# Patient Record
Sex: Male | Born: 1950 | Race: Black or African American | Hispanic: No | Marital: Married | Smoking: Never smoker
Health system: Southern US, Community
[De-identification: ages and names within clinical notes are randomized; demographics above are authoritative.]

## PROBLEM LIST (undated history)

## (undated) DIAGNOSIS — K219 Gastro-esophageal reflux disease without esophagitis: Secondary | ICD-10-CM

## (undated) DIAGNOSIS — D631 Anemia in chronic kidney disease: Secondary | ICD-10-CM

## (undated) DIAGNOSIS — G8929 Other chronic pain: Secondary | ICD-10-CM

## (undated) DIAGNOSIS — I5020 Unspecified systolic (congestive) heart failure: Secondary | ICD-10-CM

## (undated) DIAGNOSIS — N189 Chronic kidney disease, unspecified: Secondary | ICD-10-CM

## (undated) DIAGNOSIS — M549 Dorsalgia, unspecified: Secondary | ICD-10-CM

## (undated) DIAGNOSIS — I10 Essential (primary) hypertension: Secondary | ICD-10-CM

## (undated) DIAGNOSIS — N186 End stage renal disease: Secondary | ICD-10-CM

## (undated) DIAGNOSIS — M109 Gout, unspecified: Secondary | ICD-10-CM

## (undated) DIAGNOSIS — I509 Heart failure, unspecified: Secondary | ICD-10-CM

## (undated) DIAGNOSIS — M1990 Unspecified osteoarthritis, unspecified site: Secondary | ICD-10-CM

## (undated) HISTORY — DX: Unspecified systolic (congestive) heart failure: I50.20

## (undated) HISTORY — DX: End stage renal disease: N18.6

## (undated) HISTORY — DX: Anemia in chronic kidney disease: N18.9

## (undated) HISTORY — DX: Chronic kidney disease, unspecified: D63.1

---

## 1998-07-12 ENCOUNTER — Encounter: Payer: Self-pay | Admitting: Nephrology

## 1998-07-12 ENCOUNTER — Ambulatory Visit (HOSPITAL_COMMUNITY): Admission: RE | Admit: 1998-07-12 | Discharge: 1998-07-12 | Payer: Self-pay | Admitting: Nephrology

## 1998-09-05 ENCOUNTER — Ambulatory Visit (HOSPITAL_COMMUNITY): Admission: RE | Admit: 1998-09-05 | Discharge: 1998-09-05 | Payer: Self-pay | Admitting: Nephrology

## 1998-09-05 ENCOUNTER — Encounter: Payer: Self-pay | Admitting: Nephrology

## 1998-11-22 ENCOUNTER — Encounter: Payer: Self-pay | Admitting: Nephrology

## 1998-11-22 ENCOUNTER — Ambulatory Visit (HOSPITAL_COMMUNITY): Admission: RE | Admit: 1998-11-22 | Discharge: 1998-11-22 | Payer: Self-pay | Admitting: Nephrology

## 1999-01-03 ENCOUNTER — Ambulatory Visit (HOSPITAL_COMMUNITY): Admission: RE | Admit: 1999-01-03 | Discharge: 1999-01-03 | Payer: Self-pay | Admitting: Nephrology

## 1999-01-03 ENCOUNTER — Encounter: Payer: Self-pay | Admitting: Nephrology

## 1999-01-21 ENCOUNTER — Ambulatory Visit (HOSPITAL_COMMUNITY): Admission: RE | Admit: 1999-01-21 | Discharge: 1999-01-21 | Payer: Self-pay

## 1999-01-25 ENCOUNTER — Ambulatory Visit (HOSPITAL_COMMUNITY): Admission: RE | Admit: 1999-01-25 | Discharge: 1999-01-26 | Payer: Self-pay

## 1999-01-27 ENCOUNTER — Emergency Department (HOSPITAL_COMMUNITY): Admission: EM | Admit: 1999-01-27 | Discharge: 1999-01-27 | Payer: Self-pay | Admitting: Emergency Medicine

## 1999-05-03 ENCOUNTER — Encounter: Payer: Self-pay | Admitting: Nephrology

## 1999-05-03 ENCOUNTER — Ambulatory Visit (HOSPITAL_COMMUNITY): Admission: RE | Admit: 1999-05-03 | Discharge: 1999-05-03 | Payer: Self-pay | Admitting: Nephrology

## 1999-07-15 ENCOUNTER — Encounter: Payer: Self-pay | Admitting: Nephrology

## 1999-07-15 ENCOUNTER — Ambulatory Visit (HOSPITAL_COMMUNITY): Admission: RE | Admit: 1999-07-15 | Discharge: 1999-07-15 | Payer: Self-pay | Admitting: Nephrology

## 1999-07-17 ENCOUNTER — Ambulatory Visit (HOSPITAL_COMMUNITY): Admission: RE | Admit: 1999-07-17 | Discharge: 1999-07-17 | Payer: Self-pay | Admitting: Nephrology

## 1999-09-23 ENCOUNTER — Encounter: Admission: RE | Admit: 1999-09-23 | Discharge: 1999-09-23 | Payer: Self-pay | Admitting: Nephrology

## 1999-09-23 ENCOUNTER — Encounter: Payer: Self-pay | Admitting: Nephrology

## 2000-03-16 ENCOUNTER — Encounter: Payer: Self-pay | Admitting: Nephrology

## 2000-03-16 ENCOUNTER — Encounter: Admission: RE | Admit: 2000-03-16 | Discharge: 2000-03-16 | Payer: Self-pay | Admitting: Nephrology

## 2000-05-31 ENCOUNTER — Encounter: Payer: Self-pay | Admitting: Nephrology

## 2000-05-31 ENCOUNTER — Ambulatory Visit (HOSPITAL_COMMUNITY): Admission: RE | Admit: 2000-05-31 | Discharge: 2000-05-31 | Payer: Self-pay | Admitting: Nephrology

## 2001-01-08 ENCOUNTER — Encounter: Admission: RE | Admit: 2001-01-08 | Discharge: 2001-01-08 | Payer: Self-pay | Admitting: Nephrology

## 2001-01-08 ENCOUNTER — Encounter: Payer: Self-pay | Admitting: Nephrology

## 2002-01-12 ENCOUNTER — Encounter: Admission: RE | Admit: 2002-01-12 | Discharge: 2002-01-12 | Payer: Self-pay | Admitting: Nephrology

## 2002-01-12 ENCOUNTER — Encounter: Payer: Self-pay | Admitting: Nephrology

## 2002-06-21 ENCOUNTER — Encounter: Admission: RE | Admit: 2002-06-21 | Discharge: 2002-06-21 | Payer: Self-pay | Admitting: Nephrology

## 2002-06-21 ENCOUNTER — Encounter: Payer: Self-pay | Admitting: Nephrology

## 2004-10-09 ENCOUNTER — Encounter: Admission: RE | Admit: 2004-10-09 | Discharge: 2004-10-09 | Payer: Self-pay | Admitting: Nephrology

## 2004-10-09 IMAGING — CR DG CHEST 2V
2 series · 2 of 2 positions shown · non-contrast
Comparison: none

CLINICAL DATA: Short of breath.  
 CHEST X-RAY: 
 Two views of the chest are compared to a chest x-ray of [DATE].  The lungs are clear.  A nodular area in the left suprahilar region was present previously and appears to represent overlapping vasculature.  Mild cardiomegaly is stable.  There are degenerative changes throughout the thoracic spine.

[w chest pa]
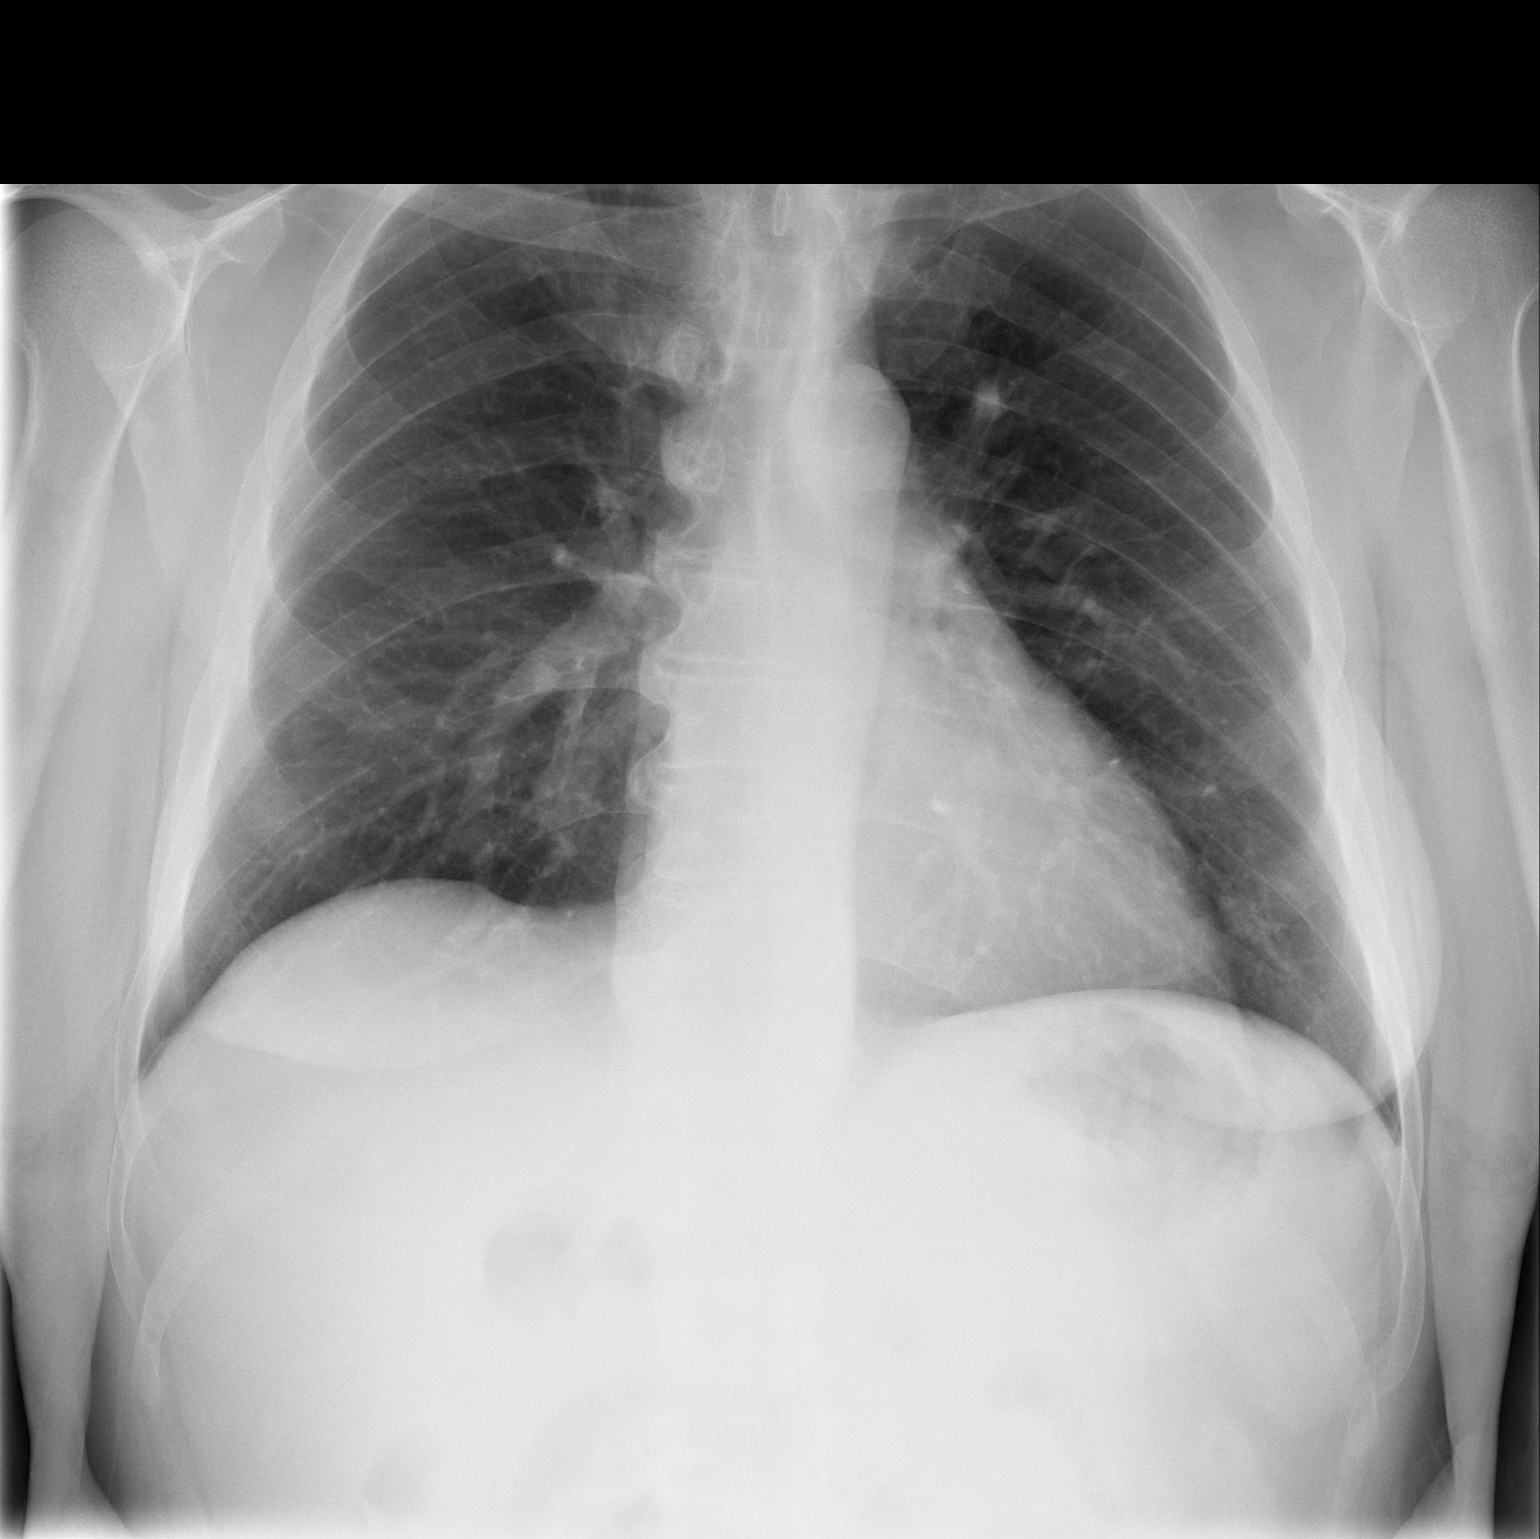

[w chest lat]
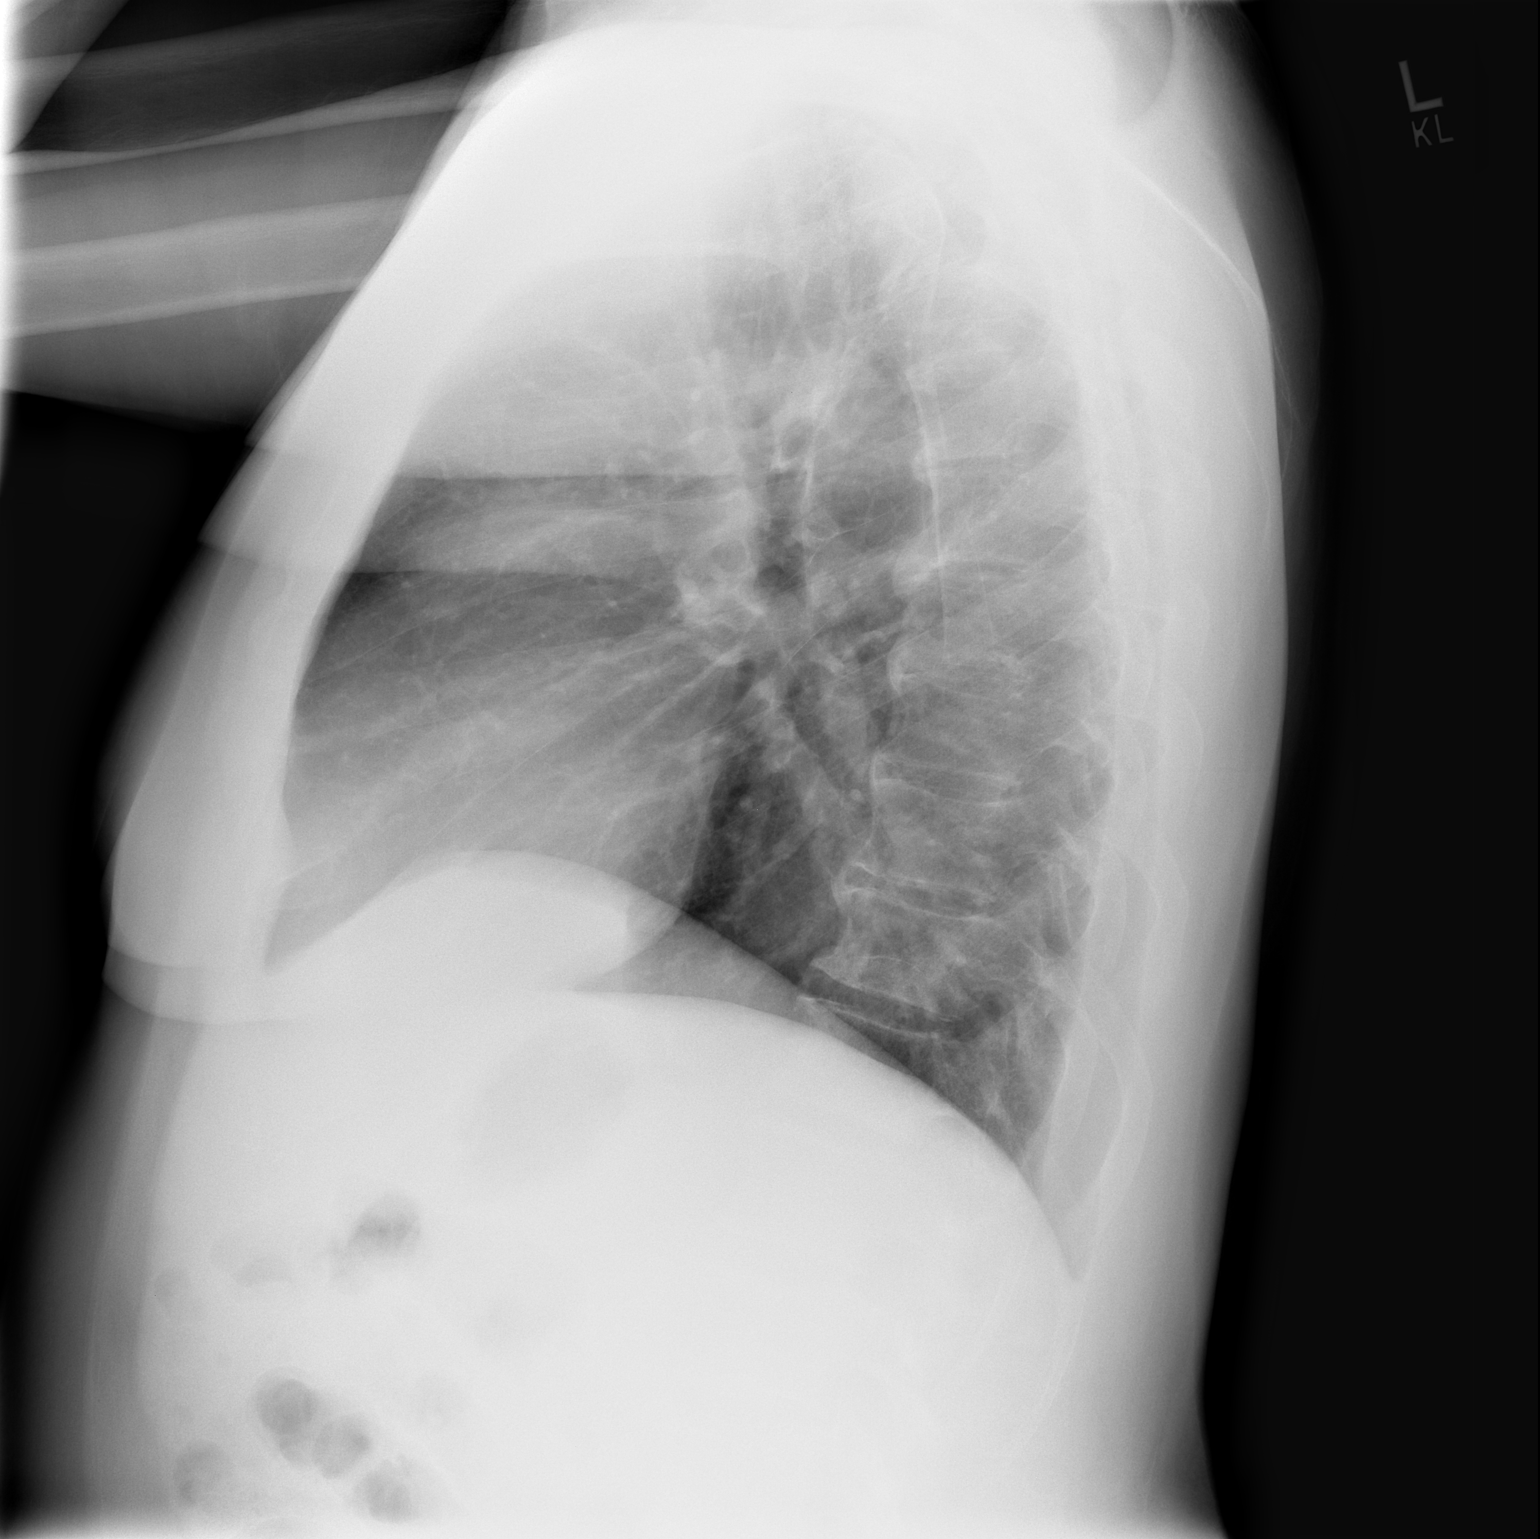

[2 of 2 positions shown; findings below may reference images not displayed]

IMPRESSION: Stable chest x-ray.  Mild cardiomegaly.  No active lung disease.

## 2005-08-08 ENCOUNTER — Ambulatory Visit (HOSPITAL_COMMUNITY): Admission: RE | Admit: 2005-08-08 | Discharge: 2005-08-08 | Payer: Self-pay | Admitting: Nephrology

## 2007-12-06 ENCOUNTER — Encounter: Admission: RE | Admit: 2007-12-06 | Discharge: 2007-12-06 | Payer: Self-pay | Admitting: Nephrology

## 2008-06-27 ENCOUNTER — Ambulatory Visit: Payer: Self-pay | Admitting: Vascular Surgery

## 2009-07-09 ENCOUNTER — Ambulatory Visit: Payer: Self-pay | Admitting: Surgery

## 2009-07-26 ENCOUNTER — Ambulatory Visit: Payer: Self-pay | Admitting: Vascular Surgery

## 2009-07-26 ENCOUNTER — Ambulatory Visit (HOSPITAL_COMMUNITY): Admission: RE | Admit: 2009-07-26 | Discharge: 2009-07-26 | Payer: Self-pay | Admitting: Surgery

## 2009-07-26 HISTORY — PX: AV FISTULA PLACEMENT: SHX1204

## 2009-08-20 ENCOUNTER — Ambulatory Visit: Payer: Self-pay | Admitting: Surgery

## 2009-09-10 ENCOUNTER — Ambulatory Visit: Payer: Self-pay | Admitting: Surgery

## 2010-05-26 ENCOUNTER — Encounter: Payer: Self-pay | Admitting: Nephrology

## 2010-07-29 LAB — POCT I-STAT 4, (NA,K, GLUC, HGB,HCT)
Glucose, Bld: 98 mg/dL (ref 70–99)
Hemoglobin: 17 g/dL (ref 13.0–17.0)

## 2010-09-17 NOTE — Assessment & Plan Note (Signed)
OFFICE VISIT   TRI, NOWLING  DOB:  [birth year]                                       08/20/2009  [id number]   REASON FOR VISIT:  Follow-up.   HISTORY:  The patient is a 60 year old gentleman who underwent right  upper arm fistula by Dr. Donnetta Hutching on July 26, 2009.  He comes today for  first follow-up.  He is complaining of some symptoms at night related to  the coldness and pain which can wake him up.   On examination his vein is a very dilated.  There is excellent thrill  within it.  He has palpable radial pulse.   ASSESSMENT:  Mild right arm steal.   PLAN:  I have encouraged the patient to use exercise ball and we will  follow him and see how it looks in 3 weeks.  He will come back sooner if  he has any other problems.     Eldridge Abrahams, MD  Electronically Signed   VWB/MEDQ  D:  08/20/2009  T:  08/21/2009  Job:  [id number]

## 2010-09-17 NOTE — Assessment & Plan Note (Signed)
OFFICE VISIT   Gregory [name], Gregory [name]  DOB:  08-14-1950                                       07/09/2009  [id number]   REASON FOR VISIT:  Dialysis access.   REFERRING PHYSICIAN:  Dr. Lorrene Reid.   Patient is a 60 year old right-handed gentleman, not yet on dialysis  with chronic renal insufficiency who is sent to my office for evaluation  of permanent access, and he anticipates starting dialysis in the near  future.  The patient's renal failure is secondary to hypertension.  This  has been medically managed and has been under good control recently.  He  also suffers from hyperlipidemia, secondary hyperparathyroidism, status  post parathyroidectomy, gout, all of which are medically managed.   Review of systems is only positive for arthritis and joint pain.  All  others negative, as documented in the encounter form.   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   FAMILY HISTORY:  Negative for cardiovascular disease at an early age.   SOCIAL HISTORY:  Married with 2 children.  He is retired.  Does not  smoke, does not drink.   ALLERGIES:  None.   PHYSICAL EXAMINATION:  VITAL SIGNS:  Heart rate 57, blood pressure  131/82, temperature is 98.2.  Generally, he is well-appearing in no  distress.  HEENT:  Within normal limits.  Lungs are clear bilaterally.  Cardiovascular:  Regular rate and rhythm.  No murmurs.  Abdomen is soft.  Musculoskeletal is without major deformity.  Neuro is nonfocal.  Skin is  without rash.   DIAGNOSTICS:  Vein mapping was performed today, which has been  independently reviewed.   ASSESSMENT:  Chronic kidney disease, needing permanent access.   PLAN:  Based on the patient's vein mapping, he is a candidate for a  right upper arm fistula.  I have discussed the  risks of non maturity and  the risks of steal with the patient.  He understands these risks.  We  have scheduled his operation for Thursday, March 24th.     Eldridge Abrahams, MD  Electronically Signed   VWB/MEDQ  D:  07/09/2009  T:  07/10/2009  Job:  [id number]   cc:   Elzie Rings. Lorrene Reid, M.D.

## 2010-09-17 NOTE — Consult Note (Signed)
NEW PATIENT CONSULTATION   [name], [name]  DOB:  02/23/1951                                       06/27/2008  [id number]   I saw the patient in the office today in consultation concerning  bilateral common iliac artery aneurysms.  He was referred by Dr. Lorrene Reid.  This is a pleasant 60 year old gentleman with a history of chronic  kidney disease.  He is followed by Dr. Lorrene Reid.  He is not on dialysis.  He had an abdominal ultrasound in June of 2009 to evaluate his renal  arteries and an incidental finding was mildly dilated iliac arteries.  Right iliac measured 1.7 cm in maximum diameter and the left iliac  measured 1.9 cm in maximum diameter.  The renal arteries had normal  patency.  Of note, he is unaware of any family history of aneurysmal  disease.  He has had no significant abdominal pain.  He does have some  chronic low back pain.  In addition, he has had leg pain for years that  is really not changed in character.  He described a burning and stabbing  pain all over his thighs and calves.  This pain oftentimes occurs simply  with standing but does not occur every day.  I do not get any clear-cut  history of claudication, rest pain or nonhealing ulcers.  The only  alleviating factor associated with his pain is taking pain medicines.  He does feel that walking does sometimes aggravate his pain.   PAST MEDICAL HISTORY:  Is significant for chronic kidney disease.  In  addition, he has hypertension, hypercholesterolemia, secondary  hyperparathyroidism, a history of gout with gouty arthritis.  He denies  any previous history of myocardial infarction, history of congestive  heart failure or history of COPD.   FAMILY HISTORY:  There is no history of aneurysmal disease or premature  cardiovascular disease.   SOCIAL HISTORY:  He is married.  He has two children.  He is retired.  He does not use tobacco.   REVIEW OF SYSTEMS AND MEDICATIONS:  Are documented  on the medical  history form in his chart.   PHYSICAL EXAMINATION:  General:  This is a pleasant 60 year old  gentleman who appears his stated age.  Vital signs:  Blood pressure is  183/133, heart rate is 90.  HEENT:  Is unremarkable.  Neck:  Is supple.  There is no cervical lymphadenopathy.  I do not detect any carotid  bruits.  Lungs:  Are clear bilaterally to auscultation.  Cardiac:  He  has a regular rate and rhythm.  Abdomen:  Soft and nontender.  His aorta  is palpable and does not appear especially enlarged.  He has palpable  femoral, popliteal, dorsalis pedis and posterior tibial pulses  bilaterally.  He has no evidence of atheroembolic disease.  He has no  significant lower extremity swelling.  Neurological:  Exam is nonfocal.   Duplex scan in our office today shows no evidence of an abdominal aortic  aneurysm.  His right iliac artery is mildly dilated at 2.1 cm and the  left iliac is 1.9 cm.  Thus his iliacs have not changed significantly in  size since June of 2009.   I have explained we generally would not consider elective repair of an  iliac aneurysm unless it was least 3 to 3.5 cm in  maximum diameter.  I  have simply recommended followup.   I have recommended a followup ultrasound in 6 months and will continue  to follow these small aneurysms.  If the aneurysms do begin to enlarge  then a CT scan without contrast might help further evaluate the  aneurysms.  However, currently they are quite small and I do not think a  more aggressive workup is indicated at this time.  These are  asymptomatic.  He has no evidence of atheroembolic disease.  I did  explain to him that I did not think his leg pain was related to a  vascular problem as he has excellent pulses.  He does have significant  back pain and if this becomes more of a problem perhaps neurologic  evaluation would be helpful.  I will see him back in 6 months.  He knows  to call sooner if he has problems.    Judeth Cornfield. Scot Dock, M.D.  Electronically Signed   CSD/MEDQ  D:  06/27/2008  T:  06/28/2008  Job:  [id number]   cc:   Elzie Rings. Lorrene Reid, M.D.

## 2010-09-17 NOTE — Procedures (Signed)
CEPHALIC VEIN MAPPING   INDICATION:  Evaluation for AVF or AVG placement.   HISTORY:  Renal failure   EXAM:  The right cephalic vein is compressible.  Diameter measurements range  from 0.14 to 0.31 cm.   The right basilic vein is compressible.  Diameter measurements range  from 0.16 to 0.26 cm in the upper arm only.   The left cephalic vein is compressible.  Diameter measurements range  from 0.11 to 0.21 cm.   The left basilic vein is compressible.  Diameter measurements range from  0.21 to 0.37 cm in the upper arm only.   See attached worksheet for all measurements.   IMPRESSION:  The patient's bilateral basilic and cephalic veins are of  questionable diameter for use as dialysis access sites.     ___________________________________________  V. Leia Alf, MD   CJ/MEDQ  D:  07/09/2009  T:  07/09/2009  Job:  [id number]

## 2010-09-17 NOTE — Assessment & Plan Note (Signed)
OFFICE VISIT   CHESTER, COUPLAND  DOB:  [birth year]                                       09/10/2009  [id number]   The patient is a 60 year old gentleman who underwent right upper arm  fistula by Dr. Donnetta Hutching on March 24.  When I saw him at his first follow-up  visit, he had some pain symptoms at night that did wake him up.  He did  not have a significant strength discrepancy and he had good function of  his right hand.  There was an excellent thrill within his fistula and  had a palpable radial pulse..  I had encouraged that he use an exercise  ball and see me back today.  Today he says that the pain is worse, that  he is having to take aspirin for it which gives him mild to minimal  relief.  It does keep him up occasionally at night.  His grip strength  in his right hand is significantly decreased.  The right hand is cooler  than the left.  He does continue to have a palpable radial pulse.  There  is a very strong thrill within his fistula.   I spent approximately 20 minutes detailing the significance and severity  of this problem to the patient.  We discussed proceeding with 1 of 3  options including ligation of his fistula, banding of the proximal  anastomosis and a DRIL procedure.  The patient told me that he does not  like hospitals and that he is very reluctant to have anything done at  this time.  I have reiterated that this could potentially result in  permanent loss of function in his hand.  He is still somewhat reluctant  to proceed.  He wants to discuss  this at home with his wife.  I told  him again and again that this may result in a permanent disability in  his hand and that we need to  address this issue this week.  I am going  to get an ultrasound to document his steal syndrome and I will contact  him to make sure that we get an operation scheduled.     Eldridge Abrahams, MD  Electronically Signed   VWB/MEDQ  D:  09/10/2009  T:   09/11/2009  Job:  [id number]   cc:   Elzie Rings. Lorrene Reid, M.D.

## 2010-09-17 NOTE — Procedures (Signed)
VASCULAR LAB EXAM   INDICATION:  Possible right AVF steal.  The patient complains of right  hand numbness.   HISTORY:  Diabetes.  No.  Cardiac:  No.  Hypertension:  No.   EXAM:  Right AV fistula duplex.   IMPRESSION:  1. Patent right brachiocephalic arteriovenous fistula with an      increased velocity of 435 cm/s noted at the mid brachium level      outflow vein.  This appears to be due to a valve leaflet.  2. Patent branch of the right outflow vein at the distal brachium      level measuring 0.53 cm.  3. Doppler velocities of the right forearm arteries, with and without      fistula compression, suggests possible significant fistula steal.      Dampened right upper extremity arterial waveforms noted distal to      the anastomosis level.      ___________________________________________  V. Leia Alf, MD   CH/MEDQ  D:  09/10/2009  T:  09/10/2009  Job:  [id number]

## 2010-09-17 NOTE — Procedures (Signed)
DUPLEX ULTRASOUND OF ABDOMINAL AORTA   INDICATION:  Follow-up evaluation of bilateral iliac artery aneurysms.   HISTORY:  Diabetes:  No.  Cardiac:  No.  Hypertension:  No.  Smoking:  No.  Connective Tissue Disorder:  Family History:  Previous Surgery:  On October 04, 2007, duplex revealed the right iliac  artery to measure 1.7 cm x 1.6 cm and the left iliac artery measured 1.9  cm x 1.5 cm.   DUPLEX EXAM:         AP (cm)                   TRANSVERSE (cm)  Proximal             1.5 cm                    1.9 cm  Mid                  2.0 cm                    2.1 cm  Distal               1.9 cm                    2.5 cm  Right Iliac          2.1 cm                    1.9 cm  Left Iliac           1.8 cm                    1.9 cm   PREVIOUS:  Date:  AP:  TRANSVERSE:   IMPRESSION:  1. Bilateral common iliac artery aneurysms.  2. No evidence of significant aortic dilation.  3. No evidence of aortic thrombus or occlusive disease.  4. The left common iliac artery is dilated for approximately 10 cm of      length.  5. The right common iliac artery is dilated for approximately 8 cm of      length.   ___________________________________________  Judeth Cornfield. Scot Dock, M.D.   MC/MEDQ  D:  06/27/2008  T:  06/27/2008  Job:  [id number]

## 2011-05-09 DIAGNOSIS — R809 Proteinuria, unspecified: Secondary | ICD-10-CM | POA: Diagnosis not present

## 2011-05-16 DIAGNOSIS — M2540 Effusion, unspecified joint: Secondary | ICD-10-CM | POA: Diagnosis not present

## 2011-05-16 DIAGNOSIS — R809 Proteinuria, unspecified: Secondary | ICD-10-CM | POA: Diagnosis not present

## 2011-05-29 DIAGNOSIS — N184 Chronic kidney disease, stage 4 (severe): Secondary | ICD-10-CM | POA: Diagnosis not present

## 2011-07-01 DIAGNOSIS — R809 Proteinuria, unspecified: Secondary | ICD-10-CM | POA: Diagnosis not present

## 2011-07-01 DIAGNOSIS — I10 Essential (primary) hypertension: Secondary | ICD-10-CM | POA: Diagnosis not present

## 2011-07-03 ENCOUNTER — Other Ambulatory Visit: Payer: Self-pay | Admitting: Nephrology

## 2011-07-03 DIAGNOSIS — R809 Proteinuria, unspecified: Secondary | ICD-10-CM

## 2011-07-03 DIAGNOSIS — I10 Essential (primary) hypertension: Secondary | ICD-10-CM

## 2011-07-07 ENCOUNTER — Other Ambulatory Visit: Payer: Self-pay

## 2011-07-07 ENCOUNTER — Ambulatory Visit
Admission: RE | Admit: 2011-07-07 | Discharge: 2011-07-07 | Disposition: A | Discharge status: Home / Self Care | Payer: Medicare Other | Source: Ambulatory Visit | Attending: Nephrology | Admitting: Nephrology

## 2011-07-07 DIAGNOSIS — R809 Proteinuria, unspecified: Secondary | ICD-10-CM

## 2011-07-07 DIAGNOSIS — I10 Essential (primary) hypertension: Secondary | ICD-10-CM

## 2011-07-07 DIAGNOSIS — N189 Chronic kidney disease, unspecified: Secondary | ICD-10-CM | POA: Diagnosis not present

## 2011-07-07 DIAGNOSIS — N183 Chronic kidney disease, stage 3 unspecified: Secondary | ICD-10-CM

## 2011-07-07 DIAGNOSIS — N281 Cyst of kidney, acquired: Secondary | ICD-10-CM | POA: Diagnosis not present

## 2011-08-26 DIAGNOSIS — I10 Essential (primary) hypertension: Secondary | ICD-10-CM | POA: Diagnosis not present

## 2011-08-26 DIAGNOSIS — N184 Chronic kidney disease, stage 4 (severe): Secondary | ICD-10-CM | POA: Diagnosis not present

## 2011-08-26 DIAGNOSIS — I129 Hypertensive chronic kidney disease with stage 1 through stage 4 chronic kidney disease, or unspecified chronic kidney disease: Secondary | ICD-10-CM | POA: Diagnosis not present

## 2011-08-26 DIAGNOSIS — N2581 Secondary hyperparathyroidism of renal origin: Secondary | ICD-10-CM | POA: Diagnosis not present

## 2011-08-26 DIAGNOSIS — R809 Proteinuria, unspecified: Secondary | ICD-10-CM | POA: Diagnosis not present

## 2011-08-26 DIAGNOSIS — N049 Nephrotic syndrome with unspecified morphologic changes: Secondary | ICD-10-CM | POA: Diagnosis not present

## 2011-10-22 DIAGNOSIS — E785 Hyperlipidemia, unspecified: Secondary | ICD-10-CM | POA: Diagnosis not present

## 2011-10-22 DIAGNOSIS — N2581 Secondary hyperparathyroidism of renal origin: Secondary | ICD-10-CM | POA: Diagnosis not present

## 2011-10-22 DIAGNOSIS — I10 Essential (primary) hypertension: Secondary | ICD-10-CM | POA: Diagnosis not present

## 2011-10-22 DIAGNOSIS — R809 Proteinuria, unspecified: Secondary | ICD-10-CM | POA: Diagnosis not present

## 2011-10-22 DIAGNOSIS — N184 Chronic kidney disease, stage 4 (severe): Secondary | ICD-10-CM | POA: Diagnosis not present

## 2011-12-31 DIAGNOSIS — I129 Hypertensive chronic kidney disease with stage 1 through stage 4 chronic kidney disease, or unspecified chronic kidney disease: Secondary | ICD-10-CM | POA: Diagnosis not present

## 2011-12-31 DIAGNOSIS — I10 Essential (primary) hypertension: Secondary | ICD-10-CM | POA: Diagnosis not present

## 2011-12-31 DIAGNOSIS — R809 Proteinuria, unspecified: Secondary | ICD-10-CM | POA: Diagnosis not present

## 2011-12-31 DIAGNOSIS — D869 Sarcoidosis, unspecified: Secondary | ICD-10-CM | POA: Diagnosis not present

## 2011-12-31 DIAGNOSIS — N184 Chronic kidney disease, stage 4 (severe): Secondary | ICD-10-CM | POA: Diagnosis not present

## 2012-03-03 DIAGNOSIS — I10 Essential (primary) hypertension: Secondary | ICD-10-CM | POA: Diagnosis not present

## 2012-03-03 DIAGNOSIS — N2581 Secondary hyperparathyroidism of renal origin: Secondary | ICD-10-CM | POA: Diagnosis not present

## 2012-03-03 DIAGNOSIS — N185 Chronic kidney disease, stage 5: Secondary | ICD-10-CM | POA: Diagnosis not present

## 2012-03-03 DIAGNOSIS — I120 Hypertensive chronic kidney disease with stage 5 chronic kidney disease or end stage renal disease: Secondary | ICD-10-CM | POA: Diagnosis not present

## 2012-03-03 DIAGNOSIS — Z23 Encounter for immunization: Secondary | ICD-10-CM | POA: Diagnosis not present

## 2012-06-04 DIAGNOSIS — N184 Chronic kidney disease, stage 4 (severe): Secondary | ICD-10-CM | POA: Diagnosis not present

## 2012-06-04 DIAGNOSIS — R809 Proteinuria, unspecified: Secondary | ICD-10-CM | POA: Diagnosis not present

## 2012-06-04 DIAGNOSIS — I129 Hypertensive chronic kidney disease with stage 1 through stage 4 chronic kidney disease, or unspecified chronic kidney disease: Secondary | ICD-10-CM | POA: Diagnosis not present

## 2012-06-04 DIAGNOSIS — I10 Essential (primary) hypertension: Secondary | ICD-10-CM | POA: Diagnosis not present

## 2012-06-04 DIAGNOSIS — N049 Nephrotic syndrome with unspecified morphologic changes: Secondary | ICD-10-CM | POA: Diagnosis not present

## 2012-07-30 DIAGNOSIS — N184 Chronic kidney disease, stage 4 (severe): Secondary | ICD-10-CM | POA: Diagnosis not present

## 2012-07-30 DIAGNOSIS — N2581 Secondary hyperparathyroidism of renal origin: Secondary | ICD-10-CM | POA: Diagnosis not present

## 2012-07-30 DIAGNOSIS — N390 Urinary tract infection, site not specified: Secondary | ICD-10-CM | POA: Diagnosis not present

## 2012-08-03 DIAGNOSIS — N2581 Secondary hyperparathyroidism of renal origin: Secondary | ICD-10-CM | POA: Diagnosis not present

## 2012-08-03 DIAGNOSIS — R197 Diarrhea, unspecified: Secondary | ICD-10-CM | POA: Diagnosis not present

## 2012-08-03 DIAGNOSIS — N185 Chronic kidney disease, stage 5: Secondary | ICD-10-CM | POA: Diagnosis not present

## 2012-08-25 DIAGNOSIS — I10 Essential (primary) hypertension: Secondary | ICD-10-CM | POA: Diagnosis not present

## 2012-08-25 DIAGNOSIS — N049 Nephrotic syndrome with unspecified morphologic changes: Secondary | ICD-10-CM | POA: Diagnosis not present

## 2012-08-25 DIAGNOSIS — I120 Hypertensive chronic kidney disease with stage 5 chronic kidney disease or end stage renal disease: Secondary | ICD-10-CM | POA: Diagnosis not present

## 2012-08-25 DIAGNOSIS — N185 Chronic kidney disease, stage 5: Secondary | ICD-10-CM | POA: Diagnosis not present

## 2012-12-11 ENCOUNTER — Inpatient Hospital Stay (HOSPITAL_COMMUNITY)
Admission: EM | Admit: 2012-12-11 | Discharge: 2012-12-20 | DRG: 682 | Disposition: A | Discharge status: Skilled Nursing Facility | Payer: Medicare Other | Attending: Family Medicine | Admitting: Family Medicine

## 2012-12-11 ENCOUNTER — Encounter (HOSPITAL_COMMUNITY): Payer: Self-pay | Admitting: Emergency Medicine

## 2012-12-11 ENCOUNTER — Emergency Department (HOSPITAL_COMMUNITY): Payer: Medicare Other

## 2012-12-11 DIAGNOSIS — E872 Acidosis, unspecified: Secondary | ICD-10-CM | POA: Diagnosis present

## 2012-12-11 DIAGNOSIS — N390 Urinary tract infection, site not specified: Secondary | ICD-10-CM | POA: Diagnosis present

## 2012-12-11 DIAGNOSIS — N185 Chronic kidney disease, stage 5: Secondary | ICD-10-CM | POA: Diagnosis not present

## 2012-12-11 DIAGNOSIS — I5040 Unspecified combined systolic (congestive) and diastolic (congestive) heart failure: Secondary | ICD-10-CM | POA: Diagnosis present

## 2012-12-11 DIAGNOSIS — R601 Generalized edema: Secondary | ICD-10-CM | POA: Diagnosis present

## 2012-12-11 DIAGNOSIS — M545 Low back pain, unspecified: Secondary | ICD-10-CM | POA: Diagnosis present

## 2012-12-11 DIAGNOSIS — L89309 Pressure ulcer of unspecified buttock, unspecified stage: Secondary | ICD-10-CM | POA: Diagnosis present

## 2012-12-11 DIAGNOSIS — D7582 Heparin induced thrombocytopenia (HIT): Secondary | ICD-10-CM | POA: Diagnosis present

## 2012-12-11 DIAGNOSIS — J90 Pleural effusion, not elsewhere classified: Secondary | ICD-10-CM | POA: Diagnosis not present

## 2012-12-11 DIAGNOSIS — B9689 Other specified bacterial agents as the cause of diseases classified elsewhere: Secondary | ICD-10-CM | POA: Diagnosis present

## 2012-12-11 DIAGNOSIS — R609 Edema, unspecified: Secondary | ICD-10-CM | POA: Diagnosis not present

## 2012-12-11 DIAGNOSIS — L89303 Pressure ulcer of unspecified buttock, stage 3: Secondary | ICD-10-CM | POA: Clinically undetermined

## 2012-12-11 DIAGNOSIS — I120 Hypertensive chronic kidney disease with stage 5 chronic kidney disease or end stage renal disease: Secondary | ICD-10-CM | POA: Diagnosis not present

## 2012-12-11 DIAGNOSIS — N2581 Secondary hyperparathyroidism of renal origin: Secondary | ICD-10-CM | POA: Diagnosis present

## 2012-12-11 DIAGNOSIS — N189 Chronic kidney disease, unspecified: Secondary | ICD-10-CM | POA: Diagnosis not present

## 2012-12-11 DIAGNOSIS — R262 Difficulty in walking, not elsewhere classified: Secondary | ICD-10-CM | POA: Diagnosis not present

## 2012-12-11 DIAGNOSIS — I5043 Acute on chronic combined systolic (congestive) and diastolic (congestive) heart failure: Secondary | ICD-10-CM | POA: Diagnosis not present

## 2012-12-11 DIAGNOSIS — D649 Anemia, unspecified: Secondary | ICD-10-CM | POA: Diagnosis present

## 2012-12-11 DIAGNOSIS — I428 Other cardiomyopathies: Secondary | ICD-10-CM | POA: Clinically undetermined

## 2012-12-11 DIAGNOSIS — Z91199 Patient's noncompliance with other medical treatment and regimen due to unspecified reason: Secondary | ICD-10-CM

## 2012-12-11 DIAGNOSIS — I509 Heart failure, unspecified: Secondary | ICD-10-CM | POA: Diagnosis present

## 2012-12-11 DIAGNOSIS — I10 Essential (primary) hypertension: Secondary | ICD-10-CM | POA: Diagnosis present

## 2012-12-11 DIAGNOSIS — IMO0002 Reserved for concepts with insufficient information to code with codable children: Secondary | ICD-10-CM | POA: Diagnosis not present

## 2012-12-11 DIAGNOSIS — Z992 Dependence on renal dialysis: Secondary | ICD-10-CM

## 2012-12-11 DIAGNOSIS — N186 End stage renal disease: Secondary | ICD-10-CM | POA: Diagnosis present

## 2012-12-11 DIAGNOSIS — E875 Hyperkalemia: Secondary | ICD-10-CM | POA: Diagnosis present

## 2012-12-11 DIAGNOSIS — R5381 Other malaise: Secondary | ICD-10-CM | POA: Diagnosis not present

## 2012-12-11 DIAGNOSIS — R6889 Other general symptoms and signs: Secondary | ICD-10-CM | POA: Diagnosis not present

## 2012-12-11 DIAGNOSIS — M549 Dorsalgia, unspecified: Secondary | ICD-10-CM | POA: Diagnosis present

## 2012-12-11 DIAGNOSIS — D320 Benign neoplasm of cerebral meninges: Secondary | ICD-10-CM | POA: Diagnosis not present

## 2012-12-11 DIAGNOSIS — Z9119 Patient's noncompliance with other medical treatment and regimen: Secondary | ICD-10-CM | POA: Diagnosis not present

## 2012-12-11 DIAGNOSIS — D75829 Heparin-induced thrombocytopenia, unspecified: Secondary | ICD-10-CM | POA: Diagnosis present

## 2012-12-11 DIAGNOSIS — M6281 Muscle weakness (generalized): Secondary | ICD-10-CM | POA: Diagnosis not present

## 2012-12-11 DIAGNOSIS — G3184 Mild cognitive impairment, so stated: Secondary | ICD-10-CM | POA: Diagnosis not present

## 2012-12-11 DIAGNOSIS — L8993 Pressure ulcer of unspecified site, stage 3: Secondary | ICD-10-CM | POA: Diagnosis not present

## 2012-12-11 DIAGNOSIS — Z789 Other specified health status: Secondary | ICD-10-CM | POA: Diagnosis present

## 2012-12-11 DIAGNOSIS — B351 Tinea unguium: Secondary | ICD-10-CM | POA: Diagnosis not present

## 2012-12-11 DIAGNOSIS — N19 Unspecified kidney failure: Secondary | ICD-10-CM | POA: Diagnosis not present

## 2012-12-11 DIAGNOSIS — Z79899 Other long term (current) drug therapy: Secondary | ICD-10-CM

## 2012-12-11 DIAGNOSIS — G20 Parkinson's disease: Secondary | ICD-10-CM | POA: Diagnosis not present

## 2012-12-11 HISTORY — DX: Essential (primary) hypertension: I10

## 2012-12-11 HISTORY — DX: Other chronic pain: G89.29

## 2012-12-11 HISTORY — DX: Unspecified osteoarthritis, unspecified site: M19.90

## 2012-12-11 HISTORY — DX: Dorsalgia, unspecified: M54.9

## 2012-12-11 HISTORY — DX: Heart failure, unspecified: I50.9

## 2012-12-11 LAB — COMPREHENSIVE METABOLIC PANEL
ALT: 17 U/L (ref 0–53)
Alkaline Phosphatase: 102 U/L (ref 39–117)
BUN: 108 mg/dL — ABNORMAL HIGH (ref 6–23)
CO2: 14 mEq/L — ABNORMAL LOW (ref 19–32)
Chloride: 106 mEq/L (ref 96–112)
GFR calc Af Amer: 6 mL/min — ABNORMAL LOW (ref >90)
GFR calc non Af Amer: 5 mL/min — ABNORMAL LOW (ref >90)
Glucose, Bld: 87 mg/dL (ref 70–99)
Potassium: 4.9 mEq/L (ref 3.5–5.1)
Total Bilirubin: 0.6 mg/dL (ref 0.3–1.2)
Total Protein: 5.2 g/dL — ABNORMAL LOW (ref 6.0–8.3)

## 2012-12-11 LAB — URINALYSIS, ROUTINE W REFLEX MICROSCOPIC
Bilirubin Urine: NEGATIVE
Ketones, ur: NEGATIVE mg/dL
Nitrite: NEGATIVE
Protein, ur: >300 mg/dL — AB

## 2012-12-11 LAB — CBC
HCT: 35.7 % — ABNORMAL LOW (ref 39.0–52.0)
Hemoglobin: 11.9 g/dL — ABNORMAL LOW (ref 13.0–17.0)
MCHC: 33.3 g/dL (ref 30.0–36.0)
RBC: 3.99 MIL/uL — ABNORMAL LOW (ref 4.22–5.81)

## 2012-12-11 LAB — URINE MICROSCOPIC-ADD ON

## 2012-12-11 MED ORDER — SODIUM CHLORIDE 0.9 % IJ SOLN: 3.0000 mL | INTRAMUSCULAR | Status: DC | PRN

## 2012-12-11 MED ORDER — CEPHALEXIN 500 MG PO CAPS: 500.0000 mg | ORAL_CAPSULE | Freq: Four times a day (QID) | ORAL | Status: DC

## 2012-12-11 MED ORDER — DEXTROSE 5 % IV SOLN
1.0000 g | Freq: Once | INTRAVENOUS | Status: AC
  Administered 2012-12-11: 1 g via INTRAVENOUS
  Filled 2012-12-11: qty 10

## 2012-12-11 MED ORDER — DEXTROSE 5 % IV SOLN
500.0000 mg | Freq: Once | INTRAVENOUS | Status: AC
  Administered 2012-12-11: 500 mg via INTRAVENOUS
  Filled 2012-12-11: qty 500

## 2012-12-11 MED ORDER — HEPARIN SODIUM (PORCINE) 5000 UNIT/ML IJ SOLN
5000.0000 [IU] | Freq: Three times a day (TID) | INTRAMUSCULAR | Status: DC
  Administered 2012-12-11 – 2012-12-16 (×13): 5000 [IU] via SUBCUTANEOUS
  Filled 2012-12-11 (×17): qty 1

## 2012-12-11 MED ORDER — PREDNISONE 5 MG PO TABS
5.0000 mg | ORAL_TABLET | Freq: Every day | ORAL | Status: DC
  Administered 2012-12-12 – 2012-12-20 (×9): 5 mg via ORAL
  Filled 2012-12-11 (×9): qty 1

## 2012-12-11 MED ORDER — DEXTROSE 5 % IV SOLN
1.0000 g | INTRAVENOUS | Status: DC
  Filled 2012-12-11: qty 10

## 2012-12-11 MED ORDER — SODIUM CHLORIDE 0.9 % IV SOLN: 250.0000 mL | INTRAVENOUS | Status: DC | PRN

## 2012-12-11 MED ORDER — OXYCODONE-ACETAMINOPHEN 7.5-325 MG PO TABS: 1.0000 | ORAL_TABLET | Freq: Two times a day (BID) | ORAL | Status: DC | PRN

## 2012-12-11 MED ORDER — POLYETHYLENE GLYCOL 3350 17 G PO PACK
17.0000 g | PACK | Freq: Every day | ORAL | Status: DC | PRN
  Filled 2012-12-11: qty 1

## 2012-12-11 MED ORDER — ATENOLOL 50 MG PO TABS
150.0000 mg | ORAL_TABLET | Freq: Every day | ORAL | Status: DC
  Administered 2012-12-12 – 2012-12-20 (×9): 150 mg via ORAL
  Filled 2012-12-11 (×9): qty 1

## 2012-12-11 MED ORDER — SODIUM CHLORIDE 0.9 % IJ SOLN
3.0000 mL | Freq: Two times a day (BID) | INTRAMUSCULAR | Status: DC
Start: 2012-12-11 — End: 2012-12-20
  Administered 2012-12-11 – 2012-12-20 (×17): 3 mL via INTRAVENOUS

## 2012-12-11 MED ORDER — OXYCODONE-ACETAMINOPHEN 5-325 MG PO TABS
1.0000 | ORAL_TABLET | Freq: Two times a day (BID) | ORAL | Status: DC | PRN
  Administered 2012-12-13: 1 via ORAL
  Filled 2012-12-11: qty 1

## 2012-12-11 MED ORDER — FUROSEMIDE 10 MG/ML IJ SOLN
80.0000 mg | Freq: Once | INTRAMUSCULAR | Status: AC
  Administered 2012-12-11: 80 mg via INTRAVENOUS
  Filled 2012-12-11: qty 8

## 2012-12-11 MED ORDER — CALCITRIOL 0.5 MCG PO CAPS
1.0000 ug | ORAL_CAPSULE | Freq: Two times a day (BID) | ORAL | Status: DC
  Administered 2012-12-11 – 2012-12-13 (×5): 1 ug via ORAL
  Filled 2012-12-11 (×7): qty 2

## 2012-12-11 NOTE — Progress Notes (Signed)
[id number] 12/11/12 nsg Report called at 1800. Pt arrived on the floor at 1833. To unit 6700 per stretcher accompanied by RN alert and oriented patient. Noted generalized edema no skin issues noted. Pt needs 2 assist when transferring from bed to chair. Oriented to unit set up . Pt is a high fall risk. Report to incoming RN.

## 2012-12-11 NOTE — ED Provider Notes (Addendum)
Gregory [name] is a 62 y.o. male  Who is here for evaluation of generalized swelling. He has been resistant to receiving medical care. He has not following up regularly with his nephrologist. He does not have a PCP. He came here reluctantly for evaluation. He has had frequent stools, but no vomiting. He continues to urinate.  Past medical history:  renal insufficiency, with fistula placed for possible dialysis in the future. Head normocephalic and atraumatic. There is generalized pitting edema, consistent with anasarca. Respiratory, normal effort. Neurologic grossly nonfocal. He is alert and oriented x3  Assessment renal insufficiency with anasarca and hypoproteinemia. He is being admitted for treatment and stabilization. Initially he refused to be admitted, then relented and agreed to stay.  Medical screening examination/treatment/procedure(s) were conducted as a shared visit with non-physician practitioner(s) and myself.  I personally evaluated the patient during the encounter  Richarda Blade, MD 12/11/12 [place], MD 12/11/12 [id number]

## 2012-12-11 NOTE — ED Notes (Signed)
Family at bedside. 

## 2012-12-11 NOTE — ED Notes (Signed)
MD at bedside. 

## 2012-12-11 NOTE — ED Notes (Signed)
Pt reports he didn't want to come, his wife called EMS. Wife reports that she called because he wasn't able to get up on his own any more, she cant help him and wasn't making it to the restroom on his own. Wife reports that he isnt able to walk and becomes SOB. Pt denies SOB, denies urinary symptoms. sts he does have a lot of swelling in his legs and arms. sts he was supposed to start in April but never went to appointment, pt unsure of the last set labs were, was supposed to go for that but cancelled the appointment. Pt has 3+ pitting edema. Pt in nad, skin warm and dry, resp e/u.

## 2012-12-11 NOTE — ED Notes (Signed)
Per EMS - pt coming from home. Has had increased mobility issues along with some confused, started about a week ago. Pt has a fistula in right arm, never had hemodialysis. Back in April was supposed to start hemo but never went in for follow up appointments d/t decreased mobility. Pt sts he hasn't been able to urinate much recently, along with strong odor of the urine. Pt has been incontent. Pt has bilateral upper and lower extremity edema. BP 170/100, HR 64 regular NSR. 12 lead unremarkable. 98% on room air, lung sounds diminished. Pt goes to [facility], Dr. Lorrene Reid is his kidney doctor. NKA.

## 2012-12-11 NOTE — ED Provider Notes (Signed)
CSN: [id number]     Arrival date & time 12/11/12  1254 History     First MD Initiated Contact with Patient 12/11/12 1318     No chief complaint on file.  (Consider location/radiation/quality/duration/timing/severity/associated sxs/prior Treatment) HPI  62 year old male with history of renal disorder, attention, chronic back pain, suspected CHF was brought here via EMS for evaluations of extremity swelling. History obtained mostly through wife and sister who is at bedside. Minimal history was obtained through patient as he minimized everything.  According to wife patient has had increased fluid retention to both of his hands and feet ongoing for the past 2-3 months. Patient seems to be more confused, and decreased ability to move around and taking care of himself like he used to. He is eating less than normal, and having generalized weakness according to wife. She is having a difficult time taking care of him at home. Patient has not been urinating much recently and wife does notice strong odor from his urine. Patient has also been incontinent too weak to clean himself.  Patient has history of kidney disease, unsure what the severity. He was scheduled for dialysis last year but decided to not go through with it. He did have a fistula placed to his right arm in 2011 in preparation for dialysis. Patient has not been taking his regular medication.  Past Medical History  Diagnosis Date  . Renal disorder   . Hypertension   . CHF (congestive heart failure)   . Arthritis   . Chronic back pain    History reviewed. No pertinent past surgical history. No family history on file. History  Substance Use Topics  . Smoking status: Never Smoker   . Smokeless tobacco: Not on file  . Alcohol Use: No    Review of Systems  Unable to perform ROS: Other  pt answer most questions with "no"  Allergies  Review of patient's allergies indicates no known allergies.  Home Medications  No current outpatient  prescriptions on file. BP 145/93  Pulse 65  Resp 16  SpO2 98% Physical Exam  Nursing note and vitals reviewed. Constitutional: He appears well-developed and well-nourished. No distress.  Patient with a disheveled look.  HENT:  Head: Normocephalic and atraumatic.  Oral mucosa dry  Eyes: Conjunctivae are normal. Pupils are equal, round, and reactive to light.  Neck: Normal range of motion. Neck supple. JVD present.  Cardiovascular: Normal rate and regular rhythm.   Pulmonary/Chest: Effort normal and breath sounds normal. No respiratory distress. He has no wheezes. He has no rales.  Diminished lung sound no obvious rales  Abdominal: Soft. There is no tenderness.  Musculoskeletal: He exhibits edema (Patient has 3-4+ pitting edema to all 4 extremities. Palpable radial pulses, and palpable dorsalis pulses.  No rash, no evidence of infection.).  Neurological: He is alert.  Skin: Skin is warm. No rash noted.  Strong palpable thrill noted to right arm fistula  Psychiatric: He has a normal mood and affect.    ED Course   Procedures (including critical care time)  2:08 PM  Date: 12/11/2012  Rate: 65  Rhythm: normal sinus rhythm  QRS Axis: left  Intervals: PR prolonged  ST/T Wave abnormalities: nonspecific T wave changes  Conduction Disutrbances:none  Narrative Interpretation:   Old EKG Reviewed: none available    Patient with significant pitting edema to all 4 extremities. Lung sound is diminished but no obvious rales.  sxs concerning for renal failure or CHF exacerbation.  Pt minimized everything but denies  SI/HI.   4:07 PM UA with evidence of UTI.  ROcephin/zithromax given.   CXR shows mild pleural effusion, cannot r/o PNA.  Pt however denies fever, or productive cough.  Renal function with BUN 108, Cr 9.71 no prior value for comparison. Protein >300 on UA with moderate Hgb on urine dipstick, likely reflecting nephrotic syndrome. Likely chronic kidney disease as pt has had AV  fistula placed in 2011 in preparation for dialysis, but likely lost to f/u.  CO2 14, however pt sats at 100% on RA, no resp distress.  ProBNP >70,000 however in the setting of renal failure, this value is nondiagnostic.    4:11 PM I have consulted nephrologist, Dr. Joelyn Oms who recommend pt to be admitted for further care.  Pt refused to be admitted and prefers to be discharge, despite his family's request for admission.  His wife felt she cannot safely take care of his basic needs at home.  Unfortunately pt is alert/oriented/able to make informed decision at this time.  I discuss risk of leaving AMA including worsening of sxs, and even death, and pt acknowledge this risk.  I discussed this with my attending.    Labs Reviewed  CBC - Abnormal; Notable for the following:    RBC 3.99 (*)    Hemoglobin 11.9 (*)    HCT 35.7 (*)    RDW 17.0 (*)    Platelets 121 (*)    All other components within normal limits  COMPREHENSIVE METABOLIC PANEL - Abnormal; Notable for the following:    CO2 14 (*)    BUN 108 (*)    Creatinine, Ser 9.71 (*)    Calcium 7.9 (*)    Total Protein 5.2 (*)    Albumin 2.8 (*)    GFR calc non Af Amer 5 (*)    GFR calc Af Amer 6 (*)    All other components within normal limits  URINALYSIS, ROUTINE W REFLEX MICROSCOPIC - Abnormal; Notable for the following:    APPearance TURBID (*)    Hgb urine dipstick MODERATE (*)    Protein, ur >300 (*)    Leukocytes, UA LARGE (*)    All other components within normal limits  PRO B NATRIURETIC PEPTIDE - Abnormal; Notable for the following:    Pro B Natriuretic peptide (BNP) >70000.0 (*)    All other components within normal limits  URINE MICROSCOPIC-ADD ON - Abnormal; Notable for the following:    Bacteria, UA MANY (*)    Crystals TRIPLE PHOSPHATE CRYSTALS (*)    All other components within normal limits  URINE CULTURE   Dg Chest 2 View  12/11/2012   *RADIOLOGY REPORT*  Clinical Data: 62 year old male with shortness of breath  CHEST  - 2 VIEW  Comparison: 07/26/2009  Findings: Cardiomegaly noted. Bibasilar atelectasis/airspace disease identified. Small bilateral pleural effusions are noted. There is no evidence of pneumothorax or pulmonary edema. Surgical clips in the lower neck noted.  IMPRESSION: Cardiomegaly with small bilateral pleural effusions and bilateral lower lung atelectasis/airspace disease.  Pneumonia is not excluded.   Original Report Authenticated By: Margarette Canada, M.D.   1. Chronic kidney disease, unspecified stage   2. Renal failure   3. UTI (lower urinary tract infection)   4. Edema, peripheral   5. Pleural effusion     MDM  BP 134/82  Pulse 59  Temp(Src) 97.3 F (36.3 C) (Oral)  Resp 0  SpO2 100%  I have reviewed nursing notes and vital signs. I personally reviewed the imaging tests  through PACS system  I reviewed available ER/hospitalization records thought the EMR     Domenic Moras, [place] 12/11/12 1630

## 2012-12-11 NOTE — H&P (Signed)
[facility] Admission History and Physical Service Pager: [phone]  Patient name: Gregory [name] Medical record number: [id number] Date of birth: 1950/11/12 Age: 62 y.o. Gender: male  Primary Care Provider: None Consultants: Nephrology  Code Status: Full code (need to discuss with patient, was not addressed)   Chief Complaint: swelling in legs and arms.   Assessment and Plan: Gregory [name] is a 61 y.o. year old male presenting with no acute complaints, but poorly compliant as outpatient with  CKD Stage V. Diffuse anasarca. Has been followed by Dr. Lorrene Reid in the past and Nephrology will be consulted. A urinary tract infection was noted by urinalysis so began treated with rocephin. PMH is significant for  HTN, CKD Stage V, CHF, lower back pain.   # Urinary Tract Infection: moderate leukocytes on urinalysis, many bacteria on micro - started on Rocephin 1g daily [ ]  morning labs: CBC, BMP  [ ]  f/u urine culture  # CKD stage V: secondary to HTN, not on dialysis but has fistula in place, marked anasarca - no previous lab work in our system for baseline but most likely at baseline levels of BUN/Cr  - Continue Prednisone 5 mg - unclear why he is on this but continue for now - metabolic anion gap acidosis, likely secondary to uremia - Will give one dose of Lasix 80 mg & monitor output [ ]  f/u urine output, if tolerating will likely do lasix 80 mg TID   [ ]  strict I/O  [ ]  f/u nephrology recs  # ? Pneumonia: CXR not suggestive of PNA, patient asymtomatic without fever, SOB, or cough - was given one dose of Zithromax 500 mg, hold further azithro for now, continue rocephin for UTI  #HTN:  - hold home meds  [ ]  check echo given diffuse anasarca, evaluate for CHF  # Back pain:  - continue home prn oxycodone  FEN/GI: renal diet  Prophylaxis: SubQ heparin   Disposition: pending nephrology recommendations  History of Present Illness: Gregory [name] is a 62  y.o. year old male presenting with swelling in his arms and legs. This is not an acute problem. He has had swelling in his legs for the past 4-5 months. His wife told him to come to the hospital for his lack of care for himself. He has an arm fistula placed in his right arm from 2011 by Dr. Donnetta Hutching. He has not started any dialysis. He has seen Dr. Lorrene Reid in the past for his kidney issues. Last saw Dr. Lorrene Reid 3 months ago, has f/u appointment scheduled on Monday. He has no problems urinating and urinates 3-4 times a day. He has no blood or increase frequency on urination. He sleeps with two pillows behind his head at night. He denies any shortness of breath or cough. He denies any fever or chills or diarrhea. He has cold hands chronically. His other complain was his back pain but this is a chronic condition that has not changed recently.   Review Of Systems: Per HPI. Otherwise 12 point review of systems was performed and was unremarkable.  There are no active problems to display for this patient.  Past Medical History: Past Medical History  Diagnosis Date  . Renal disorder   . Hypertension   . CHF (congestive heart failure)   . Arthritis   . Chronic back pain    Past Surgical History: History reviewed. No pertinent past surgical history. Social History: History  Substance Use Topics  . Smoking  status: Never Smoker   . Smokeless tobacco: Not on file  . Alcohol Use: No   Additional social history: Doesn't drink or use illicit drugs  Please also refer to relevant sections of EMR.  Family History: No family history on file.  Allergies and Medications: Atenolol 150 mg daily  Calcitriol 0.25 mg  Oxycodone-acetomenophen 7.5-325 mg two times daily for pain  Prednisone 5 mg daily    Objective: BP 134/88  Pulse 60  Temp(Src) 97.3 F (36.3 C) (Oral)  Resp 16  SpO2 96% Exam: General: NAD, flat affect, foul odor associated with patient  HEENT: [place]/AT, tympanic membranes intact  bilaterally, PERRL, EOMI, oropharynx clear, MMM Cardiovascular: S1S2, RRR, no mrg  Respiratory: CTAB, no wheezes/crackles  Abdomen: +BS, soft NTND Extremities: significant swelling in all four extremities, +4 pitting edema in upper and lower extremities, fistula seen in right arm with good thrill, normal strength in legs, +2 pulses in upper and lower extremities  Skin: fistula seen in right arm, dry skin  Neuro: Alert, speech intact, grossly oriented  Labs and Imaging: CBC BMET   Recent Labs Lab 12/11/12 1318  WBC 4.4  HGB 11.9*  HCT 35.7*  PLT 121*    Recent Labs Lab 12/11/12 1318  NA 141  K 4.9  CL 106  CO2 14*  BUN 108*  CREATININE 9.71*  GLUCOSE 87  CALCIUM 7.9*     Urinalysis    Component Value Date/Time   COLORURINE YELLOW 12/11/2012 1338   APPEARANCEUR TURBID* 12/11/2012 1338   LABSPEC 1.014 12/11/2012 1338   PHURINE 8.0 12/11/2012 1338   GLUCOSEU NEGATIVE 12/11/2012 1338   HGBUR MODERATE* 12/11/2012 1338   [place] 12/11/2012 [place] 12/11/2012 1338   PROTEINUR >300* 12/11/2012 1338   UROBILINOGEN 0.2 12/11/2012 1338   NITRITE NEGATIVE 12/11/2012 1338   LEUKOCYTESUR LARGE* 12/11/2012 1338   8/8: Urine cx: pending  8/8: Pro-BNP: >70,000 8/8: CXR: Comparison: 07/26/2009 IMPRESSION:  Cardiomegaly with small bilateral pleural effusions and bilateral lower lung atelectasis/airspace disease. Pneumonia is not excluded.  Clearance Coots, MD 12/11/2012, 8:30 PM PGY-1, [place] Intern pager: [phone], text pages welcome   Upper Level Addendum:  I have seen and evaluated this patient along with Dr. Raeford Razor and reviewed the above note, making necessary changes in pink.  Chrisandra Netters, MD Family Medicine PGY-2    Attending Addendum  I examined the patient and discussed the assessment and plan with Drs. Raeford Razor and [place]. I have reviewed the note and agree.  Briefly, 62 yo M with CKD stage 5 presents with longstanding  anasarca. No dyspnea. Admitted for an attempt at IV diuresis before dialysis. Patient has patent RUE AV fistula. Admitted with treatment for UTI, but after additional questioning he denies dysuria, frequency or urgency and is most likely colonized rather than infected and dose not require treatment. Will discontinue rocephin.   Boykin Nearing, [place]

## 2012-12-12 DIAGNOSIS — J90 Pleural effusion, not elsewhere classified: Secondary | ICD-10-CM

## 2012-12-12 LAB — BASIC METABOLIC PANEL
BUN: 110 mg/dL — ABNORMAL HIGH (ref 6–23)
Creatinine, Ser: 9.3 mg/dL — ABNORMAL HIGH (ref 0.50–1.35)
GFR calc Af Amer: 6 mL/min — ABNORMAL LOW (ref >90)
GFR calc non Af Amer: 5 mL/min — ABNORMAL LOW (ref >90)
Potassium: 4 mEq/L (ref 3.5–5.1)

## 2012-12-12 LAB — RENAL FUNCTION PANEL
Albumin: 2.8 g/dL — ABNORMAL LOW (ref 3.5–5.2)
Calcium: 8 mg/dL — ABNORMAL LOW (ref 8.4–10.5)
Chloride: 108 mEq/L (ref 96–112)
Creatinine, Ser: 9.88 mg/dL — ABNORMAL HIGH (ref 0.50–1.35)
GFR calc non Af Amer: 5 mL/min — ABNORMAL LOW (ref >90)
Sodium: 142 mEq/L (ref 135–145)

## 2012-12-12 LAB — CBC
MCV: 87.9 fL (ref 78.0–100.0)
Platelets: 109 10*3/uL — ABNORMAL LOW (ref 150–400)
RDW: 17.1 % — ABNORMAL HIGH (ref 11.5–15.5)
WBC: 3.1 10*3/uL — ABNORMAL LOW (ref 4.0–10.5)

## 2012-12-12 LAB — PROTEIN / CREATININE RATIO, URINE
Creatinine, Urine: 94.93 mg/dL
Total Protein, Urine: 316.8 mg/dL

## 2012-12-12 MED ORDER — KIDNEY FAILURE BOOK
Freq: Once | Status: AC
  Administered 2012-12-12: 19:00:00
  Filled 2012-12-12: qty 1

## 2012-12-12 MED ORDER — CALCIUM ACETATE 667 MG PO CAPS
1334.0000 mg | ORAL_CAPSULE | Freq: Three times a day (TID) | ORAL | Status: DC
  Administered 2012-12-12 – 2012-12-20 (×21): 1334 mg via ORAL
  Filled 2012-12-12 (×28): qty 2

## 2012-12-12 MED ORDER — FUROSEMIDE 10 MG/ML IJ SOLN
80.0000 mg | Freq: Three times a day (TID) | INTRAMUSCULAR | Status: DC
  Administered 2012-12-12 – 2012-12-14 (×5): 80 mg via INTRAVENOUS
  Filled 2012-12-12 (×9): qty 8

## 2012-12-12 MED ORDER — SODIUM BICARBONATE 650 MG PO TABS
1300.0000 mg | ORAL_TABLET | Freq: Two times a day (BID) | ORAL | Status: DC
  Administered 2012-12-12 – 2012-12-13 (×4): 1300 mg via ORAL
  Filled 2012-12-12 (×6): qty 2

## 2012-12-12 MED ORDER — TUBERCULIN PPD 5 UNIT/0.1ML ID SOLN
5.0000 [IU] | Freq: Once | INTRADERMAL | Status: AC
  Administered 2012-12-12: 5 [IU] via INTRADERMAL
  Filled 2012-12-12: qty 0.1

## 2012-12-12 NOTE — H&P (Signed)
Seen and examined.  Discussed with team.  Agree with their admit, documentation and management.  Briefly 63 yo male with known CKD approaching ESRD presents with anasarca.  Otherwise feels OK.  We have consulted renal.  Likely he has come to the dialysis requiring stage of his ESRD.

## 2012-12-12 NOTE — Consult Note (Signed)
Gregory [name] 12/12/2012  Requesting Physician:  [place]  Reason for Consult:  Volume Overload, progression of CKD HPI:  79M followed by Dr. Jamal Maes for late stage CKD.  Office records not available this am.  Per pt report he follows with her every 1-3 months and had a R UE AVF placed in 2011.    He presented to the ED yesterday because of concern from his wife about worsened swelling.  He has longstanding R UE edema assoc with ipsilateral AVF. He has LEE extending proximally to lower back.  Per admission records he was not taking diuretics.  Pt denies SOB, CP, N/V, dysgeusia, hiccups, rashes, sleep disturbances, or confusion.  He feels well otherwise.  He has been reticent to start HD at the reported recommendation of Dr. Lorrene Reid but today feels resigned to this if it is recommended.    Creatinine, Ser (mg/dL)  Date Value  12/12/2012 9.88*  12/11/2012 9.71*  ] ROS Balance of 12 systems is negative w/ exceptions as above  PMH  Past Medical History  Diagnosis Date  . Renal disorder   . Hypertension   . CHF (congestive heart failure)   . Arthritis   . Chronic back pain    PSH s/p AVF creation 2011.  ? Parathyroidectomy in chart FH No family history on file. SH  reports that he has never smoked. He does not have any smokeless tobacco history on file. He reports that he does not drink alcohol or use illicit drugs. Allergies No Known Allergies Home medications Prior to Admission medications   Medication Sig Start Date End Date Taking? Authorizing Provider  atenolol (TENORMIN) 100 MG tablet Take 150 mg by mouth daily.   Yes Historical Provider, MD  calcitRIOL (ROCALTROL) 0.25 MCG capsule Take 1 mcg by mouth 2 (two) times daily.   Yes Historical Provider, MD  oxyCODONE-acetaminophen (PERCOCET) 7.5-325 MG per tablet Take 1 tablet by mouth 2 (two) times daily as needed for pain.   Yes Historical Provider, MD  predniSONE (DELTASONE) 5 MG tablet Take 5 mg by mouth daily.    Yes Historical Provider, MD  cephALEXin (KEFLEX) 500 MG capsule Take 1 capsule (500 mg total) by mouth 4 (four) times daily. 12/11/12   Domenic Moras, PA-C    Current Medications Current Facility-Administered Medications  Medication Dose Route Frequency Provider Last Rate Last Dose  . 0.9 %  sodium chloride infusion  250 mL Intravenous PRN Leeanne Rio, MD      . atenolol (TENORMIN) tablet 150 mg  150 mg Oral Daily Leeanne Rio, MD   150 mg at 12/12/12 1013  . calcitRIOL (ROCALTROL) capsule 1 mcg  1 mcg Oral BID Leeanne Rio, MD   1 mcg at 12/12/12 1013  . calcium acetate (PHOSLO) capsule 1,334 mg  1,334 mg Oral TID WC Rexene Agent, MD      . cefTRIAXone (ROCEPHIN) 1 g in dextrose 5 % 50 mL IVPB  1 g Intravenous Q24H Leeanne Rio, MD      . furosemide (LASIX) injection 80 mg  80 mg Intravenous Q8H Leeanne Rio, MD      . heparin injection 5,000 Units  5,000 Units Subcutaneous Q8H Leeanne Rio, MD   5,000 Units at 12/12/12 [phone]  . oxyCODONE-acetaminophen (PERCOCET/ROXICET) 5-325 MG per tablet 1 tablet  1 tablet Oral Q12H PRN Benjamine Sprague Markle, RPH      . polyethylene glycol (MIRALAX / GLYCOLAX) packet 17 g  17  g Oral Daily PRN Leeanne Rio, MD      . predniSONE (DELTASONE) tablet 5 mg  5 mg Oral Daily Leeanne Rio, MD   5 mg at 12/12/12 1013  . sodium bicarbonate tablet 1,300 mg  1,300 mg Oral BID Rexene Agent, MD      . sodium chloride 0.9 % injection 3 mL  3 mL Intravenous Q12H Leeanne Rio, MD   3 mL at 12/12/12 1013  . sodium chloride 0.9 % injection 3 mL  3 mL Intravenous PRN Leeanne Rio, MD      . tuberculin injection 5 Units  5 Units Intradermal Once Rexene Agent, MD        CBC  Recent Labs Lab 12/11/12 1318 12/12/12 0757  WBC 4.4 3.1*  HGB 11.9* 12.5*  HCT 35.7* 36.9*  MCV 89.5 87.9  PLT 121* 0000000*   Basic Metabolic Panel  Recent Labs Lab 12/11/12 1318 12/12/12 0757  NA 141 142  K 4.9 5.3*  CL  106 108  CO2 14* 12*  GLUCOSE 87 84  BUN 108* 112*  CREATININE 9.71* 9.88*  CALCIUM 7.9* 8.0*  PHOS  --  8.8*    Physical Exam  Blood pressure 119/81, pulse 56, temperature 97.1 F (36.2 C), temperature source Oral, resp. rate 18, height 6\' 3"  (1.905 m), weight 114.533 kg (252 lb 8 oz), SpO2 100.00%. GEN: NAD, stoic,  Poor historian ENT: NCAT EYES: EOMI, glasses CV: RRR, no rub PULM: bibasilar crackles, nl wob, no wheezing ABD: s/nt/nd SKIN: no rashes EXT:3-4+ pitting edema up to lower back.  R UE AVF with good thrill and bruit.  Is tortuous but no focal aneurysmal changes.      A/P 38M   1. New start ESRD: have d/w pt who is reluctantly amenable to initate HD.  First treatment tomorrow. Hepatitis serolgies ordered.  PPD ordered.  WIll pursue placement/HD chair starting tomorrow in AM.   Will obtain office records in AM to verify info.  Plan to use AVF.   2. Anasarca: suspect driven by renal disease and excessive Na intake.  Agree with TTE and will check UP/C.  Cont furosemide, Na restriction.  Will also address with UF at dialysis. 3. Hyperkalemia: very mild, would not repeat kayexalate unless K > 6.0, and then likey would be treated more efficiently with dialysis.   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  Start PhosLo.  Cont calcitriol.  Check PTH this evening.   5. Inc AG metabolic acidosis: almost certainly 2/2 renal failure: start NaHCO3 1300 BID today, will improve with HD as well 6. Anemia: mild, no indication for ESA at this time.    Pearson Grippe MD 12/12/2012, 10:58 AM

## 2012-12-12 NOTE — Progress Notes (Signed)
Addendum: 12/11/12 1900 nsg Skin issues noted on patients buttocks noted.

## 2012-12-12 NOTE — Progress Notes (Signed)
Echocardiogram 2D Echocardiogram has been performed.  Gregory [name] 12/12/2012, 12:22 PM

## 2012-12-12 NOTE — Progress Notes (Signed)
Family Medicine Teaching Service Daily Progress Note Intern Pager: [phone]  Patient name: Gregory [name] Medical record number: [id number] Date of birth: [birth year] Age: 62 y.o. Gender: male  Primary Care Provider: No primary provider on file. Consultants: nephrology Code Status: full code (need to discuss with patient, was not addressed upon admission)  Pt Overview and Major Events to Date:  8/9 admitted with diffuse anasarca, no PCP  Assessment and Plan:  Gregory [name] is a 62 y.o. year old male presenting with no acute complaints, but poorly compliant as outpatient with CKD stage V. Diffuse anasarca. Also with UTI.   # CKD stage V: secondary to HTN, not on dialysis but has fistula in place, marked anasarca  - no previous lab work in our system for baseline but most likely at baseline levels of BUN/Cr  - Continue Prednisone 5 mg - unclear why he is on this but continue for now  - metabolic anion gap acidosis, likely secondary to uremia   - Not much UOP charted but reportedly from RN patient did urinate a lot overnight, so will continue 80mg  lasix TID [ ]  f/u urine output - has condom cath on [ ]  strict I/O, daily weights [ ]  f/u nephrology recs   # Hyperkalemia: K is 5.3 today - lasix will help with this, will recheck in PM - may benefit from initiation of dialysis, await renal recommendations  # ? Pneumonia: CXR not suggestive of PNA, patient asymtomatic without fever, SOB, or cough  - was given one dose of Zithromax 500 mg, hold further azithro for now, continue rocephin for UTI   # HTN:  - continue home atenolol [ ]  check echo given diffuse anasarca, evaluate for CHF   # Report of skin breakdown on buttock: - consult [facility] nursing team, appreciate recommendations  # Back pain:  - continue home prn percocet  FEN/GI: renal diet  Prophylaxis: SubQ heparin   Disposition: pending nephrology recommendations  Subjective: Feels well this morning, is without  complaints.  Objective: Temp:  [97.1 F (36.2 C)-97.4 F (36.3 C)] 97.1 F (36.2 C) (08/10 0434) Pulse Rate:  [56-69] 56 (08/10 0434) Resp:  [0-22] 18 (08/10 0434) BP: (119-154)/(80-95) 119/81 mmHg (08/10 0434) SpO2:  [96 %-100 %] 100 % (08/10 0434) Weight:  [252 lb 8 oz (114.533 kg)] 252 lb 8 oz (114.533 kg) (08/09 2053) Physical Exam: General: NAD, sitting up in bed Cardiovascular: RRR Respiratory: CTAB, NWOB Abdomen: soft, nontender to palpation, +BS Extremities: still with 3-4+ edema of all extremities, calves nontender to palpation  Laboratory:  Recent Labs Lab 12/11/12 1318 12/12/12 0757  WBC 4.4 3.1*  HGB 11.9* 12.5*  HCT 35.7* 36.9*  PLT 121* PENDING    Recent Labs Lab 12/11/12 1318 12/12/12 0757  NA 141 142  K 4.9 5.3*  CL 106 108  CO2 14* 12*  BUN 108* 112*  CREATININE 9.71* 9.88*  CALCIUM 7.9* 8.0*  PROT 5.2*  --   BILITOT 0.6  --   ALKPHOS 102  --   ALT 17  --   AST 19  --   GLUCOSE 87 84    Imaging/Diagnostic Tests: No new studies today  Leeanne Rio, MD 12/12/2012, 9:00 AM PGY-2, [place] Intern pager: [phone], text pages welcome  Attending Addendum  I examined the patient and discussed the assessment and plan with Dr. Ardelia Mems. I have reviewed the note, removed UTI from problem list since patient dose not meet criteria for UTI (assymptomatic)  and agree.    Boykin Nearing, [place]

## 2012-12-13 LAB — RENAL FUNCTION PANEL
Albumin: 2.5 g/dL — ABNORMAL LOW (ref 3.5–5.2)
BUN: 108 mg/dL — ABNORMAL HIGH (ref 6–23)
CO2: 15 mEq/L — ABNORMAL LOW (ref 19–32)
Chloride: 107 mEq/L (ref 96–112)
Creatinine, Ser: 9.59 mg/dL — ABNORMAL HIGH (ref 0.50–1.35)
Glucose, Bld: 92 mg/dL (ref 70–99)

## 2012-12-13 LAB — CBC
HCT: 31.5 % — ABNORMAL LOW (ref 39.0–52.0)
Hemoglobin: 10.7 g/dL — ABNORMAL LOW (ref 13.0–17.0)
MCH: 29.3 pg (ref 26.0–34.0)
MCV: 86.3 fL (ref 78.0–100.0)
RBC: 3.65 MIL/uL — ABNORMAL LOW (ref 4.22–5.81)
WBC: 3.6 10*3/uL — ABNORMAL LOW (ref 4.0–10.5)

## 2012-12-13 LAB — HEPATITIS B CORE ANTIBODY, TOTAL: Hep B Core Total Ab: POSITIVE — AB

## 2012-12-13 MED ORDER — OXYCODONE-ACETAMINOPHEN 5-325 MG PO TABS
1.0000 | ORAL_TABLET | ORAL | Status: DC | PRN
  Administered 2012-12-13 – 2012-12-20 (×7): 1 via ORAL
  Filled 2012-12-13 (×7): qty 1

## 2012-12-13 MED ORDER — LABETALOL HCL 5 MG/ML IV SOLN
10.0000 mg | INTRAVENOUS | Status: DC | PRN
  Filled 2012-12-13: qty 4

## 2012-12-13 NOTE — Progress Notes (Signed)
Pt and family viewed Videos 406 and 407.

## 2012-12-13 NOTE — Progress Notes (Signed)
PGY-2 Interim Note  Paged by RN x2 between [phone] and [phone]. First for increased pain in pt's back; pt ordered for Percocet at home dose, which he reportedly takes "twice per day," so order is for q12. Modified order for q4, for acute pain. Paged second time for increased BP, 170's/120's (machine reading). PRN order placed for labetalol PRN for systolic [id number] or diastolic [id number], with hold for pulse <55 (pt is on atenolol at baseline). Plan to recheck BP in about 1 hour; if persistently elevated, may need to consider other agents. Otherwise will reassess as needed.  Emmaline Kluver, MD PGY-2, [place] [facility] 12/13/2012, 9:14 PM [place] Service pager: [phone] (text pages welcome through [facility])

## 2012-12-13 NOTE — Progress Notes (Signed)
Family Medicine Teaching Service Daily Progress Note Intern Pager: [phone]  Patient name: Gregory [name] Medical record number: [id number] Date of birth: [birth year] Age: 62 y.o. Gender: male  Primary Care Provider: No primary provider on file. Consultants: nephrology Code Status: full code (need to discuss with patient, was not addressed upon admission)  Pt Overview and Major Events to Date:  8/9 admitted with diffuse anasarca, no PCP  Assessment and Plan:  Gregory [name] is a 62 y.o. year old male presenting with no acute complaints, but poorly compliant as outpatient with CKD stage V. Diffuse anasarca.   # CKD stage V: secondary to HTN, GFR: 6, not on dialysis but has fistula in place, marked anasarca  - no previous lab work in our system for baseline but most likely at baseline levels of BUN/Cr. Current BUN/Cr: 110/9.3 - Continue Prednisone 5 mg - unclear why he is on this but continue for now  - metabolic anion gap acidosis, likely secondary to uremia, gap decreased to 18 - continue 80mg  lasix TID [ ]  f/u urine output - has condom cath on [ ]  strict I/O, daily weights [ ]  f/u nephrology recs   # Hyperkalemia: K is 4 today down from 5.3 - lasix will help with this, will recheck in PM  # ? Pneumonia: CXR not suggestive of PNA, patient asymtomatic without fever, SOB, or cough  - was given one dose of Zithromax 500 mg, hold further azithro for now   # HTN: BPs have been stable in 130s/70-80s (one episode 160/96) - continue home atenolol - Echo (8/10) - Biventricular dysfunction (right worse than left); EF: 30-35%  # Report of skin breakdown on buttock: - stage 3 pressure ulcer with stage 2 epithelial buds - air mattress and chair pressure redistribution pad  # Back pain:  - continue home prn percocet  FEN/GI: renal diet  Prophylaxis: SubQ heparin  Disposition: pending nephrology recommendations  Subjective: Feels well this morning, continues without complaints. No  chest pain or shortness of breath.  Objective: Temp:  [97.1 F (36.2 C)-98.4 F (36.9 C)] 97.5 F (36.4 C) (08/11 0444) Pulse Rate:  [55-68] 58 (08/11 0444) Resp:  [16-20] 16 (08/11 0444) BP: (122-160)/(67-96) 160/96 mmHg (08/11 0444) SpO2:  [98 %-100 %] 100 % (08/11 0444) Weight:  [252 lb 4.8 oz (114.443 kg)] 252 lb 4.8 oz (114.443 kg) (08/10 2232)  Physical Exam: General: NAD, lying in bed, wife and sister in the room with him Cardiovascular: RRR Respiratory: CTAB, NWOB Abdomen: soft, nontender to palpation, +BS Extremities: 3-4+ edema of all extremities, calves nontender to palpation, fistula in right arm with palpable thrill  Laboratory:  Recent Labs Lab 12/11/12 1318 12/12/12 0757  WBC 4.4 3.1*  HGB 11.9* 12.5*  HCT 35.7* 36.9*  PLT 121* 109*    Recent Labs Lab 12/11/12 1318 12/12/12 0757 12/12/12 1826  NA 141 142 141  K 4.9 5.3* 4.0  CL 106 108 106  CO2 14* 12* 17*  BUN 108* 112* 110*  CREATININE 9.71* 9.88* 9.30*  CALCIUM 7.9* 8.0* 7.7*  PROT 5.2*  --   --   BILITOT 0.6  --   --   ALKPHOS 102  --   --   ALT 17  --   --   AST 19  --   --   GLUCOSE 87 84 77    Imaging/Diagnostic Tests: No new studies today  Cordelia Poche, MD 12/13/2012, 6:08 AM PGY-1, [place] Intern pager: [phone],  text pages welcome

## 2012-12-13 NOTE — Progress Notes (Signed)
Utilization review completed.  

## 2012-12-13 NOTE — Consult Note (Signed)
WOC consult Note: discussed with bedside nurse and RN who performs unit PUP rounds  Reason for Consult: evaluation of buttock wounds. Staff report this pt to be ambulatory, however with the development of these pressure ulcers would question his true "ambulatory' status. Wound type: 3 POA Pressure ulcers Stage III left buttock, Stage III and Stage II right buttock Pressure Ulcer POA: Yes/No Measurement:see doc. Flow sheets per nursing Wound bed: Stage III areas are full thickness per further discussion with the nursing staff at the deepest point. Stage II with epithelial buds, partial thickness. They are all clean and with moderate drainage, however he has severe sacral and buttock edema. Drainage (amount, consistency, odor) no odor, non purulent Periwound:intact Dressing procedure/placement/frequency: currently using silicone foam dressings for protection and insulation to promote re-epithelialization. However nursing staff reports with diuresis and attempts to try to normalize the patients electrolytes he is having many loose stools, and they are having to change the foam frequently (3 x per last shift), therefore more efficient treatment would be the use of zinc based barrier along with the fact this would protect the wounds from incontinence better than the foam if they are getting soiled and absorbing stool into them.  Will write orders to use both based on the status of the patient. Will add air mattress due to the fact patient now on HD and will add chair pressure redistribution pad for when up in the chair.  Discussed POC with  bedside nurse.  Re consult if needed, will not follow at this time. Thanks  Glorimar Stroope [place], [place] [phone])

## 2012-12-13 NOTE — Plan of Care (Signed)
Problem: Food- and Nutrition-Related Knowledge Deficit (NB-1.1) Goal: Nutrition education Formal process to instruct or train a patient/client in a skill or to impart knowledge to help patients/clients voluntarily manage or modify food choices and eating behavior to maintain or improve health. Outcome: Completed/Met Date Met:  12/13/12 Nutrition Education Note  RD consulted for Renal Education. Provided Choose-A-Meal Booklet to patient/family. Reviewed food groups and provided written recommended serving sizes specifically determined for patient's current nutritional status.   Explained why diet restrictions are needed and provided lists of foods to limit/avoid that are high potassium, sodium, and phosphorus. Provided specific recommendations on safer alternatives of these foods. Strongly encouraged compliance of this diet.   Discussed importance of protein intake at each meal and snack. Provided examples of how to maximize protein intake throughout the day. Discussed need for fluid restriction with dialysis, importance of minimizing weight gain between HD treatments, and renal-friendly beverage options.  Encouraged pt to discuss specific diet questions/concerns with RD at HD outpatient facility. Teach back method used.  Expect fair compliance.  Body mass index is 31.39 kg/(m^2). Pt meets criteria for obesity class 1 based on current BMI.  Current diet order is regular, patient is consuming approximately 100% of meals at this time. Labs and medications reviewed. No further nutrition interventions warranted at this time. RD contact information provided. If additional nutrition issues arise, please re-consult RD.  Gregory [name] RD, LDN Pager [phone] After Hours pager [phone]

## 2012-12-13 NOTE — Progress Notes (Signed)
I have seen and examined this patient and agree with the plan of care. Seen on dialysis. New start no issues and first treatment, CLIP process pending  Gregory [name] W 12/13/2012, 8:59 AM

## 2012-12-13 NOTE — Progress Notes (Signed)
I discussed with  Dr Nettey.  I agree with their plans documented in their progress note for today.  

## 2012-12-14 DIAGNOSIS — R601 Generalized edema: Secondary | ICD-10-CM | POA: Diagnosis present

## 2012-12-14 DIAGNOSIS — M549 Dorsalgia, unspecified: Secondary | ICD-10-CM | POA: Diagnosis present

## 2012-12-14 DIAGNOSIS — I10 Essential (primary) hypertension: Secondary | ICD-10-CM | POA: Diagnosis present

## 2012-12-14 LAB — RENAL FUNCTION PANEL
BUN: 96 mg/dL — ABNORMAL HIGH (ref 6–23)
CO2: 21 mEq/L (ref 19–32)
Chloride: 106 mEq/L (ref 96–112)
Creatinine, Ser: 8.66 mg/dL — ABNORMAL HIGH (ref 0.50–1.35)
Glucose, Bld: 102 mg/dL — ABNORMAL HIGH (ref 70–99)
Potassium: 5.1 mEq/L (ref 3.5–5.1)

## 2012-12-14 MED ORDER — DOXERCALCIFEROL 4 MCG/2ML IV SOLN
4.0000 ug | INTRAVENOUS | Status: DC
  Administered 2012-12-15: 4 ug via INTRAVENOUS
  Filled 2012-12-14 (×3): qty 2

## 2012-12-14 NOTE — Progress Notes (Addendum)
12/14/2012 10:55 AM  Offered to start the remaining HD education videos for the patient while family was in the room, pt declined at this time, stating he would prefer to watch them this afternoon.  Will follow. Princella Pellegrini  Addendum:   Pt also offered to get OOB to chair for lunch, which at this time he declined.  Will follow.

## 2012-12-14 NOTE — Clinical Documentation Improvement (Signed)
Based on your clinical judgment, please clarify TYPE & ACUITY and document in a progress note and/or discharge summary the clinical condition associated with the following supporting information:  In responding to this query please exercise your independent judgment.  The fact that a query is asked, does not imply that any particular answer is desired or expected.  Possible Clinical Conditions?  Chronic Systolic Congestive Heart Failure Chronic Diastolic Congestive Heart Failure Chronic Systolic & Diastolic Congestive Heart Failure Acute Systolic Congestive Heart Failure Acute Diastolic Congestive Heart Failure Acute Systolic & Diastolic Congestive Heart Failure Acute on Chronic Systolic Congestive Heart Failure Acute on Chronic Diastolic Congestive Heart Failure [x]  Acute on Chronic Systolic & Diastolic  Congestive Heart Failure Other Condition_____________ Cannot Clinically Determine  Supporting Information:  Risk Factors:(As per notes) "Pt has CHF, ESRD" Signs & Symptoms: (As per notes) "Diffuse anasarca" Diagnostics: ECHO: Study Conclusions    - Left ventricle: The cavity size was mildly &dilated. Wall   thickness was increased in a pattern of moderate LVH.   Systolic function was moderately to severely reduced. The   estimated ejection fraction was in the range of 30% to   35%. Diffuse hypokinesis. Doppler parameters are   consistent with abnormal left ventricular relaxation   (grade 1 diastolic dysfunction). Doppler parameters are   consistent with high ventricular filling pressure. - Ventricular septum: The contour showed diastolic   flattening and systolic flattening. - Mitral valve: Calcified annulus. Mild regurgitation. - Left atrium: The atrium was mildly &dilated. - Right ventricle: The cavity size was severely &dilated.   Systolic function was severely reduced. - Right atrium: The atrium was severely &dilated. - Pulmonary arteries: PA peak pressure: 84mm Hg  (S). - Pericardium, extracardiac: There was a left pleural   effusion. -  Impressions:There is biventricular dysfunction with RV   dyfunction worse than LV. Impressions:- There is biventricular dysfunction with RV dyfunction   worse than LV.  CXR 12/11/12 IMPRESSION: Cardiomegaly with small bilateral pleural effusions and bilateral lower lung atelectasis/airspace disease. Pneumonia is not excluded.   Treatment: furosemide (LASIX) injection 80 mg [id number]    Ordered Dose: 80 mg Route: Intravenous Frequency: 3 times per day   Reviewed: additional documentation in the medical record -see the above

## 2012-12-14 NOTE — Progress Notes (Signed)
12/14/2012 2:06 PM  Offered to resume showing the remaining HD educational videos to patient since visitors left.  Pt again refused at this time.  Will follow. Princella Pellegrini

## 2012-12-14 NOTE — Plan of Care (Signed)
Problem: Phase I Progression Outcomes Goal: Skin without signs of pressure Outcome: Not Progressing Pt admitted with pressure ulcers, still has two stage two ulcers, one to each buttocks

## 2012-12-14 NOTE — Progress Notes (Signed)
I discussed with  Dr Nettey.  I agree with their plans documented in their progress note for today.  

## 2012-12-14 NOTE — Progress Notes (Signed)
Late Entry  Pt's BP 175/120 at 2100. No BP meds ordered. MD on call notified and new orders rec'd for PRN labetalol. Pharmacy notified to send medication. Med did not arrive to unit until 2230; at this time BP 139/85. PRN labetalol not needed. Will monitor.

## 2012-12-14 NOTE — Progress Notes (Signed)
Family Medicine Teaching Service Daily Progress Note Intern Pager: [phone]  Patient name: Gregory [name] Medical record number: [id number] Date of birth: 02/16/1951 Age: 62 y.o. Gender: male  Primary Care Provider: No primary provider on file. Consultants: Nephrology Code Status: full code  Pt Overview and Major Events to Date:  8/9 - admitted with diffuse anasarca, no PCP 8/11 - s/p dialysis  Assessment and Plan: Gregory [name] is a 62 y.o. year old male presenting with no acute complaints, but poorly compliant as outpatient with CKD stage V. Diffuse anasarca.  # ESRD - currently on dialysis  - no previous lab work in our system for baseline but most likely at baseline levels of BUN/Cr. Current BUN/Cr: 96/8.66 (8/12). Weight: 251lbs (252lbs on admission). - Continue Prednisone 5 mg - unclear why he is on this but continue for now  - metabolic anion gap acidosis, likely secondary to uremia, gap decreased to 15 with a normal bicarb of 21 - continue 80mg  lasix TID - s/p hemodialysis - net I/O: -854ml (includes 1L from dialysis) - UOP last 24 hours: 166ml [ ]  f/u urine output [ ]  strict I/O, daily weights [ ]  f/u nephrology recs  # Hyperkalemia: K is 5.1 today (5.3 on admission) [ ]  f/u bmet  # HTN: BPs have been stable in 130s/70-80s (one episode 175/120) - continue home atenolol - Echo (8/10) - Biventricular dysfunction (right worse than left); EF: 30-35%  # Report of skin breakdown on buttock: - stage 3 pressure ulcer with stage 2 epithelial buds - air mattress and chair pressure redistribution pad  # Back pain:  - increased percocet to q4 PRN administration  FEN/GI: renal diet  Prophylaxis: SubQ heparin  Disposition: pending nephrology recommendations  Subjective: Feels well this morning, continues without complaints. No chest pain or shortness of breath.  Objective: Temp:  [96.7 F (35.9 C)-98.1 F (36.7 C)] 98.1 F (36.7 C) (08/12 0449) Pulse Rate:  [55-69]  58 (08/12 0449) Resp:  [14-20] 18 (08/12 0449) BP: (131-175)/(72-120) 147/90 mmHg (08/12 0449) SpO2:  [100 %] 100 % (08/12 0449) Weight:  [251 lb 1.7 oz (113.9 kg)-253 lb 8.5 oz (115 kg)] 251 lb 1.7 oz (113.901 kg) (08/12 0449)  Physical Exam: General: NAD, lying in bed, wife and sister in the room with him Cardiovascular: RRR, no murmurs Respiratory: CTAB, NWOB Abdomen: soft, nontender to palpation, +BS Extremities: 3+ edema in arms bilaterally with decreased skin tightness which is improved. 4+ edema legs bilaterally.  calves nontender to palpation, fistula in right arm with palpable thrill  Laboratory:  Recent Labs Lab 12/11/12 1318 12/12/12 0757 12/13/12 0814  WBC 4.4 3.1* 3.6*  HGB 11.9* 12.5* 10.7*  HCT 35.7* 36.9* 31.5*  PLT 121* 109* 120*    Recent Labs Lab 12/11/12 1318  12/12/12 1826 12/13/12 0813 12/14/12 0527  NA 141  < > 141 141 142  K 4.9  < > 4.0 4.3 5.1  CL 106  < > 106 107 106  CO2 14*  < > 17* 15* 21  BUN 108*  < > 110* 108* 96*  CREATININE 9.71*  < > 9.30* 9.59* 8.66*  CALCIUM 7.9*  < > 7.7* 7.7* 8.3*  PROT 5.2*  --   --   --   --   BILITOT 0.6  --   --   --   --   ALKPHOS 102  --   --   --   --   ALT 17  --   --   --   --  AST 19  --   --   --   --   GLUCOSE 87  < > 77 92 102*  < > = values in this interval not displayed.  Imaging/Diagnostic Tests: No new studies today  Cordelia Poche, MD 12/14/2012, 7:04 AM PGY-1, [place] Intern pager: [phone], text pages welcome

## 2012-12-14 NOTE — Progress Notes (Signed)
[facility] ROUNDING NOTE   Subjective:   Interval History: awake alert no complaints  Objective:  Vital signs in last 24 hours:  Temp:  [97.1 F (36.2 C)-98.1 F (36.7 C)] 98.1 F (36.7 C) (08/12 0959) Pulse Rate:  [58-69] 58 (08/12 0959) Resp:  [17-19] 18 (08/12 0959) BP: (131-175)/(76-120) 131/86 mmHg (08/12 0959) SpO2:  [100 %] 100 % (08/12 0959) Weight:  [113.901 kg (251 lb 1.7 oz)] 113.901 kg (251 lb 1.7 oz) (08/12 0449)  Weight change: 0.557 kg (1 lb 3.7 oz) Filed Weights   12/13/12 0800 12/13/12 0957 12/14/12 0449  Weight: 115 kg (253 lb 8.5 oz) 113.9 kg (251 lb 1.7 oz) 113.901 kg (251 lb 1.7 oz)    Intake/Output: I/O last 3 completed shifts: In: 240 [P.O.:240] Out: 1130 [Urine:130; Other:1000]   Intake/Output this shift:     CVS- RRR RS- CTA ABD- BS present soft non-distended EXT- 2+ edema some wrinkling of skin   Basic Metabolic Panel:  Recent Labs Lab 12/11/12 1318 12/12/12 0757 12/12/12 1826 12/13/12 0813 12/14/12 0527  NA 141 142 141 141 142  K 4.9 5.3* 4.0 4.3 5.1  CL 106 108 106 107 106  CO2 14* 12* 17* 15* 21  GLUCOSE 87 84 77 92 102*  BUN 108* 112* 110* 108* 96*  CREATININE 9.71* 9.88* 9.30* 9.59* 8.66*  CALCIUM 7.9* 8.0* 7.7* 7.7* 8.3*  PHOS  --  8.8*  --  8.3* 7.8*    Liver Function Tests:  Recent Labs Lab 12/11/12 1318 12/12/12 0757 12/13/12 0813 12/14/12 0527  AST 19  --   --   --   ALT 17  --   --   --   ALKPHOS 102  --   --   --   BILITOT 0.6  --   --   --   PROT 5.2*  --   --   --   ALBUMIN 2.8* 2.8* 2.5* 2.6*   No results found for this basename: LIPASE, AMYLASE,  in the last 168 hours No results found for this basename: AMMONIA,  in the last 168 hours  CBC:  Recent Labs Lab 12/11/12 1318 12/12/12 0757 12/13/12 0814  WBC 4.4 3.1* 3.6*  HGB 11.9* 12.5* 10.7*  HCT 35.7* 36.9* 31.5*  MCV 89.5 87.9 86.3  PLT 121* 109* 120*    Cardiac Enzymes: No results found for this basename: CKTOTAL,  CKMB, CKMBINDEX, TROPONINI,  in the last 168 hours  BNP: No components found with this basename: POCBNP,   CBG:  Recent Labs Lab 12/13/12 1204  GLUCAP 89    Microbiology: Results for orders placed during the hospital encounter of 12/11/12  URINE CULTURE     Status: None   Collection Time    12/11/12  1:38 PM      Result Value Range Status   Specimen Description URINE, CLEAN CATCH   Final   Special Requests NONE   Final   Culture  Setup Time     Final   Value: 12/12/2012 02:33     Performed at [place]     Final   Value: >=100,000 COLONIES/ML     Performed at [facility]   Culture     Final   Value: GRAM NEGATIVE RODS     Performed at [facility]   Report Status PENDING   Incomplete    Coagulation Studies: No results found for this basename: LABPROT, INR,  in the last  72 hours  Urinalysis:  Recent Labs  12/11/12 1338  COLORURINE YELLOW  LABSPEC 1.014  PHURINE 8.0  GLUCOSEU NEGATIVE  HGBUR MODERATE*  BILIRUBINUR NEGATIVE  KETONESUR NEGATIVE  PROTEINUR >300*  UROBILINOGEN 0.2  NITRITE NEGATIVE  LEUKOCYTESUR LARGE*      Imaging: No results found.   Medications:     . atenolol  150 mg Oral Daily  . calcitRIOL  1 mcg Oral BID  . calcium acetate  1,334 mg Oral TID WC  . furosemide  80 mg Intravenous Q8H  . heparin  5,000 Units Subcutaneous Q8H  . predniSONE  5 mg Oral Daily  . sodium bicarbonate  1,300 mg Oral BID  . sodium chloride  3 mL Intravenous Q12H  . tuberculin  5 Units Intradermal Once   sodium chloride, labetalol, oxyCODONE-acetaminophen, polyethylene glycol, sodium chloride  Assessment/ Plan:   ESRD- new start plan dialysis [place]   ANEMIA- no ESA at present Hb >10  MBD- start IV hectoral  HTN/VOL- still edematous remove fluid and challenge weight  ACCESS- AVF placed and functioning well    LOS: 3 Gregory [name] W @TODAY @10 :31 AM

## 2012-12-15 DIAGNOSIS — N186 End stage renal disease: Secondary | ICD-10-CM

## 2012-12-15 LAB — CBC
HCT: 29.7 % — ABNORMAL LOW (ref 39.0–52.0)
Hemoglobin: 9.9 g/dL — ABNORMAL LOW (ref 13.0–17.0)
MCHC: 33.3 g/dL (ref 30.0–36.0)
RBC: 3.37 MIL/uL — ABNORMAL LOW (ref 4.22–5.81)

## 2012-12-15 LAB — RENAL FUNCTION PANEL
BUN: 94 mg/dL — ABNORMAL HIGH (ref 6–23)
CO2: 20 mEq/L (ref 19–32)
Chloride: 106 mEq/L (ref 96–112)
Glucose, Bld: 125 mg/dL — ABNORMAL HIGH (ref 70–99)
Phosphorus: 7.8 mg/dL — ABNORMAL HIGH (ref 2.3–4.6)
Potassium: 3.9 mEq/L (ref 3.5–5.1)

## 2012-12-15 LAB — URINE CULTURE: Colony Count: >=100000

## 2012-12-15 MED ORDER — DOXERCALCIFEROL 4 MCG/2ML IV SOLN
INTRAVENOUS | Status: AC
  Filled 2012-12-15: qty 2

## 2012-12-15 NOTE — Consult Note (Signed)
Vascular Surgery Consultation  Reason for Consult: Prominent vein and proximal right upper extremity-patient with right upper extremity AV fistula on hemodialysis  HPI: Gregory [name] is a 62 y.o. male who presents for evaluation of prominent vein right upper extremity. This patient had an AV fistula created in 2011 has never been on hemodialysis until 2 days ago. Apparently the initial treatment of dialysis went well. He had a second treatment yesterday which apparently was associated with some venous hypertension and a prominent vein noted in the proximal right upper arm. The patient denies any pain or symptoms. He has had no pain or numbness in the right hand.   Past Medical History  Diagnosis Date  . Renal disorder   . Hypertension   . CHF (congestive heart failure)   . Arthritis   . Chronic back pain    History reviewed. No pertinent past surgical history. History   Social History  . Marital Status: Married    Spouse Name: N/A    Number of Children: N/A  . Years of Education: N/A   Social History Main Topics  . Smoking status: Never Smoker   . Smokeless tobacco: None  . Alcohol Use: No  . Drug Use: No  . Sexual Activity: None   Other Topics Concern  . None   Social History Narrative  . None   No family history on file. No Known Allergies Prior to Admission medications   Medication Sig Start Date End Date Taking? Authorizing Provider  atenolol (TENORMIN) 100 MG tablet Take 150 mg by mouth daily.   Yes Historical Provider, MD  calcitRIOL (ROCALTROL) 0.25 MCG capsule Take 1 mcg by mouth 2 (two) times daily.   Yes Historical Provider, MD  oxyCODONE-acetaminophen (PERCOCET) 7.5-325 MG per tablet Take 1 tablet by mouth 2 (two) times daily as needed for pain.   Yes Historical Provider, MD  predniSONE (DELTASONE) 5 MG tablet Take 5 mg by mouth daily.   Yes Historical Provider, MD  cephALEXin (KEFLEX) 500 MG capsule Take 1 capsule (500 mg total) by mouth 4 (four) times  daily. 12/11/12   Domenic Moras, PA-C     Positive ROS: Denies chest pain, was admitted because of diffuse edema. No symptoms in right upper extremity  All other systems have been reviewed and were otherwise negative with the exception of those mentioned in the HPI and as above.  Physical Exam: Filed Vitals:   12/15/12 0700  BP: 162/101  Pulse: 58  Temp:   Resp: 20    General: Alert, no acute distress HEENT: Normal for age Cardiovascular: Regular rate and rhythm. Carotid pulses 2+, no bruits audible Respiratory: Clear to auscultation. No cyanosis, no use of accessory musculature GI: No organomegaly, abdomen is soft and non-tender Skin: No lesions in the area of chief complaint Neurologic: Sensation intact distally Psychiatric: Patient is competent for consent with normal mood and affect Musculoskeletal: No obvious deformities Extremities: Right upper extremity with patent brachial/cephalic AV fistula. 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 Cephalic vein is quite prominent and dilated beneath the skin up to the clavicle. There is no tenderness to palpation. Radial artery has 2+ pulse and there is no evidence of ischemia in the right hand.   Assessment/Plan:  Patient with end-stage renal disease with patent right brachiocephalic AV fistula and diffuse dilatation including proximal cephalic vein Patient could have central vein stenosis or occlusion Would recommend continuing to use this  for hemodialysis as long as he functions adequately. If it is not functioning adequately because of high venous pressures and fistulogram should be performed to which we can do Patient is asymptomatic as long as fistula is functioning well would not proceed with any further evaluation of this at this time  Tinnie Gens, MD 12/15/2012 1:13 PM

## 2012-12-15 NOTE — Progress Notes (Signed)
12/15/2012 11:56 AM Hemodialysis Outpatient Note; this patient has been accepted at the [facility] dialysis center on  Tues/Thurs/Sat 2nd shift schedule. Your first day will be Thursday August 14th at 10:30 AM. If your discharge is delayed you may begin treatment on Saturday August 16th at 12:00 PM but only if you can visit the center on Friday August 13th at 2:00 pm to sign paperwork. This may be a little confusing, please feel free to call the center at [phone] or myself at ext [phone] with any questions. Thank you. Gordy Savers

## 2012-12-15 NOTE — Progress Notes (Addendum)
Family Medicine Teaching Service Daily Progress Note Intern Pager: [phone]  Patient name: Gregory [name] Medical record number: [id number] Date of birth: Jan 12, 1951 Age: 62 y.o. Gender: male  Primary Care Provider: No primary provider on file. Consultants: Nephrology Code Status: full code  Pt Overview and Major Events to Date:  8/9 - admitted with diffuse anasarca, no PCP 8/11 - s/p dialysis  Assessment and Plan: Gregory [name] is a 62 y.o. year old male presenting with no acute complaints, but poorly compliant as outpatient with CKD stage V. Diffuse anasarca.  # ESRD - currently on dialysis  - no previous lab work in our system for baseline but most likely at baseline levels of BUN/Cr. Current BUN/Cr: 96/8.66 (8/12). Weight: 242lbs (252lbs on admission). - Continue Prednisone 5 mg - unclear why he is on this but continue for now  - metabolic anion gap acidosis, likely secondary to uremia, gap decreased to 15 with a normal bicarb of 21 - net I/O: -611ml - UOP last 24 hours: 358ml -fistula became tortuous during dialysis today and will be assessed by VSS [ ]  f/u urine output [ ]  strict I/O, daily weights [ ]  f/u nephrology recs [ ]  follow-up VSS recs for fistula  # Hyperkalemia: K is 5.1 today (5.3 on admission) [ ]  f/u bmet  # HTN: BPs have been stable in 130s/70-80s (one episode 175/120) - continue home atenolol - Echo (8/10) - Biventricular dysfunction (right worse than left); EF: 30-35%  # Report of skin breakdown on buttock: - stage 3 pressure ulcer with stage 2 epithelial buds - air mattress and chair pressure redistribution pad  # Back pain:  - increased percocet to q4 PRN administration  FEN/GI: renal diet  Prophylaxis: SubQ heparin  Disposition: pending nephrology recommendations  Subjective: Feels well this morning, continues without complaints. No chest pain or shortness of breath. Waiting for dialysis this afternoon  Objective: Temp:  [97.4 F (36.3  C)-98.2 F (36.8 C)] 98.1 F (36.7 C) (08/13 0640) Pulse Rate:  [53-67] 58 (08/13 0700) Resp:  [15-20] 20 (08/13 0700) BP: (128-162)/(80-101) 162/101 mmHg (08/13 0700) SpO2:  [96 %-100 %] 99 % (08/13 0700) Weight:  [242 lb 1 oz (109.8 kg)-251 lb 1.7 oz (113.901 kg)] 242 lb 1 oz (109.8 kg) (08/13 0640)  Physical Exam: General: NAD, lying in bed, wife and sister in the room with him Cardiovascular: RRR, no murmurs Respiratory: CTAB, NWOB Abdomen: soft, nontender to palpation, +BS Extremities: 3+ edema in arms bilaterally with decreased skin tightness which continues to improve. 4+ edema legs bilaterally.  calves nontender to palpation, fistula in right arm with palpable thrill  Laboratory:  Recent Labs Lab 12/12/12 0757 12/13/12 0814 12/15/12 0710  WBC 3.1* 3.6* 4.1  HGB 12.5* 10.7* 9.9*  HCT 36.9* 31.5* 29.7*  PLT 109* 120* 104*    Recent Labs Lab 12/11/12 1318  12/12/12 1826 12/13/12 0813 12/14/12 0527  NA 141  < > 141 141 142  K 4.9  < > 4.0 4.3 5.1  CL 106  < > 106 107 106  CO2 14*  < > 17* 15* 21  BUN 108*  < > 110* 108* 96*  CREATININE 9.71*  < > 9.30* 9.59* 8.66*  CALCIUM 7.9*  < > 7.7* 7.7* 8.3*  PROT 5.2*  --   --   --   --   BILITOT 0.6  --   --   --   --   ALKPHOS 102  --   --   --   --  ALT 17  --   --   --   --   AST 19  --   --   --   --   GLUCOSE 87  < > 77 92 102*  < > = values in this interval not displayed.  Imaging/Diagnostic Tests: No new studies today  Cordelia Poche, MD 12/15/2012, 12:39 PM PGY-1, [place] Intern pager: [phone], text pages welcome

## 2012-12-15 NOTE — Evaluation (Signed)
Physical Therapy Evaluation Patient Details Name: Gregory [name] MRN: [id number] DOB: 11-24-1950 Today's Date: 12/15/2012 Time: [id number] PT Time Calculation (min): 37 min  PT Assessment / Plan / Recommendation History of Present Illness  Pt. was admitted with swelling in his arms and legs and CKD 5, ?PNA.  Pt. also found to have UTI and is new start on HD this admission.  Clinical Impression  Pt. Presents with increasing weakness recently and significant current weakness, decreased independence in functional mobility including gait, transfers and bed mobility.  He needs acute PT to address these and below issues in preparation for next venue of care.  He will likely need post acute rehab and at this time, SNF for rehab seems most appropriate venue for this patient.    PT Assessment  Patient needs continued PT services    Follow Up Recommendations  Supervision/Assistance - 24 hour;Supervision for mobility/OOB;SNF    Does the patient have the potential to tolerate intense rehabilitation      Barriers to Discharge Decreased caregiver support;Other (comment) (increased need for assist due to weakness)      Equipment Recommendations  Rolling walker with 5" wheels;Wheelchair (measurements PT);Wheelchair cushion (measurements PT)    Recommendations for Other Services     Frequency Min 3X/week    Precautions / Restrictions Precautions Precautions: Fall Restrictions Weight Bearing Restrictions: No   Pertinent Vitals/Pain See vitals tab       Mobility  Bed Mobility Bed Mobility: Supine to Sit;Sitting - Scoot to Edge of Bed;Sit to Supine Supine to Sit: 4: Min assist;HOB elevated;With rails Sitting - Scoot to Edge of Bed: 4: Min assist Sit to Supine: 3: Mod assist;HOB flat;With rail Details for Bed Mobility Assistance: Pt. needed min to mod assist for transitions supine <> sit due to weakness in LEs. Transfers Transfers: Sit to Stand;Stand to Sit Details for Transfer Assistance:  3 attempts to rise to stand but unsuccessful due to LE weakness. Pt. couldn't even bwegin to clear his buttocks with 1 total assist. Ambulation/Gait Ambulation/Gait Assistance: Not tested (comment)    Exercises General Exercises - Lower Extremity Ankle Circles/Pumps: AROM;Both;10 reps Quad Sets: AROM;Both;10 reps Short Arc Quad: AROM;Both;10 reps Heel Slides: Both;5 reps;AROM   PT Diagnosis: Difficulty walking  PT Problem List: Decreased strength;Decreased activity tolerance;Decreased mobility;Decreased knowledge of use of DME;Obesity;Pain;Decreased balance PT Treatment Interventions: DME instruction;Gait training;Functional mobility training;Therapeutic activities;Therapeutic exercise;Patient/family education;Balance training     PT Goals(Current goals can be found in the care plan section) Acute Rehab PT Goals Patient Stated Goal: to get his legs stronger so he can resume walking PT Goal Formulation: With patient Time For Goal Achievement: 12/29/12 Potential to Achieve Goals: Fair  Visit Information  Last PT Received On: 12/15/12 Assistance Needed: +2 History of Present Illness: Pt. was admitted with swelling in his arms and legs and CKD 5, ?PNA.  Pt. also found to haave UTI and is new start on HD this admission.       Prior [place] expects to be discharged to:: Private residence Living Arrangements: Spouse/significant other Available Help at Discharge: Family Type of Home: House Home Access: Stairs to enter Technical brewer of Steps: 3 Entrance Stairs-Rails: None Home Layout: One level [place] - single point Prior Function Level of Independence: Independent with assistive device(s) (wife reports increasing weakness in past few days) Communication Communication: No difficulties Dominant Hand: Right    Cognition  Cognition Arousal/Alertness: Awake/alert Behavior During Therapy: WFL for tasks  assessed/performed Overall Cognitive Status:  Within Functional Limits for tasks assessed Memory: Decreased short-term memory (inaccuracies reported by pt. per wife)    Extremity/Trunk Assessment Upper Extremity Assessment Upper Extremity Assessment: RUE deficits/detail RUE Deficits / Details: Pt. with significant sewlling in R UE and has ice bag on.  He had diffuculties with access this am and is to undergo testing of fistula, therefore not assessed by PT. Lower Extremity Assessment Lower Extremity Assessment: Generalized weakness;RLE deficits/detail;LLE deficits/detail RLE Deficits / Details: strength grossly 3/5 LLE Deficits / Details: strength grossly 3/5   Balance Balance Balance Assessed: Yes Static Sitting Balance Static Sitting - Balance Support: Feet supported;No upper extremity supported Static Sitting - Level of Assistance: 5: Stand by assistance  End of Session PT - End of Session Equipment Utilized During Treatment: Gait belt Activity Tolerance: Patient limited by fatigue Patient left: in bed;with call bell/phone within reach;with family/visitor present Nurse Communication: Mobility status  GP     Ladona Ridgel 12/15/2012, 11:00 AM [facility] [place] [phone]

## 2012-12-15 NOTE — Clinical Documentation Improvement (Signed)
THIS DOCUMENT IS NOT A PERMANENT PART OF THE MEDICAL RECORD  Based on your clinical judgment, please clarify TYPE & ACUITY and document in a progress note and/or discharge summary the clinical condition associated with the following supporting information:  In responding to this query please exercise your independent judgment. The fact that a query is asked, does not imply that any particular answer is desired or expected.  Possible Clinical Conditions?  Chronic Systolic Congestive Heart Failure  Chronic Diastolic Congestive Heart Failure  Chronic Systolic & Diastolic Congestive Heart Failure  Acute Systolic Congestive Heart Failure  Acute Diastolic Congestive Heart Failure  Acute Systolic & Diastolic Congestive Heart Failure  Acute on Chronic Systolic Congestive Heart Failure  Acute on Chronic Diastolic Congestive Heart Failure  [x]  Acute on Chronic Systolic & Diastolic Congestive Heart Failure Other Condition_____________  Cannot Clinically Determine    Supporting Information:  Risk Factors:(As per notes) "Pt has CHF, ESRD"  Signs & Symptoms: (As per notes) "Diffuse anasarca"   Diagnostics:  ECHO:  Study Conclusions  - Left ventricle: The cavity size was mildly &dilated. Wall  thickness was increased in a pattern of moderate LVH.  Systolic function was moderately to severely reduced. The  estimated ejection fraction was in the range of 30% to  35%. Diffuse hypokinesis. Doppler parameters are  consistent with abnormal left ventricular relaxation  (grade 1 diastolic dysfunction). Doppler parameters are  consistent with high ventricular filling pressure.  - Ventricular septum: The contour showed diastolic  flattening and systolic flattening.  - Mitral valve: Calcified annulus. Mild regurgitation.  - Left atrium: The atrium was mildly &dilated.  - Right ventricle: The cavity size was severely &dilated.  Systolic function was severely reduced.  - Right atrium: The atrium was  severely &dilated.  - Pulmonary arteries: PA peak pressure: 44mm Hg (S).  - Pericardium, extracardiac: There was a left pleural  effusion.  -  Impressions:There is biventricular dysfunction with RV  dyfunction worse than LV.  Impressions:- There is biventricular dysfunction with RV dyfunction  worse than LV.  CXR 12/11/12  IMPRESSION: Cardiomegaly with small bilateral pleural effusions and bilateral lower lung atelectasis/airspace disease. Pneumonia is not excluded.   Treatment:  furosemide (LASIX) injection 80 mg [id number]     Ordered Dose: 80 mg  Route: Intravenous  Frequency: 3 times per day   Reviewed: additional documentation in the medical record  -see the above  Reviewed: additional documentation in the medical record -see separate progress note for today  Emmaline Kluver, MD PGY-2, [facility] 12/15/2012, 1:44 PM [place] [facility] pager: [phone] (text pages welcome through [facility])

## 2012-12-15 NOTE — Progress Notes (Signed)
PGY-2 Update Note  Pt has diffuse swelling/volume overload, now ESRD on hemodialysis. Pt does have systolic and diastolic biventricular dysfunction (congestive heart failure) with EF of 30-35%, so current status could have a component of acute-on-chronic failure.  This note is in addition to Dr. Lisbeth Ply daily progress notes, cosigned by Dr. Wendy Poet, and is also in response to clinical documentation inquiry (see note signed by me on 8/12, 9:42 AM).  Emmaline Kluver, MD PGY-2, [facility] 12/15/2012, 1:38 PM [place] [facility] pager: [phone] (text pages welcome through [facility])

## 2012-12-15 NOTE — Procedures (Signed)
I have seen and examined this patient and agree with the plan of care . Patient seen on dialysis. RN concerned about tortuous and extensive fistula extending above clavicle. I will have VVS evaluate this morning.  Lamount Bankson W 12/15/2012, 8:15 AM

## 2012-12-15 NOTE — Progress Notes (Signed)
Patient graft assessed. Graft is positive for thrill and bruit. Patient cannulated with 17gauge needles with good push and pull. No pain or pressure noted. Treatment started with BFR of 145ml/hr. Arterial pressure noted to be 30 with venous pressure of 80. BFR advanced to 274ml/hr with increased venous pressure of 250. Venous side of avg became engorged up to the patients collar bone. Patient had no complaints of pain at this time, but visibly disturbing along with high venous pressure. Treatment discontinued. Needles removed and ice applied. Dr Justin Mend notified

## 2012-12-16 LAB — RENAL FUNCTION PANEL
CO2: 22 mEq/L (ref 19–32)
Chloride: 104 mEq/L (ref 96–112)
GFR calc Af Amer: 7 mL/min — ABNORMAL LOW (ref >90)
GFR calc non Af Amer: 6 mL/min — ABNORMAL LOW (ref >90)
Glucose, Bld: 96 mg/dL (ref 70–99)
Potassium: 4 mEq/L (ref 3.5–5.1)
Sodium: 140 mEq/L (ref 135–145)

## 2012-12-16 LAB — CBC
HCT: 27 % — ABNORMAL LOW (ref 39.0–52.0)
Hemoglobin: 8.9 g/dL — ABNORMAL LOW (ref 13.0–17.0)
RBC: 3.06 MIL/uL — ABNORMAL LOW (ref 4.22–5.81)
WBC: 3.8 10*3/uL — ABNORMAL LOW (ref 4.0–10.5)

## 2012-12-16 LAB — GLUCOSE, CAPILLARY: Glucose-Capillary: 86 mg/dL (ref 70–99)

## 2012-12-16 MED ORDER — RENA-VITE PO TABS
1.0000 | ORAL_TABLET | Freq: Every day | ORAL | Status: DC
  Administered 2012-12-16 – 2012-12-19 (×4): 1 via ORAL
  Filled 2012-12-16 (×5): qty 1

## 2012-12-16 MED ORDER — DARBEPOETIN ALFA-POLYSORBATE 60 MCG/0.3ML IJ SOLN
60.0000 ug | INTRAMUSCULAR | Status: DC
  Filled 2012-12-16: qty 0.3

## 2012-12-16 MED ORDER — SODIUM CHLORIDE 0.9 % IV SOLN
125.0000 mg | INTRAVENOUS | Status: DC
  Administered 2012-12-17: 125 mg via INTRAVENOUS
  Filled 2012-12-16 (×2): qty 10

## 2012-12-16 NOTE — Progress Notes (Signed)
I discussed with  Dr Nettey.  I agree with their plans documented in their progress note for today.  

## 2012-12-16 NOTE — Evaluation (Signed)
Occupational Therapy Evaluation Patient Details Name: Gregory [name] MRN: [id number] DOB: 1951-03-31 Today's Date: 12/16/2012 Time: [id number] OT Time Calculation (min): 25 min  OT Assessment / Plan / Recommendation History of present illness Pt. was admitted with swelling in his arms and legs and CKD 5, ?PNA.  Pt. also found to haave UTI and is new start on HD this admission.   Clinical Impression   Pt unable to stand or transfer.  Limitations in UE strength and ROM bilaterally.  Pt with dependence in bathing and dressing.  Will need post acute rehab to facilitate return home with wife.    OT Assessment  Patient needs continued OT Services    Follow Up Recommendations  SNF    Barriers to Discharge      Equipment Recommendations  3 in 1 bedside comode;Wheelchair (measurements OT);Wheelchair cushion (measurements OT)    Recommendations for Other Services    Frequency  Min 2X/week    Precautions / Restrictions Precautions Precautions: Fall Restrictions Weight Bearing Restrictions: No   Pertinent Vitals/Pain VSS, no c/o pain.    ADL  Eating/Feeding: Independent Where Assessed - Eating/Feeding: Bed level Grooming: Wash/dry hands;Wash/dry face;Set up Where Assessed - Grooming: Supine, head of bed up Upper Body Bathing: Minimal assistance Where Assessed - Upper Body Bathing: Unsupported sitting Lower Body Bathing: +1 Total assistance Where Assessed - Lower Body Bathing: Rolling right and/or left;Lean right and/or left Upper Body Dressing: Minimal assistance Where Assessed - Upper Body Dressing: Unsupported sitting Lower Body Dressing: +1 Total assistance Where Assessed - Lower Body Dressing: Rolling right and/or left Transfers/Ambulation Related to ADLs: non ambulatory    OT Diagnosis: Generalized weakness  OT Problem List: Decreased strength;Decreased activity tolerance;Impaired balance (sitting and/or standing);Decreased knowledge of use of DME or AE;Impaired UE  functional use;Decreased range of motion OT Treatment Interventions: Self-care/ADL training;Therapeutic exercise;Therapeutic activities;Patient/family education;DME and/or AE instruction   OT Goals(Current goals can be found in the care plan section) Acute Rehab OT Goals Patient Stated Goal: to get his legs stronger so he can resume walking OT Goal Formulation: With patient Time For Goal Achievement: 12/30/12 Potential to Achieve Goals: Good ADL Goals Pt Will Transfer to Toilet: with +2 assist;stand pivot transfer;bedside commode (50%) Pt Will Perform Toileting - Clothing Manipulation and hygiene: with 2+ total assist;sit to/from stand (50%) Additional ADL Goal #1: Pt will perform bed mobility with supervision to EOB for ADL.  Visit Information  Last OT Received On: 12/16/12 Assistance Needed: +2 History of Present Illness: Pt. was admitted with swelling in his arms and legs and CKD 5, ?PNA.  Pt. also found to haave UTI and is new start on HD this admission.       Prior [place] expects to be discharged to:: Skilled nursing facility Living Arrangements: Spouse/significant other Available Help at Discharge: Family;Available 24 hours/day Type of Home: House Home Access: Stairs to enter [employer] of Steps: 3 Entrance Stairs-Rails: None Home Layout: One level Home Equipment: Cane - single point;Toilet riser Prior Function Level of Independence: Independent with assistive device(s) Communication Communication: No difficulties Dominant Hand: Right         Vision/Perception Vision - History Baseline Vision: Wears glasses all the time Patient Visual Report: No change from baseline   Cognition  Cognition Arousal/Alertness: Awake/alert Behavior During Therapy: Flat affect Overall Cognitive Status: Within Functional Limits for tasks assessed Memory: Decreased short-term memory    Extremity/Trunk Assessment Upper Extremity  Assessment Upper Extremity Assessment: LUE deficits/detail;RUE deficits/detail  RUE Deficits / Details: FF to 90 degrees 3-/5 strengtj, full AROM elbow to hand, 4/5 strength LUE Deficits / Details: FF to 90 degrees 3-/5 strength, 4+/5 elbow to hand. (Pt reports L side has longstanding weakness.)     Mobility Bed Mobility Bed Mobility: Supine to Sit;Sitting - Scoot to Edge of Bed;Sit to Supine Supine to Sit: 4: Min assist;HOB elevated;With rails Sitting - Scoot to Edge of Bed: 4: Min assist Sit to Supine: 3: Mod assist;HOB flat;With rail Details for Bed Mobility Assistance: assist for LEs Transfers Transfers: Not assessed (unable to stand)     Exercise     Balance Balance Balance Assessed: Yes Static Sitting Balance Static Sitting - Balance Support: Feet supported;No upper extremity supported Static Sitting - Level of Assistance: 5: Stand by assistance Static Sitting - Comment/# of Minutes: 10   End of Session OT - End of Session Activity Tolerance: Patient tolerated treatment well Patient left: in bed;with call bell/phone within reach;with family/visitor present  GO     Malka So 12/16/2012, 10:38 AM [phone]

## 2012-12-16 NOTE — Progress Notes (Signed)
Patient wife would like to speak with Dr. Justin Mend regarding the multiple complications he has been having with his fistula. Will give the information to the day shift RN.  [name] Skaggs RN [phone]

## 2012-12-16 NOTE — Progress Notes (Signed)
Family Medicine Teaching Service Daily Progress Note Intern Pager: [phone]  Patient name: Gregory [name] Medical record number: [id number] Date of birth: 1950-10-27 Age: 62 y.o. Gender: male  Primary Care Provider: No primary provider on file. Consultants: Nephrology Code Status: full code  Pt Overview and Major Events to Date:  8/9 - admitted with diffuse anasarca, no PCP 8/11 - s/p dialysis 8/13 - dialysis today. VSS said fistula OK to use for HD  Assessment and Plan: Gregory [name] is a 62 y.o. year old male presenting with no acute complaints, but poorly compliant as outpatient with ESRD. Diffuse anasarca. Currently on dialysis M, W, F  # ESRD - currently on dialysis  - no previous lab work in our system for baseline but most likely at baseline levels of BUN/Cr. Current BUN/Cr: 96/8.66 (8/12). Weight: 243lbs (new bed) (252lbs on admission). Net I/O: -2.8L (-3L from dialysis). UOP (Last 24 hours): 14ml - Continue Prednisone 5 mg - unclear why he is on this but continue for now  - metabolic anion gap acidosis, likely secondary to uremia, gap decreased to 15 with a normal bicarb of 21 - VSS suggested no intervention for AV fistula unless it started to fail or the patient became symptomatic [ ]  f/u urine output [ ]  strict I/O, daily weights [ ]  f/u nephrology recs  # Hyperkalemia: K is 3.9 today (5.3 on admission). Stable [ ]  f/u bmet  # HTN: BPs have been stable in 110-130s/70-80s, has had bradycardia down to ~50bpm - continue home atenolol - Echo (8/10) - Biventricular dysfunction (right worse than left); EF: 30-35%  # Report of skin breakdown on buttock - stage 3 pressure ulcer with stage 2 epithelial buds - air mattress and chair pressure redistribution pad  # Back pain:  - increased percocet to q4 PRN administration  FEN/GI: renal diet  Prophylaxis: SubQ heparin  Disposition: pending dialysis tomorrow  Subjective: Feels well this morning, continues without  complaints. No chest pain or shortness of breath. Social work says that he needs to have dialysis in a chair before discharge.  Objective: Temp:  [97.3 F (36.3 C)-98.1 F (36.7 C)] 97.4 F (36.3 C) (08/14 0454) Pulse Rate:  [49-62] 55 (08/14 0454) Resp:  [15-22] 20 (08/14 0454) BP: (118-185)/(70-101) 118/74 mmHg (08/14 0454) SpO2:  [94 %-100 %] 99 % (08/14 0454) Weight:  [242 lb 1 oz (109.8 kg)-243 lb 5 oz (110.366 kg)] 243 lb 5 oz (110.366 kg) (08/13 2136)  Physical Exam: General: NAD, sitting in bed, wife and sister in the room with him Cardiovascular: RRR, no murmurs Respiratory: CTAB, NWOB Abdomen: soft, nontender to palpation, +BS Extremities: 3+ edema in arms bilaterally with decreased skin tightness  4+ edema legs bilaterally.  calves nontender to palpation, fistula in right arm with bruit and palpable thrill  Laboratory:  Recent Labs Lab 12/13/12 0814 12/15/12 0710 12/16/12 0450  WBC 3.6* 4.1 3.8*  HGB 10.7* 9.9* 8.9*  HCT 31.5* 29.7* 27.0*  PLT 120* 104* PENDING    Recent Labs Lab 12/11/12 1318  12/13/12 0813 12/14/12 0527 12/15/12 1440  NA 141  < > 141 142 140  K 4.9  < > 4.3 5.1 3.9  CL 106  < > 107 106 106  CO2 14*  < > 15* 21 20  BUN 108*  < > 108* 96* 94*  CREATININE 9.71*  < > 9.59* 8.66* 8.67*  CALCIUM 7.9*  < > 7.7* 8.3* 8.1*  PROT 5.2*  --   --   --   --  BILITOT 0.6  --   --   --   --   ALKPHOS 102  --   --   --   --   ALT 17  --   --   --   --   AST 19  --   --   --   --   GLUCOSE 87  < > 92 102* 125*  < > = values in this interval not displayed.  Imaging/Diagnostic Tests: No new studies today  Cordelia Poche, MD 12/16/2012, 6:20 AM PGY-1, [place] Intern pager: [phone], text pages welcome

## 2012-12-16 NOTE — Progress Notes (Signed)
[facility] ROUNDING NOTE   Subjective:   Interval History: awake and alert no complaints poor mobility looking at SNF  Objective:  Vital signs in last 24 hours:  Temp:  [97.3 F (36.3 C)-97.5 F (36.4 C)] 97.4 F (36.3 C) (08/14 0853) Pulse Rate:  [49-80] 80 (08/14 0853) Resp:  [16-22] 20 (08/14 0853) BP: (118-185)/(69-90) 119/69 mmHg (08/14 0853) SpO2:  [94 %-100 %] 95 % (08/14 0853) Weight:  [109.9 kg (242 lb 4.6 oz)-110.366 kg (243 lb 5 oz)] 110.366 kg (243 lb 5 oz) (08/13 2136)  Weight change: -4.001 kg (-8 lb 13.1 oz) Filed Weights   12/15/12 0640 12/15/12 1423 12/15/12 2136  Weight: 109.8 kg (242 lb 1 oz) 109.9 kg (242 lb 4.6 oz) 110.366 kg (243 lb 5 oz)    Intake/Output: I/O last 3 completed shifts: In: 480 [P.O.:480] Out: 3250 [Urine:250; Other:3000]   Intake/Output this shift:     CVS- RRR RS- CTA ABD- BS present soft non-distended EXT- 2+ edema   Basic Metabolic Panel:  Recent Labs Lab 12/12/12 0757 12/12/12 1826 12/13/12 0813 12/14/12 0527 12/15/12 1440 12/16/12 0450  NA 142 141 141 142 140 140  K 5.3* 4.0 4.3 5.1 3.9 4.0  CL 108 106 107 106 106 104  CO2 12* 17* 15* 21 20 22   GLUCOSE 84 77 92 102* 125* 96  BUN 112* 110* 108* 96* 94* 84*  CREATININE 9.88* 9.30* 9.59* 8.66* 8.67* 7.85*  CALCIUM 8.0* 7.7* 7.7* 8.3* 8.1* 7.9*  PHOS 8.8*  --  8.3* 7.8* 7.8* 7.2*    Liver Function Tests:  Recent Labs Lab 12/11/12 1318 12/12/12 0757 12/13/12 0813 12/14/12 0527 12/15/12 1440 12/16/12 0450  AST 19  --   --   --   --   --   ALT 17  --   --   --   --   --   ALKPHOS 102  --   --   --   --   --   BILITOT 0.6  --   --   --   --   --   PROT 5.2*  --   --   --   --   --   ALBUMIN 2.8* 2.8* 2.5* 2.6* 2.4* 2.3*   No results found for this basename: LIPASE, AMYLASE,  in the last 168 hours No results found for this basename: AMMONIA,  in the last 168 hours  CBC:  Recent Labs Lab 12/11/12 1318 12/12/12 0757 12/13/12 0814  12/15/12 0710 12/16/12 0450  WBC 4.4 3.1* 3.6* 4.1 3.8*  HGB 11.9* 12.5* 10.7* 9.9* 8.9*  HCT 35.7* 36.9* 31.5* 29.7* 27.0*  MCV 89.5 87.9 86.3 88.1 88.2  PLT 121* 109* 120* 104* 59*    Cardiac Enzymes: No results found for this basename: CKTOTAL, CKMB, CKMBINDEX, TROPONINI,  in the last 168 hours  BNP: No components found with this basename: POCBNP,   CBG:  Recent Labs Lab 12/13/12 1204 12/16/12 0818  GLUCAP 89 86    Microbiology: Results for orders placed during the hospital encounter of 12/11/12  URINE CULTURE     Status: None   Collection Time    12/11/12  1:38 PM      Result Value Range Status   Specimen Description URINE, CLEAN CATCH   Final   Special Requests NONE   Final   Culture  Setup Time     Final   Value: 12/12/2012 02:33     Performed at [facility]  Colony Count     Final   Value: >=100,000 COLONIES/ML     Performed at [facility]   Culture     Final   Value: CITROBACTER KOSERI     Note: Two isolates with different morphologies were identified as the same organism.The most resistant organism was reported.     Performed at [facility]   Report Status 12/15/2012 FINAL   Final   Organism ID, Bacteria CITROBACTER KOSERI   Final    Coagulation Studies: No results found for this basename: LABPROT, INR,  in the last 72 hours  Urinalysis: No results found for this basename: COLORURINE, APPERANCEUR, LABSPEC, PHURINE, GLUCOSEU, HGBUR, BILIRUBINUR, KETONESUR, PROTEINUR, UROBILINOGEN, NITRITE, LEUKOCYTESUR,  in the last 72 hours    Imaging: No results found.   Medications:     . atenolol  150 mg Oral Daily  . calcium acetate  1,334 mg Oral TID WC  . doxercalciferol  4 mcg Intravenous Q M,[name],F-HD  . predniSONE  5 mg Oral Daily  . sodium chloride  3 mL Intravenous Q12H   sodium chloride, oxyCODONE-acetaminophen, polyethylene glycol, sodium chloride  Assessment/ Plan:  1.ESRD- new start plan [place]  2. ANEMIA- start ESA and Iron   MBD- start IV hectoral  HTN/VOL- still edematous remove fluid and challenge weight  ACCESS- AVF placed and functioning well  SNF placement       LOS: 5 Gregory [name] @TODAY @11 :06 AM

## 2012-12-17 DIAGNOSIS — Z992 Dependence on renal dialysis: Secondary | ICD-10-CM | POA: Diagnosis present

## 2012-12-17 DIAGNOSIS — Z789 Other specified health status: Secondary | ICD-10-CM | POA: Diagnosis present

## 2012-12-17 LAB — CBC
Hemoglobin: 9.3 g/dL — ABNORMAL LOW (ref 13.0–17.0)
MCH: 30.2 pg (ref 26.0–34.0)
MCHC: 33.8 g/dL (ref 30.0–36.0)

## 2012-12-17 LAB — RENAL FUNCTION PANEL
Albumin: 2.5 g/dL — ABNORMAL LOW (ref 3.5–5.2)
CO2: 21 mEq/L (ref 19–32)
Calcium: 7.9 mg/dL — ABNORMAL LOW (ref 8.4–10.5)
Creatinine, Ser: 7.72 mg/dL — ABNORMAL HIGH (ref 0.50–1.35)
GFR calc non Af Amer: 7 mL/min — ABNORMAL LOW (ref >90)

## 2012-12-17 MED ORDER — PENTAFLUOROPROP-TETRAFLUOROETH EX AERO: 1.0000 "application " | INHALATION_SPRAY | CUTANEOUS | Status: DC | PRN

## 2012-12-17 MED ORDER — SODIUM CHLORIDE 0.9 % IV SOLN: 100.0000 mL | INTRAVENOUS | Status: DC | PRN

## 2012-12-17 MED ORDER — LIDOCAINE HCL (PF) 1 % IJ SOLN: 5.0000 mL | INTRAMUSCULAR | Status: DC | PRN

## 2012-12-17 MED ORDER — NEPRO/CARBSTEADY PO LIQD: 237.0000 mL | ORAL | Status: DC | PRN

## 2012-12-17 MED ORDER — HEPARIN SODIUM (PORCINE) 1000 UNIT/ML DIALYSIS
1000.0000 [IU] | INTRAMUSCULAR | Status: DC | PRN
Start: 2012-12-17 — End: 2012-12-17

## 2012-12-17 MED ORDER — LIDOCAINE-PRILOCAINE 2.5-2.5 % EX CREA: 1.0000 "application " | TOPICAL_CREAM | CUTANEOUS | Status: DC | PRN

## 2012-12-17 MED ORDER — DOXERCALCIFEROL 4 MCG/2ML IV SOLN
INTRAVENOUS | Status: AC
  Administered 2012-12-17: 4 ug via INTRAVENOUS
  Filled 2012-12-17: qty 2

## 2012-12-17 MED ORDER — ALTEPLASE 2 MG IJ SOLR
2.0000 mg | Freq: Once | INTRAMUSCULAR | Status: DC | PRN
  Filled 2012-12-17: qty 2

## 2012-12-17 MED ORDER — DARBEPOETIN ALFA-POLYSORBATE 60 MCG/0.3ML IJ SOLN
INTRAMUSCULAR | Status: AC
  Administered 2012-12-17: 60 ug via INTRAVENOUS
  Filled 2012-12-17: qty 0.3

## 2012-12-17 NOTE — Progress Notes (Signed)
I have seen and examined this patient. I have discussed with Dr Nettey.  I agree with their findings and plans as documented in their progress note.    

## 2012-12-17 NOTE — Progress Notes (Signed)
Physical Therapy Treatment Patient Details Name: Gregory [name] MRN: [id number] DOB: [birth year] Today's Date: 12/17/2012 Time: [id number] PT Time Calculation (min): 28 min  PT Assessment / Plan / Recommendation  History of Present Illness Pt. was admitted with swelling in his arms and legs and CKD 5, ?PNA.  Pt. also found to have UTI and is new start on HD this admission.   PT Comments   Pt able to ambulate today short distance with seated rest break as well as perform exercises.  Pt still requires assist for transfers and gait so continue to recommend ST-SNF prior to home.  Follow Up Recommendations  Supervision/Assistance - 24 hour;SNF     Does the patient have the potential to tolerate intense rehabilitation     Barriers to Discharge        Equipment Recommendations  Rolling walker with 5" wheels;Wheelchair (measurements PT);Wheelchair cushion (measurements PT)    Recommendations for Other Services    Frequency     Progress towards PT Goals Progress towards PT goals: Progressing toward goals  Plan Current plan remains appropriate    Precautions / Restrictions Precautions Precautions: Fall   Pertinent Vitals/Pain n/a    Mobility  Bed Mobility Bed Mobility: Supine to Sit Supine to Sit: 4: Min assist;HOB elevated;With rails Details for Bed Mobility Assistance: assist for LEs Transfers Transfers: Sit to Stand;Stand to Sit Sit to Stand: 1: +2 Total assist;With upper extremity assist;From chair/3-in-1;From bed Sit to Stand: Patient Percentage: 60% Stand to Sit: 4: Min assist;With upper extremity assist;To chair/3-in-1 Details for Transfer Assistance: verbal cues for hand placement and forward lean, pt required assist to rise and steady Ambulation/Gait Ambulation/Gait Assistance: 4: Min assist Ambulation Distance (Feet): 40 Feet (x2) Assistive device: Rolling walker Ambulation/Gait Assistance Details: verbal cues for safe technique with RW as well and sequence initially, pt  reports SOB and fatigue limiting distance Gait Pattern: Step-through pattern;Decreased stride length;Trunk flexed;Wide base of support Gait velocity: decreased General Gait Details: SaO2 99% room air upon seated rest break during ambulation and return to room    Exercises General Exercises - Lower Extremity Ankle Circles/Pumps: AROM;Both;10 reps Long Arc Quad: AROM;Both;15 reps;Seated Hip ABduction/ADduction: AROM;Both;15 reps;Supine Straight Leg Raises: AAROM;Both;10 reps;Supine Hip Flexion/Marching: 15 reps;Both;AROM;Seated   PT Diagnosis:    PT Problem List:   PT Treatment Interventions:     PT Goals (current goals can now be found in the care plan section)    Visit Information  Last PT Received On: 12/17/12 Assistance Needed: +2 History of Present Illness: Pt. was admitted with swelling in his arms and legs and CKD 5, ?PNA.  Pt. also found to have UTI and is new start on HD this admission.    Subjective Data      Cognition  Cognition Arousal/Alertness: Awake/alert Behavior During Therapy: Flat affect Overall Cognitive Status: Within Functional Limits for tasks assessed    Balance     End of Session PT - End of Session Equipment Utilized During Treatment: Gait belt Activity Tolerance: Patient limited by fatigue Patient left: in chair;with call bell/phone within reach;with family/visitor present Nurse Communication: Need for lift equipment   GP     Renwick Asman,KATHrine E 12/17/2012, 1:01 PM Carmelia Bake, PT, DPT 12/17/2012 Pager: [phone]

## 2012-12-17 NOTE — Procedures (Signed)
I have seen and examined this patient and agree with the plan of care. Functioning AVF  BP 140/80  Goal 4L Ola Raap W 12/17/2012, 1:51 PM

## 2012-12-17 NOTE — Progress Notes (Signed)
Clinical Social Work Department BRIEF PSYCHOSOCIAL ASSESSMENT 12/17/2012  Patient:  JAYR, DECASTRO     Account Number:  [id number]     Admit date:  12/11/2012  Clinical Social Worker:  Daiva Huge  Date/Time:  12/16/2012 02:50 PM  Referred by:  Physician  Date Referred:  12/16/2012 Referred for  SNF Placement   Other Referral:   Interview type:  Other - See comment Other interview type:   Met with patient and his wife at bedside    PSYCHOSOCIAL DATA Living Status:  FAMILY Admitted from facility:   Level of care:   Primary support name:  Wife- Mickel Baas [phone] Primary support relationship to patient:  FAMILY Degree of support available:   good    CURRENT CONCERNS Current Concerns  Post-Acute Placement   Other Concerns:    SOCIAL WORK ASSESSMENT / PLAN Patient and wife are agreeable to SNF search for rehab at d/c. Wife works and patient is home alone while she is at work-   Assessment/plan status:  Other - See comment Other assessment/ plan:   FL[place] and [place] for SNF search   Information/referral to community resources:   SNF  EMS    PATIENT'S/FAMILY'S RESPONSE TO PLAN OF CARE: Patient and wife agree to SNF search- prefer [facility] as this is near their home.       Eduard Clos, MSW [phone]

## 2012-12-17 NOTE — Progress Notes (Signed)
Family Medicine Teaching Service Daily Progress Note Intern Pager: [phone]  Patient name: Gregory [name] Medical record number: [id number] Date of birth: [birth year] Age: 62 y.o. Gender: male  Primary Care Provider: No primary provider on file. Consultants: Nephrology Code Status: full code  Pt Overview and Major Events to Date:  8/9 - admitted with diffuse anasarca, no PCP 8/11 - s/p dialysis 8/13 - dialysis today. VSS said fistula OK to use for HD 8/15 - dialysis today while in recliner  Assessment and Plan: Gregory [name] is a 62 y.o. year old male presenting with no acute complaints, but poorly compliant as outpatient with ESRD. Diffuse anasarca. Currently on dialysis M, W, F  # ESRD - currently on dialysis  - no previous lab work in our system for baseline but most likely at baseline levels of BUN/Cr. Current BUN/Cr: 84/7.85 (8/14). Weight: 236lbs from 243 while on new bed (252lbs on admission). Net I/O: +224mL. UOP (Last 24 hours): 218ml. Net I/O since admission: 3.2L - Continue Prednisone 5 mg - metabolic anion gap acidosis, likely secondary to uremia, gap decreased to 14 with a normal bicarb of 22 [ ]  f/u urine output [ ]  strict I/O, daily weights [ ]  f/u nephrology recs  # Thrombocytopenia - Platelets decreased to 59k yesterday. Heparin stopped and SCDs started. 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 He  is enjoying increased mobility. Has is scheduled for dialysis today. He has decided to go to SNF after discharge and have chosen [facility].  Objective: Temp:  [97.4 F (36.3 C)-98.2 F (36.8 C)] 97.5 F (36.4 C) (08/15 0556) Pulse Rate:  [51-80] 51 (08/15 0556) Resp:  [18-20] 18 (08/15 0556) BP: (113-157)/(66-85) 113/68 mmHg (08/15 0556) SpO2:  [95 %-100 %] 100 % (08/15 0556) Weight:  [236 lb 1.8 oz (107.1 kg)] 236 lb 1.8 oz (107.1 kg) (08/14 2010)  Physical Exam: General: NAD, sitting in bed, wife in the room with him Cardiovascular: RRR, no murmurs Respiratory: CTAB, NWOB Abdomen: soft, nontender to palpation, +BS Extremities: 3+ edema in arms bilaterally with decreased skin tightness  4+ edema legs bilaterally.  calves nontender to palpation, fistula in right arm with bruit and palpable thrill. Overall exam is improving  Laboratory:  Recent Labs Lab 12/13/12 0814 12/15/12 0710 12/16/12 0450  WBC 3.6* 4.1 3.8*  HGB 10.7* 9.9* 8.9*  HCT 31.5* 29.7* 27.0*  PLT 120* 104* 59*    Recent Labs Lab 12/11/12 1318  12/14/12 0527 12/15/12 1440 12/16/12 0450  NA 141  < > 142 140 140  K 4.9  < > 5.1 3.9 4.0  CL 106  < > 106 106 104  CO2 14*  < > 21 20 22   BUN 108*  < > 96* 94* 84*  CREATININE 9.71*  < > 8.66* 8.67* 7.85*  CALCIUM 7.9*  < > 8.3* 8.1* 7.9*  PROT 5.2*  --   --   --   --  BILITOT 0.6  --   --   --   --   ALKPHOS 102  --   --   --   --   ALT 17  --   --   --   --   AST 19  --   --   --   --   GLUCOSE 87  < > 102* 125* 96  < > = values in this interval not displayed.  Imaging/Diagnostic Tests: No new studies today  Cordelia Poche, MD 12/17/2012, 7:28 AM PGY-1, [place] Intern pager: [phone], text pages welcome

## 2012-12-17 NOTE — Progress Notes (Addendum)
Clinical Social Work Department CLINICAL SOCIAL WORK PLACEMENT NOTE 12/17/2012  Patient:  Gregory [name], Gregory [name]  Account Number:  [id number] Admit date:  12/11/2012  Clinical Social Worker:  Daiva Huge  Date/time:  12/17/2012 02:54 PM  Clinical Social Work is seeking post-discharge placement for this patient at the following level of care:   SKILLED NURSING   (*CSW will update this form in Epic as items are completed)   12/17/2012  Patient/family provided with [place] Department of Clinical Social Work's list of facilities offering this level of care within the geographic area requested by the patient (or if unable, by the patient's family).  12/17/2012  Patient/family informed of their freedom to choose among providers that offer the needed level of care, that participate in Medicare, Medicaid or managed care program needed by the patient, have an available bed and are willing to accept the patient.  12/17/2012  Patient/family informed of [facility]' ownership interest in [facility], as well as of the fact that they are under no obligation to receive care at this facility.  PASARR submitted to EDS on 12/17/2012 PASARR number received from EDS on 12/17/2012  FL2 transmitted to all facilities in geographic area requested by pt/family on  12/17/2012 FL2 transmitted to all facilities within larger geographic area on   Patient informed that his/her managed care company has contracts with or will negotiate with  certain facilities, including the following:     Patient/family informed of bed offers received:  12/20/12 Patient chooses bed at [facility] Physician recommends and patient chooses bed at    Patient to be transferred to [place] on   Patient to be transferred to facility by [facility]  The following physician request were entered in Epic:   Additional Comments: Eduard Clos, MSW [phone]

## 2012-12-18 LAB — RENAL FUNCTION PANEL
BUN: 51 mg/dL — ABNORMAL HIGH (ref 6–23)
Calcium: 7.6 mg/dL — ABNORMAL LOW (ref 8.4–10.5)
GFR calc Af Amer: 12 mL/min — ABNORMAL LOW (ref >90)
Glucose, Bld: 87 mg/dL (ref 70–99)
Phosphorus: 5.2 mg/dL — ABNORMAL HIGH (ref 2.3–4.6)
Sodium: 139 mEq/L (ref 135–145)

## 2012-12-18 MED ORDER — DOCUSATE SODIUM 100 MG PO CAPS
100.0000 mg | ORAL_CAPSULE | Freq: Two times a day (BID) | ORAL | Status: DC
  Administered 2012-12-19: 100 mg via ORAL
  Filled 2012-12-18 (×4): qty 1

## 2012-12-18 MED ORDER — POLYETHYLENE GLYCOL 3350 17 G PO PACK
17.0000 g | PACK | Freq: Every day | ORAL | Status: DC
  Administered 2012-12-18 – 2012-12-19 (×2): 17 g via ORAL
  Filled 2012-12-18 (×3): qty 1

## 2012-12-18 NOTE — Progress Notes (Signed)
IN TO DO ROUTINE PIV , PT'S LEFT ARM NOTED WITH SWELLING JUST ABOVE AND BELOW LEFT AC AND MULTIPLE AREAS OF BRUISING, NO SITE FOUND FOR NEW SITE, CURRENT SITE REDRESSED AND FLUSHED, SITE WNL'S, STAFF RN MADE AWARE

## 2012-12-18 NOTE — Progress Notes (Signed)
[facility] ROUNDING NOTE   Subjective:   Interval History: appears comfortable eating breakfast  Objective:  Vital signs in last 24 hours:  Temp:  [97.4 F (36.3 C)-97.9 F (36.6 C)] 97.5 F (36.4 C) (08/16 0606) Pulse Rate:  [52-115] 55 (08/16 0606) Resp:  [16-18] 18 (08/16 0606) BP: (123-197)/(68-84) 123/74 mmHg (08/16 0606) SpO2:  [98 %-100 %] 100 % (08/16 0606) Weight:  [104.3 kg (229 lb 15 oz)] 104.3 kg (229 lb 15 oz) (08/15 2052)  Weight change: -2.8 kg (-6 lb 2.8 oz) Filed Weights   12/15/12 2136 12/16/12 2010 12/17/12 2052  Weight: 110.366 kg (243 lb 5 oz) 107.1 kg (236 lb 1.8 oz) 104.3 kg (229 lb 15 oz)    Intake/Output: I/O last 3 completed shifts: In: 353 [P.O.:240; I.V.:3; IV Piggyback:110] Out: 175 [Urine:175]   Intake/Output this shift:     CVS- RRR RS- CTA ABD- BS present soft non-distended EXT- 2 + edema   Basic Metabolic Panel:  Recent Labs Lab 12/14/12 0527 12/15/12 1440 12/16/12 0450 12/17/12 0906 12/18/12 0500  NA 142 140 140 141 139  K 5.1 3.9 4.0 3.9 4.2  CL 106 106 104 105 104  CO2 21 20 22 21 25   GLUCOSE 102* 125* 96 110* 87  BUN 96* 94* 84* 86* 51*  CREATININE 8.66* 8.67* 7.85* 7.72* 5.23*  CALCIUM 8.3* 8.1* 7.9* 7.9* 7.6*  PHOS 7.8* 7.8* 7.2* 7.1* 5.2*    Liver Function Tests:  Recent Labs Lab 12/11/12 1318  12/14/12 0527 12/15/12 1440 12/16/12 0450 12/17/12 0906 12/18/12 0500  AST 19  --   --   --   --   --   --   ALT 17  --   --   --   --   --   --   ALKPHOS 102  --   --   --   --   --   --   BILITOT 0.6  --   --   --   --   --   --   PROT 5.2*  --   --   --   --   --   --   ALBUMIN 2.8*  < > 2.6* 2.4* 2.3* 2.5* 2.4*  < > = values in this interval not displayed. No results found for this basename: LIPASE, AMYLASE,  in the last 168 hours No results found for this basename: AMMONIA,  in the last 168 hours  CBC:  Recent Labs Lab 12/12/12 0757 12/13/12 0814 12/15/12 0710 12/16/12 0450  12/17/12 0906  WBC 3.1* 3.6* 4.1 3.8* 4.4  HGB 12.5* 10.7* 9.9* 8.9* 9.3*  HCT 36.9* 31.5* 29.7* 27.0* 27.5*  MCV 87.9 86.3 88.1 88.2 89.3  PLT 109* 120* 104* 59* 73*    Cardiac Enzymes: No results found for this basename: CKTOTAL, CKMB, CKMBINDEX, TROPONINI,  in the last 168 hours  BNP: No components found with this basename: POCBNP,   CBG:  Recent Labs Lab 12/13/12 1204 12/16/12 0818  GLUCAP 89 86    Microbiology: Results for orders placed during the hospital encounter of 12/11/12  URINE CULTURE     Status: None   Collection Time    12/11/12  1:38 PM      Result Value Range Status   Specimen Description URINE, CLEAN CATCH   Final   Special Requests NONE   Final   Culture  Setup Time     Final   Value: 12/12/2012 02:33  Performed at [facility]     Final   Value: >=100,000 COLONIES/ML     Performed at [facility]   Culture     Final   Value: CITROBACTER KOSERI     Note: Two isolates with different morphologies were identified as the same organism.The most resistant organism was reported.     Performed at [facility]   Report Status 12/15/2012 FINAL   Final   Organism ID, Bacteria CITROBACTER KOSERI   Final    Coagulation Studies: No results found for this basename: LABPROT, INR,  in the last 72 hours  Urinalysis: No results found for this basename: COLORURINE, APPERANCEUR, LABSPEC, PHURINE, GLUCOSEU, HGBUR, BILIRUBINUR, KETONESUR, PROTEINUR, UROBILINOGEN, NITRITE, LEUKOCYTESUR,  in the last 72 hours    Imaging: No results found.   Medications:     . atenolol  150 mg Oral Daily  . calcium acetate  1,334 mg Oral TID WC  . darbepoetin (ARANESP) injection - DIALYSIS  60 mcg Intravenous Q Fri-HD  . docusate sodium  100 mg Oral BID  . doxercalciferol  4 mcg Intravenous Q M,W,F-HD  . ferric gluconate (FERRLECIT/NULECIT) IV  125 mg Intravenous Weekly  . multivitamin  1 tablet Oral QHS  . polyethylene glycol  17 g  Oral Daily  . predniSONE  5 mg Oral Daily  . sodium chloride  3 mL Intravenous Q12H   sodium chloride, oxyCODONE-acetaminophen, sodium chloride  Assessment/ Plan:  1.ESRD- new start plan dialysis [facility], last dialysis 8/15  2. Anemia  ESA and Iron 3. MBD . Calcium amd phosphorus controlled IV Vit D 4.HTN/VOL- still edematous remove fluid and challenge weight  5.ACCESS- AVF placed and functioning well    SNF placement    LOS: 7 Shaunae Sieloff W @TODAY @12 :05 PM

## 2012-12-18 NOTE — Progress Notes (Signed)
Family Medicine Teaching Service Daily Progress Note Intern Pager: [phone]  Patient name: Gregory [name] Medical record number: [id number] Date of birth: 1950/09/10 Age: 62 y.o. Gender: male  Primary Care Provider: No primary provider on file. Consultants: Nephrology Code Status: full code  Pt Overview and Major Events to Date:  8/9 - admitted with diffuse anasarca, no PCP 8/11 - s/p dialysis 8/13 - dialysis today. VSS said fistula OK to use for HD 8/15 - dialysis today while in recliner  Assessment and Plan: Gregory [name] is a 62 y.o. year old male presenting with no acute complaints, but poorly compliant as outpatient with ESRD. Diffuse anasarca. Currently on dialysis M, W, F  # ESRD - currently on dialysis, in the process of being set up for outpt therapy to continue after discharge - no previous lab work in our system for baseline; BUN/Cr improving with HD -Weight: 229 bs 8/16 (252lbs on admission, though different scales being used).  -Net I/O: -3291 as of 8/16 - Continue home chronic prednisone 5 mg - metabolic anion gap acidosis note previously; as of 8/16, gap 10; previously elevated likely secondary to uremia [ ]  f/u urine output [ ]  strict I/O, daily weights [ ]  f/u further nephrology recs; appreciate assistance  # Thrombocytopenia - Platelets decreased to 59k 8/14. 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 vs decreased erythropoietin from ESRD - improving 8/15 to 73k [ ]  f/u CBC prn [ ]  f/u HIT panel, sill pending  # Hyperkalemia: Resolved with HD [ ]  f/u daily renal function panel   # HTN: BPs have been stable in 110-140s/70-80s, has had bradycardia down to ~50bpm - continue home atenolol  # Report of skin breakdown on buttock - not visualized 8/16 - stage 3 pressure ulcer with stage 2 epithelial buds - air mattress and chair pressure redistribution pad  # Back pain: continue increased percocet to q4 PRN administration  FEN/GI: renal diet, saline  lock IV  Prophylaxis: SCD  Disposition: pending dialysis in a recliner and SNF placement  Subjective: Pt seen at bedside. States he feels well, today. No current complaints. Tolerated sitting up for dialysis well 8/15. Is still in the process of being set up for outpt dialysis. Feels nearing ready to go to SNF; hopeful for [facility]. States he feels the swelling in his legs and arms is much better than before, though "still pretty bad," and states that while he tolerates dialysis, it "takes a lot out of him."  Objective: Temp:  [97.4 F (36.3 C)-97.6 F (36.4 C)] 97.5 F (36.4 C) (08/16 0606) Pulse Rate:  [55-115] 55 (08/16 0606) Resp:  [16-18] 18 (08/16 0606) BP: (123-197)/(74-84) 123/74 mmHg (08/16 0606) SpO2:  [100 %] 100 % (08/16 0606) Weight:  [229 lb 15 oz (104.3 kg)] 229 lb 15 oz (104.3 kg) (08/15 2052)  Physical Exam: General: adult male in NAD, sitting in bed, very pleasant Cardiovascular: RRR, no murmurs Respiratory: CTAB, normal WOB, speaks in full sentences Abdomen: soft, nontender to palpation, +BS Extremities: 3+ edema in arms bilaterally, 3-4+ edema legs bilaterally ("much improved" per pt)  calves nontender to palpation  Very prominent, soft, nontender fistula in right arm with bruit and palpable thrill  Laboratory:  Recent Labs Lab 12/15/12 0710 12/16/12 0450 12/17/12 0906  WBC 4.1 3.8* 4.4  HGB 9.9* 8.9* 9.3*  HCT 29.7* 27.0* 27.5*  PLT 104* 59* 73*    Recent Labs Lab 12/16/12 0450 12/17/12 0906 12/18/12 0500  NA 140 141 139  K 4.0 3.9 4.2  CL 104 105 104  CO2 22 21 25   BUN 84* 86* 51*  CREATININE 7.85* 7.72* 5.23*  CALCIUM 7.9* 7.9* 7.6*  GLUCOSE 96 110* 87    Imaging/Diagnostic Tests: No new studies today  Emmaline Kluver, MD 12/18/2012, 3:18 PM PGY-2, [place] Intern pager: [phone], text pages welcome

## 2012-12-19 LAB — RENAL FUNCTION PANEL
BUN: 57 mg/dL — ABNORMAL HIGH (ref 6–23)
CO2: 25 mEq/L (ref 19–32)
Calcium: 7.8 mg/dL — ABNORMAL LOW (ref 8.4–10.5)
Creatinine, Ser: 5.73 mg/dL — ABNORMAL HIGH (ref 0.50–1.35)
Glucose, Bld: 92 mg/dL (ref 70–99)

## 2012-12-19 LAB — CBC
HCT: 25.5 % — ABNORMAL LOW (ref 39.0–52.0)
Hemoglobin: 8.2 g/dL — ABNORMAL LOW (ref 13.0–17.0)
MCH: 29.4 pg (ref 26.0–34.0)
MCHC: 32.2 g/dL (ref 30.0–36.0)
MCV: 91.4 fL (ref 78.0–100.0)

## 2012-12-19 NOTE — Progress Notes (Signed)
Family Medicine Teaching Service Daily Progress Note Intern Pager: [phone]  Patient name: Gregory [name] Medical record number: [id number] Date of birth: 1951-04-01 Age: 62 y.o. Gender: male  Primary Care Provider: No primary provider on file. Consultants: Nephrology Code Status: full code  Pt Overview and Major Events to Date:  8/9 - admitted with diffuse anasarca, no PCP 8/11 - s/p dialysis 8/13 - dialysis today. VSS said fistula OK to use for HD 8/15 - dialysis today while in recliner, tolerated well  Assessment and Plan: Gregory [name] is a 62 y.o. year old male presenting with no acute complaints, but poorly compliant as outpatient with ESRD. Diffuse anasarca, improving now on HD currently M, W, F.  # ESRD - currently on dialysis, in the process of being set up for outpt therapy to continue after discharge - no previous lab work in our system for baseline; BUN/Cr improving with HD -Weight: 231 as of 8/17 (252lbs on admission, though different scales being used).  -Net I/O: -3291 as of 8/16, not recorded overnight 8/16-8/17 - Continue home chronic prednisone 5 mg - metabolic anion gap acidosis note previously; as of 8/17, gap 10; previously elevated likely secondary to uremia [ ]  f/u urine output [ ]  strict I/O, daily weights [ ]  f/u further nephrology recs; appreciate assistance  # Thrombocytopenia - Platelets decreased to 59k 8/14. 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 vs decreased erythropoietin from ESRD - improving 8/15 to 73k, stable 8/17 at 74k [ ]  f/u HIT panel, sill pending  # Hyperkalemia: Resolved with HD [ ]  f/u daily renal function panel   # HTN: BPs have been stable in 110-140s/70-80s, has had bradycardia down to ~50bpm - continue home atenolol  # Report of skin breakdown on buttock - not visualized 8/17 - stage 3 pressure ulcer with stage 2 epithelial buds - air mattress and chair pressure redistribution pad, local wound care  # Back pain:  continue increased percocet to q4 PRN administration  FEN/GI: renal diet, saline lock IV  Prophylaxis: SCD  Disposition: management as above; discharge pending outpt HD arrangements and SNF placement, hopefully 8/18-20  Subjective: Pt seen at bedside. States he feels well, today. No current complaints. Slept well last night and had a BM yesterday evening. Eager to go to SNF soon to get out of the hospital. No abdominal pain, leg swelling "doing okay," and no pain/swelling around fistula.  Objective: Temp:  [97.5 F (36.4 C)-97.9 F (36.6 C)] 97.9 F (36.6 C) (08/17 0516) Pulse Rate:  [56-62] 56 (08/17 0516) Resp:  [18] 18 (08/17 0516) BP: (126-132)/(70-78) 132/76 mmHg (08/17 0516) SpO2:  [100 %] 100 % (08/17 0516) Weight:  [231 lb 4.2 oz (104.9 kg)] 231 lb 4.2 oz (104.9 kg) (08/16 2253)  Physical Exam: General: adult male in NAD, sitting in bed, very pleasant; initially sleeping, easily rousable Cardiovascular: RRR, no murmurs Respiratory: CTAB, normal WOB, speaks in full sentences Abdomen: soft, nontender to palpation, +BS Extremities: 3+ edema in arms bilaterally, 3-4+ edema legs bilaterally ("much improved" per pt)  calves nontender to palpation appearance generally unchanged  Very prominent, soft, nontender fistula in right arm with bruit and palpable thrill  Laboratory:  Recent Labs Lab 12/16/12 0450 12/17/12 0906 12/19/12 0442  WBC 3.8* 4.4 6.0  HGB 8.9* 9.3* 8.2*  HCT 27.0* 27.5* 25.5*  PLT 59* 73* 74*    Recent Labs Lab 12/17/12 0906 12/18/12 0500 12/19/12 0442  NA 141 139 139  K 3.9 4.2 4.7  CL 105 104 104  CO2 21 25 25   BUN 86* 51* 57*  CREATININE 7.72* 5.23* 5.73*  CALCIUM 7.9* 7.6* 7.8*  GLUCOSE 110* 87 92    Imaging/Diagnostic Tests: No new studies today  Emmaline Kluver, MD 12/19/2012, 9:35 AM PGY-2, [place] Intern pager: [phone], text pages welcome

## 2012-12-19 NOTE — Progress Notes (Signed)
I have seen and examined this patient. I have discussed with Dr Street.  I agree with their findings and plans as documented in their progress note.    

## 2012-12-19 NOTE — Progress Notes (Signed)
Weekend CSW met with patient and patient's relative to give bed offers. [facility] is first choice. Weekday CSW to follow for d/c planning.  Tilden Fossa, [facility]. [phone]

## 2012-12-19 NOTE — Progress Notes (Signed)
[facility] ROUNDING NOTE   Subjective:   Interval History: comfortable lying in bed no complaints  Objective:  Vital signs in last 24 hours:  Temp:  [97.5 F (36.4 C)-97.9 F (36.6 C)] 97.9 F (36.6 C) (08/17 0516) Pulse Rate:  [56-62] 56 (08/17 0516) Resp:  [18] 18 (08/17 0516) BP: (126-132)/(70-78) 132/76 mmHg (08/17 0516) SpO2:  [100 %] 100 % (08/17 0516) Weight:  [104.9 kg (231 lb 4.2 oz)] 104.9 kg (231 lb 4.2 oz) (08/16 2253)  Weight change: 0.6 kg (1 lb 5.2 oz) Filed Weights   12/16/12 2010 12/17/12 2052 12/18/12 2253  Weight: 107.1 kg (236 lb 1.8 oz) 104.3 kg (229 lb 15 oz) 104.9 kg (231 lb 4.2 oz)    Intake/Output: I/O last 3 completed shifts: In: 110 [IV Piggyback:110] Out: 100 [Urine:100]   Intake/Output this shift:     CVS- RRR RS- CTA ABD- BS present soft non-distended EXT- 3+ edema hard lichenified skin changes   Basic Metabolic Panel:  Recent Labs Lab 12/15/12 1440 12/16/12 0450 12/17/12 0906 12/18/12 0500 12/19/12 0442  NA 140 140 141 139 139  K 3.9 4.0 3.9 4.2 4.7  CL 106 104 105 104 104  CO2 20 22 21 25 25   GLUCOSE 125* 96 110* 87 92  BUN 94* 84* 86* 51* 57*  CREATININE 8.67* 7.85* 7.72* 5.23* 5.73*  CALCIUM 8.1* 7.9* 7.9* 7.6* 7.8*  PHOS 7.8* 7.2* 7.1* 5.2* 5.5*    Liver Function Tests:  Recent Labs Lab 12/15/12 1440 12/16/12 0450 12/17/12 0906 12/18/12 0500 12/19/12 0442  ALBUMIN 2.4* 2.3* 2.5* 2.4* 2.3*   No results found for this basename: LIPASE, AMYLASE,  in the last 168 hours No results found for this basename: AMMONIA,  in the last 168 hours  CBC:  Recent Labs Lab 12/13/12 0814 12/15/12 0710 12/16/12 0450 12/17/12 0906 12/19/12 0442  WBC 3.6* 4.1 3.8* 4.4 6.0  HGB 10.7* 9.9* 8.9* 9.3* 8.2*  HCT 31.5* 29.7* 27.0* 27.5* 25.5*  MCV 86.3 88.1 88.2 89.3 91.4  PLT 120* 104* 59* 73* 74*    Cardiac Enzymes: No results found for this basename: CKTOTAL, CKMB, CKMBINDEX, TROPONINI,  in the last 168  hours  BNP: No components found with this basename: POCBNP,   CBG:  Recent Labs Lab 12/13/12 1204 12/16/12 0818  GLUCAP 89 86    Microbiology: Results for orders placed during the hospital encounter of 12/11/12  URINE CULTURE     Status: None   Collection Time    12/11/12  1:38 PM      Result Value Range Status   Specimen Description URINE, CLEAN CATCH   Final   Special Requests NONE   Final   Culture  Setup Time     Final   Value: 12/12/2012 02:33     Performed at [place]     Final   Value: >=100,000 COLONIES/ML     Performed at [facility]   Culture     Final   Value: CITROBACTER KOSERI     Note: Two isolates with different morphologies were identified as the same organism.The most resistant organism was reported.     Performed at [facility]   Report Status 12/15/2012 FINAL   Final   Organism ID, Bacteria CITROBACTER KOSERI   Final    Coagulation Studies: No results found for this basename: LABPROT, INR,  in the last 72 hours  Urinalysis: No results found for this basename: COLORURINE,  APPERANCEUR, LABSPEC, PHURINE, GLUCOSEU, HGBUR, BILIRUBINUR, KETONESUR, PROTEINUR, UROBILINOGEN, NITRITE, LEUKOCYTESUR,  in the last 72 hours    Imaging: No results found.   Medications:     . atenolol  150 mg Oral Daily  . calcium acetate  1,334 mg Oral TID WC  . darbepoetin (ARANESP) injection - DIALYSIS  60 mcg Intravenous Q Fri-HD  . docusate sodium  100 mg Oral BID  . doxercalciferol  4 mcg Intravenous Q M,W,F-HD  . ferric gluconate (FERRLECIT/NULECIT) IV  125 mg Intravenous Weekly  . multivitamin  1 tablet Oral QHS  . polyethylene glycol  17 g Oral Daily  . predniSONE  5 mg Oral Daily  . sodium chloride  3 mL Intravenous Q12H   sodium chloride, oxyCODONE-acetaminophen, sodium chloride  Assessment/ Plan:  1.ESRD- new start plan dialysis [facility], last dialysis 8/15  2. Anemia ESA and Iron  3. MBD . Calcium  amd phosphorus controlled IV Vit D  4.HTN/VOL- still edematous remove fluid and challenge weight  5.ACCESS- AVF placed and functioning well   Will schedule dialysis in the morning, it appears that SNF arrangements are incomplete at this time.   LOS: 8 Amberleigh Gerken W @TODAY @12 :55 PM

## 2012-12-19 NOTE — Progress Notes (Signed)
I have seen and examined this patient. I have discussed with Dr Venetia Maxon.  I agree with their findings and plans as documented in their progress note. Awaiting SNF placement.

## 2012-12-20 DIAGNOSIS — G20 Parkinson's disease: Secondary | ICD-10-CM | POA: Diagnosis not present

## 2012-12-20 DIAGNOSIS — I509 Heart failure, unspecified: Secondary | ICD-10-CM | POA: Diagnosis not present

## 2012-12-20 DIAGNOSIS — E872 Acidosis: Secondary | ICD-10-CM | POA: Diagnosis not present

## 2012-12-20 DIAGNOSIS — M6281 Muscle weakness (generalized): Secondary | ICD-10-CM | POA: Diagnosis not present

## 2012-12-20 DIAGNOSIS — N390 Urinary tract infection, site not specified: Secondary | ICD-10-CM | POA: Diagnosis not present

## 2012-12-20 DIAGNOSIS — N186 End stage renal disease: Secondary | ICD-10-CM | POA: Diagnosis not present

## 2012-12-20 DIAGNOSIS — R609 Edema, unspecified: Secondary | ICD-10-CM | POA: Diagnosis not present

## 2012-12-20 DIAGNOSIS — G3184 Mild cognitive impairment, so stated: Secondary | ICD-10-CM | POA: Diagnosis not present

## 2012-12-20 DIAGNOSIS — N2581 Secondary hyperparathyroidism of renal origin: Secondary | ICD-10-CM | POA: Diagnosis not present

## 2012-12-20 DIAGNOSIS — I150 Renovascular hypertension: Secondary | ICD-10-CM | POA: Diagnosis not present

## 2012-12-20 DIAGNOSIS — D631 Anemia in chronic kidney disease: Secondary | ICD-10-CM | POA: Diagnosis not present

## 2012-12-20 DIAGNOSIS — I10 Essential (primary) hypertension: Secondary | ICD-10-CM | POA: Diagnosis not present

## 2012-12-20 DIAGNOSIS — I120 Hypertensive chronic kidney disease with stage 5 chronic kidney disease or end stage renal disease: Secondary | ICD-10-CM | POA: Diagnosis not present

## 2012-12-20 DIAGNOSIS — R6889 Other general symptoms and signs: Secondary | ICD-10-CM | POA: Diagnosis not present

## 2012-12-20 DIAGNOSIS — L89309 Pressure ulcer of unspecified buttock, unspecified stage: Secondary | ICD-10-CM | POA: Diagnosis not present

## 2012-12-20 DIAGNOSIS — R262 Difficulty in walking, not elsewhere classified: Secondary | ICD-10-CM | POA: Diagnosis not present

## 2012-12-20 DIAGNOSIS — N185 Chronic kidney disease, stage 5: Secondary | ICD-10-CM | POA: Diagnosis not present

## 2012-12-20 DIAGNOSIS — L89303 Pressure ulcer of unspecified buttock, stage 3: Secondary | ICD-10-CM | POA: Clinically undetermined

## 2012-12-20 DIAGNOSIS — N189 Chronic kidney disease, unspecified: Secondary | ICD-10-CM | POA: Diagnosis not present

## 2012-12-20 DIAGNOSIS — IMO0002 Reserved for concepts with insufficient information to code with codable children: Secondary | ICD-10-CM | POA: Diagnosis not present

## 2012-12-20 DIAGNOSIS — L8993 Pressure ulcer of unspecified site, stage 3: Secondary | ICD-10-CM | POA: Diagnosis not present

## 2012-12-20 DIAGNOSIS — D320 Benign neoplasm of cerebral meninges: Secondary | ICD-10-CM | POA: Diagnosis not present

## 2012-12-20 DIAGNOSIS — I428 Other cardiomyopathies: Secondary | ICD-10-CM | POA: Clinically undetermined

## 2012-12-20 DIAGNOSIS — I5043 Acute on chronic combined systolic (congestive) and diastolic (congestive) heart failure: Secondary | ICD-10-CM | POA: Diagnosis not present

## 2012-12-20 DIAGNOSIS — B351 Tinea unguium: Secondary | ICD-10-CM | POA: Diagnosis not present

## 2012-12-20 LAB — HEPARIN INDUCED THROMBOCYTOPENIA PNL
Patient O.D.: 0.053
UFH High Dose UFH H: 0 % Release
UFH Low Dose 0.1 IU/mL: 0 % Release
UFH Low Dose 0.5 IU/mL: 0 % Release
UFH SRA Result: NEGATIVE

## 2012-12-20 LAB — CBC
MCH: 30.2 pg (ref 26.0–34.0)
MCV: 89.4 fL (ref 78.0–100.0)
Platelets: 87 10*3/uL — ABNORMAL LOW (ref 150–400)
RDW: 17.5 % — ABNORMAL HIGH (ref 11.5–15.5)

## 2012-12-20 LAB — RENAL FUNCTION PANEL
Albumin: 2.3 g/dL — ABNORMAL LOW (ref 3.5–5.2)
BUN: 62 mg/dL — ABNORMAL HIGH (ref 6–23)
Calcium: 7.8 mg/dL — ABNORMAL LOW (ref 8.4–10.5)
Creatinine, Ser: 5.96 mg/dL — ABNORMAL HIGH (ref 0.50–1.35)
GFR calc non Af Amer: 9 mL/min — ABNORMAL LOW (ref >90)

## 2012-12-20 MED ORDER — SODIUM CHLORIDE 0.9 % IV SOLN: 100.0000 mL | INTRAVENOUS | Status: DC | PRN

## 2012-12-20 MED ORDER — NEPRO/CARBSTEADY PO LIQD
237.0000 mL | ORAL | Status: DC | PRN
  Filled 2012-12-20: qty 237

## 2012-12-20 MED ORDER — LIDOCAINE HCL (PF) 1 % IJ SOLN: 5.0000 mL | INTRAMUSCULAR | Status: DC | PRN

## 2012-12-20 MED ORDER — PENTAFLUOROPROP-TETRAFLUOROETH EX AERO: 1.0000 "application " | INHALATION_SPRAY | CUTANEOUS | Status: DC | PRN

## 2012-12-20 MED ORDER — OXYCODONE-ACETAMINOPHEN 7.5-325 MG PO TABS: 1.0000 | ORAL_TABLET | Freq: Two times a day (BID) | ORAL | Status: DC | PRN

## 2012-12-20 MED ORDER — NEPRO/CARBSTEADY PO LIQD: 237.0000 mL | ORAL | Status: DC | PRN

## 2012-12-20 MED ORDER — HEPARIN SODIUM (PORCINE) 1000 UNIT/ML DIALYSIS: 1000.0000 [IU] | INTRAMUSCULAR | Status: DC | PRN

## 2012-12-20 MED ORDER — ALTEPLASE 2 MG IJ SOLR: 2.0000 mg | Freq: Once | INTRAMUSCULAR | Status: DC | PRN

## 2012-12-20 MED ORDER — LIDOCAINE-PRILOCAINE 2.5-2.5 % EX CREA: 1.0000 "application " | TOPICAL_CREAM | CUTANEOUS | Status: DC | PRN

## 2012-12-20 MED ORDER — HEPARIN SODIUM (PORCINE) 1000 UNIT/ML DIALYSIS: 100.0000 [IU]/kg | INTRAMUSCULAR | Status: DC | PRN

## 2012-12-20 MED ORDER — HEPARIN SODIUM (PORCINE) 1000 UNIT/ML DIALYSIS: 20.0000 [IU]/kg | INTRAMUSCULAR | Status: DC | PRN

## 2012-12-20 MED ORDER — DOXERCALCIFEROL 4 MCG/2ML IV SOLN
INTRAVENOUS | Status: AC
  Administered 2012-12-20: 4 ug via INTRAVENOUS
  Filled 2012-12-20: qty 2

## 2012-12-20 NOTE — Procedures (Signed)
Patient was seen on dialysis and the procedure was supervised.  BFR 300  Via AVF BP is  133/72.   Patient appears to be tolerating treatment well  Lyrick Worland A 12/20/2012

## 2012-12-20 NOTE — Progress Notes (Signed)
Patient to [facility] via EMS in no acute distress.  Family at bedside.

## 2012-12-20 NOTE — Progress Notes (Addendum)
Clinical Education officer, museum (CSW) informed that pt is ready for dc today. Wife aware and agreeable to sign pt in today. CSW prepared and placed pt dc packet in shadow chart. [facility] contacted for transport for a 17:30, RN given facility contact to give report. No additional needs, CSW signing off. Hunt Oris, MSW, [place] [facility]

## 2012-12-20 NOTE — Progress Notes (Signed)
Subjective:   Feeling good, just hungry. Dialysis is going well so far. Feels LE edema is getting better, No complaints. Hoping to go to [facility] at discharge  Objective Filed Vitals:   12/20/12 0800 12/20/12 0830 12/20/12 0858 12/20/12 0925  BP: 131/68 139/75 133/72 132/67  Pulse: 51 51 53 54  Temp:      TempSrc:      Resp: 16 18 18 18   Height:      Weight:      SpO2:       Physical Exam General: Alert and oriented, cooperative. No acute distress. Seen during HD Heart: RRR no murmur Lungs: CTA bilat Abdomen: soft, nontender +BS x4 Extremities: +3 edema in arms and legs bilat Dialysis Access: R UA AVF - functioning well in HD now  Assessment/Plan: 1. Anasarca- HD initiated 8/11- volume status improving slowly- overall down 10 kg 2. ESRD - K+ 4.3. Currently on HD today. In the process of being set up for out pt HD- cont MWF via AVF.  Apparently accepted at [place] ?TTS? 3. Anemia - Hgb 8.0. Aranesp 60 mcg Q Friday HD. Nulecit 125 Q Friday HD 4. Secondary hyperparathyroidism -  Ca+ 7.8. Phos 5.5. PTH 681 Phoslo with meals. hectorol 4 mcg 5. HTN/volume - pre HD wt 104 kgs, BP 134/68. UF goal 3500 today. On atenolol 150 mg. Pre and post HD wts 6. Nutrition - Alb 2.3.  Renal diet. 1200 fluid restriction- encourage protein 7. Thrombocytopenia- plts 87- trending up. HIT panel was ordered on 8/14, result still pending, No heparin given in HD-   Discharge pending SNF placement  Shelle Iron, NP [place] [phone] 12/20/2012,9:53 AM  LOS: 9 days   Patient seen and examined, agree with above note with above modifications. 62 year old BM new start to HD- working to decrease volume status is the main thing- he is clinically improved, awaiting discharge planning to NH.  Next HD planned for Wednesday Corliss Parish, MD 12/20/2012      Additional Objective Labs: Basic Metabolic Panel:  Recent Labs Lab 12/18/12 0500 12/19/12 0442 12/20/12 0652  NA  139 139 139  K 4.2 4.7 4.3  CL 104 104 105  CO2 25 25 25   GLUCOSE 87 92 91  BUN 51* 57* 62*  CREATININE 5.23* 5.73* 5.96*  CALCIUM 7.6* 7.8* 7.8*  PHOS 5.2* 5.5* 5.5*   Liver Function Tests:  Recent Labs Lab 12/18/12 0500 12/19/12 0442 12/20/12 0652  ALBUMIN 2.4* 2.3* 2.3*   No results found for this basename: LIPASE, AMYLASE,  in the last 168 hours CBC:  Recent Labs Lab 12/15/12 0710 12/16/12 0450 12/17/12 0906 12/19/12 0442 12/20/12 0652  WBC 4.1 3.8* 4.4 6.0 5.7  HGB 9.9* 8.9* 9.3* 8.2* 8.0*  HCT 29.7* 27.0* 27.5* 25.5* 23.7*  MCV 88.1 88.2 89.3 91.4 89.4  PLT 104* 59* 73* 74* 87*   Blood Culture    Component Value Date/Time   SDES URINE, CLEAN CATCH 12/11/2012 1338   SPECREQUEST NONE 12/11/2012 1338   CULT  Value: CITROBACTER KOSERI Note: Two isolates with different morphologies were identified as the same organism.The most resistant organism was reported. Performed at [facility] 12/11/2012 1338   REPTSTATUS 12/15/2012 FINAL 12/11/2012 1338    Cardiac Enzymes: No results found for this basename: CKTOTAL, CKMB, CKMBINDEX, TROPONINI,  in the last 168 hours CBG:  Recent Labs Lab 12/13/12 1204 12/16/12 0818  GLUCAP 89 86   Iron Studies: No results found for this basename:  IRON, TIBC, TRANSFERRIN, FERRITIN,  in the last 72 hours @lablastinr3 @ Studies/Results: No results found. Medications:   . atenolol  150 mg Oral Daily  . calcium acetate  1,334 mg Oral TID WC  . darbepoetin (ARANESP) injection - DIALYSIS  60 mcg Intravenous Q Fri-HD  . docusate sodium  100 mg Oral BID  . doxercalciferol  4 mcg Intravenous Q M,W,F-HD  . ferric gluconate (FERRLECIT/NULECIT) IV  125 mg Intravenous Weekly  . multivitamin  1 tablet Oral QHS  . polyethylene glycol  17 g Oral Daily  . predniSONE  5 mg Oral Daily  . sodium chloride  3 mL Intravenous Q12H

## 2012-12-20 NOTE — Progress Notes (Signed)
Covering Clinical Education officer, museum (CSW) spoke with Ebony Hail at [facility] who informed CSW that they are not able to offer placement for pt. CSW has left a message for pt wife at work. CSW awaiting wife to select a new SNF for pt.   Hunt Oris, MSW, [place]

## 2012-12-20 NOTE — Discharge Summary (Signed)
FMTS Attending Admission Note: Orval Dortch,MD I  have seen and examined this patient, reviewed their chart. I have discussed this patient with the resident. I agree with the resident's findings, assessment and care plan.  

## 2012-12-20 NOTE — Progress Notes (Signed)
FMTS Attending Admission Note: Gregory Shawhan,MD I  have seen and examined this patient, reviewed their chart. I have discussed this patient with the resident. I agree with the resident's findings, assessment and care plan.  Patient evaluated s/p dialysis today,doing well,he had abdominal pain during dialysis which has now resolved,no other complaints. Pulm and cardiac exam benign, Abd: benign. He does have about 2++ pedal swelling, no tenderness. Continue dialysis as scheduled. Plan to d/c to SNF when bed is available.

## 2012-12-20 NOTE — Discharge Summary (Signed)
[facility] Discharge Summary  Patient name: Gregory [name] Medical record number: [id number] Date of birth: Mar 14, 1951 Age: 62 y.o. Gender: male Date of Admission: 12/11/2012  Date of Discharge: 12/20/2012 Admitting Physician: Zigmund Gottron, MD  Primary Care Provider: No primary provider on file. 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), with outpt HD to continue [facility])  Discharge Condition: stable  Discharge Exam: BP 129/58  Pulse 63  Temp(Src) 97.5 F (36.4 C) (Oral)  Resp 20  Ht 6\' 3"  (1.905 m)  Wt 221 lb 5.5 oz (100.4 kg)  BMI 27.67 kg/m2  SpO2 95% General: adult male in NAD, laying in bed receiving dialysis  Cardiovascular: RRR, no murmurs  Respiratory: CTAB, normal WOB, speaks in full sentences  Abdomen: soft, mild tenderness to palpation in LLQ and epigastrium, +BS  Extremities: 3+ edema in arms bilaterally, 3-4+ edema legs bilaterally   calves nontender to palpation appearance generally unchanged   Very prominent, soft, nontender fistula in right arm with bruit and palpable thrill  Brief Hospital Course: Gregory [name] is a 62 y.o. year old male who presented with diffuse anasarca, poorly compliant as outpatient with CKD stage V; not previously on HD, but now ESRD on HD started this admission. See below by problem list.  # ESRD - currently on dialysis, started this admission to continue after discharge - no previous lab work in our system for baseline; BUN/Cr improving with HD -Weight: 231 as of 8/17 (252lbs on admission,  though different scales being used).  - continued home chronic prednisone 5 mg  - metabolic anion gap acidosis noted early in admission, presumed due to uremia - as of 8/17, gap 10  # Citrobacter UTI - noted on urine culture 8/9, treated with azithromycin and ceftriaxone - not complaining of any urinary symptoms  # Thrombocytopenia - Platelets decreased to 59k 8/14. Heparin stopped and SCDs started  - Could be HIT vs decreased erythropoietin from ESRD  - improving 8/15 to 73k, stable 8/17 at 74k  - HIT panel sill pending at time of discharge  # Hyperkalemia: Resolved with HD   # HTN: BPs have been stable in 110-140s/70-80s, has had bradycardia down to ~50bpm  - continued home atenolol   # Report of skin breakdown on buttock - - stage 3 pressure ulcer with stage 2 epithelial buds  - air mattress and chair pressure redistribution pad, local wound care  - would benefit from continued wound care  # Back pain: managed with Percocet to q4 PRN  #Heart failure with reduced EF - EF 30% on echocardiogram [id number]; mixed systolic and diastolic dysfunction -could be contributing to overall fluid overload; pt has not been seen by cardiology previously  Issues for Follow Up: 1. ESRD on HD - ensure pt continues to follow up outpt for dialysis and with nephrology 2. Pt would benefit from continued PT/OT and wound care for buttock ulcer. 3. Thrombocytopenia - follow up HIT panel, pending at time of discharge 4. Consider cardiology follow-up for reduced EF noted on echocardiogram 5. Pt with Citrobacter noted on urine culture 8/9, treated with ceftriaxone/azithro (concern on admission for possible PNA); no complaints of dysuria. Consider rechecking  urine culture and/or treating empirically.  Significant Procedures:  2D Echo 8/10 Study Conclusions - Left ventricle: The cavity size was mildly dilated. Wall thickness was increased in a pattern of moderate LVH. Systolic function was moderately to  severely reduced. The estimated ejection fraction was in the range of 30% to 35%. Diffuse hypokinesis. Doppler parameters are consistent with abnormal left ventricular relaxation (grade 1 diastolic dysfunction). Doppler parameters are consistent with high ventricular filling pressure. - Ventricular septum: The contour showed diastolic flattening and systolic flattening. - Mitral valve: Calcified annulus. Mild regurgitation. - Left atrium: The atrium was mildly dilated. - Right ventricle: The cavity size was severely dilated. Systolic function was severely reduced. - Right atrium: The atrium was severely dilated. - Pulmonary arteries: PA peak pressure: 50mm Hg (S). - Pericardium, extracardiac: There was a left pleural effusion. - Impressions: There is biventricular dysfunction with RV dyfunction worse than LV.  Significant Labs and Imaging:   Recent Labs Lab 12/17/12 0906 12/19/12 0442 12/20/12 0652  WBC 4.4 6.0 5.7  HGB 9.3* 8.2* 8.0*  HCT 27.5* 25.5* 23.7*  PLT 73* 74* 87*    Recent Labs Lab 12/16/12 0450 12/17/12 0906 12/18/12 0500 12/19/12 0442 12/20/12 0652  NA 140 141 139 139 139  K 4.0 3.9 4.2 4.7 4.3  CL 104 105 104 104 105  CO2 22 21 25 25 25   GLUCOSE 96 110* 87 92 91  BUN 84* 86* 51* 57* 62*  CREATININE 7.85* 7.72* 5.23* 5.73* 5.96*  CALCIUM 7.9* 7.9* 7.6* 7.8* 7.8*  PHOS 7.2* 7.1* 5.2* 5.5* 5.5*  ALBUMIN 2.3* 2.5* 2.4* 2.3* 2.3*    12/12/2012 18:26  PTH 681.9 (H)  Hepatitis B Surface Ag NEGATIVE  Hep B S Ab POSITIVE (A)  Hep B Core Total Ab POSITIVE (A)   Pro-BNP on admission 8/9: >70,000  CXR 8/9: Cardiomegaly, bilateral small effusions, bilateral basilar atelectasis EKG 8/9 - NSR, rate 63, QTc 473, grossly unremarkable  Micro: urine culture 8/9 shows Citrobacter kroseri (pansensitive)  Outstanding Results: heparin-induced thrombocytopenia lab panel  Discharge Medications:    Medication List         atenolol 100 MG tablet  Commonly  known as:  TENORMIN  Take 150 mg by mouth daily.     calcitRIOL 0.25 MCG capsule  Commonly known as:  ROCALTROL  Take 1 mcg by mouth 2 (two) times daily.     oxyCODONE-acetaminophen 7.5-325 MG per tablet  Commonly known as:  PERCOCET  Take 1 tablet by mouth 2 (two) times daily as needed for pain.     predniSONE 5 MG tablet  Commonly known as:  DELTASONE  Take 5 mg by mouth daily.        Discharge Instructions: Please refer to Patient Instructions section of EMR for full details.  Patient was counseled important signs and symptoms that should prompt return to medical care, changes in medications, dietary instructions, activity restrictions, and follow up appointments.   Follow-Up Appointments: Follow-up Information   Follow up with Lucrezia Starch, MD.   Specialty:  Nephrology   Contact information:   [place] [address] [place], MD 12/20/2012, 4:43 PM PGY-2, [place]

## 2012-12-20 NOTE — Progress Notes (Signed)
Family Medicine Teaching Service Daily Progress Note Intern Pager: [phone]  Patient name: Gregory [name] Medical record number: [id number] Date of birth: 1950/08/30 Age: 62 y.o. Gender: male  Primary Care Provider: No primary provider on file. Consultants: Nephrology Code Status: full code  Pt Overview and Major Events to Date:  8/9 - admitted with diffuse anasarca, no PCP 8/11 - s/p dialysis 8/13 - dialysis today. VSS said fistula OK to use for HD 8/15 - dialysis today while in recliner, tolerated well 8/18 - dialysis today, pending SNF  Assessment and Plan: Gregory [name] is a 62 y.o. year old male presenting with no acute complaints, but poorly compliant as outpatient with ESRD. Diffuse anasarca, improving now on HD currently M, W, F.  # ESRD - currently on dialysis, in the process of being set up for outpt therapy to continue after discharge - no previous lab work in our system for baseline; BUN/Cr improving with HD -Weight: 231 as of 8/17 (252lbs on admission, though different scales being used).  -Net I/O: -3291 as of 8/16, not recorded overnight 8/16-8/17 - Continue home chronic prednisone 5 mg - metabolic anion gap acidosis note previously; as of 8/17, gap 10; previously elevated likely secondary to uremia [ ]  f/u urine output [ ]  strict I/O, daily weights [ ]  f/u further nephrology recs; appreciate assistance  # Thrombocytopenia - Platelets decreased to 59k 8/14. 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 vs decreased erythropoietin from ESRD - improving 8/15 to 73k, stable 8/17 at 74k [ ]  f/u HIT panel, sill pending  # Hyperkalemia: Resolved with HD [ ]  f/u daily renal function panel   # HTN: BPs have been stable in 110-140s/70-80s, has had bradycardia down to ~50bpm - continue home atenolol  # Report of skin breakdown on buttock - not visualized 8/17 - stage 3 pressure ulcer with stage 2 epithelial buds - air mattress and chair pressure redistribution pad,  local wound care  # Back pain: continue increased percocet to q4 PRN administration  FEN/GI: renal diet, saline lock IV  Prophylaxis: SCD  Disposition: management as above; discharge pending outpt HD arrangements and SNF placement, hopefully 8/18-20  Subjective:  Patient seen in dialysis unit. Only complaint is this morning around the time dialysis started noticed a mild abdominal pain that he points as across the left and right lower quadrants. Says he didn't have breakfast, thinks maybe that is what caused it. Had a BM yesterday. No pains in leg, leg swelling is the same as it has been.   Objective: Temp:  [97.4 F (36.3 C)-98.2 F (36.8 C)] 97.9 F (36.6 C) (08/18 0652) Pulse Rate:  [52-60] 60 (08/18 0652) Resp:  [18-20] 18 (08/18 0652) BP: (125-138)/(66-74) 129/69 mmHg (08/18 0652) SpO2:  [98 %-100 %] 98 % (08/18 0652) Weight:  [229 lb 4.5 oz (104 kg)] 229 lb 4.5 oz (104 kg) (08/18 [id number])  Physical Exam: General: adult male in NAD, laying in bed receiving dialysis Cardiovascular: RRR, no murmurs Respiratory: CTAB, normal WOB, speaks in full sentences Abdomen: soft, mild tenderness to palpation in LLQ and epigastrium, +BS Extremities: 3+ edema in arms bilaterally, 3-4+ edema legs bilaterally  calves nontender to palpation appearance generally unchanged  Very prominent, soft, nontender fistula in right arm with bruit and palpable thrill  Laboratory:  Recent Labs Lab 12/16/12 0450 12/17/12 0906 12/19/12 0442  WBC 3.8* 4.4 6.0  HGB 8.9* 9.3* 8.2*  HCT 27.0* 27.5* 25.5*  PLT 59* 73* 74*  Recent Labs Lab 12/17/12 0906 12/18/12 0500 12/19/12 0442  NA 141 139 139  K 3.9 4.2 4.7  CL 105 104 104  CO2 21 25 25   BUN 86* 51* 57*  CREATININE 7.72* 5.23* 5.73*  CALCIUM 7.9* 7.6* 7.8*  GLUCOSE 110* 87 92    Imaging/Diagnostic Tests: No new studies today  Tawanna Sat, MD 12/20/2012, 7:09 AM PGY-1, [place] Intern pager: [phone], text  pages welcome

## 2012-12-20 NOTE — Progress Notes (Signed)
Pt and wife have chosen placement at [facility]. Facility able to accept pt and provide transport to dialysis. CSW to assist with dc.  Hunt Oris, MSW, [place]

## 2012-12-20 NOTE — Progress Notes (Signed)
Physical Therapy Treatment Patient Details Name: Gregory [name] MRN: [id number] DOB: Jun 23, 1950 Today's Date: 12/20/2012 Time: [id number] PT Time Calculation (min): 26 min  PT Assessment / Plan / Recommendation  History of Present Illness Pt. was admitted with swelling in his arms and legs and CKD 5, ?PNA.  Pt. also found to have UTI and is new start on HD this admission.   PT Comments   Pt progressing very well and able to ambulate in hallway min/guard with a few standing rest breaks.  Follow Up Recommendations  Supervision/Assistance - 24 hour;SNF     Does the patient have the potential to tolerate intense rehabilitation     Barriers to Discharge        Equipment Recommendations  Rolling walker with 5" wheels;Wheelchair (measurements PT);Wheelchair cushion (measurements PT)    Recommendations for Other Services    Frequency     Progress towards PT Goals Progress towards PT goals: Progressing toward goals  Plan Current plan remains appropriate    Precautions / Restrictions Precautions Precautions: Fall Restrictions Weight Bearing Restrictions: No   Pertinent Vitals/Pain R UE pain, decreased use of R UE and repositioned to comfort    Mobility  Bed Mobility Bed Mobility: Supine to Sit;Sit to Supine;Scooting to HOB Supine to Sit: 5: Supervision;With rails;HOB elevated Sit to Supine: 3: Mod assist;HOB flat;With rail Scooting to [facility]: 5: Supervision Details for Bed Mobility Assistance: assist for LEs onto bed Transfers Transfers: Sit to Stand;Stand to Sit Sit to Stand: 4: Min guard;With upper extremity assist;From bed Stand to Sit: 4: Min guard;With upper extremity assist;To bed Details for Transfer Assistance: verbal cues for hand placement Ambulation/Gait Ambulation/Gait Assistance: 4: Min guard Ambulation Distance (Feet): 160 Feet Assistive device: Rolling walker Ambulation/Gait Assistance Details: verbal cues for posture, a few standing rest breaks required for SOB  and fatigue Gait Pattern: Step-through pattern;Decreased stride length;Trunk flexed;Wide base of support Gait velocity: decreased    Exercises General Exercises - Lower Extremity Ankle Circles/Pumps: AROM;Both;Seated;20 reps Quad Sets: AROM;Both;15 reps;Supine Gluteal Sets: AROM;Both;15 reps;Supine Long Arc Quad: AROM;Both;15 reps;Seated Hip ABduction/ADduction: AROM;Both;15 reps;Supine Hip Flexion/Marching: 15 reps;Both;AROM;Seated;Limitations Hip Flexion/Marching Limitations: limited AROM esp on L   PT Diagnosis:    PT Problem List:   PT Treatment Interventions:     PT Goals (current goals can now be found in the care plan section)    Visit Information  Last PT Received On: 12/20/12 Assistance Needed: +1 History of Present Illness: Pt. was admitted with swelling in his arms and legs and CKD 5, ?PNA.  Pt. also found to have UTI and is new start on HD this admission.    Subjective Data      Cognition  Cognition Arousal/Alertness: Awake/alert Behavior During Therapy: WFL for tasks assessed/performed Overall Cognitive Status: Within Functional Limits for tasks assessed    Balance     End of Session PT - End of Session Equipment Utilized During Treatment: Gait belt Activity Tolerance: Patient limited by fatigue Patient left: with call bell/phone within reach;with family/visitor present;in bed   GP     Kinslei Labine,KATHrine E 12/20/2012, 3:24 PM Carmelia Bake, PT, DPT 12/20/2012 Pager: [phone]

## 2012-12-22 DIAGNOSIS — N186 End stage renal disease: Secondary | ICD-10-CM | POA: Diagnosis not present

## 2012-12-22 DIAGNOSIS — D631 Anemia in chronic kidney disease: Secondary | ICD-10-CM | POA: Diagnosis not present

## 2012-12-22 DIAGNOSIS — N2581 Secondary hyperparathyroidism of renal origin: Secondary | ICD-10-CM | POA: Diagnosis not present

## 2012-12-24 DIAGNOSIS — N186 End stage renal disease: Secondary | ICD-10-CM | POA: Diagnosis not present

## 2012-12-24 DIAGNOSIS — D631 Anemia in chronic kidney disease: Secondary | ICD-10-CM | POA: Diagnosis not present

## 2012-12-24 DIAGNOSIS — N2581 Secondary hyperparathyroidism of renal origin: Secondary | ICD-10-CM | POA: Diagnosis not present

## 2012-12-27 ENCOUNTER — Non-Acute Institutional Stay (SKILLED_NURSING_FACILITY): Payer: Medicare Other | Admitting: Internal Medicine

## 2012-12-27 DIAGNOSIS — I150 Renovascular hypertension: Secondary | ICD-10-CM | POA: Diagnosis not present

## 2012-12-27 DIAGNOSIS — D631 Anemia in chronic kidney disease: Secondary | ICD-10-CM

## 2012-12-27 DIAGNOSIS — N186 End stage renal disease: Secondary | ICD-10-CM | POA: Diagnosis not present

## 2012-12-27 DIAGNOSIS — I509 Heart failure, unspecified: Secondary | ICD-10-CM

## 2012-12-27 DIAGNOSIS — I5020 Unspecified systolic (congestive) heart failure: Secondary | ICD-10-CM

## 2012-12-27 DIAGNOSIS — N2581 Secondary hyperparathyroidism of renal origin: Secondary | ICD-10-CM | POA: Diagnosis not present

## 2012-12-29 DIAGNOSIS — N186 End stage renal disease: Secondary | ICD-10-CM | POA: Diagnosis not present

## 2012-12-29 DIAGNOSIS — D631 Anemia in chronic kidney disease: Secondary | ICD-10-CM | POA: Diagnosis not present

## 2012-12-29 DIAGNOSIS — N2581 Secondary hyperparathyroidism of renal origin: Secondary | ICD-10-CM | POA: Diagnosis not present

## 2012-12-31 DIAGNOSIS — N186 End stage renal disease: Secondary | ICD-10-CM | POA: Diagnosis not present

## 2012-12-31 DIAGNOSIS — N2581 Secondary hyperparathyroidism of renal origin: Secondary | ICD-10-CM | POA: Diagnosis not present

## 2012-12-31 DIAGNOSIS — D631 Anemia in chronic kidney disease: Secondary | ICD-10-CM | POA: Diagnosis not present

## 2013-01-03 DIAGNOSIS — N2581 Secondary hyperparathyroidism of renal origin: Secondary | ICD-10-CM | POA: Diagnosis not present

## 2013-01-03 DIAGNOSIS — N186 End stage renal disease: Secondary | ICD-10-CM | POA: Diagnosis not present

## 2013-01-03 DIAGNOSIS — D631 Anemia in chronic kidney disease: Secondary | ICD-10-CM | POA: Diagnosis not present

## 2013-01-05 DIAGNOSIS — N2581 Secondary hyperparathyroidism of renal origin: Secondary | ICD-10-CM | POA: Diagnosis not present

## 2013-01-05 DIAGNOSIS — D631 Anemia in chronic kidney disease: Secondary | ICD-10-CM | POA: Diagnosis not present

## 2013-01-05 DIAGNOSIS — N186 End stage renal disease: Secondary | ICD-10-CM | POA: Diagnosis not present

## 2013-01-06 ENCOUNTER — Other Ambulatory Visit: Payer: Self-pay | Admitting: *Deleted

## 2013-01-06 MED ORDER — OXYCODONE-ACETAMINOPHEN 7.5-325 MG PO TABS: 1.0000 | ORAL_TABLET | Freq: Two times a day (BID) | ORAL | Status: DC | PRN

## 2013-01-07 DIAGNOSIS — N2581 Secondary hyperparathyroidism of renal origin: Secondary | ICD-10-CM | POA: Diagnosis not present

## 2013-01-07 DIAGNOSIS — D631 Anemia in chronic kidney disease: Secondary | ICD-10-CM | POA: Diagnosis not present

## 2013-01-07 DIAGNOSIS — N186 End stage renal disease: Secondary | ICD-10-CM | POA: Diagnosis not present

## 2013-01-10 DIAGNOSIS — N186 End stage renal disease: Secondary | ICD-10-CM | POA: Diagnosis not present

## 2013-01-10 DIAGNOSIS — N2581 Secondary hyperparathyroidism of renal origin: Secondary | ICD-10-CM | POA: Diagnosis not present

## 2013-01-10 DIAGNOSIS — D631 Anemia in chronic kidney disease: Secondary | ICD-10-CM | POA: Diagnosis not present

## 2013-01-11 ENCOUNTER — Non-Acute Institutional Stay (SKILLED_NURSING_FACILITY): Payer: Medicare Other | Admitting: Adult Health

## 2013-01-11 DIAGNOSIS — N186 End stage renal disease: Secondary | ICD-10-CM

## 2013-01-11 DIAGNOSIS — I150 Renovascular hypertension: Secondary | ICD-10-CM | POA: Diagnosis not present

## 2013-01-12 DIAGNOSIS — N186 End stage renal disease: Secondary | ICD-10-CM | POA: Diagnosis not present

## 2013-01-12 DIAGNOSIS — D631 Anemia in chronic kidney disease: Secondary | ICD-10-CM | POA: Diagnosis not present

## 2013-01-12 DIAGNOSIS — N2581 Secondary hyperparathyroidism of renal origin: Secondary | ICD-10-CM | POA: Diagnosis not present

## 2013-01-14 DIAGNOSIS — N186 End stage renal disease: Secondary | ICD-10-CM | POA: Diagnosis not present

## 2013-01-14 DIAGNOSIS — D631 Anemia in chronic kidney disease: Secondary | ICD-10-CM | POA: Diagnosis not present

## 2013-01-14 DIAGNOSIS — N2581 Secondary hyperparathyroidism of renal origin: Secondary | ICD-10-CM | POA: Diagnosis not present

## 2013-01-15 DIAGNOSIS — L89109 Pressure ulcer of unspecified part of back, unspecified stage: Secondary | ICD-10-CM

## 2013-01-15 DIAGNOSIS — M1990 Unspecified osteoarthritis, unspecified site: Secondary | ICD-10-CM | POA: Diagnosis not present

## 2013-01-15 DIAGNOSIS — I10 Essential (primary) hypertension: Secondary | ICD-10-CM

## 2013-01-15 DIAGNOSIS — N186 End stage renal disease: Secondary | ICD-10-CM | POA: Diagnosis not present

## 2013-01-15 DIAGNOSIS — L8992 Pressure ulcer of unspecified site, stage 2: Secondary | ICD-10-CM

## 2013-01-15 DIAGNOSIS — B181 Chronic viral hepatitis B without delta-agent: Secondary | ICD-10-CM | POA: Diagnosis not present

## 2013-01-15 DIAGNOSIS — M6281 Muscle weakness (generalized): Secondary | ICD-10-CM | POA: Diagnosis not present

## 2013-01-15 DIAGNOSIS — Z87440 Personal history of urinary (tract) infections: Secondary | ICD-10-CM | POA: Diagnosis not present

## 2013-01-15 DIAGNOSIS — Z992 Dependence on renal dialysis: Secondary | ICD-10-CM | POA: Diagnosis not present

## 2013-01-17 DIAGNOSIS — N2581 Secondary hyperparathyroidism of renal origin: Secondary | ICD-10-CM | POA: Diagnosis not present

## 2013-01-17 DIAGNOSIS — N186 End stage renal disease: Secondary | ICD-10-CM | POA: Diagnosis not present

## 2013-01-17 DIAGNOSIS — D631 Anemia in chronic kidney disease: Secondary | ICD-10-CM | POA: Diagnosis not present

## 2013-01-18 DIAGNOSIS — I871 Compression of vein: Secondary | ICD-10-CM | POA: Diagnosis not present

## 2013-01-18 DIAGNOSIS — T82898A Other specified complication of vascular prosthetic devices, implants and grafts, initial encounter: Secondary | ICD-10-CM | POA: Diagnosis not present

## 2013-01-18 DIAGNOSIS — N186 End stage renal disease: Secondary | ICD-10-CM | POA: Diagnosis not present

## 2013-01-19 ENCOUNTER — Encounter: Payer: Self-pay | Admitting: Vascular Surgery

## 2013-01-19 ENCOUNTER — Other Ambulatory Visit: Payer: Self-pay | Admitting: *Deleted

## 2013-01-19 DIAGNOSIS — N2581 Secondary hyperparathyroidism of renal origin: Secondary | ICD-10-CM | POA: Diagnosis not present

## 2013-01-19 DIAGNOSIS — D631 Anemia in chronic kidney disease: Secondary | ICD-10-CM | POA: Diagnosis not present

## 2013-01-19 DIAGNOSIS — N186 End stage renal disease: Secondary | ICD-10-CM | POA: Diagnosis not present

## 2013-01-20 DIAGNOSIS — M1990 Unspecified osteoarthritis, unspecified site: Secondary | ICD-10-CM | POA: Diagnosis not present

## 2013-01-20 DIAGNOSIS — L8992 Pressure ulcer of unspecified site, stage 2: Secondary | ICD-10-CM | POA: Diagnosis not present

## 2013-01-20 DIAGNOSIS — L89109 Pressure ulcer of unspecified part of back, unspecified stage: Secondary | ICD-10-CM | POA: Diagnosis not present

## 2013-01-20 DIAGNOSIS — M6281 Muscle weakness (generalized): Secondary | ICD-10-CM | POA: Diagnosis not present

## 2013-01-20 DIAGNOSIS — N186 End stage renal disease: Secondary | ICD-10-CM | POA: Diagnosis not present

## 2013-01-20 DIAGNOSIS — I10 Essential (primary) hypertension: Secondary | ICD-10-CM | POA: Diagnosis not present

## 2013-01-21 DIAGNOSIS — N186 End stage renal disease: Secondary | ICD-10-CM | POA: Diagnosis not present

## 2013-01-21 DIAGNOSIS — N2581 Secondary hyperparathyroidism of renal origin: Secondary | ICD-10-CM | POA: Diagnosis not present

## 2013-01-21 DIAGNOSIS — D631 Anemia in chronic kidney disease: Secondary | ICD-10-CM | POA: Diagnosis not present

## 2013-01-24 DIAGNOSIS — D631 Anemia in chronic kidney disease: Secondary | ICD-10-CM | POA: Diagnosis not present

## 2013-01-24 DIAGNOSIS — N186 End stage renal disease: Secondary | ICD-10-CM | POA: Diagnosis not present

## 2013-01-24 DIAGNOSIS — N2581 Secondary hyperparathyroidism of renal origin: Secondary | ICD-10-CM | POA: Diagnosis not present

## 2013-01-25 DIAGNOSIS — L8992 Pressure ulcer of unspecified site, stage 2: Secondary | ICD-10-CM | POA: Diagnosis not present

## 2013-01-25 DIAGNOSIS — M6281 Muscle weakness (generalized): Secondary | ICD-10-CM | POA: Diagnosis not present

## 2013-01-25 DIAGNOSIS — I10 Essential (primary) hypertension: Secondary | ICD-10-CM | POA: Diagnosis not present

## 2013-01-25 DIAGNOSIS — N186 End stage renal disease: Secondary | ICD-10-CM | POA: Diagnosis not present

## 2013-01-25 DIAGNOSIS — L89109 Pressure ulcer of unspecified part of back, unspecified stage: Secondary | ICD-10-CM | POA: Diagnosis not present

## 2013-01-25 DIAGNOSIS — M1990 Unspecified osteoarthritis, unspecified site: Secondary | ICD-10-CM | POA: Diagnosis not present

## 2013-01-26 DIAGNOSIS — N2581 Secondary hyperparathyroidism of renal origin: Secondary | ICD-10-CM | POA: Diagnosis not present

## 2013-01-26 DIAGNOSIS — D631 Anemia in chronic kidney disease: Secondary | ICD-10-CM | POA: Diagnosis not present

## 2013-01-26 DIAGNOSIS — N186 End stage renal disease: Secondary | ICD-10-CM | POA: Diagnosis not present

## 2013-01-27 DIAGNOSIS — M6281 Muscle weakness (generalized): Secondary | ICD-10-CM | POA: Diagnosis not present

## 2013-01-27 DIAGNOSIS — M1990 Unspecified osteoarthritis, unspecified site: Secondary | ICD-10-CM | POA: Diagnosis not present

## 2013-01-27 DIAGNOSIS — L89109 Pressure ulcer of unspecified part of back, unspecified stage: Secondary | ICD-10-CM | POA: Diagnosis not present

## 2013-01-27 DIAGNOSIS — I10 Essential (primary) hypertension: Secondary | ICD-10-CM | POA: Diagnosis not present

## 2013-01-27 DIAGNOSIS — N186 End stage renal disease: Secondary | ICD-10-CM | POA: Diagnosis not present

## 2013-01-27 DIAGNOSIS — I150 Renovascular hypertension: Secondary | ICD-10-CM | POA: Insufficient documentation

## 2013-01-27 DIAGNOSIS — D631 Anemia in chronic kidney disease: Secondary | ICD-10-CM | POA: Insufficient documentation

## 2013-01-27 DIAGNOSIS — L8992 Pressure ulcer of unspecified site, stage 2: Secondary | ICD-10-CM | POA: Diagnosis not present

## 2013-01-27 NOTE — Progress Notes (Signed)
Patient ID: Gregory [name], male   DOB: October 24, 1950, 62 y.o.   MRN: [id number]        HISTORY & PHYSICAL  DATE: 12/27/2012   FACILITY: [facility]  LEVEL OF CARE: SNF (31)  ALLERGIES:  No Known Allergies  CHIEF COMPLAINT:  Manage CHF, hypertension, and anemia of chronic kidney disease.    HISTORY OF PRESENT ILLNESS:  The patient is a 62 year-old, African-American male who was hospitalized secondary to diffuse anasarca.  After hospitalization, he is admitted to this facility for short-term rehabilitation.   He has the following problems:    CHF:  The patient has  chronic lower extremity swelling.  Patient was admitted with diffuse anasarca and he underwent hemodialysis.  The patient does not relate significant weight changes, denies sob, DOE, orthopnea, PNDs, palpitations or chest pain.  CHF remains stable.  No complications form the medications being used.    HTN: Pt 's HTN remains stable.  Denies CP, sob, DOE, pedal edema, headaches, dizziness or visual disturbances.  No complications from the medications currently being used.  Last BP :  130/80.    ANEMIA: The anemia has been stable. The patient denies fatigue, melena or hematochezia.  The patient is currently not on iron.   Last hemoglobin levels:  8, 8.2, 9.3.    PAST MEDICAL HISTORY :  Past Medical History  Diagnosis Date  . Renal disorder   . Hypertension   . CHF (congestive heart failure)   . Arthritis   . Chronic back pain     PAST SURGICAL HISTORY: None  SOCIAL HISTORY:  reports that he has never smoked. He does not have any smokeless tobacco history on file. He reports that he does not drink alcohol or use illicit drugs.  FAMILY HISTORY: None  CURRENT MEDICATIONS: Reviewed per MAR  REVIEW OF SYSTEMS:  See HPI otherwise 14 point ROS is negative.  PHYSICAL EXAMINATION  VS:  T 99.2       P 68      RR 18      BP 130/80      POX 97% room air        WT (Lb) 235    GENERAL: no acute distress, normal  body habitus EYES: conjunctivae normal, sclerae normal, normal eye lids MOUTH/THROAT: lips without lesions,no lesions in the mouth,tongue is without lesions,uvula elevates in midline NECK: supple, trachea midline, no neck masses, no thyroid tenderness, no thyromegaly LYMPHATICS: no LAN in the neck, no supraclavicular LAN RESPIRATORY: breathing is even & unlabored, BS CTAB CARDIAC: RRR, no murmur,no extra heart sounds EDEMA/VARICOSITIES: right upper extremity has +2 edema, bilateral lower extremities have +3 edema   ARTERIAL: pedal pulses nonpalpable   GI:  ABDOMEN: abdomen soft, normal BS, no masses, no tenderness  LIVER/SPLEEN: no hepatomegaly, no splenomegaly MUSCULOSKELETAL: HEAD: normal to inspection & palpation BACK: no kyphosis, scoliosis or spinal processes tenderness EXTREMITIES: LEFT UPPER EXTREMITY: strength decreased, range of motion normal   RIGHT UPPER EXTREMITY: strength decreased, range of motion moderate   LEFT LOWER EXTREMITY: strength decreased, range of motion minimal   RIGHT LOWER EXTREMITY: strength decreased, range of motion minimal   PSYCHIATRIC: the patient is alert & oriented to person, affect & behavior appropriate  LABS/RADIOLOGY: Hemoglobin 8, platelets 87, WBC 5.7.    BUN 62, creatinine 5.96, otherwise BMP normal.    Phosphorus 5.5, albumin 2.3.    Hepatitis B surface antigen negative.    Hepatitis B surface antibody positive.  Hepatitis B total antibody positive.    Chest x-ray showed small bilateral effusions.    EKG showed normal sinus rhythm.    Urine culture grew Citrobacter koseri.    2D-echo showed EF 30-35%, diffuse hypokinesis.    ASSESSMENT/PLAN:  CHF.  Adequately compensated.    Renovascular hypertension.  Well controlled.     Anemia of chronic kidney disease.  Reassess.    End-stage renal disease.  On hemodialysis.    UTI.  Patient was treated.    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    Check CBC and BMP.    I have  reviewed patient's medical records received at admission/from hospitalization.  CPT CODE: 91478

## 2013-01-28 DIAGNOSIS — D631 Anemia in chronic kidney disease: Secondary | ICD-10-CM | POA: Diagnosis not present

## 2013-01-28 DIAGNOSIS — N186 End stage renal disease: Secondary | ICD-10-CM | POA: Diagnosis not present

## 2013-01-28 DIAGNOSIS — N2581 Secondary hyperparathyroidism of renal origin: Secondary | ICD-10-CM | POA: Diagnosis not present

## 2013-01-31 DIAGNOSIS — D631 Anemia in chronic kidney disease: Secondary | ICD-10-CM | POA: Diagnosis not present

## 2013-01-31 DIAGNOSIS — N2581 Secondary hyperparathyroidism of renal origin: Secondary | ICD-10-CM | POA: Diagnosis not present

## 2013-01-31 DIAGNOSIS — N186 End stage renal disease: Secondary | ICD-10-CM | POA: Diagnosis not present

## 2013-02-01 ENCOUNTER — Ambulatory Visit: Payer: Medicare Other | Admitting: Nurse Practitioner

## 2013-02-01 DIAGNOSIS — N186 End stage renal disease: Secondary | ICD-10-CM | POA: Diagnosis not present

## 2013-02-01 DIAGNOSIS — M5137 Other intervertebral disc degeneration, lumbosacral region: Secondary | ICD-10-CM | POA: Diagnosis not present

## 2013-02-02 ENCOUNTER — Encounter: Payer: Self-pay | Admitting: Vascular Surgery

## 2013-02-02 DIAGNOSIS — D509 Iron deficiency anemia, unspecified: Secondary | ICD-10-CM | POA: Diagnosis not present

## 2013-02-02 DIAGNOSIS — N2581 Secondary hyperparathyroidism of renal origin: Secondary | ICD-10-CM | POA: Diagnosis not present

## 2013-02-02 DIAGNOSIS — Z23 Encounter for immunization: Secondary | ICD-10-CM | POA: Diagnosis not present

## 2013-02-02 DIAGNOSIS — N186 End stage renal disease: Secondary | ICD-10-CM | POA: Diagnosis not present

## 2013-02-02 DIAGNOSIS — D631 Anemia in chronic kidney disease: Secondary | ICD-10-CM | POA: Diagnosis not present

## 2013-02-03 ENCOUNTER — Encounter: Payer: Self-pay | Admitting: Vascular Surgery

## 2013-02-03 ENCOUNTER — Encounter: Payer: Self-pay | Admitting: *Deleted

## 2013-02-03 ENCOUNTER — Ambulatory Visit (INDEPENDENT_AMBULATORY_CARE_PROVIDER_SITE_OTHER): Payer: Medicare Other | Admitting: Vascular Surgery

## 2013-02-03 VITALS — BP 129/69 | HR 54 | Ht 75.0 in | Wt 192.0 lb

## 2013-02-03 DIAGNOSIS — N186 End stage renal disease: Secondary | ICD-10-CM

## 2013-02-03 DIAGNOSIS — T82898A Other specified complication of vascular prosthetic devices, implants and grafts, initial encounter: Secondary | ICD-10-CM | POA: Diagnosis not present

## 2013-02-03 NOTE — Progress Notes (Signed)
Patient is a 62 year old male referred by Dr. Augustin Coupe for evaluation of an aneurysmal right upper extremity AV fistula. The patient has had the fistula for 3 years. He has had no episodes of bleeding. He does have an area now of ulceration. Recent angioplasty was performed by Dr. Augustin Coupe. The films were reviewed today. There was essentially total recoil post angioplasty. Patient states the fistula is working well. He has no signs or symptoms of steal in his right hand. He dialyzes Monday Wednesday and Friday at [facility].  Review of systems: He denies shortness of breath. He denies chest pain. He denies any fever.  Physical exam:   Filed Vitals:   02/03/13 0953  BP: 129/69  Pulse: 54  Height: 6\' 3"  (1.905 m)  Weight: 192 lb (87.091 kg)  SpO2: 100%    Right upper extremity: Patent right brachiocephalic AV fistula approximately 5 cm in diameter extending from the antecubital area to the shoulder suggestion of narrowing at this level, ulceration mid fistula with slightly thin skin  Data: Recent shuntogram is reviewed showing a diffusely enlarged fistula up with a segment of narrowing in the distal third  Assessment: Aneurysmal degeneration right arm AV fistula with possible narrowing above the area of aneurysmal degeneration  Plan: Plication of right upper extremity AV fistula with placement Diatek catheter to rest the fistula at that time the narrowed segment may also need to be patched at the time of plication.  Risks benefits possible complications procedure details were explained the patient today including but not limited to fistula thrombosis infection bleeding understands and agrees to proceed   [name], MD [facility] [place] Office: [phone] Pager: [phone]

## 2013-02-04 ENCOUNTER — Encounter: Payer: Self-pay | Admitting: *Deleted

## 2013-02-04 ENCOUNTER — Other Ambulatory Visit: Payer: Self-pay | Admitting: *Deleted

## 2013-02-08 DIAGNOSIS — I10 Essential (primary) hypertension: Secondary | ICD-10-CM | POA: Diagnosis not present

## 2013-02-08 DIAGNOSIS — M6281 Muscle weakness (generalized): Secondary | ICD-10-CM | POA: Diagnosis not present

## 2013-02-08 DIAGNOSIS — L89109 Pressure ulcer of unspecified part of back, unspecified stage: Secondary | ICD-10-CM | POA: Diagnosis not present

## 2013-02-08 DIAGNOSIS — L8992 Pressure ulcer of unspecified site, stage 2: Secondary | ICD-10-CM | POA: Diagnosis not present

## 2013-02-08 DIAGNOSIS — N186 End stage renal disease: Secondary | ICD-10-CM | POA: Diagnosis not present

## 2013-02-08 DIAGNOSIS — M1990 Unspecified osteoarthritis, unspecified site: Secondary | ICD-10-CM | POA: Diagnosis not present

## 2013-02-10 DIAGNOSIS — N186 End stage renal disease: Secondary | ICD-10-CM | POA: Diagnosis not present

## 2013-02-10 DIAGNOSIS — M6281 Muscle weakness (generalized): Secondary | ICD-10-CM | POA: Diagnosis not present

## 2013-02-10 DIAGNOSIS — L8992 Pressure ulcer of unspecified site, stage 2: Secondary | ICD-10-CM | POA: Diagnosis not present

## 2013-02-10 DIAGNOSIS — M1990 Unspecified osteoarthritis, unspecified site: Secondary | ICD-10-CM | POA: Diagnosis not present

## 2013-02-10 DIAGNOSIS — L89109 Pressure ulcer of unspecified part of back, unspecified stage: Secondary | ICD-10-CM | POA: Diagnosis not present

## 2013-02-10 DIAGNOSIS — I10 Essential (primary) hypertension: Secondary | ICD-10-CM | POA: Diagnosis not present

## 2013-02-15 DIAGNOSIS — N186 End stage renal disease: Secondary | ICD-10-CM | POA: Diagnosis not present

## 2013-02-15 DIAGNOSIS — M1990 Unspecified osteoarthritis, unspecified site: Secondary | ICD-10-CM | POA: Diagnosis not present

## 2013-02-15 DIAGNOSIS — M6281 Muscle weakness (generalized): Secondary | ICD-10-CM | POA: Diagnosis not present

## 2013-02-15 DIAGNOSIS — L89109 Pressure ulcer of unspecified part of back, unspecified stage: Secondary | ICD-10-CM | POA: Diagnosis not present

## 2013-02-15 DIAGNOSIS — I10 Essential (primary) hypertension: Secondary | ICD-10-CM | POA: Diagnosis not present

## 2013-02-15 DIAGNOSIS — L8992 Pressure ulcer of unspecified site, stage 2: Secondary | ICD-10-CM | POA: Diagnosis not present

## 2013-02-17 DIAGNOSIS — M6281 Muscle weakness (generalized): Secondary | ICD-10-CM | POA: Diagnosis not present

## 2013-02-17 DIAGNOSIS — M1990 Unspecified osteoarthritis, unspecified site: Secondary | ICD-10-CM | POA: Diagnosis not present

## 2013-02-17 DIAGNOSIS — L8992 Pressure ulcer of unspecified site, stage 2: Secondary | ICD-10-CM | POA: Diagnosis not present

## 2013-02-17 DIAGNOSIS — N186 End stage renal disease: Secondary | ICD-10-CM | POA: Diagnosis not present

## 2013-02-17 DIAGNOSIS — I10 Essential (primary) hypertension: Secondary | ICD-10-CM | POA: Diagnosis not present

## 2013-02-17 DIAGNOSIS — L89109 Pressure ulcer of unspecified part of back, unspecified stage: Secondary | ICD-10-CM | POA: Diagnosis not present

## 2013-02-17 NOTE — Progress Notes (Signed)
Anesthesia Chart Review:  Patient is a 62 year old male posted for ligation of RUE AVF and insertion of a Diatek catheter on 02/22/13 by Dr. Oneida Alar. Notes indicate the narrowed segment may have to be patched as well.  Patient is on hemodialysis MWF at [facility] He is scheduled to be a same day work-up.  He is currently in hemodialysis, but our PAT nurse is scheduled to call him this evening.  Nephrologist is Dr. Jamal Maes.  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 PCP is listed as Hassell Done, NP [phone] I think he is actually not scheduled to see her as a new patient until 02/24/13.  EKG on 12/11/12 showed NSR, possible LAE, non-specific T wave abnormality, prolonged QT.  Echo on 12/12/12 showed: - Left ventricle: The cavity size was mildly dilated. Wall thickness was increased in a pattern of moderate LVH. Systolic function was moderately to severely reduced. The estimated ejection fraction was in the range of 30% to 35%. Diffuse hypokinesis. Doppler parameters are consistent with abnormal left ventricular relaxation (grade 1 diastolic dysfunction). Doppler parameters are consistent with high ventricular filling pressure. - Ventricular septum: The contour showed diastolic flattening and systolic flattening. - Mitral valve: Calcified annulus. Mild regurgitation. - Left atrium: The atrium was mildly dilated. - Right ventricle: The cavity size was severely dilated. Systolic function was severely reduced. - Right atrium: The atrium was severely dilated. - Pulmonary arteries: PA peak pressure: 57mm Hg (S). - Pericardium, extracardiac: There was a left pleural effusion. - Impressions: There is biventricular  dysfunction with RV dyfunction worse than LV. Impressions: There is biventricular dysfunction with RV dysfunction worse than LV.  CXR on 12/11/12 (in the setting of anasarca with CKD stage V--hemodialysis initiated) showed: Cardiomegaly with small bilateral pleural effusions and bilateral lower lung atelectasis/airspace disease. Pneumonia is not excluded.  Labs from 12/20/12 showed H/H 8.0/23.7, PLT 87, BUN/Cr 62/5.96.  He will get an ISTAT on arrival.  I'll also add a platelet count with his history of thrombocytopenia.  Will defer decision for repeat CXR to the assigned anesthesiologist after evaluation.  He will be getting a CXR post-operatively if a catheter is placed.  I reviewed history and recent echo findings with anesthesiologist Dr. Tamala Julian. Patient has been undergoing hemodialysis for the past two months.  It does not appear that he has been referred to cardiology for LV dysfunction at this point, but he is scheduled to get established with Hassell Done, NP at [facility] later this month.  He will be further evaluated by his assigned anesthesiologist, but if no acute CV/CHF symptoms and follow-up labs are acceptable then it is anticipated that he can proceed as planned.  George Hugh [facility]/Anesthesiology Phone [phone] 02/18/2013 3:35 PM

## 2013-02-21 ENCOUNTER — Encounter (HOSPITAL_COMMUNITY): Payer: Self-pay | Admitting: *Deleted

## 2013-02-21 MED ORDER — DEXTROSE 5 % IV SOLN
1.5000 g | INTRAVENOUS | Status: AC
  Administered 2013-02-22: 1.5 g via INTRAVENOUS
  Filled 2013-02-21: qty 1.5

## 2013-02-22 ENCOUNTER — Ambulatory Visit (HOSPITAL_COMMUNITY): Payer: Medicare Other

## 2013-02-22 ENCOUNTER — Ambulatory Visit (HOSPITAL_COMMUNITY): Payer: Medicare Other | Admitting: Vascular Surgery

## 2013-02-22 ENCOUNTER — Encounter (HOSPITAL_COMMUNITY)
Admission: RE | Disposition: A | Discharge status: Home / Self Care | Payer: Self-pay | Source: Ambulatory Visit | Attending: Vascular Surgery

## 2013-02-22 ENCOUNTER — Encounter (HOSPITAL_COMMUNITY): Payer: Self-pay | Admitting: *Deleted

## 2013-02-22 ENCOUNTER — Ambulatory Visit (HOSPITAL_COMMUNITY)
Admission: RE | Admit: 2013-02-22 | Discharge: 2013-02-22 | Disposition: A | Discharge status: Home / Self Care | Payer: Medicare Other | Source: Ambulatory Visit | Attending: Vascular Surgery | Admitting: Vascular Surgery

## 2013-02-22 ENCOUNTER — Encounter (HOSPITAL_COMMUNITY): Payer: Medicare Other | Admitting: Vascular Surgery

## 2013-02-22 DIAGNOSIS — T82898A Other specified complication of vascular prosthetic devices, implants and grafts, initial encounter: Secondary | ICD-10-CM | POA: Insufficient documentation

## 2013-02-22 DIAGNOSIS — I120 Hypertensive chronic kidney disease with stage 5 chronic kidney disease or end stage renal disease: Secondary | ICD-10-CM | POA: Insufficient documentation

## 2013-02-22 DIAGNOSIS — J90 Pleural effusion, not elsewhere classified: Secondary | ICD-10-CM | POA: Diagnosis not present

## 2013-02-22 DIAGNOSIS — M129 Arthropathy, unspecified: Secondary | ICD-10-CM | POA: Insufficient documentation

## 2013-02-22 DIAGNOSIS — M549 Dorsalgia, unspecified: Secondary | ICD-10-CM | POA: Diagnosis not present

## 2013-02-22 DIAGNOSIS — Z992 Dependence on renal dialysis: Secondary | ICD-10-CM | POA: Insufficient documentation

## 2013-02-22 DIAGNOSIS — I509 Heart failure, unspecified: Secondary | ICD-10-CM | POA: Diagnosis not present

## 2013-02-22 DIAGNOSIS — Y832 Surgical operation with anastomosis, bypass or graft as the cause of abnormal reaction of the patient, or of later complication, without mention of misadventure at the time of the procedure: Secondary | ICD-10-CM | POA: Insufficient documentation

## 2013-02-22 DIAGNOSIS — N186 End stage renal disease: Secondary | ICD-10-CM | POA: Insufficient documentation

## 2013-02-22 DIAGNOSIS — D696 Thrombocytopenia, unspecified: Secondary | ICD-10-CM | POA: Insufficient documentation

## 2013-02-22 DIAGNOSIS — G8929 Other chronic pain: Secondary | ICD-10-CM | POA: Insufficient documentation

## 2013-02-22 DIAGNOSIS — J9819 Other pulmonary collapse: Secondary | ICD-10-CM | POA: Diagnosis not present

## 2013-02-22 HISTORY — PX: LIGATION OF ARTERIOVENOUS  FISTULA: SHX5948

## 2013-02-22 HISTORY — PX: INSERTION OF DIALYSIS CATHETER: SHX1324

## 2013-02-22 LAB — POCT I-STAT 4, (NA,K, GLUC, HGB,HCT)
Glucose, Bld: 87 mg/dL (ref 70–99)
HCT: 40 % (ref 39.0–52.0)
Hemoglobin: 13.6 g/dL (ref 13.0–17.0)
Potassium: 3.3 mEq/L — ABNORMAL LOW (ref 3.5–5.1)
Sodium: 137 mEq/L (ref 135–145)

## 2013-02-22 SURGERY — LIGATION OF ARTERIOVENOUS  FISTULA
Anesthesia: General | Site: Arm Upper | Laterality: Right | Wound class: Clean

## 2013-02-22 MED ORDER — ONDANSETRON HCL 4 MG/2ML IJ SOLN
INTRAMUSCULAR | Status: DC | PRN
  Administered 2013-02-22: 4 mg via INTRAVENOUS

## 2013-02-22 MED ORDER — ESMOLOL HCL 10 MG/ML IV SOLN
INTRAVENOUS | Status: DC | PRN
  Administered 2013-02-22 (×2): 20 mg via INTRAVENOUS
  Administered 2013-02-22: 10 mg via INTRAVENOUS

## 2013-02-22 MED ORDER — ROCURONIUM BROMIDE 100 MG/10ML IV SOLN
INTRAVENOUS | Status: DC | PRN
  Administered 2013-02-22: 40 mg via INTRAVENOUS

## 2013-02-22 MED ORDER — THROMBIN 20000 UNITS EX SOLR
CUTANEOUS | Status: AC
  Filled 2013-02-22: qty 20000

## 2013-02-22 MED ORDER — OXYCODONE HCL 5 MG PO TABS
5.0000 mg | ORAL_TABLET | Freq: Once | ORAL | Status: AC | PRN
  Administered 2013-02-22: 5 mg via ORAL

## 2013-02-22 MED ORDER — OXYCODONE HCL 5 MG PO TABS
ORAL_TABLET | ORAL | Status: AC
  Filled 2013-02-22: qty 1

## 2013-02-22 MED ORDER — FENTANYL CITRATE 0.05 MG/ML IJ SOLN
INTRAMUSCULAR | Status: DC | PRN
  Administered 2013-02-22 (×7): 50 ug via INTRAVENOUS

## 2013-02-22 MED ORDER — OXYCODONE-ACETAMINOPHEN 5-325 MG PO TABS: 1.0000 | ORAL_TABLET | Freq: Four times a day (QID) | ORAL | Status: DC | PRN

## 2013-02-22 MED ORDER — HEPARIN SODIUM (PORCINE) 1000 UNIT/ML IJ SOLN
INTRAMUSCULAR | Status: AC
  Filled 2013-02-22: qty 1

## 2013-02-22 MED ORDER — HEPARIN SODIUM (PORCINE) 1000 UNIT/ML IJ SOLN
INTRAMUSCULAR | Status: DC | PRN
  Administered 2013-02-22: 8000 [IU] via INTRAVENOUS

## 2013-02-22 MED ORDER — PROTAMINE SULFATE 10 MG/ML IV SOLN
INTRAVENOUS | Status: DC | PRN
  Administered 2013-02-22: 10 mg via INTRAVENOUS
  Administered 2013-02-22: 20 mg via INTRAVENOUS
  Administered 2013-02-22: 30 mg via INTRAVENOUS
  Administered 2013-02-22: 20 mg via INTRAVENOUS

## 2013-02-22 MED ORDER — EPHEDRINE SULFATE 50 MG/ML IJ SOLN
INTRAMUSCULAR | Status: DC | PRN
  Administered 2013-02-22: 10 mg via INTRAVENOUS
  Administered 2013-02-22: 5 mg via INTRAVENOUS
  Administered 2013-02-22: 10 mg via INTRAVENOUS
  Administered 2013-02-22 (×2): 5 mg via INTRAVENOUS
  Administered 2013-02-22: 10 mg via INTRAVENOUS
  Administered 2013-02-22: 5 mg via INTRAVENOUS

## 2013-02-22 MED ORDER — HEPARIN SODIUM (PORCINE) 1000 UNIT/ML IJ SOLN
INTRAMUSCULAR | Status: DC | PRN
  Administered 2013-02-22: 1000 [IU] via INTRAVENOUS

## 2013-02-22 MED ORDER — PROPOFOL 10 MG/ML IV BOLUS
INTRAVENOUS | Status: DC | PRN
  Administered 2013-02-22: 160 mg via INTRAVENOUS
  Administered 2013-02-22: 40 mg via INTRAVENOUS

## 2013-02-22 MED ORDER — FENTANYL CITRATE 0.05 MG/ML IJ SOLN
INTRAMUSCULAR | Status: AC
  Filled 2013-02-22: qty 2

## 2013-02-22 MED ORDER — SODIUM CHLORIDE 0.9 % IR SOLN
Status: DC | PRN
  Administered 2013-02-22: 13:00:00

## 2013-02-22 MED ORDER — ARTIFICIAL TEARS OP OINT
TOPICAL_OINTMENT | OPHTHALMIC | Status: DC | PRN
  Administered 2013-02-22: 1 via OPHTHALMIC

## 2013-02-22 MED ORDER — LIDOCAINE HCL (CARDIAC) 20 MG/ML IV SOLN
INTRAVENOUS | Status: DC | PRN
  Administered 2013-02-22: 100 mg via INTRAVENOUS

## 2013-02-22 MED ORDER — FENTANYL CITRATE 0.05 MG/ML IJ SOLN
25.0000 ug | INTRAMUSCULAR | Status: DC | PRN
  Administered 2013-02-22 (×2): 50 ug via INTRAVENOUS

## 2013-02-22 MED ORDER — NEOSTIGMINE METHYLSULFATE 1 MG/ML IJ SOLN
INTRAMUSCULAR | Status: DC | PRN
  Administered 2013-02-22: 3 mg via INTRAVENOUS

## 2013-02-22 MED ORDER — 0.9 % SODIUM CHLORIDE (POUR BTL) OPTIME
TOPICAL | Status: DC | PRN
  Administered 2013-02-22: 1000 mL

## 2013-02-22 MED ORDER — GLYCOPYRROLATE 0.2 MG/ML IJ SOLN
INTRAMUSCULAR | Status: DC | PRN
  Administered 2013-02-22: 0.4 mg via INTRAVENOUS

## 2013-02-22 MED ORDER — FENTANYL CITRATE 0.05 MG/ML IJ SOLN: 50.0000 ug | Freq: Once | INTRAMUSCULAR | Status: DC

## 2013-02-22 MED ORDER — OXYCODONE HCL 5 MG PO TABS
5.0000 mg | ORAL_TABLET | Freq: Once | ORAL | Status: AC
  Administered 2013-02-22: 5 mg via ORAL

## 2013-02-22 MED ORDER — LIDOCAINE HCL (PF) 1 % IJ SOLN
INTRAMUSCULAR | Status: AC
  Filled 2013-02-22: qty 30

## 2013-02-22 MED ORDER — PHENYLEPHRINE HCL 10 MG/ML IJ SOLN
INTRAMUSCULAR | Status: DC | PRN
  Administered 2013-02-22: 80 ug via INTRAVENOUS

## 2013-02-22 MED ORDER — OXYCODONE HCL 5 MG/5ML PO SOLN: 5.0000 mg | Freq: Once | ORAL | Status: AC | PRN

## 2013-02-22 MED ORDER — MIDAZOLAM HCL 2 MG/2ML IJ SOLN: 1.0000 mg | INTRAMUSCULAR | Status: DC | PRN

## 2013-02-22 MED ORDER — SODIUM CHLORIDE 0.9 % IV SOLN
INTRAVENOUS | Status: DC
  Administered 2013-02-22: 20 mL/h via INTRAVENOUS

## 2013-02-22 MED ORDER — SODIUM CHLORIDE 0.9 % IV SOLN
INTRAVENOUS | Status: DC | PRN
  Administered 2013-02-22 (×2): via INTRAVENOUS

## 2013-02-22 MED ORDER — MIDAZOLAM HCL 5 MG/5ML IJ SOLN
INTRAMUSCULAR | Status: DC | PRN
  Administered 2013-02-22 (×2): 1 mg via INTRAVENOUS

## 2013-02-22 SURGICAL SUPPLY — 68 items
BAG DECANTER FOR FLEXI CONT (MISCELLANEOUS) IMPLANT
BANDAGE ELASTIC 4 VELCRO ST LF (GAUZE/BANDAGES/DRESSINGS) ×3 IMPLANT
CANISTER SUCTION 2500CC (MISCELLANEOUS) ×3 IMPLANT
CATH CANNON HEMO 15F 50CM (CATHETERS) IMPLANT
CATH CANNON HEMO 15FR 19 (HEMODIALYSIS SUPPLIES) IMPLANT
CATH CANNON HEMO 15FR 23CM (HEMODIALYSIS SUPPLIES) ×3 IMPLANT
CATH CANNON HEMO 15FR 31CM (HEMODIALYSIS SUPPLIES) IMPLANT
CATH CANNON HEMO 15FR 32CM (HEMODIALYSIS SUPPLIES) IMPLANT
CATH STRAIGHT 5FR 65CM (CATHETERS) IMPLANT
CHLORAPREP W/TINT 26ML (MISCELLANEOUS) ×3 IMPLANT
CLIP TI MEDIUM 6 (CLIP) ×3 IMPLANT
CLIP TI WIDE RED SMALL 6 (CLIP) ×3 IMPLANT
COVER PROBE W GEL 5X96 (DRAPES) ×3 IMPLANT
COVER SURGICAL LIGHT HANDLE (MISCELLANEOUS) ×3 IMPLANT
DECANTER SPIKE VIAL GLASS SM (MISCELLANEOUS) IMPLANT
DERMABOND ADVANCED (GAUZE/BANDAGES/DRESSINGS) ×1
DERMABOND ADVANCED .7 DNX12 (GAUZE/BANDAGES/DRESSINGS) ×2 IMPLANT
DRAPE C-ARM 42X72 X-RAY (DRAPES) ×3 IMPLANT
DRAPE CHEST BREAST 15X10 FENES (DRAPES) ×3 IMPLANT
ELECT REM PT RETURN 9FT ADLT (ELECTROSURGICAL) ×3
ELECTRODE REM PT RTRN 9FT ADLT (ELECTROSURGICAL) ×2 IMPLANT
GAUZE SPONGE 2X2 8PLY STRL LF (GAUZE/BANDAGES/DRESSINGS) ×2 IMPLANT
GAUZE SPONGE 4X4 16PLY XRAY LF (GAUZE/BANDAGES/DRESSINGS) ×3 IMPLANT
GEL ULTRASOUND 20GR AQUASONIC (MISCELLANEOUS) IMPLANT
GLOVE BIO SURGEON STRL SZ7.5 (GLOVE) ×6 IMPLANT
GLOVE BIOGEL PI IND STRL 6.5 (GLOVE) ×4 IMPLANT
GLOVE BIOGEL PI IND STRL 7.0 (GLOVE) ×2 IMPLANT
GLOVE BIOGEL PI INDICATOR 6.5 (GLOVE) ×2
GLOVE BIOGEL PI INDICATOR 7.0 (GLOVE) ×1
GLOVE ECLIPSE 6.0 STRL STRAW (GLOVE) ×3 IMPLANT
GLOVE ECLIPSE 6.5 STRL STRAW (GLOVE) ×6 IMPLANT
GLOVE SS BIOGEL STRL SZ 7 (GLOVE) ×2 IMPLANT
GLOVE SUPERSENSE BIOGEL SZ 7 (GLOVE) ×1
GOWN PREVENTION PLUS XLARGE (GOWN DISPOSABLE) ×3 IMPLANT
GOWN PREVENTION PLUS XXLARGE (GOWN DISPOSABLE) ×3 IMPLANT
GOWN STRL NON-REIN LRG LVL3 (GOWN DISPOSABLE) ×9 IMPLANT
KIT BASIN OR (CUSTOM PROCEDURE TRAY) ×3 IMPLANT
KIT ROOM TURNOVER OR (KITS) ×3 IMPLANT
LOOP VESSEL MINI RED (MISCELLANEOUS) IMPLANT
NEEDLE 18GX1X1/2 (RX/OR ONLY) (NEEDLE) ×3 IMPLANT
NEEDLE 22X1 1/2 (OR ONLY) (NEEDLE) ×3 IMPLANT
NEEDLE HYPO 25GX1X1/2 BEV (NEEDLE) IMPLANT
NS IRRIG 1000ML POUR BTL (IV SOLUTION) ×3 IMPLANT
PACK CV ACCESS (CUSTOM PROCEDURE TRAY) ×3 IMPLANT
PACK SURGICAL SETUP 50X90 (CUSTOM PROCEDURE TRAY) IMPLANT
PAD ARMBOARD 7.5X6 YLW CONV (MISCELLANEOUS) ×6 IMPLANT
SET MICROPUNCTURE 5F STIFF (MISCELLANEOUS) IMPLANT
SPONGE GAUZE 2X2 STER 10/PKG (GAUZE/BANDAGES/DRESSINGS) ×1
SPONGE GAUZE 4X4 12PLY (GAUZE/BANDAGES/DRESSINGS) ×3 IMPLANT
SPONGE SURGIFOAM ABS GEL 100 (HEMOSTASIS) IMPLANT
SUT ETHILON 3 0 PS 1 (SUTURE) ×3 IMPLANT
SUT PROLENE 5 0 C 1 24 (SUTURE) ×12 IMPLANT
SUT PROLENE 6 0 CC (SUTURE) ×6 IMPLANT
SUT SILK 0 (SUTURE) IMPLANT
SUT VIC AB 3-0 SH 27 (SUTURE) ×1
SUT VIC AB 3-0 SH 27X BRD (SUTURE) ×2 IMPLANT
SUT VICRYL 4-0 PS2 18IN ABS (SUTURE) ×6 IMPLANT
SYR 20CC LL (SYRINGE) IMPLANT
SYR 30ML LL (SYRINGE) IMPLANT
SYR 5ML LL (SYRINGE) ×6 IMPLANT
SYR CONTROL 10ML LL (SYRINGE) IMPLANT
SYRINGE 10CC LL (SYRINGE) IMPLANT
TAPE CLOTH SURG 4X10 WHT LF (GAUZE/BANDAGES/DRESSINGS) ×3 IMPLANT
TOWEL OR 17X24 6PK STRL BLUE (TOWEL DISPOSABLE) ×3 IMPLANT
TOWEL OR 17X26 10 PK STRL BLUE (TOWEL DISPOSABLE) ×6 IMPLANT
UNDERPAD 30X30 INCONTINENT (UNDERPADS AND DIAPERS) ×3 IMPLANT
WATER STERILE IRR 1000ML POUR (IV SOLUTION) ×3 IMPLANT
WIRE AMPLATZ SS-J .035X180CM (WIRE) IMPLANT

## 2013-02-22 NOTE — H&P (View-Only) (Signed)
Patient is a 62 year old male referred by Dr. Augustin Coupe for evaluation of an aneurysmal right upper extremity AV fistula. The patient has had the fistula for 3 years. He has had no episodes of bleeding. He does have an area now of ulceration. Recent angioplasty was performed by Dr. Augustin Coupe. The films were reviewed today. There was essentially total recoil post angioplasty. Patient states the fistula is working well. He has no signs or symptoms of steal in his right hand. He dialyzes Monday Wednesday and Friday at [facility].  Review of systems: He denies shortness of breath. He denies chest pain. He denies any fever.  Physical exam:   Filed Vitals:   02/03/13 0953  BP: 129/69  Pulse: 54  Height: 6\' 3"  (1.905 m)  Weight: 192 lb (87.091 kg)  SpO2: 100%    Right upper extremity: Patent right brachiocephalic AV fistula approximately 5 cm in diameter extending from the antecubital area to the shoulder suggestion of narrowing at this level, ulceration mid fistula with slightly thin skin  Data: Recent shuntogram is reviewed showing a diffusely enlarged fistula up with a segment of narrowing in the distal third  Assessment: Aneurysmal degeneration right arm AV fistula with possible narrowing above the area of aneurysmal degeneration  Plan: Plication of right upper extremity AV fistula with placement Diatek catheter to rest the fistula at that time the narrowed segment may also need to be patched at the time of plication.  Risks benefits possible complications procedure details were explained the patient today including but not limited to fistula thrombosis infection bleeding understands and agrees to proceed   [name], MD [facility] [place] Office: [phone] Pager: [phone]

## 2013-02-22 NOTE — Preoperative (Signed)
Beta Blockers   Reason not to administer Beta Blockers:Not Applicable 

## 2013-02-22 NOTE — Interval H&P Note (Signed)
History and Physical Interval Note:  02/22/2013 8:05 AM  Gregory [name]  has presented today for surgery, with the diagnosis of COMPLICATION WITH AVF,ESRD  The various methods of treatment have been discussed with the patient and family. After consideration of risks, benefits and other options for treatment, the patient has consented to  Procedure(s): LIGATION OF ARTERIOVENOUS  FISTULA (Right) INSERTION OF DIALYSIS CATHETER (N/A) as a surgical intervention .  The patient's history has been reviewed, patient examined, no change in status, stable for surgery.  I have reviewed the patient's chart and labs.  Questions were answered to the patient's satisfaction.     [name]

## 2013-02-22 NOTE — Interval H&P Note (Signed)
History and Physical Interval Note:  02/22/2013 12:00 PM  Gregory [name]  has presented today for surgery, with the diagnosis of COMPLICATION WITH AVF,ESRD  The various methods of treatment have been discussed with the patient and family. After consideration of risks, benefits and other options for treatment, the patient has consented to  Procedure(s): LIGATION OF ARTERIOVENOUS  FISTULA (Right) INSERTION OF DIALYSIS CATHETER (N/A) as a surgical intervention .  The patient's history has been reviewed, patient examined, no change in status, stable for surgery.  I have reviewed the patient's chart and labs.  Questions were answered to the patient's satisfaction.     FIELDS,CHARLES E

## 2013-02-22 NOTE — Anesthesia Preprocedure Evaluation (Addendum)
Anesthesia Evaluation  Patient identified by MRN, date of birth, ID band Patient awake    Reviewed: Allergy & Precautions, H&P , NPO status , Patient's Chart, lab work & pertinent test results  Airway Mallampati: II TM Distance: >3 FB Neck ROM: Full    Dental   Pulmonary  breath sounds clear to auscultation        Cardiovascular hypertension, +CHF Rhythm:Regular Rate:Normal     Neuro/Psych    GI/Hepatic   Endo/Other    Renal/GU ESRFRenal disease     Musculoskeletal   Abdominal   Peds  Hematology   Anesthesia Other Findings   Reproductive/Obstetrics                           Anesthesia Physical Anesthesia Plan  ASA: III  Anesthesia Plan: General   Post-op Pain Management:    Induction: Intravenous  Airway Management Planned: Oral ETT  Additional Equipment:   Intra-op Plan:   Post-operative Plan: Extubation in OR  Informed Consent: I have reviewed the patients History and Physical, chart, labs and discussed the procedure including the risks, benefits and alternatives for the proposed anesthesia with the patient or authorized representative who has indicated his/her understanding and acceptance.     Plan Discussed with: CRNA and Surgeon  Anesthesia Plan Comments:        Anesthesia Quick Evaluation

## 2013-02-22 NOTE — Anesthesia Procedure Notes (Signed)
Procedure Name: Intubation Date/Time: 02/22/2013 12:42 PM Performed by: Erik Obey Pre-anesthesia Checklist: Patient identified, Timeout performed, Emergency Drugs available, Suction available and Patient being monitored Patient Re-evaluated:Patient Re-evaluated prior to inductionOxygen Delivery Method: Circle system utilized Preoxygenation: Pre-oxygenation with 100% oxygen Intubation Type: IV induction Ventilation: Mask ventilation without difficulty Laryngoscope Size: Mac and 3 Grade View: Grade II Tube type: Oral Tube size: 7.0 mm Number of attempts: 1 Airway Equipment and Method: Stylet Placement Confirmation: ETT inserted through vocal cords under direct vision,  positive ETCO2 and breath sounds checked- equal and bilateral Secured at: 22 cm Tube secured with: Tape Dental Injury: Teeth and Oropharynx as per pre-operative assessment

## 2013-02-22 NOTE — Anesthesia Postprocedure Evaluation (Signed)
  Anesthesia Post-op Note  Patient: Gregory [name]  Procedure(s) Performed: Procedure(s): PLICATION OF ARTERIOVENOUS  FISTULA (Right) INSERTION OF DIALYSIS CATHETER (N/A)  Patient Location: PACU  Anesthesia Type:General  Level of Consciousness: awake and alert   Airway and Oxygen Therapy: Patient Spontanous Breathing  Post-op Pain: mild  Post-op Assessment: Post-op Vital signs reviewed, Patient's Cardiovascular Status Stable, Respiratory Function Stable, Patent Airway, No signs of Nausea or vomiting and Pain level controlled  Post-op Vital Signs: Reviewed and stable  Complications: No apparent anesthesia complications

## 2013-02-22 NOTE — Progress Notes (Signed)
Dialysis  Access report, CXR report faxed to [facility].

## 2013-02-22 NOTE — OR Nursing (Signed)
Right arm fistula ended 1530.

## 2013-02-22 NOTE — Transfer of Care (Signed)
Immediate Anesthesia Transfer of Care Note  Patient: Gregory [name]  Procedure(s) Performed: Procedure(s): PLICATION OF ARTERIOVENOUS  FISTULA (Right) INSERTION OF DIALYSIS CATHETER (N/A)  Patient Location: PACU  Anesthesia Type:General  Level of Consciousness: awake, alert  and oriented  Airway & Oxygen Therapy: Patient Spontanous Breathing and Patient connected to nasal cannula oxygen  Post-op Assessment: Report given to PACU RN, Post -op Vital signs reviewed and stable and Patient moving all extremities X 4  Post vital signs: Reviewed and stable  Complications: No apparent anesthesia complications

## 2013-02-22 NOTE — Op Note (Signed)
Procedure: #1 ultrasound neck #2 insertion of Diatek catheter #3 plication of right upper extremity AV fistula  Preoperative diagnosis: Aneurysmal degeneration right arm AV fistula   Postoperative diagnosis: Same  Anesthesia: Gen.  Assistant: Gerri Lins PA-C  Operative findings: #1 plication of proximal and middle third of right upper extremity AV fistula           #2 23 cm Diatek catheter left internal jugular vein  Operative details: After obtaining informed consent, the patient was taken to the operating room. The patient was placed in supine position on the operating room table. After administration of general anesthesia, attention was turned to the right upper extremity. The entire right upper extremity was prepped and draped in usual sterile fashion. A longitudinal incision was made over the central third of the fistula incorporating both areas of aneurysmal degeneration. The fistula was dissected free circumferentially throughout its course. The fistula was diffusely aneurysmal and this had caused kinking of the more distal fistula.  The patient was given 8000 units of intravenous heparin. The fistula was clamped proximally and distally above and below the aneurysms. The aneurysm was opened longitudinally and the redundant segment fistula excised. Each area of resection was then reapproximated using a running 5-0 Prolene suture. The clamps were removed and flow restored to the fistula. Hemostasis was obtained with administration of 80 mg of protamine and one repair stitch at the proximal anastomosis.  The subcutaneous tissues were reapproximated using a running 3-0 Vicryl suture. The skin was closed with a 4 0 Vicryl subcuticular stitch. Dermabond was applied to the skin incision.  Next, the patient's entire neck and chest were prepped and draped in usual sterile fashion. The patient was placed in Trendelenburg position. Ultrasound showed the right internal jugular vein to be small.  The  left internal jugular vein was of much better quality.  Ultrasound was used to identify the patient's left internal jugular vein. This had normal compressibility and respiratory variation.Using ultrasound guidance, the left internal jugular vein was successfully cannulated.  A 0.035 J-tipped guidewire was threaded into the left internal jugular vein and into the superior vena cava followed by the inferior vena cava under fluoroscopic guidance.   Next sequential 12 and 14 dilators were placed over the guidewire into the right atrium.  A 16 French dilator with a peel-away sheath was then placed over the guidewire into the right atrium.   The guidewire and dilator were removed. A 23 cm Diatek catheter was then placed through the peel away sheath into the right atrium.  The catheter was then tunneled subcutaneously, cut to length, and the hub attached. The catheter was noted to flush and draw easily. The catheter was inspected under fluoroscopy and found with its tip to be in the right atrium without any kinks throughout its course. The catheter was sutured to the skin with nylon sutures. The neck insertion site was closed with Vicryl stitch. The catheter was then loaded with concentrated Heparin solution. A dry sterile dressing was applied.   The patient tolerated procedure well and there were no complications. Instrument sponge and needle counts were correct at the end of the case. The patient was taken to the recovery room in stable condition. Chest x-ray will be obtained in the recovery room.  [name], MD [facility] [place] Office: [phone] Pager: [phone]

## 2013-02-23 ENCOUNTER — Telehealth: Payer: Self-pay | Admitting: Vascular Surgery

## 2013-02-23 NOTE — Telephone Encounter (Addendum)
Message copied by Gena Fray on Wed Feb 23, 2013  1:47 PM ------      Message from: Alfonso Patten      Created: Tue Feb 22, 2013  3:44 PM                   ----- Message -----         From: Ulyses Amor, PA-C         Sent: 02/22/2013   3:34 PM           To: Alfonso Patten, RN            F/U in 3 weeks s/p diatek and right arm AV fistula plication. ------  [id number]: lm for patient, dpm

## 2013-02-24 ENCOUNTER — Ambulatory Visit (INDEPENDENT_AMBULATORY_CARE_PROVIDER_SITE_OTHER): Payer: Medicare Other | Admitting: Nurse Practitioner

## 2013-02-24 ENCOUNTER — Encounter: Payer: Self-pay | Admitting: *Deleted

## 2013-02-24 ENCOUNTER — Encounter: Payer: Self-pay | Admitting: Nurse Practitioner

## 2013-02-24 VITALS — BP 130/78 | HR 66 | Temp 97.9°F | Resp 16 | Ht 76.25 in | Wt 180.0 lb

## 2013-02-24 DIAGNOSIS — Z23 Encounter for immunization: Secondary | ICD-10-CM | POA: Diagnosis not present

## 2013-02-24 DIAGNOSIS — M549 Dorsalgia, unspecified: Secondary | ICD-10-CM | POA: Diagnosis not present

## 2013-02-24 DIAGNOSIS — N186 End stage renal disease: Secondary | ICD-10-CM | POA: Diagnosis not present

## 2013-02-24 DIAGNOSIS — I10 Essential (primary) hypertension: Secondary | ICD-10-CM

## 2013-02-24 MED ORDER — TETANUS-DIPHTH-ACELL PERTUSSIS 5-2.5-18.5 LF-MCG/0.5 IM SUSP: 0.5000 mL | Freq: Once | INTRAMUSCULAR | Status: DC

## 2013-02-24 NOTE — Progress Notes (Signed)
Patient ID: Gregory [name], male   DOB: 1951/03/17, 62 y.o.   MRN: [id number]   No Known Allergies  Chief Complaint  Patient presents with  . Establish Care    NP establishing care  . Sore Throat    scratchy throat & coughing x 2-3 days    HPI: Patient is a 62 y.o. male seen in the office today to establish care; previous being seen at [facility] and did not have a PCP; here today to establish care.  Nephrologist: [facility] (unsure of name) Orthopedic: [place] who is managing his pain medication Recently had AV fissula placed   Has scratchy throat (had surgery done 2 days ago and this has been going on since) wife reports they warned him of this side effects; no fevers or chills Was diagnosed with kidney disease 4 year ago due to uncontrolled blood pressure; has been on dialysis since august going on MWF he has been having trouble with his legs; weakness and been working with therapy Review of Systems:   Review of Systems  Constitutional: Negative for fever, chills, weight loss and malaise/fatigue.  HENT: Positive for sore throat. Negative for congestion, ear discharge, ear pain, hearing loss, nosebleeds and tinnitus.   Eyes: Negative.        Wears corrective lens; saw dr Katy Fitch 1 year ago  Respiratory: Negative for cough, shortness of breath and wheezing.   Cardiovascular: Positive for leg swelling (occasionally). Negative for chest pain, palpitations and claudication.  Gastrointestinal: Negative for heartburn, nausea, vomiting, abdominal pain, diarrhea and constipation.  Genitourinary: Negative for dysuria and hematuria. Flank pain: knees, elbows.       ESRD; dialysis MWF  Musculoskeletal: Positive for back pain and joint pain. Negative for falls.  Skin: Negative.   Neurological: Negative for dizziness, tingling, tremors, sensory change, seizures, weakness and headaches.  Endo/Heme/Allergies: Negative for environmental allergies. Does not  bruise/bleed easily.  Psychiatric/Behavioral: Negative for depression. The patient is not nervous/anxious and does not have insomnia.      Past Medical History  Diagnosis Date  . Renal disorder   . Hypertension   . CHF (congestive heart failure)   . Arthritis   . Chronic back pain    Past Surgical History  Procedure Laterality Date  . Av fistula placement Right 07/26/2009   Social History:   reports that he has never smoked. He has never used smokeless tobacco. He reports that he does not drink alcohol or use illicit drugs.  Family History  Problem Relation Age of Onset  . Diabetes Father   . Hypertension Father   . Diabetes Sister     Medications: Patient's Medications  New Prescriptions   No medications on file  Previous Medications   ATENOLOL (TENORMIN) 100 MG TABLET    Take 100 mg by mouth daily. Take 1 and 1/2 tablet every day (to equal 150 mg QD)   CALCITRIOL (ROCALTROL) 0.25 MCG CAPSULE    Take 1 mcg by mouth 2 (two) times daily. Take 4 capsules by mouth once daily at the same time daily.   DIPTHERIA-TETANUS TOXOIDS (DECAVAC) 2-2 LF/0.5ML INJECTION    Inject 0.5 mLs into the muscle once.   OXYCODONE-ACETAMINOPHEN (PERCOCET/ROXICET) 5-325 MG PER TABLET    Take 1 tablet by mouth every 6 (six) hours as needed for pain.   PREDNISONE (DELTASONE) 5 MG TABLET    Take 5 mg by mouth daily.   TRAMADOL (ULTRAM) 50 MG TABLET    Take 50 mg by  mouth 2 (two) times daily as needed for pain.  Modified Medications   No medications on file  Discontinued Medications   ATENOLOL (TENORMIN) 100 MG TABLET    Take 150 mg by mouth daily.     Physical Exam:  Filed Vitals:   02/24/13 0917  BP: 130/78  Pulse: 66  Temp: 97.9 F (36.6 C)  TempSrc: Oral  Resp: 16  Height: 6' 4.25" (1.937 m)  Weight: 180 lb (81.647 kg)  SpO2: 99%   Physical Exam  Constitutional: He is oriented to person, place, and time and well-developed, well-nourished, and in no distress. No distress.  HENT:   Head: Normocephalic and atraumatic.  Right Ear: External ear normal.  Left Ear: External ear normal.  Nose: Nose normal.  Mouth/Throat: Oropharynx is clear and moist. No oropharyngeal exudate.  Eyes: Conjunctivae and EOM are normal. Pupils are equal, round, and reactive to light.  Neck: Normal range of motion. Neck supple. No thyromegaly present.  Cardiovascular: Normal rate, regular rhythm and normal heart sounds.   Pulmonary/Chest: Effort normal and breath sounds normal. No respiratory distress.  Abdominal: Soft. Bowel sounds are normal. He exhibits no distension. There is no tenderness.  Musculoskeletal: He exhibits edema (+1 bilaterally) and tenderness (to lumbar spine ).  Lymphadenopathy:    He has no cervical adenopathy.  Neurological: He is alert and oriented to person, place, and time.  Skin: Skin is warm and dry. He is not diaphoretic.  Left AV fissula dsg CDI; dialysis cath on right chest    Labs reviewed: Basic Metabolic Panel:  Recent Labs  12/18/12 0500 12/19/12 0442 12/20/12 0652 02/22/13 0823  NA 139 139 139 137  K 4.2 4.7 4.3 3.3*  CL 104 104 105  --   CO2 25 25 25   --   GLUCOSE 87 92 91 87  BUN 51* 57* 62*  --   CREATININE 5.23* 5.73* 5.96*  --   CALCIUM 7.6* 7.8* 7.8*  --   PHOS 5.2* 5.5* 5.5*  --    Liver Function Tests:  Recent Labs  12/11/12 1318  12/18/12 0500 12/19/12 0442 12/20/12 0652  AST 19  --   --   --   --   ALT 17  --   --   --   --   ALKPHOS 102  --   --   --   --   BILITOT 0.6  --   --   --   --   PROT 5.2*  --   --   --   --   ALBUMIN 2.8*  < > 2.4* 2.3* 2.3*  < > = values in this interval not displayed. No results found for this basename: LIPASE, AMYLASE,  in the last 8760 hours No results found for this basename: AMMONIA,  in the last 8760 hours CBC:  Recent Labs  12/17/12 0906 12/19/12 0442 12/20/12 0652 02/22/13 0821 02/22/13 0823  WBC 4.4 6.0 5.7  --   --   HGB 9.3* 8.2* 8.0*  --  13.6  HCT 27.5* 25.5* 23.7*  --   40.0  MCV 89.3 91.4 89.4  --   --   PLT 73* 74* 87* 125*  --    Lipid Panel: No results found for this basename: CHOL, HDL, LDLCALC, TRIG, CHOLHDL, LDLDIRECT,  in the last 8760 hours TSH: No results found for this basename: TSH,  in the last 8760 hours A1C: No components found with this basename: A1C,     Assessment/Plan 1.  ESRD on dialysis Following with nephrology; conts  Dialysis MWF; having new fissula looked at on Friday  2. Essential hypertension, benign Patient is stable; continue current regimen. Will monitor and make changes as necessary.  3. Back pain Currently on oxycodone; pain medication managed by ortho; with ongoing weakness in lower extremities and pain in back pt has been working with therapies    Will check fasting lipids and CMP today Pt to follow up in 4-6 weeks for physical and MMSE RX given for TDaP  Getting flu vaccine at dialysis

## 2013-02-24 NOTE — Patient Instructions (Signed)
Will check your cholesterol today  To follow up in 4-6 weeks for physical  Check with your dialysis doctor about the TDaP vaccine  May use chloraseptic spray for sore throat or cough drop

## 2013-02-25 LAB — COMPREHENSIVE METABOLIC PANEL
ALT: 9 IU/L (ref 0–44)
AST: 16 IU/L (ref 0–40)
Albumin/Globulin Ratio: 1.1 (ref 1.1–2.5)
Albumin: 3 g/dL — ABNORMAL LOW (ref 3.6–4.8)
BUN: 15 mg/dL (ref 8–27)
CO2: 28 mmol/L (ref 18–29)
Calcium: 8.6 mg/dL (ref 8.6–10.2)
Creatinine, Ser: 3.91 mg/dL — ABNORMAL HIGH (ref 0.76–1.27)
GFR calc Af Amer: 18 mL/min/{1.73_m2} — ABNORMAL LOW (ref >59)
GFR calc non Af Amer: 15 mL/min/{1.73_m2} — ABNORMAL LOW (ref >59)
Globulin, Total: 2.7 g/dL (ref 1.5–4.5)
Glucose: 78 mg/dL (ref 65–99)
Potassium: 3.9 mmol/L (ref 3.5–5.2)
Total Bilirubin: 0.6 mg/dL (ref 0.0–1.2)
Total Protein: 5.7 g/dL — ABNORMAL LOW (ref 6.0–8.5)

## 2013-02-25 LAB — LIPID PANEL
Chol/HDL Ratio: 2.4 ratio units (ref 0.0–5.0)
LDL Calculated: 76 mg/dL (ref 0–99)

## 2013-03-01 DIAGNOSIS — M6281 Muscle weakness (generalized): Secondary | ICD-10-CM | POA: Diagnosis not present

## 2013-03-01 DIAGNOSIS — M1990 Unspecified osteoarthritis, unspecified site: Secondary | ICD-10-CM | POA: Diagnosis not present

## 2013-03-01 DIAGNOSIS — N186 End stage renal disease: Secondary | ICD-10-CM | POA: Diagnosis not present

## 2013-03-01 DIAGNOSIS — L89109 Pressure ulcer of unspecified part of back, unspecified stage: Secondary | ICD-10-CM | POA: Diagnosis not present

## 2013-03-01 DIAGNOSIS — I10 Essential (primary) hypertension: Secondary | ICD-10-CM | POA: Diagnosis not present

## 2013-03-01 DIAGNOSIS — L8992 Pressure ulcer of unspecified site, stage 2: Secondary | ICD-10-CM | POA: Diagnosis not present

## 2013-03-04 DIAGNOSIS — N186 End stage renal disease: Secondary | ICD-10-CM | POA: Diagnosis not present

## 2013-03-07 DIAGNOSIS — D631 Anemia in chronic kidney disease: Secondary | ICD-10-CM | POA: Diagnosis not present

## 2013-03-07 DIAGNOSIS — N186 End stage renal disease: Secondary | ICD-10-CM | POA: Diagnosis not present

## 2013-03-07 DIAGNOSIS — D509 Iron deficiency anemia, unspecified: Secondary | ICD-10-CM | POA: Diagnosis not present

## 2013-03-07 DIAGNOSIS — N2581 Secondary hyperparathyroidism of renal origin: Secondary | ICD-10-CM | POA: Diagnosis not present

## 2013-03-07 DIAGNOSIS — E210 Primary hyperparathyroidism: Secondary | ICD-10-CM | POA: Diagnosis not present

## 2013-03-10 DIAGNOSIS — L89109 Pressure ulcer of unspecified part of back, unspecified stage: Secondary | ICD-10-CM | POA: Diagnosis not present

## 2013-03-10 DIAGNOSIS — L8992 Pressure ulcer of unspecified site, stage 2: Secondary | ICD-10-CM | POA: Diagnosis not present

## 2013-03-10 DIAGNOSIS — M6281 Muscle weakness (generalized): Secondary | ICD-10-CM | POA: Diagnosis not present

## 2013-03-10 DIAGNOSIS — I10 Essential (primary) hypertension: Secondary | ICD-10-CM | POA: Diagnosis not present

## 2013-03-10 DIAGNOSIS — N186 End stage renal disease: Secondary | ICD-10-CM | POA: Diagnosis not present

## 2013-03-10 DIAGNOSIS — M1990 Unspecified osteoarthritis, unspecified site: Secondary | ICD-10-CM | POA: Diagnosis not present

## 2013-03-15 ENCOUNTER — Encounter (HOSPITAL_COMMUNITY): Payer: Self-pay | Admitting: Vascular Surgery

## 2013-03-15 NOTE — OR Nursing (Signed)
LATE ENTRY: Entered Procedure End time

## 2013-03-16 ENCOUNTER — Encounter: Payer: Self-pay | Admitting: Vascular Surgery

## 2013-03-17 ENCOUNTER — Encounter: Payer: Self-pay | Admitting: Vascular Surgery

## 2013-03-17 ENCOUNTER — Ambulatory Visit (INDEPENDENT_AMBULATORY_CARE_PROVIDER_SITE_OTHER): Payer: Self-pay | Admitting: Vascular Surgery

## 2013-03-17 VITALS — BP 152/93 | HR 80 | Ht 76.0 in | Wt 169.8 lb

## 2013-03-17 DIAGNOSIS — N186 End stage renal disease: Secondary | ICD-10-CM

## 2013-03-17 MED ORDER — OXYCODONE-ACETAMINOPHEN 5-325 MG PO TABS: 1.0000 | ORAL_TABLET | ORAL | Status: DC | PRN

## 2013-03-17 NOTE — Progress Notes (Signed)
Patient is a 62 year old male who returns for followup today. He underwent plication of a right upper extremity AV fistula on October 21. He also had a Diatek catheter placed at that time. He states that the soreness in his arm and this persistent but improved. He was given a renewal for Percocet prescription today. He'll states that he has been losing weight. I advised him to discuss with his dialysis center his protein intake. He does not appear to be hypovolemic or exhibiting symptoms of this. However he does appear to be losing muscle mass.   Physical exam:  Filed Vitals:   03/17/13 0841  BP: 152/93  Pulse: 80  Height: 6\' 4"  (1.93 m)  Weight: 169 lb 12.8 oz (77.021 kg)  SpO2: 100%    Right upper extremity: Healing upper arm incision easily palpable thrill audible bruit in fistula right and warm well-perfused  Assessment: Healing plication right arm AV fistula. Patient was given a note today to resume cannulation of the fistula in 2 months. He will followup with [place] on as-needed basis. He will discuss with his nephrologist and dialysis center his protein intake to try to regain some weight.  [name], MD [facility] Office: [phone] Pager: [phone]

## 2013-03-24 ENCOUNTER — Encounter: Payer: Medicare Other | Admitting: Nurse Practitioner

## 2013-03-29 DIAGNOSIS — M5137 Other intervertebral disc degeneration, lumbosacral region: Secondary | ICD-10-CM | POA: Diagnosis not present

## 2013-04-03 DIAGNOSIS — N186 End stage renal disease: Secondary | ICD-10-CM | POA: Diagnosis not present

## 2013-04-04 DIAGNOSIS — N2581 Secondary hyperparathyroidism of renal origin: Secondary | ICD-10-CM | POA: Diagnosis not present

## 2013-04-04 DIAGNOSIS — D631 Anemia in chronic kidney disease: Secondary | ICD-10-CM | POA: Diagnosis not present

## 2013-04-04 DIAGNOSIS — N186 End stage renal disease: Secondary | ICD-10-CM | POA: Diagnosis not present

## 2013-04-07 ENCOUNTER — Ambulatory Visit (INDEPENDENT_AMBULATORY_CARE_PROVIDER_SITE_OTHER): Payer: Medicare Other | Admitting: Nurse Practitioner

## 2013-04-07 ENCOUNTER — Encounter: Payer: Self-pay | Admitting: Nurse Practitioner

## 2013-04-07 VITALS — BP 152/80 | HR 75 | Temp 97.9°F | Resp 14 | Ht 74.25 in | Wt 168.2 lb

## 2013-04-07 DIAGNOSIS — N186 End stage renal disease: Secondary | ICD-10-CM

## 2013-04-07 DIAGNOSIS — R634 Abnormal weight loss: Secondary | ICD-10-CM | POA: Diagnosis not present

## 2013-04-07 DIAGNOSIS — I10 Essential (primary) hypertension: Secondary | ICD-10-CM | POA: Diagnosis not present

## 2013-04-07 DIAGNOSIS — M549 Dorsalgia, unspecified: Secondary | ICD-10-CM

## 2013-04-07 DIAGNOSIS — R39198 Other difficulties with micturition: Secondary | ICD-10-CM

## 2013-04-07 DIAGNOSIS — Z1211 Encounter for screening for malignant neoplasm of colon: Secondary | ICD-10-CM

## 2013-04-07 DIAGNOSIS — D631 Anemia in chronic kidney disease: Secondary | ICD-10-CM | POA: Diagnosis not present

## 2013-04-07 DIAGNOSIS — Z125 Encounter for screening for malignant neoplasm of prostate: Secondary | ICD-10-CM

## 2013-04-07 MED ORDER — MIRTAZAPINE 7.5 MG HALF TABLET: 7.5000 mg | ORAL_TABLET | Freq: Every day | ORAL | Status: DC

## 2013-04-07 MED ORDER — MIRTAZAPINE 15 MG PO TABS: 7.5000 mg | ORAL_TABLET | Freq: Every day | ORAL | Status: DC

## 2013-04-07 NOTE — Progress Notes (Signed)
Patient ID: Gregory [name], male   DOB: 1951/02/27, 62 y.o.   MRN: [id number]    No Known Allergies  Chief Complaint  Patient presents with  . Annual Exam    physical   . other    never had colonoscopy,  ? if he needs Pneumo vaccine    HPI: Patient is a 62 y.o. male seen in the office today for annul physical Reports he has been losing weight despite trying to eat more. Thinks he has good appetite but then questions this since he has ongoing weight loss.  Worsening back pain at times, takes oxycodone for this that is given to him by orthopedics  Has never had screening colonoscopy  Review of Systems:  Review of Systems  Constitutional: Positive for weight loss. Negative for fever, chills and malaise/fatigue.  Respiratory: Negative for cough and shortness of breath.   Cardiovascular: Negative for chest pain, palpitations and leg swelling.  Gastrointestinal: Negative for heartburn, nausea, abdominal pain, diarrhea, constipation and melena.  Genitourinary: Negative for dysuria, urgency and frequency.       Less freq since HD but feels like he has weak slow flow  Musculoskeletal: Positive for back pain. Negative for falls.  Skin: Negative.   Neurological: Negative for dizziness, weakness and headaches.  Psychiatric/Behavioral: Negative for depression and memory loss. The patient is not nervous/anxious.      Past Medical History  Diagnosis Date  . Renal disorder   . Hypertension   . CHF (congestive heart failure)   . Arthritis   . Chronic back pain    Past Surgical History  Procedure Laterality Date  . Av fistula placement Right 07/26/2009  . Ligation of arteriovenous  fistula Right 123456    Procedure: PLICATION OF ARTERIOVENOUS  FISTULA;  Surgeon: Elam Dutch, MD;  Location: [facility];  Service: Vascular;  Laterality: Right;  . Insertion of dialysis catheter N/A 02/22/2013    Procedure: INSERTION OF DIALYSIS CATHETER;  Surgeon: Elam Dutch, MD;  Location: [place];   Service: Vascular;  Laterality: N/A;   Social History:   reports that he has never smoked. He has never used smokeless tobacco. He reports that he does not drink alcohol or use illicit drugs.  Family History  Problem Relation Age of Onset  . Diabetes Father   . Hypertension Father   . Diabetes Sister     Medications: Patient's Medications  New Prescriptions   No medications on file  Previous Medications   ATENOLOL (TENORMIN) 100 MG TABLET    Take 100 mg by mouth daily. Take 1 and 1/2 tablet every day (to equal 150 mg QD)   CALCITRIOL (ROCALTROL) 0.25 MCG CAPSULE    Take 1 mcg by mouth 2 (two) times daily. Take 4 capsules by mouth once daily at the same time daily.   OXYCODONE-ACETAMINOPHEN (PERCOCET/ROXICET) 5-325 MG PER TABLET    Take 1 tablet by mouth every 4 (four) hours as needed for severe pain.  Modified Medications   No medications on file  Discontinued Medications   DIPTHERIA-TETANUS TOXOIDS (DECAVAC) 2-2 LF/0.5ML INJECTION    Inject 0.5 mLs into the muscle once.   OXYCODONE-ACETAMINOPHEN (PERCOCET/ROXICET) 5-325 MG PER TABLET    Take 1 tablet by mouth every 6 (six) hours as needed for pain.   PREDNISONE (DELTASONE) 5 MG TABLET    Take 5 mg by mouth daily.   TDAP (BOOSTRIX) 5-2.5-18.5 LF-MCG/0.5 INJECTION    Inject 0.5 mLs into the muscle once.   TRAMADOL Veatrice Bourbon)  50 MG TABLET    Take 50 mg by mouth 2 (two) times daily as needed for pain.     Physical Exam:  Filed Vitals:   04/07/13 1122  BP: 152/80  Pulse: 75  Temp: 97.9 F (36.6 C)  TempSrc: Oral  Resp: 14  Height: 6' 2.25" (1.886 m)  Weight: 168 lb 3.2 oz (76.295 kg)  SpO2: 98%    Physical Exam  Constitutional: He is oriented to person, place, and time. No distress.  Thin male in NAD  HENT:  Head: Normocephalic and atraumatic.  Right Ear: External ear normal.  Left Ear: External ear normal.  Nose: Nose normal.  Mouth/Throat: Oropharynx is clear and moist. No oropharyngeal exudate.  Eyes: Conjunctivae  and EOM are normal. Pupils are equal, round, and reactive to light.  Neck: Normal range of motion. Neck supple. No thyromegaly present.  Cardiovascular: Normal rate and regular rhythm.   Audible bruit throughout chest and into neck  Pulmonary/Chest: Effort normal and breath sounds normal.  Abdominal: Soft. Bowel sounds are normal. He exhibits no distension and no mass. There is no tenderness. There is no guarding.  Genitourinary: Rectum normal and penis normal. Guaiac stool: unable to obtain specium   Musculoskeletal: Normal range of motion. He exhibits no edema and no tenderness.  Lymphadenopathy:    He has no cervical adenopathy.  Neurological: He is alert and oriented to person, place, and time.  Skin: Skin is warm and dry. He is not diaphoretic.  Psychiatric: Affect normal.  ;  Labs reviewed: Basic Metabolic Panel:  Recent Labs  12/18/12 0500 12/19/12 0442 12/20/12 0652 02/22/13 0823 02/24/13 1019  NA 139 139 139 137 139  K 4.2 4.7 4.3 3.3* 3.9  CL 104 104 105  --  96*  CO2 25 25 25   --  28  GLUCOSE 87 92 91 87 78  BUN 51* 57* 62*  --  15  CREATININE 5.23* 5.73* 5.96*  --  3.91*  CALCIUM 7.6* 7.8* 7.8*  --  8.6  PHOS 5.2* 5.5* 5.5*  --   --    Liver Function Tests:  Recent Labs  12/11/12 1318  12/18/12 0500 12/19/12 0442 12/20/12 0652 02/24/13 1019  AST 19  --   --   --   --  16  ALT 17  --   --   --   --  9  ALKPHOS 102  --   --   --   --  81  BILITOT 0.6  --   --   --   --  0.6  PROT 5.2*  --   --   --   --  5.7*  ALBUMIN 2.8*  < > 2.4* 2.3* 2.3*  --   < > = values in this interval not displayed. No results found for this basename: LIPASE, AMYLASE,  in the last 8760 hours No results found for this basename: AMMONIA,  in the last 8760 hours CBC:  Recent Labs  12/17/12 0906 12/19/12 0442 12/20/12 0652 02/22/13 0821 02/22/13 0823  WBC 4.4 6.0 5.7  --   --   HGB 9.3* 8.2* 8.0*  --  13.6  HCT 27.5* 25.5* 23.7*  --  40.0  MCV 89.3 91.4 89.4  --   --     PLT 73* 74* 87* 125*  --    Lipid Panel:  Recent Labs  02/24/13 1019  HDL 60  LDLCALC 76  TRIG 51  CHOLHDL 2.4   TSH: No results  found for this basename: TSH,  in the last 8760 hours A1C: No components found with this basename: A1C,    Assessment/Plan 1. Essential hypertension, benign -Patient is stable; continue current regimen. Will monitor and make changes as necessary.  2. ESRD on dialysis right upper extremity AV fistula; incision has healed easily palpable thrill audible bruit in fistula right; conts with dialysis MWF   3. Anemia in chronic kidney disease(285.21) -will follow up cbc  4. Back pain -followed by ortho who manages his pain medications  5. Weight loss, unintentional -60 lb weight loss since aug 2014 when he started HD - Ambulatory referral to Gastroenterology for screening colonoscopy which he is over due for and given the weight loss is needed -recent chest xray done for HD cathether without findings to indicate reason weight loss - CBC With differential/Platelet - Urinalysis - Urine Microscopic Only - Comprehensive metabolic panel - TSH - HIV antibody - PSA -pt reports he has a good appetite but "maybe it could be better" Will order mirtazapine (REMERON) 15 MG tablet; Take 0.5 tablets (7.5 mg total) by mouth at bedtime.  Dispense: 30 tablet; Refill: 1 for appetite stimulate -pt conts to try to eat meals high in protein and snacks through the day  6. Special screening for malignant neoplasms, colon - Ambulatory referral to Gastroenterology  7. Screening PSA (prostate specific antigen) -for annual physical and given weight loss  PREVENTIVE COUNSELING:  The patient was counseled regarding the appropriate use of alcohol, regular self-examination of the breasts on a monthly basis, prevention of dental and periodontal disease, diet, regular sustained exercise for at least 30 minutes 3-4 times per week, testicular self-examination on a monthly basis  and recommended schedule for GI hemoccult testing, colonoscopy, cholesterol, thyroid and diabetes screening.  45 mins Time TOTAL:  time greater than 50% of total time spent doing pt counseled and coordination of care regarding medication management, preventive care, disease process and chronic conditions   Follow up in 1 month with Dr Bubba Camp

## 2013-04-07 NOTE — Patient Instructions (Signed)
Will get blood work today to help explain weight loss -you need colonoscopy- this has been ordered -will start you on Remeron 7.5 mg at night to help stimulate appetite   -follow up in 1 month with Bubba Camp, MD

## 2013-04-08 ENCOUNTER — Encounter: Payer: Self-pay | Admitting: Gastroenterology

## 2013-04-08 LAB — COMPREHENSIVE METABOLIC PANEL
AST: 20 IU/L (ref 0–40)
Albumin/Globulin Ratio: 1.6 (ref 1.1–2.5)
Albumin: 3.9 g/dL (ref 3.6–4.8)
BUN: 25 mg/dL (ref 8–27)
CO2: 32 mmol/L — ABNORMAL HIGH (ref 18–29)
Calcium: 9.4 mg/dL (ref 8.6–10.2)
Creatinine, Ser: 4.72 mg/dL — ABNORMAL HIGH (ref 0.76–1.27)
GFR calc Af Amer: 14 mL/min/{1.73_m2} — ABNORMAL LOW (ref >59)
GFR calc non Af Amer: 12 mL/min/{1.73_m2} — ABNORMAL LOW (ref >59)
Globulin, Total: 2.5 g/dL (ref 1.5–4.5)
Glucose: 83 mg/dL (ref 65–99)
Potassium: 5.1 mmol/L (ref 3.5–5.2)
Sodium: 143 mmol/L (ref 134–144)
Total Bilirubin: 0.4 mg/dL (ref 0.0–1.2)
Total Protein: 6.4 g/dL (ref 6.0–8.5)

## 2013-04-08 LAB — CBC WITH DIFFERENTIAL
Basos: 1 %
Eos: 1 %
Eosinophils Absolute: 0.1 10*3/uL (ref 0.0–0.4)
HCT: 41.9 % (ref 37.5–51.0)
Hemoglobin: 13.6 g/dL (ref 12.6–17.7)
Immature Grans (Abs): 0 10*3/uL (ref 0.0–0.1)
Lymphs: 17 %
MCHC: 32.5 g/dL (ref 31.5–35.7)
MCV: 94 fL (ref 79–97)
Neutrophils Absolute: 3.3 10*3/uL (ref 1.4–7.0)
Neutrophils Relative %: 68 %
RBC: 4.47 x10E6/uL (ref 4.14–5.80)

## 2013-04-08 LAB — HIV ANTIBODY (ROUTINE TESTING W REFLEX)
HIV 1/O/2 Abs-Index Value: <1 (ref <1.00)
HIV-1/HIV-2 Ab: NONREACTIVE

## 2013-04-08 LAB — PSA: PSA: 1.5 ng/mL (ref 0.0–4.0)

## 2013-04-14 NOTE — Progress Notes (Signed)
Patient ID: Gregory [name], male   DOB: March 27, 1951, 62 y.o.   MRN: [id number]     [facility]  No Known Allergies  Chief Complaint  Patient presents with  . Discharge Note    HPI He is being discharged to home with home health for pt/ot/cna/nursing. Will need a wheelchair in order for him to maintain his level of independence with his adl's which cannot be achieved with a walker. He will need a bsc and will need prescriptions to be written.   Past Medical History  Diagnosis Date  . Renal disorder   . Hypertension   . CHF (congestive heart failure)   . Arthritis   . Chronic back pain     Past Surgical History  Procedure Laterality Date  . Av fistula placement Right 07/26/2009   Filed Vitals:   01/11/13 1518  BP: 100/60  Pulse: 90  Height: 6\' 3"  (1.905 m)  Weight: 235 lb (106.595 kg)    MEDICATIONS Atenolol 150 mg daily Prednisone 5 mg daily renavite phoslo 667 2 tabs tid 1200 cc fluid restriction Percocet 7.5/325 mg bid prn   LABS REVIEWED:   12-29-12: wbc 4.8;hgb 8.1; hct 25.6; mcv 95 plt 88; glucose 75 ;bun 22; creat 3.68; k3.3; na++ 144  Review of Systems  Constitutional: Negative for malaise/fatigue.  Eyes: Negative for blurred vision.  Respiratory: Negative for cough and shortness of breath.   Cardiovascular: Negative for chest pain, palpitations and leg swelling.  Gastrointestinal: Negative for heartburn and abdominal pain.  Musculoskeletal: Negative for joint pain and myalgias.  Skin: Negative.   Neurological: Negative for dizziness, weakness and headaches.  Psychiatric/Behavioral: The patient is not nervous/anxious.      Physical Exam  Constitutional: He is oriented to person, place, and time. He appears well-developed and well-nourished. No distress.  obese  Neck: Neck supple. No JVD present.  Cardiovascular: Normal rate, regular rhythm and intact distal pulses.   Respiratory: Effort normal and breath sounds normal. No respiratory distress.    GI: Soft. Bowel sounds are normal. He exhibits no distension. There is no tenderness.  Musculoskeletal: Normal range of motion. He exhibits no edema.  Neurological: He is alert and oriented to person, place, and time.  Skin: Skin is warm and dry. He is not diaphoretic.  Psychiatric: He has a normal mood and affect.     ASSESSMENT/PLAN  Will discharge to home with home health for pt/ot/nursing will need a wheelchair and bsc. Prescriptions written.   Time spent with patient 40 minutes.

## 2013-04-15 ENCOUNTER — Ambulatory Visit: Payer: Medicare Other | Admitting: Gastroenterology

## 2013-05-04 DIAGNOSIS — N186 End stage renal disease: Secondary | ICD-10-CM | POA: Diagnosis not present

## 2013-05-06 DIAGNOSIS — D631 Anemia in chronic kidney disease: Secondary | ICD-10-CM | POA: Diagnosis not present

## 2013-05-06 DIAGNOSIS — N186 End stage renal disease: Secondary | ICD-10-CM | POA: Diagnosis not present

## 2013-05-06 DIAGNOSIS — N2581 Secondary hyperparathyroidism of renal origin: Secondary | ICD-10-CM | POA: Diagnosis not present

## 2013-05-06 DIAGNOSIS — D509 Iron deficiency anemia, unspecified: Secondary | ICD-10-CM | POA: Diagnosis not present

## 2013-05-10 ENCOUNTER — Encounter: Payer: Self-pay | Admitting: Internal Medicine

## 2013-05-10 ENCOUNTER — Ambulatory Visit (INDEPENDENT_AMBULATORY_CARE_PROVIDER_SITE_OTHER): Payer: Medicare Other | Admitting: Internal Medicine

## 2013-05-10 VITALS — BP 140/92 | HR 53 | Resp 12 | Wt 156.0 lb

## 2013-05-10 DIAGNOSIS — N186 End stage renal disease: Secondary | ICD-10-CM

## 2013-05-10 DIAGNOSIS — I10 Essential (primary) hypertension: Secondary | ICD-10-CM

## 2013-05-10 DIAGNOSIS — Z992 Dependence on renal dialysis: Secondary | ICD-10-CM

## 2013-05-10 DIAGNOSIS — M5137 Other intervertebral disc degeneration, lumbosacral region: Secondary | ICD-10-CM | POA: Diagnosis not present

## 2013-05-10 DIAGNOSIS — M109 Gout, unspecified: Secondary | ICD-10-CM

## 2013-05-10 DIAGNOSIS — R634 Abnormal weight loss: Secondary | ICD-10-CM | POA: Diagnosis not present

## 2013-05-10 MED ORDER — PREDNISONE 50 MG PO TABS: ORAL_TABLET | ORAL | Status: DC

## 2013-05-10 NOTE — Patient Instructions (Signed)
GI appointment on 06/07/12 at 9 am with Dr Carlean Purl at [facility]

## 2013-05-10 NOTE — Progress Notes (Signed)
Patient ID: Gregory [name], male   DOB: 1950-09-05, 63 y.o.   MRN: [id number]     Chief Complaint  Patient presents with  . Medical Managment of Chronic Issues    1 month follow-up   . Hand Problem    Right Hand (thumb and pointer finger swelling)-? gout or arthritis     No Known Allergies  HPI 63 y/o male patient is here for follow up.  His blood pressure is slightly elevated here. He mentions it is been normal at home and was in a hurry and little flustered on his way to the appointment today His back pain is under control with current pain regimen He has been having pain with swelling in his right thumb and index finger for few days it is swollen and flexion ilicits pain. No drainage. No known hx of arthritis and gout He has lost 12 lbs over one month. He mentions having an appetite. He is on dialysis 3 days a week. He has not taken remeron after reading its side effects. He mentions losing weight slowly over 5-6 months after starting dialysis Reviewed his labs  Review of Systems  Constitutional: Positive for weight loss. Negative for fever, chills and malaise/fatigue.  Respiratory: Negative for cough and shortness of breath.   Cardiovascular: Negative for chest pain, palpitations and leg swelling.  Gastrointestinal: Negative for heartburn, nausea, vomiting, abdominal pain, diarrhea, constipation and melena.  Genitourinary: Negative for dysuria, urgency and frequency.  decreased urine formation since staring of dialysis Musculoskeletal: Positive for back pain. Negative for falls.  Skin: Negative for rash, sores  Neurological: Negative for dizziness, weakness and headaches.  Psychiatric/Behavioral: Negative for depression and memory loss. The patient is not nervous/anxious.    Past Medical History  Diagnosis Date  . Renal disorder   . Hypertension   . CHF (congestive heart failure)   . Arthritis   . Chronic back pain    Past Surgical History  Procedure Laterality Date  . Av  fistula placement Right 07/26/2009  . Ligation of arteriovenous  fistula Right 123456    Procedure: PLICATION OF ARTERIOVENOUS  FISTULA;  Surgeon: Elam Dutch, MD;  Location: [facility] [facility];  Service: Vascular;  Laterality: Right;  . Insertion of dialysis catheter N/A 02/22/2013    Procedure: INSERTION OF DIALYSIS CATHETER;  Surgeon: Elam Dutch, MD;  Location: [place];  Service: Vascular;  Laterality: N/A;   Medication reviewed. See [facility]  Physical exam BP 140/92  Pulse 53  Resp 12  Wt 156 lb (70.761 kg)  SpO2 99%  Constitutional: He is oriented to person, place, and time. No distress. Thin built HENT:   Head: Normocephalic and atraumatic.  Nose: Nose normal.   Mouth/Throat: Oropharynx is clear and moist. No oropharyngeal exudate.  Eyes: Conjunctivae and EOM are normal. Pupils are equal, round, and reactive to light.  Neck: Normal range of motion. Neck supple. No thyromegaly present.  Cardiovascular: Normal rate and regular rhythm.   Pulmonary/Chest: Effort normal and breath sounds normal.  Abdominal: Soft. Bowel sounds are normal. He exhibits no distension and no mass. There is no tenderness. There is no guarding.  Musculoskeletal: Normal range of motion. He exhibits no edema and no tenderness.  Lymphadenopathy:    He has no cervical adenopathy.  Neurological: He is alert and oriented to person, place, and time.  Skin: Skin is warm and dry. He is not diaphoretic.  Psychiatric: Affect normal.   Labs- CBC    Component Value Date/Time  WBC 4.8 04/07/2013 1230   WBC 5.7 12/20/2012 0652   RBC 4.47 04/07/2013 1230   RBC 2.65* 12/20/2012 0652   HGB 13.6 04/07/2013 1230   HCT 41.9 04/07/2013 1230   PLT 126* 04/07/2013 1230   MCV 94 04/07/2013 1230   MCH 30.4 04/07/2013 1230   MCH 30.2 12/20/2012 0652   MCHC 32.5 04/07/2013 1230   MCHC 33.8 12/20/2012 0652   RDW 15.7* 04/07/2013 1230   RDW 17.5* 12/20/2012 0652   LYMPHSABS 0.8 04/07/2013 1230   EOSABS 0.1 04/07/2013 1230   BASOSABS 0.0  04/07/2013 1230    CMP     Component Value Date/Time   NA 143 04/07/2013 1230   NA 137 02/22/2013 0823   K 5.1 04/07/2013 1230   CL 97 04/07/2013 1230   CO2 32* 04/07/2013 1230   GLUCOSE 83 04/07/2013 1230   GLUCOSE 87 02/22/2013 0823   BUN 25 04/07/2013 1230   BUN 62* 12/20/2012 0652   CREATININE 4.72* 04/07/2013 1230   CALCIUM 9.4 04/07/2013 1230   PROT 6.4 04/07/2013 1230   PROT 5.2* 12/11/2012 1318   ALBUMIN 2.3* 12/20/2012 0652   AST 20 04/07/2013 1230   ALT 11 04/07/2013 1230   ALKPHOS 88 04/07/2013 1230   BILITOT 0.4 04/07/2013 1230   GFRNONAA 12* 04/07/2013 1230   GFRAA 14* 04/07/2013 1230   Normal PSA, TSH  HIV non reactive   Assessment/plan  1. Acute gouty arthritis With acute onset and involvement of DIP and PIP concerns for gout attack. Given his poor renal function and being on dialysis, will avoid colchicine. Will provide prednisone 50 mg daily x 5 days, check uric acid level next week and have him on chronic gout med if indicated - Uric Acid; Future  2. Loss of weight Reviewed available labs. normal exam. No hx of smoking. Scheduled GI consultation on  06/07/12 with dr Carlean Purl for colonoscopy to rule out mass/ tumor  3. Essential hypertension, benign Continue current dose of atenolol.monitor clinically  4. ESRD on dialysis Continue diaysis 3 days a week. Cathter in chest area clean.

## 2013-05-31 DIAGNOSIS — T82898A Other specified complication of vascular prosthetic devices, implants and grafts, initial encounter: Secondary | ICD-10-CM | POA: Diagnosis not present

## 2013-05-31 DIAGNOSIS — I871 Compression of vein: Secondary | ICD-10-CM | POA: Diagnosis not present

## 2013-05-31 DIAGNOSIS — N186 End stage renal disease: Secondary | ICD-10-CM | POA: Diagnosis not present

## 2013-06-01 DIAGNOSIS — N186 End stage renal disease: Secondary | ICD-10-CM | POA: Diagnosis not present

## 2013-06-04 DIAGNOSIS — N186 End stage renal disease: Secondary | ICD-10-CM | POA: Diagnosis not present

## 2013-06-06 DIAGNOSIS — N2581 Secondary hyperparathyroidism of renal origin: Secondary | ICD-10-CM | POA: Diagnosis not present

## 2013-06-06 DIAGNOSIS — D509 Iron deficiency anemia, unspecified: Secondary | ICD-10-CM | POA: Diagnosis not present

## 2013-06-06 DIAGNOSIS — D631 Anemia in chronic kidney disease: Secondary | ICD-10-CM | POA: Diagnosis not present

## 2013-06-06 DIAGNOSIS — N186 End stage renal disease: Secondary | ICD-10-CM | POA: Diagnosis not present

## 2013-06-07 ENCOUNTER — Ambulatory Visit: Payer: Medicare Other | Admitting: Internal Medicine

## 2013-06-07 ENCOUNTER — Telehealth: Payer: Self-pay | Admitting: *Deleted

## 2013-06-07 DIAGNOSIS — M545 Low back pain, unspecified: Secondary | ICD-10-CM | POA: Diagnosis not present

## 2013-06-07 DIAGNOSIS — G894 Chronic pain syndrome: Secondary | ICD-10-CM | POA: Diagnosis not present

## 2013-06-07 DIAGNOSIS — M5137 Other intervertebral disc degeneration, lumbosacral region: Secondary | ICD-10-CM | POA: Diagnosis not present

## 2013-06-07 NOTE — Telephone Encounter (Signed)
Spoke to pt's wife and he had an appt with his back doctor this morning at the same time. Gave her the number for [facility] [phone] to call and reschedule his appt.

## 2013-07-02 DIAGNOSIS — N186 End stage renal disease: Secondary | ICD-10-CM | POA: Diagnosis not present

## 2013-07-04 DIAGNOSIS — N186 End stage renal disease: Secondary | ICD-10-CM | POA: Diagnosis not present

## 2013-07-04 DIAGNOSIS — D509 Iron deficiency anemia, unspecified: Secondary | ICD-10-CM | POA: Diagnosis not present

## 2013-07-04 DIAGNOSIS — D631 Anemia in chronic kidney disease: Secondary | ICD-10-CM | POA: Diagnosis not present

## 2013-07-04 DIAGNOSIS — N2581 Secondary hyperparathyroidism of renal origin: Secondary | ICD-10-CM | POA: Diagnosis not present

## 2013-07-07 ENCOUNTER — Ambulatory Visit (INDEPENDENT_AMBULATORY_CARE_PROVIDER_SITE_OTHER): Payer: Medicare Other | Admitting: Internal Medicine

## 2013-07-07 ENCOUNTER — Encounter: Payer: Self-pay | Admitting: Internal Medicine

## 2013-07-07 VITALS — BP 120/78 | HR 76 | Ht 74.25 in | Wt 167.6 lb

## 2013-07-07 DIAGNOSIS — Z992 Dependence on renal dialysis: Secondary | ICD-10-CM

## 2013-07-07 DIAGNOSIS — N186 End stage renal disease: Secondary | ICD-10-CM

## 2013-07-07 DIAGNOSIS — R634 Abnormal weight loss: Secondary | ICD-10-CM

## 2013-07-07 DIAGNOSIS — Z1211 Encounter for screening for malignant neoplasm of colon: Secondary | ICD-10-CM | POA: Diagnosis not present

## 2013-07-07 NOTE — Patient Instructions (Signed)
You have signed a form for [employer]. They will contact you and sent you out the necessary containers to your home address. We will contact you with the results

## 2013-07-07 NOTE — Progress Notes (Signed)
Patient ID: NOAL KRATZER, male   DOB: [birth year], 63 y.o.   MRN: [id number] HPI: Mr. Dahle is a 63 year old male with a past medical history of ESRD on dialysis Monday Wednesday Friday, hypertension, CHF, anemia of chronic disease who is seen in consultation at the request of Hassell Done, NP for consideration of screening colonoscopy in the setting of weight loss.  He is here today with his wife.  He started dialysis in the summer of 2014 NSAIDs this time he has lost approximately 50-60 pounds. He denies GI complaints today. He reports he is eating well. He denies nausea or vomiting. He denies heartburn, dysphagia or odynophagia. No abdominal pain. Reports bowel movements are normal and regular for him. He denies diarrhea or constipation. No blood in his stool or melena. He has never had a screening colonoscopy. Denies family history of colorectal cancer.  He has been completing his dialysis sessions lately and reaching his dry weight  Past Medical History  Diagnosis Date  . End stage renal disease   . Hypertension   . CHF (congestive heart failure)   . Arthritis     gouty  . Chronic back pain   . Heart failure with reduced ejection fraction   . Anemia in chronic kidney disease     Past Surgical History  Procedure Laterality Date  . Av fistula placement Right 07/26/2009  . Ligation of arteriovenous  fistula Right 123456    Procedure: PLICATION OF ARTERIOVENOUS  FISTULA;  Surgeon: Elam Dutch, MD;  Location: [facility];  Service: Vascular;  Laterality: Right;  . Insertion of dialysis catheter N/A 02/22/2013    Procedure: INSERTION OF DIALYSIS CATHETER;  Surgeon: Elam Dutch, MD;  Location: [place];  Service: Vascular;  Laterality: N/A;    Current Outpatient Prescriptions  Medication Sig Dispense Refill  . atenolol (TENORMIN) 100 MG tablet Take 100 mg by mouth daily. Take 1 and 1/2 tablet every day (to equal 150 mg QD)      . calcitRIOL (ROCALTROL) 0.25 MCG capsule Take 1 mcg  by mouth 2 (two) times daily. Take 4 capsules by mouth once daily at the same time daily.      Marland Kitchen oxyCODONE-acetaminophen (PERCOCET/ROXICET) 5-325 MG per tablet Take 1 tablet by mouth every 4 (four) hours as needed for severe pain.  40 tablet  0  . predniSONE (DELTASONE) 50 MG tablet Take one tablet once a day for 5 days for the inflammation in your fingers  5 tablet  0   No current facility-administered medications for this visit.    No Known Allergies  Family History  Problem Relation Age of Onset  . Diabetes Father   . Hypertension Father   . Diabetes Sister     History  Substance Use Topics  . Smoking status: Never Smoker   . Smokeless tobacco: Never Used  . Alcohol Use: No    ROS: As per history of present illness, otherwise negative  BP 120/78  Pulse 76  Ht 6' 2.25" (1.886 m)  Wt 167 lb 9.6 oz (76.023 kg)  BMI 21.37 kg/m2 Constitutional: Well-developed and well-nourished. No distress. HEENT: Normocephalic and atraumatic.  No scleral icterus. Neck: Neck supple. Trachea midline. Cardiovascular: Normal rate, regular rhythm and intact distal pulses. Pulmonary/chest: Effort normal and breath sounds normal. No wheezing, rales or rhonchi. Abdominal: Soft, nontender, nondistended. Bowel sounds active throughout.  Extremities: no clubbing, cyanosis, or edema Neurological: Alert and oriented to person place and time. Skin: Skin is warm and dry.  No rashes noted. Psychiatric: Normal mood and affect. Behavior is normal.  RELEVANT LABS AND IMAGING: CBC    Component Value Date/Time   WBC 4.8 04/07/2013 1230   WBC 5.7 12/20/2012 0652   RBC 4.47 04/07/2013 1230   RBC 2.65* 12/20/2012 0652   HGB 13.6 04/07/2013 1230   HCT 41.9 04/07/2013 1230   PLT 126* 04/07/2013 1230   MCV 94 04/07/2013 1230   MCH 30.4 04/07/2013 1230   MCH 30.2 12/20/2012 0652   MCHC 32.5 04/07/2013 1230   MCHC 33.8 12/20/2012 0652   RDW 15.7* 04/07/2013 1230   RDW 17.5* 12/20/2012 0652   LYMPHSABS 0.8 04/07/2013  1230   EOSABS 0.1 04/07/2013 1230   BASOSABS 0.0 04/07/2013 1230    CMP     Component Value Date/Time   NA 143 04/07/2013 1230   NA 137 02/22/2013 0823   K 5.1 04/07/2013 1230   CL 97 04/07/2013 1230   CO2 32* 04/07/2013 1230   GLUCOSE 83 04/07/2013 1230   GLUCOSE 87 02/22/2013 0823   BUN 25 04/07/2013 1230   BUN 62* 12/20/2012 0652   CREATININE 4.72* 04/07/2013 1230   CALCIUM 9.4 04/07/2013 1230   PROT 6.4 04/07/2013 1230   PROT 5.2* 12/11/2012 1318   ALBUMIN 2.3* 12/20/2012 0652   AST 20 04/07/2013 1230   ALT 11 04/07/2013 1230   ALKPHOS 88 04/07/2013 1230   BILITOT 0.4 04/07/2013 1230   GFRNONAA 12* 04/07/2013 1230   GFRAA 14* 04/07/2013 1230    ASSESSMENT/PLAN: 63 year old male with a past medical history of ESRD on dialysis Monday Wednesday Friday, hypertension, CHF, anemia of chronic disease who is seen in consultation at the request of Hassell Done, NP for consideration of screening colonoscopy in the setting of weight loss.  1.  CRC screening/weight loss -- he does not have GI alarm symptoms at present. He has never had a screening colonoscopy. His weight loss has been somewhat gradual since starting dialysis, though he states over the last month his weight has been very stable. There are no localizing GI symptoms. I recommended colonoscopy for screening, and we discussed the test including the risks and benefits at length. He is worried about taking the bowel preparation as it relates to his dialysis days. He asked for other options and we discussed barium enema, CT colonoscopy, and Cologuard.  He understands that with any other screening modality, a positive test would lead to a colonoscopy. After our discussion he prefers to proceed with Cologuard.  This test was ordered for him, will be mailed to his home and once I receive results we'll notify him. If this test is negative I would recommend repeating it every 3 years, unless guidelines change.

## 2013-08-02 DIAGNOSIS — M5137 Other intervertebral disc degeneration, lumbosacral region: Secondary | ICD-10-CM | POA: Diagnosis not present

## 2013-08-02 DIAGNOSIS — N186 End stage renal disease: Secondary | ICD-10-CM | POA: Diagnosis not present

## 2013-08-03 DIAGNOSIS — N186 End stage renal disease: Secondary | ICD-10-CM | POA: Diagnosis not present

## 2013-08-03 DIAGNOSIS — N2581 Secondary hyperparathyroidism of renal origin: Secondary | ICD-10-CM | POA: Diagnosis not present

## 2013-08-11 ENCOUNTER — Ambulatory Visit: Payer: Medicare Other | Admitting: Nurse Practitioner

## 2013-08-17 ENCOUNTER — Ambulatory Visit: Payer: Medicare Other | Admitting: Nurse Practitioner

## 2013-08-25 ENCOUNTER — Ambulatory Visit (INDEPENDENT_AMBULATORY_CARE_PROVIDER_SITE_OTHER): Payer: Medicare Other | Admitting: Nurse Practitioner

## 2013-08-25 ENCOUNTER — Encounter: Payer: Self-pay | Admitting: Nurse Practitioner

## 2013-08-25 VITALS — BP 140/84 | HR 56 | Temp 97.9°F | Wt 173.4 lb

## 2013-08-25 DIAGNOSIS — L989 Disorder of the skin and subcutaneous tissue, unspecified: Secondary | ICD-10-CM | POA: Diagnosis not present

## 2013-08-25 DIAGNOSIS — I10 Essential (primary) hypertension: Secondary | ICD-10-CM

## 2013-08-25 DIAGNOSIS — Z992 Dependence on renal dialysis: Secondary | ICD-10-CM

## 2013-08-25 DIAGNOSIS — M109 Gout, unspecified: Secondary | ICD-10-CM | POA: Insufficient documentation

## 2013-08-25 DIAGNOSIS — N186 End stage renal disease: Secondary | ICD-10-CM

## 2013-08-25 MED ORDER — PREDNISONE 50 MG PO TABS: ORAL_TABLET | ORAL | Status: DC

## 2013-08-25 NOTE — Progress Notes (Signed)
Patient ID: ROBERTJOHN [name], male   DOB: 1951/03/21, 63 y.o.   MRN: [id number]    No Known Allergies  Chief Complaint  Patient presents with  . Medical Management of Chronic Issues    3 month f/u with no recent labs  . Immunizations    declines shingles & pneumo vaccines  . Other    wants a referral for a mole on his face, and never had a colonoscopy    HPI: Patient is a 63 y.o. male seen in the office today for routine follow up; reports he is doing well other than gout on his left index finger, was treated with prednisone 3 months ago with good results. Did not come in for follow up lab because he was unaware Had GI bug over the weekend- back 100% now Pain medication adequately controlling back pain- following with orthopedic doctor who prescribes medication.  Went to GI and plans to do cologuard for screening. Would like dermatology referral due to moles on face Review of Systems:  Review of Systems  Constitutional: Negative for fever, chills, weight loss and malaise/fatigue.  Respiratory: Negative for cough and shortness of breath.   Cardiovascular: Negative for chest pain, palpitations and leg swelling.  Gastrointestinal: Negative for heartburn, nausea, vomiting, abdominal pain, diarrhea, constipation and melena.  Genitourinary: Negative for dysuria, urgency and frequency.       HD on  MWF  Musculoskeletal: Positive for back pain (well controlled with medicaiton) and joint pain (to left index finger). Negative for falls.  Skin: Negative.   Neurological: Negative for dizziness, weakness and headaches.  Psychiatric/Behavioral: Negative for depression and memory loss. The patient is not nervous/anxious.      Past Medical History  Diagnosis Date  . End stage renal disease   . Hypertension   . CHF (congestive heart failure)   . Arthritis     gouty  . Chronic back pain   . Heart failure with reduced ejection fraction   . Anemia in chronic kidney disease    Past Surgical  History  Procedure Laterality Date  . Av fistula placement Right 07/26/2009  . Ligation of arteriovenous  fistula Right 123456    Procedure: PLICATION OF ARTERIOVENOUS  FISTULA;  Surgeon: Elam Dutch, MD;  Location: [facility];  Service: Vascular;  Laterality: Right;  . Insertion of dialysis catheter N/A 02/22/2013    Procedure: INSERTION OF DIALYSIS CATHETER;  Surgeon: Elam Dutch, MD;  Location: [facility];  Service: Vascular;  Laterality: N/A;   Social History:   reports that he has never smoked. He has never used smokeless tobacco. He reports that he does not drink alcohol or use illicit drugs.  Family History  Problem Relation Age of Onset  . Diabetes Father   . Hypertension Father   . Diabetes Sister     Medications: Patient's Medications  New Prescriptions   No medications on file  Previous Medications   ATENOLOL (TENORMIN) 100 MG TABLET    Take 100 mg by mouth daily. Take 1 and 1/2 tablet every day (to equal 150 mg QD)   CALCITRIOL (ROCALTROL) 0.25 MCG CAPSULE    Take 1 mcg by mouth 2 (two) times daily. Take 4 capsules by mouth once daily at the same time daily.   OXYCODONE-ACETAMINOPHEN (PERCOCET/ROXICET) 5-325 MG PER TABLET    Take 1 tablet by mouth every 4 (four) hours as needed for severe pain.  Modified Medications   No medications on file  Discontinued Medications   PREDNISONE (  DELTASONE) 50 MG TABLET    Take one tablet once a day for 5 days for the inflammation in your fingers     Physical Exam:  Filed Vitals:   08/25/13 0822  BP: 140/84  Pulse: 56  Temp: 97.9 F (36.6 C)  TempSrc: Oral  Weight: 173 lb 6.4 oz (78.654 kg)  SpO2: 99%   Physical Exam  Constitutional: He is oriented to person, place, and time and well-developed, well-nourished, and in no distress.  HENT:  Head: Normocephalic and atraumatic.  Nose: Nose normal.  Mouth/Throat: Oropharynx is clear and moist. No oropharyngeal exudate.  Eyes: Conjunctivae and EOM are normal. Pupils are  equal, round, and reactive to light.  Neck: Normal range of motion. Neck supple.  Cardiovascular: Normal rate and regular rhythm.   Audible bruit throughout chest and into neck  Pulmonary/Chest: Effort normal and breath sounds normal.  Abdominal: Soft. Bowel sounds are normal. He exhibits no distension. There is no tenderness.  Musculoskeletal: Normal range of motion. He exhibits tenderness. He exhibits no edema.  tenderness of DIP and PIP joints to left index finger-- concerns for gout attack  Neurological: He is alert and oriented to person, place, and time.  Skin: Skin is warm and dry. He is not diaphoretic.  Psychiatric: Affect normal.     Labs reviewed: Basic Metabolic Panel:  Recent Labs  12/18/12 0500 12/19/12 0442 12/20/12 0652 02/22/13 0823 02/24/13 1019 04/07/13 1230  NA 139 139 139 137 139 143  K 4.2 4.7 4.3 3.3* 3.9 5.1  CL 104 104 105  --  96* 97  CO2 25 25 25   --  28 32*  GLUCOSE 87 92 91 87 78 83  BUN 51* 57* 62*  --  15 25  CREATININE 5.23* 5.73* 5.96*  --  3.91* 4.72*  CALCIUM 7.6* 7.8* 7.8*  --  8.6 9.4  PHOS 5.2* 5.5* 5.5*  --   --   --   TSH  --   --   --   --   --  1.260   Liver Function Tests:  Recent Labs  12/11/12 1318  12/18/12 0500 12/19/12 0442 12/20/12 0652 02/24/13 1019 04/07/13 1230  AST 19  --   --   --   --  16 20  ALT 17  --   --   --   --  9 11  ALKPHOS 102  --   --   --   --  81 88  BILITOT 0.6  --   --   --   --  0.6 0.4  PROT 5.2*  --   --   --   --  5.7* 6.4  ALBUMIN 2.8*  < > 2.4* 2.3* 2.3*  --   --   < > = values in this interval not displayed. No results found for this basename: LIPASE, AMYLASE,  in the last 8760 hours No results found for this basename: AMMONIA,  in the last 8760 hours CBC:  Recent Labs  12/19/12 0442 12/20/12 0652 02/22/13 0821 02/22/13 0823 04/07/13 1230  WBC 6.0 5.7  --   --  4.8  NEUTROABS  --   --   --   --  3.3  HGB 8.2* 8.0*  --  13.6 13.6  HCT 25.5* 23.7*  --  40.0 41.9  MCV 91.4  89.4  --   --  94  PLT 74* 87* 125*  --  126*   Lipid Panel:  Recent Labs  02/24/13  1019  HDL 60  LDLCALC 76  TRIG 51  CHOLHDL 2.4   TSH:  Recent Labs  04/07/13 1230  TSH 1.260    Assessment/Plan 1. Essential hypertension, benign -controlled on atenolol  2. ESRD on dialysis -right upper extremity AV fistula with +trill and bruit, conts dialysis MWF  3. Gout -without another acute episodes-- Will provide prednisone 50 mg daily x 5 days,  - instructions given to follow up blood work in 1 week  Uric Acid; Future - predniSONE (DELTASONE) 50 MG tablet; Take one tablet once a day for 5 days for the inflammation in your fingers  Dispense: 5 tablet; Refill: 0  4. Skin lesion of face -appears to be seborrheic keratosis but patient would like them removed because they are on his face and bothers him with his glasses - Ambulatory referral to Dermatology

## 2013-08-25 NOTE — Patient Instructions (Signed)
dont forget to do cologuard  Will get referral for dermatology for moles on face Will give prescription for prednisone for GOUT--- please make appt for lab after you have completed treatment  Follow up in 6 months

## 2013-08-29 ENCOUNTER — Telehealth: Payer: Self-pay

## 2013-08-29 NOTE — Telephone Encounter (Signed)
Patient called to make appointment for Uric Acid after finishing Prednisone, coming in tomorrow. Janett Billow had told him she was going to write Rx for him to take for his gout daily. There isn't anything in Jessica's note, will leave a message to Elmer. He would like to pick it up tomorrow when he comes in for lab work.

## 2013-08-30 ENCOUNTER — Other Ambulatory Visit: Payer: Medicare Other

## 2013-08-30 DIAGNOSIS — M109 Gout, unspecified: Secondary | ICD-10-CM

## 2013-08-31 LAB — URIC ACID: Uric Acid: 3.5 mg/dL — ABNORMAL LOW (ref 3.7–8.6)

## 2013-09-01 DIAGNOSIS — N186 End stage renal disease: Secondary | ICD-10-CM | POA: Diagnosis not present

## 2013-09-02 ENCOUNTER — Encounter: Payer: Self-pay | Admitting: *Deleted

## 2013-09-02 DIAGNOSIS — N2581 Secondary hyperparathyroidism of renal origin: Secondary | ICD-10-CM | POA: Diagnosis not present

## 2013-09-02 DIAGNOSIS — N186 End stage renal disease: Secondary | ICD-10-CM | POA: Diagnosis not present

## 2013-09-05 ENCOUNTER — Telehealth: Payer: Self-pay | Admitting: *Deleted

## 2013-09-05 NOTE — Telephone Encounter (Signed)
Patient called requesting results on his labs and wants script for medication that you spoke about at last visit.

## 2013-09-05 NOTE — Telephone Encounter (Signed)
Noted; please notify pt and tell him to make lifestyle modifications with diet-- if needed please provide pt with diet for pts with GOUT

## 2013-09-05 NOTE — Telephone Encounter (Signed)
LMOM for patient regarding the medication for gout that he requested. I informed him that Janett Billow wants him to modify his diet, to control his gout flare-up. I also stated that I would send him a gout diet in the mail to help him with this new lifestyle change.

## 2013-09-20 ENCOUNTER — Telehealth: Payer: Self-pay | Admitting: *Deleted

## 2013-09-20 NOTE — Telephone Encounter (Signed)
Patient called and stated that he is having another gout flare up and needs a Rx called in to his pharmacy. He said at his last appointment you told him that you could prescribe something he could take on a daily basis to help prevent the gout flare ups. Please Advise.

## 2013-09-22 ENCOUNTER — Other Ambulatory Visit: Payer: Self-pay | Admitting: Nurse Practitioner

## 2013-09-22 DIAGNOSIS — L988 Other specified disorders of the skin and subcutaneous tissue: Secondary | ICD-10-CM | POA: Diagnosis not present

## 2013-09-22 DIAGNOSIS — L821 Other seborrheic keratosis: Secondary | ICD-10-CM | POA: Diagnosis not present

## 2013-09-22 DIAGNOSIS — D485 Neoplasm of uncertain behavior of skin: Secondary | ICD-10-CM | POA: Diagnosis not present

## 2013-09-22 DIAGNOSIS — M109 Gout, unspecified: Secondary | ICD-10-CM

## 2013-09-22 MED ORDER — PREDNISONE 50 MG PO TABS: ORAL_TABLET | ORAL | Status: DC

## 2013-09-22 MED ORDER — FEBUXOSTAT 40 MG PO TABS: 40.0000 mg | ORAL_TABLET | Freq: Every day | ORAL | Status: DC

## 2013-09-22 NOTE — Telephone Encounter (Signed)
Will give prednisone course refill, once done with treatment can take uloric 40 mg daily - to check with renal MD to make sure he is okay with him taking this daily for gout prophylactic

## 2013-09-23 ENCOUNTER — Other Ambulatory Visit: Payer: Self-pay | Admitting: *Deleted

## 2013-09-23 DIAGNOSIS — M109 Gout, unspecified: Secondary | ICD-10-CM

## 2013-09-23 MED ORDER — FEBUXOSTAT 40 MG PO TABS
ORAL_TABLET | ORAL | Status: DC
Start: 2013-09-23 — End: 2014-02-09

## 2013-09-23 MED ORDER — PREDNISONE 50 MG PO TABS: ORAL_TABLET | ORAL | Status: DC

## 2013-09-23 NOTE — Telephone Encounter (Signed)
Patient wife Notified and Rx faxed into [facility]. Wife will check with renal dr. To make sure ok to take Uloric.

## 2013-09-27 DIAGNOSIS — Z79899 Other long term (current) drug therapy: Secondary | ICD-10-CM | POA: Diagnosis not present

## 2013-09-27 DIAGNOSIS — M5137 Other intervertebral disc degeneration, lumbosacral region: Secondary | ICD-10-CM | POA: Diagnosis not present

## 2013-09-27 DIAGNOSIS — G894 Chronic pain syndrome: Secondary | ICD-10-CM | POA: Diagnosis not present

## 2013-09-29 DIAGNOSIS — H2510 Age-related nuclear cataract, unspecified eye: Secondary | ICD-10-CM | POA: Diagnosis not present

## 2013-09-30 ENCOUNTER — Telehealth: Payer: Self-pay | Admitting: *Deleted

## 2013-09-30 NOTE — Telephone Encounter (Signed)
Patient wife Notified

## 2013-09-30 NOTE — Telephone Encounter (Signed)
Patient wife called and stated that patient can not afford the gout medication you prescribed and would like it changed to something else. Please Advise

## 2013-09-30 NOTE — Telephone Encounter (Signed)
Please have pt ask nephrologist due to being on dialysis a lot of gout medication is restricted

## 2013-10-02 DIAGNOSIS — N186 End stage renal disease: Secondary | ICD-10-CM | POA: Diagnosis not present

## 2013-10-03 DIAGNOSIS — N2581 Secondary hyperparathyroidism of renal origin: Secondary | ICD-10-CM | POA: Diagnosis not present

## 2013-10-03 DIAGNOSIS — N186 End stage renal disease: Secondary | ICD-10-CM | POA: Diagnosis not present

## 2013-10-20 DIAGNOSIS — M5137 Other intervertebral disc degeneration, lumbosacral region: Secondary | ICD-10-CM | POA: Diagnosis not present

## 2013-10-25 DIAGNOSIS — G894 Chronic pain syndrome: Secondary | ICD-10-CM | POA: Diagnosis not present

## 2013-10-25 DIAGNOSIS — Z79899 Other long term (current) drug therapy: Secondary | ICD-10-CM | POA: Diagnosis not present

## 2013-10-25 DIAGNOSIS — M545 Low back pain, unspecified: Secondary | ICD-10-CM | POA: Diagnosis not present

## 2013-10-25 DIAGNOSIS — M5137 Other intervertebral disc degeneration, lumbosacral region: Secondary | ICD-10-CM | POA: Diagnosis not present

## 2013-11-01 DIAGNOSIS — N186 End stage renal disease: Secondary | ICD-10-CM | POA: Diagnosis not present

## 2013-11-02 DIAGNOSIS — N186 End stage renal disease: Secondary | ICD-10-CM | POA: Diagnosis not present

## 2013-11-02 DIAGNOSIS — N2581 Secondary hyperparathyroidism of renal origin: Secondary | ICD-10-CM | POA: Diagnosis not present

## 2013-11-02 DIAGNOSIS — D696 Thrombocytopenia, unspecified: Secondary | ICD-10-CM | POA: Diagnosis not present

## 2013-11-02 DIAGNOSIS — D509 Iron deficiency anemia, unspecified: Secondary | ICD-10-CM | POA: Diagnosis not present

## 2013-11-08 DIAGNOSIS — M5137 Other intervertebral disc degeneration, lumbosacral region: Secondary | ICD-10-CM | POA: Diagnosis not present

## 2013-11-08 DIAGNOSIS — Z79899 Other long term (current) drug therapy: Secondary | ICD-10-CM | POA: Diagnosis not present

## 2013-11-08 DIAGNOSIS — G894 Chronic pain syndrome: Secondary | ICD-10-CM | POA: Diagnosis not present

## 2013-11-08 DIAGNOSIS — M545 Low back pain, unspecified: Secondary | ICD-10-CM | POA: Diagnosis not present

## 2013-11-22 ENCOUNTER — Other Ambulatory Visit: Payer: Self-pay | Admitting: *Deleted

## 2013-11-22 DIAGNOSIS — I723 Aneurysm of iliac artery: Secondary | ICD-10-CM

## 2013-11-23 ENCOUNTER — Telehealth: Payer: Self-pay | Admitting: Vascular Surgery

## 2013-11-23 NOTE — Telephone Encounter (Signed)
Spoke with pt and wife - Labs 11/24/13 order faxed to [facility]. Gave them time & date of CTA 12/01/13 7:45 am no food 4 hrs prior and [facility] appt 12/01/13 9am. Pt/wife verbalized understanding.

## 2013-11-24 ENCOUNTER — Other Ambulatory Visit: Payer: Self-pay | Admitting: Vascular Surgery

## 2013-11-24 DIAGNOSIS — N186 End stage renal disease: Secondary | ICD-10-CM | POA: Diagnosis not present

## 2013-11-24 DIAGNOSIS — N185 Chronic kidney disease, stage 5: Secondary | ICD-10-CM | POA: Diagnosis not present

## 2013-11-24 DIAGNOSIS — I723 Aneurysm of iliac artery: Secondary | ICD-10-CM | POA: Diagnosis not present

## 2013-11-24 LAB — CREATININE, SERUM: Creat: 5.28 mg/dL — ABNORMAL HIGH (ref 0.50–1.35)

## 2013-11-24 LAB — BUN: BUN: 18 mg/dL (ref 6–23)

## 2013-11-30 ENCOUNTER — Encounter: Payer: Self-pay | Admitting: Vascular Surgery

## 2013-12-01 ENCOUNTER — Ambulatory Visit (INDEPENDENT_AMBULATORY_CARE_PROVIDER_SITE_OTHER): Payer: Medicare Other | Admitting: Vascular Surgery

## 2013-12-01 ENCOUNTER — Other Ambulatory Visit (HOSPITAL_COMMUNITY): Payer: Medicare Other

## 2013-12-01 ENCOUNTER — Ambulatory Visit
Admission: RE | Admit: 2013-12-01 | Discharge: 2013-12-01 | Disposition: A | Discharge status: Home / Self Care | Payer: Medicare Other | Source: Ambulatory Visit | Attending: Vascular Surgery | Admitting: Vascular Surgery

## 2013-12-01 ENCOUNTER — Encounter: Payer: Self-pay | Admitting: Vascular Surgery

## 2013-12-01 VITALS — BP 106/76 | HR 67 | Ht 74.5 in | Wt 169.0 lb

## 2013-12-01 DIAGNOSIS — I723 Aneurysm of iliac artery: Secondary | ICD-10-CM | POA: Insufficient documentation

## 2013-12-01 MED ORDER — IOHEXOL 350 MG/ML SOLN
80.0000 mL | Freq: Once | INTRAVENOUS | Status: AC | PRN
  Administered 2013-12-01: 80 mL via INTRAVENOUS

## 2013-12-01 NOTE — Progress Notes (Signed)
[facility] [place] HISTORY AND PHYSICAL   History of Present Illness:  Patient is a 63 y.o. year old male who presents for evaluation of common iliac artery aneurysms.  The patient has no family history of aneurysms. He denies any abdominal pain. He has chronic back pain. He has end-stage renal disease and dialyzes Monday Wednesday and Friday. His renal failure secondary to hypertension. Other medical problems include congestive heart failure and anemia both of which are stable. Past Medical History  Diagnosis Date  . End stage renal disease   . Hypertension   . CHF (congestive heart failure)   . Arthritis     gouty  . Chronic back pain   . Heart failure with reduced ejection fraction   . Anemia in chronic kidney disease     Past Surgical History  Procedure Laterality Date  . Av fistula placement Right 07/26/2009  . Ligation of arteriovenous  fistula Right 123456    Procedure: PLICATION OF ARTERIOVENOUS  FISTULA;  Surgeon: Elam Dutch, MD;  Location: [place];  Service: Vascular;  Laterality: Right;  . Insertion of dialysis catheter N/A 02/22/2013    Procedure: INSERTION OF DIALYSIS CATHETER;  Surgeon: Elam Dutch, MD;  Location: [facility];  Service: Vascular;  Laterality: N/A;    Social History History  Substance Use Topics  . Smoking status: Never Smoker   . Smokeless tobacco: Never Used  . Alcohol Use: No    Family History Family History  Problem Relation Age of Onset  . Diabetes Father   . Hypertension Father   . Diabetes Sister     Allergies  No Known Allergies   Current Outpatient Prescriptions  Medication Sig Dispense Refill  . calcitRIOL (ROCALTROL) 0.25 MCG capsule Take 1 mcg by mouth 2 (two) times daily. Take 4 capsules by mouth once daily at the same time daily.      . febuxostat (ULORIC) 40 MG tablet Take one tablet by mouth once daily to prevent gout flare. To start after taking the coarse of Prednisone.  30 tablet  1  .  oxyCODONE-acetaminophen (PERCOCET/ROXICET) 5-325 MG per tablet Take 1 tablet by mouth every 4 (four) hours as needed for severe pain.  40 tablet  0  . atenolol (TENORMIN) 100 MG tablet Take 100 mg by mouth daily. Take 1 and 1/2 tablet every day (to equal 150 mg QD)      . febuxostat (ULORIC) 40 MG tablet Take 1 tablet (40 mg total) by mouth daily.  30 tablet  3  . predniSONE (DELTASONE) 50 MG tablet Take one tablet once a day for 5 days for the inflammation in your fingers  5 tablet  0   No current facility-administered medications for this visit.    ROS:   General:  No weight loss, Fever, chills  HEENT: No recent headaches, no nasal bleeding, no visual changes, no sore throat  Neurologic: No dizziness, blackouts, seizures. No recent symptoms of stroke or mini- stroke. No recent episodes of slurred speech, or temporary blindness.  Cardiac: No recent episodes of chest pain/pressure, no shortness of breath at rest.  No shortness of breath with exertion.  Denies history of atrial fibrillation or irregular heartbeat  Vascular: No history of rest pain in feet.  No history of claudication.  No history of non-healing ulcer, No history of DVT   Pulmonary: No home oxygen, no productive cough, no hemoptysis,  No asthma or wheezing  Musculoskeletal:  [ ]  Arthritis, [x ]  Low back pain,  [ ]  Joint pain  Hematologic:No history of hypercoagulable state.  No history of easy bleeding.  No history of anemia  Gastrointestinal: No hematochezia or melena,  No gastroesophageal reflux, no trouble swallowing  Urinary: [ ]  chronic Kidney disease, [x ] on HD - [ x] MWF or [ ]  TTHS, [ ]  Burning with urination, [ ]  Frequent urination, [ ]  Difficulty urinating;   Skin: No rashes  Psychological: No history of anxiety,  No history of depression   Physical Examination  Filed Vitals:   12/01/13 0857  BP: 106/76  Pulse: 67  Height: 6' 2.5" (1.892 m)  Weight: 169 lb (76.658 kg)  SpO2: 100%    Body mass  index is 21.41 kg/(m^2).  General:  Alert and oriented, no acute distress HEENT: Normal Neck: No bruit or JVD Pulmonary: Clear to auscultation bilaterally Cardiac: Regular Rate and Rhythm without murmur Abdomen: Soft, non-tender, non-distended, no mass, no scars Skin: No rash Extremity Pulses:  2+ radial, brachial, femoral, 3+ right popliteal 2+ left popliteal 2+ dorsalis pedis, posterior tibial pulses bilaterally Musculoskeletal: No deformity or edema  Neurologic: Upper and lower extremity motor 5/5 and symmetric  DATA:  CT angiogram the abdomen and pelvis is reviewed today. This is a CT scan from earlier today. 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   ASSESSMENT:  Bilateral common iliac artery aneurysms left greater than 3 cm diameter, asymptomatic. Multiple mesenteric aneurysms. Possible right popliteal aneurysm.   PLAN:  Open versus stent graft repair of iliac aneurysms was discussed with the patient today. I discussed with him open repair and the risks benefits and possible complications. Also discussed with him stent graft repair and the risks benefits possible complications. I also discussed with him that if we do a stent graft repair he would need coil embolization of the left and right internal iliac arteries and subsequently could half block claudication. He says he currently does have sexual dysfunction. Also minor risk of skin necrosis or gut ischemia. At this point he is leaning towards a stent graft repair. I discussed with him that we do this has a 3 stage process. We will coil embolize 1 hypogastric followed by the other one several weeks later followed by stent graft repair. He is going to discuss this with his wife before scheduling. I discussed with him that if he develops severe back or abdominal pain to present to  the emergency room and let them know he has an aneurysm.   [name], MD [facility] [place] Office: [phone] Pager: [phone]

## 2013-12-02 DIAGNOSIS — N186 End stage renal disease: Secondary | ICD-10-CM | POA: Diagnosis not present

## 2013-12-05 DIAGNOSIS — D696 Thrombocytopenia, unspecified: Secondary | ICD-10-CM | POA: Diagnosis not present

## 2013-12-05 DIAGNOSIS — N2581 Secondary hyperparathyroidism of renal origin: Secondary | ICD-10-CM | POA: Diagnosis not present

## 2013-12-05 DIAGNOSIS — D509 Iron deficiency anemia, unspecified: Secondary | ICD-10-CM | POA: Diagnosis not present

## 2013-12-05 DIAGNOSIS — N186 End stage renal disease: Secondary | ICD-10-CM | POA: Diagnosis not present

## 2013-12-07 DIAGNOSIS — D696 Thrombocytopenia, unspecified: Secondary | ICD-10-CM | POA: Diagnosis not present

## 2013-12-07 DIAGNOSIS — D509 Iron deficiency anemia, unspecified: Secondary | ICD-10-CM | POA: Diagnosis not present

## 2013-12-07 DIAGNOSIS — N2581 Secondary hyperparathyroidism of renal origin: Secondary | ICD-10-CM | POA: Diagnosis not present

## 2013-12-07 DIAGNOSIS — N186 End stage renal disease: Secondary | ICD-10-CM | POA: Diagnosis not present

## 2013-12-09 DIAGNOSIS — D696 Thrombocytopenia, unspecified: Secondary | ICD-10-CM | POA: Diagnosis not present

## 2013-12-09 DIAGNOSIS — N186 End stage renal disease: Secondary | ICD-10-CM | POA: Diagnosis not present

## 2013-12-09 DIAGNOSIS — D509 Iron deficiency anemia, unspecified: Secondary | ICD-10-CM | POA: Diagnosis not present

## 2013-12-09 DIAGNOSIS — N2581 Secondary hyperparathyroidism of renal origin: Secondary | ICD-10-CM | POA: Diagnosis not present

## 2013-12-12 DIAGNOSIS — D696 Thrombocytopenia, unspecified: Secondary | ICD-10-CM | POA: Diagnosis not present

## 2013-12-12 DIAGNOSIS — N2581 Secondary hyperparathyroidism of renal origin: Secondary | ICD-10-CM | POA: Diagnosis not present

## 2013-12-12 DIAGNOSIS — D509 Iron deficiency anemia, unspecified: Secondary | ICD-10-CM | POA: Diagnosis not present

## 2013-12-12 DIAGNOSIS — N186 End stage renal disease: Secondary | ICD-10-CM | POA: Diagnosis not present

## 2013-12-14 DIAGNOSIS — D696 Thrombocytopenia, unspecified: Secondary | ICD-10-CM | POA: Diagnosis not present

## 2013-12-14 DIAGNOSIS — N2581 Secondary hyperparathyroidism of renal origin: Secondary | ICD-10-CM | POA: Diagnosis not present

## 2013-12-14 DIAGNOSIS — N186 End stage renal disease: Secondary | ICD-10-CM | POA: Diagnosis not present

## 2013-12-14 DIAGNOSIS — D509 Iron deficiency anemia, unspecified: Secondary | ICD-10-CM | POA: Diagnosis not present

## 2013-12-16 DIAGNOSIS — N2581 Secondary hyperparathyroidism of renal origin: Secondary | ICD-10-CM | POA: Diagnosis not present

## 2013-12-16 DIAGNOSIS — D509 Iron deficiency anemia, unspecified: Secondary | ICD-10-CM | POA: Diagnosis not present

## 2013-12-16 DIAGNOSIS — D696 Thrombocytopenia, unspecified: Secondary | ICD-10-CM | POA: Diagnosis not present

## 2013-12-16 DIAGNOSIS — N186 End stage renal disease: Secondary | ICD-10-CM | POA: Diagnosis not present

## 2013-12-19 DIAGNOSIS — D509 Iron deficiency anemia, unspecified: Secondary | ICD-10-CM | POA: Diagnosis not present

## 2013-12-19 DIAGNOSIS — N2581 Secondary hyperparathyroidism of renal origin: Secondary | ICD-10-CM | POA: Diagnosis not present

## 2013-12-19 DIAGNOSIS — D696 Thrombocytopenia, unspecified: Secondary | ICD-10-CM | POA: Diagnosis not present

## 2013-12-19 DIAGNOSIS — N186 End stage renal disease: Secondary | ICD-10-CM | POA: Diagnosis not present

## 2013-12-21 DIAGNOSIS — D696 Thrombocytopenia, unspecified: Secondary | ICD-10-CM | POA: Diagnosis not present

## 2013-12-21 DIAGNOSIS — N186 End stage renal disease: Secondary | ICD-10-CM | POA: Diagnosis not present

## 2013-12-21 DIAGNOSIS — N2581 Secondary hyperparathyroidism of renal origin: Secondary | ICD-10-CM | POA: Diagnosis not present

## 2013-12-21 DIAGNOSIS — D509 Iron deficiency anemia, unspecified: Secondary | ICD-10-CM | POA: Diagnosis not present

## 2013-12-22 ENCOUNTER — Encounter: Payer: Self-pay | Admitting: Vascular Surgery

## 2013-12-23 DIAGNOSIS — N186 End stage renal disease: Secondary | ICD-10-CM | POA: Diagnosis not present

## 2013-12-23 DIAGNOSIS — D696 Thrombocytopenia, unspecified: Secondary | ICD-10-CM | POA: Diagnosis not present

## 2013-12-23 DIAGNOSIS — N2581 Secondary hyperparathyroidism of renal origin: Secondary | ICD-10-CM | POA: Diagnosis not present

## 2013-12-23 DIAGNOSIS — D509 Iron deficiency anemia, unspecified: Secondary | ICD-10-CM | POA: Diagnosis not present

## 2013-12-26 DIAGNOSIS — D696 Thrombocytopenia, unspecified: Secondary | ICD-10-CM | POA: Diagnosis not present

## 2013-12-26 DIAGNOSIS — N186 End stage renal disease: Secondary | ICD-10-CM | POA: Diagnosis not present

## 2013-12-26 DIAGNOSIS — D509 Iron deficiency anemia, unspecified: Secondary | ICD-10-CM | POA: Diagnosis not present

## 2013-12-26 DIAGNOSIS — N2581 Secondary hyperparathyroidism of renal origin: Secondary | ICD-10-CM | POA: Diagnosis not present

## 2013-12-27 ENCOUNTER — Telehealth: Payer: Self-pay

## 2013-12-27 NOTE — Telephone Encounter (Signed)
Rec'd call back from pt. regarding procedure for AGM with Coil Embolization of Left Internal Iliac Artery to be scheduled for 12/30/13.  Pt. Stated he is not ready to do the procedure this week.  Stated he dialyzes on M-W-F, and would not be able to do it on a Friday.  Advised that Dr. Oneida Alar usually does the PV procedures on Fridays, and we could call his dialysis center, and arrange to have his Friday dialysis schedule moved to another day.  The patient stated he will call back, in a week or two, to reschedule the procedure.  Dr. Oneida Alar made aware of pt's decision.

## 2013-12-28 DIAGNOSIS — D509 Iron deficiency anemia, unspecified: Secondary | ICD-10-CM | POA: Diagnosis not present

## 2013-12-28 DIAGNOSIS — N186 End stage renal disease: Secondary | ICD-10-CM | POA: Diagnosis not present

## 2013-12-28 DIAGNOSIS — N2581 Secondary hyperparathyroidism of renal origin: Secondary | ICD-10-CM | POA: Diagnosis not present

## 2013-12-28 DIAGNOSIS — D696 Thrombocytopenia, unspecified: Secondary | ICD-10-CM | POA: Diagnosis not present

## 2013-12-30 DIAGNOSIS — D509 Iron deficiency anemia, unspecified: Secondary | ICD-10-CM | POA: Diagnosis not present

## 2013-12-30 DIAGNOSIS — N2581 Secondary hyperparathyroidism of renal origin: Secondary | ICD-10-CM | POA: Diagnosis not present

## 2013-12-30 DIAGNOSIS — D696 Thrombocytopenia, unspecified: Secondary | ICD-10-CM | POA: Diagnosis not present

## 2013-12-30 DIAGNOSIS — N186 End stage renal disease: Secondary | ICD-10-CM | POA: Diagnosis not present

## 2014-01-02 ENCOUNTER — Telehealth: Payer: Self-pay | Admitting: *Deleted

## 2014-01-02 DIAGNOSIS — N186 End stage renal disease: Secondary | ICD-10-CM | POA: Diagnosis not present

## 2014-01-02 DIAGNOSIS — D696 Thrombocytopenia, unspecified: Secondary | ICD-10-CM | POA: Diagnosis not present

## 2014-01-02 DIAGNOSIS — D509 Iron deficiency anemia, unspecified: Secondary | ICD-10-CM | POA: Diagnosis not present

## 2014-01-02 DIAGNOSIS — N2581 Secondary hyperparathyroidism of renal origin: Secondary | ICD-10-CM | POA: Diagnosis not present

## 2014-01-02 NOTE — Telephone Encounter (Signed)
I have spoken to patient. He states that he has not turned in his kit. I reiterated how important it is for him to do the test and turn it in as it will determine the timing of his next colonoscopy. He verbalizes understanding and states that he will send the kit in soon. 

## 2014-01-02 NOTE — Telephone Encounter (Signed)
I have received a note from [employer] stating that this patient never turned in their Cologuard kit that was sent to them on 07/12/13. I have left a message for patient to call back.

## 2014-01-03 DIAGNOSIS — M545 Low back pain, unspecified: Secondary | ICD-10-CM | POA: Diagnosis not present

## 2014-01-03 DIAGNOSIS — M5137 Other intervertebral disc degeneration, lumbosacral region: Secondary | ICD-10-CM | POA: Diagnosis not present

## 2014-01-03 DIAGNOSIS — Z79899 Other long term (current) drug therapy: Secondary | ICD-10-CM | POA: Diagnosis not present

## 2014-01-03 DIAGNOSIS — G894 Chronic pain syndrome: Secondary | ICD-10-CM | POA: Diagnosis not present

## 2014-01-04 DIAGNOSIS — D631 Anemia in chronic kidney disease: Secondary | ICD-10-CM | POA: Diagnosis not present

## 2014-01-04 DIAGNOSIS — D509 Iron deficiency anemia, unspecified: Secondary | ICD-10-CM | POA: Diagnosis not present

## 2014-01-04 DIAGNOSIS — N039 Chronic nephritic syndrome with unspecified morphologic changes: Secondary | ICD-10-CM | POA: Diagnosis not present

## 2014-01-04 DIAGNOSIS — N186 End stage renal disease: Secondary | ICD-10-CM | POA: Diagnosis not present

## 2014-01-04 DIAGNOSIS — N2581 Secondary hyperparathyroidism of renal origin: Secondary | ICD-10-CM | POA: Diagnosis not present

## 2014-01-06 DIAGNOSIS — N186 End stage renal disease: Secondary | ICD-10-CM | POA: Diagnosis not present

## 2014-01-06 DIAGNOSIS — D509 Iron deficiency anemia, unspecified: Secondary | ICD-10-CM | POA: Diagnosis not present

## 2014-01-06 DIAGNOSIS — D631 Anemia in chronic kidney disease: Secondary | ICD-10-CM | POA: Diagnosis not present

## 2014-01-06 DIAGNOSIS — N2581 Secondary hyperparathyroidism of renal origin: Secondary | ICD-10-CM | POA: Diagnosis not present

## 2014-01-09 DIAGNOSIS — D509 Iron deficiency anemia, unspecified: Secondary | ICD-10-CM | POA: Diagnosis not present

## 2014-01-09 DIAGNOSIS — N039 Chronic nephritic syndrome with unspecified morphologic changes: Secondary | ICD-10-CM | POA: Diagnosis not present

## 2014-01-09 DIAGNOSIS — N186 End stage renal disease: Secondary | ICD-10-CM | POA: Diagnosis not present

## 2014-01-09 DIAGNOSIS — N2581 Secondary hyperparathyroidism of renal origin: Secondary | ICD-10-CM | POA: Diagnosis not present

## 2014-01-09 DIAGNOSIS — D631 Anemia in chronic kidney disease: Secondary | ICD-10-CM | POA: Diagnosis not present

## 2014-01-11 DIAGNOSIS — D631 Anemia in chronic kidney disease: Secondary | ICD-10-CM | POA: Diagnosis not present

## 2014-01-11 DIAGNOSIS — D509 Iron deficiency anemia, unspecified: Secondary | ICD-10-CM | POA: Diagnosis not present

## 2014-01-11 DIAGNOSIS — N2581 Secondary hyperparathyroidism of renal origin: Secondary | ICD-10-CM | POA: Diagnosis not present

## 2014-01-11 DIAGNOSIS — N186 End stage renal disease: Secondary | ICD-10-CM | POA: Diagnosis not present

## 2014-01-13 DIAGNOSIS — N2581 Secondary hyperparathyroidism of renal origin: Secondary | ICD-10-CM | POA: Diagnosis not present

## 2014-01-13 DIAGNOSIS — D509 Iron deficiency anemia, unspecified: Secondary | ICD-10-CM | POA: Diagnosis not present

## 2014-01-13 DIAGNOSIS — D631 Anemia in chronic kidney disease: Secondary | ICD-10-CM | POA: Diagnosis not present

## 2014-01-13 DIAGNOSIS — N186 End stage renal disease: Secondary | ICD-10-CM | POA: Diagnosis not present

## 2014-01-13 DIAGNOSIS — N039 Chronic nephritic syndrome with unspecified morphologic changes: Secondary | ICD-10-CM | POA: Diagnosis not present

## 2014-01-16 DIAGNOSIS — N186 End stage renal disease: Secondary | ICD-10-CM | POA: Diagnosis not present

## 2014-01-16 DIAGNOSIS — D509 Iron deficiency anemia, unspecified: Secondary | ICD-10-CM | POA: Diagnosis not present

## 2014-01-16 DIAGNOSIS — D631 Anemia in chronic kidney disease: Secondary | ICD-10-CM | POA: Diagnosis not present

## 2014-01-16 DIAGNOSIS — N2581 Secondary hyperparathyroidism of renal origin: Secondary | ICD-10-CM | POA: Diagnosis not present

## 2014-01-18 DIAGNOSIS — D631 Anemia in chronic kidney disease: Secondary | ICD-10-CM | POA: Diagnosis not present

## 2014-01-18 DIAGNOSIS — D509 Iron deficiency anemia, unspecified: Secondary | ICD-10-CM | POA: Diagnosis not present

## 2014-01-18 DIAGNOSIS — N186 End stage renal disease: Secondary | ICD-10-CM | POA: Diagnosis not present

## 2014-01-18 DIAGNOSIS — N2581 Secondary hyperparathyroidism of renal origin: Secondary | ICD-10-CM | POA: Diagnosis not present

## 2014-01-20 DIAGNOSIS — N186 End stage renal disease: Secondary | ICD-10-CM | POA: Diagnosis not present

## 2014-01-20 DIAGNOSIS — D509 Iron deficiency anemia, unspecified: Secondary | ICD-10-CM | POA: Diagnosis not present

## 2014-01-20 DIAGNOSIS — N2581 Secondary hyperparathyroidism of renal origin: Secondary | ICD-10-CM | POA: Diagnosis not present

## 2014-01-20 DIAGNOSIS — N039 Chronic nephritic syndrome with unspecified morphologic changes: Secondary | ICD-10-CM | POA: Diagnosis not present

## 2014-01-20 DIAGNOSIS — D631 Anemia in chronic kidney disease: Secondary | ICD-10-CM | POA: Diagnosis not present

## 2014-01-23 DIAGNOSIS — N2581 Secondary hyperparathyroidism of renal origin: Secondary | ICD-10-CM | POA: Diagnosis not present

## 2014-01-23 DIAGNOSIS — N186 End stage renal disease: Secondary | ICD-10-CM | POA: Diagnosis not present

## 2014-01-23 DIAGNOSIS — D631 Anemia in chronic kidney disease: Secondary | ICD-10-CM | POA: Diagnosis not present

## 2014-01-23 DIAGNOSIS — D509 Iron deficiency anemia, unspecified: Secondary | ICD-10-CM | POA: Diagnosis not present

## 2014-01-23 DIAGNOSIS — N039 Chronic nephritic syndrome with unspecified morphologic changes: Secondary | ICD-10-CM | POA: Diagnosis not present

## 2014-01-25 ENCOUNTER — Ambulatory Visit: Payer: Medicare Other | Admitting: Vascular Surgery

## 2014-01-25 DIAGNOSIS — N039 Chronic nephritic syndrome with unspecified morphologic changes: Secondary | ICD-10-CM | POA: Diagnosis not present

## 2014-01-25 DIAGNOSIS — N186 End stage renal disease: Secondary | ICD-10-CM | POA: Diagnosis not present

## 2014-01-25 DIAGNOSIS — D509 Iron deficiency anemia, unspecified: Secondary | ICD-10-CM | POA: Diagnosis not present

## 2014-01-25 DIAGNOSIS — D631 Anemia in chronic kidney disease: Secondary | ICD-10-CM | POA: Diagnosis not present

## 2014-01-25 DIAGNOSIS — N2581 Secondary hyperparathyroidism of renal origin: Secondary | ICD-10-CM | POA: Diagnosis not present

## 2014-01-27 DIAGNOSIS — D509 Iron deficiency anemia, unspecified: Secondary | ICD-10-CM | POA: Diagnosis not present

## 2014-01-27 DIAGNOSIS — N186 End stage renal disease: Secondary | ICD-10-CM | POA: Diagnosis not present

## 2014-01-27 DIAGNOSIS — N2581 Secondary hyperparathyroidism of renal origin: Secondary | ICD-10-CM | POA: Diagnosis not present

## 2014-01-27 DIAGNOSIS — D631 Anemia in chronic kidney disease: Secondary | ICD-10-CM | POA: Diagnosis not present

## 2014-01-30 DIAGNOSIS — N186 End stage renal disease: Secondary | ICD-10-CM | POA: Diagnosis not present

## 2014-01-30 DIAGNOSIS — N2581 Secondary hyperparathyroidism of renal origin: Secondary | ICD-10-CM | POA: Diagnosis not present

## 2014-01-30 DIAGNOSIS — D509 Iron deficiency anemia, unspecified: Secondary | ICD-10-CM | POA: Diagnosis not present

## 2014-01-30 DIAGNOSIS — D631 Anemia in chronic kidney disease: Secondary | ICD-10-CM | POA: Diagnosis not present

## 2014-02-01 ENCOUNTER — Encounter: Payer: Self-pay | Admitting: Vascular Surgery

## 2014-02-01 DIAGNOSIS — D631 Anemia in chronic kidney disease: Secondary | ICD-10-CM | POA: Diagnosis not present

## 2014-02-01 DIAGNOSIS — N2581 Secondary hyperparathyroidism of renal origin: Secondary | ICD-10-CM | POA: Diagnosis not present

## 2014-02-01 DIAGNOSIS — D509 Iron deficiency anemia, unspecified: Secondary | ICD-10-CM | POA: Diagnosis not present

## 2014-02-01 DIAGNOSIS — N186 End stage renal disease: Secondary | ICD-10-CM | POA: Diagnosis not present

## 2014-02-02 ENCOUNTER — Encounter: Payer: Self-pay | Admitting: Vascular Surgery

## 2014-02-02 ENCOUNTER — Ambulatory Visit (INDEPENDENT_AMBULATORY_CARE_PROVIDER_SITE_OTHER): Payer: Medicare Other | Admitting: Vascular Surgery

## 2014-02-02 VITALS — BP 112/77 | HR 74 | Resp 14

## 2014-02-02 DIAGNOSIS — I723 Aneurysm of iliac artery: Secondary | ICD-10-CM

## 2014-02-02 NOTE — Progress Notes (Signed)
[facility] [place] HISTORY AND PHYSICAL    History of Present Illness:  Patient is a 63 y.o. year old male who presents for followup evaluation of common iliac artery aneurysms.  The patient has no family history of aneurysms. He denies any abdominal pain. He has chronic back pain. He has end-stage renal disease and dialyzes Monday Wednesday and Friday. His renal failure secondary to hypertension. Other medical problems include congestive heart failure and anemia both of which are stable. Past Medical History   Diagnosis  Date   .  End stage renal disease     .  Hypertension     .  CHF (congestive heart failure)     .  Arthritis         gouty   .  Chronic back pain     .  Heart failure with reduced ejection fraction     .  Anemia in chronic kidney disease         Past Surgical History   Procedure  Laterality  Date   .  Av fistula placement  Right  07/26/2009   .  Ligation of arteriovenous  fistula  Right  02/22/2013       Procedure: PLICATION OF ARTERIOVENOUS  FISTULA;  Surgeon: Elam Dutch, MD;  Location: [place];  Service: Vascular;  Laterality: Right;   .  Insertion of dialysis catheter  N/A  02/22/2013       Procedure: INSERTION OF DIALYSIS CATHETER;  Surgeon: Elam Dutch, MD;  Location: [facility] OR;  Service: Vascular;  Laterality: N/A;     Social History History   Substance Use Topics   .  Smoking status:  Never Smoker    .  Smokeless tobacco:  Never Used   .  Alcohol Use:  No     Family History Family History   Problem  Relation  Age of Onset   .  Diabetes  Father     .  Hypertension  Father     .  Diabetes  Sister       Allergies  No Known Allergies     Current Outpatient Prescriptions   Medication  Sig  Dispense  Refill   .  calcitRIOL (ROCALTROL) 0.25 MCG capsule  Take 1 mcg by mouth 2 (two) times daily. Take 4 capsules by mouth once daily at the same time daily.         .  febuxostat (ULORIC) 40 MG tablet  Take one tablet by mouth  once daily to prevent gout flare. To start after taking the coarse of Prednisone.   30 tablet   1   .  oxyCODONE-acetaminophen (PERCOCET/ROXICET) 5-325 MG per tablet  Take 1 tablet by mouth every 4 (four) hours as needed for severe pain.   40 tablet   0   .  atenolol (TENORMIN) 100 MG tablet  Take 100 mg by mouth daily. Take 1 and 1/2 tablet every day (to equal 150 mg QD)         .  febuxostat (ULORIC) 40 MG tablet  Take 1 tablet (40 mg total) by mouth daily.   30 tablet   3   .  predniSONE (DELTASONE) 50 MG tablet  Take one tablet once a day for 5 days for the inflammation in your fingers   5 tablet   0      No current facility-administered medications for this visit.     ROS:    General:  No weight loss, Fever, chills  HEENT: No recent headaches, no nasal bleeding, no visual changes, no sore throat  Neurologic: No dizziness, blackouts, seizures. No recent symptoms of stroke or mini- stroke. No recent episodes of slurred speech, or temporary blindness.  Cardiac: No recent episodes of chest pain/pressure, no shortness of breath at rest.  No shortness of breath with exertion.  Denies history of atrial fibrillation or irregular heartbeat  Vascular: No history of rest pain in feet.  No history of claudication.  No history of non-healing ulcer, No history of DVT    Pulmonary: No home oxygen, no productive cough, no hemoptysis,  No asthma or wheezing  Musculoskeletal:  [ ]  Arthritis, [x ] Low back pain,  [ ]  Joint pain  Hematologic:No history of hypercoagulable state.  No history of easy bleeding.  No history of anemia  Gastrointestinal: No hematochezia or melena,  No gastroesophageal reflux, no trouble swallowing  Urinary: [ ]  chronic Kidney disease, [x ] on HD - [ x] MWF or [ ]  TTHS, [ ]  Burning with urination, [ ]  Frequent urination, [ ]  Difficulty urinating;    Skin: No rashes  Psychological: No history of anxiety,  No history of depression   Physical Examination    Filed Vitals:    02/02/14 1308  BP: 112/77  Pulse: 74  Resp: 14    General:  Alert and oriented, no acute distress HEENT: Normal Neck: No bruit or JVD Pulmonary: Clear to auscultation bilaterally Cardiac: Regular Rate and Rhythm without murmur Abdomen: Soft, non-tender, non-distended, no mass, no scars Skin: No rash Extremity Pulses:  2+ radial, brachial, femoral, 3+ right popliteal 2+ left popliteal 2+ dorsalis pedis, posterior tibial pulses bilaterally, right upper extremity upper arm AV fistula palpable thrill audible bruit some aneurysmal dilation proximally Musculoskeletal: No deformity or edema     Neurologic: Upper and lower extremity motor 5/5 and symmetric  DATA:  CT angiogram the abdomen and pelvis is reviewed today. This is a CT scan from July 2015. 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   ASSESSMENT:  Bilateral common iliac artery aneurysms left greater than 3 cm diameter, asymptomatic. Multiple mesenteric aneurysms.   PLAN:  Open versus stent graft repair of iliac aneurysms was discussed with the patient today. I discussed with him open repair and the risks benefits and possible complications. Also discussed with him stent graft repair and the risks benefits possible complications.  At the patient's initial visit we have talked about coil embolization of both internal iliac arteries and stent graft repair. However on further review the films I believe we can probably get by with just coil embolization of the left internal iliac artery and continued surveillance in the right side. Due to the patient's dialysis schedule of Monday Wednesday Friday. I believe the best option would be to do this as a 1 stage procedure. This is scheduled for 02/14/2014. Risks benefits possible complications and procedure details were explained the patient  today and in his previous office visit. He understands and agrees to proceed.  [name], MD [facility] [place] Office: [phone] Pager: [phone]

## 2014-02-03 ENCOUNTER — Other Ambulatory Visit: Payer: Self-pay

## 2014-02-03 DIAGNOSIS — D631 Anemia in chronic kidney disease: Secondary | ICD-10-CM | POA: Diagnosis not present

## 2014-02-03 DIAGNOSIS — D509 Iron deficiency anemia, unspecified: Secondary | ICD-10-CM | POA: Diagnosis not present

## 2014-02-03 DIAGNOSIS — N186 End stage renal disease: Secondary | ICD-10-CM | POA: Diagnosis not present

## 2014-02-03 DIAGNOSIS — Z23 Encounter for immunization: Secondary | ICD-10-CM | POA: Diagnosis not present

## 2014-02-09 ENCOUNTER — Encounter (HOSPITAL_COMMUNITY): Payer: Self-pay | Admitting: Pharmacy Technician

## 2014-02-13 ENCOUNTER — Encounter (HOSPITAL_COMMUNITY): Payer: Self-pay | Admitting: *Deleted

## 2014-02-13 ENCOUNTER — Encounter (HOSPITAL_COMMUNITY): Payer: Self-pay | Admitting: Vascular Surgery

## 2014-02-13 ENCOUNTER — Ambulatory Visit: Payer: Medicare Other | Admitting: Physician Assistant

## 2014-02-13 NOTE — Progress Notes (Signed)
Anesthesia Chart Review: Patient is a 63 year old male posted for coil embolization of left iliac artery, insertion of left iliac stent on 02/14/14 by Dr. Oneida Alar. He is scheduled to be a same day work-up.  History includes bilateral common iliac artery, celiac, mid SMA aneurysms, ESRD (started 12/2012; [place] MWF), RUE AVF creation '11 with revision and insertion of left IJ Diatek 02/22/13, parathyroidectomy, non-smoker, HTN, arthritis, chronic combined CHF diagnosed during 12/2012 hospitalization (no cardiology work-up that I see; discharge summary only mentions consider cardiology follow-up for reduced EF), chronic back pain, thrombocytopenia with negative HIT panel 12/16/12. PCP is listed as [facility]. Dewaine Oats, NP [phone]), established just last year (02/24/13, but with regular follow-up). Nephrologist is Dr. Jamal Maes.  Meds listed: Calcitriol, Phoslo, Percocet, prednisone 50 mg Q day PRN for gout.  Echo on 12/12/12 (done during admission for worsening CKD with volume excess--need to initiate HD) showed:  - Left ventricle: The cavity size was mildly dilated. Wall thickness was increased in a pattern of moderate LVH. Systolic function was moderately to severely reduced. The estimated ejection fraction was in the range of 30% to 35%. Diffuse hypokinesis. Doppler parameters are consistent with abnormal left ventricular relaxation (grade 1 diastolic dysfunction). Doppler parameters are consistent with high ventricular filling pressure. - Ventricular septum: The contour showed diastolic flattening and systolic flattening. - Mitral valve: Calcified annulus. Mild regurgitation. - Left atrium: The atrium was mildly dilated. - Right ventricle: The cavity size was severely dilated. Systolic function was severely reduced. - Right atrium: The atrium was severely dilated. - Pulmonary arteries: PA peak pressure: 75mm Hg (S). - Pericardium, extracardiac: There was a left pleural effusion. -  Impressions: There is biventricular dysfunction with RV dyfunction worse than LV. Impressions: There is biventricular dysfunction with RV dysfunction worse than LV.  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 2.7 cm right common iliac aneurysm, 3.6 cm left common iliac aneurysms. There is some chronic mural thrombus along the distal aneurysmal segment of the left common iliac artery. There is serpiginous low density within the mid and distal right common iliac artery highly worrisome for I dissection flap. It does not appear flow limiting. (According to Dr. Nona Dell 02/02/14 note, "At the patient's initial visit we have talked about coil embolization of both internal iliac arteries and stent graft repair. However on further review the films I believe we can probably get by with just coil embolization of the left internal iliac artery and continued surveillance in the right side." Also according to his note, the infrarenal AA measures 22 mm.)  His last 2V CXR and EKG are > 18 year old, so they would need to be repeated prior to surgery. He will also need labs preoperatively.    I reviewed above with anesthesiologist Dr. Deatra Canter. Patient with LV dysfunction on echo last year.  It does not appear he was ever formally evaluated by cardiology. He is now scheduled for coil embolization and stent of left iliac artery aneurysm.  Recommend pre-operative cardiology evaluation.  Colletta Maryland, RN at [facility] notified and contacted [facility].  He is going to be seen on 03/02/14 with tentative plans to rescheduled surgery for 03/07/14.  [facility] Short Stay Center/Anesthesiology Phone [phone] 02/13/2014 1:36 PM

## 2014-02-23 ENCOUNTER — Ambulatory Visit: Payer: Medicare Other | Admitting: Cardiology

## 2014-02-23 ENCOUNTER — Ambulatory Visit (INDEPENDENT_AMBULATORY_CARE_PROVIDER_SITE_OTHER): Payer: Medicare Other | Admitting: Nurse Practitioner

## 2014-02-23 ENCOUNTER — Encounter: Payer: Self-pay | Admitting: Nurse Practitioner

## 2014-02-23 VITALS — BP 116/74 | HR 72 | Temp 98.4°F | Resp 10 | Ht 74.08 in | Wt 174.0 lb

## 2014-02-23 DIAGNOSIS — Z13220 Encounter for screening for lipoid disorders: Secondary | ICD-10-CM | POA: Diagnosis not present

## 2014-02-23 DIAGNOSIS — N186 End stage renal disease: Secondary | ICD-10-CM | POA: Diagnosis not present

## 2014-02-23 DIAGNOSIS — I10 Essential (primary) hypertension: Secondary | ICD-10-CM

## 2014-02-23 DIAGNOSIS — M109 Gout, unspecified: Secondary | ICD-10-CM

## 2014-02-23 DIAGNOSIS — Z992 Dependence on renal dialysis: Secondary | ICD-10-CM

## 2014-02-23 DIAGNOSIS — N189 Chronic kidney disease, unspecified: Secondary | ICD-10-CM

## 2014-02-23 DIAGNOSIS — G8929 Other chronic pain: Secondary | ICD-10-CM

## 2014-02-23 DIAGNOSIS — D631 Anemia in chronic kidney disease: Secondary | ICD-10-CM

## 2014-02-23 DIAGNOSIS — M549 Dorsalgia, unspecified: Secondary | ICD-10-CM | POA: Diagnosis not present

## 2014-02-23 NOTE — Progress Notes (Signed)
Patient ID: Gregory [name], male   DOB: 1950-12-26, 63 y.o.   MRN: [id number]    No Known Allergies  Chief Complaint  Patient presents with  . Medical Management of Chronic Issues    6 month follow-up, fasting if any labs due     HPI: Patient is a 63 y.o. male seen in the office today for routine follow up; reports he is doing well other than gout that continues to flare 2-3 times a month. Was taking prednisone which does well to treat. Follows low purine diet.  Flu shot given at dialysis.  Had mole removed at dermatology, following as needed   Information given for colorectal screening- has never had this done. Reports he will "get back to you on that one"  Orthopedics prescribing pain medication but does not feel like pain is managed appropriately, would like a second option   Went to ophthalmologist 2 months ago for routine follow up  Review of Systems:  Review of Systems  Constitutional: Negative for fever, chills, weight loss and malaise/fatigue.  Respiratory: Negative for cough and shortness of breath.   Cardiovascular: Negative for chest pain, palpitations and leg swelling.  Gastrointestinal: Negative for heartburn, nausea, vomiting, abdominal pain, diarrhea, constipation and melena.  Genitourinary: Negative for dysuria, urgency and frequency.       HD on  MWF  Musculoskeletal: Positive for back pain (no longer controlled on medication ) and joint pain (to left index finger). Negative for falls.  Skin: Negative.   Neurological: Negative for dizziness, weakness and headaches.  Psychiatric/Behavioral: Negative for depression and memory loss. The patient is not nervous/anxious.      Past Medical History  Diagnosis Date  . End stage renal disease   . Hypertension   . CHF (congestive heart failure)   . Arthritis     gouty  . Chronic back pain   . Heart failure with reduced ejection fraction   . Anemia in chronic kidney disease    Past Surgical History  Procedure  Laterality Date  . Av fistula placement Right 07/26/2009  . Ligation of arteriovenous  fistula Right [id number]    Procedure: PLICATION OF ARTERIOVENOUS  FISTULA;  Surgeon: Elam Dutch, MD;  Location: [place];  Service: Vascular;  Laterality: Right;  . Insertion of dialysis catheter N/A 02/22/2013    Procedure: INSERTION OF DIALYSIS CATHETER;  Surgeon: Elam Dutch, MD;  Location: [place];  Service: Vascular;  Laterality: N/A;   Social History:   reports that he has never smoked. He has never used smokeless tobacco. He reports that he does not drink alcohol or use illicit drugs.  Family History  Problem Relation Age of Onset  . Diabetes Father   . Hypertension Father   . Diabetes Sister     Medications: Patient's Medications  New Prescriptions   No medications on file  Previous Medications   CALCITRIOL (ROCALTROL) 0.25 MCG CAPSULE    Take 0.25 mcg by mouth 2 (two) times daily.    CALCIUM ACETATE (PHOSLO) 667 MG CAPSULE    Take 1,334 mg by mouth 2 (two) times daily with a meal.   OXYCODONE-ACETAMINOPHEN (PERCOCET) 10-325 MG PER TABLET    Take 1 tablet by mouth 3 (three) times daily.   PREDNISONE (DELTASONE) 50 MG TABLET    Take 50 mg by mouth daily as needed (GOUT).  Modified Medications   No medications on file  Discontinued Medications   No medications on file     Physical Exam:  Filed Vitals:   02/23/14 0853  BP: 116/74  Pulse: 72  Temp: 98.4 F (36.9 C)  TempSrc: Oral  Resp: 10  Height: 6' 2.08" (1.882 m)  Weight: 174 lb (78.926 kg)   Physical Exam  Constitutional: He is oriented to person, place, and time and well-developed, well-nourished, and in no distress.  HENT:  Head: Normocephalic and atraumatic.  Nose: Nose normal.  Mouth/Throat: Oropharynx is clear and moist. No oropharyngeal exudate.  Eyes: Conjunctivae and EOM are normal. Pupils are equal, round, and reactive to light.  Neck: Normal range of motion. Neck supple.  Cardiovascular: Normal rate  and regular rhythm.   Audible bruit throughout chest and into neck  Pulmonary/Chest: Effort normal and breath sounds normal.  Abdominal: Soft. Bowel sounds are normal. He exhibits no distension. There is no tenderness.  Musculoskeletal: Normal range of motion. He exhibits no edema.  Neurological: He is alert and oriented to person, place, and time.  Skin: Skin is warm and dry. He is not diaphoretic.  Psychiatric: Affect normal.     Labs reviewed: Basic Metabolic Panel:  Recent Labs  02/24/13 1019 04/07/13 1230 11/24/13 1008  NA 139 143  --   K 3.9 5.1  --   CL 96* 97  --   CO2 28 32*  --   GLUCOSE 78 83  --   BUN 15 25 18   CREATININE 3.91* 4.72* 5.28*  CALCIUM 8.6 9.4  --   TSH  --  1.260  --    Liver Function Tests:  Recent Labs  02/24/13 1019 04/07/13 1230  AST 16 20  ALT 9 11  ALKPHOS 81 88  BILITOT 0.6 0.4  PROT 5.7* 6.4   No results found for this basename: LIPASE, AMYLASE,  in the last 8760 hours No results found for this basename: AMMONIA,  in the last 8760 hours CBC:  Recent Labs  04/07/13 1230  WBC 4.8  NEUTROABS 3.3  HGB 13.6  HCT 41.9  MCV 94  PLT 126*   Lipid Panel:  Recent Labs  02/24/13 1019  HDL 60  LDLCALC 76  TRIG 51  CHOLHDL 2.4   TSH:  Recent Labs  04/07/13 1230  TSH 1.260    Assessment/Plan   1. Essential hypertension, benign -not currently on medications, blood pressure controlled at visit   2. ESRD on dialysis -[facility] dialysis MWF -conts on phosLo with meals and rocaltrol twice daily  3. Gout, unspecified cause, unspecified chronicity, unspecified site -conts predinsone as needed for flares    4. Anemia in chronic kidney disease - CBC With differential/Platelet   5. Screening for lipid disorders - Lipid panel  6. Chronic back pain -with worsening pain, ortho managing pain medication and just increased frequency but he would like another option.  - Ambulatory referral to Orthopedics  To follow up in 6  months for EV with MMSE

## 2014-02-23 NOTE — Patient Instructions (Signed)
Will get blood work today  Please consider colorectal screening-- screening is recommended to START at the age of 25.

## 2014-02-24 LAB — BASIC METABOLIC PANEL
BUN/Creatinine Ratio: 4 — ABNORMAL LOW (ref 10–22)
BUN: 21 mg/dL (ref 8–27)
CALCIUM: 9.8 mg/dL (ref 8.6–10.2)
CHLORIDE: 92 mmol/L — AB (ref 97–108)
CO2: 28 mmol/L (ref 18–29)
Creatinine, Ser: 5.56 mg/dL — ABNORMAL HIGH (ref 0.76–1.27)
GFR calc Af Amer: 12 mL/min/{1.73_m2} — ABNORMAL LOW (ref >59)
GFR calc non Af Amer: 10 mL/min/{1.73_m2} — ABNORMAL LOW (ref >59)
Glucose: 81 mg/dL (ref 65–99)
POTASSIUM: 5 mmol/L (ref 3.5–5.2)
SODIUM: 141 mmol/L (ref 134–144)

## 2014-02-24 LAB — CBC WITH DIFFERENTIAL
BASOS ABS: 0 10*3/uL (ref 0.0–0.2)
Basos: 0 %
Eos: 1 %
Eosinophils Absolute: 0 10*3/uL (ref 0.0–0.4)
HCT: 39.8 % (ref 37.5–51.0)
Hemoglobin: 13.3 g/dL (ref 12.6–17.7)
IMMATURE GRANULOCYTES: 0 %
Immature Grans (Abs): 0 10*3/uL (ref 0.0–0.1)
Lymphocytes Absolute: 0.8 10*3/uL (ref 0.7–3.1)
Lymphs: 16 %
MCH: 31.1 pg (ref 26.6–33.0)
MCHC: 33.4 g/dL (ref 31.5–35.7)
MCV: 93 fL (ref 79–97)
MONOS ABS: 0.5 10*3/uL (ref 0.1–0.9)
Monocytes: 10 %
NEUTROS PCT: 73 %
Neutrophils Absolute: 3.4 10*3/uL (ref 1.4–7.0)
PLATELETS: 163 10*3/uL (ref 150–379)
RBC: 4.28 x10E6/uL (ref 4.14–5.80)
RDW: 14.4 % (ref 12.3–15.4)
WBC: 4.7 10*3/uL (ref 3.4–10.8)

## 2014-02-24 LAB — LIPID PANEL
Chol/HDL Ratio: 3.4 ratio units (ref 0.0–5.0)
Cholesterol, Total: 218 mg/dL — ABNORMAL HIGH (ref 100–199)
HDL: 64 mg/dL (ref >39)
LDL Calculated: 134 mg/dL — ABNORMAL HIGH (ref 0–99)
Triglycerides: 100 mg/dL (ref 0–149)
VLDL CHOLESTEROL CAL: 20 mg/dL (ref 5–40)

## 2014-03-02 ENCOUNTER — Ambulatory Visit: Payer: Medicare Other | Admitting: Physician Assistant

## 2014-03-04 DIAGNOSIS — N186 End stage renal disease: Secondary | ICD-10-CM | POA: Diagnosis not present

## 2014-03-04 DIAGNOSIS — Z992 Dependence on renal dialysis: Secondary | ICD-10-CM | POA: Diagnosis not present

## 2014-03-06 DIAGNOSIS — N186 End stage renal disease: Secondary | ICD-10-CM | POA: Diagnosis not present

## 2014-03-06 DIAGNOSIS — N2581 Secondary hyperparathyroidism of renal origin: Secondary | ICD-10-CM | POA: Diagnosis not present

## 2014-03-06 DIAGNOSIS — D631 Anemia in chronic kidney disease: Secondary | ICD-10-CM | POA: Diagnosis not present

## 2014-03-06 NOTE — Progress Notes (Signed)
HPI: 64 year old male for preoperative evaluation prior to PV surgery. Echocardiogram August 2014 showed an ejection fraction of 99991111, grade 1 diastolic dysfunction, moderate left ventricular hypertrophy, mild left atrial enlargement, mild mitral regurgitation, severe right atrial and right ventricular enlargement with severely reduced RV function. CT July 2015 showed aneurysmal dilatation of the right and left common iliac arteries measuring 2.7 and 3.6 cm. There was also note of aneurysms involving the superior mesenteric artery. The pancreatic duct dilated. We were asked to evaluate prior to repair of iliac aneurysms. Patient does have dyspnea on exertion. There is no orthopnea, PND, pedal edema, chest pain or syncope. He does not recall having the above echocardiogram.  Current Outpatient Prescriptions  Medication Sig Dispense Refill  . calcitRIOL (ROCALTROL) 0.25 MCG capsule Take 0.25 mcg by mouth 2 (two) times daily.     . calcium acetate (PHOSLO) 667 MG capsule Take 1,334 mg by mouth 2 (two) times daily with a meal.    . oxyCODONE-acetaminophen (PERCOCET) 10-325 MG per tablet Take 1 tablet by mouth 3 (three) times daily.    . predniSONE (DELTASONE) 50 MG tablet Take 50 mg by mouth daily as needed (GOUT).     No current facility-administered medications for this visit.    No Known Allergies   Past Medical History  Diagnosis Date  . End stage renal disease   . Hypertension   . CHF (congestive heart failure)   . Arthritis     gouty  . Chronic back pain   . Heart failure with reduced ejection fraction   . Anemia in chronic kidney disease     Past Surgical History  Procedure Laterality Date  . Av fistula placement Right 07/26/2009  . Ligation of arteriovenous  fistula Right [id number]    Procedure: PLICATION OF ARTERIOVENOUS  FISTULA;  Surgeon: Elam Dutch, MD;  Location: [place];  Service: Vascular;  Laterality: Right;  . Insertion of dialysis catheter N/A 02/22/2013      Procedure: INSERTION OF DIALYSIS CATHETER;  Surgeon: Elam Dutch, MD;  Location: [place];  Service: Vascular;  Laterality: N/A;    History   Social History  . Marital Status: Married    Spouse Name: N/A    Number of Children: 2  . Years of Education: N/A   Occupational History  . Retired    Social History Main Topics  . Smoking status: Never Smoker   . Smokeless tobacco: Never Used  . Alcohol Use: No  . Drug Use: No  . Sexual Activity: Not on file   Other Topics Concern  . Not on file   Social History Narrative    Family History  Problem Relation Age of Onset  . Diabetes Father   . Hypertension Father   . Diabetes Sister     ROS: no fevers or chills, productive cough, hemoptysis, dysphasia, odynophagia, melena, hematochezia, dysuria, hematuria, rash, seizure activity, orthopnea, PND, pedal edema, claudication. Remaining systems are negative.  Physical Exam:   Blood pressure 80/40, pulse 73, height 6\' 4"  (1.93 m), weight 174 lb 8 oz (79.153 kg).  General:  Well developed/well nourished in NAD Skin warm/dry Patient not depressed No peripheral clubbing Back-normal HEENT-normal/normal eyelids Neck supple/normal carotid upstroke bilaterally; no bruits; no JVD; no thyromegaly chest - CTA/ normal expansion CV - RRR/normal S1 and S2; no rubs or gallops;  PMI nondisplaced, 2/6 systolic murmur apex and 2/6 systolic murmur left sternal border Abdomen -NT/ND, no HSM, no mass, + bowel sounds, no  bruit 2+ femoral pulses, no bruits Ext-no edema, chords; Diminished distal pulses. AV fistula right upper extremity. Neuro-grossly nonfocal  ECG Normal sinus rhythm at a rate of 73. Nonspecific T-wave changes.

## 2014-03-07 ENCOUNTER — Telehealth: Payer: Self-pay | Admitting: *Deleted

## 2014-03-07 NOTE — Telephone Encounter (Signed)
Sent high cholesterol diet paperwork in the mail for patient

## 2014-03-07 NOTE — Telephone Encounter (Signed)
-----   Message from Lauree Chandler, NP sent at 03/02/2014  4:48 PM EDT ----- Pt with high cholesterol, please have pt to watch fat intake, needs to follow low fat diet, watch fried foods and desserts. May come to the office and pick up additional information if needed. Increase cardiovascular activity as tolerates. Will follow up cholesterol at upcoming visit

## 2014-03-09 ENCOUNTER — Encounter: Payer: Self-pay | Admitting: Cardiology

## 2014-03-09 ENCOUNTER — Encounter: Payer: Self-pay | Admitting: *Deleted

## 2014-03-09 ENCOUNTER — Ambulatory Visit (INDEPENDENT_AMBULATORY_CARE_PROVIDER_SITE_OTHER): Payer: Medicare Other | Admitting: Cardiology

## 2014-03-09 VITALS — BP 80/40 | HR 73 | Ht 76.0 in | Wt 174.5 lb

## 2014-03-09 DIAGNOSIS — I509 Heart failure, unspecified: Secondary | ICD-10-CM

## 2014-03-09 DIAGNOSIS — Z01818 Encounter for other preprocedural examination: Secondary | ICD-10-CM | POA: Diagnosis not present

## 2014-03-09 DIAGNOSIS — N185 Chronic kidney disease, stage 5: Secondary | ICD-10-CM

## 2014-03-09 DIAGNOSIS — Z01810 Encounter for preprocedural cardiovascular examination: Secondary | ICD-10-CM | POA: Diagnosis not present

## 2014-03-09 DIAGNOSIS — I10 Essential (primary) hypertension: Secondary | ICD-10-CM

## 2014-03-09 DIAGNOSIS — I723 Aneurysm of iliac artery: Secondary | ICD-10-CM | POA: Diagnosis not present

## 2014-03-09 DIAGNOSIS — I5020 Unspecified systolic (congestive) heart failure: Secondary | ICD-10-CM

## 2014-03-09 NOTE — Patient Instructions (Signed)
Your physician recommends that you schedule a follow-up appointment in: [place] physician has requested that you have an echocardiogram. Echocardiography is a painless test that uses sound waves to create images of your heart. It provides your doctor with information about the size and shape of your heart and how well your heart's chambers and valves are working. This procedure takes approximately one hour. There are no restrictions for this procedure.

## 2014-03-09 NOTE — Telephone Encounter (Signed)
Patient called wanting results. Given to patient and he agreed.

## 2014-03-09 NOTE — Assessment & Plan Note (Signed)
Management per vascular surgery. 

## 2014-03-09 NOTE — Assessment & Plan Note (Signed)
Blood pressure now runs low after dialysis has been initiated. All medications have been discontinued.

## 2014-03-09 NOTE — Assessment & Plan Note (Addendum)
Previous echocardiogram showed reduced LV function. This apparently was not evaluated. He does not have a history of alcohol use. He did have significant hypertension at the time which could certainly explain. His blood pressure is much improved after initiation of dialysis and now runs low. Plan to repeat echocardiogram. Hopefully LV function will have normalized. If not he will require cardiac catheterization to exclude coronary disease. The risks and benefits were discussed and he agrees to proceed. Blood pressure will not allow beta blocker or ACE inhibitor.

## 2014-03-09 NOTE — Addendum Note (Signed)
Addended by: Lelon Perla on: 03/09/2014 10:42 AM   Modules accepted: Level of Service

## 2014-03-09 NOTE — Assessment & Plan Note (Signed)
Patient presents for preoperative evaluation prior to peripheral vascular surgery. His previous ejection fraction was reduced. Repeat echocardiogram. If LV function remains decreased will need cardiac catheterization. If it has normalized after blood pressure control we will plan a nuclear study for risk stratification. Further recommendations once echocardiogram reviewed.

## 2014-03-09 NOTE — Assessment & Plan Note (Signed)
- 

## 2014-03-10 ENCOUNTER — Ambulatory Visit: Payer: Medicare Other | Admitting: Cardiology

## 2014-03-10 ENCOUNTER — Other Ambulatory Visit (HOSPITAL_COMMUNITY): Payer: Medicare Other

## 2014-03-10 ENCOUNTER — Other Ambulatory Visit (HOSPITAL_COMMUNITY): Payer: Self-pay | Admitting: Cardiology

## 2014-03-10 ENCOUNTER — Ambulatory Visit (HOSPITAL_COMMUNITY): Payer: Medicare Other

## 2014-03-10 DIAGNOSIS — I10 Essential (primary) hypertension: Secondary | ICD-10-CM

## 2014-03-10 DIAGNOSIS — Z01818 Encounter for other preprocedural examination: Secondary | ICD-10-CM

## 2014-03-13 ENCOUNTER — Encounter (HOSPITAL_COMMUNITY): Payer: Self-pay | Admitting: Cardiology

## 2014-03-13 ENCOUNTER — Telehealth (HOSPITAL_COMMUNITY): Payer: Self-pay | Admitting: *Deleted

## 2014-03-13 ENCOUNTER — Inpatient Hospital Stay (HOSPITAL_COMMUNITY): Admission: RE | Admit: 2014-03-13 | Payer: Medicare Other | Source: Ambulatory Visit

## 2014-03-13 MED ORDER — VANCOMYCIN HCL IN DEXTROSE 1-5 GM/200ML-% IV SOLN
1000.0000 mg | INTRAVENOUS | Status: DC
Start: 2014-03-13 — End: 2017-03-10

## 2014-03-13 NOTE — Progress Notes (Signed)
Anesthesia follow-up: See my note from 02/13/14.  Procedure was postponed due to need for cardiology preoperative evaluation.  He was seen by cardiologist Dr. Stanford Breed on 03/09/14.  According to his note, "Repeat echocardiogram. If LV function remains decreased will need cardiac catheterization. If it has normalized after blood pressure control we will plan a nuclear study for risk stratification. Further recommendations once echocardiogram reviewed." He has been hypotensive since starting hemodialysis, so "Blood pressure will not allow beta blocker or ACE inhibitor." His BP at [facility] on 03/09/14 was 80/40, but 116/74 on 02/23/14 at [facility].  Patient is scheduled to be a same day work-up for unclear reasons.   EKG on 03/09/14 showed NSR, T wave abnormality (primarily in inferior leads). Dr. Stanford Breed thought changes were non-specific.  Patient was suppose to have an echo done today, but canceled the appointment (he is in hemodialysis this afternoon).  I spoke with Colletta Maryland, RN at [facility].  Patient's surgery will be postponed again until he is cleared by cardiology. She thinks it may not be rescheduled until December 2015.    [facility] Short Stay Center/Anesthesiology Phone [phone] 03/13/2014 1:49 PM

## 2014-03-14 SURGERY — EMBOLIZATION
Anesthesia: General | Laterality: Left

## 2014-03-16 ENCOUNTER — Ambulatory Visit (HOSPITAL_COMMUNITY): Payer: Medicare Other

## 2014-03-16 ENCOUNTER — Ambulatory Visit (HOSPITAL_COMMUNITY)
Admission: RE | Admit: 2014-03-16 | Discharge: 2014-03-16 | Disposition: A | Discharge status: Home / Self Care | Payer: Medicare Other | Source: Ambulatory Visit | Attending: Cardiology | Admitting: Cardiology

## 2014-03-16 DIAGNOSIS — I517 Cardiomegaly: Secondary | ICD-10-CM

## 2014-03-16 DIAGNOSIS — Z01818 Encounter for other preprocedural examination: Secondary | ICD-10-CM | POA: Insufficient documentation

## 2014-03-16 DIAGNOSIS — I10 Essential (primary) hypertension: Secondary | ICD-10-CM | POA: Insufficient documentation

## 2014-03-16 NOTE — Progress Notes (Signed)
2D Echo Performed 03/16/2014    Marygrace Drought, RCS

## 2014-03-21 DIAGNOSIS — M5136 Other intervertebral disc degeneration, lumbar region: Secondary | ICD-10-CM | POA: Diagnosis not present

## 2014-03-21 DIAGNOSIS — Z79891 Long term (current) use of opiate analgesic: Secondary | ICD-10-CM | POA: Diagnosis not present

## 2014-03-21 DIAGNOSIS — G894 Chronic pain syndrome: Secondary | ICD-10-CM | POA: Diagnosis not present

## 2014-03-23 ENCOUNTER — Telehealth: Payer: Self-pay | Admitting: Cardiology

## 2014-03-23 NOTE — Telephone Encounter (Signed)
Have been trying to reach the patient, he will need nuclear stress test to clear.

## 2014-03-23 NOTE — Telephone Encounter (Signed)
Pt need clarence for surgery.He is having a coil embolization and stent graft of his left iliac artery in December. Please fax or place in [place]. Fax [phone]

## 2014-03-27 ENCOUNTER — Other Ambulatory Visit: Payer: Self-pay | Admitting: *Deleted

## 2014-03-27 DIAGNOSIS — Z01810 Encounter for preprocedural cardiovascular examination: Secondary | ICD-10-CM

## 2014-03-27 DIAGNOSIS — I10 Essential (primary) hypertension: Secondary | ICD-10-CM

## 2014-03-27 NOTE — Telephone Encounter (Signed)
Returning your call. °

## 2014-03-28 ENCOUNTER — Inpatient Hospital Stay (HOSPITAL_COMMUNITY): Admission: RE | Admit: 2014-03-28 | Payer: Medicare Other | Source: Ambulatory Visit | Admitting: Vascular Surgery

## 2014-03-28 NOTE — Telephone Encounter (Signed)
Spoke with pt, nuclear test scheduled.

## 2014-04-03 DIAGNOSIS — N186 End stage renal disease: Secondary | ICD-10-CM | POA: Diagnosis not present

## 2014-04-03 DIAGNOSIS — Z992 Dependence on renal dialysis: Secondary | ICD-10-CM | POA: Diagnosis not present

## 2014-04-05 ENCOUNTER — Telehealth (HOSPITAL_COMMUNITY): Payer: Self-pay

## 2014-04-05 DIAGNOSIS — N186 End stage renal disease: Secondary | ICD-10-CM | POA: Diagnosis not present

## 2014-04-05 DIAGNOSIS — D631 Anemia in chronic kidney disease: Secondary | ICD-10-CM | POA: Diagnosis not present

## 2014-04-05 DIAGNOSIS — N2581 Secondary hyperparathyroidism of renal origin: Secondary | ICD-10-CM | POA: Diagnosis not present

## 2014-04-05 NOTE — Telephone Encounter (Signed)
Encounter complete. 

## 2014-04-06 ENCOUNTER — Ambulatory Visit (HOSPITAL_COMMUNITY)
Admission: RE | Admit: 2014-04-06 | Discharge: 2014-04-06 | Disposition: A | Discharge status: Home / Self Care | Payer: Medicare Other | Source: Ambulatory Visit | Attending: Cardiovascular Disease | Admitting: Cardiovascular Disease

## 2014-04-06 DIAGNOSIS — Z01810 Encounter for preprocedural cardiovascular examination: Secondary | ICD-10-CM

## 2014-04-06 DIAGNOSIS — I739 Peripheral vascular disease, unspecified: Secondary | ICD-10-CM | POA: Diagnosis not present

## 2014-04-06 DIAGNOSIS — N186 End stage renal disease: Secondary | ICD-10-CM | POA: Diagnosis not present

## 2014-04-06 DIAGNOSIS — I509 Heart failure, unspecified: Secondary | ICD-10-CM | POA: Insufficient documentation

## 2014-04-06 DIAGNOSIS — I10 Essential (primary) hypertension: Secondary | ICD-10-CM | POA: Insufficient documentation

## 2014-04-06 DIAGNOSIS — R0609 Other forms of dyspnea: Secondary | ICD-10-CM | POA: Insufficient documentation

## 2014-04-06 MED ORDER — REGADENOSON 0.4 MG/5ML IV SOLN
0.4000 mg | Freq: Once | INTRAVENOUS | Status: AC
  Administered 2014-04-06: 0.4 mg via INTRAVENOUS

## 2014-04-06 MED ORDER — TECHNETIUM TC 99M SESTAMIBI GENERIC - CARDIOLITE
30.9000 | Freq: Once | INTRAVENOUS | Status: AC | PRN
  Administered 2014-04-06: 30.9 via INTRAVENOUS

## 2014-04-06 MED ORDER — TECHNETIUM TC 99M SESTAMIBI GENERIC - CARDIOLITE
10.8000 | Freq: Once | INTRAVENOUS | Status: AC | PRN
  Administered 2014-04-06: 11 via INTRAVENOUS

## 2014-04-06 MED ORDER — AMINOPHYLLINE 25 MG/ML IV SOLN
75.0000 mg | Freq: Once | INTRAVENOUS | Status: AC
  Administered 2014-04-06: 75 mg via INTRAVENOUS

## 2014-04-06 NOTE — Procedures (Addendum)
[facility] [address] [place] [id number]  Cardiology Nuclear Med Study  Gregory [name] is a 63 y.o. male     MRN : [id number]     DOB: 07-20-1950  Procedure Date: 04/06/2014  Nuclear Med Background Indication for Stress Test:  Surgical Clearance History:  CHF;End stage renal disease;No prior NUC MPI for comparison;ECHO in 12/2012=EF=30-35% Cardiac Risk Factors: Hypertension, PVD and OV 03/09/2014-BP=80/40  Symptoms:  DOE   Nuclear Pre-Procedure Caffeine/Decaff Intake:  1:00am NPO After: 11am   IV Site: L Forearm  IV 0.9% NS with Angio Cath:  22g  Chest Size (in):  42"  IV Started by: Rolene Course, RN  Height: 6\' 4"  (1.93 m)  Cup Size: n/a  BMI:  Body mass index is 21.19 kg/(m^2). Weight:  174 lb (78.926 kg)   Tech Comments:  Right AV fistula;No B/P in right arm.    Nuclear Med Study 1 or 2 day study: 1 day  Stress Test Type:  [place]  Order Authorizing Provider:  Kirk Ruths, MD   Resting Radionuclide: Technetium 7m Sestamibi  Resting Radionuclide Dose: 10.8 mCi   Stress Radionuclide:  Technetium 82m Sestamibi  Stress Radionuclide Dose: 30.9 mCi           Stress Protocol Rest HR: 64 Stress HR:77  Rest BP: 105/83 Stress BP: 107/76  Exercise Time (min): n/a METS: n/a          Dose of Adenosine (mg):  n/a Dose of Lexiscan: 0.4 mg  Dose of Atropine (mg): n/a Dose of Dobutamine: n/a mcg/kg/min (at max HR)  Stress Test Technologist: Mellody Memos, CCT Nuclear Technologist: Imagene Riches, CNMT   Rest Procedure:  Myocardial perfusion imaging was performed at rest 45 minutes following the intravenous administration of Technetium 77m Sestamibi. Stress Procedure:  The patient received IV Lexiscan 0.4 mg over 15-seconds.  Technetium 55m Sestamibi injected IV at 30-seconds.  Patient experienced a drop in blood pressure and was administered 75 mg of Aminophylline IV. There were no significant changes  with Lexiscan.  Quantitative spect images were obtained after a 45 minute delay.  Transient Ischemic Dilatation (Normal <1.22):  1.05  QGS EDV:  90 ml QGS ESV:  28 ml LV Ejection Fraction: 68%     Rest ECG: NSR - Normal EKG  Stress ECG: No significant change from baseline ECG  QPS Raw Data Images:  Normal; no motion artifact; normal heart/lung ratio. Stress Images:  Normal homogeneous uptake in all areas of the myocardium. Rest Images:  Normal homogeneous uptake in all areas of the myocardium. Subtraction (SDS):  No evidence of ischemia. LV Wall Motion:  NL LV Function; NL Wall Motion  Impression Exercise Capacity:  Lexiscan with no exercise. BP Response:  Normal blood pressure response. Clinical Symptoms:  No significant symptoms noted. ECG Impression:  No significant ECG changes with Lexiscan. Comparison with Prior Nuclear Study: No previous nuclear study performed   Overall Impression:  Normal stress nuclear study.   LVEF is substantially improved compared to 12/2012 echo and similar to more recent 11//2015 echo.   Sanda Klein, MD  04/06/2014 5:39 PM

## 2014-04-18 DIAGNOSIS — G894 Chronic pain syndrome: Secondary | ICD-10-CM | POA: Diagnosis not present

## 2014-04-18 DIAGNOSIS — M5136 Other intervertebral disc degeneration, lumbar region: Secondary | ICD-10-CM | POA: Diagnosis not present

## 2014-04-18 DIAGNOSIS — Z79891 Long term (current) use of opiate analgesic: Secondary | ICD-10-CM | POA: Diagnosis not present

## 2014-05-04 DIAGNOSIS — N186 End stage renal disease: Secondary | ICD-10-CM | POA: Diagnosis not present

## 2014-05-04 DIAGNOSIS — Z992 Dependence on renal dialysis: Secondary | ICD-10-CM | POA: Diagnosis not present

## 2014-05-06 DIAGNOSIS — N2581 Secondary hyperparathyroidism of renal origin: Secondary | ICD-10-CM | POA: Diagnosis not present

## 2014-05-06 DIAGNOSIS — D631 Anemia in chronic kidney disease: Secondary | ICD-10-CM | POA: Diagnosis not present

## 2014-05-06 DIAGNOSIS — N186 End stage renal disease: Secondary | ICD-10-CM | POA: Diagnosis not present

## 2014-05-08 DIAGNOSIS — D631 Anemia in chronic kidney disease: Secondary | ICD-10-CM | POA: Diagnosis not present

## 2014-05-08 DIAGNOSIS — N186 End stage renal disease: Secondary | ICD-10-CM | POA: Diagnosis not present

## 2014-05-08 DIAGNOSIS — N2581 Secondary hyperparathyroidism of renal origin: Secondary | ICD-10-CM | POA: Diagnosis not present

## 2014-05-10 DIAGNOSIS — N186 End stage renal disease: Secondary | ICD-10-CM | POA: Diagnosis not present

## 2014-05-10 DIAGNOSIS — D631 Anemia in chronic kidney disease: Secondary | ICD-10-CM | POA: Diagnosis not present

## 2014-05-10 DIAGNOSIS — N2581 Secondary hyperparathyroidism of renal origin: Secondary | ICD-10-CM | POA: Diagnosis not present

## 2014-05-11 ENCOUNTER — Telehealth: Payer: Self-pay

## 2014-05-11 NOTE — Telephone Encounter (Signed)
Called patient to reschedule coil embolization/stent graft with Dr. Oneida Alar. No answer; left message for patient to call the office.

## 2014-05-11 NOTE — Telephone Encounter (Signed)
Patient left a message on this nurse's voicemail stating that he "chooses to wait on surgery and will call back."

## 2014-05-12 DIAGNOSIS — N186 End stage renal disease: Secondary | ICD-10-CM | POA: Diagnosis not present

## 2014-05-12 DIAGNOSIS — N2581 Secondary hyperparathyroidism of renal origin: Secondary | ICD-10-CM | POA: Diagnosis not present

## 2014-05-12 DIAGNOSIS — D631 Anemia in chronic kidney disease: Secondary | ICD-10-CM | POA: Diagnosis not present

## 2014-05-15 DIAGNOSIS — D631 Anemia in chronic kidney disease: Secondary | ICD-10-CM | POA: Diagnosis not present

## 2014-05-15 DIAGNOSIS — N2581 Secondary hyperparathyroidism of renal origin: Secondary | ICD-10-CM | POA: Diagnosis not present

## 2014-05-15 DIAGNOSIS — N186 End stage renal disease: Secondary | ICD-10-CM | POA: Diagnosis not present

## 2014-05-17 DIAGNOSIS — N2581 Secondary hyperparathyroidism of renal origin: Secondary | ICD-10-CM | POA: Diagnosis not present

## 2014-05-17 DIAGNOSIS — N186 End stage renal disease: Secondary | ICD-10-CM | POA: Diagnosis not present

## 2014-05-17 DIAGNOSIS — D631 Anemia in chronic kidney disease: Secondary | ICD-10-CM | POA: Diagnosis not present

## 2014-05-18 ENCOUNTER — Ambulatory Visit: Payer: Medicare Other | Admitting: Cardiology

## 2014-05-19 DIAGNOSIS — N2581 Secondary hyperparathyroidism of renal origin: Secondary | ICD-10-CM | POA: Diagnosis not present

## 2014-05-19 DIAGNOSIS — N186 End stage renal disease: Secondary | ICD-10-CM | POA: Diagnosis not present

## 2014-05-19 DIAGNOSIS — D631 Anemia in chronic kidney disease: Secondary | ICD-10-CM | POA: Diagnosis not present

## 2014-05-22 DIAGNOSIS — N186 End stage renal disease: Secondary | ICD-10-CM | POA: Diagnosis not present

## 2014-05-22 DIAGNOSIS — N2581 Secondary hyperparathyroidism of renal origin: Secondary | ICD-10-CM | POA: Diagnosis not present

## 2014-05-22 DIAGNOSIS — D631 Anemia in chronic kidney disease: Secondary | ICD-10-CM | POA: Diagnosis not present

## 2014-05-24 DIAGNOSIS — D631 Anemia in chronic kidney disease: Secondary | ICD-10-CM | POA: Diagnosis not present

## 2014-05-24 DIAGNOSIS — N186 End stage renal disease: Secondary | ICD-10-CM | POA: Diagnosis not present

## 2014-05-24 DIAGNOSIS — N2581 Secondary hyperparathyroidism of renal origin: Secondary | ICD-10-CM | POA: Diagnosis not present

## 2014-05-26 DIAGNOSIS — D631 Anemia in chronic kidney disease: Secondary | ICD-10-CM | POA: Diagnosis not present

## 2014-05-26 DIAGNOSIS — N2581 Secondary hyperparathyroidism of renal origin: Secondary | ICD-10-CM | POA: Diagnosis not present

## 2014-05-26 DIAGNOSIS — N186 End stage renal disease: Secondary | ICD-10-CM | POA: Diagnosis not present

## 2014-05-29 DIAGNOSIS — N186 End stage renal disease: Secondary | ICD-10-CM | POA: Diagnosis not present

## 2014-05-29 DIAGNOSIS — D631 Anemia in chronic kidney disease: Secondary | ICD-10-CM | POA: Diagnosis not present

## 2014-05-29 DIAGNOSIS — N2581 Secondary hyperparathyroidism of renal origin: Secondary | ICD-10-CM | POA: Diagnosis not present

## 2014-05-31 DIAGNOSIS — N2581 Secondary hyperparathyroidism of renal origin: Secondary | ICD-10-CM | POA: Diagnosis not present

## 2014-05-31 DIAGNOSIS — D631 Anemia in chronic kidney disease: Secondary | ICD-10-CM | POA: Diagnosis not present

## 2014-05-31 DIAGNOSIS — N186 End stage renal disease: Secondary | ICD-10-CM | POA: Diagnosis not present

## 2014-06-02 DIAGNOSIS — N2581 Secondary hyperparathyroidism of renal origin: Secondary | ICD-10-CM | POA: Diagnosis not present

## 2014-06-02 DIAGNOSIS — N186 End stage renal disease: Secondary | ICD-10-CM | POA: Diagnosis not present

## 2014-06-02 DIAGNOSIS — D631 Anemia in chronic kidney disease: Secondary | ICD-10-CM | POA: Diagnosis not present

## 2014-06-04 DIAGNOSIS — Z992 Dependence on renal dialysis: Secondary | ICD-10-CM | POA: Diagnosis not present

## 2014-06-04 DIAGNOSIS — N186 End stage renal disease: Secondary | ICD-10-CM | POA: Diagnosis not present

## 2014-06-05 DIAGNOSIS — D631 Anemia in chronic kidney disease: Secondary | ICD-10-CM | POA: Diagnosis not present

## 2014-06-05 DIAGNOSIS — N2581 Secondary hyperparathyroidism of renal origin: Secondary | ICD-10-CM | POA: Diagnosis not present

## 2014-06-05 DIAGNOSIS — N186 End stage renal disease: Secondary | ICD-10-CM | POA: Diagnosis not present

## 2014-06-07 DIAGNOSIS — D631 Anemia in chronic kidney disease: Secondary | ICD-10-CM | POA: Diagnosis not present

## 2014-06-07 DIAGNOSIS — N2581 Secondary hyperparathyroidism of renal origin: Secondary | ICD-10-CM | POA: Diagnosis not present

## 2014-06-07 DIAGNOSIS — N186 End stage renal disease: Secondary | ICD-10-CM | POA: Diagnosis not present

## 2014-06-09 DIAGNOSIS — D631 Anemia in chronic kidney disease: Secondary | ICD-10-CM | POA: Diagnosis not present

## 2014-06-09 DIAGNOSIS — N186 End stage renal disease: Secondary | ICD-10-CM | POA: Diagnosis not present

## 2014-06-09 DIAGNOSIS — N2581 Secondary hyperparathyroidism of renal origin: Secondary | ICD-10-CM | POA: Diagnosis not present

## 2014-06-12 DIAGNOSIS — N2581 Secondary hyperparathyroidism of renal origin: Secondary | ICD-10-CM | POA: Diagnosis not present

## 2014-06-12 DIAGNOSIS — D631 Anemia in chronic kidney disease: Secondary | ICD-10-CM | POA: Diagnosis not present

## 2014-06-12 DIAGNOSIS — N186 End stage renal disease: Secondary | ICD-10-CM | POA: Diagnosis not present

## 2014-06-14 DIAGNOSIS — N186 End stage renal disease: Secondary | ICD-10-CM | POA: Diagnosis not present

## 2014-06-14 DIAGNOSIS — D631 Anemia in chronic kidney disease: Secondary | ICD-10-CM | POA: Diagnosis not present

## 2014-06-14 DIAGNOSIS — N2581 Secondary hyperparathyroidism of renal origin: Secondary | ICD-10-CM | POA: Diagnosis not present

## 2014-06-16 DIAGNOSIS — N186 End stage renal disease: Secondary | ICD-10-CM | POA: Diagnosis not present

## 2014-06-16 DIAGNOSIS — D631 Anemia in chronic kidney disease: Secondary | ICD-10-CM | POA: Diagnosis not present

## 2014-06-16 DIAGNOSIS — N2581 Secondary hyperparathyroidism of renal origin: Secondary | ICD-10-CM | POA: Diagnosis not present

## 2014-06-19 DIAGNOSIS — D631 Anemia in chronic kidney disease: Secondary | ICD-10-CM | POA: Diagnosis not present

## 2014-06-19 DIAGNOSIS — N186 End stage renal disease: Secondary | ICD-10-CM | POA: Diagnosis not present

## 2014-06-19 DIAGNOSIS — N2581 Secondary hyperparathyroidism of renal origin: Secondary | ICD-10-CM | POA: Diagnosis not present

## 2014-06-21 DIAGNOSIS — N186 End stage renal disease: Secondary | ICD-10-CM | POA: Diagnosis not present

## 2014-06-21 DIAGNOSIS — N2581 Secondary hyperparathyroidism of renal origin: Secondary | ICD-10-CM | POA: Diagnosis not present

## 2014-06-21 DIAGNOSIS — D631 Anemia in chronic kidney disease: Secondary | ICD-10-CM | POA: Diagnosis not present

## 2014-06-23 DIAGNOSIS — N2581 Secondary hyperparathyroidism of renal origin: Secondary | ICD-10-CM | POA: Diagnosis not present

## 2014-06-23 DIAGNOSIS — D631 Anemia in chronic kidney disease: Secondary | ICD-10-CM | POA: Diagnosis not present

## 2014-06-23 DIAGNOSIS — N186 End stage renal disease: Secondary | ICD-10-CM | POA: Diagnosis not present

## 2014-06-26 DIAGNOSIS — D631 Anemia in chronic kidney disease: Secondary | ICD-10-CM | POA: Diagnosis not present

## 2014-06-26 DIAGNOSIS — N186 End stage renal disease: Secondary | ICD-10-CM | POA: Diagnosis not present

## 2014-06-26 DIAGNOSIS — N2581 Secondary hyperparathyroidism of renal origin: Secondary | ICD-10-CM | POA: Diagnosis not present

## 2014-06-28 DIAGNOSIS — N186 End stage renal disease: Secondary | ICD-10-CM | POA: Diagnosis not present

## 2014-06-28 DIAGNOSIS — D631 Anemia in chronic kidney disease: Secondary | ICD-10-CM | POA: Diagnosis not present

## 2014-06-28 DIAGNOSIS — N2581 Secondary hyperparathyroidism of renal origin: Secondary | ICD-10-CM | POA: Diagnosis not present

## 2014-06-30 DIAGNOSIS — N186 End stage renal disease: Secondary | ICD-10-CM | POA: Diagnosis not present

## 2014-06-30 DIAGNOSIS — N2581 Secondary hyperparathyroidism of renal origin: Secondary | ICD-10-CM | POA: Diagnosis not present

## 2014-06-30 DIAGNOSIS — D631 Anemia in chronic kidney disease: Secondary | ICD-10-CM | POA: Diagnosis not present

## 2014-07-03 DIAGNOSIS — N186 End stage renal disease: Secondary | ICD-10-CM | POA: Diagnosis not present

## 2014-07-03 DIAGNOSIS — N2581 Secondary hyperparathyroidism of renal origin: Secondary | ICD-10-CM | POA: Diagnosis not present

## 2014-07-03 DIAGNOSIS — Z992 Dependence on renal dialysis: Secondary | ICD-10-CM | POA: Diagnosis not present

## 2014-07-03 DIAGNOSIS — D631 Anemia in chronic kidney disease: Secondary | ICD-10-CM | POA: Diagnosis not present

## 2014-07-05 DIAGNOSIS — N186 End stage renal disease: Secondary | ICD-10-CM | POA: Diagnosis not present

## 2014-07-05 DIAGNOSIS — D631 Anemia in chronic kidney disease: Secondary | ICD-10-CM | POA: Diagnosis not present

## 2014-07-05 DIAGNOSIS — N2581 Secondary hyperparathyroidism of renal origin: Secondary | ICD-10-CM | POA: Diagnosis not present

## 2014-07-06 ENCOUNTER — Ambulatory Visit: Payer: Medicare Other | Admitting: Vascular Surgery

## 2014-07-07 DIAGNOSIS — N186 End stage renal disease: Secondary | ICD-10-CM | POA: Diagnosis not present

## 2014-07-07 DIAGNOSIS — N2581 Secondary hyperparathyroidism of renal origin: Secondary | ICD-10-CM | POA: Diagnosis not present

## 2014-07-07 DIAGNOSIS — D631 Anemia in chronic kidney disease: Secondary | ICD-10-CM | POA: Diagnosis not present

## 2014-07-10 DIAGNOSIS — D631 Anemia in chronic kidney disease: Secondary | ICD-10-CM | POA: Diagnosis not present

## 2014-07-10 DIAGNOSIS — N186 End stage renal disease: Secondary | ICD-10-CM | POA: Diagnosis not present

## 2014-07-10 DIAGNOSIS — N2581 Secondary hyperparathyroidism of renal origin: Secondary | ICD-10-CM | POA: Diagnosis not present

## 2014-07-11 DIAGNOSIS — G894 Chronic pain syndrome: Secondary | ICD-10-CM | POA: Diagnosis not present

## 2014-07-11 DIAGNOSIS — Z79891 Long term (current) use of opiate analgesic: Secondary | ICD-10-CM | POA: Diagnosis not present

## 2014-07-11 DIAGNOSIS — M5136 Other intervertebral disc degeneration, lumbar region: Secondary | ICD-10-CM | POA: Diagnosis not present

## 2014-07-12 DIAGNOSIS — D631 Anemia in chronic kidney disease: Secondary | ICD-10-CM | POA: Diagnosis not present

## 2014-07-12 DIAGNOSIS — N2581 Secondary hyperparathyroidism of renal origin: Secondary | ICD-10-CM | POA: Diagnosis not present

## 2014-07-12 DIAGNOSIS — N186 End stage renal disease: Secondary | ICD-10-CM | POA: Diagnosis not present

## 2014-07-14 DIAGNOSIS — N2581 Secondary hyperparathyroidism of renal origin: Secondary | ICD-10-CM | POA: Diagnosis not present

## 2014-07-14 DIAGNOSIS — N186 End stage renal disease: Secondary | ICD-10-CM | POA: Diagnosis not present

## 2014-07-14 DIAGNOSIS — D631 Anemia in chronic kidney disease: Secondary | ICD-10-CM | POA: Diagnosis not present

## 2014-07-17 DIAGNOSIS — D631 Anemia in chronic kidney disease: Secondary | ICD-10-CM | POA: Diagnosis not present

## 2014-07-17 DIAGNOSIS — N2581 Secondary hyperparathyroidism of renal origin: Secondary | ICD-10-CM | POA: Diagnosis not present

## 2014-07-17 DIAGNOSIS — N186 End stage renal disease: Secondary | ICD-10-CM | POA: Diagnosis not present

## 2014-07-19 DIAGNOSIS — N186 End stage renal disease: Secondary | ICD-10-CM | POA: Diagnosis not present

## 2014-07-19 DIAGNOSIS — D631 Anemia in chronic kidney disease: Secondary | ICD-10-CM | POA: Diagnosis not present

## 2014-07-19 DIAGNOSIS — N2581 Secondary hyperparathyroidism of renal origin: Secondary | ICD-10-CM | POA: Diagnosis not present

## 2014-07-21 DIAGNOSIS — N2581 Secondary hyperparathyroidism of renal origin: Secondary | ICD-10-CM | POA: Diagnosis not present

## 2014-07-21 DIAGNOSIS — D631 Anemia in chronic kidney disease: Secondary | ICD-10-CM | POA: Diagnosis not present

## 2014-07-21 DIAGNOSIS — N186 End stage renal disease: Secondary | ICD-10-CM | POA: Diagnosis not present

## 2014-07-24 DIAGNOSIS — N186 End stage renal disease: Secondary | ICD-10-CM | POA: Diagnosis not present

## 2014-07-24 DIAGNOSIS — N2581 Secondary hyperparathyroidism of renal origin: Secondary | ICD-10-CM | POA: Diagnosis not present

## 2014-07-24 DIAGNOSIS — D631 Anemia in chronic kidney disease: Secondary | ICD-10-CM | POA: Diagnosis not present

## 2014-07-26 DIAGNOSIS — N2581 Secondary hyperparathyroidism of renal origin: Secondary | ICD-10-CM | POA: Diagnosis not present

## 2014-07-26 DIAGNOSIS — D631 Anemia in chronic kidney disease: Secondary | ICD-10-CM | POA: Diagnosis not present

## 2014-07-26 DIAGNOSIS — N186 End stage renal disease: Secondary | ICD-10-CM | POA: Diagnosis not present

## 2014-07-28 DIAGNOSIS — D631 Anemia in chronic kidney disease: Secondary | ICD-10-CM | POA: Diagnosis not present

## 2014-07-28 DIAGNOSIS — N186 End stage renal disease: Secondary | ICD-10-CM | POA: Diagnosis not present

## 2014-07-28 DIAGNOSIS — N2581 Secondary hyperparathyroidism of renal origin: Secondary | ICD-10-CM | POA: Diagnosis not present

## 2014-07-31 DIAGNOSIS — N186 End stage renal disease: Secondary | ICD-10-CM | POA: Diagnosis not present

## 2014-07-31 DIAGNOSIS — N2581 Secondary hyperparathyroidism of renal origin: Secondary | ICD-10-CM | POA: Diagnosis not present

## 2014-07-31 DIAGNOSIS — D631 Anemia in chronic kidney disease: Secondary | ICD-10-CM | POA: Diagnosis not present

## 2014-08-02 DIAGNOSIS — N186 End stage renal disease: Secondary | ICD-10-CM | POA: Diagnosis not present

## 2014-08-02 DIAGNOSIS — N2581 Secondary hyperparathyroidism of renal origin: Secondary | ICD-10-CM | POA: Diagnosis not present

## 2014-08-02 DIAGNOSIS — D631 Anemia in chronic kidney disease: Secondary | ICD-10-CM | POA: Diagnosis not present

## 2014-08-03 DIAGNOSIS — I120 Hypertensive chronic kidney disease with stage 5 chronic kidney disease or end stage renal disease: Secondary | ICD-10-CM | POA: Diagnosis not present

## 2014-08-03 DIAGNOSIS — N186 End stage renal disease: Secondary | ICD-10-CM | POA: Diagnosis not present

## 2014-08-03 DIAGNOSIS — Z992 Dependence on renal dialysis: Secondary | ICD-10-CM | POA: Diagnosis not present

## 2014-08-04 DIAGNOSIS — N186 End stage renal disease: Secondary | ICD-10-CM | POA: Diagnosis not present

## 2014-08-04 DIAGNOSIS — N2581 Secondary hyperparathyroidism of renal origin: Secondary | ICD-10-CM | POA: Diagnosis not present

## 2014-08-04 DIAGNOSIS — D631 Anemia in chronic kidney disease: Secondary | ICD-10-CM | POA: Diagnosis not present

## 2014-08-07 DIAGNOSIS — N2581 Secondary hyperparathyroidism of renal origin: Secondary | ICD-10-CM | POA: Diagnosis not present

## 2014-08-07 DIAGNOSIS — D631 Anemia in chronic kidney disease: Secondary | ICD-10-CM | POA: Diagnosis not present

## 2014-08-07 DIAGNOSIS — N186 End stage renal disease: Secondary | ICD-10-CM | POA: Diagnosis not present

## 2014-08-09 DIAGNOSIS — N186 End stage renal disease: Secondary | ICD-10-CM | POA: Diagnosis not present

## 2014-08-09 DIAGNOSIS — N2581 Secondary hyperparathyroidism of renal origin: Secondary | ICD-10-CM | POA: Diagnosis not present

## 2014-08-09 DIAGNOSIS — D631 Anemia in chronic kidney disease: Secondary | ICD-10-CM | POA: Diagnosis not present

## 2014-08-10 ENCOUNTER — Ambulatory Visit: Payer: Medicare Other | Admitting: Vascular Surgery

## 2014-08-11 DIAGNOSIS — N2581 Secondary hyperparathyroidism of renal origin: Secondary | ICD-10-CM | POA: Diagnosis not present

## 2014-08-11 DIAGNOSIS — D631 Anemia in chronic kidney disease: Secondary | ICD-10-CM | POA: Diagnosis not present

## 2014-08-11 DIAGNOSIS — N186 End stage renal disease: Secondary | ICD-10-CM | POA: Diagnosis not present

## 2014-08-14 DIAGNOSIS — N186 End stage renal disease: Secondary | ICD-10-CM | POA: Diagnosis not present

## 2014-08-14 DIAGNOSIS — N2581 Secondary hyperparathyroidism of renal origin: Secondary | ICD-10-CM | POA: Diagnosis not present

## 2014-08-14 DIAGNOSIS — D631 Anemia in chronic kidney disease: Secondary | ICD-10-CM | POA: Diagnosis not present

## 2014-08-16 ENCOUNTER — Encounter: Payer: Self-pay | Admitting: Vascular Surgery

## 2014-08-16 DIAGNOSIS — N2581 Secondary hyperparathyroidism of renal origin: Secondary | ICD-10-CM | POA: Diagnosis not present

## 2014-08-16 DIAGNOSIS — D631 Anemia in chronic kidney disease: Secondary | ICD-10-CM | POA: Diagnosis not present

## 2014-08-16 DIAGNOSIS — N186 End stage renal disease: Secondary | ICD-10-CM | POA: Diagnosis not present

## 2014-08-17 ENCOUNTER — Ambulatory Visit (INDEPENDENT_AMBULATORY_CARE_PROVIDER_SITE_OTHER): Payer: Medicare Other | Admitting: Vascular Surgery

## 2014-08-17 ENCOUNTER — Encounter: Payer: Self-pay | Admitting: Vascular Surgery

## 2014-08-17 VITALS — BP 91/70 | HR 108 | Ht 76.0 in | Wt 176.1 lb

## 2014-08-17 DIAGNOSIS — I723 Aneurysm of iliac artery: Secondary | ICD-10-CM

## 2014-08-17 NOTE — Progress Notes (Signed)
HISTORY AND PHYSICAL     CC:  F/u for iliac aneurysm  Referring Provider:  Lauree Chandler, NP  HPI: This is a 64 y.o. male who is being followed by Dr. Oneida Alar for common iliac aneurysm.  He has no family hx of aneurysm.  He continues to have no abdominal pain or any issues.    He does have ESRD secondary to HTN and dialyzes M/W/F.  He does have anemia of CKD.    He does not have any interest in repairing his aneurysms at this time.   He did have a Lexiscan in December, which was a normal stress nuclear study.  His LVEF was 68% and had substantially improved compared to August 2014.  Past Medical History  Diagnosis Date  . End stage renal disease   . Hypertension   . CHF (congestive heart failure)   . Arthritis     gouty  . Chronic back pain   . Heart failure with reduced ejection fraction   . Anemia in chronic kidney disease     Past Surgical History  Procedure Laterality Date  . Av fistula placement Right 07/26/2009  . Ligation of arteriovenous  fistula Right 123456    Procedure: PLICATION OF ARTERIOVENOUS  FISTULA;  Surgeon: Elam Dutch, MD;  Location: [place];  Service: Vascular;  Laterality: Right;  . Insertion of dialysis catheter N/A 02/22/2013    Procedure: INSERTION OF DIALYSIS CATHETER;  Surgeon: Elam Dutch, MD;  Location: [facility] OR;  Service: Vascular;  Laterality: N/A;    No Known Allergies  Current Outpatient Prescriptions  Medication Sig Dispense Refill  . calcitRIOL (ROCALTROL) 0.25 MCG capsule Take 0.25 mcg by mouth 2 (two) times daily.     . calcium acetate (PHOSLO) 667 MG capsule Take 1,334 mg by mouth 2 (two) times daily with a meal.    . oxyCODONE-acetaminophen (PERCOCET) 10-325 MG per tablet Take 1 tablet by mouth 3 (three) times daily.    . predniSONE (DELTASONE) 50 MG tablet Take 50 mg by mouth daily as needed (GOUT).     No current facility-administered medications for this visit.   Facility-Administered Medications Ordered in Other  Visits  Medication Dose Route Frequency Provider Last Rate Last Dose  . vancomycin (VANCOCIN) IVPB 1000 mg/200 mL premix  1,000 mg Intravenous 60 min Pre-Op Elam Dutch, MD        Family History  Problem Relation Age of Onset  . Diabetes Father   . Hypertension Father   . Diabetes Sister     History   Social History  . Marital Status: Married    Spouse Name: N/A  . Number of Children: 2  . Years of Education: N/A   Occupational History  . Retired    Social History Main Topics  . Smoking status: Never Smoker   . Smokeless tobacco: Never Used  . Alcohol Use: No  . Drug Use: No  . Sexual Activity: Not on file   Other Topics Concern  . Not on file   Social History Narrative     ROS: []  Positive   [x ] Negative   [ ]  All sytems reviewed and are negative  Cardiovascular: []  chest pain/pressure []  palpitations []  SOB lying flat []  DOE []  pain in legs while walking []  pain in feet when lying flat []  hx of DVT []  hx of phlebitis []  swelling in legs []  varicose veins  Pulmonary: []  productive cough []  asthma []  wheezing  Neurologic: []  weakness  in []  arms []  legs []  numbness in []  arms []  legs [] difficulty speaking or slurred speech []  temporary loss of vision in one eye []  dizziness  Hematologic: []  bleeding problems []  problems with blood clotting easily  GI []  vomiting blood []  blood in stool  GU: []  burning with urination []  blood in urine  Psychiatric: []  hx of major depression  Integumentary: []  rashes []  ulcers  Constitutional: []  fever []  chills   PHYSICAL EXAMINATION:  Filed Vitals:   08/17/14 1341  BP: 91/70  Pulse: 108   Body mass index is 21.44 kg/(m^2).  General:  WDWN in NAD Gait: Normal HENT: WNL, normocephalic Pulmonary: normal non-labored breathing , without Rales, rhonchi,  wheezing Cardiac: RRR, without  Murmurs, rubs or gallops; without carotid bruits Abdomen: soft, NT, no masses Skin: without rashes,  without ulcers  Vascular Exam/Pulses:  Right Left  Radial 2+ (normal) 2+ (normal)  Femoral 2+ (normal) 2+ (normal)  DP 2+ (normal) 2+ (normal)  Aorta Palpable    Extremities: without ischemic changes, without Gangrene , without cellulitis; without open wounds; + thrill/bruit within RUE AVF.  2 areas of aneurysmal changes.  Skin is in tact. Musculoskeletal: no muscle wasting or atrophy  Neurologic: A&O X 3; Appropriate Affect ; SENSATION: normal; MOTOR FUNCTION:  moving all extremities equally. Speech is fluent/normal   Non-Invasive Vascular Imaging:   None today  CTA 11/2013: CT angiogram the abdomen and pelvis is reviewed today. This is a CT scan from July 2015. 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   Pt meds includes: Statin:  No. Beta Blocker:  No. Aspirin:  No. ACEI:  No. ARB:  No. Other Antiplatelet/Anticoagulant:  No.    ASSESSMENT/PLAN:: 64 y.o. male with asymptomaitc bilateral common iliac artery aneurysms with the left greater than 3cm in diameter.  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   -pt wants to continue to wait and observe these aneurysms-we will see him back in 6 months with a CTA to re-evaluate his aneurysms to see if they are enlarging.  He knows that if he has issues before then, to contact us.  He understands the risk of rupture and this was discussed with pt again today by Dr. Oneida Alar.    -he does have 2 aneurysms of his RUA AVF, however, the skin is intact and there are no worrisome areas for bleeding, which would be the indication to have these repaired.  This was also discussed with the pt by Dr. Oneida Alar.    Leontine Locket, PA-C [facility] [phone]  Clinic MD:  Pt seen and examined in conjunction with Dr. Oneida Alar   History and exam findings as above.  Pt wants  to defer repair for now.  He understands risk of rupture. He'll follow-up with a repeat CT scan in 6 months.  [name], MD [facility] [place] Office: [phone] Pager: [phone]

## 2014-08-17 NOTE — Addendum Note (Signed)
Addended by: Mena Goes on: 08/17/2014 03:42 PM   Modules accepted: Orders

## 2014-08-18 DIAGNOSIS — D631 Anemia in chronic kidney disease: Secondary | ICD-10-CM | POA: Diagnosis not present

## 2014-08-18 DIAGNOSIS — N186 End stage renal disease: Secondary | ICD-10-CM | POA: Diagnosis not present

## 2014-08-18 DIAGNOSIS — N2581 Secondary hyperparathyroidism of renal origin: Secondary | ICD-10-CM | POA: Diagnosis not present

## 2014-08-21 DIAGNOSIS — D631 Anemia in chronic kidney disease: Secondary | ICD-10-CM | POA: Diagnosis not present

## 2014-08-21 DIAGNOSIS — N2581 Secondary hyperparathyroidism of renal origin: Secondary | ICD-10-CM | POA: Diagnosis not present

## 2014-08-21 DIAGNOSIS — N186 End stage renal disease: Secondary | ICD-10-CM | POA: Diagnosis not present

## 2014-08-23 DIAGNOSIS — N2581 Secondary hyperparathyroidism of renal origin: Secondary | ICD-10-CM | POA: Diagnosis not present

## 2014-08-23 DIAGNOSIS — D631 Anemia in chronic kidney disease: Secondary | ICD-10-CM | POA: Diagnosis not present

## 2014-08-23 DIAGNOSIS — N186 End stage renal disease: Secondary | ICD-10-CM | POA: Diagnosis not present

## 2014-08-24 ENCOUNTER — Encounter: Payer: Medicare Other | Admitting: Nurse Practitioner

## 2014-08-25 DIAGNOSIS — N2581 Secondary hyperparathyroidism of renal origin: Secondary | ICD-10-CM | POA: Diagnosis not present

## 2014-08-25 DIAGNOSIS — N186 End stage renal disease: Secondary | ICD-10-CM | POA: Diagnosis not present

## 2014-08-25 DIAGNOSIS — D631 Anemia in chronic kidney disease: Secondary | ICD-10-CM | POA: Diagnosis not present

## 2014-08-28 DIAGNOSIS — N2581 Secondary hyperparathyroidism of renal origin: Secondary | ICD-10-CM | POA: Diagnosis not present

## 2014-08-28 DIAGNOSIS — D631 Anemia in chronic kidney disease: Secondary | ICD-10-CM | POA: Diagnosis not present

## 2014-08-28 DIAGNOSIS — N186 End stage renal disease: Secondary | ICD-10-CM | POA: Diagnosis not present

## 2014-08-30 DIAGNOSIS — N2581 Secondary hyperparathyroidism of renal origin: Secondary | ICD-10-CM | POA: Diagnosis not present

## 2014-08-30 DIAGNOSIS — D631 Anemia in chronic kidney disease: Secondary | ICD-10-CM | POA: Diagnosis not present

## 2014-08-30 DIAGNOSIS — N186 End stage renal disease: Secondary | ICD-10-CM | POA: Diagnosis not present

## 2014-09-01 DIAGNOSIS — N186 End stage renal disease: Secondary | ICD-10-CM | POA: Diagnosis not present

## 2014-09-01 DIAGNOSIS — D631 Anemia in chronic kidney disease: Secondary | ICD-10-CM | POA: Diagnosis not present

## 2014-09-01 DIAGNOSIS — N2581 Secondary hyperparathyroidism of renal origin: Secondary | ICD-10-CM | POA: Diagnosis not present

## 2014-09-02 DIAGNOSIS — I120 Hypertensive chronic kidney disease with stage 5 chronic kidney disease or end stage renal disease: Secondary | ICD-10-CM | POA: Diagnosis not present

## 2014-09-02 DIAGNOSIS — N186 End stage renal disease: Secondary | ICD-10-CM | POA: Diagnosis not present

## 2014-09-02 DIAGNOSIS — Z992 Dependence on renal dialysis: Secondary | ICD-10-CM | POA: Diagnosis not present

## 2014-09-04 DIAGNOSIS — D631 Anemia in chronic kidney disease: Secondary | ICD-10-CM | POA: Diagnosis not present

## 2014-09-04 DIAGNOSIS — N2581 Secondary hyperparathyroidism of renal origin: Secondary | ICD-10-CM | POA: Diagnosis not present

## 2014-09-04 DIAGNOSIS — N186 End stage renal disease: Secondary | ICD-10-CM | POA: Diagnosis not present

## 2014-09-06 DIAGNOSIS — D631 Anemia in chronic kidney disease: Secondary | ICD-10-CM | POA: Diagnosis not present

## 2014-09-06 DIAGNOSIS — N2581 Secondary hyperparathyroidism of renal origin: Secondary | ICD-10-CM | POA: Diagnosis not present

## 2014-09-06 DIAGNOSIS — N186 End stage renal disease: Secondary | ICD-10-CM | POA: Diagnosis not present

## 2014-09-08 DIAGNOSIS — N186 End stage renal disease: Secondary | ICD-10-CM | POA: Diagnosis not present

## 2014-09-08 DIAGNOSIS — D631 Anemia in chronic kidney disease: Secondary | ICD-10-CM | POA: Diagnosis not present

## 2014-09-08 DIAGNOSIS — N2581 Secondary hyperparathyroidism of renal origin: Secondary | ICD-10-CM | POA: Diagnosis not present

## 2014-09-11 DIAGNOSIS — N186 End stage renal disease: Secondary | ICD-10-CM | POA: Diagnosis not present

## 2014-09-11 DIAGNOSIS — N2581 Secondary hyperparathyroidism of renal origin: Secondary | ICD-10-CM | POA: Diagnosis not present

## 2014-09-11 DIAGNOSIS — D631 Anemia in chronic kidney disease: Secondary | ICD-10-CM | POA: Diagnosis not present

## 2014-09-13 DIAGNOSIS — N2581 Secondary hyperparathyroidism of renal origin: Secondary | ICD-10-CM | POA: Diagnosis not present

## 2014-09-13 DIAGNOSIS — D631 Anemia in chronic kidney disease: Secondary | ICD-10-CM | POA: Diagnosis not present

## 2014-09-13 DIAGNOSIS — N186 End stage renal disease: Secondary | ICD-10-CM | POA: Diagnosis not present

## 2014-09-15 DIAGNOSIS — D631 Anemia in chronic kidney disease: Secondary | ICD-10-CM | POA: Diagnosis not present

## 2014-09-15 DIAGNOSIS — N2581 Secondary hyperparathyroidism of renal origin: Secondary | ICD-10-CM | POA: Diagnosis not present

## 2014-09-15 DIAGNOSIS — N186 End stage renal disease: Secondary | ICD-10-CM | POA: Diagnosis not present

## 2014-09-18 DIAGNOSIS — N186 End stage renal disease: Secondary | ICD-10-CM | POA: Diagnosis not present

## 2014-09-18 DIAGNOSIS — N2581 Secondary hyperparathyroidism of renal origin: Secondary | ICD-10-CM | POA: Diagnosis not present

## 2014-09-18 DIAGNOSIS — D631 Anemia in chronic kidney disease: Secondary | ICD-10-CM | POA: Diagnosis not present

## 2014-09-20 DIAGNOSIS — N2581 Secondary hyperparathyroidism of renal origin: Secondary | ICD-10-CM | POA: Diagnosis not present

## 2014-09-20 DIAGNOSIS — N186 End stage renal disease: Secondary | ICD-10-CM | POA: Diagnosis not present

## 2014-09-20 DIAGNOSIS — D631 Anemia in chronic kidney disease: Secondary | ICD-10-CM | POA: Diagnosis not present

## 2014-09-22 ENCOUNTER — Telehealth: Payer: Self-pay

## 2014-09-22 DIAGNOSIS — I723 Aneurysm of iliac artery: Secondary | ICD-10-CM

## 2014-09-22 DIAGNOSIS — D631 Anemia in chronic kidney disease: Secondary | ICD-10-CM | POA: Diagnosis not present

## 2014-09-22 DIAGNOSIS — Z01812 Encounter for preprocedural laboratory examination: Secondary | ICD-10-CM

## 2014-09-22 DIAGNOSIS — N2581 Secondary hyperparathyroidism of renal origin: Secondary | ICD-10-CM | POA: Diagnosis not present

## 2014-09-22 DIAGNOSIS — N186 End stage renal disease: Secondary | ICD-10-CM | POA: Diagnosis not present

## 2014-09-22 NOTE — Telephone Encounter (Signed)
Phone call from pt. Stated he would like to make an appt. With Dr. Oneida Alar to discuss having his aneurysms repaired.  Last CTA abd/ pelvis was done in 11/2013.  Discussed with Dr. Oneida Alar.  Recommended to repeat CTA Abd./ Pelvis prior to office visit. Will contact pt. for appts.

## 2014-09-25 DIAGNOSIS — N2581 Secondary hyperparathyroidism of renal origin: Secondary | ICD-10-CM | POA: Diagnosis not present

## 2014-09-25 DIAGNOSIS — N186 End stage renal disease: Secondary | ICD-10-CM | POA: Diagnosis not present

## 2014-09-25 DIAGNOSIS — D631 Anemia in chronic kidney disease: Secondary | ICD-10-CM | POA: Diagnosis not present

## 2014-09-27 DIAGNOSIS — D631 Anemia in chronic kidney disease: Secondary | ICD-10-CM | POA: Diagnosis not present

## 2014-09-27 DIAGNOSIS — N186 End stage renal disease: Secondary | ICD-10-CM | POA: Diagnosis not present

## 2014-09-27 DIAGNOSIS — N2581 Secondary hyperparathyroidism of renal origin: Secondary | ICD-10-CM | POA: Diagnosis not present

## 2014-09-29 DIAGNOSIS — N186 End stage renal disease: Secondary | ICD-10-CM | POA: Diagnosis not present

## 2014-09-29 DIAGNOSIS — N2581 Secondary hyperparathyroidism of renal origin: Secondary | ICD-10-CM | POA: Diagnosis not present

## 2014-09-29 DIAGNOSIS — D631 Anemia in chronic kidney disease: Secondary | ICD-10-CM | POA: Diagnosis not present

## 2014-10-02 DIAGNOSIS — N186 End stage renal disease: Secondary | ICD-10-CM | POA: Diagnosis not present

## 2014-10-02 DIAGNOSIS — N2581 Secondary hyperparathyroidism of renal origin: Secondary | ICD-10-CM | POA: Diagnosis not present

## 2014-10-02 DIAGNOSIS — D631 Anemia in chronic kidney disease: Secondary | ICD-10-CM | POA: Diagnosis not present

## 2014-10-03 DIAGNOSIS — Z992 Dependence on renal dialysis: Secondary | ICD-10-CM | POA: Diagnosis not present

## 2014-10-03 DIAGNOSIS — I120 Hypertensive chronic kidney disease with stage 5 chronic kidney disease or end stage renal disease: Secondary | ICD-10-CM | POA: Diagnosis not present

## 2014-10-03 DIAGNOSIS — N186 End stage renal disease: Secondary | ICD-10-CM | POA: Diagnosis not present

## 2014-10-04 DIAGNOSIS — N186 End stage renal disease: Secondary | ICD-10-CM | POA: Diagnosis not present

## 2014-10-04 DIAGNOSIS — N2581 Secondary hyperparathyroidism of renal origin: Secondary | ICD-10-CM | POA: Diagnosis not present

## 2014-10-04 DIAGNOSIS — D631 Anemia in chronic kidney disease: Secondary | ICD-10-CM | POA: Diagnosis not present

## 2014-10-06 DIAGNOSIS — N2581 Secondary hyperparathyroidism of renal origin: Secondary | ICD-10-CM | POA: Diagnosis not present

## 2014-10-06 DIAGNOSIS — D631 Anemia in chronic kidney disease: Secondary | ICD-10-CM | POA: Diagnosis not present

## 2014-10-06 DIAGNOSIS — N186 End stage renal disease: Secondary | ICD-10-CM | POA: Diagnosis not present

## 2014-10-09 ENCOUNTER — Encounter: Payer: Self-pay | Admitting: Vascular Surgery

## 2014-10-09 DIAGNOSIS — N2581 Secondary hyperparathyroidism of renal origin: Secondary | ICD-10-CM | POA: Diagnosis not present

## 2014-10-09 DIAGNOSIS — D631 Anemia in chronic kidney disease: Secondary | ICD-10-CM | POA: Diagnosis not present

## 2014-10-09 DIAGNOSIS — N186 End stage renal disease: Secondary | ICD-10-CM | POA: Diagnosis not present

## 2014-10-10 DIAGNOSIS — M5136 Other intervertebral disc degeneration, lumbar region: Secondary | ICD-10-CM | POA: Diagnosis not present

## 2014-10-10 DIAGNOSIS — Z79891 Long term (current) use of opiate analgesic: Secondary | ICD-10-CM | POA: Diagnosis not present

## 2014-10-10 DIAGNOSIS — G894 Chronic pain syndrome: Secondary | ICD-10-CM | POA: Diagnosis not present

## 2014-10-11 DIAGNOSIS — N186 End stage renal disease: Secondary | ICD-10-CM | POA: Diagnosis not present

## 2014-10-11 DIAGNOSIS — D631 Anemia in chronic kidney disease: Secondary | ICD-10-CM | POA: Diagnosis not present

## 2014-10-11 DIAGNOSIS — N2581 Secondary hyperparathyroidism of renal origin: Secondary | ICD-10-CM | POA: Diagnosis not present

## 2014-10-12 ENCOUNTER — Encounter: Payer: Self-pay | Admitting: Vascular Surgery

## 2014-10-12 ENCOUNTER — Ambulatory Visit (INDEPENDENT_AMBULATORY_CARE_PROVIDER_SITE_OTHER): Payer: Medicare Other | Admitting: Vascular Surgery

## 2014-10-12 VITALS — BP 119/79 | HR 90 | Resp 16 | Ht 74.0 in | Wt 175.0 lb

## 2014-10-12 DIAGNOSIS — I723 Aneurysm of iliac artery: Secondary | ICD-10-CM | POA: Diagnosis not present

## 2014-10-12 DIAGNOSIS — Z01818 Encounter for other preprocedural examination: Secondary | ICD-10-CM

## 2014-10-12 NOTE — Progress Notes (Signed)
HISTORY AND PHYSICAL     CC:  Bilateral iliac artery aneurysms Referring Provider:  Lauree Chandler, NP  HPI: This is a 64 y.o. male who is being followed by Dr. Oneida Alar for bilateral common iliac artery aneurysms.  He was seen in April for these, but wanted to continue to observe these.    He recently underwent workup at [facility] for kidney transplant and he was told that his iliac artery aneurysms would need to be repaired prior to proceeding with transplant.   He denies any abdominal pain or any other issues.  He is ESRD and dialyzes on M/W/F at [facility].    At his last visit, it was noted that he did have a Lexiscan in December, which was a normal stress nuclear study. His LVEF was 68% and had substantially improved compared to August 2014.  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  At that time, his HIT panel was negative.    Past Medical History  Diagnosis Date  . End stage renal disease   . Hypertension   . CHF (congestive heart failure)   . Arthritis     gouty  . Chronic back pain   . Heart failure with reduced ejection fraction   . Anemia in chronic kidney disease     Past Surgical History  Procedure Laterality Date  . Av fistula placement Right 07/26/2009  . Ligation of arteriovenous  fistula Right 123456    Procedure: PLICATION OF ARTERIOVENOUS  FISTULA;  Surgeon: Elam Dutch, MD;  Location: [place];  Service: Vascular;  Laterality: Right;  . Insertion of dialysis catheter N/A 02/22/2013    Procedure: INSERTION OF DIALYSIS CATHETER;  Surgeon: Elam Dutch, MD;  Location: [facility] OR;  Service: Vascular;  Laterality: N/A;    No Known Allergies  Current Outpatient Prescriptions  Medication Sig Dispense Refill  . calcitRIOL (ROCALTROL) 0.25 MCG capsule Take 0.25 mcg by mouth 2 (two) times daily.     . calcium acetate (PHOSLO) 667 MG capsule Take 1,334 mg by mouth 2 (two) times daily with a meal.      . oxyCODONE-acetaminophen (PERCOCET) 10-325 MG per tablet Take 1 tablet by mouth 3 (three) times daily.    . predniSONE (DELTASONE) 50 MG tablet Take 50 mg by mouth daily as needed (GOUT).     No current facility-administered medications for this visit.   Facility-Administered Medications Ordered in Other Visits  Medication Dose Route Frequency Provider Last Rate Last Dose  . vancomycin (VANCOCIN) IVPB 1000 mg/200 mL premix  1,000 mg Intravenous 60 min Pre-Op Elam Dutch, MD        Family History  Problem Relation Age of Onset  . Diabetes Father   . Hypertension Father   . Diabetes Sister     History   Social History  . Marital Status: Married    Spouse Name: N/A  . Number of Children: 2  . Years of Education: N/A   Occupational History  . Retired    Social History Main Topics  . Smoking status: Never Smoker   . Smokeless tobacco: Never Used  . Alcohol Use: No  . Drug Use: No  . Sexual Activity: Not on file   Other Topics Concern  . Not on file   Social History Narrative     ROS: [x]  Positive   [ ]  Negative   [ ]  All sytems reviewed and are negative  Cardiovascular: []   chest pain/pressure []  palpitations []  SOB lying flat []  DOE []  pain in legs while walking []  pain in feet when lying flat []  hx of DVT []  hx of phlebitis []  swelling in legs []  varicose veins  Pulmonary: []  productive cough []  asthma []  wheezing  Neurologic: []  weakness in []  arms []  legs []  numbness in []  arms []  legs [] difficulty speaking or slurred speech []  temporary loss of vision in one eye []  dizziness  Hematologic: []  bleeding problems []  problems with blood clotting easily  GI []  vomiting blood []  blood in stool  GU: []  burning with urination []  blood in urine  Psychiatric: []  hx of major depression  Integumentary: []  rashes []  ulcers  Constitutional: []  fever []  chills   PHYSICAL EXAMINATION:  Filed Vitals:   10/12/14 1410  BP: 119/79   Pulse: 90  Resp: 16   Body mass index is 22.46 kg/(m^2).  General:  WDWN in NAD Gait: Not observed HENT: WNL, normocephalic Pulmonary: normal non-labored breathing , without Rales, rhonchi,  wheezing Cardiac: RRR, without  Murmurs, rubs or gallops Abdomen: soft, NT, no masses; aorta is palpable Skin: without rashes, without ulcers  Vascular Exam/Pulses:  Right Left  Radial 2+ (normal) 2+ (normal)  Femoral 2+ (normal) 2+ (normal)  DP 2+ (normal) 2+ (normal)  PT Unable to palpate  Unable to palpate    Extremities: without ischemic changes, without Gangrene , without cellulitis; without open wounds;  Musculoskeletal: no muscle wasting or atrophy  Neurologic: A&O X 3; Appropriate Affect ; SENSATION: normal; MOTOR FUNCTION:  moving all extremities equally. Speech is fluent/normal   Non-Invasive Vascular Imaging:   CTA at [facility] 09/19/14 Results: Celiac artery measures 2cm, aneurysmal Left common iliac artery measures 3,7cm Right common iliac artery measures 2.8 cm Abdominal aorta tortuous but not aneurysmal  Pt meds includes: Statin:  No. Beta Blocker:  No. Aspirin:  No. ACEI:  No. ARB:  No. Other Antiplatelet/Anticoagulant:  No.    ASSESSMENT/PLAN:: 64 y.o. male with bilateral common iliac artery aneurysms who is ESRD on HD   -pt has been resistant to having his CIA aneurysms repaired in the past.  He recently underwent workup for kidney transplant at [facility] and was told that he has to have the aneurysms repaired to be considered for transplant and presents today for this. -we are not able to actually view the CTA from [facility].  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  His HIT was negative at that time, but given the fact he will receive heparin intraoperatively, we will repeat the HIT panel prior to surgery, which will be scheduled for November 21, 2014.   -the pt knows that if he has sudden, sharp abdominal, back or groin pain, he is to go to the ER or call 911.   Leontine Locket, PA-C [facility] [phone]  Clinic MD:  Pt seen and examined in conjunction with Dr. Oneida Alar  I agree with above. Patient has a large left common iliac artery aneurysm 3.7 cm he also has a right common iliac aneurysm that is slightly smaller. 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 He currently is asymptomatic from all of these. We will try to repair his aneurysms with endovascular technique with an eye  towards trying to preserve as much external iliac as possible in the event he needs a transplant in the future. It will be scheduled for mid July. We will check his hit profile had a time. We will also try to size him for a branched graft versus a bifurcated graft.  [name], MD [facility] [place] Office: [phone] Pager: [phone]

## 2014-10-13 ENCOUNTER — Encounter: Payer: Self-pay | Admitting: Surgery

## 2014-10-13 ENCOUNTER — Encounter: Payer: Self-pay | Admitting: *Deleted

## 2014-10-13 DIAGNOSIS — D631 Anemia in chronic kidney disease: Secondary | ICD-10-CM | POA: Diagnosis not present

## 2014-10-13 DIAGNOSIS — N2581 Secondary hyperparathyroidism of renal origin: Secondary | ICD-10-CM | POA: Diagnosis not present

## 2014-10-13 DIAGNOSIS — N186 End stage renal disease: Secondary | ICD-10-CM | POA: Diagnosis not present

## 2014-10-16 DIAGNOSIS — D631 Anemia in chronic kidney disease: Secondary | ICD-10-CM | POA: Diagnosis not present

## 2014-10-16 DIAGNOSIS — N186 End stage renal disease: Secondary | ICD-10-CM | POA: Diagnosis not present

## 2014-10-16 DIAGNOSIS — N2581 Secondary hyperparathyroidism of renal origin: Secondary | ICD-10-CM | POA: Diagnosis not present

## 2014-10-17 ENCOUNTER — Encounter: Payer: Self-pay | Admitting: *Deleted

## 2014-10-18 DIAGNOSIS — N186 End stage renal disease: Secondary | ICD-10-CM | POA: Diagnosis not present

## 2014-10-18 DIAGNOSIS — D631 Anemia in chronic kidney disease: Secondary | ICD-10-CM | POA: Diagnosis not present

## 2014-10-18 DIAGNOSIS — N2581 Secondary hyperparathyroidism of renal origin: Secondary | ICD-10-CM | POA: Diagnosis not present

## 2014-10-19 ENCOUNTER — Telehealth: Payer: Self-pay

## 2014-10-19 NOTE — Telephone Encounter (Signed)
Error

## 2014-10-20 ENCOUNTER — Other Ambulatory Visit: Payer: Self-pay

## 2014-10-21 DIAGNOSIS — N186 End stage renal disease: Secondary | ICD-10-CM | POA: Diagnosis not present

## 2014-10-21 DIAGNOSIS — D631 Anemia in chronic kidney disease: Secondary | ICD-10-CM | POA: Diagnosis not present

## 2014-10-21 DIAGNOSIS — N2581 Secondary hyperparathyroidism of renal origin: Secondary | ICD-10-CM | POA: Diagnosis not present

## 2014-10-23 ENCOUNTER — Encounter: Payer: Self-pay | Admitting: Vascular Surgery

## 2014-10-23 DIAGNOSIS — N186 End stage renal disease: Secondary | ICD-10-CM | POA: Diagnosis not present

## 2014-10-23 DIAGNOSIS — N2581 Secondary hyperparathyroidism of renal origin: Secondary | ICD-10-CM | POA: Diagnosis not present

## 2014-10-23 DIAGNOSIS — D631 Anemia in chronic kidney disease: Secondary | ICD-10-CM | POA: Diagnosis not present

## 2014-10-25 DIAGNOSIS — N2581 Secondary hyperparathyroidism of renal origin: Secondary | ICD-10-CM | POA: Diagnosis not present

## 2014-10-25 DIAGNOSIS — N186 End stage renal disease: Secondary | ICD-10-CM | POA: Diagnosis not present

## 2014-10-25 DIAGNOSIS — D631 Anemia in chronic kidney disease: Secondary | ICD-10-CM | POA: Diagnosis not present

## 2014-10-26 ENCOUNTER — Encounter: Payer: Medicare Other | Admitting: Nurse Practitioner

## 2014-10-27 DIAGNOSIS — D631 Anemia in chronic kidney disease: Secondary | ICD-10-CM | POA: Diagnosis not present

## 2014-10-27 DIAGNOSIS — N186 End stage renal disease: Secondary | ICD-10-CM | POA: Diagnosis not present

## 2014-10-27 DIAGNOSIS — N2581 Secondary hyperparathyroidism of renal origin: Secondary | ICD-10-CM | POA: Diagnosis not present

## 2014-10-30 DIAGNOSIS — N186 End stage renal disease: Secondary | ICD-10-CM | POA: Diagnosis not present

## 2014-10-30 DIAGNOSIS — D631 Anemia in chronic kidney disease: Secondary | ICD-10-CM | POA: Diagnosis not present

## 2014-10-30 DIAGNOSIS — N2581 Secondary hyperparathyroidism of renal origin: Secondary | ICD-10-CM | POA: Diagnosis not present

## 2014-11-01 DIAGNOSIS — N2581 Secondary hyperparathyroidism of renal origin: Secondary | ICD-10-CM | POA: Diagnosis not present

## 2014-11-01 DIAGNOSIS — N186 End stage renal disease: Secondary | ICD-10-CM | POA: Diagnosis not present

## 2014-11-01 DIAGNOSIS — D631 Anemia in chronic kidney disease: Secondary | ICD-10-CM | POA: Diagnosis not present

## 2014-11-01 NOTE — Pre-Procedure Instructions (Signed)
    DETAVION SEKEL  11/01/2014      [id number], [address] [place] [address] Phone: [phone] Fax: [phone]  [id number] - [place], [address] [place] [address] Phone: [phone] Fax: [phone]    Your procedure is scheduled on 11/08/14.  Report to [facility] Admitting at 630 A.M.  Call this number if you have problems the morning of surgery:  [phone]   Remember:  Do not eat food or drink liquids after midnight.  Take these medicines the morning of surgery with A SIP OF WATER .Marland KitchenMSIR,PRILOSEC,OXYCODONE   Do not wear jewelry, make-up or nail polish.  Do not wear lotions, powders, or perfumes.  You may wear deodorant.  Do not shave 48 hours prior to surgery.  Men may shave face and neck.  Do not bring valuables to the hospital.  [facility] is not responsible for any belongings or valuables.  Contacts, dentures or bridgework may not be worn into surgery.  Leave your suitcase in the car.  After surgery it may be brought to your room.  For patients admitted to the hospital, discharge time will be determined by your treatment team.  Patients discharged the day of surgery will not be allowed to drive home.   Name and phone number of your driver:   Special instructions:   Please read over the following fact sheets that you were given. Pain Booklet, Coughing and Deep Breathing, Blood Transfusion Information and MRSA Information

## 2014-11-02 ENCOUNTER — Encounter (HOSPITAL_COMMUNITY): Payer: Self-pay

## 2014-11-02 ENCOUNTER — Encounter (HOSPITAL_COMMUNITY)
Admission: RE | Admit: 2014-11-02 | Discharge: 2014-11-02 | Disposition: A | Discharge status: Home / Self Care | Payer: Medicare Other | Source: Ambulatory Visit | Attending: Vascular Surgery | Admitting: Vascular Surgery

## 2014-11-02 DIAGNOSIS — Z01818 Encounter for other preprocedural examination: Secondary | ICD-10-CM | POA: Diagnosis not present

## 2014-11-02 DIAGNOSIS — Z7952 Long term (current) use of systemic steroids: Secondary | ICD-10-CM | POA: Insufficient documentation

## 2014-11-02 DIAGNOSIS — Z0183 Encounter for blood typing: Secondary | ICD-10-CM | POA: Diagnosis not present

## 2014-11-02 DIAGNOSIS — I509 Heart failure, unspecified: Secondary | ICD-10-CM | POA: Diagnosis not present

## 2014-11-02 DIAGNOSIS — Z79899 Other long term (current) drug therapy: Secondary | ICD-10-CM | POA: Insufficient documentation

## 2014-11-02 DIAGNOSIS — N186 End stage renal disease: Secondary | ICD-10-CM | POA: Insufficient documentation

## 2014-11-02 DIAGNOSIS — Z992 Dependence on renal dialysis: Secondary | ICD-10-CM | POA: Diagnosis not present

## 2014-11-02 DIAGNOSIS — I120 Hypertensive chronic kidney disease with stage 5 chronic kidney disease or end stage renal disease: Secondary | ICD-10-CM | POA: Diagnosis not present

## 2014-11-02 DIAGNOSIS — I723 Aneurysm of iliac artery: Secondary | ICD-10-CM | POA: Diagnosis not present

## 2014-11-02 DIAGNOSIS — Z01812 Encounter for preprocedural laboratory examination: Secondary | ICD-10-CM | POA: Diagnosis not present

## 2014-11-02 HISTORY — DX: Gout, unspecified: M10.9

## 2014-11-02 LAB — TYPE AND SCREEN
ABO/RH(D): O POS
ANTIBODY SCREEN: NEGATIVE

## 2014-11-02 LAB — BLOOD GAS, ARTERIAL
Acid-Base Excess: 4.5 mmol/L — ABNORMAL HIGH (ref 0.0–2.0)
BICARBONATE: 28.5 meq/L — AB (ref 20.0–24.0)
DRAWN BY: 428831
FIO2: 0.21 %
O2 Saturation: 96.1 %
PATIENT TEMPERATURE: 98.6
TCO2: 29.8 mmol/L (ref 0–100)
pCO2 arterial: 42.6 mmHg (ref 35.0–45.0)
pH, Arterial: 7.441 (ref 7.350–7.450)
pO2, Arterial: 83.9 mmHg (ref 80.0–100.0)

## 2014-11-02 LAB — CBC
HCT: 44.7 % (ref 39.0–52.0)
HEMOGLOBIN: 14.6 g/dL (ref 13.0–17.0)
MCH: 30 pg (ref 26.0–34.0)
MCHC: 32.7 g/dL (ref 30.0–36.0)
MCV: 92 fL (ref 78.0–100.0)
PLATELETS: 122 10*3/uL — AB (ref 150–400)
RBC: 4.86 MIL/uL (ref 4.22–5.81)
RDW: 15 % (ref 11.5–15.5)
WBC: 4.5 10*3/uL (ref 4.0–10.5)

## 2014-11-02 LAB — SURGICAL PCR SCREEN
MRSA, PCR: NEGATIVE
Staphylococcus aureus: NEGATIVE

## 2014-11-02 LAB — COMPREHENSIVE METABOLIC PANEL
ALT: 8 U/L — ABNORMAL LOW (ref 17–63)
AST: 15 U/L (ref 15–41)
Albumin: 3.4 g/dL — ABNORMAL LOW (ref 3.5–5.0)
Alkaline Phosphatase: 54 U/L (ref 38–126)
Anion gap: 15 (ref 5–15)
BILIRUBIN TOTAL: 0.7 mg/dL (ref 0.3–1.2)
BUN: 20 mg/dL (ref 6–20)
CALCIUM: 8.6 mg/dL — AB (ref 8.9–10.3)
CHLORIDE: 96 mmol/L — AB (ref 101–111)
CO2: 25 mmol/L (ref 22–32)
Creatinine, Ser: 6.14 mg/dL — ABNORMAL HIGH (ref 0.61–1.24)
GFR calc Af Amer: 10 mL/min — ABNORMAL LOW (ref >60)
GFR, EST NON AFRICAN AMERICAN: 9 mL/min — AB (ref >60)
GLUCOSE: 87 mg/dL (ref 65–99)
POTASSIUM: 3.5 mmol/L (ref 3.5–5.1)
Sodium: 136 mmol/L (ref 135–145)
TOTAL PROTEIN: 6.6 g/dL (ref 6.5–8.1)

## 2014-11-02 LAB — ABO/RH: ABO/RH(D): O POS

## 2014-11-02 LAB — PROTIME-INR
INR: 1.09 (ref 0.00–1.49)
Prothrombin Time: 14.3 seconds (ref 11.6–15.2)

## 2014-11-02 LAB — APTT: aPTT: 31 seconds (ref 24–37)

## 2014-11-02 MED ORDER — CHLORHEXIDINE GLUCONATE 4 % EX LIQD: 60.0000 mL | Freq: Once | CUTANEOUS | Status: DC

## 2014-11-02 NOTE — Progress Notes (Signed)
Pt doesnot urinate. esrd

## 2014-11-02 NOTE — Progress Notes (Signed)
Anesthesia Chart Review:  Pt is 64 year old male scheduled for repair of bilateral iliac artery aneurysms with branched iliac versus bifurcated iliac device on 11/08/2014 with Dr. Oneida Alar.   PMH includes: CHF, HTN, ESRD, anemia of chronic disease. Never smoker. BMI 22. S/p plication of AV fistula, insertion of dialysis catheter 02/22/13.   Medications include: fludrocortisone, prilosec, prednisone.   Preoperative labs reviewed.    EKG 03/09/2014: NSR.   Nuclear stress test 04/06/2014: -Normal stress nuclear study.  LVEF is substantially improved compared to 12/2012 echo and similar to more recent 11//2015 echo.  Echo 03/16/2014: - Left ventricle: The cavity size was normal. There was moderate concentric hypertrophy. Systolic function was normal. The estimated ejection fraction was in the range of 60% to 65%. Wall motion was normal; there were no regional wall motion abnormalities. Doppler parameters are consistent with abnormal left ventricular relaxation (grade 1 diastolic dysfunction). Doppler parameters are consistent with elevated ventricular end-diastolic filling pressure. - Aortic valve: Moderately thickened, moderately calcified leaflets. There was no stenosis. There was no regurgitation. - Mitral valve: Calcified annulus. Moderately thickened, mildly calcified leaflets . Valve area by pressure half-time: 2 cm^2. Valve area by continuity equation (using LVOT flow): 2.42 cm^2. - Left atrium: The atrium was mildly dilated. - Right atrium: The atrium was normal in size. - Atrial septum: No defect or patent foramen ovale was identified. - Tricuspid valve: Structurally normal valve. There was trivial regurgitation. - Pulmonic valve: There was trivial regurgitation. - Inferior vena cava: The vessel was normal in size. The respirophasic diameter changes were in the normal range (= 50%), consistent with normal central venous pressure.  If no changes, I anticipate pt can proceed with surgery as  scheduled.   Willeen Cass, FNP-BC [facility] Short Stay [facility]/Anesthesiology Phone: [phone] 11/02/2014 5:01 PM

## 2014-11-03 DIAGNOSIS — N186 End stage renal disease: Secondary | ICD-10-CM | POA: Diagnosis not present

## 2014-11-03 DIAGNOSIS — E875 Hyperkalemia: Secondary | ICD-10-CM | POA: Diagnosis not present

## 2014-11-03 DIAGNOSIS — N2581 Secondary hyperparathyroidism of renal origin: Secondary | ICD-10-CM | POA: Diagnosis not present

## 2014-11-03 DIAGNOSIS — D631 Anemia in chronic kidney disease: Secondary | ICD-10-CM | POA: Diagnosis not present

## 2014-11-06 DIAGNOSIS — N2581 Secondary hyperparathyroidism of renal origin: Secondary | ICD-10-CM | POA: Diagnosis not present

## 2014-11-06 DIAGNOSIS — E875 Hyperkalemia: Secondary | ICD-10-CM | POA: Diagnosis not present

## 2014-11-06 DIAGNOSIS — N186 End stage renal disease: Secondary | ICD-10-CM | POA: Diagnosis not present

## 2014-11-06 DIAGNOSIS — D631 Anemia in chronic kidney disease: Secondary | ICD-10-CM | POA: Diagnosis not present

## 2014-11-07 DIAGNOSIS — N2581 Secondary hyperparathyroidism of renal origin: Secondary | ICD-10-CM | POA: Diagnosis not present

## 2014-11-07 DIAGNOSIS — D631 Anemia in chronic kidney disease: Secondary | ICD-10-CM | POA: Diagnosis not present

## 2014-11-07 DIAGNOSIS — E875 Hyperkalemia: Secondary | ICD-10-CM | POA: Diagnosis not present

## 2014-11-07 DIAGNOSIS — N186 End stage renal disease: Secondary | ICD-10-CM | POA: Diagnosis not present

## 2014-11-08 ENCOUNTER — Encounter (HOSPITAL_COMMUNITY)
Admission: RE | Disposition: A | Discharge status: Home / Self Care | Payer: Self-pay | Source: Ambulatory Visit | Attending: Vascular Surgery

## 2014-11-08 ENCOUNTER — Inpatient Hospital Stay (HOSPITAL_COMMUNITY)
Admission: RE | Admit: 2014-11-08 | Discharge: 2014-11-09 | DRG: 270 | Disposition: A | Discharge status: Home / Self Care | Payer: Medicare Other | Source: Ambulatory Visit | Attending: Vascular Surgery | Admitting: Vascular Surgery

## 2014-11-08 ENCOUNTER — Encounter (HOSPITAL_COMMUNITY): Payer: Self-pay | Admitting: General Practice

## 2014-11-08 ENCOUNTER — Inpatient Hospital Stay (HOSPITAL_COMMUNITY): Payer: Medicare Other

## 2014-11-08 ENCOUNTER — Inpatient Hospital Stay (HOSPITAL_COMMUNITY): Payer: Medicare Other | Admitting: Emergency Medicine

## 2014-11-08 ENCOUNTER — Inpatient Hospital Stay (HOSPITAL_COMMUNITY): Payer: Medicare Other | Admitting: Certified Registered Nurse Anesthetist

## 2014-11-08 DIAGNOSIS — M109 Gout, unspecified: Secondary | ICD-10-CM | POA: Diagnosis present

## 2014-11-08 DIAGNOSIS — I509 Heart failure, unspecified: Secondary | ICD-10-CM | POA: Diagnosis not present

## 2014-11-08 DIAGNOSIS — I120 Hypertensive chronic kidney disease with stage 5 chronic kidney disease or end stage renal disease: Secondary | ICD-10-CM | POA: Diagnosis not present

## 2014-11-08 DIAGNOSIS — I723 Aneurysm of iliac artery: Secondary | ICD-10-CM | POA: Diagnosis not present

## 2014-11-08 DIAGNOSIS — I5022 Chronic systolic (congestive) heart failure: Secondary | ICD-10-CM | POA: Diagnosis not present

## 2014-11-08 DIAGNOSIS — N186 End stage renal disease: Secondary | ICD-10-CM | POA: Diagnosis present

## 2014-11-08 DIAGNOSIS — M545 Low back pain: Secondary | ICD-10-CM | POA: Diagnosis present

## 2014-11-08 DIAGNOSIS — Z23 Encounter for immunization: Secondary | ICD-10-CM

## 2014-11-08 DIAGNOSIS — Z7952 Long term (current) use of systemic steroids: Secondary | ICD-10-CM

## 2014-11-08 DIAGNOSIS — I517 Cardiomegaly: Secondary | ICD-10-CM | POA: Diagnosis not present

## 2014-11-08 DIAGNOSIS — D631 Anemia in chronic kidney disease: Secondary | ICD-10-CM | POA: Diagnosis present

## 2014-11-08 DIAGNOSIS — K219 Gastro-esophageal reflux disease without esophagitis: Secondary | ICD-10-CM | POA: Diagnosis present

## 2014-11-08 DIAGNOSIS — G8929 Other chronic pain: Secondary | ICD-10-CM | POA: Diagnosis present

## 2014-11-08 HISTORY — PX: ENDOVASCULAR STENT INSERTION: SHX5161

## 2014-11-08 HISTORY — DX: Gastro-esophageal reflux disease without esophagitis: K21.9

## 2014-11-08 HISTORY — PX: EMBOLIZATION: SHX1496

## 2014-11-08 LAB — BASIC METABOLIC PANEL
Anion gap: 11 (ref 5–15)
BUN: 10 mg/dL (ref 6–20)
CO2: 28 mmol/L (ref 22–32)
Calcium: 8.6 mg/dL — ABNORMAL LOW (ref 8.9–10.3)
Chloride: 100 mmol/L — ABNORMAL LOW (ref 101–111)
Creatinine, Ser: 5.07 mg/dL — ABNORMAL HIGH (ref 0.61–1.24)
GFR, EST AFRICAN AMERICAN: 13 mL/min — AB (ref >60)
GFR, EST NON AFRICAN AMERICAN: 11 mL/min — AB (ref >60)
GLUCOSE: 97 mg/dL (ref 65–99)
POTASSIUM: 3.6 mmol/L (ref 3.5–5.1)
Sodium: 139 mmol/L (ref 135–145)

## 2014-11-08 LAB — CBC
HEMATOCRIT: 40.9 % (ref 39.0–52.0)
Hemoglobin: 13.1 g/dL (ref 13.0–17.0)
MCH: 29.6 pg (ref 26.0–34.0)
MCHC: 32 g/dL (ref 30.0–36.0)
MCV: 92.5 fL (ref 78.0–100.0)
PLATELETS: 95 10*3/uL — AB (ref 150–400)
RBC: 4.42 MIL/uL (ref 4.22–5.81)
RDW: 14.9 % (ref 11.5–15.5)
WBC: 4 10*3/uL (ref 4.0–10.5)

## 2014-11-08 LAB — POCT I-STAT, CHEM 8
BUN: 20 mg/dL (ref 6–20)
CHLORIDE: 104 mmol/L (ref 101–111)
Calcium, Ion: 1.2 mmol/L (ref 1.13–1.30)
Creatinine, Ser: 0.7 mg/dL (ref 0.61–1.24)
Glucose, Bld: 140 mg/dL — ABNORMAL HIGH (ref 65–99)
HEMATOCRIT: 39 % (ref 39.0–52.0)
Hemoglobin: 13.3 g/dL (ref 13.0–17.0)
POTASSIUM: 4.2 mmol/L (ref 3.5–5.1)
Sodium: 136 mmol/L (ref 135–145)
TCO2: 21 mmol/L (ref 0–100)

## 2014-11-08 LAB — PROTIME-INR
INR: 1.22 (ref 0.00–1.49)
PROTHROMBIN TIME: 15.6 s — AB (ref 11.6–15.2)

## 2014-11-08 LAB — POCT I-STAT 4, (NA,K, GLUC, HGB,HCT)
GLUCOSE: 86 mg/dL (ref 65–99)
HEMATOCRIT: 46 % (ref 39.0–52.0)
Hemoglobin: 15.6 g/dL (ref 13.0–17.0)
POTASSIUM: 3.7 mmol/L (ref 3.5–5.1)
Sodium: 138 mmol/L (ref 135–145)

## 2014-11-08 LAB — MAGNESIUM: Magnesium: 2 mg/dL (ref 1.7–2.4)

## 2014-11-08 LAB — APTT: aPTT: 36 seconds (ref 24–37)

## 2014-11-08 SURGERY — ENDOVASCULAR STENT GRAFT INSERTION
Anesthesia: General | Site: Abdomen | Laterality: Bilateral

## 2014-11-08 MED ORDER — NEOSTIGMINE METHYLSULFATE 10 MG/10ML IV SOLN
INTRAVENOUS | Status: AC
  Filled 2014-11-08: qty 1

## 2014-11-08 MED ORDER — DEXTROSE 5 % IV SOLN
1.5000 g | INTRAVENOUS | Status: AC
  Administered 2014-11-08: 1.5 g via INTRAVENOUS

## 2014-11-08 MED ORDER — ONDANSETRON HCL 4 MG/2ML IJ SOLN
INTRAMUSCULAR | Status: DC | PRN
  Administered 2014-11-08: 4 mg via INTRAVENOUS

## 2014-11-08 MED ORDER — THROMBIN 20000 UNITS EX SOLR
CUTANEOUS | Status: AC
  Filled 2014-11-08: qty 20000

## 2014-11-08 MED ORDER — HYDROMORPHONE HCL 1 MG/ML IJ SOLN
INTRAMUSCULAR | Status: AC
  Filled 2014-11-08: qty 1

## 2014-11-08 MED ORDER — ROCURONIUM BROMIDE 50 MG/5ML IV SOLN
INTRAVENOUS | Status: AC
  Filled 2014-11-08: qty 1

## 2014-11-08 MED ORDER — ONDANSETRON HCL 4 MG/2ML IJ SOLN: 4.0000 mg | Freq: Four times a day (QID) | INTRAMUSCULAR | Status: DC | PRN

## 2014-11-08 MED ORDER — SUCCINYLCHOLINE CHLORIDE 20 MG/ML IJ SOLN
INTRAMUSCULAR | Status: DC | PRN
  Administered 2014-11-08: 80 mg via INTRAVENOUS

## 2014-11-08 MED ORDER — MORPHINE SULFATE 2 MG/ML IJ SOLN
2.0000 mg | INTRAMUSCULAR | Status: DC | PRN
  Administered 2014-11-08 (×2): 2 mg via INTRAVENOUS
  Administered 2014-11-08: 4 mg via INTRAVENOUS
  Filled 2014-11-08: qty 1
  Filled 2014-11-08: qty 2

## 2014-11-08 MED ORDER — HEPARIN SODIUM (PORCINE) 5000 UNIT/ML IJ SOLN
INTRAMUSCULAR | Status: DC | PRN
  Administered 2014-11-08: 500 mL

## 2014-11-08 MED ORDER — EPHEDRINE SULFATE 50 MG/ML IJ SOLN
INTRAMUSCULAR | Status: AC
  Filled 2014-11-08: qty 1

## 2014-11-08 MED ORDER — PROMETHAZINE HCL 25 MG PO TABS
25.0000 mg | ORAL_TABLET | Freq: Four times a day (QID) | ORAL | Status: DC | PRN
Start: 2014-11-08 — End: 2014-11-09

## 2014-11-08 MED ORDER — SUCCINYLCHOLINE CHLORIDE 20 MG/ML IJ SOLN
INTRAMUSCULAR | Status: AC
  Filled 2014-11-08: qty 1

## 2014-11-08 MED ORDER — LIDOCAINE HCL (CARDIAC) 20 MG/ML IV SOLN
INTRAVENOUS | Status: DC | PRN
  Administered 2014-11-08: 60 mg via INTRAVENOUS

## 2014-11-08 MED ORDER — PROPOFOL 10 MG/ML IV BOLUS
INTRAVENOUS | Status: DC | PRN
  Administered 2014-11-08: 120 mg via INTRAVENOUS

## 2014-11-08 MED ORDER — PHENYLEPHRINE HCL 10 MG/ML IJ SOLN
INTRAMUSCULAR | Status: DC | PRN
  Administered 2014-11-08 (×2): 80 ug via INTRAVENOUS

## 2014-11-08 MED ORDER — POTASSIUM CHLORIDE CRYS ER 20 MEQ PO TBCR: 20.0000 meq | EXTENDED_RELEASE_TABLET | Freq: Every day | ORAL | Status: DC | PRN

## 2014-11-08 MED ORDER — PNEUMOCOCCAL VAC POLYVALENT 25 MCG/0.5ML IJ INJ: 0.5000 mL | INJECTION | INTRAMUSCULAR | Status: DC | PRN

## 2014-11-08 MED ORDER — CINACALCET HCL 30 MG PO TABS
30.0000 mg | ORAL_TABLET | Freq: Every day | ORAL | Status: DC
  Administered 2014-11-08 – 2014-11-09 (×2): 30 mg via ORAL
  Filled 2014-11-08 (×4): qty 1

## 2014-11-08 MED ORDER — FLUDROCORTISONE ACETATE 0.1 MG PO TABS
0.1000 mg | ORAL_TABLET | ORAL | Status: DC
  Administered 2014-11-08: 0.1 mg via ORAL
  Filled 2014-11-08: qty 1

## 2014-11-08 MED ORDER — ACETAMINOPHEN 325 MG PO TABS: 325.0000 mg | ORAL_TABLET | ORAL | Status: DC | PRN

## 2014-11-08 MED ORDER — ACETAMINOPHEN 650 MG RE SUPP: 325.0000 mg | RECTAL | Status: DC | PRN

## 2014-11-08 MED ORDER — PROMETHAZINE HCL 25 MG/ML IJ SOLN
6.2500 mg | INTRAMUSCULAR | Status: DC | PRN
Start: 2014-11-08 — End: 2014-11-08

## 2014-11-08 MED ORDER — PANTOPRAZOLE SODIUM 40 MG PO TBEC
80.0000 mg | DELAYED_RELEASE_TABLET | Freq: Every day | ORAL | Status: DC
  Administered 2014-11-08 – 2014-11-09 (×2): 80 mg via ORAL
  Filled 2014-11-08 (×2): qty 2

## 2014-11-08 MED ORDER — ALUM & MAG HYDROXIDE-SIMETH 200-200-20 MG/5ML PO SUSP: 15.0000 mL | ORAL | Status: DC | PRN

## 2014-11-08 MED ORDER — NEOSTIGMINE METHYLSULFATE 10 MG/10ML IV SOLN
INTRAVENOUS | Status: DC | PRN
  Administered 2014-11-08: 4 mg via INTRAVENOUS

## 2014-11-08 MED ORDER — PHENYLEPHRINE HCL 10 MG/ML IJ SOLN
10.0000 mg | INTRAVENOUS | Status: DC | PRN
  Administered 2014-11-08: 40 ug/min via INTRAVENOUS

## 2014-11-08 MED ORDER — MIDAZOLAM HCL 5 MG/5ML IJ SOLN
INTRAMUSCULAR | Status: DC | PRN
  Administered 2014-11-08: 2 mg via INTRAVENOUS

## 2014-11-08 MED ORDER — PROPOFOL 10 MG/ML IV BOLUS
INTRAVENOUS | Status: AC
  Filled 2014-11-08: qty 20

## 2014-11-08 MED ORDER — SODIUM CHLORIDE 0.9 % IV SOLN
500.0000 mL | Freq: Once | INTRAVENOUS | Status: AC | PRN
  Administered 2014-11-08: 500 mL via INTRAVENOUS

## 2014-11-08 MED ORDER — HYDRALAZINE HCL 20 MG/ML IJ SOLN: 5.0000 mg | INTRAMUSCULAR | Status: DC | PRN

## 2014-11-08 MED ORDER — MIDAZOLAM HCL 2 MG/2ML IJ SOLN
INTRAMUSCULAR | Status: AC
  Filled 2014-11-08: qty 2

## 2014-11-08 MED ORDER — DEXTROSE 5 % IV SOLN
1.5000 g | Freq: Two times a day (BID) | INTRAVENOUS | Status: AC
  Administered 2014-11-08 – 2014-11-09 (×2): 1.5 g via INTRAVENOUS
  Filled 2014-11-08 (×2): qty 1.5

## 2014-11-08 MED ORDER — FERRIC CITRATE 1 GM 210 MG(FE) PO TABS: 1.0000 | ORAL_TABLET | Freq: Three times a day (TID) | ORAL | Status: DC

## 2014-11-08 MED ORDER — PHENOL 1.4 % MT LIQD: 1.0000 | OROMUCOSAL | Status: DC | PRN

## 2014-11-08 MED ORDER — 0.9 % SODIUM CHLORIDE (POUR BTL) OPTIME
TOPICAL | Status: DC | PRN
  Administered 2014-11-08: 1000 mL

## 2014-11-08 MED ORDER — FENTANYL CITRATE (PF) 100 MCG/2ML IJ SOLN
INTRAMUSCULAR | Status: DC | PRN
  Administered 2014-11-08 (×2): 100 ug via INTRAVENOUS

## 2014-11-08 MED ORDER — MORPHINE SULFATE 15 MG PO TABS
15.0000 mg | ORAL_TABLET | Freq: Four times a day (QID) | ORAL | Status: DC | PRN
Start: 2014-11-08 — End: 2014-11-09
  Administered 2014-11-09: 15 mg via ORAL
  Filled 2014-11-08 (×2): qty 1

## 2014-11-08 MED ORDER — VECURONIUM BROMIDE 10 MG IV SOLR
INTRAVENOUS | Status: DC | PRN
  Administered 2014-11-08: 4 mg via INTRAVENOUS

## 2014-11-08 MED ORDER — HEPARIN SODIUM (PORCINE) 1000 UNIT/ML IJ SOLN
INTRAMUSCULAR | Status: DC | PRN
  Administered 2014-11-08: 8 mL via INTRAVENOUS

## 2014-11-08 MED ORDER — SODIUM CHLORIDE 0.9 % IV SOLN
INTRAVENOUS | Status: DC
  Administered 2014-11-08: 08:00:00 via INTRAVENOUS

## 2014-11-08 MED ORDER — MAGNESIUM SULFATE 2 GM/50ML IV SOLN: 2.0000 g | Freq: Every day | INTRAVENOUS | Status: DC | PRN

## 2014-11-08 MED ORDER — IODIXANOL 320 MG/ML IV SOLN
INTRAVENOUS | Status: DC | PRN
  Administered 2014-11-08: 80.6 mL via INTRA_ARTERIAL

## 2014-11-08 MED ORDER — STERILE WATER FOR INJECTION IJ SOLN
INTRAMUSCULAR | Status: AC
  Filled 2014-11-08: qty 10

## 2014-11-08 MED ORDER — CEFUROXIME SODIUM 1.5 G IJ SOLR
INTRAMUSCULAR | Status: AC
  Filled 2014-11-08: qty 1.5

## 2014-11-08 MED ORDER — DOCUSATE SODIUM 100 MG PO CAPS
100.0000 mg | ORAL_CAPSULE | Freq: Every day | ORAL | Status: DC
  Administered 2014-11-09: 100 mg via ORAL
  Filled 2014-11-08: qty 1

## 2014-11-08 MED ORDER — MORPHINE SULFATE 2 MG/ML IJ SOLN
INTRAMUSCULAR | Status: AC
  Filled 2014-11-08: qty 1

## 2014-11-08 MED ORDER — GLYCOPYRROLATE 0.2 MG/ML IJ SOLN
INTRAMUSCULAR | Status: AC
  Filled 2014-11-08: qty 3

## 2014-11-08 MED ORDER — PREDNISONE 50 MG PO TABS
50.0000 mg | ORAL_TABLET | Freq: Every day | ORAL | Status: DC | PRN
  Filled 2014-11-08: qty 1

## 2014-11-08 MED ORDER — GUAIFENESIN-DM 100-10 MG/5ML PO SYRP: 15.0000 mL | ORAL_SOLUTION | ORAL | Status: DC | PRN

## 2014-11-08 MED ORDER — METOPROLOL TARTRATE 1 MG/ML IV SOLN: 2.0000 mg | INTRAVENOUS | Status: DC | PRN

## 2014-11-08 MED ORDER — FERROUS SULFATE 325 (65 FE) MG PO TABS
325.0000 mg | ORAL_TABLET | Freq: Three times a day (TID) | ORAL | Status: DC
  Administered 2014-11-08 – 2014-11-09 (×3): 325 mg via ORAL
  Filled 2014-11-08 (×5): qty 1

## 2014-11-08 MED ORDER — FENTANYL CITRATE (PF) 250 MCG/5ML IJ SOLN
INTRAMUSCULAR | Status: AC
  Filled 2014-11-08: qty 5

## 2014-11-08 MED ORDER — LABETALOL HCL 5 MG/ML IV SOLN: 10.0000 mg | INTRAVENOUS | Status: DC | PRN

## 2014-11-08 MED ORDER — GLYCOPYRROLATE 0.2 MG/ML IJ SOLN
INTRAMUSCULAR | Status: DC | PRN
  Administered 2014-11-08: 0.6 mg via INTRAVENOUS

## 2014-11-08 MED ORDER — PROTAMINE SULFATE 10 MG/ML IV SOLN
INTRAVENOUS | Status: DC | PRN
  Administered 2014-11-08: 20 mg via INTRAVENOUS
  Administered 2014-11-08 (×2): 30 mg via INTRAVENOUS

## 2014-11-08 MED ORDER — MEPERIDINE HCL 25 MG/ML IJ SOLN: 6.2500 mg | INTRAMUSCULAR | Status: DC | PRN

## 2014-11-08 MED ORDER — HYDROMORPHONE HCL 1 MG/ML IJ SOLN
0.2500 mg | INTRAMUSCULAR | Status: DC | PRN
  Administered 2014-11-08 (×4): 0.5 mg via INTRAVENOUS

## 2014-11-08 SURGICAL SUPPLY — 78 items
BALLN CODA OCL 2-9.0-35-120-3 (BALLOONS)
BALLOON COD OCL 2-9.0-35-120-3 (BALLOONS) IMPLANT
CANISTER SUCTION 2500CC (MISCELLANEOUS) ×2 IMPLANT
CANNULA VESSEL 3MM 2 BLNT TIP (CANNULA) IMPLANT
CATH ANGIO 5F BER2 65CM (CATHETERS) ×2 IMPLANT
CATH BEACON 5.038 65CM KMP-01 (CATHETERS) IMPLANT
CATH CROSS OVER TEMPO 5F (CATHETERS) ×2 IMPLANT
CATH OMNI FLUSH .035X70CM (CATHETERS) ×2 IMPLANT
CLIP TI MEDIUM 24 (CLIP) IMPLANT
CLIP TI WIDE RED SMALL 24 (CLIP) IMPLANT
COIL NESTER 14X12 (Vascular Products) ×12 IMPLANT
COVER MAYO STAND STRL (DRAPES) ×2 IMPLANT
DEVICE CLOSURE PERCLS PRGLD 6F (VASCULAR PRODUCTS) ×4 IMPLANT
DEVICE TORQUE KENDALL .025-038 (MISCELLANEOUS) ×2 IMPLANT
DRAIN CHANNEL 10F 3/8 F FF (DRAIN) IMPLANT
DRAIN CHANNEL 10M FLAT 3/4 FLT (DRAIN) IMPLANT
DRAPE TABLE COVER HEAVY DUTY (DRAPES) ×2 IMPLANT
DRSG TEGADERM 2-3/8X2-3/4 SM (GAUZE/BANDAGES/DRESSINGS) ×2 IMPLANT
DRYSEAL FLEXSHEATH 14FR 33CM (SHEATH) ×1
DRYSEAL FLEXSHEATH 16FR 33CM (SHEATH) ×2
ELECT CAUTERY BLADE 6.4 (BLADE) ×2 IMPLANT
ELECT REM PT RETURN 9FT ADLT (ELECTROSURGICAL) ×4
ELECTRODE REM PT RTRN 9FT ADLT (ELECTROSURGICAL) ×2 IMPLANT
EVACUATOR 3/16  PVC DRAIN (DRAIN)
EVACUATOR 3/16 PVC DRAIN (DRAIN) IMPLANT
EVACUATOR SILICONE 100CC (DRAIN) IMPLANT
EXCLUDER AORTIC EXTEND 32X4.5 (Vascular Products) ×2 IMPLANT
EXCLUDER TNK LEG 26MX14X18 (Endovascular Graft) ×1 IMPLANT
EXCLUDER TRUNK LEG 26MX14X18 (Endovascular Graft) ×2 IMPLANT
GAUZE SPONGE 2X2 8PLY STRL LF (GAUZE/BANDAGES/DRESSINGS) ×1 IMPLANT
GAUZE SPONGE 4X4 16PLY XRAY LF (GAUZE/BANDAGES/DRESSINGS) ×2 IMPLANT
GLOVE BIO SURGEON STRL SZ7.5 (GLOVE) ×2 IMPLANT
GLOVE BIOGEL PI IND STRL 6.5 (GLOVE) ×1 IMPLANT
GLOVE BIOGEL PI INDICATOR 6.5 (GLOVE) ×1
GLOVE ECLIPSE 6.5 STRL STRAW (GLOVE) ×2 IMPLANT
GOWN STRL REUS W/ TWL LRG LVL3 (GOWN DISPOSABLE) ×3 IMPLANT
GOWN STRL REUS W/TWL LRG LVL3 (GOWN DISPOSABLE) ×3
GRAFT BALLN CATH 65CM (STENTS) ×1 IMPLANT
GRAFT EXCLUDER AORTIC 28X3.3CM (Endovascular Graft) ×2 IMPLANT
GRAFT EXCLUDER LEG (Endovascular Graft) ×2 IMPLANT
GUIDEWIRE ANGLED .035X150CM (WIRE) ×2 IMPLANT
INSERT FOGARTY 61MM (MISCELLANEOUS) IMPLANT
INSERT FOGARTY SM (MISCELLANEOUS) IMPLANT
KIT BASIN OR (CUSTOM PROCEDURE TRAY) ×2 IMPLANT
KIT ROOM TURNOVER OR (KITS) ×2 IMPLANT
LEG CONTRALATERAL 23X14 (Endovascular Graft) ×2 IMPLANT
LIQUID BAND (GAUZE/BANDAGES/DRESSINGS) ×2 IMPLANT
NEEDLE PERC 18GX7CM (NEEDLE) ×2 IMPLANT
NS IRRIG 1000ML POUR BTL (IV SOLUTION) ×4 IMPLANT
PACK ENDOVASCULAR (PACKS) ×2 IMPLANT
PAD ARMBOARD 7.5X6 YLW CONV (MISCELLANEOUS) ×4 IMPLANT
PENCIL BUTTON HOLSTER BLD 10FT (ELECTRODE) IMPLANT
PERCLOSE PROGLIDE 6F (VASCULAR PRODUCTS) ×8
SHEATH AVANTI 11CM 8FR (MISCELLANEOUS) ×2 IMPLANT
SHEATH BRITE TIP 8FR 23CM (MISCELLANEOUS) ×2 IMPLANT
SHEATH DRYSEAL FLEX 14FR 33CM (SHEATH) ×1 IMPLANT
SHEATH DRYSEAL FLEX 16FR 33CM (SHEATH) ×2 IMPLANT
SPONGE GAUZE 2X2 STER 10/PKG (GAUZE/BANDAGES/DRESSINGS) ×1
SPONGE SURGIFOAM ABS GEL 100 (HEMOSTASIS) IMPLANT
STAPLER VISISTAT 35W (STAPLE) IMPLANT
STENT GRAFT BALLN CATH 65CM (STENTS) ×1
STOPCOCK MORSE 400PSI 3WAY (MISCELLANEOUS) ×2 IMPLANT
SUT PROLENE 5 0 C 1 24 (SUTURE) IMPLANT
SUT VIC AB 2-0 CT1 27 (SUTURE)
SUT VIC AB 2-0 CT1 TAPERPNT 27 (SUTURE) IMPLANT
SUT VIC AB 3-0 SH 27 (SUTURE)
SUT VIC AB 3-0 SH 27X BRD (SUTURE) IMPLANT
SUT VICRYL 4-0 PS2 18IN ABS (SUTURE) ×4 IMPLANT
SYR 20CC LL (SYRINGE) ×4 IMPLANT
SYR 30ML LL (SYRINGE) IMPLANT
SYR 5ML LL (SYRINGE) ×2 IMPLANT
SYR MEDRAD MARK V 150ML (SYRINGE) IMPLANT
SYRINGE 10CC LL (SYRINGE) ×6 IMPLANT
TRAY FOLEY W/METER SILVER 16FR (SET/KITS/TRAYS/PACK) ×2 IMPLANT
TUBING HIGH PRESSURE 120CM (CONNECTOR) ×2 IMPLANT
WATER STERILE IRR 1000ML POUR (IV SOLUTION) ×2 IMPLANT
WIRE AMPLATZ SS-J .035X180CM (WIRE) ×4 IMPLANT
WIRE BENTSON .035X145CM (WIRE) ×4 IMPLANT

## 2014-11-08 NOTE — Anesthesia Procedure Notes (Signed)
Procedure Name: Intubation Date/Time: 11/08/2014 8:57 AM Performed by: Ollen Bowl Pre-anesthesia Checklist: Patient identified, Emergency Drugs available, Suction available, Patient being monitored and Timeout performed Patient Re-evaluated:Patient Re-evaluated prior to inductionOxygen Delivery Method: Circle system utilized and Simple face mask Preoxygenation: Pre-oxygenation with 100% oxygen Intubation Type: IV induction Ventilation: Mask ventilation without difficulty Laryngoscope Size: Miller and 3 Grade View: Grade II Tube type: Oral Tube size: 7.5 mm Number of attempts: 1 Airway Equipment and Method: Patient positioned with wedge pillow and Stylet Placement Confirmation: ETT inserted through vocal cords under direct vision,  positive ETCO2 and breath sounds checked- equal and bilateral Secured at: 22 cm Tube secured with: Tape Dental Injury: Teeth and Oropharynx as per pre-operative assessment

## 2014-11-08 NOTE — H&P (View-Only) (Signed)
HISTORY AND PHYSICAL     CC:  Bilateral iliac artery aneurysms Referring Provider:  Lauree Chandler, NP  HPI: This is a 64 y.o. male who is being followed by Dr. Oneida Alar for bilateral common iliac artery aneurysms.  He was seen in April for these, but wanted to continue to observe these.    He recently underwent workup at [facility] for kidney transplant and he was told that his iliac artery aneurysms would need to be repaired prior to proceeding with transplant.   He denies any abdominal pain or any other issues.  He is ESRD and dialyzes on M/W/F at [facility].    At his last visit, it was noted that he did have a Lexiscan in December, which was a normal stress nuclear study. His LVEF was 68% and had substantially improved compared to August 2014.  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  At that time, his HIT panel was negative.    Past Medical History  Diagnosis Date  . End stage renal disease   . Hypertension   . CHF (congestive heart failure)   . Arthritis     gouty  . Chronic back pain   . Heart failure with reduced ejection fraction   . Anemia in chronic kidney disease     Past Surgical History  Procedure Laterality Date  . Av fistula placement Right 07/26/2009  . Ligation of arteriovenous  fistula Right [id number]    Procedure: PLICATION OF ARTERIOVENOUS  FISTULA;  Surgeon: Elam Dutch, MD;  Location: [place];  Service: Vascular;  Laterality: Right;  . Insertion of dialysis catheter N/A 02/22/2013    Procedure: INSERTION OF DIALYSIS CATHETER;  Surgeon: Elam Dutch, MD;  Location: [facility] OR;  Service: Vascular;  Laterality: N/A;    No Known Allergies  Current Outpatient Prescriptions  Medication Sig Dispense Refill  . calcitRIOL (ROCALTROL) 0.25 MCG capsule Take 0.25 mcg by mouth 2 (two) times daily.     . calcium acetate (PHOSLO) 667 MG capsule Take 1,334 mg by mouth 2 (two) times daily with a meal.      . oxyCODONE-acetaminophen (PERCOCET) 10-325 MG per tablet Take 1 tablet by mouth 3 (three) times daily.    . predniSONE (DELTASONE) 50 MG tablet Take 50 mg by mouth daily as needed (GOUT).     No current facility-administered medications for this visit.   Facility-Administered Medications Ordered in Other Visits  Medication Dose Route Frequency Provider Last Rate Last Dose  . vancomycin (VANCOCIN) IVPB 1000 mg/200 mL premix  1,000 mg Intravenous 60 min Pre-Op Elam Dutch, MD        Family History  Problem Relation Age of Onset  . Diabetes Father   . Hypertension Father   . Diabetes Sister     History   Social History  . Marital Status: Married    Spouse Name: N/A  . Number of Children: 2  . Years of Education: N/A   Occupational History  . Retired    Social History Main Topics  . Smoking status: Never Smoker   . Smokeless tobacco: Never Used  . Alcohol Use: No  . Drug Use: No  . Sexual Activity: Not on file   Other Topics Concern  . Not on file   Social History Narrative     ROS: [x]  Positive   [ ]  Negative   [ ]  All sytems reviewed and are negative  Cardiovascular: []   chest pain/pressure []  palpitations []  SOB lying flat []  DOE []  pain in legs while walking []  pain in feet when lying flat []  hx of DVT []  hx of phlebitis []  swelling in legs []  varicose veins  Pulmonary: []  productive cough []  asthma []  wheezing  Neurologic: []  weakness in []  arms []  legs []  numbness in []  arms []  legs [] difficulty speaking or slurred speech []  temporary loss of vision in one eye []  dizziness  Hematologic: []  bleeding problems []  problems with blood clotting easily  GI []  vomiting blood []  blood in stool  GU: []  burning with urination []  blood in urine  Psychiatric: []  hx of major depression  Integumentary: []  rashes []  ulcers  Constitutional: []  fever []  chills   PHYSICAL EXAMINATION:  Filed Vitals:   10/12/14 1410  BP: 119/79   Pulse: 90  Resp: 16   Body mass index is 22.46 kg/(m^2).  General:  WDWN in NAD Gait: Not observed HENT: WNL, normocephalic Pulmonary: normal non-labored breathing , without Rales, rhonchi,  wheezing Cardiac: RRR, without  Murmurs, rubs or gallops Abdomen: soft, NT, no masses; aorta is palpable Skin: without rashes, without ulcers  Vascular Exam/Pulses:  Right Left  Radial 2+ (normal) 2+ (normal)  Femoral 2+ (normal) 2+ (normal)  DP 2+ (normal) 2+ (normal)  PT Unable to palpate  Unable to palpate    Extremities: without ischemic changes, without Gangrene , without cellulitis; without open wounds;  Musculoskeletal: no muscle wasting or atrophy  Neurologic: A&O X 3; Appropriate Affect ; SENSATION: normal; MOTOR FUNCTION:  moving all extremities equally. Speech is fluent/normal   Non-Invasive Vascular Imaging:   CTA at [facility] 09/19/14 Results: Celiac artery measures 2cm, aneurysmal Left common iliac artery measures 3,7cm Right common iliac artery measures 2.8 cm Abdominal aorta tortuous but not aneurysmal  Pt meds includes: Statin:  No. Beta Blocker:  No. Aspirin:  No. ACEI:  No. ARB:  No. Other Antiplatelet/Anticoagulant:  No.    ASSESSMENT/PLAN:: 64 y.o. male with bilateral common iliac artery aneurysms who is ESRD on HD   -pt has been resistant to having his CIA aneurysms repaired in the past.  He recently underwent workup for kidney transplant at [facility] and was told that he has to have the aneurysms repaired to be considered for transplant and presents today for this. -we are not able to actually view the CTA from [facility].  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  His HIT was negative at that time, but given the fact he will receive heparin intraoperatively, we will repeat the HIT panel prior to surgery, which will be scheduled for November 21, 2014.   -the pt knows that if he has sudden, sharp abdominal, back or groin pain, he is to go to the ER or call 911.   Leontine Locket, PA-C [facility] [phone]  Clinic MD:  Pt seen and examined in conjunction with Dr. Oneida Alar  I agree with above. Patient has a large left common iliac artery aneurysm 3.7 cm he also has a right common iliac aneurysm that is slightly smaller. 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 He currently is asymptomatic from all of these. We will try to repair his aneurysms with endovascular technique with an eye  towards trying to preserve as much external iliac as possible in the event he needs a transplant in the future. It will be scheduled for mid July. We will check his hit profile had a time. We will also try to size him for a branched graft versus a bifurcated graft.  [name], MD [facility] [place] Office: [phone] Pager: [phone]

## 2014-11-08 NOTE — Anesthesia Preprocedure Evaluation (Addendum)
Anesthesia Evaluation  Patient identified by MRN, date of birth, ID band Patient awake    Reviewed: Allergy & Precautions, H&P , NPO status , Patient's Chart, lab work & pertinent test results  Airway Mallampati: II  TM Distance: >3 FB Neck ROM: Full    Dental  (+) Teeth Intact, Dental Advisory Given   Pulmonary neg pulmonary ROS,  breath sounds clear to auscultation        Cardiovascular hypertension, + Peripheral Vascular Disease and +CHF Rhythm:Regular Rate:Normal     Neuro/Psych negative neurological ROS  negative psych ROS   GI/Hepatic negative GI ROS, Neg liver ROS,   Endo/Other  negative endocrine ROS  Renal/GU ESRFRenal disease     Musculoskeletal negative musculoskeletal ROS (+) Arthritis -,   Abdominal   Peds  Hematology negative hematology ROS (+) anemia ,   Anesthesia Other Findings   Reproductive/Obstetrics negative OB ROS                           Anesthesia Physical  Anesthesia Plan  ASA: III  Anesthesia Plan: General   Post-op Pain Management:    Induction: Intravenous  Airway Management Planned: Oral ETT  Additional Equipment: Arterial line  Intra-op Plan:   Post-operative Plan: Extubation in OR  Informed Consent: I have reviewed the patients History and Physical, chart, labs and discussed the procedure including the risks, benefits and alternatives for the proposed anesthesia with the patient or authorized representative who has indicated his/her understanding and acceptance.   Dental advisory given  Plan Discussed with: CRNA  Anesthesia Plan Comments: (Some question of HIT in past. Lab ordered, but not drawn in PAT. Dr. Oneida Alar would like to proceed without investigating. He discussed with the patient and family. Delayed to the OR due to this.)       Anesthesia Quick Evaluation

## 2014-11-08 NOTE — Interval H&P Note (Signed)
History and Physical Interval Note:  11/08/2014 8:11 AM  Gregory [name]  has presented today for surgery, with the diagnosis of Bilateral Iliac Artery Aneurysms  I72.3  The various methods of treatment have been discussed with the patient and family. After consideration of risks, benefits and other options for treatment, the patient has consented to  Procedure(s): REPAIR OF BILATERAL ILIAC ARTERY ANEURYSM WITH BRANCHED ILIAC VERSUS BIFURCATED ILIAC DEVICE (Bilateral) as a surgical intervention .  The patient's history has been reviewed, patient examined, no change in status, stable for surgery.  I have reviewed the patient's chart and labs.  Questions were answered to the patient's satisfaction.     [name]

## 2014-11-08 NOTE — Transfer of Care (Signed)
Immediate Anesthesia Transfer of Care Note  Patient: Gregory [name]  Procedure(s) Performed: Procedure(s): REPAIR OF BILATERAL ILIAC ARTERY ANEURYSM WITH  Bifurcated stent, with coiling left internal artery (Bilateral)  Patient Location: PACU  Anesthesia Type:General  Level of Consciousness: awake, alert  and oriented  Airway & Oxygen Therapy: Patient Spontanous Breathing and Patient connected to nasal cannula oxygen  Post-op Assessment: Report given to RN, Post -op Vital signs reviewed and stable and Patient moving all extremities X 4  Post vital signs: Reviewed and stable  Last Vitals:  Filed Vitals:   11/08/14 0659  BP: 114/88  Pulse: 88  Temp: [id number] C    Complications: No apparent anesthesia complications

## 2014-11-08 NOTE — Care Management Note (Signed)
Case Management Note  Patient Details  Name: Gregory [name] MRN: [id number] Date of Birth: 10-28-1950  Subjective/Objective:                  Assessment/Plan: Pt from home  s/p  gore excluder bifurcated stent graft for repair of left common iliac aneurysm embolization left internal iliac artery.  Action/Plan:Assessment/Plan: Return to home when medically stable. CM to f/u with d/c needs.  Expected Discharge Date:                  Expected Discharge Plan:  Home/Self Care  In-House Referral:     Discharge planning Services  CM Consult  Post Acute Care Choice:    Choice offered to:     DME Arranged:    DME Agency:     HH Arranged:    HH Agency:     Status of Service:  In process, will continue to follow  Medicare Important Message Given:    Date Medicare IM Given:    Medicare IM give by:    Date Additional Medicare IM Given:    Additional Medicare Important Message give by:     If discussed at [place] of Stay Meetings, dates discussed:    Additional Comments: Gregory [name] (Spouse) [phone]  Sharin Mons, RN 11/08/2014, 8:57 PM

## 2014-11-08 NOTE — Op Note (Signed)
Procedure: Gore Excluder bifurcated stent graft for repair of left common iliac aneurysm         Embolization left internal iliac artery  PreOp: Right Common Iliac aneurysm PostOp: same  Co Surgeon: Adele Barthel, MD  Anesthesia: General  Operative findings:   #1 Bilateral Proglide closure   #2 [phone] cm main body Gore Excluder device delivered via left femoral system   #3 14 x 10 cm left iliac limb common iliac to external iliac   #4 23 x 14 contralateral iliac extension right common ilac  #5 32 x 4 contralateral iliac extension right common iliac  #6 Coil embolization of left internal iliac with nestor coils     PROCEDURE DETAIL: After obtaining informed consent the patient was taken to the operating room. The patient was placed in supine position on the operating room table. After induction of general anesthesia and endotracheal intubation a Foley catheter was placed. Next the patient was prepped and draped in usual sterile fashion from the nipples down to the knees. Ultrasound was used to identify the right common femoral artery as well as the femoral bifurcation. An 11 blade was used to make a small neck in the skin over the level of the right common femoral artery. An introducer needle was then used to cannulate the right common femoral artery without difficulty. A 0.035 Bentson wire was then threaded up into the abdominal aorta through the right femoral system. A short 9 French dilator was placed over the guidewire the right femoral system. This was used to dilate the tract. The dilator was then removed and a Proglide device inserted over the guidewire into the right femoral system and this was deployed at the 2:00 position. The Proglide device was then removed and an additional Proglide device was brought in operative field and deployed at the 10:00 position. The sutures were kept separate and tagged with suture tags. Next the short 9 French sheath was then placed back over the guidewire  into the right femoral system and the dilator removed and the sheath thoroughly flushed with heparinized saline. Attention was then turned to the left groin. Again using ultrasound the left common femoral artery was identified. The femoral bifurcation was also identified. A small nick was made in the skin with the 11 blade. A hemostat was used to dilate a tract down to the artery. An introducer needle was then used to cannulate the left common femoral artery without difficulty. A 0.035 Bentson wire was then threaded up into the abdominal aorta under fluoroscopic guidance. A 9 French dilator was then placed over the wire to dilate the tract. Two Proglide devices were again brought on operative field and these were fired sequentially in the 10:00 position followed by an additional Proglide at the 2:00 position. The Proglide delivery systems were removed and the long 9 French sheath placed back over the guidewire up to the level of the iliac of the aortic bifurcation.  A retrograde contrast angiogram was performed in a 30 degree RAO projection to identify the takeoff of the left internal iliac artery. Several attempts were made to cannulate the left internal iliac from the ipsilateral side using a crossover catheter and glidewire but these were unsuccessful.  At this point, a 5 [place] crossover catheter was placed over the guidewire and up and over the aortic bifurcation.  A Bentson wire was initially used but this could not be advanced into the contralateral external iliac so it was exchanged for an 0.035 angled glidewire.  The crossover was used to selectively catheterize the left internal iliac artery.  The glidewire was then exchanged for the Bentson wire and this was advanced deep into the internal iliac.  Several Nestor coils were then deployed into the left internal iliac filling it from the first branch point to the origin.  The sheath in the right groin was exchanged over an Amplatz wire for a 12 Fr sheath.   The Omni flush catheter was then reintroduced via the right femoral system.  An abdominal aortogram was obtained in the AP position with some cranial and LAO orientation to determine level of the left and right renal arteries. The sheath in the left groin was exchanged for a 16 fr dryseal sheath.  At this point a 26 x 14 x 18 Gore Excluder main body device was selected. The main body device was then placed over the Amplatz wire in the right groin and advanced up to the level of the renal arteries. Magnified views of the renal arteries were performed to make sure that we were not covering these. The top portion of the stent graft was then deployed with the end of the stent just below the level of the left renal artery and this came over to just below the level of the right renal artery. The main body was delivered all the way down to the contralateral gate. Attention was then turned to the right groin. The Omni flush catheter was pulled down over a guidewire and removed and the Bentson wire placed in position to cannulate the contralateral gate. The berenstein 2 catheter was placed back over the guidewire in the right femoral system. A 5 French berenstein 2 catheter was placed over this and this was used to selectively catheterize the contralateral gate and the guidewire was then advanced into the descending thoracic aorta. The main body portion of the gate was confirmed by twirling the omni flush catheter. The catheter was then placed in a location so that we could use its markers to determine the exact length to the iliac bifurcation. An Amplatz wire was placed back through the pigtail catheter. A retrograde contrast angiogram was performed to determine the level of the right internal iliac artery and a 23 x 14 cm iliac limb was selected. The catheter was removed over the guidewire and the 23 x 14 cm limb advanced so there was full coverage of the long marker on the contralateral limb. This was then deployed in the  usual fashion down to the iliac bifurcation. The delivery system was then removed. An additional bell bottom cuff was then deployed just below this 28 x 3.3.  I was not happy with the amount of fabric overlap so an additional 32 x 4 cm cuff was then used to overlap this area.  The remainder of the ipsilateral iliac limb was also deployed.  A retrograde contrast angiogram was then performed to determine length of the left iliac extension to extend into the external iliac artery.  Measurement was made of the left iliac bifurcation and a 14 x10 cm ipsilateral limb was placed.  Attention was then turned back to the left iliac system and a Gore aortic balloon placed over the wire up to the level of the proximal end of the stent and this was ballooned to profile. The limb attachment site was also ballooned as well as the distal attachment site. Attention was then turned to the left groin and the balloon was advanced over the guidewire on the left side  and the distal attachment site as well as the midportion of iliac limb were also ballooned. The 5 [place] Omni flush catheter was then placed back to the guidewire on the right side and a completion arteriogram was obtained. This showed no evidence of proximal or distal type I endoleak. There was no type II endoleak. There is no filling of the aneurysm sac.  There is no cross filling of the left internal filling the iliac aneurysm sac. The left internal iliac was occluded.  The right internal iliac was patent.  At this point the Omni flush catheter was removed over a guidewire. All delivery devices were removed. We then proceeded to remove the large 18 French sheath from the right side with the guidewire in place. With pressure held above and below the insertion site the lateral Proglide closure was secured down. There was good hemostasis. The guidewire was removed from the right side. Attention was then turned to the left groin. In similar fashion the 16 French sheath was  removed and the guidewire left in place.With pressure held above and below the insertion site the lateral and medial Proglide closures were secured down. There was good hemostasis and the Amplatz wire was removed from the left groin. The patient had been given 8000 units of heparin before introducing the main body device. This was fully reversed with 80 mg of protamine at the end of the case. Each groin puncture site was then closed with a running 4-0 Vicryl subcuticular stitch.  The patient tolerated procedure well and there were no complications. Instrument sponge and needle counts correct in the case. Patient was awakened in the operating room extubated and taken to the recovery room in stable condition.  [name], MD [facility] [place] Office: [phone] Pager: [phone]

## 2014-11-08 NOTE — Op Note (Signed)
OPERATIVE NOTE   PROCEDURE: 1. Bilateral common femoral artery cannulation under ultrasound guidance 2. "Preclose" repair of bilateral common femoral artery  3. Placement of catheter in aorta x 2 4. Aortogram 5. Embolization of left internal iliac artery  6. Placement of bifurcated aortic endograft (26 mm x 14 mm x 18 cm) 7. Placement of right iliac limb (23 mm x 14 cm) 8. Placement of cuff in right iliac artery (28 mm x 3.3 cm) 9. Placement of cuff in right iliac artery (32 mm x 4.0 cm) 10. Placement of left iliac limb extension (14 mm x 10 cm) 11. Radiologic S&I  PRE-OPERATIVE DIAGNOSIS: enlarging large iliac artery aneurysm   POST-OPERATIVE DIAGNOSIS: same as above   CO-SURGEONS: [name], MD; Adele Barthel, MD  ANESTHESIA: general  ESTIMATED BLOOD LOSS: 100 cc  FINDING(S): 1. Successful exclusion of abdominal aortic aneurysm with preservation of bilateral renal arteries and right internal iliac artery 2. No obvious endoleaks 3.  No contrast filling of left internal iliac artery  4. Dopplerable bilateral pedal artery signals  SPECIMEN(S): none  INDICATIONS:  Gregory [name] is a 64 y.o. ([birth year]) male  with enlarging large iliac artery aneurysm who presents for endovascular aortic repair. The patient is aware the risks of endovascular aortic surgery include but are not limited to: bleeding, need for transfusion, infection, death, stroke, paralysis, wound complications, bowel ischemia, extended ventilation, anaphylactic reaction to contrast, contrast induced nephropathy, embolism, and need for additional procedure to address endoleaks. Patient is also at risk for pelvic ischemia.  Overall, Dr. Oneida Alar cited a mortality rate of 1-2% and morbidity rate of 15%.  DESCRIPTION: After obtaining full informed written consent, the patient was brought back to the operating room and placed supine upon the operating table. The patient received IV antibiotics prior to  induction. After obtaining adequate anesthesia, the patient was prepped and draped in the standard fashion for: open aortic and endovascular aortic repair.   Dr. Oneida Alar first cannulated the right common femoral artery and performed the Preclose technique prior to placement of a long 8-Fr sheath in the right common femoral artery. I repeated the Preclose technique on the left common femoral artery. The wire required the aid of a BER-2 catheter to steer the wire through left large common iliac artery aneurysm into the infrarenal aorta. The regular 8-Fr sheath was loaded over a Benson wire. I then selected the left internal iliac artery with a Crosser catheter and Glidewire but the catheter would not advance into the left internal iliac artery, kicking out as it approached the orifice of the internal iliac artery.  At this point, Dr. Eden Lathe selected the left internal iliac artery from a contralateral approach.  This artery was embolized with Ashok Cordia coils as documented in Dr. Oneida Alar' notes.  After completion of the embolization, Dr. Oneida Alar redirected the wire and catheter into the infrarenal aorta.  He exchanged the wire through a pigtail catheter for an Amplatz wire. The sheath was exchanged for a 16-Fr Drysheath. The patient was given 8000 units of Heparin, a therapeutic dose bolus.  At this point, I placed a BER-2 catheter and directed a Benson wire into the infrarenal aorta.  The catheter was advanced into an infrarenal position.  The wire was exchanged for an Amplatz wire.  The left femoral sheath was exchanged for a 18-Fr sheath.    Dr. Oneida Alar loaded the pigtail catheter over the right wire and placed it in the suprarenal position. I then loaded the main body  through the left sheath and placed it in the immediate infrarenal position.  A power injector aortogram was completed to identify the location of the left renal artery, the lowest renal artery. I then deployed the main body. On completion  aortogram, the main body appeared to be 1-2 mm lower than the left renal artery.    At this point, Dr. Oneida Alar cannulated the contralateral gate (details are in his note) and verified intra-graft position for the right wire.  Based on retrograde injection measurements, a 23 m x 14 cm iliac limb was selected.  I assisted Dr. Oneida Alar with deployment of this graft.  The stent delivery device was removed.  A retrograde injection from right sheath demonstrated inadequate apposition of the limb, so a 28 mm x 3.3 cm cuff was selected to help seal the distal right common iliac artery.  This cuff was placed over the right wire in position by Dr. Eden Lathe.  I assisted Dr. Oneida Alar with deployment of the cuff.   The cuff appeared to rotate somewhat anteriorly to contact the arterial wall.  This appeared to compromise the overlap between the cuff and limb so a another cuff, 32 mm x 4 cm, was selected to cover the overlap adequately.  Dr. Oneida Alar placed the cuff and I assisted deployment of this cuff.   At this point, I fully released the main body.  I placed a 14 mm x 10 cm limb extension with adequate overlap.  The distal extent of the limb extension extended 2-3 cm distal from the likely orifice of the left external iliac artery.    At this point, Dr. passed the Q50 balloon up the right sheath and inflated the molding balloon at the proximal aortic endograft, at the flow divider, at the overlapping segments of the right distal limb, and the end of the right iliac limb. The balloon was deflated and placed on the left wire. I molded the overlapping points of the limbs and the distal end of the left iliac limb.  A pigtail catheter was loaded on the right side and a completion aortogram was completed. Based on the images, there appeared to be no endoleak. The graft was well apposed: minimal uncovered aortic neck, no endoleak was present. Both renal and right iliac arteries were patent. Left internal iliac artery did not  opacify on injection.  At this point, no further intervention was needed. The pigtail catheter was removed over a Benson wire on the right side.   Using the knot pusher, the Proglide sutures in the left groin were sequentially tightened with the wire in place. I then completed a second round of sequential suture tightening.  At this point, I removed the wire and completed a final round of sequential suture tightening.  I applied tension to the left Proglide sutures.  In a similar fashion, Dr. Oneida Alar repaired the right common femoral artery after removing the right femoral sheath. He gave the patient 80 mg of Protamine to reverse anticoagulation. I repaired the subcutaneous tissue in the Left groin with a running stitch of 4-0 Vicryl. In a similar fashion, Dr. Oneida Alar repaired the right groin incision.  Distally there were dopplerable pedal artery signals.  Note: a two surgeon approach was necessary in this case due to the complexity of the case and need for a surgeon to maintain main body stabilization until the positioning barbs were deployed with aortic molding as part of the end of the case.  COMPLICATIONS: none  CONDITION: stable  Adele Barthel, MD [facility] Office: [phone] Pager: [phone]  11/08/2014, 11:26 AM

## 2014-11-08 NOTE — Progress Notes (Signed)
  Day of Surgery Note    Subjective:  No complaints  Filed Vitals:   11/08/14 1430  BP:   Pulse: 73  Temp:   Resp:     Incisions:   Bilateral groins are soft Extremities:  2+ palpable DP pulses bilaterally Cardiac:  regular Lungs:  Non labored Abdomen:  Soft, NT/ND  Assessment/Plan:  This is a 64 y.o. male who is s/p  Gore Excluder bifurcated stent graft for repair of left common iliac aneurysm Embolization left internal iliac artery  -pt doing well this afternoon. -he does have palpable DP pulses bilaterally -anticipate discharge tomorrow   Leontine Locket, PA-C 11/08/2014 4:02 PM

## 2014-11-09 ENCOUNTER — Encounter (HOSPITAL_COMMUNITY): Payer: Self-pay | Admitting: Vascular Surgery

## 2014-11-09 ENCOUNTER — Other Ambulatory Visit: Payer: Self-pay | Admitting: *Deleted

## 2014-11-09 DIAGNOSIS — Z48812 Encounter for surgical aftercare following surgery on the circulatory system: Secondary | ICD-10-CM

## 2014-11-09 DIAGNOSIS — I739 Peripheral vascular disease, unspecified: Secondary | ICD-10-CM

## 2014-11-09 DIAGNOSIS — I771 Stricture of artery: Secondary | ICD-10-CM

## 2014-11-09 LAB — CBC
HEMATOCRIT: 37.7 % — AB (ref 39.0–52.0)
Hemoglobin: 12.4 g/dL — ABNORMAL LOW (ref 13.0–17.0)
MCH: 30.5 pg (ref 26.0–34.0)
MCHC: 32.9 g/dL (ref 30.0–36.0)
MCV: 92.9 fL (ref 78.0–100.0)
Platelets: 102 10*3/uL — ABNORMAL LOW (ref 150–400)
RBC: 4.06 MIL/uL — ABNORMAL LOW (ref 4.22–5.81)
RDW: 15.1 % (ref 11.5–15.5)
WBC: 5.2 10*3/uL (ref 4.0–10.5)

## 2014-11-09 LAB — BASIC METABOLIC PANEL
ANION GAP: 10 (ref 5–15)
BUN: 18 mg/dL (ref 6–20)
CALCIUM: 7.9 mg/dL — AB (ref 8.9–10.3)
CO2: 28 mmol/L (ref 22–32)
CREATININE: 6.22 mg/dL — AB (ref 0.61–1.24)
Chloride: 98 mmol/L — ABNORMAL LOW (ref 101–111)
GFR calc non Af Amer: 8 mL/min — ABNORMAL LOW (ref >60)
GFR, EST AFRICAN AMERICAN: 10 mL/min — AB (ref >60)
Glucose, Bld: 83 mg/dL (ref 65–99)
Potassium: 4.8 mmol/L (ref 3.5–5.1)
Sodium: 136 mmol/L (ref 135–145)

## 2014-11-09 MED ORDER — OXYCODONE HCL 5 MG PO TABS
5.0000 mg | ORAL_TABLET | Freq: Four times a day (QID) | ORAL | Status: DC | PRN
  Administered 2014-11-09: 5 mg via ORAL
  Filled 2014-11-09: qty 1

## 2014-11-09 MED ORDER — OXYCODONE HCL 5 MG PO TABS: 5.0000 mg | ORAL_TABLET | Freq: Four times a day (QID) | ORAL | Status: DC | PRN

## 2014-11-09 NOTE — Discharge Summary (Signed)
[facility] Discharge Summary   Gregory [name] 03-Nov-1950 64 y.o. male  MRN: [id number]  Admission Date: 11/08/2014  Discharge Date: 11/09/14  Physician: Elam Dutch, MD  Admission Diagnosis: Bilateral Iliac Artery Aneurysms  I72.3   HPI:   This is a 64 y.o. male who is being followed by Dr. Oneida Alar for bilateral common iliac artery aneurysms. He was seen in April for these, but wanted to continue to observe these.   He recently underwent workup at [facility] for kidney transplant and he was told that his iliac artery aneurysms would need to be repaired prior to proceeding with transplant.   He denies any abdominal pain or any other issues. He is ESRD and dialyzes on M/W/F at [facility].   At his last visit, it was noted that he did have a Lexiscan in December, which was a normal stress nuclear study. His LVEF was 68% and had substantially improved compared to August 2014.  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 At that time, his HIT panel was negative.   Hospital Course:  The patient was admitted to the hospital and taken to the operating room on 11/08/2014 and underwent: #1 Bilateral Proglide closure #2 26x14x18 cm main body Gore Excluder device delivered via left femoral system #3 14 x 10 cm left iliac limb common iliac to external iliac #4 23 x 14 contralateral iliac extension right common ilac #5 32 x 4 contralateral iliac extension right common iliac #6 Coil embolization of left internal iliac with nestor coils    The pt tolerated the procedure well and was transported to the PACU in good condition. That afternoon, he did have palpable DP's bilaterally.  By POD 1, he was doing well with palpable DP pulses bilaterally.  He did state that the po morphine was not helping his chronic back pain or his post op surgical pain.  He is instructed to discontinue his Morphine and he is given oxycodone.  He is sent home  with an Rx for Oxycodone 5mg  po q6h prn pain.  He states that he has an appt with Dr. Joaquim Lai later this month to re-evaluate his pain medication.  Discussed with Dr. Oneida Alar and he agrees.  The remainder of the hospital course consisted of increasing mobilization and increasing intake of solids without difficulty.  CBC    Component Value Date/Time   WBC 5.2 11/09/2014 0600   WBC 4.7 02/23/2014 0948   RBC 4.06* 11/09/2014 0600   RBC 4.28 02/23/2014 0948   HGB 12.4* 11/09/2014 0600   HCT 37.7* 11/09/2014 0600   PLT 102* 11/09/2014 0600   MCV 92.9 11/09/2014 0600   MCH 30.5 11/09/2014 0600   MCH 31.1 02/23/2014 0948   MCHC 32.9 11/09/2014 0600   MCHC 33.4 02/23/2014 0948   RDW 15.1 11/09/2014 0600   RDW 14.4 02/23/2014 0948   LYMPHSABS 0.8 02/23/2014 0948   EOSABS 0.0 02/23/2014 0948   BASOSABS 0.0 02/23/2014 0948    BMET    Component Value Date/Time   NA 136 11/09/2014 0600   NA 141 02/23/2014 0948   K 4.8 11/09/2014 0600   CL 98* 11/09/2014 0600   CO2 28 11/09/2014 0600   GLUCOSE 83 11/09/2014 0600   GLUCOSE 81 02/23/2014 0948   BUN 18 11/09/2014 0600   BUN 21 02/23/2014 0948   CREATININE 6.22* 11/09/2014 0600   CREATININE 5.28* 11/24/2013 1008   CALCIUM 7.9* 11/09/2014 0600   GFRNONAA 8*  11/09/2014 0600   GFRAA 10* 11/09/2014 0600       Discharge Instructions    Call MD for:  redness, tenderness, or signs of infection (pain, swelling, bleeding, redness, odor or green/yellow discharge around incision site)    Complete by:  As directed      Call MD for:  severe or increased pain, loss or decreased feeling  in affected limb(s)    Complete by:  As directed      Call MD for:  temperature >100.5    Complete by:  As directed      Discharge wound care:    Complete by:  As directed   Shower daily with soap and water starting 11/10/14     Driving Restrictions    Complete by:  As directed   No driving for 2 weeks     Lifting restrictions    Complete by:  As directed    No lifting for 4 weeks     Resume previous diet    Complete by:  As directed            Discharge Diagnosis:  Bilateral Iliac Artery Aneurysms  I72.3  Secondary Diagnosis: Patient Active Problem List   Diagnosis Date Noted  . Bilateral iliac artery aneurysm 11/08/2014  . Preop cardiovascular exam 03/09/2014  . Anemia in chronic kidney disease   . Aneurysm of iliac artery 12/01/2013  . Gout 08/25/2013  . Other complications due to renal dialysis device, implant, and graft 02/03/2013  . 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 Anemia in chronic kidney disease(285.21) 01/27/2013  . End stage renal disease 01/27/2013  . HFrEF (heart failure with reduced ejection fraction) 12/20/2012  . UTI (urinary tract infection) 12/20/2012  . 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 Decubitus ulcer of buttock, stage 3 12/20/2012  . ESRD on dialysis 12/17/2012  . 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 Chronic kidney disease, stage V 12/14/2012  . Essential hypertension, benign 12/14/2012  . Anasarca 12/14/2012  . Back pain 12/14/2012   Past Medical History  Diagnosis Date  . Hypertension   . CHF (congestive heart failure)   . Chronic back pain     "mostly lower" (11/08/2014)  . Heart failure with reduced ejection fraction   . Gout     "hands" (11/08/2014)  . GERD (gastroesophageal reflux disease)     "sometimes" (11/08/2014)  . Arthritis     "hands; basically all my joints" (11/08/2014)  . End stage renal disease     pt does not urinate; pt. states that he does dialysis on MWF; [place]" (11/08/2014)  . Anemia in chronic kidney disease        Medication List    STOP taking these medications        morphine 15 MG tablet  Commonly known as:  MSIR      TAKE these medications        AURYXIA 210 MG Tabs  Generic drug:  Ferric Citrate  Take 1 capsule by mouth 3 (three) times daily with meals.     fludrocortisone 0.1 MG  tablet  Commonly known as:  FLORINEF  Take 0.1 mg by mouth 3 (three) times a week.     omeprazole 40 MG capsule  Commonly known as:  PRILOSEC  Take 40 mg by mouth daily.     oxyCODONE 5 MG immediate release tablet  Commonly known as:  ROXICODONE  Take 1 tablet (5 mg total) by mouth every 6 (  six) hours as needed.     predniSONE 50 MG tablet  Commonly known as:  DELTASONE  Take 50 mg by mouth daily as needed (GOUT).     promethazine 25 MG tablet  Commonly known as:  PHENERGAN  Take 25 mg by mouth every 6 (six) hours as needed for nausea or vomiting.     SENSIPAR 30 MG tablet  Generic drug:  cinacalcet  Take 30 mg by mouth daily.        Prescriptions given: Roxicodone #20 No Refill-He states that he takes po Morphine, but this is not helping his chronic back pain and he is getting no relief with his post op pain.  He is given this Rx and states that he has appt with Dr. Joaquim Lai this month to re-evaluate his pain medication.  Disposition: home.  He will resume his normal HD schedule on Friday.  Patient's condition: is Good  Follow up: 1. Dr. Oneida Alar in 4 weeks with CTA   Leontine Locket, PA-C [facility] [phone] 11/09/2014  7:36 AM   - For VQI Registry use --- Instructions: Press F2 to tab through selections.  Delete question if not applicable.   Post-op:  Time to Extubation: [x]  In OR, [ ]  < 12 hrs, [ ]  12-24 hrs, [ ]  >=24 hrs Vasopressors Req. Post-op: No MI: No., [ ]  Troponin only, [ ]  EKG or Clinical New Arrhythmia: No CHF: No ICU Stay: 1 day in stepdown Transfusion: No  If yes, n/a units given  Complications: Resp failure: No., [ ]  Pneumonia, [ ]  Ventilator Chg in renal function: No., [ ]  Inc. Cr > 0.5, [ ]  Temp. Dialysis, [ ]  Permanent dialysis Leg ischemia: No., no Surgery needed, [ ]  Yes, Surgery needed, [ ]  Amputation Bowel ischemia: No., [ ]  Medical Rx, [ ]  Surgical Rx Wound complication: No., [ ]  Superficial separation/infection, [  ] Return to OR Return to OR: No  Return to OR for bleeding: No Stroke: No., [ ]  Minor, [ ]  Major  Discharge medications: Statin use:  No If No: [ ]  For Medical reasons, [ ]  Non-compliant, [ ]  Not-indicated ASA use:  No  If No: [ ]  For Medical reasons, [ ]  Non-compliant, [ ]  Not-indicated Plavix use:  No If No: [ ]  For Medical reasons, [ ]  Non-compliant, [ ]  Not-indicated Beta blocker use:  No If No: [ ]  For Medical reasons, [ ]  Non-compliant, [ ]  Not-indicated

## 2014-11-09 NOTE — Progress Notes (Signed)
Markham Jordan to be D/C'd Home per MD order.  Discussed with the patient and all questions fully answered.  VSS, Skin clean, dry and intact without evidence of skin break down, no evidence of skin tears noted. IV catheter discontinued intact. Site without signs and symptoms of complications. Dressing and pressure applied.  An After Visit Summary was printed and given to the patient. Patient received prescription.  D/c education completed with patient/family including follow up instructions, medication list, d/c activities limitations if indicated, with other d/c instructions as indicated by MD - patient able to verbalize understanding, all questions fully answered.   Patient instructed to return to ED, call 911, or call MD for any changes in condition.   Patient escorted via [place], and D/C home via private auto.  Merlene Laughter [facility] 11/09/2014 11:27 AM

## 2014-11-09 NOTE — Progress Notes (Addendum)
  Progress Note    11/09/2014 7:23 AM 1 Day Post-Op  Subjective:  C/o pain meds not working-otherwise, ready to go home  Afebrile HR 60's-90's NSR 123XX123 systolic 123XX123 RA  Filed Vitals:   11/09/14 0528  BP: 98/65  Pulse: 66  Temp: 98 F (36.7 C)  Resp: 11    Physical Exam: Cardiac:  regular Lungs:  Non labored Incisions:  Bilateral groins are soft without hematoma Extremities:  2+ palpable DP pulses bilaterally Abdomen:  Soft, NT/ND  CBC    Component Value Date/Time   WBC 4.0 11/08/2014 1214   WBC 4.7 02/23/2014 0948   RBC 4.42 11/08/2014 1214   RBC 4.28 02/23/2014 0948   HGB 13.1 11/08/2014 1214   HCT 40.9 11/08/2014 1214   PLT 95* 11/08/2014 1214   MCV 92.5 11/08/2014 1214   MCH 29.6 11/08/2014 1214   MCH 31.1 02/23/2014 0948   MCHC 32.0 11/08/2014 1214   MCHC 33.4 02/23/2014 0948   RDW 14.9 11/08/2014 1214   RDW 14.4 02/23/2014 0948   LYMPHSABS 0.8 02/23/2014 0948   EOSABS 0.0 02/23/2014 0948   BASOSABS 0.0 02/23/2014 0948    BMET    Component Value Date/Time   NA 136 11/09/2014 0600   NA 141 02/23/2014 0948   K 4.8 11/09/2014 0600   CL 98* 11/09/2014 0600   CO2 28 11/09/2014 0600   GLUCOSE 83 11/09/2014 0600   GLUCOSE 81 02/23/2014 0948   BUN 18 11/09/2014 0600   BUN 21 02/23/2014 0948   CREATININE 6.22* 11/09/2014 0600   CREATININE 5.28* 11/24/2013 1008   CALCIUM 7.9* 11/09/2014 0600   GFRNONAA 8* 11/09/2014 0600   GFRAA 10* 11/09/2014 0600    INR    Component Value Date/Time   INR 1.22 11/08/2014 1214     Intake/Output Summary (Last 24 hours) at 11/09/14 0723 Last data filed at 11/09/14 0000  Gross per 24 hour  Intake   1420 ml  Output    300 ml  Net   1120 ml     Assessment:  64 y.o. male is s/p:  Gore Excluder bifurcated stent graft for repair of left common iliac aneurysm Embolization left internal iliac artery  1 Day Post-Op  Plan: -pt doing well this am -he does have palpable DP's bilaterally -he dialyzes  M/W/F - he dialyzed M/T and will resume his schedule on Friday -he is on Morphine po 15mg  q6h for low back pain,, which he sees Dr. Jeri Cos.  Will give him an Rx for Oxycodone 5 mg q6h prn #20 until he can get into see Dr. Joaquim Lai.  He states he has an appt this month. -d/c home later this morning after he walks and eats. -pt not on a statin-Dr. Oneida Alar to decide if pt should be on statin   Leontine Locket, PA-C [facility] [phone] 11/09/2014 7:23 AM    Agree with above.  Doing well from aneurysm standpoint.  D/c home today  [name], MD [facility] [place] Office: [phone] Pager: [phone]

## 2014-11-10 ENCOUNTER — Telehealth: Payer: Self-pay | Admitting: Vascular Surgery

## 2014-11-10 DIAGNOSIS — D631 Anemia in chronic kidney disease: Secondary | ICD-10-CM | POA: Diagnosis not present

## 2014-11-10 DIAGNOSIS — N186 End stage renal disease: Secondary | ICD-10-CM | POA: Diagnosis not present

## 2014-11-10 DIAGNOSIS — E875 Hyperkalemia: Secondary | ICD-10-CM | POA: Diagnosis not present

## 2014-11-10 DIAGNOSIS — N2581 Secondary hyperparathyroidism of renal origin: Secondary | ICD-10-CM | POA: Diagnosis not present

## 2014-11-10 NOTE — Anesthesia Postprocedure Evaluation (Signed)
Anesthesia Post Note  Patient: Gregory [name]  Procedure(s) Performed: Procedure(s) (LRB): REPAIR OF BILATERAL ILIAC ARTERY ANEURYSM WITH  Bifurcated stent, with coiling left internal artery (Bilateral)  Anesthesia type: General  Patient location: PACU  Post pain: Pain level controlled  Post assessment: Post-op Vital signs reviewed  Last Vitals: BP 105/75 mmHg  Pulse 72  Temp(Src) 36.7 C (Oral)  Resp 18  Ht 6\' 3"  (1.905 m)  Wt 171 lb 15.3 oz (78 kg)  BMI 21.49 kg/m2  SpO2 100%  Post vital signs: Reviewed  Level of consciousness: sedated  Complications: No apparent anesthesia complications

## 2014-11-10 NOTE — Telephone Encounter (Addendum)
-----   Message from Mena Goes, RN sent at 11/09/2014  9:15 AM EDT ----- Regarding: Schedule May be a duplicate ----- Message -----    From: Gabriel Earing, PA-C    Sent: 11/09/2014   7:31 AM      To: [facility] Charge Pool  S/p  Gore Excluder bifurcated stent graft for repair of left common iliac aneurysm Embolization left internal iliac artery  F/u with Dr. Oneida Alar in 4 weeks with CTA protocol.  Thanks, Samantha   notified patient of cta appt. on 11-30-14 8:40  am  and post op with dr. Oneida Alar on 12-07-14 at 8:30

## 2014-11-13 DIAGNOSIS — E875 Hyperkalemia: Secondary | ICD-10-CM | POA: Diagnosis not present

## 2014-11-13 DIAGNOSIS — D631 Anemia in chronic kidney disease: Secondary | ICD-10-CM | POA: Diagnosis not present

## 2014-11-13 DIAGNOSIS — N186 End stage renal disease: Secondary | ICD-10-CM | POA: Diagnosis not present

## 2014-11-13 DIAGNOSIS — N2581 Secondary hyperparathyroidism of renal origin: Secondary | ICD-10-CM | POA: Diagnosis not present

## 2014-11-14 ENCOUNTER — Encounter: Payer: Medicare Other | Admitting: Nurse Practitioner

## 2014-11-15 DIAGNOSIS — N186 End stage renal disease: Secondary | ICD-10-CM | POA: Diagnosis not present

## 2014-11-15 DIAGNOSIS — E875 Hyperkalemia: Secondary | ICD-10-CM | POA: Diagnosis not present

## 2014-11-15 DIAGNOSIS — D631 Anemia in chronic kidney disease: Secondary | ICD-10-CM | POA: Diagnosis not present

## 2014-11-15 DIAGNOSIS — N2581 Secondary hyperparathyroidism of renal origin: Secondary | ICD-10-CM | POA: Diagnosis not present

## 2014-11-17 DIAGNOSIS — E875 Hyperkalemia: Secondary | ICD-10-CM | POA: Diagnosis not present

## 2014-11-17 DIAGNOSIS — N2581 Secondary hyperparathyroidism of renal origin: Secondary | ICD-10-CM | POA: Diagnosis not present

## 2014-11-17 DIAGNOSIS — N186 End stage renal disease: Secondary | ICD-10-CM | POA: Diagnosis not present

## 2014-11-17 DIAGNOSIS — D631 Anemia in chronic kidney disease: Secondary | ICD-10-CM | POA: Diagnosis not present

## 2014-11-20 DIAGNOSIS — E875 Hyperkalemia: Secondary | ICD-10-CM | POA: Diagnosis not present

## 2014-11-20 DIAGNOSIS — N186 End stage renal disease: Secondary | ICD-10-CM | POA: Diagnosis not present

## 2014-11-20 DIAGNOSIS — N2581 Secondary hyperparathyroidism of renal origin: Secondary | ICD-10-CM | POA: Diagnosis not present

## 2014-11-20 DIAGNOSIS — D631 Anemia in chronic kidney disease: Secondary | ICD-10-CM | POA: Diagnosis not present

## 2014-11-22 DIAGNOSIS — N186 End stage renal disease: Secondary | ICD-10-CM | POA: Diagnosis not present

## 2014-11-22 DIAGNOSIS — D631 Anemia in chronic kidney disease: Secondary | ICD-10-CM | POA: Diagnosis not present

## 2014-11-22 DIAGNOSIS — E875 Hyperkalemia: Secondary | ICD-10-CM | POA: Diagnosis not present

## 2014-11-22 DIAGNOSIS — N2581 Secondary hyperparathyroidism of renal origin: Secondary | ICD-10-CM | POA: Diagnosis not present

## 2014-11-24 DIAGNOSIS — N2581 Secondary hyperparathyroidism of renal origin: Secondary | ICD-10-CM | POA: Diagnosis not present

## 2014-11-24 DIAGNOSIS — D631 Anemia in chronic kidney disease: Secondary | ICD-10-CM | POA: Diagnosis not present

## 2014-11-24 DIAGNOSIS — E875 Hyperkalemia: Secondary | ICD-10-CM | POA: Diagnosis not present

## 2014-11-24 DIAGNOSIS — N186 End stage renal disease: Secondary | ICD-10-CM | POA: Diagnosis not present

## 2014-11-27 DIAGNOSIS — N2581 Secondary hyperparathyroidism of renal origin: Secondary | ICD-10-CM | POA: Diagnosis not present

## 2014-11-27 DIAGNOSIS — E875 Hyperkalemia: Secondary | ICD-10-CM | POA: Diagnosis not present

## 2014-11-27 DIAGNOSIS — N186 End stage renal disease: Secondary | ICD-10-CM | POA: Diagnosis not present

## 2014-11-27 DIAGNOSIS — D631 Anemia in chronic kidney disease: Secondary | ICD-10-CM | POA: Diagnosis not present

## 2014-11-29 DIAGNOSIS — E875 Hyperkalemia: Secondary | ICD-10-CM | POA: Diagnosis not present

## 2014-11-29 DIAGNOSIS — N186 End stage renal disease: Secondary | ICD-10-CM | POA: Diagnosis not present

## 2014-11-29 DIAGNOSIS — N2581 Secondary hyperparathyroidism of renal origin: Secondary | ICD-10-CM | POA: Diagnosis not present

## 2014-11-29 DIAGNOSIS — D631 Anemia in chronic kidney disease: Secondary | ICD-10-CM | POA: Diagnosis not present

## 2014-11-30 ENCOUNTER — Ambulatory Visit
Admission: RE | Admit: 2014-11-30 | Discharge: 2014-11-30 | Disposition: A | Discharge status: Home / Self Care | Payer: Medicare Other | Source: Ambulatory Visit | Attending: Vascular Surgery | Admitting: Vascular Surgery

## 2014-11-30 DIAGNOSIS — I739 Peripheral vascular disease, unspecified: Secondary | ICD-10-CM

## 2014-11-30 DIAGNOSIS — Z48812 Encounter for surgical aftercare following surgery on the circulatory system: Secondary | ICD-10-CM

## 2014-11-30 DIAGNOSIS — I728 Aneurysm of other specified arteries: Secondary | ICD-10-CM | POA: Diagnosis not present

## 2014-11-30 DIAGNOSIS — I771 Stricture of artery: Secondary | ICD-10-CM

## 2014-11-30 DIAGNOSIS — I723 Aneurysm of iliac artery: Secondary | ICD-10-CM | POA: Diagnosis not present

## 2014-11-30 MED ORDER — IOPAMIDOL (ISOVUE-370) INJECTION 76%
75.0000 mL | Freq: Once | INTRAVENOUS | Status: AC | PRN
  Administered 2014-11-30: 75 mL via INTRAVENOUS

## 2014-12-01 DIAGNOSIS — N2581 Secondary hyperparathyroidism of renal origin: Secondary | ICD-10-CM | POA: Diagnosis not present

## 2014-12-01 DIAGNOSIS — D631 Anemia in chronic kidney disease: Secondary | ICD-10-CM | POA: Diagnosis not present

## 2014-12-01 DIAGNOSIS — N186 End stage renal disease: Secondary | ICD-10-CM | POA: Diagnosis not present

## 2014-12-01 DIAGNOSIS — E875 Hyperkalemia: Secondary | ICD-10-CM | POA: Diagnosis not present

## 2014-12-03 DIAGNOSIS — I120 Hypertensive chronic kidney disease with stage 5 chronic kidney disease or end stage renal disease: Secondary | ICD-10-CM | POA: Diagnosis not present

## 2014-12-03 DIAGNOSIS — N186 End stage renal disease: Secondary | ICD-10-CM | POA: Diagnosis not present

## 2014-12-03 DIAGNOSIS — Z992 Dependence on renal dialysis: Secondary | ICD-10-CM | POA: Diagnosis not present

## 2014-12-04 DIAGNOSIS — N2581 Secondary hyperparathyroidism of renal origin: Secondary | ICD-10-CM | POA: Diagnosis not present

## 2014-12-04 DIAGNOSIS — D509 Iron deficiency anemia, unspecified: Secondary | ICD-10-CM | POA: Diagnosis not present

## 2014-12-04 DIAGNOSIS — N186 End stage renal disease: Secondary | ICD-10-CM | POA: Diagnosis not present

## 2014-12-04 DIAGNOSIS — D631 Anemia in chronic kidney disease: Secondary | ICD-10-CM | POA: Diagnosis not present

## 2014-12-05 ENCOUNTER — Encounter: Payer: Self-pay | Admitting: Vascular Surgery

## 2014-12-06 DIAGNOSIS — D631 Anemia in chronic kidney disease: Secondary | ICD-10-CM | POA: Diagnosis not present

## 2014-12-06 DIAGNOSIS — N186 End stage renal disease: Secondary | ICD-10-CM | POA: Diagnosis not present

## 2014-12-06 DIAGNOSIS — N2581 Secondary hyperparathyroidism of renal origin: Secondary | ICD-10-CM | POA: Diagnosis not present

## 2014-12-06 DIAGNOSIS — D509 Iron deficiency anemia, unspecified: Secondary | ICD-10-CM | POA: Diagnosis not present

## 2014-12-07 ENCOUNTER — Encounter: Payer: Self-pay | Admitting: Vascular Surgery

## 2014-12-07 ENCOUNTER — Ambulatory Visit (INDEPENDENT_AMBULATORY_CARE_PROVIDER_SITE_OTHER): Payer: Self-pay | Admitting: Vascular Surgery

## 2014-12-07 VITALS — BP 109/76 | HR 90 | Temp 98.7°F | Resp 12 | Ht 74.0 in | Wt 175.0 lb

## 2014-12-07 DIAGNOSIS — I728 Aneurysm of other specified arteries: Secondary | ICD-10-CM

## 2014-12-07 DIAGNOSIS — I723 Aneurysm of iliac artery: Secondary | ICD-10-CM

## 2014-12-07 NOTE — Progress Notes (Signed)
[facility] [place] HISTORY AND PHYSICAL    History of Present Illness:  Patient is a 64 y.o.64 y.o. year old male who presents for follow-up evaluation. He underwent Gore Excluder aneurysm stent graft repair of bilateral common iliac aneurysms. The patient denies new abdominal or back pain.  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  Past Medical History  Diagnosis Date  . Hypertension   . CHF (congestive heart failure)   . Chronic back pain     "mostly lower" (11/08/2014)  . Heart failure with reduced ejection fraction   . Gout     "hands" (11/08/2014)  . GERD (gastroesophageal reflux disease)     "sometimes" (11/08/2014)  . Arthritis     "hands; basically all my joints" (11/08/2014)  . End stage renal disease     pt does not urinate; pt. states that he does dialysis on MWF; [place]" (11/08/2014)  . Anemia in chronic kidney disease      Past Surgical History  Procedure Laterality Date  . Av fistula placement Right 07/26/2009  . Ligation of arteriovenous  fistula Right 123456    Procedure: PLICATION OF ARTERIOVENOUS  FISTULA;  Surgeon: Elam Dutch, MD;  Location: [facility];  Service: Vascular;  Laterality: Right;  . Insertion of dialysis catheter N/A 02/22/2013    Procedure: INSERTION OF DIALYSIS CATHETER;  Surgeon: Elam Dutch, MD;  Location: [facility] OR;  Service: Vascular;  Laterality: N/A;  . Embolization Left 11/08/2014    internal iliac artery; Gore Excluder bifurcated stent graft for repair of common iliac aneurysm  . Endovascular stent insertion Bilateral 11/08/2014    Procedure: REPAIR OF BILATERAL ILIAC ARTERY ANEURYSM WITH  Bifurcated stent, with coiling left internal artery;  Surgeon: Elam Dutch, MD;  Location: [facility] OR;  Service: Vascular;  Laterality: Bilateral;       Review of Systems:  Neurologic: denies symptoms of TIA, amaurosis, or stroke Cardiac:denies shortness of breath or chest pain Pulmonary:  denies cough or wheeze Abdomen: denies abdominal pain nausea or vomiting  History   Social History  . Marital Status: Married    Spouse Name: N/A  . Number of Children: 2  . Years of Education: N/A   Occupational History  . Retired    Social History Main Topics  . Smoking status: Never Smoker   . Smokeless tobacco: Never Used  . Alcohol Use: No  . Drug Use: No  . Sexual Activity: Not Currently   Other Topics Concern  . Not on file   Social History Narrative    No Known Allergies  Current Outpatient Prescriptions on File Prior to Visit  Medication Sig Dispense Refill  . Ferric Citrate (AURYXIA) 210 MG TABS Take 1 capsule by mouth 3 (three) times daily with meals.    . fludrocortisone (FLORINEF) 0.1 MG tablet Take 0.1 mg by mouth 3 (three) times a week.    Marland Kitchen omeprazole (PRILOSEC) 40 MG capsule Take 40 mg by mouth daily.    Marland Kitchen oxyCODONE (ROXICODONE) 5 MG immediate release tablet Take 1 tablet (5 mg total) by mouth every 6 (six) hours as needed. 20 tablet 0  . predniSONE (DELTASONE) 50 MG tablet Take 50 mg by mouth daily as needed (GOUT).    . promethazine (PHENERGAN) 25 MG tablet Take 25 mg by mouth every 6 (six) hours as needed for nausea or vomiting.    . SENSIPAR 30 MG tablet Take 30 mg by mouth daily.  Current Facility-Administered Medications on File Prior to Visit  Medication Dose Route Frequency Provider Last Rate Last Dose  . vancomycin (VANCOCIN) IVPB 1000 mg/200 mL premix  1,000 mg Intravenous 60 min Pre-Op Elam Dutch, MD           Physical Examination    Filed Vitals:   12/07/14 0837  BP: 109/76  Pulse: 90  Temp: 98.7 F (37.1 C)  TempSrc: Oral  Resp: 12  Height: 6\' 2"  (1.88 m)  Weight: 175 lb (79.379 kg)  SpO2: 99%     General:  Alert and oriented, no acute distress Abdomen: Soft, non-tender, non-distended, normal bowel sounds, no pulsatile mass Extremities: 2+ femoral pulses   DATA:  CT angiogram the abdomen and pelvis images were  reviewed today. Right and left common iliac aneurysms are well excluded including embolization of the left internal iliac artery. Right common iliac artery was 3.2 cm left 4.1 cm. 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 He will have a CT angiogram at that time. This will also give [place] follow-up on his mesenteric aneurysms. [name], MD [facility] [place] Office: [phone] Pager: [phone]

## 2014-12-08 DIAGNOSIS — N186 End stage renal disease: Secondary | ICD-10-CM | POA: Diagnosis not present

## 2014-12-08 DIAGNOSIS — D631 Anemia in chronic kidney disease: Secondary | ICD-10-CM | POA: Diagnosis not present

## 2014-12-08 DIAGNOSIS — D509 Iron deficiency anemia, unspecified: Secondary | ICD-10-CM | POA: Diagnosis not present

## 2014-12-08 DIAGNOSIS — N2581 Secondary hyperparathyroidism of renal origin: Secondary | ICD-10-CM | POA: Diagnosis not present

## 2014-12-11 ENCOUNTER — Telehealth: Payer: Self-pay | Admitting: *Deleted

## 2014-12-11 DIAGNOSIS — N186 End stage renal disease: Secondary | ICD-10-CM | POA: Diagnosis not present

## 2014-12-11 DIAGNOSIS — D631 Anemia in chronic kidney disease: Secondary | ICD-10-CM | POA: Diagnosis not present

## 2014-12-11 DIAGNOSIS — D509 Iron deficiency anemia, unspecified: Secondary | ICD-10-CM | POA: Diagnosis not present

## 2014-12-11 DIAGNOSIS — N2581 Secondary hyperparathyroidism of renal origin: Secondary | ICD-10-CM | POA: Diagnosis not present

## 2014-12-11 NOTE — Addendum Note (Signed)
Addended by: Dorthula Rue L on: 12/11/2014 09:42 AM   Modules accepted: Orders

## 2014-12-11 NOTE — Telephone Encounter (Signed)
[facility] for patient to call the office needs to set-up a follow-up visit, should have been scheduled around August 25, 2014 per Gregory [name].

## 2014-12-12 ENCOUNTER — Telehealth: Payer: Self-pay

## 2014-12-12 NOTE — Telephone Encounter (Signed)
Called patients phone number no answer, left message for him to call office to make appointment to be seen.

## 2014-12-13 DIAGNOSIS — D509 Iron deficiency anemia, unspecified: Secondary | ICD-10-CM | POA: Diagnosis not present

## 2014-12-13 DIAGNOSIS — N186 End stage renal disease: Secondary | ICD-10-CM | POA: Diagnosis not present

## 2014-12-13 DIAGNOSIS — N2581 Secondary hyperparathyroidism of renal origin: Secondary | ICD-10-CM | POA: Diagnosis not present

## 2014-12-13 DIAGNOSIS — D631 Anemia in chronic kidney disease: Secondary | ICD-10-CM | POA: Diagnosis not present

## 2014-12-15 DIAGNOSIS — N2581 Secondary hyperparathyroidism of renal origin: Secondary | ICD-10-CM | POA: Diagnosis not present

## 2014-12-15 DIAGNOSIS — D631 Anemia in chronic kidney disease: Secondary | ICD-10-CM | POA: Diagnosis not present

## 2014-12-15 DIAGNOSIS — D509 Iron deficiency anemia, unspecified: Secondary | ICD-10-CM | POA: Diagnosis not present

## 2014-12-15 DIAGNOSIS — N186 End stage renal disease: Secondary | ICD-10-CM | POA: Diagnosis not present

## 2014-12-18 DIAGNOSIS — D509 Iron deficiency anemia, unspecified: Secondary | ICD-10-CM | POA: Diagnosis not present

## 2014-12-18 DIAGNOSIS — N186 End stage renal disease: Secondary | ICD-10-CM | POA: Diagnosis not present

## 2014-12-18 DIAGNOSIS — N2581 Secondary hyperparathyroidism of renal origin: Secondary | ICD-10-CM | POA: Diagnosis not present

## 2014-12-18 DIAGNOSIS — D631 Anemia in chronic kidney disease: Secondary | ICD-10-CM | POA: Diagnosis not present

## 2014-12-20 DIAGNOSIS — N2581 Secondary hyperparathyroidism of renal origin: Secondary | ICD-10-CM | POA: Diagnosis not present

## 2014-12-20 DIAGNOSIS — D631 Anemia in chronic kidney disease: Secondary | ICD-10-CM | POA: Diagnosis not present

## 2014-12-20 DIAGNOSIS — D509 Iron deficiency anemia, unspecified: Secondary | ICD-10-CM | POA: Diagnosis not present

## 2014-12-20 DIAGNOSIS — N186 End stage renal disease: Secondary | ICD-10-CM | POA: Diagnosis not present

## 2014-12-22 DIAGNOSIS — D631 Anemia in chronic kidney disease: Secondary | ICD-10-CM | POA: Diagnosis not present

## 2014-12-22 DIAGNOSIS — D509 Iron deficiency anemia, unspecified: Secondary | ICD-10-CM | POA: Diagnosis not present

## 2014-12-22 DIAGNOSIS — N2581 Secondary hyperparathyroidism of renal origin: Secondary | ICD-10-CM | POA: Diagnosis not present

## 2014-12-22 DIAGNOSIS — N186 End stage renal disease: Secondary | ICD-10-CM | POA: Diagnosis not present

## 2014-12-25 DIAGNOSIS — N2581 Secondary hyperparathyroidism of renal origin: Secondary | ICD-10-CM | POA: Diagnosis not present

## 2014-12-25 DIAGNOSIS — N186 End stage renal disease: Secondary | ICD-10-CM | POA: Diagnosis not present

## 2014-12-25 DIAGNOSIS — D631 Anemia in chronic kidney disease: Secondary | ICD-10-CM | POA: Diagnosis not present

## 2014-12-25 DIAGNOSIS — D509 Iron deficiency anemia, unspecified: Secondary | ICD-10-CM | POA: Diagnosis not present

## 2014-12-27 DIAGNOSIS — N186 End stage renal disease: Secondary | ICD-10-CM | POA: Diagnosis not present

## 2014-12-27 DIAGNOSIS — N2581 Secondary hyperparathyroidism of renal origin: Secondary | ICD-10-CM | POA: Diagnosis not present

## 2014-12-27 DIAGNOSIS — D509 Iron deficiency anemia, unspecified: Secondary | ICD-10-CM | POA: Diagnosis not present

## 2014-12-27 DIAGNOSIS — D631 Anemia in chronic kidney disease: Secondary | ICD-10-CM | POA: Diagnosis not present

## 2014-12-29 DIAGNOSIS — D631 Anemia in chronic kidney disease: Secondary | ICD-10-CM | POA: Diagnosis not present

## 2014-12-29 DIAGNOSIS — N2581 Secondary hyperparathyroidism of renal origin: Secondary | ICD-10-CM | POA: Diagnosis not present

## 2014-12-29 DIAGNOSIS — D509 Iron deficiency anemia, unspecified: Secondary | ICD-10-CM | POA: Diagnosis not present

## 2014-12-29 DIAGNOSIS — N186 End stage renal disease: Secondary | ICD-10-CM | POA: Diagnosis not present

## 2015-01-01 DIAGNOSIS — N2581 Secondary hyperparathyroidism of renal origin: Secondary | ICD-10-CM | POA: Diagnosis not present

## 2015-01-01 DIAGNOSIS — D631 Anemia in chronic kidney disease: Secondary | ICD-10-CM | POA: Diagnosis not present

## 2015-01-01 DIAGNOSIS — D509 Iron deficiency anemia, unspecified: Secondary | ICD-10-CM | POA: Diagnosis not present

## 2015-01-01 DIAGNOSIS — N186 End stage renal disease: Secondary | ICD-10-CM | POA: Diagnosis not present

## 2015-01-03 DIAGNOSIS — I120 Hypertensive chronic kidney disease with stage 5 chronic kidney disease or end stage renal disease: Secondary | ICD-10-CM | POA: Diagnosis not present

## 2015-01-03 DIAGNOSIS — Z992 Dependence on renal dialysis: Secondary | ICD-10-CM | POA: Diagnosis not present

## 2015-01-03 DIAGNOSIS — D631 Anemia in chronic kidney disease: Secondary | ICD-10-CM | POA: Diagnosis not present

## 2015-01-03 DIAGNOSIS — N2581 Secondary hyperparathyroidism of renal origin: Secondary | ICD-10-CM | POA: Diagnosis not present

## 2015-01-03 DIAGNOSIS — N186 End stage renal disease: Secondary | ICD-10-CM | POA: Diagnosis not present

## 2015-01-03 DIAGNOSIS — D509 Iron deficiency anemia, unspecified: Secondary | ICD-10-CM | POA: Diagnosis not present

## 2015-01-05 DIAGNOSIS — D631 Anemia in chronic kidney disease: Secondary | ICD-10-CM | POA: Diagnosis not present

## 2015-01-05 DIAGNOSIS — N2581 Secondary hyperparathyroidism of renal origin: Secondary | ICD-10-CM | POA: Diagnosis not present

## 2015-01-05 DIAGNOSIS — Z23 Encounter for immunization: Secondary | ICD-10-CM | POA: Diagnosis not present

## 2015-01-05 DIAGNOSIS — N186 End stage renal disease: Secondary | ICD-10-CM | POA: Diagnosis not present

## 2015-01-05 DIAGNOSIS — D509 Iron deficiency anemia, unspecified: Secondary | ICD-10-CM | POA: Diagnosis not present

## 2015-01-08 DIAGNOSIS — D509 Iron deficiency anemia, unspecified: Secondary | ICD-10-CM | POA: Diagnosis not present

## 2015-01-08 DIAGNOSIS — D631 Anemia in chronic kidney disease: Secondary | ICD-10-CM | POA: Diagnosis not present

## 2015-01-08 DIAGNOSIS — N186 End stage renal disease: Secondary | ICD-10-CM | POA: Diagnosis not present

## 2015-01-08 DIAGNOSIS — Z23 Encounter for immunization: Secondary | ICD-10-CM | POA: Diagnosis not present

## 2015-01-08 DIAGNOSIS — N2581 Secondary hyperparathyroidism of renal origin: Secondary | ICD-10-CM | POA: Diagnosis not present

## 2015-01-09 DIAGNOSIS — Z79891 Long term (current) use of opiate analgesic: Secondary | ICD-10-CM | POA: Diagnosis not present

## 2015-01-09 DIAGNOSIS — M5136 Other intervertebral disc degeneration, lumbar region: Secondary | ICD-10-CM | POA: Diagnosis not present

## 2015-01-09 DIAGNOSIS — G894 Chronic pain syndrome: Secondary | ICD-10-CM | POA: Diagnosis not present

## 2015-01-10 DIAGNOSIS — D509 Iron deficiency anemia, unspecified: Secondary | ICD-10-CM | POA: Diagnosis not present

## 2015-01-10 DIAGNOSIS — D631 Anemia in chronic kidney disease: Secondary | ICD-10-CM | POA: Diagnosis not present

## 2015-01-10 DIAGNOSIS — N2581 Secondary hyperparathyroidism of renal origin: Secondary | ICD-10-CM | POA: Diagnosis not present

## 2015-01-10 DIAGNOSIS — N186 End stage renal disease: Secondary | ICD-10-CM | POA: Diagnosis not present

## 2015-01-10 DIAGNOSIS — Z23 Encounter for immunization: Secondary | ICD-10-CM | POA: Diagnosis not present

## 2015-01-12 DIAGNOSIS — N2581 Secondary hyperparathyroidism of renal origin: Secondary | ICD-10-CM | POA: Diagnosis not present

## 2015-01-12 DIAGNOSIS — D509 Iron deficiency anemia, unspecified: Secondary | ICD-10-CM | POA: Diagnosis not present

## 2015-01-12 DIAGNOSIS — Z23 Encounter for immunization: Secondary | ICD-10-CM | POA: Diagnosis not present

## 2015-01-12 DIAGNOSIS — D631 Anemia in chronic kidney disease: Secondary | ICD-10-CM | POA: Diagnosis not present

## 2015-01-12 DIAGNOSIS — N186 End stage renal disease: Secondary | ICD-10-CM | POA: Diagnosis not present

## 2015-01-15 DIAGNOSIS — D509 Iron deficiency anemia, unspecified: Secondary | ICD-10-CM | POA: Diagnosis not present

## 2015-01-15 DIAGNOSIS — D631 Anemia in chronic kidney disease: Secondary | ICD-10-CM | POA: Diagnosis not present

## 2015-01-15 DIAGNOSIS — Z23 Encounter for immunization: Secondary | ICD-10-CM | POA: Diagnosis not present

## 2015-01-15 DIAGNOSIS — N2581 Secondary hyperparathyroidism of renal origin: Secondary | ICD-10-CM | POA: Diagnosis not present

## 2015-01-15 DIAGNOSIS — N186 End stage renal disease: Secondary | ICD-10-CM | POA: Diagnosis not present

## 2015-01-17 DIAGNOSIS — D509 Iron deficiency anemia, unspecified: Secondary | ICD-10-CM | POA: Diagnosis not present

## 2015-01-17 DIAGNOSIS — N2581 Secondary hyperparathyroidism of renal origin: Secondary | ICD-10-CM | POA: Diagnosis not present

## 2015-01-17 DIAGNOSIS — Z23 Encounter for immunization: Secondary | ICD-10-CM | POA: Diagnosis not present

## 2015-01-17 DIAGNOSIS — N186 End stage renal disease: Secondary | ICD-10-CM | POA: Diagnosis not present

## 2015-01-17 DIAGNOSIS — D631 Anemia in chronic kidney disease: Secondary | ICD-10-CM | POA: Diagnosis not present

## 2015-01-19 DIAGNOSIS — D631 Anemia in chronic kidney disease: Secondary | ICD-10-CM | POA: Diagnosis not present

## 2015-01-19 DIAGNOSIS — N186 End stage renal disease: Secondary | ICD-10-CM | POA: Diagnosis not present

## 2015-01-19 DIAGNOSIS — D509 Iron deficiency anemia, unspecified: Secondary | ICD-10-CM | POA: Diagnosis not present

## 2015-01-19 DIAGNOSIS — Z23 Encounter for immunization: Secondary | ICD-10-CM | POA: Diagnosis not present

## 2015-01-19 DIAGNOSIS — N2581 Secondary hyperparathyroidism of renal origin: Secondary | ICD-10-CM | POA: Diagnosis not present

## 2015-01-22 DIAGNOSIS — D509 Iron deficiency anemia, unspecified: Secondary | ICD-10-CM | POA: Diagnosis not present

## 2015-01-22 DIAGNOSIS — N186 End stage renal disease: Secondary | ICD-10-CM | POA: Diagnosis not present

## 2015-01-22 DIAGNOSIS — Z23 Encounter for immunization: Secondary | ICD-10-CM | POA: Diagnosis not present

## 2015-01-22 DIAGNOSIS — N2581 Secondary hyperparathyroidism of renal origin: Secondary | ICD-10-CM | POA: Diagnosis not present

## 2015-01-22 DIAGNOSIS — D631 Anemia in chronic kidney disease: Secondary | ICD-10-CM | POA: Diagnosis not present

## 2015-01-24 DIAGNOSIS — N2581 Secondary hyperparathyroidism of renal origin: Secondary | ICD-10-CM | POA: Diagnosis not present

## 2015-01-24 DIAGNOSIS — N186 End stage renal disease: Secondary | ICD-10-CM | POA: Diagnosis not present

## 2015-01-24 DIAGNOSIS — Z23 Encounter for immunization: Secondary | ICD-10-CM | POA: Diagnosis not present

## 2015-01-24 DIAGNOSIS — D631 Anemia in chronic kidney disease: Secondary | ICD-10-CM | POA: Diagnosis not present

## 2015-01-24 DIAGNOSIS — D509 Iron deficiency anemia, unspecified: Secondary | ICD-10-CM | POA: Diagnosis not present

## 2015-01-26 DIAGNOSIS — D509 Iron deficiency anemia, unspecified: Secondary | ICD-10-CM | POA: Diagnosis not present

## 2015-01-26 DIAGNOSIS — D631 Anemia in chronic kidney disease: Secondary | ICD-10-CM | POA: Diagnosis not present

## 2015-01-26 DIAGNOSIS — N2581 Secondary hyperparathyroidism of renal origin: Secondary | ICD-10-CM | POA: Diagnosis not present

## 2015-01-26 DIAGNOSIS — N186 End stage renal disease: Secondary | ICD-10-CM | POA: Diagnosis not present

## 2015-01-26 DIAGNOSIS — Z23 Encounter for immunization: Secondary | ICD-10-CM | POA: Diagnosis not present

## 2015-01-29 DIAGNOSIS — N186 End stage renal disease: Secondary | ICD-10-CM | POA: Diagnosis not present

## 2015-01-29 DIAGNOSIS — N2581 Secondary hyperparathyroidism of renal origin: Secondary | ICD-10-CM | POA: Diagnosis not present

## 2015-01-29 DIAGNOSIS — D631 Anemia in chronic kidney disease: Secondary | ICD-10-CM | POA: Diagnosis not present

## 2015-01-29 DIAGNOSIS — D509 Iron deficiency anemia, unspecified: Secondary | ICD-10-CM | POA: Diagnosis not present

## 2015-01-29 DIAGNOSIS — Z23 Encounter for immunization: Secondary | ICD-10-CM | POA: Diagnosis not present

## 2015-01-31 DIAGNOSIS — D631 Anemia in chronic kidney disease: Secondary | ICD-10-CM | POA: Diagnosis not present

## 2015-01-31 DIAGNOSIS — N2581 Secondary hyperparathyroidism of renal origin: Secondary | ICD-10-CM | POA: Diagnosis not present

## 2015-01-31 DIAGNOSIS — N186 End stage renal disease: Secondary | ICD-10-CM | POA: Diagnosis not present

## 2015-01-31 DIAGNOSIS — Z23 Encounter for immunization: Secondary | ICD-10-CM | POA: Diagnosis not present

## 2015-01-31 DIAGNOSIS — D509 Iron deficiency anemia, unspecified: Secondary | ICD-10-CM | POA: Diagnosis not present

## 2015-02-02 DIAGNOSIS — Z23 Encounter for immunization: Secondary | ICD-10-CM | POA: Diagnosis not present

## 2015-02-02 DIAGNOSIS — D509 Iron deficiency anemia, unspecified: Secondary | ICD-10-CM | POA: Diagnosis not present

## 2015-02-02 DIAGNOSIS — N186 End stage renal disease: Secondary | ICD-10-CM | POA: Diagnosis not present

## 2015-02-02 DIAGNOSIS — D631 Anemia in chronic kidney disease: Secondary | ICD-10-CM | POA: Diagnosis not present

## 2015-02-02 DIAGNOSIS — N2581 Secondary hyperparathyroidism of renal origin: Secondary | ICD-10-CM | POA: Diagnosis not present

## 2015-02-02 DIAGNOSIS — I120 Hypertensive chronic kidney disease with stage 5 chronic kidney disease or end stage renal disease: Secondary | ICD-10-CM | POA: Diagnosis not present

## 2015-02-02 DIAGNOSIS — Z992 Dependence on renal dialysis: Secondary | ICD-10-CM | POA: Diagnosis not present

## 2015-02-05 DIAGNOSIS — D509 Iron deficiency anemia, unspecified: Secondary | ICD-10-CM | POA: Diagnosis not present

## 2015-02-05 DIAGNOSIS — N2581 Secondary hyperparathyroidism of renal origin: Secondary | ICD-10-CM | POA: Diagnosis not present

## 2015-02-05 DIAGNOSIS — N186 End stage renal disease: Secondary | ICD-10-CM | POA: Diagnosis not present

## 2015-02-05 DIAGNOSIS — D631 Anemia in chronic kidney disease: Secondary | ICD-10-CM | POA: Diagnosis not present

## 2015-02-07 DIAGNOSIS — N2581 Secondary hyperparathyroidism of renal origin: Secondary | ICD-10-CM | POA: Diagnosis not present

## 2015-02-07 DIAGNOSIS — D631 Anemia in chronic kidney disease: Secondary | ICD-10-CM | POA: Diagnosis not present

## 2015-02-07 DIAGNOSIS — N186 End stage renal disease: Secondary | ICD-10-CM | POA: Diagnosis not present

## 2015-02-07 DIAGNOSIS — D509 Iron deficiency anemia, unspecified: Secondary | ICD-10-CM | POA: Diagnosis not present

## 2015-02-09 DIAGNOSIS — N2581 Secondary hyperparathyroidism of renal origin: Secondary | ICD-10-CM | POA: Diagnosis not present

## 2015-02-09 DIAGNOSIS — D631 Anemia in chronic kidney disease: Secondary | ICD-10-CM | POA: Diagnosis not present

## 2015-02-09 DIAGNOSIS — D509 Iron deficiency anemia, unspecified: Secondary | ICD-10-CM | POA: Diagnosis not present

## 2015-02-09 DIAGNOSIS — N186 End stage renal disease: Secondary | ICD-10-CM | POA: Diagnosis not present

## 2015-02-12 DIAGNOSIS — N186 End stage renal disease: Secondary | ICD-10-CM | POA: Diagnosis not present

## 2015-02-12 DIAGNOSIS — D509 Iron deficiency anemia, unspecified: Secondary | ICD-10-CM | POA: Diagnosis not present

## 2015-02-12 DIAGNOSIS — D631 Anemia in chronic kidney disease: Secondary | ICD-10-CM | POA: Diagnosis not present

## 2015-02-12 DIAGNOSIS — N2581 Secondary hyperparathyroidism of renal origin: Secondary | ICD-10-CM | POA: Diagnosis not present

## 2015-02-14 DIAGNOSIS — N186 End stage renal disease: Secondary | ICD-10-CM | POA: Diagnosis not present

## 2015-02-14 DIAGNOSIS — D631 Anemia in chronic kidney disease: Secondary | ICD-10-CM | POA: Diagnosis not present

## 2015-02-14 DIAGNOSIS — N2581 Secondary hyperparathyroidism of renal origin: Secondary | ICD-10-CM | POA: Diagnosis not present

## 2015-02-14 DIAGNOSIS — D509 Iron deficiency anemia, unspecified: Secondary | ICD-10-CM | POA: Diagnosis not present

## 2015-02-15 ENCOUNTER — Other Ambulatory Visit: Payer: Medicare Other

## 2015-02-16 DIAGNOSIS — D631 Anemia in chronic kidney disease: Secondary | ICD-10-CM | POA: Diagnosis not present

## 2015-02-16 DIAGNOSIS — D509 Iron deficiency anemia, unspecified: Secondary | ICD-10-CM | POA: Diagnosis not present

## 2015-02-16 DIAGNOSIS — N186 End stage renal disease: Secondary | ICD-10-CM | POA: Diagnosis not present

## 2015-02-16 DIAGNOSIS — N2581 Secondary hyperparathyroidism of renal origin: Secondary | ICD-10-CM | POA: Diagnosis not present

## 2015-02-19 DIAGNOSIS — D509 Iron deficiency anemia, unspecified: Secondary | ICD-10-CM | POA: Diagnosis not present

## 2015-02-19 DIAGNOSIS — D631 Anemia in chronic kidney disease: Secondary | ICD-10-CM | POA: Diagnosis not present

## 2015-02-19 DIAGNOSIS — N2581 Secondary hyperparathyroidism of renal origin: Secondary | ICD-10-CM | POA: Diagnosis not present

## 2015-02-19 DIAGNOSIS — N186 End stage renal disease: Secondary | ICD-10-CM | POA: Diagnosis not present

## 2015-02-20 ENCOUNTER — Telehealth: Payer: Self-pay | Admitting: Cardiology

## 2015-02-20 NOTE — Telephone Encounter (Signed)
Received records from [facility] for appointment on 03/08/15 with Kerin Ransom, [place].  Records given to [employer] (medical records) for Luke's schedule on 03/08/15. lp

## 2015-02-21 DIAGNOSIS — N186 End stage renal disease: Secondary | ICD-10-CM | POA: Diagnosis not present

## 2015-02-21 DIAGNOSIS — D509 Iron deficiency anemia, unspecified: Secondary | ICD-10-CM | POA: Diagnosis not present

## 2015-02-21 DIAGNOSIS — N2581 Secondary hyperparathyroidism of renal origin: Secondary | ICD-10-CM | POA: Diagnosis not present

## 2015-02-21 DIAGNOSIS — D631 Anemia in chronic kidney disease: Secondary | ICD-10-CM | POA: Diagnosis not present

## 2015-02-22 ENCOUNTER — Ambulatory Visit: Payer: Medicare Other | Admitting: Vascular Surgery

## 2015-02-23 DIAGNOSIS — N186 End stage renal disease: Secondary | ICD-10-CM | POA: Diagnosis not present

## 2015-02-23 DIAGNOSIS — D631 Anemia in chronic kidney disease: Secondary | ICD-10-CM | POA: Diagnosis not present

## 2015-02-23 DIAGNOSIS — N2581 Secondary hyperparathyroidism of renal origin: Secondary | ICD-10-CM | POA: Diagnosis not present

## 2015-02-23 DIAGNOSIS — D509 Iron deficiency anemia, unspecified: Secondary | ICD-10-CM | POA: Diagnosis not present

## 2015-02-27 DIAGNOSIS — N2581 Secondary hyperparathyroidism of renal origin: Secondary | ICD-10-CM | POA: Diagnosis not present

## 2015-02-27 DIAGNOSIS — N186 End stage renal disease: Secondary | ICD-10-CM | POA: Diagnosis not present

## 2015-02-27 DIAGNOSIS — D509 Iron deficiency anemia, unspecified: Secondary | ICD-10-CM | POA: Diagnosis not present

## 2015-02-27 DIAGNOSIS — D631 Anemia in chronic kidney disease: Secondary | ICD-10-CM | POA: Diagnosis not present

## 2015-02-28 DIAGNOSIS — D509 Iron deficiency anemia, unspecified: Secondary | ICD-10-CM | POA: Diagnosis not present

## 2015-02-28 DIAGNOSIS — N186 End stage renal disease: Secondary | ICD-10-CM | POA: Diagnosis not present

## 2015-02-28 DIAGNOSIS — N2581 Secondary hyperparathyroidism of renal origin: Secondary | ICD-10-CM | POA: Diagnosis not present

## 2015-02-28 DIAGNOSIS — D631 Anemia in chronic kidney disease: Secondary | ICD-10-CM | POA: Diagnosis not present

## 2015-03-02 DIAGNOSIS — N186 End stage renal disease: Secondary | ICD-10-CM | POA: Diagnosis not present

## 2015-03-02 DIAGNOSIS — N2581 Secondary hyperparathyroidism of renal origin: Secondary | ICD-10-CM | POA: Diagnosis not present

## 2015-03-02 DIAGNOSIS — D631 Anemia in chronic kidney disease: Secondary | ICD-10-CM | POA: Diagnosis not present

## 2015-03-02 DIAGNOSIS — D509 Iron deficiency anemia, unspecified: Secondary | ICD-10-CM | POA: Diagnosis not present

## 2015-03-05 DIAGNOSIS — D631 Anemia in chronic kidney disease: Secondary | ICD-10-CM | POA: Diagnosis not present

## 2015-03-05 DIAGNOSIS — Z992 Dependence on renal dialysis: Secondary | ICD-10-CM | POA: Diagnosis not present

## 2015-03-05 DIAGNOSIS — N2581 Secondary hyperparathyroidism of renal origin: Secondary | ICD-10-CM | POA: Diagnosis not present

## 2015-03-05 DIAGNOSIS — I120 Hypertensive chronic kidney disease with stage 5 chronic kidney disease or end stage renal disease: Secondary | ICD-10-CM | POA: Diagnosis not present

## 2015-03-05 DIAGNOSIS — N186 End stage renal disease: Secondary | ICD-10-CM | POA: Diagnosis not present

## 2015-03-05 DIAGNOSIS — D509 Iron deficiency anemia, unspecified: Secondary | ICD-10-CM | POA: Diagnosis not present

## 2015-03-07 DIAGNOSIS — N2581 Secondary hyperparathyroidism of renal origin: Secondary | ICD-10-CM | POA: Diagnosis not present

## 2015-03-07 DIAGNOSIS — N186 End stage renal disease: Secondary | ICD-10-CM | POA: Diagnosis not present

## 2015-03-07 DIAGNOSIS — D631 Anemia in chronic kidney disease: Secondary | ICD-10-CM | POA: Diagnosis not present

## 2015-03-08 ENCOUNTER — Encounter: Payer: Self-pay | Admitting: Cardiology

## 2015-03-08 ENCOUNTER — Ambulatory Visit (INDEPENDENT_AMBULATORY_CARE_PROVIDER_SITE_OTHER): Payer: Medicare Other | Admitting: Cardiology

## 2015-03-08 VITALS — BP 112/82 | HR 93 | Ht 74.0 in | Wt 178.2 lb

## 2015-03-08 DIAGNOSIS — I428 Other cardiomyopathies: Secondary | ICD-10-CM

## 2015-03-08 DIAGNOSIS — Z992 Dependence on renal dialysis: Secondary | ICD-10-CM

## 2015-03-08 DIAGNOSIS — I10 Essential (primary) hypertension: Secondary | ICD-10-CM | POA: Diagnosis not present

## 2015-03-08 DIAGNOSIS — I429 Cardiomyopathy, unspecified: Secondary | ICD-10-CM | POA: Diagnosis not present

## 2015-03-08 DIAGNOSIS — N186 End stage renal disease: Secondary | ICD-10-CM | POA: Diagnosis not present

## 2015-03-08 DIAGNOSIS — I723 Aneurysm of iliac artery: Secondary | ICD-10-CM

## 2015-03-08 NOTE — Assessment & Plan Note (Signed)
S/P surgical repair 11/08/14 after pre op cardiac clearance with echo and Myoview

## 2015-03-08 NOTE — Assessment & Plan Note (Signed)
Controlled.  

## 2015-03-08 NOTE — Progress Notes (Signed)
03/08/2015 Gregory [name]   [birth year]  LE:[id number]  Primary Physician Gregory Chandler, NP Primary Cardiologist: Dr Stanford Breed  HPI:  64 y/o AA male with a history of HTN, ESRD, PVD, and prior cardiomyopathy (30-35%)by echo in Aug 2014. We saw him prior to bilateral iliac aneurysm repair for pre op clearance. A repeat echo showed his EF to now be 60-65% Nov 2015. A Myoview was low risk in Dec 2015. He has no heart failure symptoms. He denies any chest pain. His B/P is controlled now that he is on dialysis (2 yrs).    Current Outpatient Prescriptions  Medication Sig Dispense Refill  . colchicine 0.6 MG tablet Take 1 tablet by mouth daily as needed.  0  . Ferric Citrate (AURYXIA) 210 MG TABS Take 1 capsule by mouth 3 (three) times daily with meals.    . fludrocortisone (FLORINEF) 0.1 MG tablet Take 0.1 mg by mouth 3 (three) times a week.    Marland Kitchen HYDROmorphone (DILAUDID) 2 MG tablet Take 1 tablet by mouth every 6 (six) hours as needed.  0  . midodrine (PROAMATINE) 5 MG tablet Take 1 tablet by mouth 2 (two) times daily.    Marland Kitchen omeprazole (PRILOSEC) 40 MG capsule Take 40 mg by mouth daily.    Marland Kitchen oxyCODONE (ROXICODONE) 5 MG immediate release tablet Take 1 tablet (5 mg total) by mouth every 6 (six) hours as needed. 20 tablet 0  . predniSONE (DELTASONE) 5 MG tablet Take 1 tablet by mouth daily as needed.  0  . promethazine (PHENERGAN) 25 MG tablet Take 25 mg by mouth every 6 (six) hours as needed for nausea or vomiting.    . SENSIPAR 30 MG tablet Take 30 mg by mouth daily.     No current facility-administered medications for this visit.   Facility-Administered Medications Ordered in Other Visits  Medication Dose Route Frequency Provider Last Rate Last Dose  . vancomycin (VANCOCIN) IVPB 1000 mg/200 mL premix  1,000 mg Intravenous 60 min Pre-Op Elam Dutch, MD        No Known Allergies  Social History   Social History  . Marital Status: Married    Spouse Name: N/A  . Number of Children:  2  . Years of Education: N/A   Occupational History  . Retired    Social History Main Topics  . Smoking status: Never Smoker   . Smokeless tobacco: Never Used  . Alcohol Use: No  . Drug Use: No  . Sexual Activity: Not Currently   Other Topics Concern  . Not on file   Social History Narrative     Review of Systems: General: negative for chills, fever, night sweats or weight changes.  Cardiovascular: negative for chest pain, dyspnea on exertion, edema, orthopnea, palpitations, paroxysmal nocturnal dyspnea or shortness of breath Dermatological: negative for rash Respiratory: negative for cough or wheezing Urologic: negative for hematuria Abdominal: negative for nausea, vomiting, diarrhea, bright red blood per rectum, melena, or hematemesis Neurologic: negative for visual changes, syncope, or dizziness All other systems reviewed and are otherwise negative except as noted above.    Blood pressure 112/82, pulse 93, height 6\' 2"  (1.88 m), weight 178 lb 3.2 oz (80.831 kg).  General appearance: alert, cooperative and no distress Neck: no carotid bruit and no JVD Lungs: clear to auscultation bilaterally Extremities: RUE AVF, no LE edema Skin: Skin color, texture, turgor normal. No rashes or lesions Neurologic: Grossly normal  EKG NSR  ASSESSMENT AND PLAN:   NICM (  nonischemic cardiomyopathy) ([place]) Noted 8/10, Echocardiogram with EF A999333, mixed systolic and diastolic dysfunction Repeat echo (after starting dialysis) 03/16/14- EF 60-65% with grade 1 DD Myoview 04/06/14- negative for ischemia  Essential hypertension, benign Controlled  Aneurysm of iliac artery (HCC) S/P surgical repair 11/08/14 after pre op cardiac clearance with echo and Myoview  ESRD on dialysis [facility] Followed by Dr Marval Regal. HD MWF at [facility]  [name] cardiomyopathy may have been related to HTN. It has resolved after starting HD. His Myoview was negative. He has no heart failure on exam. We can  see again as needed.   Kerin Ransom K PA-C 03/08/2015 8:49 AM

## 2015-03-08 NOTE — Assessment & Plan Note (Signed)
Noted 8/10, Echocardiogram with EF A999333, mixed systolic and diastolic dysfunction Repeat echo (after starting dialysis) 03/16/14- EF 60-65% with grade 1 DD Myoview 04/06/14- negative for ischemia

## 2015-03-08 NOTE — Patient Instructions (Signed)
Your physician recommends that you schedule a follow-up appointment in: AS NEEDED  

## 2015-03-08 NOTE — Assessment & Plan Note (Signed)
Followed by Dr Marval Regal. HD MWF at [place]

## 2015-03-09 DIAGNOSIS — N186 End stage renal disease: Secondary | ICD-10-CM | POA: Diagnosis not present

## 2015-03-09 DIAGNOSIS — D631 Anemia in chronic kidney disease: Secondary | ICD-10-CM | POA: Diagnosis not present

## 2015-03-09 DIAGNOSIS — N2581 Secondary hyperparathyroidism of renal origin: Secondary | ICD-10-CM | POA: Diagnosis not present

## 2015-03-12 DIAGNOSIS — N186 End stage renal disease: Secondary | ICD-10-CM | POA: Diagnosis not present

## 2015-03-12 DIAGNOSIS — D631 Anemia in chronic kidney disease: Secondary | ICD-10-CM | POA: Diagnosis not present

## 2015-03-12 DIAGNOSIS — N2581 Secondary hyperparathyroidism of renal origin: Secondary | ICD-10-CM | POA: Diagnosis not present

## 2015-03-14 DIAGNOSIS — D631 Anemia in chronic kidney disease: Secondary | ICD-10-CM | POA: Diagnosis not present

## 2015-03-14 DIAGNOSIS — N2581 Secondary hyperparathyroidism of renal origin: Secondary | ICD-10-CM | POA: Diagnosis not present

## 2015-03-14 DIAGNOSIS — N186 End stage renal disease: Secondary | ICD-10-CM | POA: Diagnosis not present

## 2015-03-16 DIAGNOSIS — N2581 Secondary hyperparathyroidism of renal origin: Secondary | ICD-10-CM | POA: Diagnosis not present

## 2015-03-16 DIAGNOSIS — N186 End stage renal disease: Secondary | ICD-10-CM | POA: Diagnosis not present

## 2015-03-16 DIAGNOSIS — D631 Anemia in chronic kidney disease: Secondary | ICD-10-CM | POA: Diagnosis not present

## 2015-03-19 DIAGNOSIS — D631 Anemia in chronic kidney disease: Secondary | ICD-10-CM | POA: Diagnosis not present

## 2015-03-19 DIAGNOSIS — N186 End stage renal disease: Secondary | ICD-10-CM | POA: Diagnosis not present

## 2015-03-19 DIAGNOSIS — N2581 Secondary hyperparathyroidism of renal origin: Secondary | ICD-10-CM | POA: Diagnosis not present

## 2015-03-21 DIAGNOSIS — N186 End stage renal disease: Secondary | ICD-10-CM | POA: Diagnosis not present

## 2015-03-21 DIAGNOSIS — N2581 Secondary hyperparathyroidism of renal origin: Secondary | ICD-10-CM | POA: Diagnosis not present

## 2015-03-21 DIAGNOSIS — D631 Anemia in chronic kidney disease: Secondary | ICD-10-CM | POA: Diagnosis not present

## 2015-03-23 DIAGNOSIS — N186 End stage renal disease: Secondary | ICD-10-CM | POA: Diagnosis not present

## 2015-03-23 DIAGNOSIS — N2581 Secondary hyperparathyroidism of renal origin: Secondary | ICD-10-CM | POA: Diagnosis not present

## 2015-03-23 DIAGNOSIS — D631 Anemia in chronic kidney disease: Secondary | ICD-10-CM | POA: Diagnosis not present

## 2015-03-26 DIAGNOSIS — D631 Anemia in chronic kidney disease: Secondary | ICD-10-CM | POA: Diagnosis not present

## 2015-03-26 DIAGNOSIS — N186 End stage renal disease: Secondary | ICD-10-CM | POA: Diagnosis not present

## 2015-03-26 DIAGNOSIS — N2581 Secondary hyperparathyroidism of renal origin: Secondary | ICD-10-CM | POA: Diagnosis not present

## 2015-03-27 DIAGNOSIS — I871 Compression of vein: Secondary | ICD-10-CM | POA: Diagnosis not present

## 2015-03-27 DIAGNOSIS — N186 End stage renal disease: Secondary | ICD-10-CM | POA: Diagnosis not present

## 2015-03-27 DIAGNOSIS — Z992 Dependence on renal dialysis: Secondary | ICD-10-CM | POA: Diagnosis not present

## 2015-03-27 DIAGNOSIS — T82858D Stenosis of vascular prosthetic devices, implants and grafts, subsequent encounter: Secondary | ICD-10-CM | POA: Diagnosis not present

## 2015-03-28 DIAGNOSIS — N186 End stage renal disease: Secondary | ICD-10-CM | POA: Diagnosis not present

## 2015-03-28 DIAGNOSIS — N2581 Secondary hyperparathyroidism of renal origin: Secondary | ICD-10-CM | POA: Diagnosis not present

## 2015-03-28 DIAGNOSIS — D631 Anemia in chronic kidney disease: Secondary | ICD-10-CM | POA: Diagnosis not present

## 2015-03-31 DIAGNOSIS — N2581 Secondary hyperparathyroidism of renal origin: Secondary | ICD-10-CM | POA: Diagnosis not present

## 2015-03-31 DIAGNOSIS — D631 Anemia in chronic kidney disease: Secondary | ICD-10-CM | POA: Diagnosis not present

## 2015-03-31 DIAGNOSIS — N186 End stage renal disease: Secondary | ICD-10-CM | POA: Diagnosis not present

## 2015-04-02 DIAGNOSIS — N2581 Secondary hyperparathyroidism of renal origin: Secondary | ICD-10-CM | POA: Diagnosis not present

## 2015-04-02 DIAGNOSIS — D631 Anemia in chronic kidney disease: Secondary | ICD-10-CM | POA: Diagnosis not present

## 2015-04-02 DIAGNOSIS — N186 End stage renal disease: Secondary | ICD-10-CM | POA: Diagnosis not present

## 2015-04-04 DIAGNOSIS — N186 End stage renal disease: Secondary | ICD-10-CM | POA: Diagnosis not present

## 2015-04-04 DIAGNOSIS — N2581 Secondary hyperparathyroidism of renal origin: Secondary | ICD-10-CM | POA: Diagnosis not present

## 2015-04-04 DIAGNOSIS — I120 Hypertensive chronic kidney disease with stage 5 chronic kidney disease or end stage renal disease: Secondary | ICD-10-CM | POA: Diagnosis not present

## 2015-04-04 DIAGNOSIS — D631 Anemia in chronic kidney disease: Secondary | ICD-10-CM | POA: Diagnosis not present

## 2015-04-04 DIAGNOSIS — Z992 Dependence on renal dialysis: Secondary | ICD-10-CM | POA: Diagnosis not present

## 2015-04-06 ENCOUNTER — Encounter: Payer: Self-pay | Admitting: Vascular Surgery

## 2015-04-06 DIAGNOSIS — N2581 Secondary hyperparathyroidism of renal origin: Secondary | ICD-10-CM | POA: Diagnosis not present

## 2015-04-06 DIAGNOSIS — D631 Anemia in chronic kidney disease: Secondary | ICD-10-CM | POA: Diagnosis not present

## 2015-04-06 DIAGNOSIS — N186 End stage renal disease: Secondary | ICD-10-CM | POA: Diagnosis not present

## 2015-04-09 DIAGNOSIS — N186 End stage renal disease: Secondary | ICD-10-CM | POA: Diagnosis not present

## 2015-04-09 DIAGNOSIS — N2581 Secondary hyperparathyroidism of renal origin: Secondary | ICD-10-CM | POA: Diagnosis not present

## 2015-04-09 DIAGNOSIS — D631 Anemia in chronic kidney disease: Secondary | ICD-10-CM | POA: Diagnosis not present

## 2015-04-10 DIAGNOSIS — M5136 Other intervertebral disc degeneration, lumbar region: Secondary | ICD-10-CM | POA: Diagnosis not present

## 2015-04-10 DIAGNOSIS — G894 Chronic pain syndrome: Secondary | ICD-10-CM | POA: Diagnosis not present

## 2015-04-10 DIAGNOSIS — Z79891 Long term (current) use of opiate analgesic: Secondary | ICD-10-CM | POA: Diagnosis not present

## 2015-04-11 ENCOUNTER — Ambulatory Visit: Payer: Medicare Other | Admitting: Vascular Surgery

## 2015-04-11 DIAGNOSIS — D631 Anemia in chronic kidney disease: Secondary | ICD-10-CM | POA: Diagnosis not present

## 2015-04-11 DIAGNOSIS — N186 End stage renal disease: Secondary | ICD-10-CM | POA: Diagnosis not present

## 2015-04-11 DIAGNOSIS — N2581 Secondary hyperparathyroidism of renal origin: Secondary | ICD-10-CM | POA: Diagnosis not present

## 2015-04-12 ENCOUNTER — Other Ambulatory Visit: Payer: Self-pay

## 2015-04-12 ENCOUNTER — Ambulatory Visit (INDEPENDENT_AMBULATORY_CARE_PROVIDER_SITE_OTHER): Payer: Medicare Other | Admitting: Vascular Surgery

## 2015-04-12 ENCOUNTER — Encounter: Payer: Self-pay | Admitting: Vascular Surgery

## 2015-04-12 VITALS — BP 102/69 | HR 79 | Temp 98.5°F | Resp 16 | Ht 72.0 in | Wt 179.0 lb

## 2015-04-12 DIAGNOSIS — T82590A Other mechanical complication of surgically created arteriovenous fistula, initial encounter: Secondary | ICD-10-CM

## 2015-04-12 DIAGNOSIS — Z01812 Encounter for preprocedural laboratory examination: Secondary | ICD-10-CM | POA: Diagnosis not present

## 2015-04-12 NOTE — Progress Notes (Signed)
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 He previously had plication of the central portion of the fistula in 2014. The area of concern is more proximal to this prior plication site. Patient also recently had angioplasty of the outflow by Dr. Augustin Coupe.  He denies any problems with cannulating the fistula or with access flow. Other chronic medical problems include hypertension, congestive failure, chronic back pain, iliac aneurysms previously treated with aneurysm stent graft repair. These have all been stable. He also has an 17 mm. Mesenteric artery aneurysm which is being followed.  Past Medical History  Diagnosis Date  . Hypertension   . CHF (congestive heart failure) ([place])   . Chronic back pain     "mostly lower" (11/08/2014)  . Heart failure with reduced ejection fraction ([place])   . Gout     "hands" (11/08/2014)  . GERD (gastroesophageal reflux disease)     "sometimes" (11/08/2014)  . Arthritis     "hands; basically all my joints" (11/08/2014)  . End stage renal disease ([place])     pt does not urinate; pt. states that he does dialysis on MWF; [place]" (11/08/2014)  . Anemia in chronic kidney disease    Past Surgical History  Procedure Laterality Date  . Av fistula placement Right 07/26/2009  . Ligation of arteriovenous  fistula Right 123456    Procedure: PLICATION OF ARTERIOVENOUS  FISTULA;  Surgeon: Elam Dutch, MD;  Location: [facility];  Service: Vascular;  Laterality: Right;  . Insertion of dialysis catheter N/A 02/22/2013    Procedure: INSERTION OF DIALYSIS CATHETER;  Surgeon: Elam Dutch, MD;  Location: [facility] OR;  Service: Vascular;  Laterality: N/A;  . Embolization Left 11/08/2014    internal iliac artery; Gore Excluder bifurcated stent graft for repair of common iliac aneurysm  . Endovascular stent insertion Bilateral 11/08/2014    Procedure: REPAIR OF BILATERAL ILIAC ARTERY ANEURYSM WITH  Bifurcated stent,  with coiling left internal artery;  Surgeon: Elam Dutch, MD;  Location: [facility] OR;  Service: Vascular;  Laterality: Bilateral;   Current Outpatient Prescriptions on File Prior to Visit  Medication Sig Dispense Refill  . colchicine 0.6 MG tablet Take 1 tablet by mouth daily as needed.  0  . Ferric Citrate (AURYXIA) 210 MG TABS Take 1 capsule by mouth 3 (three) times daily with meals.    . fludrocortisone (FLORINEF) 0.1 MG tablet Take 0.1 mg by mouth 3 (three) times a week.    . midodrine (PROAMATINE) 5 MG tablet Take 1 tablet by mouth 2 (two) times daily.    Marland Kitchen omeprazole (PRILOSEC) 40 MG capsule Take 40 mg by mouth daily.    . predniSONE (DELTASONE) 5 MG tablet Take 1 tablet by mouth daily as needed.  0  . promethazine (PHENERGAN) 25 MG tablet Take 25 mg by mouth every 6 (six) hours as needed for nausea or vomiting.    . SENSIPAR 30 MG tablet Take 30 mg by mouth daily.    Marland Kitchen HYDROmorphone (DILAUDID) 2 MG tablet Take 1 tablet by mouth every 6 (six) hours as needed.  0  . oxyCODONE (ROXICODONE) 5 MG immediate release tablet Take 1 tablet (5 mg total) by mouth every 6 (six) hours as needed. (Patient not taking: Reported on 04/12/2015) 20 tablet 0   Current Facility-Administered Medications on File Prior to Visit  Medication Dose Route Frequency Provider Last Rate Last Dose  . vancomycin (VANCOCIN) IVPB 1000 mg/200 mL  premix  1,000 mg Intravenous 60 min Pre-Op Elam Dutch, MD       No Known Allergies  Review of systems: He denies shortness of breath. He denies chest pain.  Physical exam:  Filed Vitals:   04/12/15 1203  BP: 102/69  Pulse: 79  Temp: 98.5 F (36.9 C)  TempSrc: Oral  Resp: 16  Height: 6' (1.829 m)  Weight: 179 lb (81.194 kg)  SpO2: 100%    Extremities: Right arm aneurysmal degeneration of proximal portion of right upper arm AV fistula approximate 5 cm diameter. Skin is not really thinned out but significant redundancy of the fistula. There is a palpable thrill in  the fistula.  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  Plan: Plication of AV fistula on Tuesday, 04/17/2015. Risks benefits possible complications and procedure details were discussed patient today including not limited to bleeding infection fistula thrombosis. He wishes to proceed. We will schedule him for CT angiogram to review his stent graft at his 2 week follow-up appointment after this.  [name], MD [facility] [place] Office: [phone] Pager: [phone]

## 2015-04-13 DIAGNOSIS — N2581 Secondary hyperparathyroidism of renal origin: Secondary | ICD-10-CM | POA: Diagnosis not present

## 2015-04-13 DIAGNOSIS — D631 Anemia in chronic kidney disease: Secondary | ICD-10-CM | POA: Diagnosis not present

## 2015-04-13 DIAGNOSIS — N186 End stage renal disease: Secondary | ICD-10-CM | POA: Diagnosis not present

## 2015-04-16 DIAGNOSIS — D631 Anemia in chronic kidney disease: Secondary | ICD-10-CM | POA: Diagnosis not present

## 2015-04-16 DIAGNOSIS — N186 End stage renal disease: Secondary | ICD-10-CM | POA: Diagnosis not present

## 2015-04-16 DIAGNOSIS — N2581 Secondary hyperparathyroidism of renal origin: Secondary | ICD-10-CM | POA: Diagnosis not present

## 2015-04-18 DIAGNOSIS — N2581 Secondary hyperparathyroidism of renal origin: Secondary | ICD-10-CM | POA: Diagnosis not present

## 2015-04-18 DIAGNOSIS — D631 Anemia in chronic kidney disease: Secondary | ICD-10-CM | POA: Diagnosis not present

## 2015-04-18 DIAGNOSIS — N186 End stage renal disease: Secondary | ICD-10-CM | POA: Diagnosis not present

## 2015-04-20 DIAGNOSIS — N186 End stage renal disease: Secondary | ICD-10-CM | POA: Diagnosis not present

## 2015-04-20 DIAGNOSIS — N2581 Secondary hyperparathyroidism of renal origin: Secondary | ICD-10-CM | POA: Diagnosis not present

## 2015-04-20 DIAGNOSIS — D631 Anemia in chronic kidney disease: Secondary | ICD-10-CM | POA: Diagnosis not present

## 2015-04-23 ENCOUNTER — Encounter (HOSPITAL_COMMUNITY): Payer: Self-pay | Admitting: *Deleted

## 2015-04-23 DIAGNOSIS — N2581 Secondary hyperparathyroidism of renal origin: Secondary | ICD-10-CM | POA: Diagnosis not present

## 2015-04-23 DIAGNOSIS — N186 End stage renal disease: Secondary | ICD-10-CM | POA: Diagnosis not present

## 2015-04-23 DIAGNOSIS — D631 Anemia in chronic kidney disease: Secondary | ICD-10-CM | POA: Diagnosis not present

## 2015-04-23 MED ORDER — CHLORHEXIDINE GLUCONATE CLOTH 2 % EX PADS: 6.0000 | MEDICATED_PAD | Freq: Once | CUTANEOUS | Status: DC

## 2015-04-23 MED ORDER — DEXTROSE 5 % IV SOLN
1.5000 g | INTRAVENOUS | Status: AC
  Administered 2015-04-24: 1.5 g via INTRAVENOUS
  Filled 2015-04-23: qty 1.5

## 2015-04-23 MED ORDER — SODIUM CHLORIDE 0.9 % IV SOLN
INTRAVENOUS | Status: DC
  Administered 2015-04-24 (×2): via INTRAVENOUS

## 2015-04-24 ENCOUNTER — Encounter (HOSPITAL_COMMUNITY)
Admission: RE | Disposition: A | Discharge status: Home / Self Care | Payer: Self-pay | Source: Ambulatory Visit | Attending: Vascular Surgery

## 2015-04-24 ENCOUNTER — Ambulatory Visit (HOSPITAL_COMMUNITY)
Admission: RE | Admit: 2015-04-24 | Discharge: 2015-04-24 | Disposition: A | Discharge status: Home / Self Care | Payer: Medicare Other | Source: Ambulatory Visit | Attending: Vascular Surgery | Admitting: Vascular Surgery

## 2015-04-24 ENCOUNTER — Encounter (HOSPITAL_COMMUNITY): Payer: Self-pay | Admitting: *Deleted

## 2015-04-24 ENCOUNTER — Ambulatory Visit (HOSPITAL_COMMUNITY): Payer: Medicare Other | Admitting: Anesthesiology

## 2015-04-24 DIAGNOSIS — M549 Dorsalgia, unspecified: Secondary | ICD-10-CM | POA: Diagnosis not present

## 2015-04-24 DIAGNOSIS — I509 Heart failure, unspecified: Secondary | ICD-10-CM | POA: Diagnosis not present

## 2015-04-24 DIAGNOSIS — T82898A Other specified complication of vascular prosthetic devices, implants and grafts, initial encounter: Secondary | ICD-10-CM | POA: Diagnosis not present

## 2015-04-24 DIAGNOSIS — Z79899 Other long term (current) drug therapy: Secondary | ICD-10-CM | POA: Insufficient documentation

## 2015-04-24 DIAGNOSIS — Y832 Surgical operation with anastomosis, bypass or graft as the cause of abnormal reaction of the patient, or of later complication, without mention of misadventure at the time of the procedure: Secondary | ICD-10-CM | POA: Diagnosis not present

## 2015-04-24 DIAGNOSIS — I132 Hypertensive heart and chronic kidney disease with heart failure and with stage 5 chronic kidney disease, or end stage renal disease: Secondary | ICD-10-CM | POA: Insufficient documentation

## 2015-04-24 DIAGNOSIS — M109 Gout, unspecified: Secondary | ICD-10-CM | POA: Insufficient documentation

## 2015-04-24 DIAGNOSIS — I5022 Chronic systolic (congestive) heart failure: Secondary | ICD-10-CM | POA: Insufficient documentation

## 2015-04-24 DIAGNOSIS — N186 End stage renal disease: Secondary | ICD-10-CM | POA: Insufficient documentation

## 2015-04-24 DIAGNOSIS — M159 Polyosteoarthritis, unspecified: Secondary | ICD-10-CM | POA: Insufficient documentation

## 2015-04-24 DIAGNOSIS — D631 Anemia in chronic kidney disease: Secondary | ICD-10-CM | POA: Insufficient documentation

## 2015-04-24 DIAGNOSIS — K219 Gastro-esophageal reflux disease without esophagitis: Secondary | ICD-10-CM | POA: Diagnosis not present

## 2015-04-24 DIAGNOSIS — G8929 Other chronic pain: Secondary | ICD-10-CM | POA: Insufficient documentation

## 2015-04-24 DIAGNOSIS — Z992 Dependence on renal dialysis: Secondary | ICD-10-CM | POA: Insufficient documentation

## 2015-04-24 HISTORY — PX: REVISON OF ARTERIOVENOUS FISTULA: SHX6074

## 2015-04-24 LAB — POCT I-STAT 4, (NA,K, GLUC, HGB,HCT)
Glucose, Bld: 84 mg/dL (ref 65–99)
HEMATOCRIT: 45 % (ref 39.0–52.0)
Hemoglobin: 15.3 g/dL (ref 13.0–17.0)
Potassium: 5.7 mmol/L — ABNORMAL HIGH (ref 3.5–5.1)
SODIUM: 136 mmol/L (ref 135–145)

## 2015-04-24 SURGERY — REVISON OF ARTERIOVENOUS FISTULA
Anesthesia: Monitor Anesthesia Care | Site: Arm Upper | Laterality: Right

## 2015-04-24 MED ORDER — FENTANYL CITRATE (PF) 100 MCG/2ML IJ SOLN
25.0000 ug | INTRAMUSCULAR | Status: DC | PRN
  Administered 2015-04-24: 50 ug via INTRAVENOUS

## 2015-04-24 MED ORDER — DIPHENHYDRAMINE HCL 50 MG/ML IJ SOLN
INTRAMUSCULAR | Status: DC | PRN
  Administered 2015-04-24: 25 mg via INTRAVENOUS

## 2015-04-24 MED ORDER — LIDOCAINE HCL (CARDIAC) 20 MG/ML IV SOLN
INTRAVENOUS | Status: AC
  Filled 2015-04-24: qty 5

## 2015-04-24 MED ORDER — SODIUM CHLORIDE 0.9 % IV SOLN
INTRAVENOUS | Status: DC | PRN
  Administered 2015-04-24: 500 mL

## 2015-04-24 MED ORDER — OXYCODONE HCL 5 MG PO TABS
ORAL_TABLET | ORAL | Status: AC
  Filled 2015-04-24: qty 1

## 2015-04-24 MED ORDER — PROMETHAZINE HCL 25 MG/ML IJ SOLN: 6.2500 mg | INTRAMUSCULAR | Status: DC | PRN

## 2015-04-24 MED ORDER — FENTANYL CITRATE (PF) 250 MCG/5ML IJ SOLN
INTRAMUSCULAR | Status: AC
  Filled 2015-04-24: qty 5

## 2015-04-24 MED ORDER — FENTANYL CITRATE (PF) 100 MCG/2ML IJ SOLN
INTRAMUSCULAR | Status: DC | PRN
  Administered 2015-04-24: 50 ug via INTRAVENOUS
  Administered 2015-04-24: 100 ug via INTRAVENOUS
  Administered 2015-04-24 (×2): 50 ug via INTRAVENOUS

## 2015-04-24 MED ORDER — PHENYLEPHRINE HCL 10 MG/ML IJ SOLN
10.0000 mg | INTRAVENOUS | Status: DC | PRN
  Administered 2015-04-24: 15 ug/min via INTRAVENOUS

## 2015-04-24 MED ORDER — 0.9 % SODIUM CHLORIDE (POUR BTL) OPTIME
TOPICAL | Status: DC | PRN
  Administered 2015-04-24: 1000 mL

## 2015-04-24 MED ORDER — LIDOCAINE HCL (PF) 1 % IJ SOLN
INTRAMUSCULAR | Status: AC
  Filled 2015-04-24: qty 30

## 2015-04-24 MED ORDER — MIDAZOLAM HCL 5 MG/5ML IJ SOLN
INTRAMUSCULAR | Status: DC | PRN
  Administered 2015-04-24: 2 mg via INTRAVENOUS

## 2015-04-24 MED ORDER — MEPERIDINE HCL 25 MG/ML IJ SOLN: 6.2500 mg | INTRAMUSCULAR | Status: DC | PRN

## 2015-04-24 MED ORDER — PROPOFOL 10 MG/ML IV BOLUS
INTRAVENOUS | Status: AC
  Filled 2015-04-24: qty 20

## 2015-04-24 MED ORDER — ROCURONIUM BROMIDE 50 MG/5ML IV SOLN
INTRAVENOUS | Status: AC
  Filled 2015-04-24: qty 1

## 2015-04-24 MED ORDER — MIDAZOLAM HCL 2 MG/2ML IJ SOLN: 0.5000 mg | Freq: Once | INTRAMUSCULAR | Status: DC | PRN

## 2015-04-24 MED ORDER — THROMBIN 20000 UNITS EX SOLR
CUTANEOUS | Status: AC
  Filled 2015-04-24: qty 20000

## 2015-04-24 MED ORDER — LIDOCAINE HCL (PF) 1 % IJ SOLN
INTRAMUSCULAR | Status: DC | PRN
  Administered 2015-04-24: 26 mL via INTRADERMAL

## 2015-04-24 MED ORDER — SODIUM CHLORIDE 0.9 % IJ SOLN
INTRAMUSCULAR | Status: AC
  Filled 2015-04-24: qty 10

## 2015-04-24 MED ORDER — PROTAMINE SULFATE 10 MG/ML IV SOLN
INTRAVENOUS | Status: DC | PRN
  Administered 2015-04-24: 10 mg via INTRAVENOUS
  Administered 2015-04-24: 30 mg via INTRAVENOUS
  Administered 2015-04-24: 10 mg via INTRAVENOUS

## 2015-04-24 MED ORDER — OXYCODONE HCL 5 MG PO TABS: 5.0000 mg | ORAL_TABLET | Freq: Four times a day (QID) | ORAL | Status: DC | PRN

## 2015-04-24 MED ORDER — SUCCINYLCHOLINE CHLORIDE 20 MG/ML IJ SOLN
INTRAMUSCULAR | Status: AC
  Filled 2015-04-24: qty 1

## 2015-04-24 MED ORDER — PHENYLEPHRINE HCL 10 MG/ML IJ SOLN
INTRAMUSCULAR | Status: DC | PRN
  Administered 2015-04-24: 25 ug via INTRAVENOUS

## 2015-04-24 MED ORDER — OXYCODONE HCL 5 MG/5ML PO SOLN: 5.0000 mg | Freq: Once | ORAL | Status: DC

## 2015-04-24 MED ORDER — LIDOCAINE HCL (CARDIAC) 20 MG/ML IV SOLN
INTRAVENOUS | Status: DC | PRN
  Administered 2015-04-24: 40 mg via INTRAVENOUS

## 2015-04-24 MED ORDER — FENTANYL CITRATE (PF) 100 MCG/2ML IJ SOLN
INTRAMUSCULAR | Status: AC
  Administered 2015-04-24: 50 ug via INTRAVENOUS
  Filled 2015-04-24: qty 2

## 2015-04-24 MED ORDER — EPHEDRINE SULFATE 50 MG/ML IJ SOLN
INTRAMUSCULAR | Status: AC
  Filled 2015-04-24: qty 2

## 2015-04-24 MED ORDER — PROPOFOL 500 MG/50ML IV EMUL
INTRAVENOUS | Status: DC | PRN
  Administered 2015-04-24: 63 ug/kg/min via INTRAVENOUS

## 2015-04-24 MED ORDER — MIDAZOLAM HCL 2 MG/2ML IJ SOLN
INTRAMUSCULAR | Status: AC
  Filled 2015-04-24: qty 2

## 2015-04-24 MED ORDER — DIPHENHYDRAMINE HCL 50 MG/ML IJ SOLN
INTRAMUSCULAR | Status: AC
  Filled 2015-04-24: qty 1

## 2015-04-24 MED ORDER — HEPARIN SODIUM (PORCINE) 1000 UNIT/ML IJ SOLN
INTRAMUSCULAR | Status: DC | PRN
  Administered 2015-04-24: 5000 [IU] via INTRAVENOUS

## 2015-04-24 MED ORDER — OXYCODONE HCL 5 MG PO TABS
5.0000 mg | ORAL_TABLET | Freq: Once | ORAL | Status: AC
  Administered 2015-04-24: 5 mg via ORAL

## 2015-04-24 SURGICAL SUPPLY — 34 items
CANISTER SUCTION 2500CC (MISCELLANEOUS) ×2 IMPLANT
CANNULA VESSEL 3MM 2 BLNT TIP (CANNULA) ×2 IMPLANT
CLIP TI MEDIUM 6 (CLIP) IMPLANT
CLIP TI WIDE RED SMALL 6 (CLIP) IMPLANT
COVER PROBE W GEL 5X96 (DRAPES) IMPLANT
DECANTER SPIKE VIAL GLASS SM (MISCELLANEOUS) ×2 IMPLANT
DRAIN PENROSE 1/4X12 LTX STRL (WOUND CARE) ×2 IMPLANT
ELECT REM PT RETURN 9FT ADLT (ELECTROSURGICAL) ×2
ELECTRODE REM PT RTRN 9FT ADLT (ELECTROSURGICAL) ×1 IMPLANT
GEL ULTRASOUND 20GR AQUASONIC (MISCELLANEOUS) IMPLANT
GLOVE BIO SURGEON STRL SZ 6.5 (GLOVE) ×4 IMPLANT
GLOVE BIO SURGEON STRL SZ7.5 (GLOVE) ×2 IMPLANT
GLOVE BIOGEL PI IND STRL 6.5 (GLOVE) ×2 IMPLANT
GLOVE BIOGEL PI INDICATOR 6.5 (GLOVE) ×2
GLOVE SURG SS PI 7.0 STRL IVOR (GLOVE) ×2 IMPLANT
GOWN STRL REUS W/ TWL LRG LVL3 (GOWN DISPOSABLE) ×4 IMPLANT
GOWN STRL REUS W/ TWL XL LVL3 (GOWN DISPOSABLE) ×1 IMPLANT
GOWN STRL REUS W/TWL LRG LVL3 (GOWN DISPOSABLE) ×4
GOWN STRL REUS W/TWL XL LVL3 (GOWN DISPOSABLE) ×1
KIT BASIN OR (CUSTOM PROCEDURE TRAY) ×2 IMPLANT
KIT ROOM TURNOVER OR (KITS) ×2 IMPLANT
LIQUID BAND (GAUZE/BANDAGES/DRESSINGS) ×2 IMPLANT
LOOP VESSEL MINI RED (MISCELLANEOUS) IMPLANT
NS IRRIG 1000ML POUR BTL (IV SOLUTION) ×2 IMPLANT
PACK CV ACCESS (CUSTOM PROCEDURE TRAY) ×2 IMPLANT
PAD ARMBOARD 7.5X6 YLW CONV (MISCELLANEOUS) ×4 IMPLANT
SPONGE SURGIFOAM ABS GEL 100 (HEMOSTASIS) IMPLANT
SUT PROLENE 5 0 C 1 24 (SUTURE) ×6 IMPLANT
SUT PROLENE 7 0 BV 1 (SUTURE) ×2 IMPLANT
SUT VIC AB 3-0 SH 27 (SUTURE) ×1
SUT VIC AB 3-0 SH 27X BRD (SUTURE) ×1 IMPLANT
SUT VICRYL 4-0 PS2 18IN ABS (SUTURE) ×2 IMPLANT
UNDERPAD 30X30 INCONTINENT (UNDERPADS AND DIAPERS) ×2 IMPLANT
WATER STERILE IRR 1000ML POUR (IV SOLUTION) ×2 IMPLANT

## 2015-04-24 NOTE — Op Note (Signed)
Procedure: Plication of right upper extremity AV fistula  Preoperative diagnosis: Aneurysmal degeneration right arm AV fistula   Postoperative diagnosis: Same  Anesthesia: Local with IV sedation  Assistant: Leontine Locket PA-C  Operative findings: Plication of proximal third of right upper extremity AV fistula            Operative details: After obtaining informed consent, the patient was taken to the operating room. The patient was placed in supine position on the operating room table. After adequate sedation, the entire left upper extremity was prepped and draped in usual sterile fashion. A longitudinal incision was made over the proximal third of the fistula incorporating the area of aneurysmal degeneration. The fistula was dissected free circumferentially throughout its course. The patient was given 5000 units of intravenous heparin. The fistula was clamped proximally and distally above and below the aneurysms. The aneurysm was opened longitudinally and the redundant segment fistula excised. The posterior wall was reconstructed with a running 5 0 prolene suture.  The anterior wall was then resected to the point there was a 10 mm residual lumen.  The area of resection was then reapproximated using a running 5-0 Prolene suture. The clamps were removed and flow restored to the fistula. Hemostasis was obtained with administration of 50 mg of protamine and one repair stitch at the proximal anastomosis. Some of the redundant skin was then resected. The subcutaneous tissues were reapproximated using a running 3-0 Vicryl suture. The skin was closed with a 4 0 Vicryl subcuticular stitch. Dermabond was applied to the skin incision.  The patient tolerated procedure well and there were no complications. Instrument sponge and needle counts were correct at the end of the case. The patient was taken to the recovery room in stable condition.   [name], MD [facility]  [place] Office: [phone] Pager: [phone]

## 2015-04-24 NOTE — Anesthesia Preprocedure Evaluation (Addendum)
Anesthesia Evaluation  Patient identified by MRN, date of birth, ID band Patient awake    Reviewed: Allergy & Precautions, NPO status , Patient's Chart, lab work & pertinent test results  History of Anesthesia Complications Negative for: history of anesthetic complications  Airway Mallampati: II  TM Distance: >3 FB Neck ROM: Full    Dental  (+) Poor Dentition, Dental Advisory Given   Pulmonary neg pulmonary ROS,    breath sounds clear to auscultation       Cardiovascular hypertension (controlled with dialysis),  Rhythm:Regular Rate:Normal  '15 ECHO: EF 60-65%, valves OK   Neuro/Psych negative neurological ROS     GI/Hepatic GERD  ,  Endo/Other  negative endocrine ROS  Renal/GU ESRF and DialysisRenal disease (MWF dialysis, K+ 5.7)     Musculoskeletal   Abdominal   Peds  Hematology negative hematology ROS (+)   Anesthesia Other Findings   Reproductive/Obstetrics                           Anesthesia Physical Anesthesia Plan  ASA: III  Anesthesia Plan: MAC   Post-op Pain Management:    Induction:   Airway Management Planned: Natural Airway and Simple Face Mask  Additional Equipment:   Intra-op Plan:   Post-operative Plan:   Informed Consent: I have reviewed the patients History and Physical, chart, labs and discussed the procedure including the risks, benefits and alternatives for the proposed anesthesia with the patient or authorized representative who has indicated his/her understanding and acceptance.   Dental advisory given  Plan Discussed with: CRNA and Surgeon  Anesthesia Plan Comments: (Plan routine monitors, MAC)        Anesthesia Quick Evaluation

## 2015-04-24 NOTE — Interval H&P Note (Signed)
History and Physical Interval Note:  04/24/2015 7:26 AM  Gregory [name]  has presented today for surgery, with the diagnosis of End Stage Renal Disease N18.6  The various methods of treatment have been discussed with the patient and family. After consideration of risks, benefits and other options for treatment, the patient has consented to  Procedure(s): PLICATION OF ARTERIOVENOUS FISTULA (Right) as a surgical intervention .  The patient's history has been reviewed, patient examined, no change in status, stable for surgery.  I have reviewed the patient's chart and labs.  Questions were answered to the patient's satisfaction.     [name]

## 2015-04-24 NOTE — Transfer of Care (Signed)
Immediate Anesthesia Transfer of Care Note  Patient: Gregory [name]  Procedure(s) Performed: Procedure(s): PLICATION OF RIGHT ARM  ARTERIOVENOUS FISTULA (Right)  Patient Location: PACU  Anesthesia Type:MAC  Level of Consciousness: awake, alert  and oriented  Airway & Oxygen Therapy: Patient Spontanous Breathing and Patient connected to nasal cannula oxygen  Post-op Assessment: Report given to RN and Post -op Vital signs reviewed and stable  Post vital signs: Reviewed and stable  Last Vitals:  Filed Vitals:   04/24/15 0708  BP: 110/74  Pulse: 74  Temp: 36.8 C  Resp: 16    Complications: No apparent anesthesia complications

## 2015-04-24 NOTE — H&P (View-Only) (Signed)
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 He previously had plication of the central portion of the fistula in 2014. The area of concern is more proximal to this prior plication site. Patient also recently had angioplasty of the outflow by Dr. Augustin Coupe.  He denies any problems with cannulating the fistula or with access flow. Other chronic medical problems include hypertension, congestive failure, chronic back pain, iliac aneurysms previously treated with aneurysm stent graft repair. These have all been stable. He also has an 17 mm. Mesenteric artery aneurysm which is being followed.  Past Medical History  Diagnosis Date  . Hypertension   . CHF (congestive heart failure) ([place])   . Chronic back pain     "mostly lower" (11/08/2014)  . Heart failure with reduced ejection fraction ([place])   . Gout     "hands" (11/08/2014)  . GERD (gastroesophageal reflux disease)     "sometimes" (11/08/2014)  . Arthritis     "hands; basically all my joints" (11/08/2014)  . End stage renal disease ([place])     pt does not urinate; pt. states that he does dialysis on MWF; [place]" (11/08/2014)  . Anemia in chronic kidney disease    Past Surgical History  Procedure Laterality Date  . Av fistula placement Right 07/26/2009  . Ligation of arteriovenous  fistula Right 123456    Procedure: PLICATION OF ARTERIOVENOUS  FISTULA;  Surgeon: Elam Dutch, MD;  Location: [facility];  Service: Vascular;  Laterality: Right;  . Insertion of dialysis catheter N/A 02/22/2013    Procedure: INSERTION OF DIALYSIS CATHETER;  Surgeon: Elam Dutch, MD;  Location: [facility] OR;  Service: Vascular;  Laterality: N/A;  . Embolization Left 11/08/2014    internal iliac artery; Gore Excluder bifurcated stent graft for repair of common iliac aneurysm  . Endovascular stent insertion Bilateral 11/08/2014    Procedure: REPAIR OF BILATERAL ILIAC ARTERY ANEURYSM WITH  Bifurcated stent,  with coiling left internal artery;  Surgeon: Elam Dutch, MD;  Location: [facility] OR;  Service: Vascular;  Laterality: Bilateral;   Current Outpatient Prescriptions on File Prior to Visit  Medication Sig Dispense Refill  . colchicine 0.6 MG tablet Take 1 tablet by mouth daily as needed.  0  . Ferric Citrate (AURYXIA) 210 MG TABS Take 1 capsule by mouth 3 (three) times daily with meals.    . fludrocortisone (FLORINEF) 0.1 MG tablet Take 0.1 mg by mouth 3 (three) times a week.    . midodrine (PROAMATINE) 5 MG tablet Take 1 tablet by mouth 2 (two) times daily.    Marland Kitchen omeprazole (PRILOSEC) 40 MG capsule Take 40 mg by mouth daily.    . predniSONE (DELTASONE) 5 MG tablet Take 1 tablet by mouth daily as needed.  0  . promethazine (PHENERGAN) 25 MG tablet Take 25 mg by mouth every 6 (six) hours as needed for nausea or vomiting.    . SENSIPAR 30 MG tablet Take 30 mg by mouth daily.    Marland Kitchen HYDROmorphone (DILAUDID) 2 MG tablet Take 1 tablet by mouth every 6 (six) hours as needed.  0  . oxyCODONE (ROXICODONE) 5 MG immediate release tablet Take 1 tablet (5 mg total) by mouth every 6 (six) hours as needed. (Patient not taking: Reported on 04/12/2015) 20 tablet 0   Current Facility-Administered Medications on File Prior to Visit  Medication Dose Route Frequency Provider Last Rate Last Dose  . vancomycin (VANCOCIN) IVPB 1000 mg/200 mL  premix  1,000 mg Intravenous 60 min Pre-Op Elam Dutch, MD       No Known Allergies  Review of systems: He denies shortness of breath. He denies chest pain.  Physical exam:  Filed Vitals:   04/12/15 1203  BP: 102/69  Pulse: 79  Temp: 98.5 F (36.9 C)  TempSrc: Oral  Resp: 16  Height: 6' (1.829 m)  Weight: 179 lb (81.194 kg)  SpO2: 100%    Extremities: Right arm aneurysmal degeneration of proximal portion of right upper arm AV fistula approximate 5 cm diameter. Skin is not really thinned out but significant redundancy of the fistula. There is a palpable thrill in  the fistula.  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  Plan: Plication of AV fistula on Tuesday, 04/17/2015. Risks benefits possible complications and procedure details were discussed patient today including not limited to bleeding infection fistula thrombosis. He wishes to proceed. We will schedule him for CT angiogram to review his stent graft at his 2 week follow-up appointment after this.  [name], MD [facility] [place] Office: [phone] Pager: [phone]

## 2015-04-24 NOTE — Discharge Instructions (Signed)
° ° °  04/24/2015 Gregory [name] [id number] 1950/07/11  Surgeon(s): Elam Dutch, MD  Procedure(s): PLICATION OF RIGHT ARM  ARTERIOVENOUS FISTULA  x May stick graft on designated area only:  Do NOT stick over incision x 6 weeks. May stick proximal (above incision) immediately. SEE DIAGRAM.

## 2015-04-24 NOTE — Anesthesia Postprocedure Evaluation (Signed)
Anesthesia Post Note  Patient: Gregory [name]  Procedure(s) Performed: Procedure(s) (LRB): PLICATION OF RIGHT ARM  ARTERIOVENOUS FISTULA (Right)  Patient location during evaluation: PACU Anesthesia Type: MAC Level of consciousness: awake and alert, oriented and patient cooperative Pain management: pain level controlled Vital Signs Assessment: post-procedure vital signs reviewed and stable Respiratory status: spontaneous breathing, nonlabored ventilation and respiratory function stable Cardiovascular status: blood pressure returned to baseline and stable Postop Assessment: no signs of nausea or vomiting Anesthetic complications: no    Last Vitals:  Filed Vitals:   04/24/15 1000 04/24/15 1002  BP:  128/85  Pulse:  65  Temp: 36.6 C   Resp:  11    Last Pain:  Filed Vitals:   04/24/15 1009  PainSc: 3                  Rily Nickey,E. Gisel Vipond

## 2015-04-25 ENCOUNTER — Telehealth: Payer: Self-pay | Admitting: Vascular Surgery

## 2015-04-25 ENCOUNTER — Encounter (HOSPITAL_COMMUNITY): Payer: Self-pay | Admitting: Vascular Surgery

## 2015-04-25 DIAGNOSIS — N2581 Secondary hyperparathyroidism of renal origin: Secondary | ICD-10-CM | POA: Diagnosis not present

## 2015-04-25 DIAGNOSIS — D631 Anemia in chronic kidney disease: Secondary | ICD-10-CM | POA: Diagnosis not present

## 2015-04-25 DIAGNOSIS — N186 End stage renal disease: Secondary | ICD-10-CM | POA: Diagnosis not present

## 2015-04-25 NOTE — Telephone Encounter (Signed)
-----   Message from Mena Goes, RN sent at 04/24/2015 10:56 AM EST ----- Regarding: schedule   ----- Message -----    From: Gabriel Earing, PA-C    Sent: 04/24/2015   9:28 AM      To: Vvs Charge Pool  S/p plication right arm fistula 04/24/15.  F/u with Dr. Oneida Alar in 2-3 weeks.  Thanks, Aldona Bar

## 2015-04-25 NOTE — Telephone Encounter (Signed)
Unable to reach patient- added to already scheduled 01/05 appt, dpm

## 2015-04-26 ENCOUNTER — Telehealth: Payer: Self-pay | Admitting: Vascular Surgery

## 2015-04-26 NOTE — Telephone Encounter (Signed)
Recvd VM from pt on 12.21 asking that we combine his appts. req return call to [phone]  LM at [phone] @ 10:48 for Gregory [name] to call back- need to clarify what appointment he needs to combine (CT and OV ??)

## 2015-04-27 DIAGNOSIS — N186 End stage renal disease: Secondary | ICD-10-CM | POA: Diagnosis not present

## 2015-04-27 DIAGNOSIS — N2581 Secondary hyperparathyroidism of renal origin: Secondary | ICD-10-CM | POA: Diagnosis not present

## 2015-04-27 DIAGNOSIS — D631 Anemia in chronic kidney disease: Secondary | ICD-10-CM | POA: Diagnosis not present

## 2015-04-30 DIAGNOSIS — N186 End stage renal disease: Secondary | ICD-10-CM | POA: Diagnosis not present

## 2015-04-30 DIAGNOSIS — N2581 Secondary hyperparathyroidism of renal origin: Secondary | ICD-10-CM | POA: Diagnosis not present

## 2015-04-30 DIAGNOSIS — D631 Anemia in chronic kidney disease: Secondary | ICD-10-CM | POA: Diagnosis not present

## 2015-05-02 ENCOUNTER — Other Ambulatory Visit: Payer: Self-pay | Admitting: *Deleted

## 2015-05-02 DIAGNOSIS — N2581 Secondary hyperparathyroidism of renal origin: Secondary | ICD-10-CM | POA: Diagnosis not present

## 2015-05-02 DIAGNOSIS — N186 End stage renal disease: Secondary | ICD-10-CM | POA: Diagnosis not present

## 2015-05-02 DIAGNOSIS — D631 Anemia in chronic kidney disease: Secondary | ICD-10-CM | POA: Diagnosis not present

## 2015-05-03 ENCOUNTER — Ambulatory Visit
Admission: RE | Admit: 2015-05-03 | Discharge: 2015-05-03 | Disposition: A | Discharge status: Home / Self Care | Payer: Medicare Other | Source: Ambulatory Visit | Attending: Vascular Surgery | Admitting: Vascular Surgery

## 2015-05-03 ENCOUNTER — Encounter: Payer: Self-pay | Admitting: Vascular Surgery

## 2015-05-03 DIAGNOSIS — I723 Aneurysm of iliac artery: Secondary | ICD-10-CM

## 2015-05-03 DIAGNOSIS — I728 Aneurysm of other specified arteries: Secondary | ICD-10-CM

## 2015-05-03 MED ORDER — IOPAMIDOL (ISOVUE-370) INJECTION 76%
75.0000 mL | Freq: Once | INTRAVENOUS | Status: AC | PRN
  Administered 2015-05-03: 75 mL via INTRAVENOUS

## 2015-05-04 DIAGNOSIS — N186 End stage renal disease: Secondary | ICD-10-CM | POA: Diagnosis not present

## 2015-05-04 DIAGNOSIS — D631 Anemia in chronic kidney disease: Secondary | ICD-10-CM | POA: Diagnosis not present

## 2015-05-04 DIAGNOSIS — N2581 Secondary hyperparathyroidism of renal origin: Secondary | ICD-10-CM | POA: Diagnosis not present

## 2015-05-05 DIAGNOSIS — I120 Hypertensive chronic kidney disease with stage 5 chronic kidney disease or end stage renal disease: Secondary | ICD-10-CM | POA: Diagnosis not present

## 2015-05-05 DIAGNOSIS — Z992 Dependence on renal dialysis: Secondary | ICD-10-CM | POA: Diagnosis not present

## 2015-05-05 DIAGNOSIS — N186 End stage renal disease: Secondary | ICD-10-CM | POA: Diagnosis not present

## 2015-05-07 DIAGNOSIS — E8779 Other fluid overload: Secondary | ICD-10-CM | POA: Diagnosis not present

## 2015-05-07 DIAGNOSIS — N2581 Secondary hyperparathyroidism of renal origin: Secondary | ICD-10-CM | POA: Diagnosis not present

## 2015-05-07 DIAGNOSIS — D631 Anemia in chronic kidney disease: Secondary | ICD-10-CM | POA: Diagnosis not present

## 2015-05-07 DIAGNOSIS — E875 Hyperkalemia: Secondary | ICD-10-CM | POA: Diagnosis not present

## 2015-05-07 DIAGNOSIS — N186 End stage renal disease: Secondary | ICD-10-CM | POA: Diagnosis not present

## 2015-05-09 DIAGNOSIS — E8779 Other fluid overload: Secondary | ICD-10-CM | POA: Diagnosis not present

## 2015-05-09 DIAGNOSIS — N186 End stage renal disease: Secondary | ICD-10-CM | POA: Diagnosis not present

## 2015-05-09 DIAGNOSIS — E875 Hyperkalemia: Secondary | ICD-10-CM | POA: Diagnosis not present

## 2015-05-09 DIAGNOSIS — N2581 Secondary hyperparathyroidism of renal origin: Secondary | ICD-10-CM | POA: Diagnosis not present

## 2015-05-09 DIAGNOSIS — D631 Anemia in chronic kidney disease: Secondary | ICD-10-CM | POA: Diagnosis not present

## 2015-05-10 ENCOUNTER — Ambulatory Visit (INDEPENDENT_AMBULATORY_CARE_PROVIDER_SITE_OTHER): Payer: Medicare Other | Admitting: Vascular Surgery

## 2015-05-10 ENCOUNTER — Other Ambulatory Visit: Payer: Self-pay | Admitting: *Deleted

## 2015-05-10 ENCOUNTER — Encounter: Payer: Self-pay | Admitting: Vascular Surgery

## 2015-05-10 VITALS — BP 100/69 | HR 85 | Temp 98.5°F | Resp 16 | Ht 73.0 in | Wt 182.0 lb

## 2015-05-10 DIAGNOSIS — I723 Aneurysm of iliac artery: Secondary | ICD-10-CM

## 2015-05-10 MED ORDER — OXYCODONE HCL 5 MG PO TABS: 5.0000 mg | ORAL_TABLET | Freq: Four times a day (QID) | ORAL | Status: DC | PRN

## 2015-05-10 NOTE — Progress Notes (Signed)
Patient is a 65 year old male who returns for follow-up today after recent plication of a right upper arm AV fistula. He has also previously had aortic and iliac artery stent graft repair. 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 He denies any abdominal or back pain. He is still having some soreness in his right upper arm incision. He denies any drainage or redness around the incision. They are successfully cannulating the fistula for dialysis. Chronic medical problems remain hypertension end-stage renal disease chronic back pain and congestive heart failure all of which are currently been stable. He is followed by Dr. Dossie Der for his back pain.  Past Medical History  Diagnosis Date  . Hypertension   . CHF (congestive heart failure) ([employer])   . Chronic back pain     "mostly lower" (11/08/2014)  . Heart failure with reduced ejection fraction ([place])   . Gout     "hands" (11/08/2014)  . GERD (gastroesophageal reflux disease)     "sometimes" (11/08/2014)  . Arthritis     "hands; basically all my joints" (11/08/2014)  . End stage renal disease ([place])     pt does not urinate; pt. states that he does dialysis on MWF; [place]" (11/08/2014)  . Anemia in chronic kidney disease    Past Surgical History  Procedure Laterality Date  . Av fistula placement Right 07/26/2009  . Ligation of arteriovenous  fistula Right [id number]    Procedure: PLICATION OF ARTERIOVENOUS  FISTULA;  Surgeon: Elam Dutch, MD;  Location: [facility];  Service: Vascular;  Laterality: Right;  . Insertion of dialysis catheter N/A 02/22/2013    Procedure: INSERTION OF DIALYSIS CATHETER;  Surgeon: Elam Dutch, MD;  Location[facility];  Service: Vascular;  Laterality: N/A;  . Embolization Left 11/08/2014    internal iliac artery; [place] Excluder bifurcated stent graft for repair of common iliac aneurysm  . Endovascular  stent insertion Bilateral 11/08/2014    Procedure: REPAIR OF BILATERAL ILIAC ARTERY ANEURYSM WITH  Bifurcated stent, with coiling left internal artery;  Surgeon: Elam Dutch, MD;  Location: [facility] OR;  Service: Vascular;  Laterality: Bilateral;  . Revison of arteriovenous fistula Right [id number]    Procedure: PLICATION OF RIGHT ARM  ARTERIOVENOUS FISTULA;  Surgeon: Elam Dutch, MD;  Location: [facility] OR;  Service: Vascular;  Laterality: Right;    Physical exam:  Filed Vitals:   05/10/15 0838  BP: 100/69  Pulse: 85  Temp: 98.5 F (36.9 C)  TempSrc: Oral  Resp: 16  Height: 6\' 1"  (1.854 m)  Weight: 182 lb (82.555 kg)  SpO2: 100%    Right upper extremity: Palpable thrill in fistula well-healed incision   Abdomen: Soft non-tender 2+ femoral pulses   Data: The patient recently had a CT scan of abdomen and pelvis. This showed 19 mm aneurysms of the spear mesenteric artery celiac artery and splenic artery all of which were stable. His aneurysm stent graft is well positioned just below the renal arteries. The left internal iliac arteries well excluded with coils. The left common iliac artery aneurysm is excluded with the stent extending into the left external iliac artery. The left common iliac artery aneurysm is 3.7 cm in diameter. Aortic diameter was 2.3 cm , right internal iliac 1.6 cm right common iliac 2.6 cm   assessment: Doing well status post right arm plication of fistula. His iliac aneurysms are well  excluded. We will need to do continued surveillance of the mesenteric as well as the right common iliac artery.   Plan: He'll have a follow-up CT angiogram abdomen and pelvis in 6 months. From a vascular standpoint he should be okay for kidney transplantation if he is deemed a candidate by the transplant service. Patient requested more pain medication today. However he has a pain contract with Dr. Nelva Bush. He will discussed with Dr. Nelva Bush' his office extra pain medication.  [name],  MD [facility] Office: [phone] Pager: [phone]

## 2015-05-11 ENCOUNTER — Encounter: Payer: Self-pay | Admitting: Nephrology

## 2015-05-11 DIAGNOSIS — N186 End stage renal disease: Secondary | ICD-10-CM | POA: Diagnosis not present

## 2015-05-11 DIAGNOSIS — E875 Hyperkalemia: Secondary | ICD-10-CM | POA: Diagnosis not present

## 2015-05-11 DIAGNOSIS — N2581 Secondary hyperparathyroidism of renal origin: Secondary | ICD-10-CM | POA: Diagnosis not present

## 2015-05-11 DIAGNOSIS — E8779 Other fluid overload: Secondary | ICD-10-CM | POA: Diagnosis not present

## 2015-05-11 DIAGNOSIS — D631 Anemia in chronic kidney disease: Secondary | ICD-10-CM | POA: Diagnosis not present

## 2015-05-16 DIAGNOSIS — N2581 Secondary hyperparathyroidism of renal origin: Secondary | ICD-10-CM | POA: Diagnosis not present

## 2015-05-16 DIAGNOSIS — D631 Anemia in chronic kidney disease: Secondary | ICD-10-CM | POA: Diagnosis not present

## 2015-05-16 DIAGNOSIS — E875 Hyperkalemia: Secondary | ICD-10-CM | POA: Diagnosis not present

## 2015-05-16 DIAGNOSIS — N186 End stage renal disease: Secondary | ICD-10-CM | POA: Diagnosis not present

## 2015-05-16 DIAGNOSIS — E8779 Other fluid overload: Secondary | ICD-10-CM | POA: Diagnosis not present

## 2015-05-18 DIAGNOSIS — E8779 Other fluid overload: Secondary | ICD-10-CM | POA: Diagnosis not present

## 2015-05-18 DIAGNOSIS — N2581 Secondary hyperparathyroidism of renal origin: Secondary | ICD-10-CM | POA: Diagnosis not present

## 2015-05-18 DIAGNOSIS — N186 End stage renal disease: Secondary | ICD-10-CM | POA: Diagnosis not present

## 2015-05-18 DIAGNOSIS — E875 Hyperkalemia: Secondary | ICD-10-CM | POA: Diagnosis not present

## 2015-05-18 DIAGNOSIS — D631 Anemia in chronic kidney disease: Secondary | ICD-10-CM | POA: Diagnosis not present

## 2015-05-21 DIAGNOSIS — E8779 Other fluid overload: Secondary | ICD-10-CM | POA: Diagnosis not present

## 2015-05-21 DIAGNOSIS — E875 Hyperkalemia: Secondary | ICD-10-CM | POA: Diagnosis not present

## 2015-05-21 DIAGNOSIS — N186 End stage renal disease: Secondary | ICD-10-CM | POA: Diagnosis not present

## 2015-05-21 DIAGNOSIS — N2581 Secondary hyperparathyroidism of renal origin: Secondary | ICD-10-CM | POA: Diagnosis not present

## 2015-05-21 DIAGNOSIS — D631 Anemia in chronic kidney disease: Secondary | ICD-10-CM | POA: Diagnosis not present

## 2015-05-22 DIAGNOSIS — E875 Hyperkalemia: Secondary | ICD-10-CM | POA: Diagnosis not present

## 2015-05-22 DIAGNOSIS — E8779 Other fluid overload: Secondary | ICD-10-CM | POA: Diagnosis not present

## 2015-05-22 DIAGNOSIS — N2581 Secondary hyperparathyroidism of renal origin: Secondary | ICD-10-CM | POA: Diagnosis not present

## 2015-05-22 DIAGNOSIS — D631 Anemia in chronic kidney disease: Secondary | ICD-10-CM | POA: Diagnosis not present

## 2015-05-22 DIAGNOSIS — N186 End stage renal disease: Secondary | ICD-10-CM | POA: Diagnosis not present

## 2015-05-23 DIAGNOSIS — E8779 Other fluid overload: Secondary | ICD-10-CM | POA: Diagnosis not present

## 2015-05-23 DIAGNOSIS — E875 Hyperkalemia: Secondary | ICD-10-CM | POA: Diagnosis not present

## 2015-05-23 DIAGNOSIS — N2581 Secondary hyperparathyroidism of renal origin: Secondary | ICD-10-CM | POA: Diagnosis not present

## 2015-05-23 DIAGNOSIS — D631 Anemia in chronic kidney disease: Secondary | ICD-10-CM | POA: Diagnosis not present

## 2015-05-23 DIAGNOSIS — N186 End stage renal disease: Secondary | ICD-10-CM | POA: Diagnosis not present

## 2015-05-25 DIAGNOSIS — D631 Anemia in chronic kidney disease: Secondary | ICD-10-CM | POA: Diagnosis not present

## 2015-05-25 DIAGNOSIS — E875 Hyperkalemia: Secondary | ICD-10-CM | POA: Diagnosis not present

## 2015-05-25 DIAGNOSIS — E8779 Other fluid overload: Secondary | ICD-10-CM | POA: Diagnosis not present

## 2015-05-25 DIAGNOSIS — N186 End stage renal disease: Secondary | ICD-10-CM | POA: Diagnosis not present

## 2015-05-25 DIAGNOSIS — N2581 Secondary hyperparathyroidism of renal origin: Secondary | ICD-10-CM | POA: Diagnosis not present

## 2015-05-28 DIAGNOSIS — E875 Hyperkalemia: Secondary | ICD-10-CM | POA: Diagnosis not present

## 2015-05-28 DIAGNOSIS — D631 Anemia in chronic kidney disease: Secondary | ICD-10-CM | POA: Diagnosis not present

## 2015-05-28 DIAGNOSIS — N186 End stage renal disease: Secondary | ICD-10-CM | POA: Diagnosis not present

## 2015-05-28 DIAGNOSIS — N2581 Secondary hyperparathyroidism of renal origin: Secondary | ICD-10-CM | POA: Diagnosis not present

## 2015-05-28 DIAGNOSIS — E8779 Other fluid overload: Secondary | ICD-10-CM | POA: Diagnosis not present

## 2015-05-30 DIAGNOSIS — N2581 Secondary hyperparathyroidism of renal origin: Secondary | ICD-10-CM | POA: Diagnosis not present

## 2015-05-30 DIAGNOSIS — D631 Anemia in chronic kidney disease: Secondary | ICD-10-CM | POA: Diagnosis not present

## 2015-05-30 DIAGNOSIS — N186 End stage renal disease: Secondary | ICD-10-CM | POA: Diagnosis not present

## 2015-05-30 DIAGNOSIS — E875 Hyperkalemia: Secondary | ICD-10-CM | POA: Diagnosis not present

## 2015-05-30 DIAGNOSIS — E8779 Other fluid overload: Secondary | ICD-10-CM | POA: Diagnosis not present

## 2015-06-01 DIAGNOSIS — N186 End stage renal disease: Secondary | ICD-10-CM | POA: Diagnosis not present

## 2015-06-01 DIAGNOSIS — E8779 Other fluid overload: Secondary | ICD-10-CM | POA: Diagnosis not present

## 2015-06-01 DIAGNOSIS — N2581 Secondary hyperparathyroidism of renal origin: Secondary | ICD-10-CM | POA: Diagnosis not present

## 2015-06-01 DIAGNOSIS — E875 Hyperkalemia: Secondary | ICD-10-CM | POA: Diagnosis not present

## 2015-06-01 DIAGNOSIS — D631 Anemia in chronic kidney disease: Secondary | ICD-10-CM | POA: Diagnosis not present

## 2015-06-04 DIAGNOSIS — E875 Hyperkalemia: Secondary | ICD-10-CM | POA: Diagnosis not present

## 2015-06-04 DIAGNOSIS — E8779 Other fluid overload: Secondary | ICD-10-CM | POA: Diagnosis not present

## 2015-06-04 DIAGNOSIS — N186 End stage renal disease: Secondary | ICD-10-CM | POA: Diagnosis not present

## 2015-06-04 DIAGNOSIS — D631 Anemia in chronic kidney disease: Secondary | ICD-10-CM | POA: Diagnosis not present

## 2015-06-04 DIAGNOSIS — N2581 Secondary hyperparathyroidism of renal origin: Secondary | ICD-10-CM | POA: Diagnosis not present

## 2015-06-05 DIAGNOSIS — I120 Hypertensive chronic kidney disease with stage 5 chronic kidney disease or end stage renal disease: Secondary | ICD-10-CM | POA: Diagnosis not present

## 2015-06-05 DIAGNOSIS — N186 End stage renal disease: Secondary | ICD-10-CM | POA: Diagnosis not present

## 2015-06-05 DIAGNOSIS — Z992 Dependence on renal dialysis: Secondary | ICD-10-CM | POA: Diagnosis not present

## 2015-06-06 DIAGNOSIS — D631 Anemia in chronic kidney disease: Secondary | ICD-10-CM | POA: Diagnosis not present

## 2015-06-06 DIAGNOSIS — N2581 Secondary hyperparathyroidism of renal origin: Secondary | ICD-10-CM | POA: Diagnosis not present

## 2015-06-06 DIAGNOSIS — N186 End stage renal disease: Secondary | ICD-10-CM | POA: Diagnosis not present

## 2015-06-08 DIAGNOSIS — N186 End stage renal disease: Secondary | ICD-10-CM | POA: Diagnosis not present

## 2015-06-08 DIAGNOSIS — N2581 Secondary hyperparathyroidism of renal origin: Secondary | ICD-10-CM | POA: Diagnosis not present

## 2015-06-08 DIAGNOSIS — D631 Anemia in chronic kidney disease: Secondary | ICD-10-CM | POA: Diagnosis not present

## 2015-06-11 DIAGNOSIS — N2581 Secondary hyperparathyroidism of renal origin: Secondary | ICD-10-CM | POA: Diagnosis not present

## 2015-06-11 DIAGNOSIS — N186 End stage renal disease: Secondary | ICD-10-CM | POA: Diagnosis not present

## 2015-06-11 DIAGNOSIS — D631 Anemia in chronic kidney disease: Secondary | ICD-10-CM | POA: Diagnosis not present

## 2015-06-13 DIAGNOSIS — D631 Anemia in chronic kidney disease: Secondary | ICD-10-CM | POA: Diagnosis not present

## 2015-06-13 DIAGNOSIS — N186 End stage renal disease: Secondary | ICD-10-CM | POA: Diagnosis not present

## 2015-06-13 DIAGNOSIS — N2581 Secondary hyperparathyroidism of renal origin: Secondary | ICD-10-CM | POA: Diagnosis not present

## 2015-06-15 DIAGNOSIS — D631 Anemia in chronic kidney disease: Secondary | ICD-10-CM | POA: Diagnosis not present

## 2015-06-15 DIAGNOSIS — N186 End stage renal disease: Secondary | ICD-10-CM | POA: Diagnosis not present

## 2015-06-15 DIAGNOSIS — N2581 Secondary hyperparathyroidism of renal origin: Secondary | ICD-10-CM | POA: Diagnosis not present

## 2015-06-18 DIAGNOSIS — D631 Anemia in chronic kidney disease: Secondary | ICD-10-CM | POA: Diagnosis not present

## 2015-06-18 DIAGNOSIS — N2581 Secondary hyperparathyroidism of renal origin: Secondary | ICD-10-CM | POA: Diagnosis not present

## 2015-06-18 DIAGNOSIS — N186 End stage renal disease: Secondary | ICD-10-CM | POA: Diagnosis not present

## 2015-06-20 DIAGNOSIS — N186 End stage renal disease: Secondary | ICD-10-CM | POA: Diagnosis not present

## 2015-06-20 DIAGNOSIS — D631 Anemia in chronic kidney disease: Secondary | ICD-10-CM | POA: Diagnosis not present

## 2015-06-20 DIAGNOSIS — N2581 Secondary hyperparathyroidism of renal origin: Secondary | ICD-10-CM | POA: Diagnosis not present

## 2015-06-22 DIAGNOSIS — N186 End stage renal disease: Secondary | ICD-10-CM | POA: Diagnosis not present

## 2015-06-22 DIAGNOSIS — N2581 Secondary hyperparathyroidism of renal origin: Secondary | ICD-10-CM | POA: Diagnosis not present

## 2015-06-22 DIAGNOSIS — D631 Anemia in chronic kidney disease: Secondary | ICD-10-CM | POA: Diagnosis not present

## 2015-06-25 DIAGNOSIS — D631 Anemia in chronic kidney disease: Secondary | ICD-10-CM | POA: Diagnosis not present

## 2015-06-25 DIAGNOSIS — N186 End stage renal disease: Secondary | ICD-10-CM | POA: Diagnosis not present

## 2015-06-25 DIAGNOSIS — N2581 Secondary hyperparathyroidism of renal origin: Secondary | ICD-10-CM | POA: Diagnosis not present

## 2015-06-27 DIAGNOSIS — N2581 Secondary hyperparathyroidism of renal origin: Secondary | ICD-10-CM | POA: Diagnosis not present

## 2015-06-27 DIAGNOSIS — N186 End stage renal disease: Secondary | ICD-10-CM | POA: Diagnosis not present

## 2015-06-27 DIAGNOSIS — D631 Anemia in chronic kidney disease: Secondary | ICD-10-CM | POA: Diagnosis not present

## 2015-06-29 DIAGNOSIS — D631 Anemia in chronic kidney disease: Secondary | ICD-10-CM | POA: Diagnosis not present

## 2015-06-29 DIAGNOSIS — N2581 Secondary hyperparathyroidism of renal origin: Secondary | ICD-10-CM | POA: Diagnosis not present

## 2015-06-29 DIAGNOSIS — N186 End stage renal disease: Secondary | ICD-10-CM | POA: Diagnosis not present

## 2015-07-02 DIAGNOSIS — N2581 Secondary hyperparathyroidism of renal origin: Secondary | ICD-10-CM | POA: Diagnosis not present

## 2015-07-02 DIAGNOSIS — D631 Anemia in chronic kidney disease: Secondary | ICD-10-CM | POA: Diagnosis not present

## 2015-07-02 DIAGNOSIS — N186 End stage renal disease: Secondary | ICD-10-CM | POA: Diagnosis not present

## 2015-07-03 DIAGNOSIS — N186 End stage renal disease: Secondary | ICD-10-CM | POA: Diagnosis not present

## 2015-07-03 DIAGNOSIS — Z992 Dependence on renal dialysis: Secondary | ICD-10-CM | POA: Diagnosis not present

## 2015-07-03 DIAGNOSIS — I120 Hypertensive chronic kidney disease with stage 5 chronic kidney disease or end stage renal disease: Secondary | ICD-10-CM | POA: Diagnosis not present

## 2015-07-04 DIAGNOSIS — N2581 Secondary hyperparathyroidism of renal origin: Secondary | ICD-10-CM | POA: Diagnosis not present

## 2015-07-04 DIAGNOSIS — D631 Anemia in chronic kidney disease: Secondary | ICD-10-CM | POA: Diagnosis not present

## 2015-07-04 DIAGNOSIS — N186 End stage renal disease: Secondary | ICD-10-CM | POA: Diagnosis not present

## 2015-07-06 DIAGNOSIS — N186 End stage renal disease: Secondary | ICD-10-CM | POA: Diagnosis not present

## 2015-07-06 DIAGNOSIS — N2581 Secondary hyperparathyroidism of renal origin: Secondary | ICD-10-CM | POA: Diagnosis not present

## 2015-07-06 DIAGNOSIS — D631 Anemia in chronic kidney disease: Secondary | ICD-10-CM | POA: Diagnosis not present

## 2015-07-09 DIAGNOSIS — N186 End stage renal disease: Secondary | ICD-10-CM | POA: Diagnosis not present

## 2015-07-09 DIAGNOSIS — D631 Anemia in chronic kidney disease: Secondary | ICD-10-CM | POA: Diagnosis not present

## 2015-07-09 DIAGNOSIS — N2581 Secondary hyperparathyroidism of renal origin: Secondary | ICD-10-CM | POA: Diagnosis not present

## 2015-07-11 DIAGNOSIS — N186 End stage renal disease: Secondary | ICD-10-CM | POA: Diagnosis not present

## 2015-07-11 DIAGNOSIS — N2581 Secondary hyperparathyroidism of renal origin: Secondary | ICD-10-CM | POA: Diagnosis not present

## 2015-07-11 DIAGNOSIS — D631 Anemia in chronic kidney disease: Secondary | ICD-10-CM | POA: Diagnosis not present

## 2015-07-13 DIAGNOSIS — D631 Anemia in chronic kidney disease: Secondary | ICD-10-CM | POA: Diagnosis not present

## 2015-07-13 DIAGNOSIS — N2581 Secondary hyperparathyroidism of renal origin: Secondary | ICD-10-CM | POA: Diagnosis not present

## 2015-07-13 DIAGNOSIS — N186 End stage renal disease: Secondary | ICD-10-CM | POA: Diagnosis not present

## 2015-07-16 DIAGNOSIS — N186 End stage renal disease: Secondary | ICD-10-CM | POA: Diagnosis not present

## 2015-07-16 DIAGNOSIS — D631 Anemia in chronic kidney disease: Secondary | ICD-10-CM | POA: Diagnosis not present

## 2015-07-16 DIAGNOSIS — N2581 Secondary hyperparathyroidism of renal origin: Secondary | ICD-10-CM | POA: Diagnosis not present

## 2015-07-18 DIAGNOSIS — N2581 Secondary hyperparathyroidism of renal origin: Secondary | ICD-10-CM | POA: Diagnosis not present

## 2015-07-18 DIAGNOSIS — N186 End stage renal disease: Secondary | ICD-10-CM | POA: Diagnosis not present

## 2015-07-18 DIAGNOSIS — D631 Anemia in chronic kidney disease: Secondary | ICD-10-CM | POA: Diagnosis not present

## 2015-07-20 DIAGNOSIS — N186 End stage renal disease: Secondary | ICD-10-CM | POA: Diagnosis not present

## 2015-07-20 DIAGNOSIS — N2581 Secondary hyperparathyroidism of renal origin: Secondary | ICD-10-CM | POA: Diagnosis not present

## 2015-07-20 DIAGNOSIS — D631 Anemia in chronic kidney disease: Secondary | ICD-10-CM | POA: Diagnosis not present

## 2015-07-23 DIAGNOSIS — N186 End stage renal disease: Secondary | ICD-10-CM | POA: Diagnosis not present

## 2015-07-23 DIAGNOSIS — N2581 Secondary hyperparathyroidism of renal origin: Secondary | ICD-10-CM | POA: Diagnosis not present

## 2015-07-23 DIAGNOSIS — D631 Anemia in chronic kidney disease: Secondary | ICD-10-CM | POA: Diagnosis not present

## 2015-07-25 DIAGNOSIS — D631 Anemia in chronic kidney disease: Secondary | ICD-10-CM | POA: Diagnosis not present

## 2015-07-25 DIAGNOSIS — N2581 Secondary hyperparathyroidism of renal origin: Secondary | ICD-10-CM | POA: Diagnosis not present

## 2015-07-25 DIAGNOSIS — N186 End stage renal disease: Secondary | ICD-10-CM | POA: Diagnosis not present

## 2015-07-27 DIAGNOSIS — N2581 Secondary hyperparathyroidism of renal origin: Secondary | ICD-10-CM | POA: Diagnosis not present

## 2015-07-27 DIAGNOSIS — N186 End stage renal disease: Secondary | ICD-10-CM | POA: Diagnosis not present

## 2015-07-27 DIAGNOSIS — D631 Anemia in chronic kidney disease: Secondary | ICD-10-CM | POA: Diagnosis not present

## 2015-07-30 DIAGNOSIS — N186 End stage renal disease: Secondary | ICD-10-CM | POA: Diagnosis not present

## 2015-07-30 DIAGNOSIS — N2581 Secondary hyperparathyroidism of renal origin: Secondary | ICD-10-CM | POA: Diagnosis not present

## 2015-07-30 DIAGNOSIS — D631 Anemia in chronic kidney disease: Secondary | ICD-10-CM | POA: Diagnosis not present

## 2015-08-01 DIAGNOSIS — N186 End stage renal disease: Secondary | ICD-10-CM | POA: Diagnosis not present

## 2015-08-01 DIAGNOSIS — N2581 Secondary hyperparathyroidism of renal origin: Secondary | ICD-10-CM | POA: Diagnosis not present

## 2015-08-01 DIAGNOSIS — D631 Anemia in chronic kidney disease: Secondary | ICD-10-CM | POA: Diagnosis not present

## 2015-08-03 DIAGNOSIS — I120 Hypertensive chronic kidney disease with stage 5 chronic kidney disease or end stage renal disease: Secondary | ICD-10-CM | POA: Diagnosis not present

## 2015-08-03 DIAGNOSIS — Z992 Dependence on renal dialysis: Secondary | ICD-10-CM | POA: Diagnosis not present

## 2015-08-03 DIAGNOSIS — D631 Anemia in chronic kidney disease: Secondary | ICD-10-CM | POA: Diagnosis not present

## 2015-08-03 DIAGNOSIS — N2581 Secondary hyperparathyroidism of renal origin: Secondary | ICD-10-CM | POA: Diagnosis not present

## 2015-08-03 DIAGNOSIS — N186 End stage renal disease: Secondary | ICD-10-CM | POA: Diagnosis not present

## 2015-08-06 DIAGNOSIS — D631 Anemia in chronic kidney disease: Secondary | ICD-10-CM | POA: Diagnosis not present

## 2015-08-06 DIAGNOSIS — N186 End stage renal disease: Secondary | ICD-10-CM | POA: Diagnosis not present

## 2015-08-06 DIAGNOSIS — N2581 Secondary hyperparathyroidism of renal origin: Secondary | ICD-10-CM | POA: Diagnosis not present

## 2015-08-08 DIAGNOSIS — N186 End stage renal disease: Secondary | ICD-10-CM | POA: Diagnosis not present

## 2015-08-08 DIAGNOSIS — D631 Anemia in chronic kidney disease: Secondary | ICD-10-CM | POA: Diagnosis not present

## 2015-08-08 DIAGNOSIS — N2581 Secondary hyperparathyroidism of renal origin: Secondary | ICD-10-CM | POA: Diagnosis not present

## 2015-08-10 DIAGNOSIS — D631 Anemia in chronic kidney disease: Secondary | ICD-10-CM | POA: Diagnosis not present

## 2015-08-10 DIAGNOSIS — N186 End stage renal disease: Secondary | ICD-10-CM | POA: Diagnosis not present

## 2015-08-10 DIAGNOSIS — N2581 Secondary hyperparathyroidism of renal origin: Secondary | ICD-10-CM | POA: Diagnosis not present

## 2015-08-13 DIAGNOSIS — N2581 Secondary hyperparathyroidism of renal origin: Secondary | ICD-10-CM | POA: Diagnosis not present

## 2015-08-13 DIAGNOSIS — D631 Anemia in chronic kidney disease: Secondary | ICD-10-CM | POA: Diagnosis not present

## 2015-08-13 DIAGNOSIS — N186 End stage renal disease: Secondary | ICD-10-CM | POA: Diagnosis not present

## 2015-08-15 DIAGNOSIS — D631 Anemia in chronic kidney disease: Secondary | ICD-10-CM | POA: Diagnosis not present

## 2015-08-15 DIAGNOSIS — N186 End stage renal disease: Secondary | ICD-10-CM | POA: Diagnosis not present

## 2015-08-15 DIAGNOSIS — N2581 Secondary hyperparathyroidism of renal origin: Secondary | ICD-10-CM | POA: Diagnosis not present

## 2015-08-17 DIAGNOSIS — N186 End stage renal disease: Secondary | ICD-10-CM | POA: Diagnosis not present

## 2015-08-17 DIAGNOSIS — N2581 Secondary hyperparathyroidism of renal origin: Secondary | ICD-10-CM | POA: Diagnosis not present

## 2015-08-17 DIAGNOSIS — D631 Anemia in chronic kidney disease: Secondary | ICD-10-CM | POA: Diagnosis not present

## 2015-08-20 DIAGNOSIS — D631 Anemia in chronic kidney disease: Secondary | ICD-10-CM | POA: Diagnosis not present

## 2015-08-20 DIAGNOSIS — N186 End stage renal disease: Secondary | ICD-10-CM | POA: Diagnosis not present

## 2015-08-20 DIAGNOSIS — N2581 Secondary hyperparathyroidism of renal origin: Secondary | ICD-10-CM | POA: Diagnosis not present

## 2015-08-21 DIAGNOSIS — Z79891 Long term (current) use of opiate analgesic: Secondary | ICD-10-CM | POA: Diagnosis not present

## 2015-08-21 DIAGNOSIS — M5136 Other intervertebral disc degeneration, lumbar region: Secondary | ICD-10-CM | POA: Diagnosis not present

## 2015-08-21 DIAGNOSIS — G894 Chronic pain syndrome: Secondary | ICD-10-CM | POA: Diagnosis not present

## 2015-08-22 DIAGNOSIS — N186 End stage renal disease: Secondary | ICD-10-CM | POA: Diagnosis not present

## 2015-08-22 DIAGNOSIS — N2581 Secondary hyperparathyroidism of renal origin: Secondary | ICD-10-CM | POA: Diagnosis not present

## 2015-08-22 DIAGNOSIS — D631 Anemia in chronic kidney disease: Secondary | ICD-10-CM | POA: Diagnosis not present

## 2015-08-24 DIAGNOSIS — D631 Anemia in chronic kidney disease: Secondary | ICD-10-CM | POA: Diagnosis not present

## 2015-08-24 DIAGNOSIS — N2581 Secondary hyperparathyroidism of renal origin: Secondary | ICD-10-CM | POA: Diagnosis not present

## 2015-08-24 DIAGNOSIS — N186 End stage renal disease: Secondary | ICD-10-CM | POA: Diagnosis not present

## 2015-08-27 DIAGNOSIS — N186 End stage renal disease: Secondary | ICD-10-CM | POA: Diagnosis not present

## 2015-08-27 DIAGNOSIS — N2581 Secondary hyperparathyroidism of renal origin: Secondary | ICD-10-CM | POA: Diagnosis not present

## 2015-08-27 DIAGNOSIS — D631 Anemia in chronic kidney disease: Secondary | ICD-10-CM | POA: Diagnosis not present

## 2015-08-29 DIAGNOSIS — D631 Anemia in chronic kidney disease: Secondary | ICD-10-CM | POA: Diagnosis not present

## 2015-08-29 DIAGNOSIS — N2581 Secondary hyperparathyroidism of renal origin: Secondary | ICD-10-CM | POA: Diagnosis not present

## 2015-08-29 DIAGNOSIS — N186 End stage renal disease: Secondary | ICD-10-CM | POA: Diagnosis not present

## 2015-08-31 DIAGNOSIS — N186 End stage renal disease: Secondary | ICD-10-CM | POA: Diagnosis not present

## 2015-08-31 DIAGNOSIS — D631 Anemia in chronic kidney disease: Secondary | ICD-10-CM | POA: Diagnosis not present

## 2015-08-31 DIAGNOSIS — N2581 Secondary hyperparathyroidism of renal origin: Secondary | ICD-10-CM | POA: Diagnosis not present

## 2015-09-02 DIAGNOSIS — N186 End stage renal disease: Secondary | ICD-10-CM | POA: Diagnosis not present

## 2015-09-02 DIAGNOSIS — I120 Hypertensive chronic kidney disease with stage 5 chronic kidney disease or end stage renal disease: Secondary | ICD-10-CM | POA: Diagnosis not present

## 2015-09-02 DIAGNOSIS — Z992 Dependence on renal dialysis: Secondary | ICD-10-CM | POA: Diagnosis not present

## 2015-09-03 DIAGNOSIS — D631 Anemia in chronic kidney disease: Secondary | ICD-10-CM | POA: Diagnosis not present

## 2015-09-03 DIAGNOSIS — N2581 Secondary hyperparathyroidism of renal origin: Secondary | ICD-10-CM | POA: Diagnosis not present

## 2015-09-03 DIAGNOSIS — N186 End stage renal disease: Secondary | ICD-10-CM | POA: Diagnosis not present

## 2015-09-05 DIAGNOSIS — N2581 Secondary hyperparathyroidism of renal origin: Secondary | ICD-10-CM | POA: Diagnosis not present

## 2015-09-05 DIAGNOSIS — D631 Anemia in chronic kidney disease: Secondary | ICD-10-CM | POA: Diagnosis not present

## 2015-09-05 DIAGNOSIS — N186 End stage renal disease: Secondary | ICD-10-CM | POA: Diagnosis not present

## 2015-09-07 DIAGNOSIS — N2581 Secondary hyperparathyroidism of renal origin: Secondary | ICD-10-CM | POA: Diagnosis not present

## 2015-09-07 DIAGNOSIS — N186 End stage renal disease: Secondary | ICD-10-CM | POA: Diagnosis not present

## 2015-09-07 DIAGNOSIS — D631 Anemia in chronic kidney disease: Secondary | ICD-10-CM | POA: Diagnosis not present

## 2015-09-10 DIAGNOSIS — D631 Anemia in chronic kidney disease: Secondary | ICD-10-CM | POA: Diagnosis not present

## 2015-09-10 DIAGNOSIS — N2581 Secondary hyperparathyroidism of renal origin: Secondary | ICD-10-CM | POA: Diagnosis not present

## 2015-09-10 DIAGNOSIS — N186 End stage renal disease: Secondary | ICD-10-CM | POA: Diagnosis not present

## 2015-09-12 DIAGNOSIS — D631 Anemia in chronic kidney disease: Secondary | ICD-10-CM | POA: Diagnosis not present

## 2015-09-12 DIAGNOSIS — N186 End stage renal disease: Secondary | ICD-10-CM | POA: Diagnosis not present

## 2015-09-12 DIAGNOSIS — N2581 Secondary hyperparathyroidism of renal origin: Secondary | ICD-10-CM | POA: Diagnosis not present

## 2015-09-14 DIAGNOSIS — D631 Anemia in chronic kidney disease: Secondary | ICD-10-CM | POA: Diagnosis not present

## 2015-09-14 DIAGNOSIS — N2581 Secondary hyperparathyroidism of renal origin: Secondary | ICD-10-CM | POA: Diagnosis not present

## 2015-09-14 DIAGNOSIS — N186 End stage renal disease: Secondary | ICD-10-CM | POA: Diagnosis not present

## 2015-09-17 DIAGNOSIS — N2581 Secondary hyperparathyroidism of renal origin: Secondary | ICD-10-CM | POA: Diagnosis not present

## 2015-09-17 DIAGNOSIS — N186 End stage renal disease: Secondary | ICD-10-CM | POA: Diagnosis not present

## 2015-09-17 DIAGNOSIS — D631 Anemia in chronic kidney disease: Secondary | ICD-10-CM | POA: Diagnosis not present

## 2015-09-19 DIAGNOSIS — D631 Anemia in chronic kidney disease: Secondary | ICD-10-CM | POA: Diagnosis not present

## 2015-09-19 DIAGNOSIS — N2581 Secondary hyperparathyroidism of renal origin: Secondary | ICD-10-CM | POA: Diagnosis not present

## 2015-09-19 DIAGNOSIS — N186 End stage renal disease: Secondary | ICD-10-CM | POA: Diagnosis not present

## 2015-09-21 DIAGNOSIS — D631 Anemia in chronic kidney disease: Secondary | ICD-10-CM | POA: Diagnosis not present

## 2015-09-21 DIAGNOSIS — N2581 Secondary hyperparathyroidism of renal origin: Secondary | ICD-10-CM | POA: Diagnosis not present

## 2015-09-21 DIAGNOSIS — N186 End stage renal disease: Secondary | ICD-10-CM | POA: Diagnosis not present

## 2015-09-24 DIAGNOSIS — D631 Anemia in chronic kidney disease: Secondary | ICD-10-CM | POA: Diagnosis not present

## 2015-09-24 DIAGNOSIS — N186 End stage renal disease: Secondary | ICD-10-CM | POA: Diagnosis not present

## 2015-09-24 DIAGNOSIS — N2581 Secondary hyperparathyroidism of renal origin: Secondary | ICD-10-CM | POA: Diagnosis not present

## 2015-09-26 DIAGNOSIS — D631 Anemia in chronic kidney disease: Secondary | ICD-10-CM | POA: Diagnosis not present

## 2015-09-26 DIAGNOSIS — N186 End stage renal disease: Secondary | ICD-10-CM | POA: Diagnosis not present

## 2015-09-26 DIAGNOSIS — N2581 Secondary hyperparathyroidism of renal origin: Secondary | ICD-10-CM | POA: Diagnosis not present

## 2015-09-28 DIAGNOSIS — D631 Anemia in chronic kidney disease: Secondary | ICD-10-CM | POA: Diagnosis not present

## 2015-09-28 DIAGNOSIS — N186 End stage renal disease: Secondary | ICD-10-CM | POA: Diagnosis not present

## 2015-09-28 DIAGNOSIS — N2581 Secondary hyperparathyroidism of renal origin: Secondary | ICD-10-CM | POA: Diagnosis not present

## 2015-10-01 DIAGNOSIS — N186 End stage renal disease: Secondary | ICD-10-CM | POA: Diagnosis not present

## 2015-10-01 DIAGNOSIS — N2581 Secondary hyperparathyroidism of renal origin: Secondary | ICD-10-CM | POA: Diagnosis not present

## 2015-10-01 DIAGNOSIS — D631 Anemia in chronic kidney disease: Secondary | ICD-10-CM | POA: Diagnosis not present

## 2015-10-03 DIAGNOSIS — N186 End stage renal disease: Secondary | ICD-10-CM | POA: Diagnosis not present

## 2015-10-03 DIAGNOSIS — D631 Anemia in chronic kidney disease: Secondary | ICD-10-CM | POA: Diagnosis not present

## 2015-10-03 DIAGNOSIS — N2581 Secondary hyperparathyroidism of renal origin: Secondary | ICD-10-CM | POA: Diagnosis not present

## 2015-10-03 DIAGNOSIS — Z992 Dependence on renal dialysis: Secondary | ICD-10-CM | POA: Diagnosis not present

## 2015-10-03 DIAGNOSIS — I120 Hypertensive chronic kidney disease with stage 5 chronic kidney disease or end stage renal disease: Secondary | ICD-10-CM | POA: Diagnosis not present

## 2015-10-05 DIAGNOSIS — D631 Anemia in chronic kidney disease: Secondary | ICD-10-CM | POA: Diagnosis not present

## 2015-10-05 DIAGNOSIS — N186 End stage renal disease: Secondary | ICD-10-CM | POA: Diagnosis not present

## 2015-10-05 DIAGNOSIS — N2581 Secondary hyperparathyroidism of renal origin: Secondary | ICD-10-CM | POA: Diagnosis not present

## 2015-10-08 DIAGNOSIS — D631 Anemia in chronic kidney disease: Secondary | ICD-10-CM | POA: Diagnosis not present

## 2015-10-08 DIAGNOSIS — N186 End stage renal disease: Secondary | ICD-10-CM | POA: Diagnosis not present

## 2015-10-08 DIAGNOSIS — N2581 Secondary hyperparathyroidism of renal origin: Secondary | ICD-10-CM | POA: Diagnosis not present

## 2015-10-10 DIAGNOSIS — D631 Anemia in chronic kidney disease: Secondary | ICD-10-CM | POA: Diagnosis not present

## 2015-10-10 DIAGNOSIS — N2581 Secondary hyperparathyroidism of renal origin: Secondary | ICD-10-CM | POA: Diagnosis not present

## 2015-10-10 DIAGNOSIS — N186 End stage renal disease: Secondary | ICD-10-CM | POA: Diagnosis not present

## 2015-10-12 DIAGNOSIS — N2581 Secondary hyperparathyroidism of renal origin: Secondary | ICD-10-CM | POA: Diagnosis not present

## 2015-10-12 DIAGNOSIS — D631 Anemia in chronic kidney disease: Secondary | ICD-10-CM | POA: Diagnosis not present

## 2015-10-12 DIAGNOSIS — N186 End stage renal disease: Secondary | ICD-10-CM | POA: Diagnosis not present

## 2015-10-15 DIAGNOSIS — N186 End stage renal disease: Secondary | ICD-10-CM | POA: Diagnosis not present

## 2015-10-15 DIAGNOSIS — N2581 Secondary hyperparathyroidism of renal origin: Secondary | ICD-10-CM | POA: Diagnosis not present

## 2015-10-15 DIAGNOSIS — D631 Anemia in chronic kidney disease: Secondary | ICD-10-CM | POA: Diagnosis not present

## 2015-10-17 DIAGNOSIS — D631 Anemia in chronic kidney disease: Secondary | ICD-10-CM | POA: Diagnosis not present

## 2015-10-17 DIAGNOSIS — N186 End stage renal disease: Secondary | ICD-10-CM | POA: Diagnosis not present

## 2015-10-17 DIAGNOSIS — N2581 Secondary hyperparathyroidism of renal origin: Secondary | ICD-10-CM | POA: Diagnosis not present

## 2015-10-19 DIAGNOSIS — N186 End stage renal disease: Secondary | ICD-10-CM | POA: Diagnosis not present

## 2015-10-19 DIAGNOSIS — N2581 Secondary hyperparathyroidism of renal origin: Secondary | ICD-10-CM | POA: Diagnosis not present

## 2015-10-19 DIAGNOSIS — D631 Anemia in chronic kidney disease: Secondary | ICD-10-CM | POA: Diagnosis not present

## 2015-10-22 DIAGNOSIS — N2581 Secondary hyperparathyroidism of renal origin: Secondary | ICD-10-CM | POA: Diagnosis not present

## 2015-10-22 DIAGNOSIS — D631 Anemia in chronic kidney disease: Secondary | ICD-10-CM | POA: Diagnosis not present

## 2015-10-22 DIAGNOSIS — N186 End stage renal disease: Secondary | ICD-10-CM | POA: Diagnosis not present

## 2015-10-24 DIAGNOSIS — N186 End stage renal disease: Secondary | ICD-10-CM | POA: Diagnosis not present

## 2015-10-24 DIAGNOSIS — D631 Anemia in chronic kidney disease: Secondary | ICD-10-CM | POA: Diagnosis not present

## 2015-10-24 DIAGNOSIS — N2581 Secondary hyperparathyroidism of renal origin: Secondary | ICD-10-CM | POA: Diagnosis not present

## 2015-10-26 DIAGNOSIS — N2581 Secondary hyperparathyroidism of renal origin: Secondary | ICD-10-CM | POA: Diagnosis not present

## 2015-10-26 DIAGNOSIS — D631 Anemia in chronic kidney disease: Secondary | ICD-10-CM | POA: Diagnosis not present

## 2015-10-26 DIAGNOSIS — N186 End stage renal disease: Secondary | ICD-10-CM | POA: Diagnosis not present

## 2015-10-29 DIAGNOSIS — D631 Anemia in chronic kidney disease: Secondary | ICD-10-CM | POA: Diagnosis not present

## 2015-10-29 DIAGNOSIS — N2581 Secondary hyperparathyroidism of renal origin: Secondary | ICD-10-CM | POA: Diagnosis not present

## 2015-10-29 DIAGNOSIS — N186 End stage renal disease: Secondary | ICD-10-CM | POA: Diagnosis not present

## 2015-10-30 DIAGNOSIS — Z01812 Encounter for preprocedural laboratory examination: Secondary | ICD-10-CM | POA: Diagnosis not present

## 2015-10-31 DIAGNOSIS — N2581 Secondary hyperparathyroidism of renal origin: Secondary | ICD-10-CM | POA: Diagnosis not present

## 2015-10-31 DIAGNOSIS — N186 End stage renal disease: Secondary | ICD-10-CM | POA: Diagnosis not present

## 2015-10-31 DIAGNOSIS — D631 Anemia in chronic kidney disease: Secondary | ICD-10-CM | POA: Diagnosis not present

## 2015-11-02 DIAGNOSIS — I120 Hypertensive chronic kidney disease with stage 5 chronic kidney disease or end stage renal disease: Secondary | ICD-10-CM | POA: Diagnosis not present

## 2015-11-02 DIAGNOSIS — N186 End stage renal disease: Secondary | ICD-10-CM | POA: Diagnosis not present

## 2015-11-02 DIAGNOSIS — D631 Anemia in chronic kidney disease: Secondary | ICD-10-CM | POA: Diagnosis not present

## 2015-11-02 DIAGNOSIS — N2581 Secondary hyperparathyroidism of renal origin: Secondary | ICD-10-CM | POA: Diagnosis not present

## 2015-11-02 DIAGNOSIS — Z992 Dependence on renal dialysis: Secondary | ICD-10-CM | POA: Diagnosis not present

## 2015-11-05 ENCOUNTER — Other Ambulatory Visit: Payer: Self-pay

## 2015-11-05 DIAGNOSIS — N2581 Secondary hyperparathyroidism of renal origin: Secondary | ICD-10-CM | POA: Diagnosis not present

## 2015-11-05 DIAGNOSIS — N186 End stage renal disease: Secondary | ICD-10-CM | POA: Diagnosis not present

## 2015-11-05 DIAGNOSIS — D631 Anemia in chronic kidney disease: Secondary | ICD-10-CM | POA: Diagnosis not present

## 2015-11-07 ENCOUNTER — Other Ambulatory Visit: Payer: Medicare Other

## 2015-11-07 DIAGNOSIS — N2581 Secondary hyperparathyroidism of renal origin: Secondary | ICD-10-CM | POA: Diagnosis not present

## 2015-11-07 DIAGNOSIS — N186 End stage renal disease: Secondary | ICD-10-CM | POA: Diagnosis not present

## 2015-11-07 DIAGNOSIS — D631 Anemia in chronic kidney disease: Secondary | ICD-10-CM | POA: Diagnosis not present

## 2015-11-08 ENCOUNTER — Inpatient Hospital Stay: Admission: RE | Admit: 2015-11-08 | Payer: Medicare Other | Source: Ambulatory Visit

## 2015-11-09 ENCOUNTER — Encounter: Payer: Self-pay | Admitting: Vascular Surgery

## 2015-11-09 ENCOUNTER — Telehealth: Payer: Self-pay

## 2015-11-09 ENCOUNTER — Other Ambulatory Visit: Payer: Self-pay

## 2015-11-09 DIAGNOSIS — N2581 Secondary hyperparathyroidism of renal origin: Secondary | ICD-10-CM | POA: Diagnosis not present

## 2015-11-09 DIAGNOSIS — D631 Anemia in chronic kidney disease: Secondary | ICD-10-CM | POA: Diagnosis not present

## 2015-11-09 DIAGNOSIS — N186 End stage renal disease: Secondary | ICD-10-CM | POA: Diagnosis not present

## 2015-11-09 NOTE — Telephone Encounter (Signed)
Resched cta for 7/11 at 4:30 at [place]. Lm on hm# to inform pt of appt.

## 2015-11-09 NOTE — Telephone Encounter (Signed)
[place] on 11/07/15 re: need for approval from the Nephrologist for pt. To receive contrast dye with the CTA Abd/ Pelvis in follow-up to [facility] Excluder Stent Graft Rep. Of Bilat. Common Iliac Artery Aneurysms. rec'd a phone call back this AM from [place], stating "the charge nurse spoke with an MD, and it is approved for the pt. To have the CTA Abd/ Pelvis with contrast.  Will reschedule pt's CTA.

## 2015-11-09 NOTE — Telephone Encounter (Signed)
-----   Message from Elam Dutch, MD sent at 11/05/2015  5:52 PM EDT ----- Regarding: RE: CTA Abd/ Pelvis sched. on HD pt. yes ----- Message -----    From: Denman George, RN    Sent: 11/05/2015   3:58 PM      To: Elam Dutch, MD Subject: CTA Abd/ Pelvis sched. on HD pt.               You follow this pt. for Aortic and Iliac Aneurysms; he is scheduled 7/6 for CTA Abd/ Pelvis.  [facility] is calling to check on giving IV contrast to this dialysis pt.  Do I need to check with Nephrologist?   (He did have CTA Abd/ Pelvis in 04/2015, and had already started HD)

## 2015-11-12 DIAGNOSIS — N2581 Secondary hyperparathyroidism of renal origin: Secondary | ICD-10-CM | POA: Diagnosis not present

## 2015-11-12 DIAGNOSIS — N186 End stage renal disease: Secondary | ICD-10-CM | POA: Diagnosis not present

## 2015-11-12 DIAGNOSIS — D631 Anemia in chronic kidney disease: Secondary | ICD-10-CM | POA: Diagnosis not present

## 2015-11-13 ENCOUNTER — Other Ambulatory Visit: Payer: Medicare Other

## 2015-11-14 DIAGNOSIS — N2581 Secondary hyperparathyroidism of renal origin: Secondary | ICD-10-CM | POA: Diagnosis not present

## 2015-11-14 DIAGNOSIS — D631 Anemia in chronic kidney disease: Secondary | ICD-10-CM | POA: Diagnosis not present

## 2015-11-14 DIAGNOSIS — N186 End stage renal disease: Secondary | ICD-10-CM | POA: Diagnosis not present

## 2015-11-15 ENCOUNTER — Ambulatory Visit: Payer: Medicare Other | Admitting: Vascular Surgery

## 2015-11-16 DIAGNOSIS — N186 End stage renal disease: Secondary | ICD-10-CM | POA: Diagnosis not present

## 2015-11-16 DIAGNOSIS — D631 Anemia in chronic kidney disease: Secondary | ICD-10-CM | POA: Diagnosis not present

## 2015-11-16 DIAGNOSIS — N2581 Secondary hyperparathyroidism of renal origin: Secondary | ICD-10-CM | POA: Diagnosis not present

## 2015-11-19 DIAGNOSIS — D631 Anemia in chronic kidney disease: Secondary | ICD-10-CM | POA: Diagnosis not present

## 2015-11-19 DIAGNOSIS — N2581 Secondary hyperparathyroidism of renal origin: Secondary | ICD-10-CM | POA: Diagnosis not present

## 2015-11-19 DIAGNOSIS — N186 End stage renal disease: Secondary | ICD-10-CM | POA: Diagnosis not present

## 2015-11-21 DIAGNOSIS — N2581 Secondary hyperparathyroidism of renal origin: Secondary | ICD-10-CM | POA: Diagnosis not present

## 2015-11-21 DIAGNOSIS — N186 End stage renal disease: Secondary | ICD-10-CM | POA: Diagnosis not present

## 2015-11-21 DIAGNOSIS — D631 Anemia in chronic kidney disease: Secondary | ICD-10-CM | POA: Diagnosis not present

## 2015-11-23 DIAGNOSIS — N2581 Secondary hyperparathyroidism of renal origin: Secondary | ICD-10-CM | POA: Diagnosis not present

## 2015-11-23 DIAGNOSIS — N186 End stage renal disease: Secondary | ICD-10-CM | POA: Diagnosis not present

## 2015-11-23 DIAGNOSIS — D631 Anemia in chronic kidney disease: Secondary | ICD-10-CM | POA: Diagnosis not present

## 2015-11-26 DIAGNOSIS — N2581 Secondary hyperparathyroidism of renal origin: Secondary | ICD-10-CM | POA: Diagnosis not present

## 2015-11-26 DIAGNOSIS — D631 Anemia in chronic kidney disease: Secondary | ICD-10-CM | POA: Diagnosis not present

## 2015-11-26 DIAGNOSIS — N186 End stage renal disease: Secondary | ICD-10-CM | POA: Diagnosis not present

## 2015-11-28 DIAGNOSIS — N2581 Secondary hyperparathyroidism of renal origin: Secondary | ICD-10-CM | POA: Diagnosis not present

## 2015-11-28 DIAGNOSIS — D631 Anemia in chronic kidney disease: Secondary | ICD-10-CM | POA: Diagnosis not present

## 2015-11-28 DIAGNOSIS — N186 End stage renal disease: Secondary | ICD-10-CM | POA: Diagnosis not present

## 2015-11-30 DIAGNOSIS — N2581 Secondary hyperparathyroidism of renal origin: Secondary | ICD-10-CM | POA: Diagnosis not present

## 2015-11-30 DIAGNOSIS — N186 End stage renal disease: Secondary | ICD-10-CM | POA: Diagnosis not present

## 2015-11-30 DIAGNOSIS — D631 Anemia in chronic kidney disease: Secondary | ICD-10-CM | POA: Diagnosis not present

## 2015-12-03 DIAGNOSIS — I120 Hypertensive chronic kidney disease with stage 5 chronic kidney disease or end stage renal disease: Secondary | ICD-10-CM | POA: Diagnosis not present

## 2015-12-03 DIAGNOSIS — N186 End stage renal disease: Secondary | ICD-10-CM | POA: Diagnosis not present

## 2015-12-03 DIAGNOSIS — D631 Anemia in chronic kidney disease: Secondary | ICD-10-CM | POA: Diagnosis not present

## 2015-12-03 DIAGNOSIS — N2581 Secondary hyperparathyroidism of renal origin: Secondary | ICD-10-CM | POA: Diagnosis not present

## 2015-12-03 DIAGNOSIS — Z992 Dependence on renal dialysis: Secondary | ICD-10-CM | POA: Diagnosis not present

## 2015-12-04 ENCOUNTER — Ambulatory Visit
Admission: RE | Admit: 2015-12-04 | Discharge: 2015-12-04 | Disposition: A | Discharge status: Home / Self Care | Payer: Medicare Other | Source: Ambulatory Visit | Attending: Vascular Surgery | Admitting: Vascular Surgery

## 2015-12-04 DIAGNOSIS — I723 Aneurysm of iliac artery: Secondary | ICD-10-CM

## 2015-12-04 MED ORDER — IOPAMIDOL (ISOVUE-370) INJECTION 76%
100.0000 mL | Freq: Once | INTRAVENOUS | Status: AC | PRN
Start: 2015-12-04 — End: 2015-12-04
  Administered 2015-12-04: 100 mL via INTRAVENOUS

## 2015-12-05 DIAGNOSIS — N186 End stage renal disease: Secondary | ICD-10-CM | POA: Diagnosis not present

## 2015-12-05 DIAGNOSIS — D631 Anemia in chronic kidney disease: Secondary | ICD-10-CM | POA: Diagnosis not present

## 2015-12-05 DIAGNOSIS — N2581 Secondary hyperparathyroidism of renal origin: Secondary | ICD-10-CM | POA: Diagnosis not present

## 2015-12-06 ENCOUNTER — Encounter: Payer: Self-pay | Admitting: Vascular Surgery

## 2015-12-06 DIAGNOSIS — Z79891 Long term (current) use of opiate analgesic: Secondary | ICD-10-CM | POA: Diagnosis not present

## 2015-12-06 DIAGNOSIS — G894 Chronic pain syndrome: Secondary | ICD-10-CM | POA: Diagnosis not present

## 2015-12-06 DIAGNOSIS — M5136 Other intervertebral disc degeneration, lumbar region: Secondary | ICD-10-CM | POA: Diagnosis not present

## 2015-12-07 DIAGNOSIS — D631 Anemia in chronic kidney disease: Secondary | ICD-10-CM | POA: Diagnosis not present

## 2015-12-07 DIAGNOSIS — N2581 Secondary hyperparathyroidism of renal origin: Secondary | ICD-10-CM | POA: Diagnosis not present

## 2015-12-07 DIAGNOSIS — N186 End stage renal disease: Secondary | ICD-10-CM | POA: Diagnosis not present

## 2015-12-10 DIAGNOSIS — D631 Anemia in chronic kidney disease: Secondary | ICD-10-CM | POA: Diagnosis not present

## 2015-12-10 DIAGNOSIS — N186 End stage renal disease: Secondary | ICD-10-CM | POA: Diagnosis not present

## 2015-12-10 DIAGNOSIS — N2581 Secondary hyperparathyroidism of renal origin: Secondary | ICD-10-CM | POA: Diagnosis not present

## 2015-12-12 DIAGNOSIS — N186 End stage renal disease: Secondary | ICD-10-CM | POA: Diagnosis not present

## 2015-12-12 DIAGNOSIS — N2581 Secondary hyperparathyroidism of renal origin: Secondary | ICD-10-CM | POA: Diagnosis not present

## 2015-12-12 DIAGNOSIS — D631 Anemia in chronic kidney disease: Secondary | ICD-10-CM | POA: Diagnosis not present

## 2015-12-13 ENCOUNTER — Ambulatory Visit (INDEPENDENT_AMBULATORY_CARE_PROVIDER_SITE_OTHER): Payer: Medicare Other | Admitting: Vascular Surgery

## 2015-12-13 ENCOUNTER — Encounter: Payer: Self-pay | Admitting: Vascular Surgery

## 2015-12-13 VITALS — BP 94/66 | HR 82 | Ht 73.0 in | Wt 184.0 lb

## 2015-12-13 DIAGNOSIS — I728 Aneurysm of other specified arteries: Secondary | ICD-10-CM | POA: Diagnosis not present

## 2015-12-13 DIAGNOSIS — I723 Aneurysm of iliac artery: Secondary | ICD-10-CM | POA: Diagnosis not present

## 2015-12-13 NOTE — Progress Notes (Signed)
Patient is a 65 year old male who returns for follow-up today after plication of a right upper arm AV fistula. He has also previously had aortic and iliac artery stent graft repair. 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 He denies any abdominal or back pain.  They are successfully cannulating the fistula for dialysis. Chronic medical problems remain hypertension end-stage renal disease chronic back pain and congestive heart failure all of which are currently been stable. He is followed by Dr. Nelva Bush for his back pain.        Past Medical History  Diagnosis Date  . Hypertension    . CHF (congestive heart failure) ([place])    . Chronic back pain        "mostly lower" (11/08/2014)  . Heart failure with reduced ejection fraction ([place])    . Gout        "hands" (11/08/2014)  . GERD (gastroesophageal reflux disease)        "sometimes" (11/08/2014)  . Arthritis        "hands; basically all my joints" (11/08/2014)  . End stage renal disease ([place])        pt does not urinate; pt. states that he does dialysis on MWF; [place]" (11/08/2014)  . Anemia in chronic kidney disease            Past Surgical History  Procedure Laterality Date  . Av fistula placement Right 07/26/2009  . Ligation of arteriovenous  fistula Right 02/22/2013      Procedure: PLICATION OF ARTERIOVENOUS  FISTULA;  Surgeon: Elam Dutch, MD;  Location: [facility];  Service: Vascular;  Laterality: Right;  . Insertion of dialysis catheter N/A 02/22/2013      Procedure: INSERTION OF DIALYSIS CATHETER;  Surgeon: Elam Dutch, MD;  Location: [facility] OR;  Service: Vascular;  Laterality: N/A;  . Embolization Left 11/08/2014      internal iliac artery; [place] Excluder bifurcated stent graft for repair of common iliac aneurysm  . Endovascular stent insertion Bilateral 11/08/2014      Procedure: REPAIR OF BILATERAL ILIAC ARTERY  ANEURYSM WITH  Bifurcated stent, with coiling left internal artery;  Surgeon: Elam Dutch, MD;  Location: [facility] OR;  Service: Vascular;  Laterality: Bilateral;  . Revison of arteriovenous fistula Right 04/24/2015      Procedure: PLICATION OF RIGHT ARM  ARTERIOVENOUS FISTULA;  Surgeon: Elam Dutch, MD;  Location: [facility] OR;  Service: Vascular;  Laterality: Right;      Physical exam:   Vitals:   12/13/15 1034  BP: 94/66  Pulse: 82  SpO2: 96%  Weight: 184 lb (83.5 kg)  Height: 6\' 1"  (1.854 m)    Right upper extremity: Palpable thrill in fistula well-healed incision    Abdomen: Soft non-tender 2+ femoral pulses    Data: The patient recently had a CT scan of abdomen and pelvis. This showed 19 mm aneurysms of the superior mesenteric artery celiac artery and splenic artery all of which were stable. His aneurysm stent graft is well positioned just below the renal arteries. The left internal iliac arteries well excluded with coils. The left common iliac artery aneurysm is excluded with the stent extending into the left external iliac artery. The left common iliac artery aneurysm is 3.6 cm in diameter. Aortic diameter was 2.3 cm , right internal iliac 1.6 cm right common iliac 3.1 cm    Assessment:  Doing well status post right arm plication of fistula. His iliac aneurysms are well excluded. We will need to do continued surveillance of the mesenteric aneurysms as well as the right common iliac artery.    Plan: He'll have a follow-up US in 6 months. From a vascular standpoint he should be okay for kidney transplantation if he is deemed a candidate by the transplant service.    [name], MD [facility] Office: [phone] Pager: [phone]

## 2015-12-14 DIAGNOSIS — N2581 Secondary hyperparathyroidism of renal origin: Secondary | ICD-10-CM | POA: Diagnosis not present

## 2015-12-14 DIAGNOSIS — D631 Anemia in chronic kidney disease: Secondary | ICD-10-CM | POA: Diagnosis not present

## 2015-12-14 DIAGNOSIS — N186 End stage renal disease: Secondary | ICD-10-CM | POA: Diagnosis not present

## 2015-12-17 DIAGNOSIS — N186 End stage renal disease: Secondary | ICD-10-CM | POA: Diagnosis not present

## 2015-12-17 DIAGNOSIS — N2581 Secondary hyperparathyroidism of renal origin: Secondary | ICD-10-CM | POA: Diagnosis not present

## 2015-12-17 DIAGNOSIS — D631 Anemia in chronic kidney disease: Secondary | ICD-10-CM | POA: Diagnosis not present

## 2015-12-19 DIAGNOSIS — N2581 Secondary hyperparathyroidism of renal origin: Secondary | ICD-10-CM | POA: Diagnosis not present

## 2015-12-19 DIAGNOSIS — N186 End stage renal disease: Secondary | ICD-10-CM | POA: Diagnosis not present

## 2015-12-19 DIAGNOSIS — D631 Anemia in chronic kidney disease: Secondary | ICD-10-CM | POA: Diagnosis not present

## 2015-12-21 DIAGNOSIS — N2581 Secondary hyperparathyroidism of renal origin: Secondary | ICD-10-CM | POA: Diagnosis not present

## 2015-12-21 DIAGNOSIS — N186 End stage renal disease: Secondary | ICD-10-CM | POA: Diagnosis not present

## 2015-12-21 DIAGNOSIS — D631 Anemia in chronic kidney disease: Secondary | ICD-10-CM | POA: Diagnosis not present

## 2015-12-24 DIAGNOSIS — N2581 Secondary hyperparathyroidism of renal origin: Secondary | ICD-10-CM | POA: Diagnosis not present

## 2015-12-24 DIAGNOSIS — D631 Anemia in chronic kidney disease: Secondary | ICD-10-CM | POA: Diagnosis not present

## 2015-12-24 DIAGNOSIS — N186 End stage renal disease: Secondary | ICD-10-CM | POA: Diagnosis not present

## 2015-12-26 DIAGNOSIS — N186 End stage renal disease: Secondary | ICD-10-CM | POA: Diagnosis not present

## 2015-12-26 DIAGNOSIS — D631 Anemia in chronic kidney disease: Secondary | ICD-10-CM | POA: Diagnosis not present

## 2015-12-26 DIAGNOSIS — N2581 Secondary hyperparathyroidism of renal origin: Secondary | ICD-10-CM | POA: Diagnosis not present

## 2015-12-28 DIAGNOSIS — N2581 Secondary hyperparathyroidism of renal origin: Secondary | ICD-10-CM | POA: Diagnosis not present

## 2015-12-28 DIAGNOSIS — N186 End stage renal disease: Secondary | ICD-10-CM | POA: Diagnosis not present

## 2015-12-28 DIAGNOSIS — D631 Anemia in chronic kidney disease: Secondary | ICD-10-CM | POA: Diagnosis not present

## 2015-12-31 DIAGNOSIS — D631 Anemia in chronic kidney disease: Secondary | ICD-10-CM | POA: Diagnosis not present

## 2015-12-31 DIAGNOSIS — N2581 Secondary hyperparathyroidism of renal origin: Secondary | ICD-10-CM | POA: Diagnosis not present

## 2015-12-31 DIAGNOSIS — N186 End stage renal disease: Secondary | ICD-10-CM | POA: Diagnosis not present

## 2016-01-02 DIAGNOSIS — N186 End stage renal disease: Secondary | ICD-10-CM | POA: Diagnosis not present

## 2016-01-02 DIAGNOSIS — D631 Anemia in chronic kidney disease: Secondary | ICD-10-CM | POA: Diagnosis not present

## 2016-01-02 DIAGNOSIS — N2581 Secondary hyperparathyroidism of renal origin: Secondary | ICD-10-CM | POA: Diagnosis not present

## 2016-01-03 DIAGNOSIS — I120 Hypertensive chronic kidney disease with stage 5 chronic kidney disease or end stage renal disease: Secondary | ICD-10-CM | POA: Diagnosis not present

## 2016-01-03 DIAGNOSIS — N186 End stage renal disease: Secondary | ICD-10-CM | POA: Diagnosis not present

## 2016-01-03 DIAGNOSIS — Z992 Dependence on renal dialysis: Secondary | ICD-10-CM | POA: Diagnosis not present

## 2016-01-04 DIAGNOSIS — N186 End stage renal disease: Secondary | ICD-10-CM | POA: Diagnosis not present

## 2016-01-04 DIAGNOSIS — N2581 Secondary hyperparathyroidism of renal origin: Secondary | ICD-10-CM | POA: Diagnosis not present

## 2016-01-04 DIAGNOSIS — Z23 Encounter for immunization: Secondary | ICD-10-CM | POA: Diagnosis not present

## 2016-01-04 DIAGNOSIS — D631 Anemia in chronic kidney disease: Secondary | ICD-10-CM | POA: Diagnosis not present

## 2016-01-07 DIAGNOSIS — D631 Anemia in chronic kidney disease: Secondary | ICD-10-CM | POA: Diagnosis not present

## 2016-01-07 DIAGNOSIS — Z23 Encounter for immunization: Secondary | ICD-10-CM | POA: Diagnosis not present

## 2016-01-07 DIAGNOSIS — N186 End stage renal disease: Secondary | ICD-10-CM | POA: Diagnosis not present

## 2016-01-07 DIAGNOSIS — N2581 Secondary hyperparathyroidism of renal origin: Secondary | ICD-10-CM | POA: Diagnosis not present

## 2016-01-09 DIAGNOSIS — Z23 Encounter for immunization: Secondary | ICD-10-CM | POA: Diagnosis not present

## 2016-01-09 DIAGNOSIS — N186 End stage renal disease: Secondary | ICD-10-CM | POA: Diagnosis not present

## 2016-01-09 DIAGNOSIS — N2581 Secondary hyperparathyroidism of renal origin: Secondary | ICD-10-CM | POA: Diagnosis not present

## 2016-01-09 DIAGNOSIS — D631 Anemia in chronic kidney disease: Secondary | ICD-10-CM | POA: Diagnosis not present

## 2016-01-11 DIAGNOSIS — N2581 Secondary hyperparathyroidism of renal origin: Secondary | ICD-10-CM | POA: Diagnosis not present

## 2016-01-11 DIAGNOSIS — Z23 Encounter for immunization: Secondary | ICD-10-CM | POA: Diagnosis not present

## 2016-01-11 DIAGNOSIS — N186 End stage renal disease: Secondary | ICD-10-CM | POA: Diagnosis not present

## 2016-01-11 DIAGNOSIS — D631 Anemia in chronic kidney disease: Secondary | ICD-10-CM | POA: Diagnosis not present

## 2016-01-11 NOTE — Addendum Note (Signed)
Addended by: Kaleen Mask on: 01/11/2016 01:56 PM   Modules accepted: Orders

## 2016-01-14 DIAGNOSIS — Z23 Encounter for immunization: Secondary | ICD-10-CM | POA: Diagnosis not present

## 2016-01-14 DIAGNOSIS — D631 Anemia in chronic kidney disease: Secondary | ICD-10-CM | POA: Diagnosis not present

## 2016-01-14 DIAGNOSIS — N186 End stage renal disease: Secondary | ICD-10-CM | POA: Diagnosis not present

## 2016-01-14 DIAGNOSIS — N2581 Secondary hyperparathyroidism of renal origin: Secondary | ICD-10-CM | POA: Diagnosis not present

## 2016-01-16 DIAGNOSIS — D631 Anemia in chronic kidney disease: Secondary | ICD-10-CM | POA: Diagnosis not present

## 2016-01-16 DIAGNOSIS — N2581 Secondary hyperparathyroidism of renal origin: Secondary | ICD-10-CM | POA: Diagnosis not present

## 2016-01-16 DIAGNOSIS — N186 End stage renal disease: Secondary | ICD-10-CM | POA: Diagnosis not present

## 2016-01-16 DIAGNOSIS — Z23 Encounter for immunization: Secondary | ICD-10-CM | POA: Diagnosis not present

## 2016-01-18 DIAGNOSIS — N2581 Secondary hyperparathyroidism of renal origin: Secondary | ICD-10-CM | POA: Diagnosis not present

## 2016-01-18 DIAGNOSIS — N186 End stage renal disease: Secondary | ICD-10-CM | POA: Diagnosis not present

## 2016-01-18 DIAGNOSIS — Z23 Encounter for immunization: Secondary | ICD-10-CM | POA: Diagnosis not present

## 2016-01-18 DIAGNOSIS — D631 Anemia in chronic kidney disease: Secondary | ICD-10-CM | POA: Diagnosis not present

## 2016-01-21 DIAGNOSIS — N2581 Secondary hyperparathyroidism of renal origin: Secondary | ICD-10-CM | POA: Diagnosis not present

## 2016-01-21 DIAGNOSIS — D631 Anemia in chronic kidney disease: Secondary | ICD-10-CM | POA: Diagnosis not present

## 2016-01-21 DIAGNOSIS — N186 End stage renal disease: Secondary | ICD-10-CM | POA: Diagnosis not present

## 2016-01-21 DIAGNOSIS — Z23 Encounter for immunization: Secondary | ICD-10-CM | POA: Diagnosis not present

## 2016-01-23 DIAGNOSIS — D631 Anemia in chronic kidney disease: Secondary | ICD-10-CM | POA: Diagnosis not present

## 2016-01-23 DIAGNOSIS — N186 End stage renal disease: Secondary | ICD-10-CM | POA: Diagnosis not present

## 2016-01-23 DIAGNOSIS — N2581 Secondary hyperparathyroidism of renal origin: Secondary | ICD-10-CM | POA: Diagnosis not present

## 2016-01-23 DIAGNOSIS — Z23 Encounter for immunization: Secondary | ICD-10-CM | POA: Diagnosis not present

## 2016-01-25 DIAGNOSIS — N2581 Secondary hyperparathyroidism of renal origin: Secondary | ICD-10-CM | POA: Diagnosis not present

## 2016-01-25 DIAGNOSIS — D631 Anemia in chronic kidney disease: Secondary | ICD-10-CM | POA: Diagnosis not present

## 2016-01-25 DIAGNOSIS — N186 End stage renal disease: Secondary | ICD-10-CM | POA: Diagnosis not present

## 2016-01-25 DIAGNOSIS — Z23 Encounter for immunization: Secondary | ICD-10-CM | POA: Diagnosis not present

## 2016-01-28 DIAGNOSIS — N2581 Secondary hyperparathyroidism of renal origin: Secondary | ICD-10-CM | POA: Diagnosis not present

## 2016-01-28 DIAGNOSIS — N186 End stage renal disease: Secondary | ICD-10-CM | POA: Diagnosis not present

## 2016-01-28 DIAGNOSIS — D631 Anemia in chronic kidney disease: Secondary | ICD-10-CM | POA: Diagnosis not present

## 2016-01-28 DIAGNOSIS — Z23 Encounter for immunization: Secondary | ICD-10-CM | POA: Diagnosis not present

## 2016-01-30 DIAGNOSIS — N2581 Secondary hyperparathyroidism of renal origin: Secondary | ICD-10-CM | POA: Diagnosis not present

## 2016-01-30 DIAGNOSIS — Z23 Encounter for immunization: Secondary | ICD-10-CM | POA: Diagnosis not present

## 2016-01-30 DIAGNOSIS — N186 End stage renal disease: Secondary | ICD-10-CM | POA: Diagnosis not present

## 2016-01-30 DIAGNOSIS — D631 Anemia in chronic kidney disease: Secondary | ICD-10-CM | POA: Diagnosis not present

## 2016-02-01 DIAGNOSIS — N186 End stage renal disease: Secondary | ICD-10-CM | POA: Diagnosis not present

## 2016-02-01 DIAGNOSIS — N2581 Secondary hyperparathyroidism of renal origin: Secondary | ICD-10-CM | POA: Diagnosis not present

## 2016-02-01 DIAGNOSIS — D631 Anemia in chronic kidney disease: Secondary | ICD-10-CM | POA: Diagnosis not present

## 2016-02-01 DIAGNOSIS — Z23 Encounter for immunization: Secondary | ICD-10-CM | POA: Diagnosis not present

## 2016-02-02 DIAGNOSIS — Z992 Dependence on renal dialysis: Secondary | ICD-10-CM | POA: Diagnosis not present

## 2016-02-02 DIAGNOSIS — I120 Hypertensive chronic kidney disease with stage 5 chronic kidney disease or end stage renal disease: Secondary | ICD-10-CM | POA: Diagnosis not present

## 2016-02-02 DIAGNOSIS — N186 End stage renal disease: Secondary | ICD-10-CM | POA: Diagnosis not present

## 2016-02-04 DIAGNOSIS — N2581 Secondary hyperparathyroidism of renal origin: Secondary | ICD-10-CM | POA: Diagnosis not present

## 2016-02-04 DIAGNOSIS — N186 End stage renal disease: Secondary | ICD-10-CM | POA: Diagnosis not present

## 2016-02-04 DIAGNOSIS — D631 Anemia in chronic kidney disease: Secondary | ICD-10-CM | POA: Diagnosis not present

## 2016-02-06 DIAGNOSIS — N186 End stage renal disease: Secondary | ICD-10-CM | POA: Diagnosis not present

## 2016-02-06 DIAGNOSIS — D631 Anemia in chronic kidney disease: Secondary | ICD-10-CM | POA: Diagnosis not present

## 2016-02-06 DIAGNOSIS — N2581 Secondary hyperparathyroidism of renal origin: Secondary | ICD-10-CM | POA: Diagnosis not present

## 2016-02-08 DIAGNOSIS — D631 Anemia in chronic kidney disease: Secondary | ICD-10-CM | POA: Diagnosis not present

## 2016-02-08 DIAGNOSIS — N2581 Secondary hyperparathyroidism of renal origin: Secondary | ICD-10-CM | POA: Diagnosis not present

## 2016-02-08 DIAGNOSIS — N186 End stage renal disease: Secondary | ICD-10-CM | POA: Diagnosis not present

## 2016-02-11 DIAGNOSIS — N186 End stage renal disease: Secondary | ICD-10-CM | POA: Diagnosis not present

## 2016-02-11 DIAGNOSIS — N2581 Secondary hyperparathyroidism of renal origin: Secondary | ICD-10-CM | POA: Diagnosis not present

## 2016-02-11 DIAGNOSIS — D631 Anemia in chronic kidney disease: Secondary | ICD-10-CM | POA: Diagnosis not present

## 2016-02-13 DIAGNOSIS — N2581 Secondary hyperparathyroidism of renal origin: Secondary | ICD-10-CM | POA: Diagnosis not present

## 2016-02-13 DIAGNOSIS — D631 Anemia in chronic kidney disease: Secondary | ICD-10-CM | POA: Diagnosis not present

## 2016-02-13 DIAGNOSIS — N186 End stage renal disease: Secondary | ICD-10-CM | POA: Diagnosis not present

## 2016-02-15 DIAGNOSIS — D631 Anemia in chronic kidney disease: Secondary | ICD-10-CM | POA: Diagnosis not present

## 2016-02-15 DIAGNOSIS — N186 End stage renal disease: Secondary | ICD-10-CM | POA: Diagnosis not present

## 2016-02-15 DIAGNOSIS — N2581 Secondary hyperparathyroidism of renal origin: Secondary | ICD-10-CM | POA: Diagnosis not present

## 2016-02-18 DIAGNOSIS — N186 End stage renal disease: Secondary | ICD-10-CM | POA: Diagnosis not present

## 2016-02-18 DIAGNOSIS — D631 Anemia in chronic kidney disease: Secondary | ICD-10-CM | POA: Diagnosis not present

## 2016-02-18 DIAGNOSIS — N2581 Secondary hyperparathyroidism of renal origin: Secondary | ICD-10-CM | POA: Diagnosis not present

## 2016-02-20 DIAGNOSIS — N186 End stage renal disease: Secondary | ICD-10-CM | POA: Diagnosis not present

## 2016-02-20 DIAGNOSIS — N2581 Secondary hyperparathyroidism of renal origin: Secondary | ICD-10-CM | POA: Diagnosis not present

## 2016-02-20 DIAGNOSIS — D631 Anemia in chronic kidney disease: Secondary | ICD-10-CM | POA: Diagnosis not present

## 2016-02-22 DIAGNOSIS — N2581 Secondary hyperparathyroidism of renal origin: Secondary | ICD-10-CM | POA: Diagnosis not present

## 2016-02-22 DIAGNOSIS — N186 End stage renal disease: Secondary | ICD-10-CM | POA: Diagnosis not present

## 2016-02-22 DIAGNOSIS — D631 Anemia in chronic kidney disease: Secondary | ICD-10-CM | POA: Diagnosis not present

## 2016-02-25 ENCOUNTER — Telehealth: Payer: Self-pay | Admitting: Nurse Practitioner

## 2016-02-25 DIAGNOSIS — N186 End stage renal disease: Secondary | ICD-10-CM | POA: Diagnosis not present

## 2016-02-25 DIAGNOSIS — D631 Anemia in chronic kidney disease: Secondary | ICD-10-CM | POA: Diagnosis not present

## 2016-02-25 DIAGNOSIS — N2581 Secondary hyperparathyroidism of renal origin: Secondary | ICD-10-CM | POA: Diagnosis not present

## 2016-02-25 NOTE — Telephone Encounter (Signed)
pt wasn't available. left msg w/ mrs to schedule AWV. she said she will have him call back. if pt calls back, please schedule AWV at 10:00 on 10/31 w/ Alisa, then EV w/ Jessica at 10:45 on 10/31 if still available. VDM (dee-dee)

## 2016-02-27 DIAGNOSIS — D631 Anemia in chronic kidney disease: Secondary | ICD-10-CM | POA: Diagnosis not present

## 2016-02-27 DIAGNOSIS — N186 End stage renal disease: Secondary | ICD-10-CM | POA: Diagnosis not present

## 2016-02-27 DIAGNOSIS — N2581 Secondary hyperparathyroidism of renal origin: Secondary | ICD-10-CM | POA: Diagnosis not present

## 2016-02-28 DIAGNOSIS — G894 Chronic pain syndrome: Secondary | ICD-10-CM | POA: Diagnosis not present

## 2016-02-28 DIAGNOSIS — M5136 Other intervertebral disc degeneration, lumbar region: Secondary | ICD-10-CM | POA: Diagnosis not present

## 2016-02-28 DIAGNOSIS — Z79891 Long term (current) use of opiate analgesic: Secondary | ICD-10-CM | POA: Diagnosis not present

## 2016-02-29 DIAGNOSIS — N2581 Secondary hyperparathyroidism of renal origin: Secondary | ICD-10-CM | POA: Diagnosis not present

## 2016-02-29 DIAGNOSIS — N186 End stage renal disease: Secondary | ICD-10-CM | POA: Diagnosis not present

## 2016-02-29 DIAGNOSIS — D631 Anemia in chronic kidney disease: Secondary | ICD-10-CM | POA: Diagnosis not present

## 2016-03-03 DIAGNOSIS — D631 Anemia in chronic kidney disease: Secondary | ICD-10-CM | POA: Diagnosis not present

## 2016-03-03 DIAGNOSIS — N2581 Secondary hyperparathyroidism of renal origin: Secondary | ICD-10-CM | POA: Diagnosis not present

## 2016-03-03 DIAGNOSIS — N186 End stage renal disease: Secondary | ICD-10-CM | POA: Diagnosis not present

## 2016-03-04 DIAGNOSIS — I120 Hypertensive chronic kidney disease with stage 5 chronic kidney disease or end stage renal disease: Secondary | ICD-10-CM | POA: Diagnosis not present

## 2016-03-04 DIAGNOSIS — N186 End stage renal disease: Secondary | ICD-10-CM | POA: Diagnosis not present

## 2016-03-04 DIAGNOSIS — Z992 Dependence on renal dialysis: Secondary | ICD-10-CM | POA: Diagnosis not present

## 2016-03-05 DIAGNOSIS — N2581 Secondary hyperparathyroidism of renal origin: Secondary | ICD-10-CM | POA: Diagnosis not present

## 2016-03-05 DIAGNOSIS — D631 Anemia in chronic kidney disease: Secondary | ICD-10-CM | POA: Diagnosis not present

## 2016-03-05 DIAGNOSIS — N186 End stage renal disease: Secondary | ICD-10-CM | POA: Diagnosis not present

## 2016-03-07 DIAGNOSIS — N186 End stage renal disease: Secondary | ICD-10-CM | POA: Diagnosis not present

## 2016-03-07 DIAGNOSIS — D631 Anemia in chronic kidney disease: Secondary | ICD-10-CM | POA: Diagnosis not present

## 2016-03-07 DIAGNOSIS — N2581 Secondary hyperparathyroidism of renal origin: Secondary | ICD-10-CM | POA: Diagnosis not present

## 2016-03-11 DIAGNOSIS — D631 Anemia in chronic kidney disease: Secondary | ICD-10-CM | POA: Diagnosis not present

## 2016-03-11 DIAGNOSIS — N2581 Secondary hyperparathyroidism of renal origin: Secondary | ICD-10-CM | POA: Diagnosis not present

## 2016-03-11 DIAGNOSIS — N186 End stage renal disease: Secondary | ICD-10-CM | POA: Diagnosis not present

## 2016-03-12 DIAGNOSIS — N2581 Secondary hyperparathyroidism of renal origin: Secondary | ICD-10-CM | POA: Diagnosis not present

## 2016-03-12 DIAGNOSIS — D631 Anemia in chronic kidney disease: Secondary | ICD-10-CM | POA: Diagnosis not present

## 2016-03-12 DIAGNOSIS — N186 End stage renal disease: Secondary | ICD-10-CM | POA: Diagnosis not present

## 2016-03-14 DIAGNOSIS — N2581 Secondary hyperparathyroidism of renal origin: Secondary | ICD-10-CM | POA: Diagnosis not present

## 2016-03-14 DIAGNOSIS — D631 Anemia in chronic kidney disease: Secondary | ICD-10-CM | POA: Diagnosis not present

## 2016-03-14 DIAGNOSIS — N186 End stage renal disease: Secondary | ICD-10-CM | POA: Diagnosis not present

## 2016-03-17 DIAGNOSIS — D631 Anemia in chronic kidney disease: Secondary | ICD-10-CM | POA: Diagnosis not present

## 2016-03-17 DIAGNOSIS — N2581 Secondary hyperparathyroidism of renal origin: Secondary | ICD-10-CM | POA: Diagnosis not present

## 2016-03-17 DIAGNOSIS — N186 End stage renal disease: Secondary | ICD-10-CM | POA: Diagnosis not present

## 2016-03-19 DIAGNOSIS — N2581 Secondary hyperparathyroidism of renal origin: Secondary | ICD-10-CM | POA: Diagnosis not present

## 2016-03-19 DIAGNOSIS — D631 Anemia in chronic kidney disease: Secondary | ICD-10-CM | POA: Diagnosis not present

## 2016-03-19 DIAGNOSIS — N186 End stage renal disease: Secondary | ICD-10-CM | POA: Diagnosis not present

## 2016-03-21 DIAGNOSIS — N2581 Secondary hyperparathyroidism of renal origin: Secondary | ICD-10-CM | POA: Diagnosis not present

## 2016-03-21 DIAGNOSIS — D631 Anemia in chronic kidney disease: Secondary | ICD-10-CM | POA: Diagnosis not present

## 2016-03-21 DIAGNOSIS — N186 End stage renal disease: Secondary | ICD-10-CM | POA: Diagnosis not present

## 2016-03-23 DIAGNOSIS — N186 End stage renal disease: Secondary | ICD-10-CM | POA: Diagnosis not present

## 2016-03-23 DIAGNOSIS — N2581 Secondary hyperparathyroidism of renal origin: Secondary | ICD-10-CM | POA: Diagnosis not present

## 2016-03-23 DIAGNOSIS — D631 Anemia in chronic kidney disease: Secondary | ICD-10-CM | POA: Diagnosis not present

## 2016-03-25 DIAGNOSIS — N186 End stage renal disease: Secondary | ICD-10-CM | POA: Diagnosis not present

## 2016-03-25 DIAGNOSIS — N2581 Secondary hyperparathyroidism of renal origin: Secondary | ICD-10-CM | POA: Diagnosis not present

## 2016-03-25 DIAGNOSIS — D631 Anemia in chronic kidney disease: Secondary | ICD-10-CM | POA: Diagnosis not present

## 2016-03-28 DIAGNOSIS — N2581 Secondary hyperparathyroidism of renal origin: Secondary | ICD-10-CM | POA: Diagnosis not present

## 2016-03-28 DIAGNOSIS — N186 End stage renal disease: Secondary | ICD-10-CM | POA: Diagnosis not present

## 2016-03-28 DIAGNOSIS — D631 Anemia in chronic kidney disease: Secondary | ICD-10-CM | POA: Diagnosis not present

## 2016-03-31 DIAGNOSIS — D631 Anemia in chronic kidney disease: Secondary | ICD-10-CM | POA: Diagnosis not present

## 2016-03-31 DIAGNOSIS — N186 End stage renal disease: Secondary | ICD-10-CM | POA: Diagnosis not present

## 2016-03-31 DIAGNOSIS — N2581 Secondary hyperparathyroidism of renal origin: Secondary | ICD-10-CM | POA: Diagnosis not present

## 2016-04-01 ENCOUNTER — Encounter: Payer: Self-pay | Admitting: Cardiology

## 2016-04-02 DIAGNOSIS — N2581 Secondary hyperparathyroidism of renal origin: Secondary | ICD-10-CM | POA: Diagnosis not present

## 2016-04-02 DIAGNOSIS — D631 Anemia in chronic kidney disease: Secondary | ICD-10-CM | POA: Diagnosis not present

## 2016-04-02 DIAGNOSIS — N186 End stage renal disease: Secondary | ICD-10-CM | POA: Diagnosis not present

## 2016-04-03 DIAGNOSIS — N186 End stage renal disease: Secondary | ICD-10-CM | POA: Diagnosis not present

## 2016-04-03 DIAGNOSIS — I120 Hypertensive chronic kidney disease with stage 5 chronic kidney disease or end stage renal disease: Secondary | ICD-10-CM | POA: Diagnosis not present

## 2016-04-03 DIAGNOSIS — Z992 Dependence on renal dialysis: Secondary | ICD-10-CM | POA: Diagnosis not present

## 2016-04-04 DIAGNOSIS — N2581 Secondary hyperparathyroidism of renal origin: Secondary | ICD-10-CM | POA: Diagnosis not present

## 2016-04-04 DIAGNOSIS — D631 Anemia in chronic kidney disease: Secondary | ICD-10-CM | POA: Diagnosis not present

## 2016-04-04 DIAGNOSIS — N186 End stage renal disease: Secondary | ICD-10-CM | POA: Diagnosis not present

## 2016-04-06 NOTE — Progress Notes (Deleted)
04/06/2016 Gregory [name]   1951/04/08  [id number]  Primary Physician Lauree Chandler, NP Primary Cardiologist: Dr Stanford Breed  HPI:  65 y/o AA male seen at the request of Nephrology for evaluation of elevated HR. 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 He was seen prior to bilateral iliac aneurysm repair for pre op clearance. A repeat echo showed his EF to now be 60-65% Nov 2015. A Myoview was low risk in Dec 2015. He has no heart failure symptoms. He denies any chest pain. His B/P is controlled now that he is on dialysis (2 yrs).    Current Outpatient Prescriptions  Medication Sig Dispense Refill  . colchicine 0.6 MG tablet Take 1 tablet by mouth daily as needed.  0  . Ferric Citrate (AURYXIA) 210 MG TABS Take 1 capsule by mouth 3 (three) times daily with meals.    . fludrocortisone (FLORINEF) 0.1 MG tablet Take 0.1 mg by mouth 3 (three) times a week. Mon Wed Fri    . midodrine (PROAMATINE) 10 MG tablet Take 10 mg by mouth 2 (two) times daily.     Marland Kitchen morphine (MSIR) 15 MG tablet take 1 tablet by mouth every 6 hours if needed for pain  0  . omeprazole (PRILOSEC) 40 MG capsule Take 40 mg by mouth daily.    . predniSONE (DELTASONE) 5 MG tablet Take 1 tablet by mouth daily as needed.  0  . promethazine (PHENERGAN) 25 MG tablet Take 25 mg by mouth every 6 (six) hours as needed for nausea or vomiting.    . SENSIPAR 30 MG tablet Take 30 mg by mouth daily.     No current facility-administered medications for this visit.    Facility-Administered Medications Ordered in Other Visits  Medication Dose Route Frequency Provider Last Rate Last Dose  . vancomycin (VANCOCIN) IVPB 1000 mg/200 mL premix  1,000 mg Intravenous 60 min Pre-Op Elam Dutch, MD        No Known Allergies  Social History   Social History  . Marital status: Married    Spouse name: N/A  . Number of children: 2  . Years of education: N/A   Occupational History  .  Retired    Social History Main Topics  . Smoking status: Never Smoker  . Smokeless tobacco: Never Used  . Alcohol use No  . Drug use: No  . Sexual activity: Not Currently   Other Topics Concern  . Not on file   Social History Narrative  . No narrative on file     Review of Systems: General: negative for chills, fever, night sweats or weight changes.  Cardiovascular: negative for chest pain, dyspnea on exertion, edema, orthopnea, palpitations, paroxysmal nocturnal dyspnea or shortness of breath Dermatological: negative for rash Respiratory: negative for cough or wheezing Urologic: negative for hematuria Abdominal: negative for nausea, vomiting, diarrhea, bright red blood per rectum, melena, or hematemesis Neurologic: negative for visual changes, syncope, or dizziness All other systems reviewed and are otherwise negative except as noted above.    There were no vitals taken for this visit.  General appearance: alert, cooperative and no distress Neck: no carotid bruit and no JVD Lungs: clear to auscultation bilaterally Extremities: RUE AVF, no LE edema Skin: Skin color, texture, turgor normal. No rashes or lesions Neurologic: Grossly normal  EKG NSR  Laboratory data:  Lab Results  Component Value Date   WBC 5.2 11/09/2014   HGB 15.3  04/24/2015   HCT 45.0 04/24/2015   PLT 102 (L) 11/09/2014   GLUCOSE 84 04/24/2015   CHOL 218 (H) 02/23/2014   TRIG 100 02/23/2014   HDL 64 02/23/2014   LDLCALC 134 (H) 02/23/2014   ALT 8 (L) 11/02/2014   AST 15 11/02/2014   NA 136 04/24/2015   K 5.7 (H) 04/24/2015   CL 98 (L) 11/09/2014   CREATININE 6.22 (H) 11/09/2014   BUN 18 11/09/2014   CO2 28 11/09/2014   TSH 1.260 04/07/2013   PSA 1.5 04/07/2013   INR 1.22 11/08/2014     ASSESSMENT AND PLAN:   No problem-specific Assessment & Plan notes found for this encounter.   PLAN  Mr Colston cardiomyopathy may have been related to HTN. It has resolved after starting HD. His  Myoview was negative. He has no heart failure on exam. We can see again as needed.   Peter Martinique PA-C 04/06/2016 10:47 AM

## 2016-04-07 DIAGNOSIS — N186 End stage renal disease: Secondary | ICD-10-CM | POA: Diagnosis not present

## 2016-04-07 DIAGNOSIS — D631 Anemia in chronic kidney disease: Secondary | ICD-10-CM | POA: Diagnosis not present

## 2016-04-07 DIAGNOSIS — N2581 Secondary hyperparathyroidism of renal origin: Secondary | ICD-10-CM | POA: Diagnosis not present

## 2016-04-08 ENCOUNTER — Telehealth: Payer: Self-pay | Admitting: Nurse Practitioner

## 2016-04-08 ENCOUNTER — Ambulatory Visit: Payer: Medicare Other | Admitting: Cardiology

## 2016-04-08 NOTE — Telephone Encounter (Signed)
left msg asking pt to confirm this AWV appt w/ nurse, then EV w/ Janett Billow. VDM (DD)

## 2016-04-09 DIAGNOSIS — D631 Anemia in chronic kidney disease: Secondary | ICD-10-CM | POA: Diagnosis not present

## 2016-04-09 DIAGNOSIS — N186 End stage renal disease: Secondary | ICD-10-CM | POA: Diagnosis not present

## 2016-04-09 DIAGNOSIS — N2581 Secondary hyperparathyroidism of renal origin: Secondary | ICD-10-CM | POA: Diagnosis not present

## 2016-04-11 DIAGNOSIS — N186 End stage renal disease: Secondary | ICD-10-CM | POA: Diagnosis not present

## 2016-04-11 DIAGNOSIS — N2581 Secondary hyperparathyroidism of renal origin: Secondary | ICD-10-CM | POA: Diagnosis not present

## 2016-04-11 DIAGNOSIS — D631 Anemia in chronic kidney disease: Secondary | ICD-10-CM | POA: Diagnosis not present

## 2016-04-14 DIAGNOSIS — D631 Anemia in chronic kidney disease: Secondary | ICD-10-CM | POA: Diagnosis not present

## 2016-04-14 DIAGNOSIS — N2581 Secondary hyperparathyroidism of renal origin: Secondary | ICD-10-CM | POA: Diagnosis not present

## 2016-04-14 DIAGNOSIS — N186 End stage renal disease: Secondary | ICD-10-CM | POA: Diagnosis not present

## 2016-04-16 ENCOUNTER — Encounter: Payer: Self-pay | Admitting: Physician Assistant

## 2016-04-16 ENCOUNTER — Ambulatory Visit (INDEPENDENT_AMBULATORY_CARE_PROVIDER_SITE_OTHER): Payer: Medicare Other | Admitting: Physician Assistant

## 2016-04-16 VITALS — BP 140/88 | HR 71 | Ht 72.0 in | Wt 191.6 lb

## 2016-04-16 DIAGNOSIS — N186 End stage renal disease: Secondary | ICD-10-CM

## 2016-04-16 DIAGNOSIS — R002 Palpitations: Secondary | ICD-10-CM | POA: Diagnosis not present

## 2016-04-16 DIAGNOSIS — R011 Cardiac murmur, unspecified: Secondary | ICD-10-CM | POA: Diagnosis not present

## 2016-04-16 DIAGNOSIS — Z992 Dependence on renal dialysis: Secondary | ICD-10-CM

## 2016-04-16 DIAGNOSIS — I723 Aneurysm of iliac artery: Secondary | ICD-10-CM

## 2016-04-16 DIAGNOSIS — I10 Essential (primary) hypertension: Secondary | ICD-10-CM

## 2016-04-16 DIAGNOSIS — I428 Other cardiomyopathies: Secondary | ICD-10-CM

## 2016-04-16 DIAGNOSIS — D631 Anemia in chronic kidney disease: Secondary | ICD-10-CM | POA: Diagnosis not present

## 2016-04-16 DIAGNOSIS — N2581 Secondary hyperparathyroidism of renal origin: Secondary | ICD-10-CM | POA: Diagnosis not present

## 2016-04-16 NOTE — Patient Instructions (Addendum)
Medication Instructions:  Your physician recommends that you continue on your current medications as directed. Please refer to the Current Medication list given to you today.  Labwork: None   Testing/Procedures: Your physician has recommended that you wear an 30 day event monitor. Event monitors are medical devices that record the heart's electrical activity. Doctors most often [place] these monitors to diagnose arrhythmias. Arrhythmias are problems with the speed or rhythm of the heartbeat. The monitor is a small, portable device. You can wear one while you do your normal daily activities. This is usually used to diagnose what is causing palpitations/syncope (passing out).  Your physician has requested that you have an echocardiogram. Echocardiography is a painless test that uses sound waves to create images of your heart. It provides your doctor with information about the size and shape of your heart and how well your heart's chambers and valves are working. This procedure takes approximately one hour. There are no restrictions for this procedure.  Follow-Up: Your physician recommends that you schedule a follow-up appointment in: 2 [place].  Any Other Special Instructions Will Be Listed Below (If Applicable).  PLEASE CONTACT YOUR PCP IN REGARDS TO YOUR ELEVATED PSA  If you need a refill on your cardiac medications before your next appointment, please call your pharmacy.

## 2016-04-16 NOTE — Progress Notes (Signed)
Cardiology Office Note    Date:  04/16/2016   ID:  Gregory [name], DOB [birth year], MRN [id number]  PCP:  Lauree Chandler, NP  Cardiologist:  Dr. Stanford Breed Vascular surgery: Dr. Oneida Alar  Chief Complaint  Patient presents with  . Follow-up    seen for Dr. Stanford Breed, tachypalpitation and irregular heart rate during dialysis    History of Present Illness:  Gregory [name] is a 65 y.o. male with PMH of HTN, ESRD on HD MWF, PVD, and prior cardiomyopathy (30-35%) by echo in 12/2012. A repeat echocardiogram obtained on 03/16/2014 after he started on dialysis shows EF improved to 59-56%, grade 1 diastolic dysfunction. He underwent bilateral iliac artery aneurysm repair on 11/08/2014. 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 He underwent plication of proximal third of right upper extremity AV fistula on 04/24/2015. His last CTA of abdomen on 12/04/2015 showed stable infrarenal aortic stent graft was persistent type II endoleak, continued exclusion of left common iliac artery aneurysm stable in diameter, no interval change in celiac, distal splenic splenic and SMA aneurysm.  He presents today for cardiology office visit, he denies any obvious chest discomfort, shortness of breath, orthopnea or paroxysmal nocturnal dyspnea. He does have 1+ pitting edema in the lower extremity. During the last few dialysis sessions, he has been noticing that his heart rate going up to 140s and coming back down to the 50s range. He says usually it happens when he is hooked up to dialysis machine. Unfortunately he was not connected to a monitor at those times. He denies any dizziness or presyncope.    Past Medical History:  Diagnosis Date  . Anemia in chronic kidney disease   . Arthritis    "hands; basically all my joints" (11/08/2014)  . CHF (congestive heart failure) ([place])   . Chronic back pain    "mostly lower" (11/08/2014)  . End stage renal disease  ([place])    pt does not urinate; pt. states that he does dialysis on MWF; [place]" (11/08/2014)  . GERD (gastroesophageal reflux disease)    "sometimes" (11/08/2014)  . Gout    "hands" (11/08/2014)  . Heart failure with reduced ejection fraction ([place])   . Hypertension     Past Surgical History:  Procedure Laterality Date  . AV FISTULA PLACEMENT Right 07/26/2009  . EMBOLIZATION Left 11/08/2014   internal iliac artery; Gore Excluder bifurcated stent graft for repair of common iliac aneurysm  . ENDOVASCULAR STENT INSERTION Bilateral 11/08/2014   Procedure: REPAIR OF BILATERAL ILIAC ARTERY ANEURYSM WITH  Bifurcated stent, with coiling left internal artery;  Surgeon: Elam Dutch, MD;  Location: [place];  Service: Vascular;  Laterality: Bilateral;  . INSERTION OF DIALYSIS CATHETER N/A 02/22/2013   Procedure: INSERTION OF DIALYSIS CATHETER;  Surgeon: Elam Dutch, MD;  Location: [place];  Service: Vascular;  Laterality: N/A;  . LIGATION OF ARTERIOVENOUS  FISTULA Right 38/75/6433   Procedure: PLICATION OF ARTERIOVENOUS  FISTULA;  Surgeon: Elam Dutch, MD;  Location: [facility] OR;  Service: Vascular;  Laterality: Right;  . REVISON OF ARTERIOVENOUS FISTULA Right 29/51/8841   Procedure: PLICATION OF RIGHT ARM  ARTERIOVENOUS FISTULA;  Surgeon: Elam Dutch, MD;  [facility];  Service: Vascular;  Laterality: Right;    Current Medications: Outpatient Medications Prior to Visit  Medication Sig Dispense Refill  . colchicine 0.6 MG tablet Take 1 tablet by mouth daily as needed.  0  . Ferric Citrate (  AURYXIA) 210 MG TABS Take 1 capsule by mouth 3 (three) times daily with meals.    . fludrocortisone (FLORINEF) 0.1 MG tablet Take 0.1 mg by mouth 3 (three) times a week. Mon Wed Fri    . midodrine (PROAMATINE) 10 MG tablet Take 10 mg by mouth 2 (two) times daily.     Marland Kitchen morphine (MSIR) 15 MG tablet take 1 tablet by mouth every 6 hours if needed for pain  0  . omeprazole (PRILOSEC) 40 MG capsule Take 40  mg by mouth daily.    . predniSONE (DELTASONE) 5 MG tablet Take 1 tablet by mouth daily as needed.  0  . promethazine (PHENERGAN) 25 MG tablet Take 25 mg by mouth every 6 (six) hours as needed for nausea or vomiting.    . SENSIPAR 30 MG tablet Take 30 mg by mouth daily.     Facility-Administered Medications Prior to Visit  Medication Dose Route Frequency Provider Last Rate Last Dose  . vancomycin (VANCOCIN) IVPB 1000 mg/200 mL premix  1,000 mg Intravenous 60 min Pre-Op Elam Dutch, MD         Allergies:   Patient has no known allergies.   Social History   Social History  . Marital status: Married    Spouse name: N/A  . Number of children: 2  . Years of education: N/A   Occupational History  . Retired    Social History Main Topics  . Smoking status: Never Smoker  . Smokeless tobacco: Never Used  . Alcohol use No  . Drug use: No  . Sexual activity: Not Currently   Other Topics Concern  . None   Social History Narrative  . None     Family History:  The patient's family history includes Diabetes in his father and sister; Hypertension in his father.   ROS:   Please see the history of present illness.    ROS All other systems reviewed and are negative.   PHYSICAL EXAM:   VS:  BP 140/88   Pulse 71   Ht 6' (1.829 m)   Wt 191 lb 9.6 oz (86.9 kg)   BMI 25.99 kg/m    GEN: Well nourished, well developed, in no acute distress  HEENT: normal  Neck: no JVD, carotid bruits, or masses Cardiac: RRR; no rubs, or gallops,no edema  2/6 systolic murmur at apex Respiratory:  clear to auscultation bilaterally, normal work of breathing GI: soft, nontender, nondistended, + BS MS: no deformity or atrophy  Skin: warm and dry, no rash Neuro:  Alert and Oriented x 3, Strength and sensation are intact Psych: euthymic mood, full affect  Wt Readings from Last 3 Encounters:  04/16/16 191 lb 9.6 oz (86.9 kg)  12/13/15 184 lb (83.5 kg)  05/10/15 182 lb (82.6 kg)       Studies/Labs Reviewed:   EKG:  EKG is ordered today.  The ekg ordered today demonstrates Normal sinus rhythm without significant ST-T wave changes, single PVC.  Recent Labs: 04/24/2015: Hemoglobin 15.3; Potassium 5.7; Sodium 136   Lipid Panel    Component Value Date/Time   CHOL 218 (H) 02/23/2014 0948   TRIG 100 02/23/2014 0948   HDL 64 02/23/2014 0948   CHOLHDL 3.4 02/23/2014 0948   LDLCALC 134 (H) 02/23/2014 0948    Additional studies/ records that were reviewed today include:   Echo 12/12/2012 LV EF: 30% -  35%  - Left ventricle: The cavity size was mildly dilated. Wall thickness was increased in a  pattern of moderate LVH. Systolic function was moderately to severely reduced. The estimated ejection fraction was in the range of 30% to 35%. Diffuse hypokinesis. Doppler parameters are consistent with abnormal left ventricular relaxation (grade 1 diastolic dysfunction). Doppler parameters are consistent with high ventricular filling pressure. - Ventricular septum: The contour showed diastolic flattening and systolic flattening. - Mitral valve: Calcified annulus. Mild regurgitation. - Left atrium: The atrium was mildly dilated. - Right ventricle: The cavity size was severely dilated. Systolic function was severely reduced. - Right atrium: The atrium was severely dilated. - Pulmonary arteries: PA peak pressure: 59mm Hg (S). - Pericardium, extracardiac: There was a left pleural effusion. - Impressions: There is biventricular dysfunction with RV dyfunction worse than LV. Impressions:  - There is biventricular dysfunction with RV dyfunction worse than LV.     Echo 03/16/2014 LV EF: 60% -  65%  - Left ventricle: The cavity size was normal. There was moderate concentric hypertrophy. Systolic function was normal. The estimated ejection fraction was in the range of 60% to 65%. Wall motion was normal; there were no regional wall  motion abnormalities. Doppler parameters are consistent with abnormal left ventricular relaxation (grade 1 diastolic dysfunction). Doppler parameters are consistent with elevated ventricular end-diastolic filling pressure. - Aortic valve: Moderately thickened, moderately calcified leaflets. There was no stenosis. There was no regurgitation. - Mitral valve: Calcified annulus. Moderately thickened, mildly calcified leaflets . Valve area by pressure half-time: 2 cm^2. Valve area by continuity equation (using LVOT flow): 2.42 cm^2. - Left atrium: The atrium was mildly dilated. - Right atrium: The atrium was normal in size. - Atrial septum: No defect or patent foramen ovale was identified. - Tricuspid valve: Structurally normal valve. There was trivial regurgitation. - Pulmonic valve: There was trivial regurgitation. - Inferior vena cava: The vessel was normal in size. The respirophasic diameter changes were in the normal range (= 50%), consistent with normal central venous pressure.   CTA of abdomen 12/04/2015  IMPRESSION: 1. Stable infrarenal aortic stent graft with persistent type 2 endoleak. 2. Continued exclusion of left common iliac artery aneurysm, stable in diameter. 3. No interval change in celiac axis, distal splenic, and SMA Aneurysms.     ASSESSMENT:    1. Palpitations   2. Heart murmur   3. NICM (nonischemic cardiomyopathy) ([place])   4. Essential hypertension, benign   5. Aneurysm of iliac artery (HCC)   6. ESRD on dialysis [facility])      PLAN:  In order of problems listed above:  1. Tachypalpitation: Seems to occur more often with dialysis, potential differential diagnosis include SVT, paroxysmal atrial fibrillation and sinus tachycardia. The patient, it occurs roughly 2-3 times a month. Plan for 30 day outpatient event monitor.  2. Systolic heart murmur: No obvious valve issues seen on the previous echocardiogram, plan for repeat  echocardiogram.  3. ESRD on HD MWF: Does have 1+ pitting edema in bilateral lower extremity, however lung is clear. Appears to be close to euvolemic level.  4. H/o NICM with improved EF: Ejection fraction was 30-35% in August 2014, however improved to 60-65% on repeat echo in November 2015.  5. HTN: Blood pressure mildly elevated today, however based on the previous vital signs, his systolic blood pressure has been as low as in the high 90s. Will hold off adding rate control medication until further assessment of tachycardia palpitation through event monitor.    Medication Adjustments/Labs and Tests Ordered: Current medicines are reviewed at length with the patient today.  Concerns  regarding medicines are outlined above.  Medication changes, Labs and Tests ordered today are listed in the Patient Instructions below. Patient Instructions  Medication Instructions:  Your physician recommends that you continue on your current medications as directed. Please refer to the Current Medication list given to you today.  Labwork: None   Testing/Procedures: Your physician has recommended that you wear an 30 day event monitor. Event monitors are medical devices that record the heart's electrical activity. Doctors most often [place] these monitors to diagnose arrhythmias. Arrhythmias are problems with the speed or rhythm of the heartbeat. The monitor is a small, portable device. You can wear one while you do your normal daily activities. This is usually used to diagnose what is causing palpitations/syncope (passing out).  Your physician has requested that you have an echocardiogram. Echocardiography is a painless test that uses sound waves to create images of your heart. It provides your doctor with information about the size and shape of your heart and how well your heart's chambers and valves are working. This procedure takes approximately one hour. There are no restrictions for this  procedure.  Follow-Up: Your physician recommends that you schedule a follow-up appointment in: 2 [place].  Any Other Special Instructions Will Be Listed Below (If Applicable).  If you need a refill on your cardiac medications before your next appointment, please call your pharmacy.     Hilbert Corrigan, [place]  04/16/2016 8:41 AM    [address], [place], [place]  [address] Phone: [phone]; Fax: [phone]

## 2016-04-17 NOTE — Telephone Encounter (Signed)
left another msg asking pt to confirm this appt. VDM (DD)

## 2016-04-18 DIAGNOSIS — D631 Anemia in chronic kidney disease: Secondary | ICD-10-CM | POA: Diagnosis not present

## 2016-04-18 DIAGNOSIS — N2581 Secondary hyperparathyroidism of renal origin: Secondary | ICD-10-CM | POA: Diagnosis not present

## 2016-04-18 DIAGNOSIS — N186 End stage renal disease: Secondary | ICD-10-CM | POA: Diagnosis not present

## 2016-04-21 DIAGNOSIS — N186 End stage renal disease: Secondary | ICD-10-CM | POA: Diagnosis not present

## 2016-04-21 DIAGNOSIS — N2581 Secondary hyperparathyroidism of renal origin: Secondary | ICD-10-CM | POA: Diagnosis not present

## 2016-04-21 DIAGNOSIS — D631 Anemia in chronic kidney disease: Secondary | ICD-10-CM | POA: Diagnosis not present

## 2016-04-23 DIAGNOSIS — N186 End stage renal disease: Secondary | ICD-10-CM | POA: Diagnosis not present

## 2016-04-23 DIAGNOSIS — N2581 Secondary hyperparathyroidism of renal origin: Secondary | ICD-10-CM | POA: Diagnosis not present

## 2016-04-23 DIAGNOSIS — D631 Anemia in chronic kidney disease: Secondary | ICD-10-CM | POA: Diagnosis not present

## 2016-04-24 ENCOUNTER — Ambulatory Visit: Payer: PRIVATE HEALTH INSURANCE

## 2016-04-24 ENCOUNTER — Ambulatory Visit (INDEPENDENT_AMBULATORY_CARE_PROVIDER_SITE_OTHER): Payer: Medicare Other

## 2016-04-24 ENCOUNTER — Other Ambulatory Visit: Payer: Self-pay | Admitting: Physician Assistant

## 2016-04-24 ENCOUNTER — Ambulatory Visit: Payer: Medicare Other | Admitting: Nurse Practitioner

## 2016-04-24 DIAGNOSIS — R002 Palpitations: Secondary | ICD-10-CM

## 2016-04-24 DIAGNOSIS — I428 Other cardiomyopathies: Secondary | ICD-10-CM

## 2016-04-25 DIAGNOSIS — N2581 Secondary hyperparathyroidism of renal origin: Secondary | ICD-10-CM | POA: Diagnosis not present

## 2016-04-25 DIAGNOSIS — N186 End stage renal disease: Secondary | ICD-10-CM | POA: Diagnosis not present

## 2016-04-25 DIAGNOSIS — D631 Anemia in chronic kidney disease: Secondary | ICD-10-CM | POA: Diagnosis not present

## 2016-04-27 DIAGNOSIS — N2581 Secondary hyperparathyroidism of renal origin: Secondary | ICD-10-CM | POA: Diagnosis not present

## 2016-04-27 DIAGNOSIS — D631 Anemia in chronic kidney disease: Secondary | ICD-10-CM | POA: Diagnosis not present

## 2016-04-27 DIAGNOSIS — N186 End stage renal disease: Secondary | ICD-10-CM | POA: Diagnosis not present

## 2016-04-30 DIAGNOSIS — N2581 Secondary hyperparathyroidism of renal origin: Secondary | ICD-10-CM | POA: Diagnosis not present

## 2016-04-30 DIAGNOSIS — N186 End stage renal disease: Secondary | ICD-10-CM | POA: Diagnosis not present

## 2016-04-30 DIAGNOSIS — D631 Anemia in chronic kidney disease: Secondary | ICD-10-CM | POA: Diagnosis not present

## 2016-05-02 DIAGNOSIS — N2581 Secondary hyperparathyroidism of renal origin: Secondary | ICD-10-CM | POA: Diagnosis not present

## 2016-05-02 DIAGNOSIS — N186 End stage renal disease: Secondary | ICD-10-CM | POA: Diagnosis not present

## 2016-05-02 DIAGNOSIS — D631 Anemia in chronic kidney disease: Secondary | ICD-10-CM | POA: Diagnosis not present

## 2016-05-04 DIAGNOSIS — Z992 Dependence on renal dialysis: Secondary | ICD-10-CM | POA: Diagnosis not present

## 2016-05-04 DIAGNOSIS — N2581 Secondary hyperparathyroidism of renal origin: Secondary | ICD-10-CM | POA: Diagnosis not present

## 2016-05-04 DIAGNOSIS — I120 Hypertensive chronic kidney disease with stage 5 chronic kidney disease or end stage renal disease: Secondary | ICD-10-CM | POA: Diagnosis not present

## 2016-05-04 DIAGNOSIS — D631 Anemia in chronic kidney disease: Secondary | ICD-10-CM | POA: Diagnosis not present

## 2016-05-04 DIAGNOSIS — N186 End stage renal disease: Secondary | ICD-10-CM | POA: Diagnosis not present

## 2016-05-07 DIAGNOSIS — N186 End stage renal disease: Secondary | ICD-10-CM | POA: Diagnosis not present

## 2016-05-07 DIAGNOSIS — N2581 Secondary hyperparathyroidism of renal origin: Secondary | ICD-10-CM | POA: Diagnosis not present

## 2016-05-07 DIAGNOSIS — D631 Anemia in chronic kidney disease: Secondary | ICD-10-CM | POA: Diagnosis not present

## 2016-05-09 DIAGNOSIS — N2581 Secondary hyperparathyroidism of renal origin: Secondary | ICD-10-CM | POA: Diagnosis not present

## 2016-05-09 DIAGNOSIS — N186 End stage renal disease: Secondary | ICD-10-CM | POA: Diagnosis not present

## 2016-05-09 DIAGNOSIS — D631 Anemia in chronic kidney disease: Secondary | ICD-10-CM | POA: Diagnosis not present

## 2016-05-12 DIAGNOSIS — D631 Anemia in chronic kidney disease: Secondary | ICD-10-CM | POA: Diagnosis not present

## 2016-05-12 DIAGNOSIS — N2581 Secondary hyperparathyroidism of renal origin: Secondary | ICD-10-CM | POA: Diagnosis not present

## 2016-05-12 DIAGNOSIS — N186 End stage renal disease: Secondary | ICD-10-CM | POA: Diagnosis not present

## 2016-05-14 DIAGNOSIS — D631 Anemia in chronic kidney disease: Secondary | ICD-10-CM | POA: Diagnosis not present

## 2016-05-14 DIAGNOSIS — N2581 Secondary hyperparathyroidism of renal origin: Secondary | ICD-10-CM | POA: Diagnosis not present

## 2016-05-14 DIAGNOSIS — N186 End stage renal disease: Secondary | ICD-10-CM | POA: Diagnosis not present

## 2016-05-15 ENCOUNTER — Other Ambulatory Visit: Payer: Self-pay

## 2016-05-15 ENCOUNTER — Ambulatory Visit (HOSPITAL_COMMUNITY): Payer: Medicare Other | Attending: Cardiovascular Disease

## 2016-05-15 DIAGNOSIS — R002 Palpitations: Secondary | ICD-10-CM | POA: Diagnosis not present

## 2016-05-15 DIAGNOSIS — R011 Cardiac murmur, unspecified: Secondary | ICD-10-CM | POA: Diagnosis not present

## 2016-05-15 DIAGNOSIS — I517 Cardiomegaly: Secondary | ICD-10-CM | POA: Diagnosis not present

## 2016-05-15 DIAGNOSIS — I350 Nonrheumatic aortic (valve) stenosis: Secondary | ICD-10-CM | POA: Insufficient documentation

## 2016-05-16 DIAGNOSIS — N2581 Secondary hyperparathyroidism of renal origin: Secondary | ICD-10-CM | POA: Diagnosis not present

## 2016-05-16 DIAGNOSIS — N186 End stage renal disease: Secondary | ICD-10-CM | POA: Diagnosis not present

## 2016-05-16 DIAGNOSIS — D631 Anemia in chronic kidney disease: Secondary | ICD-10-CM | POA: Diagnosis not present

## 2016-05-19 DIAGNOSIS — D631 Anemia in chronic kidney disease: Secondary | ICD-10-CM | POA: Diagnosis not present

## 2016-05-19 DIAGNOSIS — N2581 Secondary hyperparathyroidism of renal origin: Secondary | ICD-10-CM | POA: Diagnosis not present

## 2016-05-19 DIAGNOSIS — N186 End stage renal disease: Secondary | ICD-10-CM | POA: Diagnosis not present

## 2016-05-21 DIAGNOSIS — N2581 Secondary hyperparathyroidism of renal origin: Secondary | ICD-10-CM | POA: Diagnosis not present

## 2016-05-21 DIAGNOSIS — D631 Anemia in chronic kidney disease: Secondary | ICD-10-CM | POA: Diagnosis not present

## 2016-05-21 DIAGNOSIS — N186 End stage renal disease: Secondary | ICD-10-CM | POA: Diagnosis not present

## 2016-05-23 DIAGNOSIS — N186 End stage renal disease: Secondary | ICD-10-CM | POA: Diagnosis not present

## 2016-05-23 DIAGNOSIS — N2581 Secondary hyperparathyroidism of renal origin: Secondary | ICD-10-CM | POA: Diagnosis not present

## 2016-05-23 DIAGNOSIS — D631 Anemia in chronic kidney disease: Secondary | ICD-10-CM | POA: Diagnosis not present

## 2016-05-26 DIAGNOSIS — N186 End stage renal disease: Secondary | ICD-10-CM | POA: Diagnosis not present

## 2016-05-26 DIAGNOSIS — D631 Anemia in chronic kidney disease: Secondary | ICD-10-CM | POA: Diagnosis not present

## 2016-05-26 DIAGNOSIS — N2581 Secondary hyperparathyroidism of renal origin: Secondary | ICD-10-CM | POA: Diagnosis not present

## 2016-05-27 NOTE — Progress Notes (Signed)
HPI: FU palpitations. Previous CM improved; Echocardiogram August 2014 showed reduced LV function with EF 30-35. Nuclear study 12/15 showed EF 68 and normal perfusion. Echocardiogram January 2018 showed normal LV systolic function, grade 1 diastolic dysfunction, mild aortic stenosis with mean gradient 15 mmHg, mild mitral stenosis with mitral valve area pressure half time of 1.6 cm and biatrial enlargement. Seen recently for palpitations. Monitor December 2017 showed sinus with pacs and pvcs. Patient also has had previous aortic stent graft (persistent type 2 endoleak on CT 8/17) and iliac aneurysm repair followed by vascular surgery. Seen recently with palpitations. Since last seen denies dyspnea, chest pain or syncope. He continues to have occasional problems with low blood pressure on dialysis. He occasionally feels brief palpitations not sustained.  Current Outpatient Prescriptions  Medication Sig Dispense Refill  . colchicine 0.6 MG tablet Take 1 tablet by mouth daily as needed.  0  . Ferric Citrate (AURYXIA) 210 MG TABS Take 1 capsule by mouth 3 (three) times daily with meals.    . fludrocortisone (FLORINEF) 0.1 MG tablet Take 0.1 mg by mouth 3 (three) times a week. Mon Wed Fri    . midodrine (PROAMATINE) 10 MG tablet Take 10 mg by mouth 2 (two) times daily.     Marland Kitchen morphine (MSIR) 15 MG tablet take 1 tablet by mouth every 6 hours if needed for pain  0  . omeprazole (PRILOSEC) 40 MG capsule Take 40 mg by mouth daily.    . predniSONE (DELTASONE) 5 MG tablet Take 1 tablet by mouth daily as needed.  0  . promethazine (PHENERGAN) 25 MG tablet Take 25 mg by mouth every 6 (six) hours as needed for nausea or vomiting.    . SENSIPAR 30 MG tablet Take 30 mg by mouth daily.    Marland Kitchen oxyCODONE-acetaminophen (PERCOCET) 10-325 MG tablet Take 1 tablet by mouth 4 (four) times daily as needed.     No current facility-administered medications for this visit.    Facility-Administered Medications Ordered in  Other Visits  Medication Dose Route Frequency Provider Last Rate Last Dose  . vancomycin (VANCOCIN) IVPB 1000 mg/200 mL premix  1,000 mg Intravenous 60 min Pre-Op Elam Dutch, MD         Past Medical History:  Diagnosis Date  . Anemia in chronic kidney disease   . Arthritis    "hands; basically all my joints" (11/08/2014)  . CHF (congestive heart failure) ([place])   . Chronic back pain    "mostly lower" (11/08/2014)  . End stage renal disease ([place])    pt does not urinate; pt. states that he does dialysis on MWF; [place]" (11/08/2014)  . GERD (gastroesophageal reflux disease)    "sometimes" (11/08/2014)  . Gout    "hands" (11/08/2014)  . Heart failure with reduced ejection fraction ([place])   . Hypertension     Past Surgical History:  Procedure Laterality Date  . AV FISTULA PLACEMENT Right 07/26/2009  . EMBOLIZATION Left 11/08/2014   internal iliac artery; Gore Excluder bifurcated stent graft for repair of common iliac aneurysm  . ENDOVASCULAR STENT INSERTION Bilateral 11/08/2014   Procedure: REPAIR OF BILATERAL ILIAC ARTERY ANEURYSM WITH  Bifurcated stent, with coiling left internal artery;  Surgeon: Elam Dutch, MD;  Location: [facility];  Service: Vascular;  Laterality: Bilateral;  . INSERTION OF DIALYSIS CATHETER N/A 02/22/2013   Procedure: INSERTION OF DIALYSIS CATHETER;  Surgeon: Elam Dutch, MD;  Location: [facility];  Service: Vascular;  Laterality: N/A;  .  LIGATION OF ARTERIOVENOUS  FISTULA Right 57/90/3833   Procedure: PLICATION OF ARTERIOVENOUS  FISTULA;  Surgeon: Elam Dutch, MD;  Location: [facility] OR;  Service: Vascular;  Laterality: Right;  . REVISON OF ARTERIOVENOUS FISTULA Right 38/32/9191   Procedure: PLICATION OF RIGHT ARM  ARTERIOVENOUS FISTULA;  Surgeon: Elam Dutch, MD;  Location: [place];  Service: Vascular;  Laterality: Right;    Social History   Social History  . Marital status: Married    Spouse name: N/A  . Number of children: 2  . Years of education:  N/A   Occupational History  . Retired    Social History Main Topics  . Smoking status: Never Smoker  . Smokeless tobacco: Never Used  . Alcohol use No  . Drug use: No  . Sexual activity: Not Currently   Other Topics Concern  . Not on file   Social History Narrative  . No narrative on file    Family History  Problem Relation Age of Onset  . Diabetes Father   . Hypertension Father   . Diabetes Sister     ROS: no fevers or chills, productive cough, hemoptysis, dysphasia, odynophagia, melena, hematochezia, dysuria, hematuria, rash, seizure activity, orthopnea, PND, pedal edema, claudication. Remaining systems are negative.  Physical Exam: Well-developed well-nourished in no acute distress.  Skin is warm and dry.  HEENT is normal.  Neck is supple.  Chest is clear to auscultation with normal expansion.  Cardiovascular exam is regular rate and rhythm. 2/6 systolic murmur LSB Abdominal exam nontender or distended. No masses palpated. Extremities show trace edema. neuro grossly intact  A/P  1 Palpitations-these appear to be PACs and PVCs. I would be hasn't had a beta blocker as he has a history of hypertension which requires midodrine and Florinef. Pt also provided with alivecor name for future monitoring.  2 nonischemic cardiomyopathy-LV function improved on most recent echocardiogram.  3 history of aortic aneurysm repair with endoleak-followed by vascular surgery. Given vascular disease at aspirin 81 mg daily and Lipitor 40 mg daily. Check lipids and liver in 4 weeks.  4 end-stage renal disease-dialysis per nephrology.  5 mild aortic stenosis/mild mitral stenosis-he will need follow-up echoes in the future.  Kirk Ruths, MD

## 2016-05-28 DIAGNOSIS — N186 End stage renal disease: Secondary | ICD-10-CM | POA: Diagnosis not present

## 2016-05-28 DIAGNOSIS — N2581 Secondary hyperparathyroidism of renal origin: Secondary | ICD-10-CM | POA: Diagnosis not present

## 2016-05-28 DIAGNOSIS — D631 Anemia in chronic kidney disease: Secondary | ICD-10-CM | POA: Diagnosis not present

## 2016-05-30 DIAGNOSIS — N186 End stage renal disease: Secondary | ICD-10-CM | POA: Diagnosis not present

## 2016-05-30 DIAGNOSIS — D631 Anemia in chronic kidney disease: Secondary | ICD-10-CM | POA: Diagnosis not present

## 2016-05-30 DIAGNOSIS — N2581 Secondary hyperparathyroidism of renal origin: Secondary | ICD-10-CM | POA: Diagnosis not present

## 2016-06-02 DIAGNOSIS — N2581 Secondary hyperparathyroidism of renal origin: Secondary | ICD-10-CM | POA: Diagnosis not present

## 2016-06-02 DIAGNOSIS — N186 End stage renal disease: Secondary | ICD-10-CM | POA: Diagnosis not present

## 2016-06-02 DIAGNOSIS — D631 Anemia in chronic kidney disease: Secondary | ICD-10-CM | POA: Diagnosis not present

## 2016-06-04 DIAGNOSIS — N2581 Secondary hyperparathyroidism of renal origin: Secondary | ICD-10-CM | POA: Diagnosis not present

## 2016-06-04 DIAGNOSIS — D631 Anemia in chronic kidney disease: Secondary | ICD-10-CM | POA: Diagnosis not present

## 2016-06-04 DIAGNOSIS — N186 End stage renal disease: Secondary | ICD-10-CM | POA: Diagnosis not present

## 2016-06-04 DIAGNOSIS — I120 Hypertensive chronic kidney disease with stage 5 chronic kidney disease or end stage renal disease: Secondary | ICD-10-CM | POA: Diagnosis not present

## 2016-06-04 DIAGNOSIS — Z992 Dependence on renal dialysis: Secondary | ICD-10-CM | POA: Diagnosis not present

## 2016-06-05 ENCOUNTER — Encounter: Payer: Self-pay | Admitting: Cardiology

## 2016-06-05 ENCOUNTER — Ambulatory Visit (INDEPENDENT_AMBULATORY_CARE_PROVIDER_SITE_OTHER): Payer: Medicare Other | Admitting: Cardiology

## 2016-06-05 VITALS — BP 134/84 | HR 86 | Ht 74.0 in | Wt 186.4 lb

## 2016-06-05 DIAGNOSIS — I428 Other cardiomyopathies: Secondary | ICD-10-CM

## 2016-06-05 DIAGNOSIS — R002 Palpitations: Secondary | ICD-10-CM | POA: Diagnosis not present

## 2016-06-05 DIAGNOSIS — E7800 Pure hypercholesterolemia, unspecified: Secondary | ICD-10-CM | POA: Diagnosis not present

## 2016-06-05 MED ORDER — ASPIRIN EC 81 MG PO TBEC: 81.0000 mg | DELAYED_RELEASE_TABLET | Freq: Every day | ORAL | 3 refills | Status: DC

## 2016-06-05 MED ORDER — ATORVASTATIN CALCIUM 40 MG PO TABS: 40.0000 mg | ORAL_TABLET | Freq: Every day | ORAL | 3 refills | Status: DC

## 2016-06-05 NOTE — Patient Instructions (Signed)
Medication Instructions:   START ATORVASTATIN 40 MG ONCE DAILY  START ASPIRIN 81 MG ONCE DAILY  Labwork:  Your physician recommends that you return for lab work in: 4 WEEKS= DO NOT EAT PRIOR TO LAB WORK  Follow-Up:  Your physician wants you to follow-up in: [place] will receive a reminder letter in the mail two months in advance. If you don't receive a letter, please call our office to schedule the follow-up appointment.   If you need a refill on your cardiac medications before your next appointment, please call your pharmacy.

## 2016-06-06 DIAGNOSIS — N2581 Secondary hyperparathyroidism of renal origin: Secondary | ICD-10-CM | POA: Diagnosis not present

## 2016-06-06 DIAGNOSIS — D631 Anemia in chronic kidney disease: Secondary | ICD-10-CM | POA: Diagnosis not present

## 2016-06-06 DIAGNOSIS — N186 End stage renal disease: Secondary | ICD-10-CM | POA: Diagnosis not present

## 2016-06-09 DIAGNOSIS — D631 Anemia in chronic kidney disease: Secondary | ICD-10-CM | POA: Diagnosis not present

## 2016-06-09 DIAGNOSIS — N2581 Secondary hyperparathyroidism of renal origin: Secondary | ICD-10-CM | POA: Diagnosis not present

## 2016-06-09 DIAGNOSIS — N186 End stage renal disease: Secondary | ICD-10-CM | POA: Diagnosis not present

## 2016-06-11 DIAGNOSIS — N2581 Secondary hyperparathyroidism of renal origin: Secondary | ICD-10-CM | POA: Diagnosis not present

## 2016-06-11 DIAGNOSIS — D631 Anemia in chronic kidney disease: Secondary | ICD-10-CM | POA: Diagnosis not present

## 2016-06-11 DIAGNOSIS — N186 End stage renal disease: Secondary | ICD-10-CM | POA: Diagnosis not present

## 2016-06-12 ENCOUNTER — Encounter: Payer: Self-pay | Admitting: Vascular Surgery

## 2016-06-13 DIAGNOSIS — N2581 Secondary hyperparathyroidism of renal origin: Secondary | ICD-10-CM | POA: Diagnosis not present

## 2016-06-13 DIAGNOSIS — D631 Anemia in chronic kidney disease: Secondary | ICD-10-CM | POA: Diagnosis not present

## 2016-06-13 DIAGNOSIS — N186 End stage renal disease: Secondary | ICD-10-CM | POA: Diagnosis not present

## 2016-06-16 DIAGNOSIS — D631 Anemia in chronic kidney disease: Secondary | ICD-10-CM | POA: Diagnosis not present

## 2016-06-16 DIAGNOSIS — N2581 Secondary hyperparathyroidism of renal origin: Secondary | ICD-10-CM | POA: Diagnosis not present

## 2016-06-16 DIAGNOSIS — N186 End stage renal disease: Secondary | ICD-10-CM | POA: Diagnosis not present

## 2016-06-18 DIAGNOSIS — D631 Anemia in chronic kidney disease: Secondary | ICD-10-CM | POA: Diagnosis not present

## 2016-06-18 DIAGNOSIS — N186 End stage renal disease: Secondary | ICD-10-CM | POA: Diagnosis not present

## 2016-06-18 DIAGNOSIS — N2581 Secondary hyperparathyroidism of renal origin: Secondary | ICD-10-CM | POA: Diagnosis not present

## 2016-06-19 ENCOUNTER — Encounter: Payer: Self-pay | Admitting: Vascular Surgery

## 2016-06-19 ENCOUNTER — Ambulatory Visit (INDEPENDENT_AMBULATORY_CARE_PROVIDER_SITE_OTHER): Payer: Medicare Other | Admitting: Vascular Surgery

## 2016-06-19 ENCOUNTER — Ambulatory Visit (HOSPITAL_COMMUNITY)
Admission: RE | Admit: 2016-06-19 | Discharge: 2016-06-19 | Disposition: A | Discharge status: Home / Self Care | Payer: Medicare Other | Source: Ambulatory Visit | Attending: Vascular Surgery | Admitting: Vascular Surgery

## 2016-06-19 VITALS — BP 125/85 | HR 73 | Temp 98.4°F | Resp 20 | Ht 74.0 in | Wt 187.7 lb

## 2016-06-19 DIAGNOSIS — I723 Aneurysm of iliac artery: Secondary | ICD-10-CM | POA: Diagnosis not present

## 2016-06-19 NOTE — Progress Notes (Signed)
Patient is a 66 year old male who returns for follow-up today. He has also previously had aortic and iliac artery stent graft repair. 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 He denies any abdominal or back pain.   he reports no problems with his fistula. Chronic medical problems remain hypertension end-stage renal disease chronic back pain and congestive heart failure all of which are currently been stable. He is followed by Dr. Nelva Bush for his back pain.            Past Medical History  Diagnosis Date  . Hypertension    . CHF (congestive heart failure) ([place])    . Chronic back pain        "mostly lower" (11/08/2014)  . Heart failure with reduced ejection fraction ([place])    . Gout        "hands" (11/08/2014)  . GERD (gastroesophageal reflux disease)        "sometimes" (11/08/2014)  . Arthritis        "hands; basically all my joints" (11/08/2014)  . End stage renal disease ([place])        pt does not urinate; pt. states that he does dialysis on MWF; [place]" (11/08/2014)  . Anemia in chronic kidney disease                 Past Surgical History  Procedure Laterality Date  . Av fistula placement Right 07/26/2009  . Ligation of arteriovenous  fistula Right 02/22/2013      Procedure: PLICATION OF ARTERIOVENOUS  FISTULA;  Surgeon: Elam Dutch, MD;  Location: [place];  Service: Vascular;  Laterality: Right;  . Insertion of dialysis catheter N/A 02/22/2013      Procedure: INSERTION OF DIALYSIS CATHETER;  Surgeon: Elam Dutch, MD;  Location: [facility] OR;  Service: Vascular;  Laterality: N/A;  . Embolization Left 11/08/2014      internal iliac artery; Gore Excluder bifurcated stent graft for repair of common iliac aneurysm  . Endovascular stent insertion Bilateral 11/08/2014      Procedure: REPAIR OF BILATERAL ILIAC ARTERY ANEURYSM WITH   Bifurcated stent, with coiling left internal artery;  Surgeon: Elam Dutch, MD;  Location: [facility] OR;  Service: Vascular;  Laterality: Bilateral;  . Revison of arteriovenous fistula Right 04/24/2015      Procedure: PLICATION OF RIGHT ARM  ARTERIOVENOUS FISTULA;  Surgeon: Elam Dutch, MD;  Location: [facility] OR;  Service: Vascular;  Laterality: Right;      Physical exam:   Vitals:   06/19/16 1131  BP: 125/85  Pulse: 73  Resp: 20  Temp: 98.4 F (36.9 C)  TempSrc: Oral  SpO2: 99%  Weight: 187 lb 11.2 oz (85.1 kg)  Height: 6\' 2"  (1.88 m)     Right upper extremity: Palpable thrill in fistula     Abdomen: Soft non-tender 2+ femoral pulses    Data: Patient had an ultrasound of his abdominal aorta and iliacs today. Iliac diameter was 1.4 cm bilaterally, aortic diameter 3.6    Assessment: Aortoiliac stent graft for bilateral iliac aneurysms doing well with decreased size of iliac aneurysms bilaterally. We will need to do continued surveillance of the mesenteric aneurysms as well as the right common iliac artery.    Plan: He will follow-up in one year with our nurse practitioner with a repeat aortic ultrasound. He will  intermittently need evaluation of his superior mesenteric and splenic artery aneurysm probably every few years.   [name], MD [facility] Office: [phone] Pager: [phone]

## 2016-06-20 DIAGNOSIS — D631 Anemia in chronic kidney disease: Secondary | ICD-10-CM | POA: Diagnosis not present

## 2016-06-20 DIAGNOSIS — N186 End stage renal disease: Secondary | ICD-10-CM | POA: Diagnosis not present

## 2016-06-20 DIAGNOSIS — N2581 Secondary hyperparathyroidism of renal origin: Secondary | ICD-10-CM | POA: Diagnosis not present

## 2016-06-23 DIAGNOSIS — N186 End stage renal disease: Secondary | ICD-10-CM | POA: Diagnosis not present

## 2016-06-23 DIAGNOSIS — D631 Anemia in chronic kidney disease: Secondary | ICD-10-CM | POA: Diagnosis not present

## 2016-06-23 DIAGNOSIS — N2581 Secondary hyperparathyroidism of renal origin: Secondary | ICD-10-CM | POA: Diagnosis not present

## 2016-06-25 DIAGNOSIS — D631 Anemia in chronic kidney disease: Secondary | ICD-10-CM | POA: Diagnosis not present

## 2016-06-25 DIAGNOSIS — N2581 Secondary hyperparathyroidism of renal origin: Secondary | ICD-10-CM | POA: Diagnosis not present

## 2016-06-25 DIAGNOSIS — N186 End stage renal disease: Secondary | ICD-10-CM | POA: Diagnosis not present

## 2016-06-26 NOTE — Addendum Note (Signed)
Addended by: Lianne Cure A on: 06/26/2016 04:21 PM   Modules accepted: Orders

## 2016-06-27 DIAGNOSIS — N2581 Secondary hyperparathyroidism of renal origin: Secondary | ICD-10-CM | POA: Diagnosis not present

## 2016-06-27 DIAGNOSIS — D631 Anemia in chronic kidney disease: Secondary | ICD-10-CM | POA: Diagnosis not present

## 2016-06-27 DIAGNOSIS — N186 End stage renal disease: Secondary | ICD-10-CM | POA: Diagnosis not present

## 2016-06-30 DIAGNOSIS — N2581 Secondary hyperparathyroidism of renal origin: Secondary | ICD-10-CM | POA: Diagnosis not present

## 2016-06-30 DIAGNOSIS — D631 Anemia in chronic kidney disease: Secondary | ICD-10-CM | POA: Diagnosis not present

## 2016-06-30 DIAGNOSIS — N186 End stage renal disease: Secondary | ICD-10-CM | POA: Diagnosis not present

## 2016-07-01 DIAGNOSIS — M5136 Other intervertebral disc degeneration, lumbar region: Secondary | ICD-10-CM | POA: Diagnosis not present

## 2016-07-01 DIAGNOSIS — Z79891 Long term (current) use of opiate analgesic: Secondary | ICD-10-CM | POA: Diagnosis not present

## 2016-07-01 DIAGNOSIS — G894 Chronic pain syndrome: Secondary | ICD-10-CM | POA: Diagnosis not present

## 2016-07-02 DIAGNOSIS — D631 Anemia in chronic kidney disease: Secondary | ICD-10-CM | POA: Diagnosis not present

## 2016-07-02 DIAGNOSIS — N2581 Secondary hyperparathyroidism of renal origin: Secondary | ICD-10-CM | POA: Diagnosis not present

## 2016-07-02 DIAGNOSIS — N186 End stage renal disease: Secondary | ICD-10-CM | POA: Diagnosis not present

## 2016-07-02 DIAGNOSIS — I120 Hypertensive chronic kidney disease with stage 5 chronic kidney disease or end stage renal disease: Secondary | ICD-10-CM | POA: Diagnosis not present

## 2016-07-02 DIAGNOSIS — Z992 Dependence on renal dialysis: Secondary | ICD-10-CM | POA: Diagnosis not present

## 2016-07-03 DIAGNOSIS — E7800 Pure hypercholesterolemia, unspecified: Secondary | ICD-10-CM | POA: Diagnosis not present

## 2016-07-03 LAB — LIPID PANEL
Cholesterol: 154 mg/dL (ref <200)
HDL: 60 mg/dL (ref >40)
LDL CALC: 76 mg/dL (ref <100)
Total CHOL/HDL Ratio: 2.6 Ratio (ref <5.0)
Triglycerides: 90 mg/dL (ref <150)
VLDL: 18 mg/dL (ref <30)

## 2016-07-03 LAB — HEPATIC FUNCTION PANEL
ALBUMIN: 3.9 g/dL (ref 3.6–5.1)
ALK PHOS: 197 U/L — AB (ref 40–115)
ALT: 40 U/L (ref 9–46)
AST: 30 U/L (ref 10–35)
BILIRUBIN DIRECT: 0.3 mg/dL — AB (ref <=0.2)
BILIRUBIN TOTAL: 1 mg/dL (ref 0.2–1.2)
Indirect Bilirubin: 0.7 mg/dL (ref 0.2–1.2)
Total Protein: 6.7 g/dL (ref 6.1–8.1)

## 2016-07-04 ENCOUNTER — Telehealth: Payer: Self-pay | Admitting: *Deleted

## 2016-07-04 DIAGNOSIS — N2581 Secondary hyperparathyroidism of renal origin: Secondary | ICD-10-CM | POA: Diagnosis not present

## 2016-07-04 DIAGNOSIS — N186 End stage renal disease: Secondary | ICD-10-CM | POA: Diagnosis not present

## 2016-07-04 DIAGNOSIS — D631 Anemia in chronic kidney disease: Secondary | ICD-10-CM | POA: Diagnosis not present

## 2016-07-04 DIAGNOSIS — R748 Abnormal levels of other serum enzymes: Secondary | ICD-10-CM

## 2016-07-04 NOTE — Telephone Encounter (Signed)
Follow up    Pt is returning call to [facility].

## 2016-07-04 NOTE — Telephone Encounter (Signed)
Returned call to patient no answer.LMTC. 

## 2016-07-04 NOTE — Telephone Encounter (Addendum)
-----   Message from Lelon Perla, MD sent at 07/03/2016  1:35 PM EST ----- Check 5 ' nucleotidase and GGT Kirk Ruths  Left message for pt to call

## 2016-07-07 DIAGNOSIS — N2581 Secondary hyperparathyroidism of renal origin: Secondary | ICD-10-CM | POA: Diagnosis not present

## 2016-07-07 DIAGNOSIS — D631 Anemia in chronic kidney disease: Secondary | ICD-10-CM | POA: Diagnosis not present

## 2016-07-07 DIAGNOSIS — N186 End stage renal disease: Secondary | ICD-10-CM | POA: Diagnosis not present

## 2016-07-07 NOTE — Telephone Encounter (Signed)
Spoke with pt wife, aware of results. Lab orders mailed to the pt.

## 2016-07-07 NOTE — Telephone Encounter (Signed)
Gregory [name] is returning a call . Thanks

## 2016-07-08 ENCOUNTER — Telehealth: Payer: Self-pay | Admitting: Cardiology

## 2016-07-08 NOTE — Telephone Encounter (Signed)
Spoke with pt, aware repeat labs are related to elevated alkaline phosphatase. Questions regarding palpitations answered.

## 2016-07-08 NOTE — Telephone Encounter (Signed)
°   New message    pt verbalized that he is calling to speak to rn    About why he was told labs are needed

## 2016-07-09 DIAGNOSIS — D631 Anemia in chronic kidney disease: Secondary | ICD-10-CM | POA: Diagnosis not present

## 2016-07-09 DIAGNOSIS — N186 End stage renal disease: Secondary | ICD-10-CM | POA: Diagnosis not present

## 2016-07-09 DIAGNOSIS — N2581 Secondary hyperparathyroidism of renal origin: Secondary | ICD-10-CM | POA: Diagnosis not present

## 2016-07-11 DIAGNOSIS — D631 Anemia in chronic kidney disease: Secondary | ICD-10-CM | POA: Diagnosis not present

## 2016-07-11 DIAGNOSIS — N2581 Secondary hyperparathyroidism of renal origin: Secondary | ICD-10-CM | POA: Diagnosis not present

## 2016-07-11 DIAGNOSIS — N186 End stage renal disease: Secondary | ICD-10-CM | POA: Diagnosis not present

## 2016-07-14 DIAGNOSIS — N2581 Secondary hyperparathyroidism of renal origin: Secondary | ICD-10-CM | POA: Diagnosis not present

## 2016-07-14 DIAGNOSIS — D631 Anemia in chronic kidney disease: Secondary | ICD-10-CM | POA: Diagnosis not present

## 2016-07-14 DIAGNOSIS — N186 End stage renal disease: Secondary | ICD-10-CM | POA: Diagnosis not present

## 2016-07-16 ENCOUNTER — Telehealth: Payer: Self-pay | Admitting: Cardiology

## 2016-07-16 DIAGNOSIS — N2581 Secondary hyperparathyroidism of renal origin: Secondary | ICD-10-CM | POA: Diagnosis not present

## 2016-07-16 DIAGNOSIS — D631 Anemia in chronic kidney disease: Secondary | ICD-10-CM | POA: Diagnosis not present

## 2016-07-16 DIAGNOSIS — N186 End stage renal disease: Secondary | ICD-10-CM | POA: Diagnosis not present

## 2016-07-16 NOTE — Telephone Encounter (Signed)
New message    Patient c/o Palpitations:  High priority if patient c/o lightheadedness and shortness of breath.  1. How long have you been having palpitations? Pt states its been going on off and on. Today was the first time it was really high.   2. Are you currently experiencing lightheadedness and shortness of breath? No  3. Have you checked your BP and heart rate? (document readings) HR today 138 bp-123/94 at dialysis. Pt states his hr got up to 170 at one point. He states once he came off the machine his hr went back down to normal.  4. Are you experiencing any other symptoms? No.

## 2016-07-16 NOTE — Telephone Encounter (Signed)
Would not add beta blocker given h/o orthostatic hypotension Kirk Ruths

## 2016-07-16 NOTE — Telephone Encounter (Signed)
Returned call to patient. He had heart palpitations only during dialysis. He states he felt back to normal once he came off dialysis machine. He has palpations occasionally during dialysis. He wanted to see if Dr. Stanford Breed had med recommendations for him.  Denies chest pain, shortness of breath during these episodes. Unsure if related to fluid shifts (?)  Will defer to Dr. Stanford Breed

## 2016-07-17 NOTE — Telephone Encounter (Signed)
Spoke with pt, Aware of dr Jacalyn Lefevre recommendations. He will talk to the dialysis center about slowing the process down some to see if that will help with his palpitations.

## 2016-07-18 DIAGNOSIS — N2581 Secondary hyperparathyroidism of renal origin: Secondary | ICD-10-CM | POA: Diagnosis not present

## 2016-07-18 DIAGNOSIS — N186 End stage renal disease: Secondary | ICD-10-CM | POA: Diagnosis not present

## 2016-07-18 DIAGNOSIS — D631 Anemia in chronic kidney disease: Secondary | ICD-10-CM | POA: Diagnosis not present

## 2016-07-21 DIAGNOSIS — N186 End stage renal disease: Secondary | ICD-10-CM | POA: Diagnosis not present

## 2016-07-21 DIAGNOSIS — D631 Anemia in chronic kidney disease: Secondary | ICD-10-CM | POA: Diagnosis not present

## 2016-07-21 DIAGNOSIS — N2581 Secondary hyperparathyroidism of renal origin: Secondary | ICD-10-CM | POA: Diagnosis not present

## 2016-07-23 DIAGNOSIS — D631 Anemia in chronic kidney disease: Secondary | ICD-10-CM | POA: Diagnosis not present

## 2016-07-23 DIAGNOSIS — N2581 Secondary hyperparathyroidism of renal origin: Secondary | ICD-10-CM | POA: Diagnosis not present

## 2016-07-23 DIAGNOSIS — N186 End stage renal disease: Secondary | ICD-10-CM | POA: Diagnosis not present

## 2016-07-25 DIAGNOSIS — N2581 Secondary hyperparathyroidism of renal origin: Secondary | ICD-10-CM | POA: Diagnosis not present

## 2016-07-25 DIAGNOSIS — N186 End stage renal disease: Secondary | ICD-10-CM | POA: Diagnosis not present

## 2016-07-25 DIAGNOSIS — D631 Anemia in chronic kidney disease: Secondary | ICD-10-CM | POA: Diagnosis not present

## 2016-07-28 DIAGNOSIS — N186 End stage renal disease: Secondary | ICD-10-CM | POA: Diagnosis not present

## 2016-07-28 DIAGNOSIS — N2581 Secondary hyperparathyroidism of renal origin: Secondary | ICD-10-CM | POA: Diagnosis not present

## 2016-07-28 DIAGNOSIS — D631 Anemia in chronic kidney disease: Secondary | ICD-10-CM | POA: Diagnosis not present

## 2016-07-30 DIAGNOSIS — D631 Anemia in chronic kidney disease: Secondary | ICD-10-CM | POA: Diagnosis not present

## 2016-07-30 DIAGNOSIS — N186 End stage renal disease: Secondary | ICD-10-CM | POA: Diagnosis not present

## 2016-07-30 DIAGNOSIS — N2581 Secondary hyperparathyroidism of renal origin: Secondary | ICD-10-CM | POA: Diagnosis not present

## 2016-08-01 DIAGNOSIS — N2581 Secondary hyperparathyroidism of renal origin: Secondary | ICD-10-CM | POA: Diagnosis not present

## 2016-08-01 DIAGNOSIS — N186 End stage renal disease: Secondary | ICD-10-CM | POA: Diagnosis not present

## 2016-08-01 DIAGNOSIS — D631 Anemia in chronic kidney disease: Secondary | ICD-10-CM | POA: Diagnosis not present

## 2016-08-02 DIAGNOSIS — N186 End stage renal disease: Secondary | ICD-10-CM | POA: Diagnosis not present

## 2016-08-02 DIAGNOSIS — I120 Hypertensive chronic kidney disease with stage 5 chronic kidney disease or end stage renal disease: Secondary | ICD-10-CM | POA: Diagnosis not present

## 2016-08-02 DIAGNOSIS — Z992 Dependence on renal dialysis: Secondary | ICD-10-CM | POA: Diagnosis not present

## 2016-08-04 DIAGNOSIS — N186 End stage renal disease: Secondary | ICD-10-CM | POA: Diagnosis not present

## 2016-08-04 DIAGNOSIS — D631 Anemia in chronic kidney disease: Secondary | ICD-10-CM | POA: Diagnosis not present

## 2016-08-04 DIAGNOSIS — N2581 Secondary hyperparathyroidism of renal origin: Secondary | ICD-10-CM | POA: Diagnosis not present

## 2016-08-06 DIAGNOSIS — D631 Anemia in chronic kidney disease: Secondary | ICD-10-CM | POA: Diagnosis not present

## 2016-08-06 DIAGNOSIS — N2581 Secondary hyperparathyroidism of renal origin: Secondary | ICD-10-CM | POA: Diagnosis not present

## 2016-08-06 DIAGNOSIS — N186 End stage renal disease: Secondary | ICD-10-CM | POA: Diagnosis not present

## 2016-08-08 DIAGNOSIS — D631 Anemia in chronic kidney disease: Secondary | ICD-10-CM | POA: Diagnosis not present

## 2016-08-08 DIAGNOSIS — N186 End stage renal disease: Secondary | ICD-10-CM | POA: Diagnosis not present

## 2016-08-08 DIAGNOSIS — N2581 Secondary hyperparathyroidism of renal origin: Secondary | ICD-10-CM | POA: Diagnosis not present

## 2016-08-11 DIAGNOSIS — N186 End stage renal disease: Secondary | ICD-10-CM | POA: Diagnosis not present

## 2016-08-11 DIAGNOSIS — D631 Anemia in chronic kidney disease: Secondary | ICD-10-CM | POA: Diagnosis not present

## 2016-08-11 DIAGNOSIS — N2581 Secondary hyperparathyroidism of renal origin: Secondary | ICD-10-CM | POA: Diagnosis not present

## 2016-08-13 DIAGNOSIS — N2581 Secondary hyperparathyroidism of renal origin: Secondary | ICD-10-CM | POA: Diagnosis not present

## 2016-08-13 DIAGNOSIS — D631 Anemia in chronic kidney disease: Secondary | ICD-10-CM | POA: Diagnosis not present

## 2016-08-13 DIAGNOSIS — N186 End stage renal disease: Secondary | ICD-10-CM | POA: Diagnosis not present

## 2016-08-15 DIAGNOSIS — N2581 Secondary hyperparathyroidism of renal origin: Secondary | ICD-10-CM | POA: Diagnosis not present

## 2016-08-15 DIAGNOSIS — N186 End stage renal disease: Secondary | ICD-10-CM | POA: Diagnosis not present

## 2016-08-15 DIAGNOSIS — D631 Anemia in chronic kidney disease: Secondary | ICD-10-CM | POA: Diagnosis not present

## 2016-08-18 DIAGNOSIS — N186 End stage renal disease: Secondary | ICD-10-CM | POA: Diagnosis not present

## 2016-08-18 DIAGNOSIS — D631 Anemia in chronic kidney disease: Secondary | ICD-10-CM | POA: Diagnosis not present

## 2016-08-18 DIAGNOSIS — N2581 Secondary hyperparathyroidism of renal origin: Secondary | ICD-10-CM | POA: Diagnosis not present

## 2016-08-19 ENCOUNTER — Telehealth: Payer: Self-pay | Admitting: Nurse Practitioner

## 2016-08-19 NOTE — Telephone Encounter (Signed)
I called to confirm that patient AWV that's scheduled for 09/01/16. Pt's wife stated that he has dialysis on M,W,F and she will have him call me back.  I explained that I would like for a Tues/Thurs appt in the meantime. VDM (DD)

## 2016-08-20 DIAGNOSIS — D631 Anemia in chronic kidney disease: Secondary | ICD-10-CM | POA: Diagnosis not present

## 2016-08-20 DIAGNOSIS — N2581 Secondary hyperparathyroidism of renal origin: Secondary | ICD-10-CM | POA: Diagnosis not present

## 2016-08-20 DIAGNOSIS — N186 End stage renal disease: Secondary | ICD-10-CM | POA: Diagnosis not present

## 2016-08-22 DIAGNOSIS — N186 End stage renal disease: Secondary | ICD-10-CM | POA: Diagnosis not present

## 2016-08-22 DIAGNOSIS — N2581 Secondary hyperparathyroidism of renal origin: Secondary | ICD-10-CM | POA: Diagnosis not present

## 2016-08-22 DIAGNOSIS — D631 Anemia in chronic kidney disease: Secondary | ICD-10-CM | POA: Diagnosis not present

## 2016-08-25 DIAGNOSIS — D631 Anemia in chronic kidney disease: Secondary | ICD-10-CM | POA: Diagnosis not present

## 2016-08-25 DIAGNOSIS — N2581 Secondary hyperparathyroidism of renal origin: Secondary | ICD-10-CM | POA: Diagnosis not present

## 2016-08-25 DIAGNOSIS — N186 End stage renal disease: Secondary | ICD-10-CM | POA: Diagnosis not present

## 2016-08-27 DIAGNOSIS — N186 End stage renal disease: Secondary | ICD-10-CM | POA: Diagnosis not present

## 2016-08-27 DIAGNOSIS — N2581 Secondary hyperparathyroidism of renal origin: Secondary | ICD-10-CM | POA: Diagnosis not present

## 2016-08-27 DIAGNOSIS — D631 Anemia in chronic kidney disease: Secondary | ICD-10-CM | POA: Diagnosis not present

## 2016-08-29 DIAGNOSIS — N2581 Secondary hyperparathyroidism of renal origin: Secondary | ICD-10-CM | POA: Diagnosis not present

## 2016-08-29 DIAGNOSIS — D631 Anemia in chronic kidney disease: Secondary | ICD-10-CM | POA: Diagnosis not present

## 2016-08-29 DIAGNOSIS — N186 End stage renal disease: Secondary | ICD-10-CM | POA: Diagnosis not present

## 2016-09-01 ENCOUNTER — Ambulatory Visit: Payer: PRIVATE HEALTH INSURANCE | Admitting: Nurse Practitioner

## 2016-09-01 ENCOUNTER — Ambulatory Visit: Payer: PRIVATE HEALTH INSURANCE

## 2016-09-01 DIAGNOSIS — N2581 Secondary hyperparathyroidism of renal origin: Secondary | ICD-10-CM | POA: Diagnosis not present

## 2016-09-01 DIAGNOSIS — Z992 Dependence on renal dialysis: Secondary | ICD-10-CM | POA: Diagnosis not present

## 2016-09-01 DIAGNOSIS — N186 End stage renal disease: Secondary | ICD-10-CM | POA: Diagnosis not present

## 2016-09-01 DIAGNOSIS — D631 Anemia in chronic kidney disease: Secondary | ICD-10-CM | POA: Diagnosis not present

## 2016-09-01 DIAGNOSIS — I120 Hypertensive chronic kidney disease with stage 5 chronic kidney disease or end stage renal disease: Secondary | ICD-10-CM | POA: Diagnosis not present

## 2016-09-03 DIAGNOSIS — N2581 Secondary hyperparathyroidism of renal origin: Secondary | ICD-10-CM | POA: Diagnosis not present

## 2016-09-03 DIAGNOSIS — D631 Anemia in chronic kidney disease: Secondary | ICD-10-CM | POA: Diagnosis not present

## 2016-09-03 DIAGNOSIS — N186 End stage renal disease: Secondary | ICD-10-CM | POA: Diagnosis not present

## 2016-09-05 DIAGNOSIS — N2581 Secondary hyperparathyroidism of renal origin: Secondary | ICD-10-CM | POA: Diagnosis not present

## 2016-09-05 DIAGNOSIS — D631 Anemia in chronic kidney disease: Secondary | ICD-10-CM | POA: Diagnosis not present

## 2016-09-05 DIAGNOSIS — N186 End stage renal disease: Secondary | ICD-10-CM | POA: Diagnosis not present

## 2016-09-08 DIAGNOSIS — D631 Anemia in chronic kidney disease: Secondary | ICD-10-CM | POA: Diagnosis not present

## 2016-09-08 DIAGNOSIS — N186 End stage renal disease: Secondary | ICD-10-CM | POA: Diagnosis not present

## 2016-09-08 DIAGNOSIS — N2581 Secondary hyperparathyroidism of renal origin: Secondary | ICD-10-CM | POA: Diagnosis not present

## 2016-09-10 DIAGNOSIS — N186 End stage renal disease: Secondary | ICD-10-CM | POA: Diagnosis not present

## 2016-09-10 DIAGNOSIS — D631 Anemia in chronic kidney disease: Secondary | ICD-10-CM | POA: Diagnosis not present

## 2016-09-10 DIAGNOSIS — N2581 Secondary hyperparathyroidism of renal origin: Secondary | ICD-10-CM | POA: Diagnosis not present

## 2016-09-12 DIAGNOSIS — N186 End stage renal disease: Secondary | ICD-10-CM | POA: Diagnosis not present

## 2016-09-12 DIAGNOSIS — D631 Anemia in chronic kidney disease: Secondary | ICD-10-CM | POA: Diagnosis not present

## 2016-09-12 DIAGNOSIS — N2581 Secondary hyperparathyroidism of renal origin: Secondary | ICD-10-CM | POA: Diagnosis not present

## 2016-09-15 DIAGNOSIS — N2581 Secondary hyperparathyroidism of renal origin: Secondary | ICD-10-CM | POA: Diagnosis not present

## 2016-09-15 DIAGNOSIS — D631 Anemia in chronic kidney disease: Secondary | ICD-10-CM | POA: Diagnosis not present

## 2016-09-15 DIAGNOSIS — N186 End stage renal disease: Secondary | ICD-10-CM | POA: Diagnosis not present

## 2016-09-16 NOTE — Telephone Encounter (Signed)
I called the patient and left a message explaining that we need to cancel the AWV that's scheduled for 09/18/16 because the nurse will not be in the office.  I asked that he confirm whether or not he can make the EV w/ Jessica at 10:45.  I didn't reschedule the AWV yet because the pt has dialysis on Wednesdays per previous conversation. VDM (DD)

## 2016-09-17 DIAGNOSIS — N186 End stage renal disease: Secondary | ICD-10-CM | POA: Diagnosis not present

## 2016-09-17 DIAGNOSIS — N2581 Secondary hyperparathyroidism of renal origin: Secondary | ICD-10-CM | POA: Diagnosis not present

## 2016-09-17 DIAGNOSIS — D631 Anemia in chronic kidney disease: Secondary | ICD-10-CM | POA: Diagnosis not present

## 2016-09-18 ENCOUNTER — Ambulatory Visit: Payer: PRIVATE HEALTH INSURANCE

## 2016-09-18 ENCOUNTER — Ambulatory Visit: Payer: PRIVATE HEALTH INSURANCE | Admitting: Nurse Practitioner

## 2016-09-19 DIAGNOSIS — N2581 Secondary hyperparathyroidism of renal origin: Secondary | ICD-10-CM | POA: Diagnosis not present

## 2016-09-19 DIAGNOSIS — D631 Anemia in chronic kidney disease: Secondary | ICD-10-CM | POA: Diagnosis not present

## 2016-09-19 DIAGNOSIS — N186 End stage renal disease: Secondary | ICD-10-CM | POA: Diagnosis not present

## 2016-09-22 DIAGNOSIS — D631 Anemia in chronic kidney disease: Secondary | ICD-10-CM | POA: Diagnosis not present

## 2016-09-22 DIAGNOSIS — N2581 Secondary hyperparathyroidism of renal origin: Secondary | ICD-10-CM | POA: Diagnosis not present

## 2016-09-22 DIAGNOSIS — N186 End stage renal disease: Secondary | ICD-10-CM | POA: Diagnosis not present

## 2016-09-24 DIAGNOSIS — D631 Anemia in chronic kidney disease: Secondary | ICD-10-CM | POA: Diagnosis not present

## 2016-09-24 DIAGNOSIS — N186 End stage renal disease: Secondary | ICD-10-CM | POA: Diagnosis not present

## 2016-09-24 DIAGNOSIS — N2581 Secondary hyperparathyroidism of renal origin: Secondary | ICD-10-CM | POA: Diagnosis not present

## 2016-09-26 DIAGNOSIS — D631 Anemia in chronic kidney disease: Secondary | ICD-10-CM | POA: Diagnosis not present

## 2016-09-26 DIAGNOSIS — N2581 Secondary hyperparathyroidism of renal origin: Secondary | ICD-10-CM | POA: Diagnosis not present

## 2016-09-26 DIAGNOSIS — N186 End stage renal disease: Secondary | ICD-10-CM | POA: Diagnosis not present

## 2016-09-29 DIAGNOSIS — D631 Anemia in chronic kidney disease: Secondary | ICD-10-CM | POA: Diagnosis not present

## 2016-09-29 DIAGNOSIS — N2581 Secondary hyperparathyroidism of renal origin: Secondary | ICD-10-CM | POA: Diagnosis not present

## 2016-09-29 DIAGNOSIS — N186 End stage renal disease: Secondary | ICD-10-CM | POA: Diagnosis not present

## 2016-10-01 DIAGNOSIS — D631 Anemia in chronic kidney disease: Secondary | ICD-10-CM | POA: Diagnosis not present

## 2016-10-01 DIAGNOSIS — N2581 Secondary hyperparathyroidism of renal origin: Secondary | ICD-10-CM | POA: Diagnosis not present

## 2016-10-01 DIAGNOSIS — N186 End stage renal disease: Secondary | ICD-10-CM | POA: Diagnosis not present

## 2016-10-02 DIAGNOSIS — Z992 Dependence on renal dialysis: Secondary | ICD-10-CM | POA: Diagnosis not present

## 2016-10-02 DIAGNOSIS — N186 End stage renal disease: Secondary | ICD-10-CM | POA: Diagnosis not present

## 2016-10-02 DIAGNOSIS — I120 Hypertensive chronic kidney disease with stage 5 chronic kidney disease or end stage renal disease: Secondary | ICD-10-CM | POA: Diagnosis not present

## 2016-10-03 DIAGNOSIS — N186 End stage renal disease: Secondary | ICD-10-CM | POA: Diagnosis not present

## 2016-10-03 DIAGNOSIS — D631 Anemia in chronic kidney disease: Secondary | ICD-10-CM | POA: Diagnosis not present

## 2016-10-03 DIAGNOSIS — N2581 Secondary hyperparathyroidism of renal origin: Secondary | ICD-10-CM | POA: Diagnosis not present

## 2016-10-06 DIAGNOSIS — N186 End stage renal disease: Secondary | ICD-10-CM | POA: Diagnosis not present

## 2016-10-06 DIAGNOSIS — D631 Anemia in chronic kidney disease: Secondary | ICD-10-CM | POA: Diagnosis not present

## 2016-10-06 DIAGNOSIS — N2581 Secondary hyperparathyroidism of renal origin: Secondary | ICD-10-CM | POA: Diagnosis not present

## 2016-10-08 DIAGNOSIS — D631 Anemia in chronic kidney disease: Secondary | ICD-10-CM | POA: Diagnosis not present

## 2016-10-08 DIAGNOSIS — N2581 Secondary hyperparathyroidism of renal origin: Secondary | ICD-10-CM | POA: Diagnosis not present

## 2016-10-08 DIAGNOSIS — N186 End stage renal disease: Secondary | ICD-10-CM | POA: Diagnosis not present

## 2016-10-10 DIAGNOSIS — N186 End stage renal disease: Secondary | ICD-10-CM | POA: Diagnosis not present

## 2016-10-10 DIAGNOSIS — D631 Anemia in chronic kidney disease: Secondary | ICD-10-CM | POA: Diagnosis not present

## 2016-10-10 DIAGNOSIS — N2581 Secondary hyperparathyroidism of renal origin: Secondary | ICD-10-CM | POA: Diagnosis not present

## 2016-10-13 DIAGNOSIS — D631 Anemia in chronic kidney disease: Secondary | ICD-10-CM | POA: Diagnosis not present

## 2016-10-13 DIAGNOSIS — N186 End stage renal disease: Secondary | ICD-10-CM | POA: Diagnosis not present

## 2016-10-13 DIAGNOSIS — N2581 Secondary hyperparathyroidism of renal origin: Secondary | ICD-10-CM | POA: Diagnosis not present

## 2016-10-15 DIAGNOSIS — N2581 Secondary hyperparathyroidism of renal origin: Secondary | ICD-10-CM | POA: Diagnosis not present

## 2016-10-15 DIAGNOSIS — D631 Anemia in chronic kidney disease: Secondary | ICD-10-CM | POA: Diagnosis not present

## 2016-10-15 DIAGNOSIS — N186 End stage renal disease: Secondary | ICD-10-CM | POA: Diagnosis not present

## 2016-10-17 DIAGNOSIS — N2581 Secondary hyperparathyroidism of renal origin: Secondary | ICD-10-CM | POA: Diagnosis not present

## 2016-10-17 DIAGNOSIS — N186 End stage renal disease: Secondary | ICD-10-CM | POA: Diagnosis not present

## 2016-10-17 DIAGNOSIS — D631 Anemia in chronic kidney disease: Secondary | ICD-10-CM | POA: Diagnosis not present

## 2016-10-20 DIAGNOSIS — D631 Anemia in chronic kidney disease: Secondary | ICD-10-CM | POA: Diagnosis not present

## 2016-10-20 DIAGNOSIS — N186 End stage renal disease: Secondary | ICD-10-CM | POA: Diagnosis not present

## 2016-10-20 DIAGNOSIS — N2581 Secondary hyperparathyroidism of renal origin: Secondary | ICD-10-CM | POA: Diagnosis not present

## 2016-10-22 DIAGNOSIS — N186 End stage renal disease: Secondary | ICD-10-CM | POA: Diagnosis not present

## 2016-10-22 DIAGNOSIS — N2581 Secondary hyperparathyroidism of renal origin: Secondary | ICD-10-CM | POA: Diagnosis not present

## 2016-10-22 DIAGNOSIS — D631 Anemia in chronic kidney disease: Secondary | ICD-10-CM | POA: Diagnosis not present

## 2016-10-24 DIAGNOSIS — N2581 Secondary hyperparathyroidism of renal origin: Secondary | ICD-10-CM | POA: Diagnosis not present

## 2016-10-24 DIAGNOSIS — N186 End stage renal disease: Secondary | ICD-10-CM | POA: Diagnosis not present

## 2016-10-24 DIAGNOSIS — D631 Anemia in chronic kidney disease: Secondary | ICD-10-CM | POA: Diagnosis not present

## 2016-10-27 DIAGNOSIS — N2581 Secondary hyperparathyroidism of renal origin: Secondary | ICD-10-CM | POA: Diagnosis not present

## 2016-10-27 DIAGNOSIS — N186 End stage renal disease: Secondary | ICD-10-CM | POA: Diagnosis not present

## 2016-10-27 DIAGNOSIS — D631 Anemia in chronic kidney disease: Secondary | ICD-10-CM | POA: Diagnosis not present

## 2016-10-29 DIAGNOSIS — N2581 Secondary hyperparathyroidism of renal origin: Secondary | ICD-10-CM | POA: Diagnosis not present

## 2016-10-29 DIAGNOSIS — N186 End stage renal disease: Secondary | ICD-10-CM | POA: Diagnosis not present

## 2016-10-29 DIAGNOSIS — D631 Anemia in chronic kidney disease: Secondary | ICD-10-CM | POA: Diagnosis not present

## 2016-10-30 DIAGNOSIS — G894 Chronic pain syndrome: Secondary | ICD-10-CM | POA: Diagnosis not present

## 2016-10-30 DIAGNOSIS — Z79891 Long term (current) use of opiate analgesic: Secondary | ICD-10-CM | POA: Diagnosis not present

## 2016-10-30 DIAGNOSIS — M5136 Other intervertebral disc degeneration, lumbar region: Secondary | ICD-10-CM | POA: Diagnosis not present

## 2016-10-31 DIAGNOSIS — D631 Anemia in chronic kidney disease: Secondary | ICD-10-CM | POA: Diagnosis not present

## 2016-10-31 DIAGNOSIS — N186 End stage renal disease: Secondary | ICD-10-CM | POA: Diagnosis not present

## 2016-10-31 DIAGNOSIS — N2581 Secondary hyperparathyroidism of renal origin: Secondary | ICD-10-CM | POA: Diagnosis not present

## 2016-11-01 DIAGNOSIS — Z992 Dependence on renal dialysis: Secondary | ICD-10-CM | POA: Diagnosis not present

## 2016-11-01 DIAGNOSIS — I120 Hypertensive chronic kidney disease with stage 5 chronic kidney disease or end stage renal disease: Secondary | ICD-10-CM | POA: Diagnosis not present

## 2016-11-01 DIAGNOSIS — N186 End stage renal disease: Secondary | ICD-10-CM | POA: Diagnosis not present

## 2016-11-03 DIAGNOSIS — N2581 Secondary hyperparathyroidism of renal origin: Secondary | ICD-10-CM | POA: Diagnosis not present

## 2016-11-03 DIAGNOSIS — D631 Anemia in chronic kidney disease: Secondary | ICD-10-CM | POA: Diagnosis not present

## 2016-11-03 DIAGNOSIS — N186 End stage renal disease: Secondary | ICD-10-CM | POA: Diagnosis not present

## 2016-11-05 DIAGNOSIS — D631 Anemia in chronic kidney disease: Secondary | ICD-10-CM | POA: Diagnosis not present

## 2016-11-05 DIAGNOSIS — N186 End stage renal disease: Secondary | ICD-10-CM | POA: Diagnosis not present

## 2016-11-05 DIAGNOSIS — N2581 Secondary hyperparathyroidism of renal origin: Secondary | ICD-10-CM | POA: Diagnosis not present

## 2016-11-07 DIAGNOSIS — N186 End stage renal disease: Secondary | ICD-10-CM | POA: Diagnosis not present

## 2016-11-07 DIAGNOSIS — N2581 Secondary hyperparathyroidism of renal origin: Secondary | ICD-10-CM | POA: Diagnosis not present

## 2016-11-07 DIAGNOSIS — D631 Anemia in chronic kidney disease: Secondary | ICD-10-CM | POA: Diagnosis not present

## 2016-11-10 DIAGNOSIS — N2581 Secondary hyperparathyroidism of renal origin: Secondary | ICD-10-CM | POA: Diagnosis not present

## 2016-11-10 DIAGNOSIS — N186 End stage renal disease: Secondary | ICD-10-CM | POA: Diagnosis not present

## 2016-11-10 DIAGNOSIS — D631 Anemia in chronic kidney disease: Secondary | ICD-10-CM | POA: Diagnosis not present

## 2016-11-12 DIAGNOSIS — N186 End stage renal disease: Secondary | ICD-10-CM | POA: Diagnosis not present

## 2016-11-12 DIAGNOSIS — D631 Anemia in chronic kidney disease: Secondary | ICD-10-CM | POA: Diagnosis not present

## 2016-11-12 DIAGNOSIS — N2581 Secondary hyperparathyroidism of renal origin: Secondary | ICD-10-CM | POA: Diagnosis not present

## 2016-11-14 DIAGNOSIS — D631 Anemia in chronic kidney disease: Secondary | ICD-10-CM | POA: Diagnosis not present

## 2016-11-14 DIAGNOSIS — N186 End stage renal disease: Secondary | ICD-10-CM | POA: Diagnosis not present

## 2016-11-14 DIAGNOSIS — N2581 Secondary hyperparathyroidism of renal origin: Secondary | ICD-10-CM | POA: Diagnosis not present

## 2016-11-17 DIAGNOSIS — N2581 Secondary hyperparathyroidism of renal origin: Secondary | ICD-10-CM | POA: Diagnosis not present

## 2016-11-17 DIAGNOSIS — D631 Anemia in chronic kidney disease: Secondary | ICD-10-CM | POA: Diagnosis not present

## 2016-11-17 DIAGNOSIS — N186 End stage renal disease: Secondary | ICD-10-CM | POA: Diagnosis not present

## 2016-11-19 DIAGNOSIS — N2581 Secondary hyperparathyroidism of renal origin: Secondary | ICD-10-CM | POA: Diagnosis not present

## 2016-11-19 DIAGNOSIS — D631 Anemia in chronic kidney disease: Secondary | ICD-10-CM | POA: Diagnosis not present

## 2016-11-19 DIAGNOSIS — N186 End stage renal disease: Secondary | ICD-10-CM | POA: Diagnosis not present

## 2016-11-21 DIAGNOSIS — D631 Anemia in chronic kidney disease: Secondary | ICD-10-CM | POA: Diagnosis not present

## 2016-11-21 DIAGNOSIS — N186 End stage renal disease: Secondary | ICD-10-CM | POA: Diagnosis not present

## 2016-11-21 DIAGNOSIS — N2581 Secondary hyperparathyroidism of renal origin: Secondary | ICD-10-CM | POA: Diagnosis not present

## 2016-11-24 DIAGNOSIS — D631 Anemia in chronic kidney disease: Secondary | ICD-10-CM | POA: Diagnosis not present

## 2016-11-24 DIAGNOSIS — N2581 Secondary hyperparathyroidism of renal origin: Secondary | ICD-10-CM | POA: Diagnosis not present

## 2016-11-24 DIAGNOSIS — N186 End stage renal disease: Secondary | ICD-10-CM | POA: Diagnosis not present

## 2016-11-26 DIAGNOSIS — N2581 Secondary hyperparathyroidism of renal origin: Secondary | ICD-10-CM | POA: Diagnosis not present

## 2016-11-26 DIAGNOSIS — N186 End stage renal disease: Secondary | ICD-10-CM | POA: Diagnosis not present

## 2016-11-26 DIAGNOSIS — D631 Anemia in chronic kidney disease: Secondary | ICD-10-CM | POA: Diagnosis not present

## 2016-11-28 DIAGNOSIS — N2581 Secondary hyperparathyroidism of renal origin: Secondary | ICD-10-CM | POA: Diagnosis not present

## 2016-11-28 DIAGNOSIS — N186 End stage renal disease: Secondary | ICD-10-CM | POA: Diagnosis not present

## 2016-11-28 DIAGNOSIS — D631 Anemia in chronic kidney disease: Secondary | ICD-10-CM | POA: Diagnosis not present

## 2016-12-01 DIAGNOSIS — D631 Anemia in chronic kidney disease: Secondary | ICD-10-CM | POA: Diagnosis not present

## 2016-12-01 DIAGNOSIS — N2581 Secondary hyperparathyroidism of renal origin: Secondary | ICD-10-CM | POA: Diagnosis not present

## 2016-12-01 DIAGNOSIS — N186 End stage renal disease: Secondary | ICD-10-CM | POA: Diagnosis not present

## 2016-12-02 DIAGNOSIS — I120 Hypertensive chronic kidney disease with stage 5 chronic kidney disease or end stage renal disease: Secondary | ICD-10-CM | POA: Diagnosis not present

## 2016-12-02 DIAGNOSIS — Z992 Dependence on renal dialysis: Secondary | ICD-10-CM | POA: Diagnosis not present

## 2016-12-02 DIAGNOSIS — N186 End stage renal disease: Secondary | ICD-10-CM | POA: Diagnosis not present

## 2016-12-03 DIAGNOSIS — N2581 Secondary hyperparathyroidism of renal origin: Secondary | ICD-10-CM | POA: Diagnosis not present

## 2016-12-03 DIAGNOSIS — N186 End stage renal disease: Secondary | ICD-10-CM | POA: Diagnosis not present

## 2016-12-03 DIAGNOSIS — D631 Anemia in chronic kidney disease: Secondary | ICD-10-CM | POA: Diagnosis not present

## 2016-12-05 DIAGNOSIS — N186 End stage renal disease: Secondary | ICD-10-CM | POA: Diagnosis not present

## 2016-12-05 DIAGNOSIS — N2581 Secondary hyperparathyroidism of renal origin: Secondary | ICD-10-CM | POA: Diagnosis not present

## 2016-12-05 DIAGNOSIS — D631 Anemia in chronic kidney disease: Secondary | ICD-10-CM | POA: Diagnosis not present

## 2016-12-08 DIAGNOSIS — D631 Anemia in chronic kidney disease: Secondary | ICD-10-CM | POA: Diagnosis not present

## 2016-12-08 DIAGNOSIS — N2581 Secondary hyperparathyroidism of renal origin: Secondary | ICD-10-CM | POA: Diagnosis not present

## 2016-12-08 DIAGNOSIS — N186 End stage renal disease: Secondary | ICD-10-CM | POA: Diagnosis not present

## 2016-12-10 ENCOUNTER — Encounter: Payer: Self-pay | Admitting: Vascular Surgery

## 2016-12-10 DIAGNOSIS — N186 End stage renal disease: Secondary | ICD-10-CM | POA: Diagnosis not present

## 2016-12-10 DIAGNOSIS — D631 Anemia in chronic kidney disease: Secondary | ICD-10-CM | POA: Diagnosis not present

## 2016-12-10 DIAGNOSIS — N2581 Secondary hyperparathyroidism of renal origin: Secondary | ICD-10-CM | POA: Diagnosis not present

## 2016-12-11 ENCOUNTER — Encounter: Payer: Self-pay | Admitting: Vascular Surgery

## 2016-12-11 ENCOUNTER — Ambulatory Visit (INDEPENDENT_AMBULATORY_CARE_PROVIDER_SITE_OTHER): Payer: Medicare Other | Admitting: Vascular Surgery

## 2016-12-11 VITALS — BP 102/68 | HR 77 | Temp 97.9°F | Resp 16 | Ht 74.0 in | Wt 187.0 lb

## 2016-12-11 DIAGNOSIS — Z992 Dependence on renal dialysis: Secondary | ICD-10-CM | POA: Diagnosis not present

## 2016-12-11 DIAGNOSIS — N186 End stage renal disease: Secondary | ICD-10-CM

## 2016-12-11 NOTE — Progress Notes (Signed)
Patient is a 66 year old male referred by Dr. 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 He has had 2 prior plications of his right arm AV fistula. One was in 2014. The next one was in 2016. He denies any prior bleeding episodes. He has had no ulceration of the fistula. There was some concern that the skin was developing a shiny appearance.  Review of systems: Patient states the fistula is functioning well on dialysis. He denies any shortness of breath. He denies any chest pain.  Past medical history: Prior repair of iliac aneurysms with Gore Excluder stent graft and embolization of the left internal iliac artery.  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 He has an ectatic 3 cm right common iliac and 17 mm right internal iliac artery.  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 There are 3 discrete aneurysms. The one near the antecubital crease is lobulated about 4 cm diameter. The middle portion is again about 4 cm diameter and oval shaped. There is a similar aneurysm at the level of the shoulder. The skin does have some shiny appearance but I'm able to pick skin up off of the fistula and it is fairly thick and does not seem to have an area of erosion forming at this point.  Assessment: Aneurysmal degeneration right arm AV fistula. The patient has had 2 prior plications and I do not believe he is a candidate for an additional plication. I discussed with the patient today the possibility of converting this to an AV graft that he would like to preserve his fistula for now. I do not believe that he isn't imminent risk of bleeding because although the skin does have a slightly  shiny appearance I am able to pick this up and it seems to be several millimeters of skin covering the fistula at this point. I discussed with the patient and his wife today signs and symptoms of the scan continuing to vent out or ulceration. They will call me if any symptoms occur. I will also touch base with Dr. Arty Baumgartner today and be happy to see the patient back in the future if there are concerns that this is getting worse.  Plan: See above  [name], MD [facility] Office: [phone] Pager: [phone]

## 2016-12-12 DIAGNOSIS — N2581 Secondary hyperparathyroidism of renal origin: Secondary | ICD-10-CM | POA: Diagnosis not present

## 2016-12-12 DIAGNOSIS — N186 End stage renal disease: Secondary | ICD-10-CM | POA: Diagnosis not present

## 2016-12-12 DIAGNOSIS — D631 Anemia in chronic kidney disease: Secondary | ICD-10-CM | POA: Diagnosis not present

## 2016-12-15 DIAGNOSIS — D631 Anemia in chronic kidney disease: Secondary | ICD-10-CM | POA: Diagnosis not present

## 2016-12-15 DIAGNOSIS — N186 End stage renal disease: Secondary | ICD-10-CM | POA: Diagnosis not present

## 2016-12-15 DIAGNOSIS — N2581 Secondary hyperparathyroidism of renal origin: Secondary | ICD-10-CM | POA: Diagnosis not present

## 2016-12-17 DIAGNOSIS — N2581 Secondary hyperparathyroidism of renal origin: Secondary | ICD-10-CM | POA: Diagnosis not present

## 2016-12-17 DIAGNOSIS — D631 Anemia in chronic kidney disease: Secondary | ICD-10-CM | POA: Diagnosis not present

## 2016-12-17 DIAGNOSIS — N186 End stage renal disease: Secondary | ICD-10-CM | POA: Diagnosis not present

## 2016-12-19 DIAGNOSIS — N186 End stage renal disease: Secondary | ICD-10-CM | POA: Diagnosis not present

## 2016-12-19 DIAGNOSIS — N2581 Secondary hyperparathyroidism of renal origin: Secondary | ICD-10-CM | POA: Diagnosis not present

## 2016-12-19 DIAGNOSIS — D631 Anemia in chronic kidney disease: Secondary | ICD-10-CM | POA: Diagnosis not present

## 2016-12-22 DIAGNOSIS — D631 Anemia in chronic kidney disease: Secondary | ICD-10-CM | POA: Diagnosis not present

## 2016-12-22 DIAGNOSIS — N2581 Secondary hyperparathyroidism of renal origin: Secondary | ICD-10-CM | POA: Diagnosis not present

## 2016-12-22 DIAGNOSIS — N186 End stage renal disease: Secondary | ICD-10-CM | POA: Diagnosis not present

## 2016-12-24 DIAGNOSIS — N186 End stage renal disease: Secondary | ICD-10-CM | POA: Diagnosis not present

## 2016-12-24 DIAGNOSIS — N2581 Secondary hyperparathyroidism of renal origin: Secondary | ICD-10-CM | POA: Diagnosis not present

## 2016-12-24 DIAGNOSIS — D631 Anemia in chronic kidney disease: Secondary | ICD-10-CM | POA: Diagnosis not present

## 2016-12-26 DIAGNOSIS — N2581 Secondary hyperparathyroidism of renal origin: Secondary | ICD-10-CM | POA: Diagnosis not present

## 2016-12-26 DIAGNOSIS — D631 Anemia in chronic kidney disease: Secondary | ICD-10-CM | POA: Diagnosis not present

## 2016-12-26 DIAGNOSIS — N186 End stage renal disease: Secondary | ICD-10-CM | POA: Diagnosis not present

## 2016-12-29 DIAGNOSIS — N186 End stage renal disease: Secondary | ICD-10-CM | POA: Diagnosis not present

## 2016-12-29 DIAGNOSIS — N2581 Secondary hyperparathyroidism of renal origin: Secondary | ICD-10-CM | POA: Diagnosis not present

## 2016-12-29 DIAGNOSIS — D631 Anemia in chronic kidney disease: Secondary | ICD-10-CM | POA: Diagnosis not present

## 2016-12-31 DIAGNOSIS — D631 Anemia in chronic kidney disease: Secondary | ICD-10-CM | POA: Diagnosis not present

## 2016-12-31 DIAGNOSIS — N186 End stage renal disease: Secondary | ICD-10-CM | POA: Diagnosis not present

## 2016-12-31 DIAGNOSIS — N2581 Secondary hyperparathyroidism of renal origin: Secondary | ICD-10-CM | POA: Diagnosis not present

## 2017-01-02 DIAGNOSIS — D631 Anemia in chronic kidney disease: Secondary | ICD-10-CM | POA: Diagnosis not present

## 2017-01-02 DIAGNOSIS — Z992 Dependence on renal dialysis: Secondary | ICD-10-CM | POA: Diagnosis not present

## 2017-01-02 DIAGNOSIS — N2581 Secondary hyperparathyroidism of renal origin: Secondary | ICD-10-CM | POA: Diagnosis not present

## 2017-01-02 DIAGNOSIS — I120 Hypertensive chronic kidney disease with stage 5 chronic kidney disease or end stage renal disease: Secondary | ICD-10-CM | POA: Diagnosis not present

## 2017-01-02 DIAGNOSIS — N186 End stage renal disease: Secondary | ICD-10-CM | POA: Diagnosis not present

## 2017-01-05 DIAGNOSIS — D631 Anemia in chronic kidney disease: Secondary | ICD-10-CM | POA: Diagnosis not present

## 2017-01-05 DIAGNOSIS — N186 End stage renal disease: Secondary | ICD-10-CM | POA: Diagnosis not present

## 2017-01-05 DIAGNOSIS — Z23 Encounter for immunization: Secondary | ICD-10-CM | POA: Diagnosis not present

## 2017-01-05 DIAGNOSIS — N2581 Secondary hyperparathyroidism of renal origin: Secondary | ICD-10-CM | POA: Diagnosis not present

## 2017-01-07 DIAGNOSIS — Z23 Encounter for immunization: Secondary | ICD-10-CM | POA: Diagnosis not present

## 2017-01-07 DIAGNOSIS — N186 End stage renal disease: Secondary | ICD-10-CM | POA: Diagnosis not present

## 2017-01-07 DIAGNOSIS — D631 Anemia in chronic kidney disease: Secondary | ICD-10-CM | POA: Diagnosis not present

## 2017-01-07 DIAGNOSIS — N2581 Secondary hyperparathyroidism of renal origin: Secondary | ICD-10-CM | POA: Diagnosis not present

## 2017-01-09 DIAGNOSIS — N2581 Secondary hyperparathyroidism of renal origin: Secondary | ICD-10-CM | POA: Diagnosis not present

## 2017-01-09 DIAGNOSIS — N186 End stage renal disease: Secondary | ICD-10-CM | POA: Diagnosis not present

## 2017-01-09 DIAGNOSIS — D631 Anemia in chronic kidney disease: Secondary | ICD-10-CM | POA: Diagnosis not present

## 2017-01-09 DIAGNOSIS — Z23 Encounter for immunization: Secondary | ICD-10-CM | POA: Diagnosis not present

## 2017-01-12 DIAGNOSIS — N2581 Secondary hyperparathyroidism of renal origin: Secondary | ICD-10-CM | POA: Diagnosis not present

## 2017-01-12 DIAGNOSIS — D631 Anemia in chronic kidney disease: Secondary | ICD-10-CM | POA: Diagnosis not present

## 2017-01-12 DIAGNOSIS — N186 End stage renal disease: Secondary | ICD-10-CM | POA: Diagnosis not present

## 2017-01-12 DIAGNOSIS — Z23 Encounter for immunization: Secondary | ICD-10-CM | POA: Diagnosis not present

## 2017-01-14 DIAGNOSIS — N186 End stage renal disease: Secondary | ICD-10-CM | POA: Diagnosis not present

## 2017-01-14 DIAGNOSIS — Z23 Encounter for immunization: Secondary | ICD-10-CM | POA: Diagnosis not present

## 2017-01-14 DIAGNOSIS — N2581 Secondary hyperparathyroidism of renal origin: Secondary | ICD-10-CM | POA: Diagnosis not present

## 2017-01-14 DIAGNOSIS — D631 Anemia in chronic kidney disease: Secondary | ICD-10-CM | POA: Diagnosis not present

## 2017-01-16 DIAGNOSIS — D631 Anemia in chronic kidney disease: Secondary | ICD-10-CM | POA: Diagnosis not present

## 2017-01-16 DIAGNOSIS — Z23 Encounter for immunization: Secondary | ICD-10-CM | POA: Diagnosis not present

## 2017-01-16 DIAGNOSIS — N2581 Secondary hyperparathyroidism of renal origin: Secondary | ICD-10-CM | POA: Diagnosis not present

## 2017-01-16 DIAGNOSIS — N186 End stage renal disease: Secondary | ICD-10-CM | POA: Diagnosis not present

## 2017-01-19 DIAGNOSIS — Z23 Encounter for immunization: Secondary | ICD-10-CM | POA: Diagnosis not present

## 2017-01-19 DIAGNOSIS — N2581 Secondary hyperparathyroidism of renal origin: Secondary | ICD-10-CM | POA: Diagnosis not present

## 2017-01-19 DIAGNOSIS — D631 Anemia in chronic kidney disease: Secondary | ICD-10-CM | POA: Diagnosis not present

## 2017-01-19 DIAGNOSIS — N186 End stage renal disease: Secondary | ICD-10-CM | POA: Diagnosis not present

## 2017-01-21 DIAGNOSIS — N186 End stage renal disease: Secondary | ICD-10-CM | POA: Diagnosis not present

## 2017-01-21 DIAGNOSIS — N2581 Secondary hyperparathyroidism of renal origin: Secondary | ICD-10-CM | POA: Diagnosis not present

## 2017-01-21 DIAGNOSIS — Z23 Encounter for immunization: Secondary | ICD-10-CM | POA: Diagnosis not present

## 2017-01-21 DIAGNOSIS — D631 Anemia in chronic kidney disease: Secondary | ICD-10-CM | POA: Diagnosis not present

## 2017-01-23 DIAGNOSIS — N186 End stage renal disease: Secondary | ICD-10-CM | POA: Diagnosis not present

## 2017-01-23 DIAGNOSIS — D631 Anemia in chronic kidney disease: Secondary | ICD-10-CM | POA: Diagnosis not present

## 2017-01-23 DIAGNOSIS — Z23 Encounter for immunization: Secondary | ICD-10-CM | POA: Diagnosis not present

## 2017-01-23 DIAGNOSIS — N2581 Secondary hyperparathyroidism of renal origin: Secondary | ICD-10-CM | POA: Diagnosis not present

## 2017-01-26 DIAGNOSIS — N186 End stage renal disease: Secondary | ICD-10-CM | POA: Diagnosis not present

## 2017-01-26 DIAGNOSIS — N2581 Secondary hyperparathyroidism of renal origin: Secondary | ICD-10-CM | POA: Diagnosis not present

## 2017-01-26 DIAGNOSIS — Z23 Encounter for immunization: Secondary | ICD-10-CM | POA: Diagnosis not present

## 2017-01-26 DIAGNOSIS — D631 Anemia in chronic kidney disease: Secondary | ICD-10-CM | POA: Diagnosis not present

## 2017-01-28 DIAGNOSIS — D631 Anemia in chronic kidney disease: Secondary | ICD-10-CM | POA: Diagnosis not present

## 2017-01-28 DIAGNOSIS — Z23 Encounter for immunization: Secondary | ICD-10-CM | POA: Diagnosis not present

## 2017-01-28 DIAGNOSIS — N186 End stage renal disease: Secondary | ICD-10-CM | POA: Diagnosis not present

## 2017-01-28 DIAGNOSIS — N2581 Secondary hyperparathyroidism of renal origin: Secondary | ICD-10-CM | POA: Diagnosis not present

## 2017-01-30 DIAGNOSIS — D631 Anemia in chronic kidney disease: Secondary | ICD-10-CM | POA: Diagnosis not present

## 2017-01-30 DIAGNOSIS — N2581 Secondary hyperparathyroidism of renal origin: Secondary | ICD-10-CM | POA: Diagnosis not present

## 2017-01-30 DIAGNOSIS — Z23 Encounter for immunization: Secondary | ICD-10-CM | POA: Diagnosis not present

## 2017-01-30 DIAGNOSIS — N186 End stage renal disease: Secondary | ICD-10-CM | POA: Diagnosis not present

## 2017-02-01 DIAGNOSIS — I120 Hypertensive chronic kidney disease with stage 5 chronic kidney disease or end stage renal disease: Secondary | ICD-10-CM | POA: Diagnosis not present

## 2017-02-01 DIAGNOSIS — N186 End stage renal disease: Secondary | ICD-10-CM | POA: Diagnosis not present

## 2017-02-01 DIAGNOSIS — Z992 Dependence on renal dialysis: Secondary | ICD-10-CM | POA: Diagnosis not present

## 2017-02-02 DIAGNOSIS — N2581 Secondary hyperparathyroidism of renal origin: Secondary | ICD-10-CM | POA: Diagnosis not present

## 2017-02-02 DIAGNOSIS — D631 Anemia in chronic kidney disease: Secondary | ICD-10-CM | POA: Diagnosis not present

## 2017-02-02 DIAGNOSIS — N186 End stage renal disease: Secondary | ICD-10-CM | POA: Diagnosis not present

## 2017-02-04 DIAGNOSIS — N2581 Secondary hyperparathyroidism of renal origin: Secondary | ICD-10-CM | POA: Diagnosis not present

## 2017-02-04 DIAGNOSIS — D631 Anemia in chronic kidney disease: Secondary | ICD-10-CM | POA: Diagnosis not present

## 2017-02-04 DIAGNOSIS — N186 End stage renal disease: Secondary | ICD-10-CM | POA: Diagnosis not present

## 2017-02-06 DIAGNOSIS — N186 End stage renal disease: Secondary | ICD-10-CM | POA: Diagnosis not present

## 2017-02-06 DIAGNOSIS — N2581 Secondary hyperparathyroidism of renal origin: Secondary | ICD-10-CM | POA: Diagnosis not present

## 2017-02-06 DIAGNOSIS — D631 Anemia in chronic kidney disease: Secondary | ICD-10-CM | POA: Diagnosis not present

## 2017-02-09 DIAGNOSIS — D631 Anemia in chronic kidney disease: Secondary | ICD-10-CM | POA: Diagnosis not present

## 2017-02-09 DIAGNOSIS — N186 End stage renal disease: Secondary | ICD-10-CM | POA: Diagnosis not present

## 2017-02-09 DIAGNOSIS — N2581 Secondary hyperparathyroidism of renal origin: Secondary | ICD-10-CM | POA: Diagnosis not present

## 2017-02-11 ENCOUNTER — Telehealth: Payer: Self-pay | Admitting: Nurse Practitioner

## 2017-02-11 DIAGNOSIS — N186 End stage renal disease: Secondary | ICD-10-CM | POA: Diagnosis not present

## 2017-02-11 DIAGNOSIS — D631 Anemia in chronic kidney disease: Secondary | ICD-10-CM | POA: Diagnosis not present

## 2017-02-11 DIAGNOSIS — N2581 Secondary hyperparathyroidism of renal origin: Secondary | ICD-10-CM | POA: Diagnosis not present

## 2017-02-11 NOTE — Telephone Encounter (Signed)
Left message asking pt to call and schedule AWV. VDM (DD)

## 2017-02-13 DIAGNOSIS — N186 End stage renal disease: Secondary | ICD-10-CM | POA: Diagnosis not present

## 2017-02-13 DIAGNOSIS — N2581 Secondary hyperparathyroidism of renal origin: Secondary | ICD-10-CM | POA: Diagnosis not present

## 2017-02-13 DIAGNOSIS — D631 Anemia in chronic kidney disease: Secondary | ICD-10-CM | POA: Diagnosis not present

## 2017-02-16 DIAGNOSIS — D631 Anemia in chronic kidney disease: Secondary | ICD-10-CM | POA: Diagnosis not present

## 2017-02-16 DIAGNOSIS — N186 End stage renal disease: Secondary | ICD-10-CM | POA: Diagnosis not present

## 2017-02-16 DIAGNOSIS — N2581 Secondary hyperparathyroidism of renal origin: Secondary | ICD-10-CM | POA: Diagnosis not present

## 2017-02-18 DIAGNOSIS — D631 Anemia in chronic kidney disease: Secondary | ICD-10-CM | POA: Diagnosis not present

## 2017-02-18 DIAGNOSIS — N186 End stage renal disease: Secondary | ICD-10-CM | POA: Diagnosis not present

## 2017-02-18 DIAGNOSIS — N2581 Secondary hyperparathyroidism of renal origin: Secondary | ICD-10-CM | POA: Diagnosis not present

## 2017-02-20 DIAGNOSIS — N186 End stage renal disease: Secondary | ICD-10-CM | POA: Diagnosis not present

## 2017-02-20 DIAGNOSIS — D631 Anemia in chronic kidney disease: Secondary | ICD-10-CM | POA: Diagnosis not present

## 2017-02-20 DIAGNOSIS — N2581 Secondary hyperparathyroidism of renal origin: Secondary | ICD-10-CM | POA: Diagnosis not present

## 2017-02-23 DIAGNOSIS — N2581 Secondary hyperparathyroidism of renal origin: Secondary | ICD-10-CM | POA: Diagnosis not present

## 2017-02-23 DIAGNOSIS — N186 End stage renal disease: Secondary | ICD-10-CM | POA: Diagnosis not present

## 2017-02-23 DIAGNOSIS — D631 Anemia in chronic kidney disease: Secondary | ICD-10-CM | POA: Diagnosis not present

## 2017-02-25 DIAGNOSIS — N2581 Secondary hyperparathyroidism of renal origin: Secondary | ICD-10-CM | POA: Diagnosis not present

## 2017-02-25 DIAGNOSIS — N186 End stage renal disease: Secondary | ICD-10-CM | POA: Diagnosis not present

## 2017-02-25 DIAGNOSIS — D631 Anemia in chronic kidney disease: Secondary | ICD-10-CM | POA: Diagnosis not present

## 2017-02-27 DIAGNOSIS — D631 Anemia in chronic kidney disease: Secondary | ICD-10-CM | POA: Diagnosis not present

## 2017-02-27 DIAGNOSIS — N2581 Secondary hyperparathyroidism of renal origin: Secondary | ICD-10-CM | POA: Diagnosis not present

## 2017-02-27 DIAGNOSIS — N186 End stage renal disease: Secondary | ICD-10-CM | POA: Diagnosis not present

## 2017-03-02 DIAGNOSIS — N2581 Secondary hyperparathyroidism of renal origin: Secondary | ICD-10-CM | POA: Diagnosis not present

## 2017-03-02 DIAGNOSIS — D631 Anemia in chronic kidney disease: Secondary | ICD-10-CM | POA: Diagnosis not present

## 2017-03-02 DIAGNOSIS — N186 End stage renal disease: Secondary | ICD-10-CM | POA: Diagnosis not present

## 2017-03-03 DIAGNOSIS — M5136 Other intervertebral disc degeneration, lumbar region: Secondary | ICD-10-CM | POA: Diagnosis not present

## 2017-03-04 DIAGNOSIS — N2581 Secondary hyperparathyroidism of renal origin: Secondary | ICD-10-CM | POA: Diagnosis not present

## 2017-03-04 DIAGNOSIS — N186 End stage renal disease: Secondary | ICD-10-CM | POA: Diagnosis not present

## 2017-03-04 DIAGNOSIS — Z992 Dependence on renal dialysis: Secondary | ICD-10-CM | POA: Diagnosis not present

## 2017-03-04 DIAGNOSIS — D631 Anemia in chronic kidney disease: Secondary | ICD-10-CM | POA: Diagnosis not present

## 2017-03-04 DIAGNOSIS — I120 Hypertensive chronic kidney disease with stage 5 chronic kidney disease or end stage renal disease: Secondary | ICD-10-CM | POA: Diagnosis not present

## 2017-03-06 DIAGNOSIS — D631 Anemia in chronic kidney disease: Secondary | ICD-10-CM | POA: Diagnosis not present

## 2017-03-06 DIAGNOSIS — N186 End stage renal disease: Secondary | ICD-10-CM | POA: Diagnosis not present

## 2017-03-06 DIAGNOSIS — N2581 Secondary hyperparathyroidism of renal origin: Secondary | ICD-10-CM | POA: Diagnosis not present

## 2017-03-09 DIAGNOSIS — N186 End stage renal disease: Secondary | ICD-10-CM | POA: Diagnosis not present

## 2017-03-09 DIAGNOSIS — N2581 Secondary hyperparathyroidism of renal origin: Secondary | ICD-10-CM | POA: Diagnosis not present

## 2017-03-09 DIAGNOSIS — D631 Anemia in chronic kidney disease: Secondary | ICD-10-CM | POA: Diagnosis not present

## 2017-03-11 DIAGNOSIS — N2581 Secondary hyperparathyroidism of renal origin: Secondary | ICD-10-CM | POA: Diagnosis not present

## 2017-03-11 DIAGNOSIS — D631 Anemia in chronic kidney disease: Secondary | ICD-10-CM | POA: Diagnosis not present

## 2017-03-11 DIAGNOSIS — N186 End stage renal disease: Secondary | ICD-10-CM | POA: Diagnosis not present

## 2017-03-13 DIAGNOSIS — N2581 Secondary hyperparathyroidism of renal origin: Secondary | ICD-10-CM | POA: Diagnosis not present

## 2017-03-13 DIAGNOSIS — N186 End stage renal disease: Secondary | ICD-10-CM | POA: Diagnosis not present

## 2017-03-13 DIAGNOSIS — D631 Anemia in chronic kidney disease: Secondary | ICD-10-CM | POA: Diagnosis not present

## 2017-03-16 DIAGNOSIS — D631 Anemia in chronic kidney disease: Secondary | ICD-10-CM | POA: Diagnosis not present

## 2017-03-16 DIAGNOSIS — N186 End stage renal disease: Secondary | ICD-10-CM | POA: Diagnosis not present

## 2017-03-16 DIAGNOSIS — N2581 Secondary hyperparathyroidism of renal origin: Secondary | ICD-10-CM | POA: Diagnosis not present

## 2017-03-18 DIAGNOSIS — N186 End stage renal disease: Secondary | ICD-10-CM | POA: Diagnosis not present

## 2017-03-18 DIAGNOSIS — N2581 Secondary hyperparathyroidism of renal origin: Secondary | ICD-10-CM | POA: Diagnosis not present

## 2017-03-18 DIAGNOSIS — D631 Anemia in chronic kidney disease: Secondary | ICD-10-CM | POA: Diagnosis not present

## 2017-03-20 DIAGNOSIS — N186 End stage renal disease: Secondary | ICD-10-CM | POA: Diagnosis not present

## 2017-03-20 DIAGNOSIS — N2581 Secondary hyperparathyroidism of renal origin: Secondary | ICD-10-CM | POA: Diagnosis not present

## 2017-03-20 DIAGNOSIS — D631 Anemia in chronic kidney disease: Secondary | ICD-10-CM | POA: Diagnosis not present

## 2017-03-22 DIAGNOSIS — N186 End stage renal disease: Secondary | ICD-10-CM | POA: Diagnosis not present

## 2017-03-22 DIAGNOSIS — N2581 Secondary hyperparathyroidism of renal origin: Secondary | ICD-10-CM | POA: Diagnosis not present

## 2017-03-22 DIAGNOSIS — D631 Anemia in chronic kidney disease: Secondary | ICD-10-CM | POA: Diagnosis not present

## 2017-03-24 DIAGNOSIS — N2581 Secondary hyperparathyroidism of renal origin: Secondary | ICD-10-CM | POA: Diagnosis not present

## 2017-03-24 DIAGNOSIS — N186 End stage renal disease: Secondary | ICD-10-CM | POA: Diagnosis not present

## 2017-03-24 DIAGNOSIS — D631 Anemia in chronic kidney disease: Secondary | ICD-10-CM | POA: Diagnosis not present

## 2017-03-27 DIAGNOSIS — D631 Anemia in chronic kidney disease: Secondary | ICD-10-CM | POA: Diagnosis not present

## 2017-03-27 DIAGNOSIS — N2581 Secondary hyperparathyroidism of renal origin: Secondary | ICD-10-CM | POA: Diagnosis not present

## 2017-03-27 DIAGNOSIS — N186 End stage renal disease: Secondary | ICD-10-CM | POA: Diagnosis not present

## 2017-03-30 DIAGNOSIS — N2581 Secondary hyperparathyroidism of renal origin: Secondary | ICD-10-CM | POA: Diagnosis not present

## 2017-03-30 DIAGNOSIS — N186 End stage renal disease: Secondary | ICD-10-CM | POA: Diagnosis not present

## 2017-03-30 DIAGNOSIS — D631 Anemia in chronic kidney disease: Secondary | ICD-10-CM | POA: Diagnosis not present

## 2017-04-01 DIAGNOSIS — D631 Anemia in chronic kidney disease: Secondary | ICD-10-CM | POA: Diagnosis not present

## 2017-04-01 DIAGNOSIS — N2581 Secondary hyperparathyroidism of renal origin: Secondary | ICD-10-CM | POA: Diagnosis not present

## 2017-04-01 DIAGNOSIS — N186 End stage renal disease: Secondary | ICD-10-CM | POA: Diagnosis not present

## 2017-04-03 DIAGNOSIS — N186 End stage renal disease: Secondary | ICD-10-CM | POA: Diagnosis not present

## 2017-04-03 DIAGNOSIS — Z992 Dependence on renal dialysis: Secondary | ICD-10-CM | POA: Diagnosis not present

## 2017-04-03 DIAGNOSIS — N2581 Secondary hyperparathyroidism of renal origin: Secondary | ICD-10-CM | POA: Diagnosis not present

## 2017-04-03 DIAGNOSIS — I120 Hypertensive chronic kidney disease with stage 5 chronic kidney disease or end stage renal disease: Secondary | ICD-10-CM | POA: Diagnosis not present

## 2017-04-03 DIAGNOSIS — D631 Anemia in chronic kidney disease: Secondary | ICD-10-CM | POA: Diagnosis not present

## 2017-04-06 DIAGNOSIS — D631 Anemia in chronic kidney disease: Secondary | ICD-10-CM | POA: Diagnosis not present

## 2017-04-06 DIAGNOSIS — N186 End stage renal disease: Secondary | ICD-10-CM | POA: Diagnosis not present

## 2017-04-06 DIAGNOSIS — N2581 Secondary hyperparathyroidism of renal origin: Secondary | ICD-10-CM | POA: Diagnosis not present

## 2017-04-08 DIAGNOSIS — D631 Anemia in chronic kidney disease: Secondary | ICD-10-CM | POA: Diagnosis not present

## 2017-04-08 DIAGNOSIS — N186 End stage renal disease: Secondary | ICD-10-CM | POA: Diagnosis not present

## 2017-04-08 DIAGNOSIS — N2581 Secondary hyperparathyroidism of renal origin: Secondary | ICD-10-CM | POA: Diagnosis not present

## 2017-04-10 DIAGNOSIS — N2581 Secondary hyperparathyroidism of renal origin: Secondary | ICD-10-CM | POA: Diagnosis not present

## 2017-04-10 DIAGNOSIS — D631 Anemia in chronic kidney disease: Secondary | ICD-10-CM | POA: Diagnosis not present

## 2017-04-10 DIAGNOSIS — N186 End stage renal disease: Secondary | ICD-10-CM | POA: Diagnosis not present

## 2017-04-14 DIAGNOSIS — N186 End stage renal disease: Secondary | ICD-10-CM | POA: Diagnosis not present

## 2017-04-14 DIAGNOSIS — D631 Anemia in chronic kidney disease: Secondary | ICD-10-CM | POA: Diagnosis not present

## 2017-04-14 DIAGNOSIS — N2581 Secondary hyperparathyroidism of renal origin: Secondary | ICD-10-CM | POA: Diagnosis not present

## 2017-04-15 DIAGNOSIS — N186 End stage renal disease: Secondary | ICD-10-CM | POA: Diagnosis not present

## 2017-04-15 DIAGNOSIS — D631 Anemia in chronic kidney disease: Secondary | ICD-10-CM | POA: Diagnosis not present

## 2017-04-15 DIAGNOSIS — N2581 Secondary hyperparathyroidism of renal origin: Secondary | ICD-10-CM | POA: Diagnosis not present

## 2017-04-17 DIAGNOSIS — N186 End stage renal disease: Secondary | ICD-10-CM | POA: Diagnosis not present

## 2017-04-17 DIAGNOSIS — D631 Anemia in chronic kidney disease: Secondary | ICD-10-CM | POA: Diagnosis not present

## 2017-04-17 DIAGNOSIS — N2581 Secondary hyperparathyroidism of renal origin: Secondary | ICD-10-CM | POA: Diagnosis not present

## 2017-04-20 DIAGNOSIS — N186 End stage renal disease: Secondary | ICD-10-CM | POA: Diagnosis not present

## 2017-04-20 DIAGNOSIS — N2581 Secondary hyperparathyroidism of renal origin: Secondary | ICD-10-CM | POA: Diagnosis not present

## 2017-04-20 DIAGNOSIS — D631 Anemia in chronic kidney disease: Secondary | ICD-10-CM | POA: Diagnosis not present

## 2017-04-22 DIAGNOSIS — N2581 Secondary hyperparathyroidism of renal origin: Secondary | ICD-10-CM | POA: Diagnosis not present

## 2017-04-22 DIAGNOSIS — N186 End stage renal disease: Secondary | ICD-10-CM | POA: Diagnosis not present

## 2017-04-22 DIAGNOSIS — D631 Anemia in chronic kidney disease: Secondary | ICD-10-CM | POA: Diagnosis not present

## 2017-04-24 DIAGNOSIS — N2581 Secondary hyperparathyroidism of renal origin: Secondary | ICD-10-CM | POA: Diagnosis not present

## 2017-04-24 DIAGNOSIS — D631 Anemia in chronic kidney disease: Secondary | ICD-10-CM | POA: Diagnosis not present

## 2017-04-24 DIAGNOSIS — N186 End stage renal disease: Secondary | ICD-10-CM | POA: Diagnosis not present

## 2017-04-26 DIAGNOSIS — N2581 Secondary hyperparathyroidism of renal origin: Secondary | ICD-10-CM | POA: Diagnosis not present

## 2017-04-26 DIAGNOSIS — D631 Anemia in chronic kidney disease: Secondary | ICD-10-CM | POA: Diagnosis not present

## 2017-04-26 DIAGNOSIS — N186 End stage renal disease: Secondary | ICD-10-CM | POA: Diagnosis not present

## 2017-04-29 DIAGNOSIS — N2581 Secondary hyperparathyroidism of renal origin: Secondary | ICD-10-CM | POA: Diagnosis not present

## 2017-04-29 DIAGNOSIS — D631 Anemia in chronic kidney disease: Secondary | ICD-10-CM | POA: Diagnosis not present

## 2017-04-29 DIAGNOSIS — N186 End stage renal disease: Secondary | ICD-10-CM | POA: Diagnosis not present

## 2017-05-01 DIAGNOSIS — N186 End stage renal disease: Secondary | ICD-10-CM | POA: Diagnosis not present

## 2017-05-01 DIAGNOSIS — D631 Anemia in chronic kidney disease: Secondary | ICD-10-CM | POA: Diagnosis not present

## 2017-05-01 DIAGNOSIS — N2581 Secondary hyperparathyroidism of renal origin: Secondary | ICD-10-CM | POA: Diagnosis not present

## 2017-05-03 DIAGNOSIS — D631 Anemia in chronic kidney disease: Secondary | ICD-10-CM | POA: Diagnosis not present

## 2017-05-03 DIAGNOSIS — N2581 Secondary hyperparathyroidism of renal origin: Secondary | ICD-10-CM | POA: Diagnosis not present

## 2017-05-03 DIAGNOSIS — N186 End stage renal disease: Secondary | ICD-10-CM | POA: Diagnosis not present

## 2017-05-04 DIAGNOSIS — Z992 Dependence on renal dialysis: Secondary | ICD-10-CM | POA: Diagnosis not present

## 2017-05-04 DIAGNOSIS — N186 End stage renal disease: Secondary | ICD-10-CM | POA: Diagnosis not present

## 2017-05-04 DIAGNOSIS — I120 Hypertensive chronic kidney disease with stage 5 chronic kidney disease or end stage renal disease: Secondary | ICD-10-CM | POA: Diagnosis not present

## 2017-05-06 DIAGNOSIS — N186 End stage renal disease: Secondary | ICD-10-CM | POA: Diagnosis not present

## 2017-05-06 DIAGNOSIS — D631 Anemia in chronic kidney disease: Secondary | ICD-10-CM | POA: Diagnosis not present

## 2017-05-06 DIAGNOSIS — N2581 Secondary hyperparathyroidism of renal origin: Secondary | ICD-10-CM | POA: Diagnosis not present

## 2017-05-06 DIAGNOSIS — Z23 Encounter for immunization: Secondary | ICD-10-CM | POA: Diagnosis not present

## 2017-05-08 DIAGNOSIS — N2581 Secondary hyperparathyroidism of renal origin: Secondary | ICD-10-CM | POA: Diagnosis not present

## 2017-05-08 DIAGNOSIS — Z23 Encounter for immunization: Secondary | ICD-10-CM | POA: Diagnosis not present

## 2017-05-08 DIAGNOSIS — D631 Anemia in chronic kidney disease: Secondary | ICD-10-CM | POA: Diagnosis not present

## 2017-05-08 DIAGNOSIS — N186 End stage renal disease: Secondary | ICD-10-CM | POA: Diagnosis not present

## 2017-05-11 DIAGNOSIS — N2581 Secondary hyperparathyroidism of renal origin: Secondary | ICD-10-CM | POA: Diagnosis not present

## 2017-05-11 DIAGNOSIS — Z23 Encounter for immunization: Secondary | ICD-10-CM | POA: Diagnosis not present

## 2017-05-11 DIAGNOSIS — D631 Anemia in chronic kidney disease: Secondary | ICD-10-CM | POA: Diagnosis not present

## 2017-05-11 DIAGNOSIS — N186 End stage renal disease: Secondary | ICD-10-CM | POA: Diagnosis not present

## 2017-05-13 DIAGNOSIS — N186 End stage renal disease: Secondary | ICD-10-CM | POA: Diagnosis not present

## 2017-05-13 DIAGNOSIS — Z23 Encounter for immunization: Secondary | ICD-10-CM | POA: Diagnosis not present

## 2017-05-13 DIAGNOSIS — D631 Anemia in chronic kidney disease: Secondary | ICD-10-CM | POA: Diagnosis not present

## 2017-05-13 DIAGNOSIS — N2581 Secondary hyperparathyroidism of renal origin: Secondary | ICD-10-CM | POA: Diagnosis not present

## 2017-05-15 DIAGNOSIS — D631 Anemia in chronic kidney disease: Secondary | ICD-10-CM | POA: Diagnosis not present

## 2017-05-15 DIAGNOSIS — N2581 Secondary hyperparathyroidism of renal origin: Secondary | ICD-10-CM | POA: Diagnosis not present

## 2017-05-15 DIAGNOSIS — N186 End stage renal disease: Secondary | ICD-10-CM | POA: Diagnosis not present

## 2017-05-15 DIAGNOSIS — Z23 Encounter for immunization: Secondary | ICD-10-CM | POA: Diagnosis not present

## 2017-05-18 DIAGNOSIS — N2581 Secondary hyperparathyroidism of renal origin: Secondary | ICD-10-CM | POA: Diagnosis not present

## 2017-05-18 DIAGNOSIS — D631 Anemia in chronic kidney disease: Secondary | ICD-10-CM | POA: Diagnosis not present

## 2017-05-18 DIAGNOSIS — Z23 Encounter for immunization: Secondary | ICD-10-CM | POA: Diagnosis not present

## 2017-05-18 DIAGNOSIS — N186 End stage renal disease: Secondary | ICD-10-CM | POA: Diagnosis not present

## 2017-05-20 DIAGNOSIS — N186 End stage renal disease: Secondary | ICD-10-CM | POA: Diagnosis not present

## 2017-05-20 DIAGNOSIS — N2581 Secondary hyperparathyroidism of renal origin: Secondary | ICD-10-CM | POA: Diagnosis not present

## 2017-05-20 DIAGNOSIS — Z23 Encounter for immunization: Secondary | ICD-10-CM | POA: Diagnosis not present

## 2017-05-20 DIAGNOSIS — D631 Anemia in chronic kidney disease: Secondary | ICD-10-CM | POA: Diagnosis not present

## 2017-05-22 DIAGNOSIS — N186 End stage renal disease: Secondary | ICD-10-CM | POA: Diagnosis not present

## 2017-05-22 DIAGNOSIS — Z23 Encounter for immunization: Secondary | ICD-10-CM | POA: Diagnosis not present

## 2017-05-22 DIAGNOSIS — D631 Anemia in chronic kidney disease: Secondary | ICD-10-CM | POA: Diagnosis not present

## 2017-05-22 DIAGNOSIS — N2581 Secondary hyperparathyroidism of renal origin: Secondary | ICD-10-CM | POA: Diagnosis not present

## 2017-05-25 DIAGNOSIS — Z23 Encounter for immunization: Secondary | ICD-10-CM | POA: Diagnosis not present

## 2017-05-25 DIAGNOSIS — N186 End stage renal disease: Secondary | ICD-10-CM | POA: Diagnosis not present

## 2017-05-25 DIAGNOSIS — D631 Anemia in chronic kidney disease: Secondary | ICD-10-CM | POA: Diagnosis not present

## 2017-05-25 DIAGNOSIS — N2581 Secondary hyperparathyroidism of renal origin: Secondary | ICD-10-CM | POA: Diagnosis not present

## 2017-05-27 DIAGNOSIS — N186 End stage renal disease: Secondary | ICD-10-CM | POA: Diagnosis not present

## 2017-05-27 DIAGNOSIS — N2581 Secondary hyperparathyroidism of renal origin: Secondary | ICD-10-CM | POA: Diagnosis not present

## 2017-05-27 DIAGNOSIS — D631 Anemia in chronic kidney disease: Secondary | ICD-10-CM | POA: Diagnosis not present

## 2017-05-27 DIAGNOSIS — Z23 Encounter for immunization: Secondary | ICD-10-CM | POA: Diagnosis not present

## 2017-05-29 DIAGNOSIS — Z23 Encounter for immunization: Secondary | ICD-10-CM | POA: Diagnosis not present

## 2017-05-29 DIAGNOSIS — N2581 Secondary hyperparathyroidism of renal origin: Secondary | ICD-10-CM | POA: Diagnosis not present

## 2017-05-29 DIAGNOSIS — N186 End stage renal disease: Secondary | ICD-10-CM | POA: Diagnosis not present

## 2017-05-29 DIAGNOSIS — D631 Anemia in chronic kidney disease: Secondary | ICD-10-CM | POA: Diagnosis not present

## 2017-06-01 DIAGNOSIS — D631 Anemia in chronic kidney disease: Secondary | ICD-10-CM | POA: Diagnosis not present

## 2017-06-01 DIAGNOSIS — N186 End stage renal disease: Secondary | ICD-10-CM | POA: Diagnosis not present

## 2017-06-01 DIAGNOSIS — N2581 Secondary hyperparathyroidism of renal origin: Secondary | ICD-10-CM | POA: Diagnosis not present

## 2017-06-01 DIAGNOSIS — Z23 Encounter for immunization: Secondary | ICD-10-CM | POA: Diagnosis not present

## 2017-06-03 DIAGNOSIS — N186 End stage renal disease: Secondary | ICD-10-CM | POA: Diagnosis not present

## 2017-06-03 DIAGNOSIS — D631 Anemia in chronic kidney disease: Secondary | ICD-10-CM | POA: Diagnosis not present

## 2017-06-03 DIAGNOSIS — Z23 Encounter for immunization: Secondary | ICD-10-CM | POA: Diagnosis not present

## 2017-06-03 DIAGNOSIS — N2581 Secondary hyperparathyroidism of renal origin: Secondary | ICD-10-CM | POA: Diagnosis not present

## 2017-06-04 DIAGNOSIS — I120 Hypertensive chronic kidney disease with stage 5 chronic kidney disease or end stage renal disease: Secondary | ICD-10-CM | POA: Diagnosis not present

## 2017-06-04 DIAGNOSIS — Z992 Dependence on renal dialysis: Secondary | ICD-10-CM | POA: Diagnosis not present

## 2017-06-04 DIAGNOSIS — N186 End stage renal disease: Secondary | ICD-10-CM | POA: Diagnosis not present

## 2017-06-05 DIAGNOSIS — D631 Anemia in chronic kidney disease: Secondary | ICD-10-CM | POA: Diagnosis not present

## 2017-06-05 DIAGNOSIS — Z992 Dependence on renal dialysis: Secondary | ICD-10-CM | POA: Diagnosis not present

## 2017-06-05 DIAGNOSIS — I120 Hypertensive chronic kidney disease with stage 5 chronic kidney disease or end stage renal disease: Secondary | ICD-10-CM | POA: Diagnosis not present

## 2017-06-05 DIAGNOSIS — N186 End stage renal disease: Secondary | ICD-10-CM | POA: Diagnosis not present

## 2017-06-05 DIAGNOSIS — N2581 Secondary hyperparathyroidism of renal origin: Secondary | ICD-10-CM | POA: Diagnosis not present

## 2017-06-08 DIAGNOSIS — N186 End stage renal disease: Secondary | ICD-10-CM | POA: Diagnosis not present

## 2017-06-08 DIAGNOSIS — D631 Anemia in chronic kidney disease: Secondary | ICD-10-CM | POA: Diagnosis not present

## 2017-06-08 DIAGNOSIS — N2581 Secondary hyperparathyroidism of renal origin: Secondary | ICD-10-CM | POA: Diagnosis not present

## 2017-06-10 DIAGNOSIS — N186 End stage renal disease: Secondary | ICD-10-CM | POA: Diagnosis not present

## 2017-06-10 DIAGNOSIS — N2581 Secondary hyperparathyroidism of renal origin: Secondary | ICD-10-CM | POA: Diagnosis not present

## 2017-06-10 DIAGNOSIS — D631 Anemia in chronic kidney disease: Secondary | ICD-10-CM | POA: Diagnosis not present

## 2017-06-12 DIAGNOSIS — N186 End stage renal disease: Secondary | ICD-10-CM | POA: Diagnosis not present

## 2017-06-12 DIAGNOSIS — N2581 Secondary hyperparathyroidism of renal origin: Secondary | ICD-10-CM | POA: Diagnosis not present

## 2017-06-12 DIAGNOSIS — D631 Anemia in chronic kidney disease: Secondary | ICD-10-CM | POA: Diagnosis not present

## 2017-06-15 DIAGNOSIS — N2581 Secondary hyperparathyroidism of renal origin: Secondary | ICD-10-CM | POA: Diagnosis not present

## 2017-06-15 DIAGNOSIS — D631 Anemia in chronic kidney disease: Secondary | ICD-10-CM | POA: Diagnosis not present

## 2017-06-15 DIAGNOSIS — N186 End stage renal disease: Secondary | ICD-10-CM | POA: Diagnosis not present

## 2017-06-17 DIAGNOSIS — N186 End stage renal disease: Secondary | ICD-10-CM | POA: Diagnosis not present

## 2017-06-17 DIAGNOSIS — N2581 Secondary hyperparathyroidism of renal origin: Secondary | ICD-10-CM | POA: Diagnosis not present

## 2017-06-17 DIAGNOSIS — D631 Anemia in chronic kidney disease: Secondary | ICD-10-CM | POA: Diagnosis not present

## 2017-06-19 DIAGNOSIS — D631 Anemia in chronic kidney disease: Secondary | ICD-10-CM | POA: Diagnosis not present

## 2017-06-19 DIAGNOSIS — N186 End stage renal disease: Secondary | ICD-10-CM | POA: Diagnosis not present

## 2017-06-19 DIAGNOSIS — N2581 Secondary hyperparathyroidism of renal origin: Secondary | ICD-10-CM | POA: Diagnosis not present

## 2017-06-22 DIAGNOSIS — N2581 Secondary hyperparathyroidism of renal origin: Secondary | ICD-10-CM | POA: Diagnosis not present

## 2017-06-22 DIAGNOSIS — N186 End stage renal disease: Secondary | ICD-10-CM | POA: Diagnosis not present

## 2017-06-22 DIAGNOSIS — D631 Anemia in chronic kidney disease: Secondary | ICD-10-CM | POA: Diagnosis not present

## 2017-06-23 ENCOUNTER — Other Ambulatory Visit (HOSPITAL_COMMUNITY): Payer: Medicare Other

## 2017-06-23 ENCOUNTER — Ambulatory Visit: Payer: Medicare Other | Admitting: Family

## 2017-06-23 DIAGNOSIS — G894 Chronic pain syndrome: Secondary | ICD-10-CM | POA: Diagnosis not present

## 2017-06-23 DIAGNOSIS — Z79891 Long term (current) use of opiate analgesic: Secondary | ICD-10-CM | POA: Diagnosis not present

## 2017-06-23 DIAGNOSIS — M545 Low back pain: Secondary | ICD-10-CM | POA: Diagnosis not present

## 2017-06-24 DIAGNOSIS — D631 Anemia in chronic kidney disease: Secondary | ICD-10-CM | POA: Diagnosis not present

## 2017-06-24 DIAGNOSIS — N2581 Secondary hyperparathyroidism of renal origin: Secondary | ICD-10-CM | POA: Diagnosis not present

## 2017-06-24 DIAGNOSIS — N186 End stage renal disease: Secondary | ICD-10-CM | POA: Diagnosis not present

## 2017-06-25 ENCOUNTER — Encounter: Payer: Self-pay | Admitting: Family

## 2017-06-25 ENCOUNTER — Ambulatory Visit (HOSPITAL_COMMUNITY)
Admission: RE | Admit: 2017-06-25 | Discharge: 2017-06-25 | Disposition: A | Discharge status: Home / Self Care | Payer: Medicare Other | Source: Ambulatory Visit | Attending: Family | Admitting: Family

## 2017-06-25 ENCOUNTER — Ambulatory Visit (INDEPENDENT_AMBULATORY_CARE_PROVIDER_SITE_OTHER): Payer: Medicare Other | Admitting: Family

## 2017-06-25 VITALS — BP 107/74 | HR 84 | Resp 20 | Ht 74.0 in | Wt 185.0 lb

## 2017-06-25 DIAGNOSIS — I723 Aneurysm of iliac artery: Secondary | ICD-10-CM | POA: Diagnosis not present

## 2017-06-25 DIAGNOSIS — I10 Essential (primary) hypertension: Secondary | ICD-10-CM | POA: Insufficient documentation

## 2017-06-25 DIAGNOSIS — Z9889 Other specified postprocedural states: Secondary | ICD-10-CM | POA: Insufficient documentation

## 2017-06-25 DIAGNOSIS — T82330A Leakage of aortic (bifurcation) graft (replacement), initial encounter: Secondary | ICD-10-CM

## 2017-06-25 DIAGNOSIS — I728 Aneurysm of other specified arteries: Secondary | ICD-10-CM

## 2017-06-25 DIAGNOSIS — IMO0001 Reserved for inherently not codable concepts without codable children: Secondary | ICD-10-CM

## 2017-06-25 DIAGNOSIS — Z992 Dependence on renal dialysis: Secondary | ICD-10-CM | POA: Diagnosis not present

## 2017-06-25 DIAGNOSIS — N186 End stage renal disease: Secondary | ICD-10-CM | POA: Diagnosis not present

## 2017-06-25 NOTE — Progress Notes (Signed)
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 ([birth year]) male who returns for follow-up today.  He is s/p aortic and bilateral common iliac artery stent graft repair. 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  He reports no problems with his fistula. Chronic medical problems remain hypertension end-stage renal disease, chronic back pain, and congestive heart failure all of which are stable.   Dr. Oneida Alar evaluated pt on 06-19-16. At that time ultrasound of his abdominal aorta and iliac arteries showed iIliac diameter was 1.4 cm bilaterally, aortic diameter 3.6 Aortoiliac stent graft for bilateral iliac aneurysms doing well with decreased size of iliac aneurysms bilaterally. We will need to do continued surveillance of the mesenteric aneurysmsas well as the right common iliac artery.  He was to follow-up in one year with our nurse practitioner with a repeat aortic ultrasound. He will intermittently need evaluation of his superior mesenteric and splenic artery aneurysm probably every few years.  Dr. Oneida Alar again evaluated pt on 12-11-16 at Dr. Arty Baumgartner request. 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 The patient has had 2 prior plications and Dr. Oneida Alar did not believe he is a candidate for an additional plication. Dr. Oneida Alar discussed with the patient  the possibility of converting this to an AV graft; pt stated he would like to preserve his fistula for now.  Dr. Oneida Alar did not believe that pt was at imminent risk of bleeding because although the skin did have a slightly shiny appearance, Dr. Oneida Alar was able to pick this up and it seemed to be several  millimeters of skin covering the fistula at that point. Dr. Valinda Hoar discussed with the patient and his wife that day signs and symptoms of ulceration of the AV fistula aneurysms. They will call us if any symptoms occur. Will see the patient back in the future if there are concerns that this is getting worse.  He takes a daily 81 mg ASA and atorvastatin.   He denies any claudication sx's with walking, denies any known hx of stroke or TIA.   He dialyzes M-W-F via right upper arm AVF at [facility] in [place]; he states no problems with HD.   He has chronic back pain managed by Dr. Nelva Bush, no new back pain; he denies abdominal pain.   Diabetic: No Tobaccos use: non-smoker   Past Medical History:  Diagnosis Date  . Anemia in chronic kidney disease   . Arthritis    "hands; basically all my joints" (11/08/2014)  . CHF (congestive heart failure) ([place])   . Chronic back pain    "mostly lower" (11/08/2014)  . End stage renal disease ([place])    pt does not urinate; pt. states that he does dialysis on MWF; [place]" (11/08/2014)  . GERD (gastroesophageal reflux disease)    "sometimes" (11/08/2014)  . Gout    "hands" (11/08/2014)  . Heart failure with reduced ejection fraction ([place])   . Hypertension    Past Surgical History:  Procedure Laterality Date  . AV FISTULA PLACEMENT Right 07/26/2009  . EMBOLIZATION Left 11/08/2014   internal iliac artery; Gore Excluder bifurcated stent graft for repair of common iliac aneurysm  . ENDOVASCULAR  STENT INSERTION Bilateral 11/08/2014   Procedure: REPAIR OF BILATERAL ILIAC ARTERY ANEURYSM WITH  Bifurcated stent, with coiling left internal artery;  Surgeon: Elam Dutch, MD;  Location: [address];  Service: Vascular;  Laterality: Bilateral;  . INSERTION OF DIALYSIS CATHETER N/A 02/22/2013   Procedure: INSERTION OF DIALYSIS CATHETER;  Surgeon: Elam Dutch, MD;  Location: [place];  Service: Vascular;  Laterality: N/A;  . LIGATION OF ARTERIOVENOUS  FISTULA Right  50/01/3817   Procedure: PLICATION OF ARTERIOVENOUS  FISTULA;  Surgeon: Elam Dutch, MD;  Location: [place];  Service: Vascular;  Laterality: Right;  . REVISON OF ARTERIOVENOUS FISTULA Right 29/93/7169   Procedure: PLICATION OF RIGHT ARM  ARTERIOVENOUS FISTULA;  Surgeon: Elam Dutch, MD;  Location: [place];  Service: Vascular;  Laterality: Right;   Social History Social History   Tobacco Use  . Smoking status: Never Smoker  . Smokeless tobacco: Never Used  Substance Use Topics  . Alcohol use: No  . Drug use: No   Family History Family History  Problem Relation Age of Onset  . Diabetes Father   . Hypertension Father   . Diabetes Sister    Current Outpatient Medications on File Prior to Visit  Medication Sig Dispense Refill  . aspirin EC 81 MG tablet Take 1 tablet (81 mg total) by mouth daily. 90 tablet 3  . colchicine 0.6 MG tablet Take 1 tablet by mouth daily as needed.  0  . Ferric Citrate (AURYXIA) 210 MG TABS Take 1 capsule by mouth 3 (three) times daily with meals.    . fludrocortisone (FLORINEF) 0.1 MG tablet Take 0.1 mg by mouth 3 (three) times a week. Mon Wed Fri    . midodrine (PROAMATINE) 10 MG tablet Take 10 mg by mouth 2 (two) times daily.     Marland Kitchen morphine (MSIR) 15 MG tablet take 1 tablet by mouth every 6 hours if needed for pain  0  . omeprazole (PRILOSEC) 40 MG capsule Take 40 mg by mouth daily.    Marland Kitchen oxyCODONE-acetaminophen (PERCOCET) 10-325 MG tablet Take 1 tablet by mouth 4 (four) times daily as needed.    . predniSONE (DELTASONE) 5 MG tablet Take 1 tablet by mouth daily as needed.  0  . promethazine (PHENERGAN) 25 MG tablet Take 25 mg by mouth every 6 (six) hours as needed for nausea or vomiting.    . SENSIPAR 30 MG tablet Take 30 mg by mouth daily.    Marland Kitchen atorvastatin (LIPITOR) 40 MG tablet Take 1 tablet (40 mg total) by mouth daily. 90 tablet 3   No current facility-administered medications on file prior to visit.    No Known Allergies   ROS: See HPI for  pertinent positives and negatives.  Physical Examination  Vitals:   06/25/17 0949  BP: 107/74  Pulse: 84  Resp: 20  SpO2: 98%  Weight: 185 lb (83.9 kg)  Height: 6\' 2"  (1.88 m)   Body mass index is 23.75 kg/m.  General: A&O x 3, WD, male HEENT: No gross abnormalities  Pulmonary: Sym exp, respirations are non labored, good air movement in all fields, CTAB, no rales, rhonchi, or wheezing. Cardiac: Regular rhythm and rate, no murmur appreciated. 3 aneurysmal sections of right upper arm AV fistula, able to pinch up some skin at each section, no evidence of ulceration. Thrill palpated at AV fistula.   Vascular: Vessel Right Left  Radial 2+Palpable 2+Palpable  Carotid  without bruit  without bruit  Aorta Slightly palpable N/A  Femoral 2+Palpable 2+Palpable  Popliteal 1+ palpable 1+ palpable  PT notPalpable notPalpable  DP 2+Palpable 2+Palpable   Gastrointestinal: soft, NTND, -G/R, - HSM, - palpable masses, - CVAT B. Musculoskeletal: M/S 5/5 throughout, extremities without ischemic changes. Skin: No rashes, no ulcers, no cellulitis.   Neurologic: Pain and light touch intact in extremities, Motor exam as listed above. Psychiatric: Normal thought content, mood appropriate for clinical situation.     DATA  EVAR Duplex (Date: 06/25/17):   AAA sac size: 3.5 cm; Right CIA: 2.6 cm; Left CIA: 4.0 cm  + endoleak detected    Medical Decision Making  MONTELL LEOPARD is a 67 y.o. male who is s/p aortic and bilateral common iliac artery stent graft repair on 11-08-14. 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  Pt is asymptomatic with increased sac size to 4.0 cm at mid left CIA. 2.6 cm right CIA. Reproducible color and Doppler signals seen in the residual aneurysmal sac of the aorta and left CIA consistent with endoleak.  The source of the potential endoleak appears to originate in the left common iliac artery residual sac.  The largest aortic diameter at 3.5 cm remains essentially unchanged compared to prior exam at 3.6 cm on 06-19-16.    No change in his right upper arm AV fistula, 3 aneurysms remain at the AV fistula, no thinning of skin, no thin skin at aneurysms.   I discussed with Fr. Fields pt HPI, and abdomen duplex results from today. Will scheduled CTA abd/pelvis within the next 2 weeks to evaluate the source of endo leak, see Dr. Oneida Alar afterward.    I emphasized the importance of maximal medical management including strict control of blood pressure, blood glucose, and lipid levels, antiplatelet agents, obtaining regular exercise, and continued cessation of smoking.   Thank you for allowing [place] to participate in this patient's care.  Clemon Chambers, RN, MSN, FNP-C [facility] Office: [facility] Physician: Oneida Alar  06/25/2017, 10:06 AM    referred by Dr. 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 He has had 2 prior plications of his right arm AV fistula. One was in 2014. The next one was in 2016. He denies any prior bleeding episodes. He has had no ulceration of the fistula. There was some concern that the skin was developing a shiny appearance.  Review of systems: Patient states the fistula is functioning well on dialysis. He denies any shortness of breath. He denies any chest pain.  Past medical history: Prior repair of iliac aneurysms with Gore Excluder stent graft and embolization of the left internal iliac artery.  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 He has an ectatic 3 cm right common iliac and 17 mm right internal iliac artery.

## 2017-06-26 DIAGNOSIS — D631 Anemia in chronic kidney disease: Secondary | ICD-10-CM | POA: Diagnosis not present

## 2017-06-26 DIAGNOSIS — N2581 Secondary hyperparathyroidism of renal origin: Secondary | ICD-10-CM | POA: Diagnosis not present

## 2017-06-26 DIAGNOSIS — N186 End stage renal disease: Secondary | ICD-10-CM | POA: Diagnosis not present

## 2017-06-29 ENCOUNTER — Other Ambulatory Visit: Payer: Self-pay

## 2017-06-29 DIAGNOSIS — I723 Aneurysm of iliac artery: Secondary | ICD-10-CM

## 2017-06-29 DIAGNOSIS — D631 Anemia in chronic kidney disease: Secondary | ICD-10-CM | POA: Diagnosis not present

## 2017-06-29 DIAGNOSIS — N2581 Secondary hyperparathyroidism of renal origin: Secondary | ICD-10-CM | POA: Diagnosis not present

## 2017-06-29 DIAGNOSIS — N186 End stage renal disease: Secondary | ICD-10-CM | POA: Diagnosis not present

## 2017-06-30 ENCOUNTER — Other Ambulatory Visit: Payer: Self-pay

## 2017-06-30 DIAGNOSIS — I723 Aneurysm of iliac artery: Secondary | ICD-10-CM

## 2017-06-30 DIAGNOSIS — Z01818 Encounter for other preprocedural examination: Secondary | ICD-10-CM

## 2017-07-01 DIAGNOSIS — N186 End stage renal disease: Secondary | ICD-10-CM | POA: Diagnosis not present

## 2017-07-01 DIAGNOSIS — D631 Anemia in chronic kidney disease: Secondary | ICD-10-CM | POA: Diagnosis not present

## 2017-07-01 DIAGNOSIS — N2581 Secondary hyperparathyroidism of renal origin: Secondary | ICD-10-CM | POA: Diagnosis not present

## 2017-07-02 ENCOUNTER — Ambulatory Visit (HOSPITAL_COMMUNITY)
Admission: RE | Admit: 2017-07-02 | Discharge: 2017-07-02 | Disposition: A | Discharge status: Home / Self Care | Payer: Medicare Other | Source: Ambulatory Visit | Attending: Vascular Surgery | Admitting: Vascular Surgery

## 2017-07-02 DIAGNOSIS — N400 Enlarged prostate without lower urinary tract symptoms: Secondary | ICD-10-CM | POA: Diagnosis not present

## 2017-07-02 DIAGNOSIS — Y832 Surgical operation with anastomosis, bypass or graft as the cause of abnormal reaction of the patient, or of later complication, without mention of misadventure at the time of the procedure: Secondary | ICD-10-CM | POA: Diagnosis not present

## 2017-07-02 DIAGNOSIS — I728 Aneurysm of other specified arteries: Secondary | ICD-10-CM | POA: Diagnosis not present

## 2017-07-02 DIAGNOSIS — K8020 Calculus of gallbladder without cholecystitis without obstruction: Secondary | ICD-10-CM | POA: Insufficient documentation

## 2017-07-02 DIAGNOSIS — T82390A Other mechanical complication of aortic (bifurcation) graft (replacement), initial encounter: Secondary | ICD-10-CM | POA: Insufficient documentation

## 2017-07-02 DIAGNOSIS — I714 Abdominal aortic aneurysm, without rupture: Secondary | ICD-10-CM | POA: Diagnosis not present

## 2017-07-02 DIAGNOSIS — I723 Aneurysm of iliac artery: Secondary | ICD-10-CM

## 2017-07-02 LAB — POCT I-STAT CREATININE: Creatinine, Ser: 6.7 mg/dL — ABNORMAL HIGH (ref 0.61–1.24)

## 2017-07-02 MED ORDER — IOPAMIDOL (ISOVUE-370) INJECTION 76%
INTRAVENOUS | Status: AC
  Filled 2017-07-02: qty 100

## 2017-07-02 MED ORDER — IOPAMIDOL (ISOVUE-370) INJECTION 76%
100.0000 mL | Freq: Once | INTRAVENOUS | Status: AC | PRN
  Administered 2017-07-02: 100 mL via INTRAVENOUS

## 2017-07-03 DIAGNOSIS — N2581 Secondary hyperparathyroidism of renal origin: Secondary | ICD-10-CM | POA: Diagnosis not present

## 2017-07-03 DIAGNOSIS — I120 Hypertensive chronic kidney disease with stage 5 chronic kidney disease or end stage renal disease: Secondary | ICD-10-CM | POA: Diagnosis not present

## 2017-07-03 DIAGNOSIS — N186 End stage renal disease: Secondary | ICD-10-CM | POA: Diagnosis not present

## 2017-07-03 DIAGNOSIS — D631 Anemia in chronic kidney disease: Secondary | ICD-10-CM | POA: Diagnosis not present

## 2017-07-03 DIAGNOSIS — Z992 Dependence on renal dialysis: Secondary | ICD-10-CM | POA: Diagnosis not present

## 2017-07-06 DIAGNOSIS — N186 End stage renal disease: Secondary | ICD-10-CM | POA: Diagnosis not present

## 2017-07-06 DIAGNOSIS — D631 Anemia in chronic kidney disease: Secondary | ICD-10-CM | POA: Diagnosis not present

## 2017-07-06 DIAGNOSIS — N2581 Secondary hyperparathyroidism of renal origin: Secondary | ICD-10-CM | POA: Diagnosis not present

## 2017-07-08 DIAGNOSIS — D631 Anemia in chronic kidney disease: Secondary | ICD-10-CM | POA: Diagnosis not present

## 2017-07-08 DIAGNOSIS — N186 End stage renal disease: Secondary | ICD-10-CM | POA: Diagnosis not present

## 2017-07-08 DIAGNOSIS — N2581 Secondary hyperparathyroidism of renal origin: Secondary | ICD-10-CM | POA: Diagnosis not present

## 2017-07-09 ENCOUNTER — Ambulatory Visit (INDEPENDENT_AMBULATORY_CARE_PROVIDER_SITE_OTHER): Payer: Medicare Other | Admitting: Vascular Surgery

## 2017-07-09 ENCOUNTER — Encounter: Payer: Self-pay | Admitting: Vascular Surgery

## 2017-07-09 ENCOUNTER — Other Ambulatory Visit: Payer: Self-pay

## 2017-07-09 VITALS — BP 106/72 | HR 75 | Temp 98.4°F | Resp 16 | Ht 74.0 in | Wt 185.0 lb

## 2017-07-09 DIAGNOSIS — I723 Aneurysm of iliac artery: Secondary | ICD-10-CM

## 2017-07-09 NOTE — H&P (View-Only) (Signed)
Patient is a 67 year old male who returns for follow-up today.  He underwent aortic and iliac artery stent graft repair July 2016 primarily for iliac artery aneurysms.  He was noted on recent duplex ultrasound to have an endoleak in the right common iliac aneurysm with some growth.  He denies any abdominal or back pain.  The left common iliac aneurysm was 3.7 cm preoperatively.  The right was 3.1.  Past Medical History:  Diagnosis Date  . Anemia in chronic kidney disease   . Arthritis    "hands; basically all my joints" (11/08/2014)  . CHF (congestive heart failure) ([place])   . Chronic back pain    "mostly lower" (11/08/2014)  . End stage renal disease ([place])    pt does not urinate; pt. states that he does dialysis on MWF; [place]" (11/08/2014)  . GERD (gastroesophageal reflux disease)    "sometimes" (11/08/2014)  . Gout    "hands" (11/08/2014)  . Heart failure with reduced ejection fraction ([place])   . Hypertension     Past Surgical History:  Procedure Laterality Date  . AV FISTULA PLACEMENT Right 07/26/2009  . EMBOLIZATION Left 11/08/2014   internal iliac artery; Gore Excluder bifurcated stent graft for repair of common iliac aneurysm  . ENDOVASCULAR STENT INSERTION Bilateral 11/08/2014   Procedure: REPAIR OF BILATERAL ILIAC ARTERY ANEURYSM WITH  Bifurcated stent, with coiling left internal artery;  Surgeon: Elam Dutch, MD;  Location: [place];  Service: Vascular;  Laterality: Bilateral;  . INSERTION OF DIALYSIS CATHETER N/A 02/22/2013   Procedure: INSERTION OF DIALYSIS CATHETER;  Surgeon: Elam Dutch, MD;  Location: [place];  Service: Vascular;  Laterality: N/A;  . LIGATION OF ARTERIOVENOUS  FISTULA Right 77/41/2878   Procedure: PLICATION OF ARTERIOVENOUS  FISTULA;  Surgeon: Elam Dutch, MD;  [facility];  Service: Vascular;  Laterality: Right;  . REVISON OF ARTERIOVENOUS FISTULA Right 67/67/2094   Procedure: PLICATION OF RIGHT ARM  ARTERIOVENOUS FISTULA;  Surgeon: Elam Dutch, MD;  Location: [place];  Service: Vascular;  Laterality: Right;    Review of systems: He denies shortness of breath.  He denies chest pain.  Current Outpatient Medications on File Prior to Visit  Medication Sig Dispense Refill  . aspirin EC 81 MG tablet Take 1 tablet (81 mg total) by mouth daily. 90 tablet 3  . colchicine 0.6 MG tablet Take 1 tablet by mouth daily as needed.  0  . Ferric Citrate (AURYXIA) 210 MG TABS Take 1 capsule by mouth 3 (three) times daily with meals.    . fludrocortisone (FLORINEF) 0.1 MG tablet Take 0.1 mg by mouth 3 (three) times a week. Mon Wed Fri    . midodrine (PROAMATINE) 10 MG tablet Take 10 mg by mouth 2 (two) times daily.     Marland Kitchen morphine (MSIR) 15 MG tablet take 1 tablet by mouth every 6 hours if needed for pain  0  . omeprazole (PRILOSEC) 40 MG capsule Take 40 mg by mouth daily.    Marland Kitchen oxyCODONE-acetaminophen (PERCOCET) 10-325 MG tablet Take 1 tablet by mouth 4 (four) times daily as needed.    . predniSONE (DELTASONE) 5 MG tablet Take 1 tablet by mouth daily as needed.  0  . promethazine (PHENERGAN) 25 MG tablet Take 25 mg by mouth every 6 (six) hours as needed for nausea or vomiting.    . SENSIPAR 30 MG tablet Take 30 mg by mouth daily.    Marland Kitchen atorvastatin (LIPITOR) 40 MG tablet Take 1 tablet (  40 mg total) by mouth daily. 90 tablet 3   No current facility-administered medications on file prior to visit.     No Known Allergies  Physical exam:  Vitals:   07/09/17 1554  BP: 106/72  Pulse: 75  Resp: 16  Temp: 98.4 F (36.9 C)  TempSrc: Oral  SpO2: 98%  Weight: 185 lb (83.9 kg)  Height: 6\' 2"  (1.88 m)    Chest: Clear to auscultation bilaterally  Cardiac: Regular rate and rhythm  Abdomen: Soft nontender no pulsatile mass  Extremities: 2+ femoral pulses bilaterally  Data: Patient had a CT Angio of the abdomen and pelvis earlier this week.  Right common iliac aneurysm is now 3.2 cm in diameter.  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  The left common iliac aneurysm currently measures 3.7 cm and was previously 3.6 cm.  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  Plan: I believe this point we should go ahead and either embolized were used a branch device to fully treat the right common iliac aneurysm and extend into the external iliac artery.  I discussed with the patient today that this may limit options for a kidney transplant but that it would be difficult to consider kidney transplantation with an untreated iliac aneurysm.  Risk benefits possible complications of procedure details of the procedure were discussed with the patient today.  He understands and agrees to proceed.  We will make measurements for a branched slice versus embolization and covering of the internal iliac and plan his operation within the next few weeks.  We will have to work around his dialysis schedule.  [name], MD [facility] [place] Office: [phone] Pager: [phone]

## 2017-07-09 NOTE — Progress Notes (Signed)
Patient is a 67 year old male who returns for follow-up today.  He underwent aortic and iliac artery stent graft repair July 2016 primarily for iliac artery aneurysms.  He was noted on recent duplex ultrasound to have an endoleak in the right common iliac aneurysm with some growth.  He denies any abdominal or back pain.  The left common iliac aneurysm was 3.7 cm preoperatively.  The right was 3.1.  Past Medical History:  Diagnosis Date  . Anemia in chronic kidney disease   . Arthritis    "hands; basically all my joints" (11/08/2014)  . CHF (congestive heart failure) ([place])   . Chronic back pain    "mostly lower" (11/08/2014)  . End stage renal disease ([place])    pt does not urinate; pt. states that he does dialysis on MWF; [place]" (11/08/2014)  . GERD (gastroesophageal reflux disease)    "sometimes" (11/08/2014)  . Gout    "hands" (11/08/2014)  . Heart failure with reduced ejection fraction ([place])   . Hypertension     Past Surgical History:  Procedure Laterality Date  . AV FISTULA PLACEMENT Right 07/26/2009  . EMBOLIZATION Left 11/08/2014   internal iliac artery; Gore Excluder bifurcated stent graft for repair of common iliac aneurysm  . ENDOVASCULAR STENT INSERTION Bilateral 11/08/2014   Procedure: REPAIR OF BILATERAL ILIAC ARTERY ANEURYSM WITH  Bifurcated stent, with coiling left internal artery;  Surgeon: Elam Dutch, MD;  Location: [place];  Service: Vascular;  Laterality: Bilateral;  . INSERTION OF DIALYSIS CATHETER N/A 02/22/2013   Procedure: INSERTION OF DIALYSIS CATHETER;  Surgeon: Elam Dutch, MD;  Location: [place];  Service: Vascular;  Laterality: N/A;  . LIGATION OF ARTERIOVENOUS  FISTULA Right 54/01/8118   Procedure: PLICATION OF ARTERIOVENOUS  FISTULA;  Surgeon: Elam Dutch, MD;  Location[facility];  Service: Vascular;  Laterality: Right;  . REVISON OF ARTERIOVENOUS FISTULA Right 14/78/2956   Procedure: PLICATION OF RIGHT ARM  ARTERIOVENOUS FISTULA;  Surgeon: Elam Dutch, MD;  Location: [place];  Service: Vascular;  Laterality: Right;    Review of systems: He denies shortness of breath.  He denies chest pain.  Current Outpatient Medications on File Prior to Visit  Medication Sig Dispense Refill  . aspirin EC 81 MG tablet Take 1 tablet (81 mg total) by mouth daily. 90 tablet 3  . colchicine 0.6 MG tablet Take 1 tablet by mouth daily as needed.  0  . Ferric Citrate (AURYXIA) 210 MG TABS Take 1 capsule by mouth 3 (three) times daily with meals.    . fludrocortisone (FLORINEF) 0.1 MG tablet Take 0.1 mg by mouth 3 (three) times a week. Mon Wed Fri    . midodrine (PROAMATINE) 10 MG tablet Take 10 mg by mouth 2 (two) times daily.     Marland Kitchen morphine (MSIR) 15 MG tablet take 1 tablet by mouth every 6 hours if needed for pain  0  . omeprazole (PRILOSEC) 40 MG capsule Take 40 mg by mouth daily.    Marland Kitchen oxyCODONE-acetaminophen (PERCOCET) 10-325 MG tablet Take 1 tablet by mouth 4 (four) times daily as needed.    . predniSONE (DELTASONE) 5 MG tablet Take 1 tablet by mouth daily as needed.  0  . promethazine (PHENERGAN) 25 MG tablet Take 25 mg by mouth every 6 (six) hours as needed for nausea or vomiting.    . SENSIPAR 30 MG tablet Take 30 mg by mouth daily.    Marland Kitchen atorvastatin (LIPITOR) 40 MG tablet Take 1 tablet (  40 mg total) by mouth daily. 90 tablet 3   No current facility-administered medications on file prior to visit.     No Known Allergies  Physical exam:  Vitals:   07/09/17 1554  BP: 106/72  Pulse: 75  Resp: 16  Temp: 98.4 F (36.9 C)  TempSrc: Oral  SpO2: 98%  Weight: 185 lb (83.9 kg)  Height: 6\' 2"  (1.88 m)    Chest: Clear to auscultation bilaterally  Cardiac: Regular rate and rhythm  Abdomen: Soft nontender no pulsatile mass  Extremities: 2+ femoral pulses bilaterally  Data: Patient had a CT Angio of the abdomen and pelvis earlier this week.  Right common iliac aneurysm is now 3.2 cm in diameter.  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  The left common iliac aneurysm currently measures 3.7 cm and was previously 3.6 cm.  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  Plan: I believe this point we should go ahead and either embolized were used a branch device to fully treat the right common iliac aneurysm and extend into the external iliac artery.  I discussed with the patient today that this may limit options for a kidney transplant but that it would be difficult to consider kidney transplantation with an untreated iliac aneurysm.  Risk benefits possible complications of procedure details of the procedure were discussed with the patient today.  He understands and agrees to proceed.  We will make measurements for a branched slice versus embolization and covering of the internal iliac and plan his operation within the next few weeks.  We will have to work around his dialysis schedule.  [name], MD [facility] [place] Office: [phone] Pager: [phone]

## 2017-07-10 DIAGNOSIS — D631 Anemia in chronic kidney disease: Secondary | ICD-10-CM | POA: Diagnosis not present

## 2017-07-10 DIAGNOSIS — N2581 Secondary hyperparathyroidism of renal origin: Secondary | ICD-10-CM | POA: Diagnosis not present

## 2017-07-10 DIAGNOSIS — N186 End stage renal disease: Secondary | ICD-10-CM | POA: Diagnosis not present

## 2017-07-13 DIAGNOSIS — N186 End stage renal disease: Secondary | ICD-10-CM | POA: Diagnosis not present

## 2017-07-13 DIAGNOSIS — N2581 Secondary hyperparathyroidism of renal origin: Secondary | ICD-10-CM | POA: Diagnosis not present

## 2017-07-13 DIAGNOSIS — D631 Anemia in chronic kidney disease: Secondary | ICD-10-CM | POA: Diagnosis not present

## 2017-07-14 ENCOUNTER — Encounter: Payer: Self-pay | Admitting: Vascular Surgery

## 2017-07-15 DIAGNOSIS — N2581 Secondary hyperparathyroidism of renal origin: Secondary | ICD-10-CM | POA: Diagnosis not present

## 2017-07-15 DIAGNOSIS — N186 End stage renal disease: Secondary | ICD-10-CM | POA: Diagnosis not present

## 2017-07-15 DIAGNOSIS — D631 Anemia in chronic kidney disease: Secondary | ICD-10-CM | POA: Diagnosis not present

## 2017-07-17 DIAGNOSIS — D631 Anemia in chronic kidney disease: Secondary | ICD-10-CM | POA: Diagnosis not present

## 2017-07-17 DIAGNOSIS — N2581 Secondary hyperparathyroidism of renal origin: Secondary | ICD-10-CM | POA: Diagnosis not present

## 2017-07-17 DIAGNOSIS — N186 End stage renal disease: Secondary | ICD-10-CM | POA: Diagnosis not present

## 2017-07-20 DIAGNOSIS — N186 End stage renal disease: Secondary | ICD-10-CM | POA: Diagnosis not present

## 2017-07-20 DIAGNOSIS — D631 Anemia in chronic kidney disease: Secondary | ICD-10-CM | POA: Diagnosis not present

## 2017-07-20 DIAGNOSIS — N2581 Secondary hyperparathyroidism of renal origin: Secondary | ICD-10-CM | POA: Diagnosis not present

## 2017-07-21 ENCOUNTER — Other Ambulatory Visit: Payer: Self-pay | Admitting: *Deleted

## 2017-07-22 DIAGNOSIS — N2581 Secondary hyperparathyroidism of renal origin: Secondary | ICD-10-CM | POA: Diagnosis not present

## 2017-07-22 DIAGNOSIS — D631 Anemia in chronic kidney disease: Secondary | ICD-10-CM | POA: Diagnosis not present

## 2017-07-22 DIAGNOSIS — N186 End stage renal disease: Secondary | ICD-10-CM | POA: Diagnosis not present

## 2017-07-24 ENCOUNTER — Other Ambulatory Visit: Payer: Self-pay

## 2017-07-24 ENCOUNTER — Encounter (HOSPITAL_COMMUNITY): Payer: Self-pay | Admitting: *Deleted

## 2017-07-24 DIAGNOSIS — D631 Anemia in chronic kidney disease: Secondary | ICD-10-CM | POA: Diagnosis not present

## 2017-07-24 DIAGNOSIS — N186 End stage renal disease: Secondary | ICD-10-CM | POA: Diagnosis not present

## 2017-07-24 DIAGNOSIS — N2581 Secondary hyperparathyroidism of renal origin: Secondary | ICD-10-CM | POA: Diagnosis not present

## 2017-07-24 NOTE — Progress Notes (Signed)
Anesthesia Chart Review:  Pt is a same day work up.   Pt is 67 year old male scheduled for repair R iliac artery aneurysm, possible L brachial access, R embolectomy coiling on 07/27/2017 with [name], MD  - Cardiologist is Kirk Ruths, MD.  Last office visit 06/05/16. Per Zigmund Daniel in Dr. Oneida Alar' office, Dr. Oneida Alar has spoken with Dr. Stanford Breed and has verbal clearance to proceed with surgery.   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 Never smoker. BMI 24  Medications include: ASA 81mg , lipitor, prednisone  Labs will be obtained day of surgery  EKG will be obtained day of surgery  CT angio abdomen/pelvis 07/02/17:  - VASCULAR 1. Patent infrarenal bifurcated stent graft, extending to exclude left common iliac aneurysm, with persistent type 2 endoleak. 2. Distal aortic aneurysm 3.4 cm, stable. 3. Left common iliac aneurysm 3.7 cm, previously 3.6. 4. Incompletely covered right common iliac artery aneurysm 3.2 cm, previously 3.1. 5. 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 - NON-VASCULAR 1. No acute findings. 2. Cholelithiasis 3. Prostatic enlargement.  EVAR duplex 06/25/17:  - Abdominal Aorta: abnormal dilitation of the Distal Abdominal aorta. Abnormal dilation of the Left Common Iliac artery. The largest aortic measurement is 3.5 cm. Evidence of an endoleak. Patent endovascular aneurysm repair with evidence of endoleak. The largest aortic diameter remains essentially unchanged compared to prior exam. Previous diameter measurement was 3.6 cm obtained on 06/19/2016.  Echo 05/15/16:  - Left ventricle: The cavity size was normal. Wall thickness was increased in a pattern of mild LVH. There was focal basal hypertrophy. Systolic function was normal. The estimated ejection fraction was  in the range of 55% to 60%. Wall motion was normal; there were no regional wall motion abnormalities. Doppler parameters are consistent with abnormal left ventricular relaxation (grade 1 diastolic dysfunction). - Aortic valve: There was mild stenosis. Mean gradient (S): 15 mm Hg. Peak gradient (S): 25 mm Hg. Valve area (VTI): 2.54 cm^2. Valve area (Vmax): 2.45 cm^2. Valve area (Vmean): 2.49 cm^2. - Mitral valve: The findings are consistent with mild stenosis.   Valve area by pressure half-time: 1.59 cm^2. Valve area by   continuity equation (using LVOT flow): 3.53 cm^2. - Left atrium: The atrium was moderately dilated. - Right atrium: The atrium was mildly dilated.  Cardiac telemetry 04/24/16:  - Sinus with occasional PACs and PVCs  If labs and EKG acceptable day of surgery, I anticipate pt can proceed with surgery as scheduled.   Willeen Cass, FNP-BC [facility] Short Stay Surgical Center/Anesthesiology Phone: [phone] 07/24/2017 1:10 PM

## 2017-07-24 NOTE — Progress Notes (Signed)
Pt denies any acute cardiopulmonary issues. Pt under the care of  Dr. Stanford Breed, Cardiology.  Pt denies having a cardiac cath. Pt denies having a chest x rau and EKG within the last year. Pt made aware to stop taking vitamins, fish oil and herbal medications. Do not take any NSAIDs ie: Ibuprofen, Advil, Naproxen (Aleve), Motrin, BC and Goody Powder. Pt verbalized understanding of all pre-op instructions. See anesthesia note.

## 2017-07-27 ENCOUNTER — Encounter (HOSPITAL_COMMUNITY): Payer: Self-pay

## 2017-07-27 ENCOUNTER — Inpatient Hospital Stay (HOSPITAL_COMMUNITY): Payer: Medicare Other

## 2017-07-27 ENCOUNTER — Inpatient Hospital Stay (HOSPITAL_COMMUNITY): Payer: Medicare Other | Admitting: Emergency Medicine

## 2017-07-27 ENCOUNTER — Inpatient Hospital Stay (HOSPITAL_COMMUNITY)
Admission: RE | Admit: 2017-07-27 | Discharge: 2017-07-28 | DRG: 252 | Disposition: A | Discharge status: Home Health | Payer: Medicare Other | Source: Ambulatory Visit | Attending: Vascular Surgery | Admitting: Vascular Surgery

## 2017-07-27 ENCOUNTER — Encounter (HOSPITAL_COMMUNITY)
Admission: RE | Disposition: A | Discharge status: Home Health | Payer: Self-pay | Source: Ambulatory Visit | Attending: Vascular Surgery

## 2017-07-27 DIAGNOSIS — Z8249 Family history of ischemic heart disease and other diseases of the circulatory system: Secondary | ICD-10-CM | POA: Diagnosis not present

## 2017-07-27 DIAGNOSIS — I723 Aneurysm of iliac artery: Principal | ICD-10-CM | POA: Diagnosis present

## 2017-07-27 DIAGNOSIS — N2581 Secondary hyperparathyroidism of renal origin: Secondary | ICD-10-CM | POA: Diagnosis present

## 2017-07-27 DIAGNOSIS — Z833 Family history of diabetes mellitus: Secondary | ICD-10-CM

## 2017-07-27 DIAGNOSIS — Z452 Encounter for adjustment and management of vascular access device: Secondary | ICD-10-CM | POA: Diagnosis not present

## 2017-07-27 DIAGNOSIS — M549 Dorsalgia, unspecified: Secondary | ICD-10-CM | POA: Diagnosis present

## 2017-07-27 DIAGNOSIS — Z7982 Long term (current) use of aspirin: Secondary | ICD-10-CM | POA: Diagnosis not present

## 2017-07-27 DIAGNOSIS — M109 Gout, unspecified: Secondary | ICD-10-CM | POA: Diagnosis present

## 2017-07-27 DIAGNOSIS — N186 End stage renal disease: Secondary | ICD-10-CM | POA: Diagnosis present

## 2017-07-27 DIAGNOSIS — I9589 Other hypotension: Secondary | ICD-10-CM | POA: Diagnosis present

## 2017-07-27 DIAGNOSIS — R0989 Other specified symptoms and signs involving the circulatory and respiratory systems: Secondary | ICD-10-CM | POA: Diagnosis not present

## 2017-07-27 DIAGNOSIS — I5022 Chronic systolic (congestive) heart failure: Secondary | ICD-10-CM | POA: Diagnosis present

## 2017-07-27 DIAGNOSIS — D631 Anemia in chronic kidney disease: Secondary | ICD-10-CM | POA: Diagnosis present

## 2017-07-27 DIAGNOSIS — K219 Gastro-esophageal reflux disease without esophagitis: Secondary | ICD-10-CM | POA: Diagnosis present

## 2017-07-27 DIAGNOSIS — M159 Polyosteoarthritis, unspecified: Secondary | ICD-10-CM | POA: Diagnosis present

## 2017-07-27 DIAGNOSIS — I132 Hypertensive heart and chronic kidney disease with heart failure and with stage 5 chronic kidney disease, or end stage renal disease: Secondary | ICD-10-CM | POA: Diagnosis present

## 2017-07-27 DIAGNOSIS — G8929 Other chronic pain: Secondary | ICD-10-CM | POA: Diagnosis present

## 2017-07-27 DIAGNOSIS — L89303 Pressure ulcer of unspecified buttock, stage 3: Secondary | ICD-10-CM | POA: Diagnosis not present

## 2017-07-27 DIAGNOSIS — I120 Hypertensive chronic kidney disease with stage 5 chronic kidney disease or end stage renal disease: Secondary | ICD-10-CM | POA: Diagnosis not present

## 2017-07-27 DIAGNOSIS — M898X9 Other specified disorders of bone, unspecified site: Secondary | ICD-10-CM | POA: Diagnosis present

## 2017-07-27 DIAGNOSIS — Z7952 Long term (current) use of systemic steroids: Secondary | ICD-10-CM

## 2017-07-27 DIAGNOSIS — Z992 Dependence on renal dialysis: Secondary | ICD-10-CM | POA: Diagnosis not present

## 2017-07-27 HISTORY — PX: REPAIR ILIAC ARTERY: SHX6216

## 2017-07-27 HISTORY — PX: ILIAC ARTERY ANEURYSM REPAIR: SUR1158

## 2017-07-27 LAB — BASIC METABOLIC PANEL
ANION GAP: 14 (ref 5–15)
BUN: 56 mg/dL — ABNORMAL HIGH (ref 6–20)
CO2: 23 mmol/L (ref 22–32)
Calcium: 7.8 mg/dL — ABNORMAL LOW (ref 8.9–10.3)
Chloride: 100 mmol/L — ABNORMAL LOW (ref 101–111)
Creatinine, Ser: 10.69 mg/dL — ABNORMAL HIGH (ref 0.61–1.24)
GFR, EST AFRICAN AMERICAN: 5 mL/min — AB (ref >60)
GFR, EST NON AFRICAN AMERICAN: 4 mL/min — AB (ref >60)
GLUCOSE: 94 mg/dL (ref 65–99)
POTASSIUM: 4 mmol/L (ref 3.5–5.1)
Sodium: 137 mmol/L (ref 135–145)

## 2017-07-27 LAB — CBC
HCT: 43 % (ref 39.0–52.0)
HCT: 39.6 % (ref 39.0–52.0)
HEMATOCRIT: 41.3 % (ref 39.0–52.0)
HEMOGLOBIN: 13.8 g/dL (ref 13.0–17.0)
Hemoglobin: 13.3 g/dL (ref 13.0–17.0)
Hemoglobin: 12.7 g/dL — ABNORMAL LOW (ref 13.0–17.0)
MCH: 30.9 pg (ref 26.0–34.0)
MCH: 31 pg (ref 26.0–34.0)
MCH: 30.8 pg (ref 26.0–34.0)
MCHC: 32.1 g/dL (ref 30.0–36.0)
MCHC: 32.2 g/dL (ref 30.0–36.0)
MCHC: 32.1 g/dL (ref 30.0–36.0)
MCV: 96.4 fL (ref 78.0–100.0)
MCV: 96.3 fL (ref 78.0–100.0)
MCV: 95.9 fL (ref 78.0–100.0)
PLATELETS: 96 10*3/uL — AB (ref 150–400)
Platelets: 127 10*3/uL — ABNORMAL LOW (ref 150–400)
Platelets: 109 10*3/uL — ABNORMAL LOW (ref 150–400)
RBC: 4.46 MIL/uL (ref 4.22–5.81)
RBC: 4.29 MIL/uL (ref 4.22–5.81)
RBC: 4.13 MIL/uL — ABNORMAL LOW (ref 4.22–5.81)
RDW: 14.6 % (ref 11.5–15.5)
RDW: 14.6 % (ref 11.5–15.5)
RDW: 14.4 % (ref 11.5–15.5)
WBC: 4.8 10*3/uL (ref 4.0–10.5)
WBC: 3.7 10*3/uL — AB (ref 4.0–10.5)
WBC: 4.8 K/uL (ref 4.0–10.5)

## 2017-07-27 LAB — SURGICAL PCR SCREEN
MRSA, PCR: NEGATIVE
STAPHYLOCOCCUS AUREUS: POSITIVE — AB

## 2017-07-27 LAB — COMPREHENSIVE METABOLIC PANEL
ALK PHOS: 109 U/L (ref 38–126)
ALT: 10 U/L — AB (ref 17–63)
ANION GAP: 16 — AB (ref 5–15)
AST: 15 U/L (ref 15–41)
Albumin: 3.4 g/dL — ABNORMAL LOW (ref 3.5–5.0)
BUN: 55 mg/dL — ABNORMAL HIGH (ref 6–20)
CALCIUM: 8.2 mg/dL — AB (ref 8.9–10.3)
CO2: 23 mmol/L (ref 22–32)
CREATININE: 10.99 mg/dL — AB (ref 0.61–1.24)
Chloride: 97 mmol/L — ABNORMAL LOW (ref 101–111)
GFR, EST AFRICAN AMERICAN: 5 mL/min — AB (ref >60)
GFR, EST NON AFRICAN AMERICAN: 4 mL/min — AB (ref >60)
Glucose, Bld: 83 mg/dL (ref 65–99)
Potassium: 4.2 mmol/L (ref 3.5–5.1)
SODIUM: 136 mmol/L (ref 135–145)
TOTAL PROTEIN: 6.2 g/dL — AB (ref 6.5–8.1)
Total Bilirubin: 1 mg/dL (ref 0.3–1.2)

## 2017-07-27 LAB — RENAL FUNCTION PANEL
Albumin: 2.9 g/dL — ABNORMAL LOW (ref 3.5–5.0)
Anion gap: 14 (ref 5–15)
BUN: 58 mg/dL — ABNORMAL HIGH (ref 6–20)
CO2: 21 mmol/L — ABNORMAL LOW (ref 22–32)
Calcium: 7.5 mg/dL — ABNORMAL LOW (ref 8.9–10.3)
Chloride: 100 mmol/L — ABNORMAL LOW (ref 101–111)
Creatinine, Ser: 10.77 mg/dL — ABNORMAL HIGH (ref 0.61–1.24)
GFR calc Af Amer: 5 mL/min — ABNORMAL LOW (ref >60)
GFR calc non Af Amer: 4 mL/min — ABNORMAL LOW (ref >60)
Glucose, Bld: 82 mg/dL (ref 65–99)
Phosphorus: 5.8 mg/dL — ABNORMAL HIGH (ref 2.5–4.6)
Potassium: 4.4 mmol/L (ref 3.5–5.1)
Sodium: 135 mmol/L (ref 135–145)

## 2017-07-27 LAB — PROTIME-INR
INR: 1.11
INR: 1.25
PROTHROMBIN TIME: 14.2 s (ref 11.4–15.2)
Prothrombin Time: 15.6 seconds — ABNORMAL HIGH (ref 11.4–15.2)

## 2017-07-27 LAB — APTT
APTT: 39 s — AB (ref 24–36)
aPTT: 34 seconds (ref 24–36)

## 2017-07-27 LAB — TYPE AND SCREEN
ABO/RH(D): O POS
Antibody Screen: NEGATIVE

## 2017-07-27 LAB — MAGNESIUM: MAGNESIUM: 2.3 mg/dL (ref 1.7–2.4)

## 2017-07-27 SURGERY — REPAIR, ARTERY, ILIAC
Anesthesia: General | Laterality: Right

## 2017-07-27 MED ORDER — ONDANSETRON HCL 4 MG/2ML IJ SOLN: 4.0000 mg | Freq: Four times a day (QID) | INTRAMUSCULAR | Status: DC | PRN

## 2017-07-27 MED ORDER — ALBUMIN HUMAN 25 % IV SOLN
INTRAVENOUS | Status: AC
  Administered 2017-07-27: 25 g via INTRAVENOUS
  Filled 2017-07-27: qty 100

## 2017-07-27 MED ORDER — SODIUM CHLORIDE 0.9 % IV SOLN: 500.0000 mL | Freq: Once | INTRAVENOUS | Status: DC | PRN

## 2017-07-27 MED ORDER — ATORVASTATIN CALCIUM 40 MG PO TABS
40.0000 mg | ORAL_TABLET | Freq: Every day | ORAL | Status: DC
  Administered 2017-07-27: 40 mg via ORAL
  Filled 2017-07-27: qty 1

## 2017-07-27 MED ORDER — PROPOFOL 10 MG/ML IV BOLUS
INTRAVENOUS | Status: AC
Start: 2017-07-27 — End: 2017-07-27
  Filled 2017-07-27: qty 20

## 2017-07-27 MED ORDER — CINACALCET HCL 30 MG PO TABS
60.0000 mg | ORAL_TABLET | Freq: Every day | ORAL | Status: DC
Start: 2017-07-27 — End: 2017-07-28
  Administered 2017-07-27: 60 mg via ORAL
  Filled 2017-07-27 (×3): qty 2

## 2017-07-27 MED ORDER — HYDRALAZINE HCL 20 MG/ML IJ SOLN: 5.0000 mg | INTRAMUSCULAR | Status: DC | PRN

## 2017-07-27 MED ORDER — ACETAMINOPHEN 325 MG PO TABS: 325.0000 mg | ORAL_TABLET | ORAL | Status: DC | PRN

## 2017-07-27 MED ORDER — MUPIROCIN 2 % EX OINT
TOPICAL_OINTMENT | CUTANEOUS | Status: AC
  Filled 2017-07-27: qty 22

## 2017-07-27 MED ORDER — PENTAFLUOROPROP-TETRAFLUOROETH EX AERO: 1.0000 "application " | INHALATION_SPRAY | CUTANEOUS | Status: DC | PRN

## 2017-07-27 MED ORDER — DOCUSATE SODIUM 100 MG PO CAPS
100.0000 mg | ORAL_CAPSULE | Freq: Every day | ORAL | Status: DC
  Administered 2017-07-28: 100 mg via ORAL
  Filled 2017-07-27: qty 1

## 2017-07-27 MED ORDER — SODIUM CHLORIDE 0.9 % IV SOLN: 100.0000 mL | INTRAVENOUS | Status: DC | PRN

## 2017-07-27 MED ORDER — PHENOL 1.4 % MT LIQD: 1.0000 | OROMUCOSAL | Status: DC | PRN

## 2017-07-27 MED ORDER — LIDOCAINE HCL (CARDIAC) 20 MG/ML IV SOLN
INTRAVENOUS | Status: DC | PRN
  Administered 2017-07-27: 40 mg via INTRAVENOUS

## 2017-07-27 MED ORDER — 0.9 % SODIUM CHLORIDE (POUR BTL) OPTIME
TOPICAL | Status: DC | PRN
  Administered 2017-07-27: 1000 mL

## 2017-07-27 MED ORDER — FENTANYL CITRATE (PF) 100 MCG/2ML IJ SOLN
INTRAMUSCULAR | Status: DC | PRN
  Administered 2017-07-27: 100 ug via INTRAVENOUS
  Administered 2017-07-27: 25 ug via INTRAVENOUS

## 2017-07-27 MED ORDER — NEOSTIGMINE METHYLSULFATE 10 MG/10ML IV SOLN
INTRAVENOUS | Status: DC | PRN
  Administered 2017-07-27: 4 mg via INTRAVENOUS

## 2017-07-27 MED ORDER — FENTANYL CITRATE (PF) 100 MCG/2ML IJ SOLN
INTRAMUSCULAR | Status: AC
  Administered 2017-07-27: 50 ug via INTRAVENOUS
  Filled 2017-07-27: qty 2

## 2017-07-27 MED ORDER — FENTANYL CITRATE (PF) 250 MCG/5ML IJ SOLN
INTRAMUSCULAR | Status: AC
  Filled 2017-07-27: qty 5

## 2017-07-27 MED ORDER — ACETAMINOPHEN 325 MG RE SUPP: 325.0000 mg | RECTAL | Status: DC | PRN

## 2017-07-27 MED ORDER — LABETALOL HCL 5 MG/ML IV SOLN: 10.0000 mg | INTRAVENOUS | Status: DC | PRN

## 2017-07-27 MED ORDER — ROCURONIUM BROMIDE 10 MG/ML (PF) SYRINGE
PREFILLED_SYRINGE | INTRAVENOUS | Status: AC
  Filled 2017-07-27: qty 5

## 2017-07-27 MED ORDER — MIDODRINE HCL 5 MG PO TABS
10.0000 mg | ORAL_TABLET | Freq: Once | ORAL | Status: AC
  Administered 2017-07-27: 10 mg via ORAL
  Filled 2017-07-27: qty 2

## 2017-07-27 MED ORDER — PHENYLEPHRINE HCL 10 MG/ML IJ SOLN
INTRAVENOUS | Status: DC | PRN
  Administered 2017-07-27: 50 ug/min via INTRAVENOUS

## 2017-07-27 MED ORDER — POTASSIUM CHLORIDE CRYS ER 20 MEQ PO TBCR: 20.0000 meq | EXTENDED_RELEASE_TABLET | Freq: Every day | ORAL | Status: DC | PRN

## 2017-07-27 MED ORDER — PREDNISONE 5 MG PO TABS: 5.0000 mg | ORAL_TABLET | Freq: Every day | ORAL | Status: DC

## 2017-07-27 MED ORDER — FENTANYL CITRATE (PF) 100 MCG/2ML IJ SOLN
INTRAMUSCULAR | Status: AC
  Filled 2017-07-27: qty 2

## 2017-07-27 MED ORDER — SUCCINYLCHOLINE CHLORIDE 200 MG/10ML IV SOSY
PREFILLED_SYRINGE | INTRAVENOUS | Status: AC
  Filled 2017-07-27: qty 10

## 2017-07-27 MED ORDER — GLYCOPYRROLATE 0.2 MG/ML IJ SOLN
INTRAMUSCULAR | Status: DC | PRN
  Administered 2017-07-27: 0.4 mg via INTRAVENOUS

## 2017-07-27 MED ORDER — ONDANSETRON HCL 4 MG/2ML IJ SOLN
INTRAMUSCULAR | Status: AC
  Filled 2017-07-27: qty 2

## 2017-07-27 MED ORDER — CHLORHEXIDINE GLUCONATE CLOTH 2 % EX PADS: 6.0000 | MEDICATED_PAD | Freq: Once | CUTANEOUS | Status: DC

## 2017-07-27 MED ORDER — PROMETHAZINE HCL 25 MG PO TABS: 25.0000 mg | ORAL_TABLET | Freq: Four times a day (QID) | ORAL | Status: DC | PRN

## 2017-07-27 MED ORDER — PHENYLEPHRINE 40 MCG/ML (10ML) SYRINGE FOR IV PUSH (FOR BLOOD PRESSURE SUPPORT)
PREFILLED_SYRINGE | INTRAVENOUS | Status: AC
  Filled 2017-07-27: qty 10

## 2017-07-27 MED ORDER — ALUM & MAG HYDROXIDE-SIMETH 200-200-20 MG/5ML PO SUSP: 15.0000 mL | ORAL | Status: DC | PRN

## 2017-07-27 MED ORDER — EPHEDRINE 5 MG/ML INJ
INTRAVENOUS | Status: AC
  Filled 2017-07-27: qty 10

## 2017-07-27 MED ORDER — LIDOCAINE HCL (PF) 1 % IJ SOLN: 5.0000 mL | INTRAMUSCULAR | Status: DC | PRN

## 2017-07-27 MED ORDER — LIDOCAINE-PRILOCAINE 2.5-2.5 % EX CREA
1.0000 "application " | TOPICAL_CREAM | CUTANEOUS | Status: DC | PRN
  Filled 2017-07-27: qty 5

## 2017-07-27 MED ORDER — HEPARIN SODIUM (PORCINE) 5000 UNIT/ML IJ SOLN
INTRAMUSCULAR | Status: DC | PRN
  Administered 2017-07-27: 11:00:00

## 2017-07-27 MED ORDER — SUCROFERRIC OXYHYDROXIDE 500 MG PO CHEW
500.0000 mg | CHEWABLE_TABLET | Freq: Three times a day (TID) | ORAL | Status: DC
  Administered 2017-07-28: 500 mg via ORAL
  Filled 2017-07-27 (×3): qty 1

## 2017-07-27 MED ORDER — SENNOSIDES-DOCUSATE SODIUM 8.6-50 MG PO TABS: 1.0000 | ORAL_TABLET | Freq: Every evening | ORAL | Status: DC | PRN

## 2017-07-27 MED ORDER — ALBUMIN HUMAN 25 % IV SOLN
25.0000 g | Freq: Once | INTRAVENOUS | Status: AC
  Administered 2017-07-27: 25 g via INTRAVENOUS

## 2017-07-27 MED ORDER — SUGAMMADEX SODIUM 200 MG/2ML IV SOLN
INTRAVENOUS | Status: AC
  Filled 2017-07-27: qty 2

## 2017-07-27 MED ORDER — ONDANSETRON HCL 4 MG/2ML IJ SOLN
INTRAMUSCULAR | Status: DC | PRN
  Administered 2017-07-27: 4 mg via INTRAVENOUS

## 2017-07-27 MED ORDER — CEFAZOLIN SODIUM-DEXTROSE 1-4 GM/50ML-% IV SOLN
1.0000 g | INTRAVENOUS | Status: AC
  Administered 2017-07-28: 1 g via INTRAVENOUS
  Filled 2017-07-27: qty 50

## 2017-07-27 MED ORDER — COLCHICINE 0.6 MG PO TABS: 0.6000 mg | ORAL_TABLET | Freq: Every day | ORAL | Status: DC | PRN

## 2017-07-27 MED ORDER — MAGNESIUM SULFATE 2 GM/50ML IV SOLN: 2.0000 g | Freq: Every day | INTRAVENOUS | Status: DC | PRN

## 2017-07-27 MED ORDER — GUAIFENESIN-DM 100-10 MG/5ML PO SYRP: 15.0000 mL | ORAL_SOLUTION | ORAL | Status: DC | PRN

## 2017-07-27 MED ORDER — PROTAMINE SULFATE 10 MG/ML IV SOLN
INTRAVENOUS | Status: DC | PRN
  Administered 2017-07-27: 80 mg via INTRAVENOUS

## 2017-07-27 MED ORDER — BISACODYL 5 MG PO TBEC: 5.0000 mg | DELAYED_RELEASE_TABLET | Freq: Every day | ORAL | Status: DC | PRN

## 2017-07-27 MED ORDER — ENOXAPARIN SODIUM 30 MG/0.3ML ~~LOC~~ SOLN: 30.0000 mg | SUBCUTANEOUS | Status: DC

## 2017-07-27 MED ORDER — PHENYLEPHRINE HCL 10 MG/ML IJ SOLN
INTRAMUSCULAR | Status: DC | PRN
  Administered 2017-07-27 (×3): 80 ug via INTRAVENOUS

## 2017-07-27 MED ORDER — FENTANYL CITRATE (PF) 100 MCG/2ML IJ SOLN
25.0000 ug | INTRAMUSCULAR | Status: DC | PRN
  Administered 2017-07-27 (×2): 50 ug via INTRAVENOUS

## 2017-07-27 MED ORDER — PROPOFOL 10 MG/ML IV BOLUS
INTRAVENOUS | Status: DC | PRN
  Administered 2017-07-27: 90 mg via INTRAVENOUS

## 2017-07-27 MED ORDER — SODIUM CHLORIDE 0.9 % IV SOLN
INTRAVENOUS | Status: DC
  Administered 2017-07-27: 17:00:00 via INTRAVENOUS

## 2017-07-27 MED ORDER — OXYCODONE HCL 5 MG PO TABS
5.0000 mg | ORAL_TABLET | Freq: Four times a day (QID) | ORAL | Status: DC
  Administered 2017-07-27 – 2017-07-28 (×3): 5 mg via ORAL
  Filled 2017-07-27 (×3): qty 1

## 2017-07-27 MED ORDER — OXYCODONE-ACETAMINOPHEN 10-325 MG PO TABS: 1.0000 | ORAL_TABLET | Freq: Four times a day (QID) | ORAL | Status: DC

## 2017-07-27 MED ORDER — SODIUM CHLORIDE 0.9 % IV SOLN
INTRAVENOUS | Status: DC
  Administered 2017-07-27 (×2): via INTRAVENOUS

## 2017-07-27 MED ORDER — IODIXANOL 320 MG/ML IV SOLN
INTRAVENOUS | Status: DC | PRN
  Administered 2017-07-27: 150 mL via INTRAVENOUS

## 2017-07-27 MED ORDER — CISATRACURIUM BESYLATE 20 MG/10ML IV SOLN
INTRAVENOUS | Status: AC
  Filled 2017-07-27: qty 10

## 2017-07-27 MED ORDER — OXYCODONE-ACETAMINOPHEN 5-325 MG PO TABS
1.0000 | ORAL_TABLET | Freq: Four times a day (QID) | ORAL | Status: DC
  Administered 2017-07-27 – 2017-07-28 (×3): 1 via ORAL
  Filled 2017-07-27 (×3): qty 1

## 2017-07-27 MED ORDER — CEFAZOLIN SODIUM-DEXTROSE 2-4 GM/100ML-% IV SOLN
2.0000 g | INTRAVENOUS | Status: AC
  Administered 2017-07-27: 2 g via INTRAVENOUS
  Filled 2017-07-27: qty 100

## 2017-07-27 MED ORDER — HEPARIN SODIUM (PORCINE) 1000 UNIT/ML IJ SOLN
INTRAMUSCULAR | Status: DC | PRN
  Administered 2017-07-27: 8000 [IU] via INTRAVENOUS

## 2017-07-27 MED ORDER — MIDAZOLAM HCL 2 MG/2ML IJ SOLN
INTRAMUSCULAR | Status: AC
  Filled 2017-07-27: qty 2

## 2017-07-27 MED ORDER — MORPHINE SULFATE 15 MG PO TABS: 15.0000 mg | ORAL_TABLET | Freq: Four times a day (QID) | ORAL | Status: DC | PRN

## 2017-07-27 MED ORDER — ASPIRIN EC 81 MG PO TBEC
81.0000 mg | DELAYED_RELEASE_TABLET | Freq: Every day | ORAL | Status: DC
  Administered 2017-07-27 – 2017-07-28 (×2): 81 mg via ORAL
  Filled 2017-07-27 (×2): qty 1

## 2017-07-27 MED ORDER — CISATRACURIUM BESYLATE (PF) 10 MG/5ML IV SOLN
INTRAVENOUS | Status: DC | PRN
  Administered 2017-07-27: 14 mg via INTRAVENOUS

## 2017-07-27 MED ORDER — MUPIROCIN 2 % EX OINT
1.0000 "application " | TOPICAL_OINTMENT | Freq: Once | CUTANEOUS | Status: AC
  Administered 2017-07-27: 1 via TOPICAL

## 2017-07-27 MED ORDER — DEXAMETHASONE SODIUM PHOSPHATE 10 MG/ML IJ SOLN
INTRAMUSCULAR | Status: AC
  Filled 2017-07-27: qty 1

## 2017-07-27 MED ORDER — MORPHINE SULFATE (PF) 2 MG/ML IV SOLN: 1.0000 mg | INTRAVENOUS | Status: DC | PRN

## 2017-07-27 MED ORDER — PANTOPRAZOLE SODIUM 40 MG PO TBEC
40.0000 mg | DELAYED_RELEASE_TABLET | Freq: Every day | ORAL | Status: DC
  Administered 2017-07-27 – 2017-07-28 (×2): 40 mg via ORAL
  Filled 2017-07-27 (×2): qty 1

## 2017-07-27 MED ORDER — METOPROLOL TARTRATE 5 MG/5ML IV SOLN: 2.0000 mg | INTRAVENOUS | Status: DC | PRN

## 2017-07-27 SURGICAL SUPPLY — 79 items
BALLN ATLAS 14X40X75 (BALLOONS) ×2
BALLN CODA OCL 2-9.0-35-120-3 (BALLOONS) ×2
BALLOON ATLAS 14X40X75 (BALLOONS) ×1 IMPLANT
BALLOON COD OCL 2-9.0-35-120-3 (BALLOONS) ×1 IMPLANT
BANDAGE ESMARK 6X9 LF (GAUZE/BANDAGES/DRESSINGS) IMPLANT
BNDG ESMARK 6X9 LF (GAUZE/BANDAGES/DRESSINGS)
CANISTER SUCT 3000ML PPV (MISCELLANEOUS) ×2 IMPLANT
CATH ANGIO 5F BER2 100CM (CATHETERS) ×2 IMPLANT
CATH EMB 3FR 80CM (CATHETERS) IMPLANT
CATH EMB 4FR 80CM (CATHETERS) IMPLANT
CATH EMB 5FR 80CM (CATHETERS) IMPLANT
CATH OMNI FLUSH .035X70CM (CATHETERS) ×2 IMPLANT
CLIP VESOCCLUDE MED 24/CT (CLIP) ×2 IMPLANT
CLIP VESOCCLUDE SM WIDE 24/CT (CLIP) ×2 IMPLANT
CUFF TOURNIQUET SINGLE 24IN (TOURNIQUET CUFF) IMPLANT
CUFF TOURNIQUET SINGLE 34IN LL (TOURNIQUET CUFF) IMPLANT
CUFF TOURNIQUET SINGLE 44IN (TOURNIQUET CUFF) IMPLANT
DECANTER SPIKE VIAL GLASS SM (MISCELLANEOUS) IMPLANT
DERMABOND ADVANCED (GAUZE/BANDAGES/DRESSINGS) ×1
DERMABOND ADVANCED .7 DNX12 (GAUZE/BANDAGES/DRESSINGS) ×1 IMPLANT
DEVICE CLOSURE PERCLS PRGLD 6F (VASCULAR PRODUCTS) ×2 IMPLANT
DEVICE INFLATION ENCORE 26 (MISCELLANEOUS) ×2 IMPLANT
DRAIN SNY 10X20 3/4 PERF (WOUND CARE) IMPLANT
DRAPE X-RAY CASS 24X20 (DRAPES) IMPLANT
DRSG TEGADERM 2-3/8X2-3/4 SM (GAUZE/BANDAGES/DRESSINGS) ×2 IMPLANT
DRYSEAL FLEXSHEATH 16FR 33CM (SHEATH) ×1
ELECT REM PT RETURN 9FT ADLT (ELECTROSURGICAL) ×2
ELECTRODE REM PT RTRN 9FT ADLT (ELECTROSURGICAL) ×1 IMPLANT
ENDOPRO ILIAC 23X14X10 FA (Endovascular Graft) ×2 IMPLANT
ENDOPROSTHESIS ILI 23X14X10 FA (Endovascular Graft) ×1 IMPLANT
EVACUATOR SILICONE 100CC (DRAIN) IMPLANT
EXTENDER ENDOPROSTHESIS 14X7 (Endovascular Graft) ×2 IMPLANT
GAUZE SPONGE 2X2 8PLY STRL LF (GAUZE/BANDAGES/DRESSINGS) ×1 IMPLANT
GLOVE BIO SURGEON STRL SZ7.5 (GLOVE) ×2 IMPLANT
GOWN STRL REUS W/ TWL LRG LVL3 (GOWN DISPOSABLE) ×3 IMPLANT
GOWN STRL REUS W/ TWL XL LVL3 (GOWN DISPOSABLE) ×1 IMPLANT
GOWN STRL REUS W/TWL LRG LVL3 (GOWN DISPOSABLE) ×3
GOWN STRL REUS W/TWL XL LVL3 (GOWN DISPOSABLE) ×1
GRAFT BALLN CATH 65CM (STENTS) ×1 IMPLANT
GUIDE HELI-FX 22MM (VASCULAR PRODUCTS) ×2 IMPLANT
KIT BASIN OR (CUSTOM PROCEDURE TRAY) ×2 IMPLANT
KIT ROOM TURNOVER OR (KITS) ×2 IMPLANT
LEG CONTRALATERAL 27X10 (Vascular Products) ×2 IMPLANT
NEEDLE PERC 18GX7CM (NEEDLE) ×2 IMPLANT
NS IRRIG 1000ML POUR BTL (IV SOLUTION) ×4 IMPLANT
PACK PERIPHERAL VASCULAR (CUSTOM PROCEDURE TRAY) ×2 IMPLANT
PAD ARMBOARD 7.5X6 YLW CONV (MISCELLANEOUS) ×4 IMPLANT
PENCIL BUTTON HOLSTER BLD 10FT (ELECTRODE) ×2 IMPLANT
PERCLOSE PROGLIDE 6F (VASCULAR PRODUCTS) ×4
SET COLLECT BLD 21X3/4 12 (NEEDLE) IMPLANT
SHEATH BRITE TIP 8FR 23CM (SHEATH) ×2 IMPLANT
SHEATH DRYSEAL FLEX 16FR 33CM (SHEATH) ×1 IMPLANT
SHEATH PINNACLE 8F 10CM (SHEATH) ×2 IMPLANT
SPONGE GAUZE 2X2 STER 10/PKG (GAUZE/BANDAGES/DRESSINGS) ×1
SPONGE SURGIFOAM ABS GEL 100 (HEMOSTASIS) IMPLANT
STAPLER VISISTAT 35W (STAPLE) IMPLANT
STENT GRAFT BALLN CATH 65CM (STENTS) ×1
STENT VIABAHN 11X59X135 (Permanent Stent) ×1 IMPLANT
STENT VIABAHN 11X59X135CM (Permanent Stent) ×1 IMPLANT
STENT VIABAHN 11X79X135 (Permanent Stent) ×1 IMPLANT
STENT VIABAHN 11X79X135MM (Permanent Stent) ×1 IMPLANT
STOPCOCK 4 WAY LG BORE MALE ST (IV SETS) IMPLANT
STOPCOCK MORSE 400PSI 3WAY (MISCELLANEOUS) ×2 IMPLANT
SUT PROLENE 5 0 C 1 24 (SUTURE) ×2 IMPLANT
SUT PROLENE 6 0 CC (SUTURE) ×2 IMPLANT
SUT VIC AB 2-0 CTX 36 (SUTURE) ×2 IMPLANT
SUT VIC AB 3-0 SH 27 (SUTURE) ×1
SUT VIC AB 3-0 SH 27X BRD (SUTURE) ×1 IMPLANT
SUT VIC AB 4-0 PS2 18 (SUTURE) ×2 IMPLANT
SYR 30ML LL (SYRINGE) ×2 IMPLANT
SYR 3ML LL SCALE MARK (SYRINGE) ×2 IMPLANT
TOWEL GREEN STERILE (TOWEL DISPOSABLE) ×2 IMPLANT
TRAY FOLEY MTR SLVR 16FR STAT (CATHETERS) ×2 IMPLANT
TUBING EXTENTION W/L.L. (IV SETS) IMPLANT
TUBING HIGH PRESSURE 120CM (CONNECTOR) ×2 IMPLANT
UNDERPAD 30X30 (UNDERPADS AND DIAPERS) ×2 IMPLANT
WATER STERILE IRR 1000ML POUR (IV SOLUTION) ×2 IMPLANT
WIRE AMPLATZ SS-J .035X180CM (WIRE) ×2 IMPLANT
WIRE BENTSON .035X145CM (WIRE) ×2 IMPLANT

## 2017-07-27 NOTE — Anesthesia Procedure Notes (Signed)
Procedure Name: Intubation Date/Time: 07/27/2017 11:29 AM Performed by: Shirlyn Goltz, CRNA Pre-anesthesia Checklist: Patient identified, Emergency Drugs available, Suction available and Patient being monitored Patient Re-evaluated:Patient Re-evaluated prior to induction Oxygen Delivery Method: Circle system utilized Preoxygenation: Pre-oxygenation with 100% oxygen Induction Type: IV induction Ventilation: Mask ventilation without difficulty and Oral airway inserted - appropriate to patient size Laryngoscope Size: Mac and 4 Grade View: Grade I Tube type: Oral Tube size: 7.5 mm Number of attempts: 1 Airway Equipment and Method: Stylet Placement Confirmation: ETT inserted through vocal cords under direct vision,  positive ETCO2 and breath sounds checked- equal and bilateral Secured at: 22 cm Tube secured with: Tape Dental Injury: Teeth and Oropharynx as per pre-operative assessment

## 2017-07-27 NOTE — Progress Notes (Signed)
Called for report on pt @ 1824, pt arrived approximately 1843,  HD tx initiated via 15Gx2 w/o problem, pull/push/flush well, VSS but w/ soft bp, pt states he usually runs in the 80's - low 90's at the dialysis clinic and is asymptomatic and requests that I don't turn UF off unless he hits below 80 or he becomes symptomatic as he is aware that he is getting 125 ml/hr of IVF, ordered Midodrine that the primary nurse gave @ 1833 before he arrived to HD unit and I also have order for Albumin 25% 25g Q1 hr on HD tx if needed for bp support, will cont to monitor while on HD tx

## 2017-07-27 NOTE — Anesthesia Postprocedure Evaluation (Signed)
Anesthesia Post Note  Patient: Gregory [name]  Procedure(s) Performed: REPAIR ILIAC ARTERY ANEURYSM (Right )     Patient location during evaluation: PACU Anesthesia Type: General Level of consciousness: awake and alert Pain management: pain level controlled Vital Signs Assessment: post-procedure vital signs reviewed and stable Respiratory status: spontaneous breathing, nonlabored ventilation, respiratory function stable and patient connected to nasal cannula oxygen Cardiovascular status: blood pressure returned to baseline and stable Postop Assessment: no apparent nausea or vomiting Anesthetic complications: no    Last Vitals:  Vitals:   07/27/17 1546 07/27/17 1612  BP:  92/72  Pulse:    Resp:    Temp: 36.6 C 36.4 C  SpO2:      Last Pain:  Vitals:   07/27/17 1807  TempSrc:   PainSc: 5                  Ashleigh Arya COKER

## 2017-07-27 NOTE — Transfer of Care (Signed)
Immediate Anesthesia Transfer of Care Note  Patient: Gregory [name]  Procedure(s) Performed: REPAIR ILIAC ARTERY ANEURYSM (Right )  Patient Location: PACU  Anesthesia Type:General  Level of Consciousness: awake, alert , oriented and patient cooperative  Airway & Oxygen Therapy: Patient Spontanous Breathing  Post-op Assessment: Report given to RN and Post -op Vital signs reviewed and stable  Post vital signs: Reviewed and stable  Last Vitals:  Vitals Value Taken Time  BP 106/78 07/27/2017  1:46 PM  Temp    Pulse 93 07/27/2017  1:46 PM  Resp 12 07/27/2017  1:46 PM  SpO2 100 % 07/27/2017  1:46 PM  Vitals shown include unvalidated device data.  Last Pain:  Vitals:   07/27/17 0918  TempSrc:   PainSc: 0-No pain      Patients Stated Pain Goal: 0 (18/86/77 3736)  Complications: No apparent anesthesia complications

## 2017-07-27 NOTE — Progress Notes (Signed)
    Patient alert and comfortable. Palpable DP 2+ B. Right groin soft.  No new abdominal or lumbar discomfort.  Chest x ray post op in PACU IMPRESSION: Right central line tip in the SVC.  No pneumothorax.  S/P Insertion of branch iliac device for treatment of right common iliac aneurysm  Nephrology consult normal HD T-TH-Sat.  [name] PA-C

## 2017-07-27 NOTE — Progress Notes (Signed)
Patient arrived from PACU to 4E room 3.  Telemetry monitor applied and CCMD notified.  Patient oriented to unit and room to include call light and phone.  Will continue to monitor.

## 2017-07-27 NOTE — Progress Notes (Signed)
Shift report received from day shift RN. Pt currently in dialysis.Left the floor about 6pm.

## 2017-07-27 NOTE — Consult Note (Signed)
 [facility] Renal Consultation Note    Indication for Consultation:  Management of ESRD/hemodialysis; anemia, hypertension/volume and secondary hyperparathyroidism PCP:  HPI: Gregory [name] is a 67 y.o. male with ESRD on hemodialysis MWF at [facility]. PMH significant for Hypertension, gout, GERD, HF (grade 1 diastolic dysfunction EF 51-76% 03/15/2018), chronic back pain, AOCD, SHPT, H/O aneurysms of the spear mesenteric artery, celiac artery and splenic artery. Last HD 07/24/17 left at [facility]. Patient is an excellent dialysis patient, never misses or shortens treatments, adherent to medications and HD prescription.   He presented 07/27/2017 for insertion of branch iliac device for treatment of right common iliac aneurysm per Dr. Oneida Alar. He is being seen on PACU. He is awake, alert, C/O pain F femoral area. He has no other complaints, says he has had no other issues prior to admission. Will have HD later today on schedule.   Past Medical History:  Diagnosis Date  . Anemia in chronic kidney disease   . Arthritis    "hands; basically all my joints" (11/08/2014)  . CHF (congestive heart failure) ([place])   . Chronic back pain    "mostly lower" (11/08/2014)  . End stage renal disease ([place])    pt does not urinate; pt. states that he does dialysis on MWF; [place]" (11/08/2014)  . GERD (gastroesophageal reflux disease)    "sometimes" (11/08/2014)  . Gout    "hands" (11/08/2014)  . Heart failure with reduced ejection fraction ([place])   . Hypertension    Past Surgical History:  Procedure Laterality Date  . AV FISTULA PLACEMENT Right 07/26/2009  . EMBOLIZATION Left 11/08/2014   internal iliac artery; [place] Excluder bifurcated stent graft for repair of common iliac aneurysm  . ENDOVASCULAR STENT INSERTION Bilateral 11/08/2014   Procedure: REPAIR OF BILATERAL ILIAC ARTERY ANEURYSM WITH  Bifurcated stent, with coiling left internal artery;  Surgeon: Elam Dutch, MD;   Location: [place];  Service: Vascular;  Laterality: Bilateral;  . INSERTION OF DIALYSIS CATHETER N/A 02/22/2013   Procedure: INSERTION OF DIALYSIS CATHETER;  Surgeon: Elam Dutch, MD;  Location: [place];  Service: Vascular;  Laterality: N/A;  . LIGATION OF ARTERIOVENOUS  FISTULA Right 16/11/3708   Procedure: PLICATION OF ARTERIOVENOUS  FISTULA;  Surgeon: Elam Dutch, MD;  Location: [facility] OR;  Service: Vascular;  Laterality: Right;  . REVISON OF ARTERIOVENOUS FISTULA Right 62/69/4854   Procedure: PLICATION OF RIGHT ARM  ARTERIOVENOUS FISTULA;  Surgeon: Elam Dutch, MD;  Location: [facility] OR;  Service: Vascular;  Laterality: Right;   Family History  Problem Relation Age of Onset  . Diabetes Father   . Hypertension Father   . Diabetes Sister    Social History:  reports that he has never smoked. He has never used smokeless tobacco. He reports that he does not drink alcohol or use drugs. No Known Allergies Prior to Admission medications   Medication Sig Start Date End Date Taking? Authorizing Provider  aspirin EC 81 MG tablet Take 1 tablet (81 mg total) by mouth daily. 06/05/16  Yes Lelon Perla, MD  B Complex-C-Folic Acid (DIALYVITE 627 PO) Take 1 tablet by mouth every Monday, Wednesday, and Friday with hemodialysis. After dialysis.   Yes [provider]  calcium carbonate (OS-CAL) 1250 (500 Ca) MG chewable tablet Chew 2 tablets by mouth at bedtime.   Yes [provider]  cinacalcet (SENSIPAR) 60 MG tablet Take 60 mg by mouth daily after supper.   Yes [provider]  colchicine  0.6 MG tablet Take 0.6 mg by mouth daily as needed (for gout flare ups.).  12/11/14  Yes [provider]  oxyCODONE-acetaminophen (PERCOCET) 10-325 MG tablet Take 1 tablet by mouth 4 (four) times daily.  05/09/16  Yes [provider]  sucroferric oxyhydroxide (VELPHORO) 500 MG chewable tablet Chew 500 mg by mouth 3 (three) times daily with meals.   Yes [provider]  atorvastatin (LIPITOR) 40 MG tablet Take 1 tablet (40 mg total) by mouth daily. 06/05/16 09/03/16  Lelon Perla, MD  morphine (MSIR) 15 MG tablet Take 15 mg by mouth every 6 (six) hours as needed for severe pain (for pain.).    [provider]  predniSONE (DELTASONE) 5 MG tablet Take 5 mg by mouth daily.  12/11/14   [provider]  promethazine (PHENERGAN) 25 MG tablet Take 25 mg by mouth every 6 (six) hours as needed for nausea or vomiting.    [provider]   Current Facility-Administered Medications  Medication Dose Route Frequency Provider Last Rate Last Dose  . 0.9 %  sodium chloride infusion   Intravenous Continuous Fields, Jessy Oto, MD      . Chlorhexidine Gluconate Cloth 2 % PADS 6 each  6 each Topical Once Elam Dutch, MD       And  . Chlorhexidine Gluconate Cloth 2 % PADS 6 each  6 each Topical Once Elam Dutch, MD      . morphine (MSIR) tablet 15 mg  15 mg Oral Q6H PRN Laurence Slate M, PA-C      . mupirocin ointment (BACTROBAN) 2 %           . oxyCODONE-acetaminophen (PERCOCET) 10-325 MG per tablet 1 tablet  1 tablet Oral QID Ulyses Amor, PA-C       Labs: Basic Metabolic Panel: Recent Labs  Lab 07/27/17 0935  NA 136  K 4.2  CL 97*  CO2 23  GLUCOSE 83  BUN 55*  CREATININE 10.99*  CALCIUM 8.2*   Liver Function Tests: Recent Labs  Lab 07/27/17 0935  AST 15  ALT 10*  ALKPHOS 109  BILITOT 1.0  PROT 6.2*  ALBUMIN 3.4*   No results for input(s): LIPASE, AMYLASE in the last 168 hours. No results for input(s): AMMONIA in the last 168 hours. CBC: Recent Labs  Lab 07/27/17 0935  WBC 4.8  HGB 13.8  HCT 43.0  MCV 96.4  PLT 127*   Cardiac Enzymes: No results for input(s): CKTOTAL, CKMB, CKMBINDEX, TROPONINI in the last 168 hours. CBG: No results for input(s): GLUCAP in the last 168 hours. Iron Studies: No results for input(s): IRON, TIBC, TRANSFERRIN, FERRITIN in the last 72 hours. Studies/Results: No results  found.  ROS: As per HPI otherwise negative.    Physical Exam: Vitals:   07/27/17 0918 07/27/17 1343 07/27/17 1345 07/27/17 1401  BP:   106/78 95/64  Pulse:  97 97 79  Resp:  17 17 13   Temp:  (!) 97.2 F (36.2 C)    TempSrc:      SpO2:  100% 100% 98%  Weight: 83.9 kg (185 lb)     Height: 6\' 2"  (1.88 m)        General: Pleasant, Well developed, well nourished, in no acute distress. Head: Normocephalic, atraumatic, sclera non-icteric, mucus membranes are moist Neck: Supple. JVD not elevated. Lungs: Clear bilaterally to auscultation without wheezes, rales, or rhonchi. Breathing is unlabored. Heart: RRR with S1 S2. No murmurs, rubs, or gallops  appreciated. Abdomen: Soft, non-tender, non-distended with normoactive bowel sounds. No rebound/guarding. No obvious abdominal masses. M-S:  Strength and tone appear normal for age. Lower extremities:without edema or ischemic changes, no open wounds.  Neuro: Alert and oriented X 3. Moves all extremities spontaneously. Psych:  Responds to questions appropriately with a normal affect. Dialysis Access: RUA AVF Aneurysmal, + bruit.   Dialysis Orders: [facility] MWF  3 hr 45 min 180 NRe 400/800 83 kg 2.0 K/2.5 Ca Linear Sodium UFP 4 -Heparin 3300 units IV TIW -Hectorol 3 mcg IV TIW  BMD meds: Velphoro 500 mg 2 tabs PO TID AC  Last labs: HGB 14.2 (07/22/17) Ca 8.3 Phos 4.0 (07/15/17) PTH 631 (07/01/17)   Assessment/Plan: 1.  S/P repair R common iliac artery per Dr. Oneida Alar.  2.  ESRD -  MWF via AVF. HD today on schedule. No heparin. K+ 4.0 3.  Hypertension/volume  - Chronic hypotension on midodrine which usually is ineffective. Only slightly above OP EDW-1.5-2 liters UFG today.  4.  Anemia  - HGB 13.3 no OP ESA.  5.  Metabolic bone disease - Continue binders, VDRA when eating.  6.  Nutrition - NPO at present. Renal diet with fld restrictions when able to eat.    H. Owens Shark, NP-C 07/27/2017, 2:27 PM  [employer]  [phone]

## 2017-07-27 NOTE — Anesthesia Preprocedure Evaluation (Signed)
Anesthesia Evaluation  Patient identified by MRN, date of birth, ID band Patient awake    Reviewed: Allergy & Precautions, NPO status , Patient's Chart, lab work & pertinent test results  Airway Mallampati: II  TM Distance: >3 FB Neck ROM: Full    Dental  (+) Dental Advisory Given   Pulmonary    breath sounds clear to auscultation       Cardiovascular hypertension,  Rhythm:Regular Rate:Normal     Neuro/Psych    GI/Hepatic   Endo/Other    Renal/GU      Musculoskeletal   Abdominal   Peds  Hematology   Anesthesia Other Findings   Reproductive/Obstetrics                             Anesthesia Physical Anesthesia Plan  ASA: III  Anesthesia Plan: General   Post-op Pain Management:    Induction: Intravenous  PONV Risk Score and Plan:   Airway Management Planned: Oral ETT  Additional Equipment: CVP  Intra-op Plan:   Post-operative Plan: Extubation in OR  Informed Consent: I have reviewed the patients History and Physical, chart, labs and discussed the procedure including the risks, benefits and alternatives for the proposed anesthesia with the patient or authorized representative who has indicated his/her understanding and acceptance.   Dental advisory given  Plan Discussed with: CRNA and Anesthesiologist  Anesthesia Plan Comments:         Anesthesia Quick Evaluation

## 2017-07-27 NOTE — Op Note (Signed)
Procedure: Insertion of branch iliac device for treatment of right common iliac aneurysm  Preoperative diagnosis: Right common iliac aneurysm  Postoperative diagnosis: Same  Anesthesia: General  Assistant: Gerri Lins PA-C  Operative findings: #1 23 x 14 x 10 cm iliac branch device #2 11 x 79 overlapping 11 x 59 VBX balloon expandable stent right internal iliac #316 x 14 x 7 cm right external iliac stent #4 27 x 10 cm bridge graft between pre-existing aortic more Excluder bifurcated graft and right iliac branch graft  Operative details: After obtaining informed consent, the patient was taken the operating.  Patient was placed in supine position operating table.  After induction of general anesthesia and endotracheal intubation, the patient's entire abdomen and upper thighs were prepped and draped in usual sterile fashion.  Ultrasound was used to identify the right common femoral artery and femoral bifurcation.  An introducer needle was used to cannulate the right common femoral artery under ultrasound guidance.  An 57 Bentson wire was advanced up into the abdominal aorta under fluoroscopic guidance per tract was dilated with an 8 [place] dilator.  2 consecutive Proglide devices were placed over the wire and deployed at a 10 and 2 position. The wire was exchanged for a 035 Amplatz wire.  A 16 French dry seal sheath was then placed over this up into the right iliac system.  The patient was given 8000 units of intravenous heparin.  The 23 x 14 x 10 iliac branch device was then placed over the guidewire and advanced to just above the iliac bifurcation.  The internal limb was deployed just above the right internal iliac artery.  At this point a 79 [place] aptus sheath was advanced through the dry seal sheath and completely opposed to a hairpin angle pointing directly towards the origin of the right internal iliac artery.  Consecutive 11 x 79 and 11 x 59 VBX stents were then used to bridge the device all  the way down to the first major branch point of the right internal iliac artery.  These were deployed in the usual fashion by inflating each to 12 atm.  At this point wire access to the internal iliac was given up and the wire advanced up in the abdominal aorta.  We then bridged a pre-existing bell bottom graft to the branched graft using a 27 x 10 cm graft and this was deployed to profile and then postdilated with a coated balloon to profile.  The Coda balloon was also used to post dilate the VBX stents.  We then placed an additional stent into the right external iliac artery to extend our bifurcated device so that we had a few centimeters purchase into the right external iliac artery.  This was done with a 16 x 4 x 7 cm or excluder stent.  This was then balloon to profile with the coated balloon as well.  At this point the pigtail catheter was advanced over the guidewire into the abdominal aorta and abdominal aortogram was obtained in AP projection.  This showed good wall apposition of all of the stents with good flow into the right internal iliac and external iliac artery with no evidence of type I or type II endoleak.  The pre-existing left limb of the aorto bifurcated graft looks good with good exclusion of the left internal iliac artery and good flow in the left external iliac artery.  At this point the guidewires and sheaths were removed and the Proglide secured down and there was good  hemostasis.  The patient was given 80 mg of protamine.  The patient tolerated the procedure well and there were no complications.  The patient was taken to the holding area in stable condition.  [name], MD [facility] Office: [phone] Pager: [phone]

## 2017-07-27 NOTE — Interval H&P Note (Signed)
History and Physical Interval Note:  07/27/2017 10:23 AM  Gregory [name]  has presented today for surgery, with the diagnosis of right iliac aneurysm  The various methods of treatment have been discussed with the patient and family. After consideration of risks, benefits and other options for treatment, the patient has consented to  Procedure(s): REPAIR ILIAC ARTERY ANEURYSM POSSIBLE LEFT BRACHIAL ACCESS (Right) EMBOLECTOMY COILING (Right) as a surgical intervention .  The patient's history has been reviewed, patient examined, no change in status, stable for surgery.  I have reviewed the patient's chart and labs.  Questions were answered to the patient's satisfaction.     [name]

## 2017-07-27 NOTE — Progress Notes (Signed)
HD tx completed @ 2240 w/o problem, UF goal met, blood rinsed back, VSS, attempted do call report to primary nurse but nurse unavailable at this time, awaiting her return call

## 2017-07-28 ENCOUNTER — Encounter (HOSPITAL_COMMUNITY): Payer: Self-pay | Admitting: Vascular Surgery

## 2017-07-28 ENCOUNTER — Other Ambulatory Visit: Payer: Self-pay

## 2017-07-28 LAB — BASIC METABOLIC PANEL
Anion gap: 13 (ref 5–15)
BUN: 18 mg/dL (ref 6–20)
CALCIUM: 8 mg/dL — AB (ref 8.9–10.3)
CO2: 26 mmol/L (ref 22–32)
CREATININE: 5.64 mg/dL — AB (ref 0.61–1.24)
Chloride: 100 mmol/L — ABNORMAL LOW (ref 101–111)
GFR calc Af Amer: 11 mL/min — ABNORMAL LOW (ref >60)
GFR, EST NON AFRICAN AMERICAN: 9 mL/min — AB (ref >60)
Glucose, Bld: 78 mg/dL (ref 65–99)
POTASSIUM: 3.7 mmol/L (ref 3.5–5.1)
SODIUM: 139 mmol/L (ref 135–145)

## 2017-07-28 LAB — CBC
HCT: 37.4 % — ABNORMAL LOW (ref 39.0–52.0)
Hemoglobin: 11.7 g/dL — ABNORMAL LOW (ref 13.0–17.0)
MCH: 30.1 pg (ref 26.0–34.0)
MCHC: 31.3 g/dL (ref 30.0–36.0)
MCV: 96.1 fL (ref 78.0–100.0)
PLATELETS: 111 10*3/uL — AB (ref 150–400)
RBC: 3.89 MIL/uL — AB (ref 4.22–5.81)
RDW: 14.7 % (ref 11.5–15.5)
WBC: 4.2 10*3/uL (ref 4.0–10.5)

## 2017-07-28 MED ORDER — OXYCODONE HCL 5 MG PO TABS: 5.0000 mg | ORAL_TABLET | Freq: Four times a day (QID) | ORAL | 0 refills | Status: DC | PRN

## 2017-07-28 NOTE — Progress Notes (Addendum)
[facility] [place]  Subjective  - Doing well over all.  No new abdominal pain or increased lumbar pain.   Objective (!) 81/63 65 97.9 F (36.6 C) (Oral) 19 99%  Intake/Output Summary (Last 24 hours) at 07/28/2017 0717 Last data filed at 07/28/2017 0413 Gross per 24 hour  Intake 1552.08 ml  Output 2375 ml  Net -822.92 ml    Palpable DP 2+ B Right groin soft without hematoma Abdomin soft NTTP Heart RRR Lungs non labored breathing  Assessment/Planning: POD # 1 S/P Insertion of branch iliac device for treatment of right common iliac aneurysm  Patent stent with palpable distal pulses Chest x ray post op clear without pneumothorax HD performed yesterday Disposition stable for discharge home  F/U with Dr. Valinda Hoar in 4 weeks will schedule CTA abd/pelvis in 4 weeks   [name] 07/28/2017 7:17 AM -- Looks good.  No hematoma D/c home  [name], MD [facility] [place]: [phone] Pager: [phone]  Laboratory Lab Results: Recent Labs    07/27/17 1638 07/28/17 0301  WBC 4.8 4.2  HGB 12.7* 11.7*  HCT 39.6 37.4*  PLT 109* 111*   BMET Recent Labs    07/27/17 1638 07/28/17 0301  NA 135 139  K 4.4 3.7  CL 100* 100*  CO2 21* 26  GLUCOSE 82 78  BUN 58* 18  CREATININE 10.77* 5.64*  CALCIUM 7.5* 8.0*    COAG Lab Results  Component Value Date   INR 1.25 07/27/2017   INR 1.11 07/27/2017   INR 1.22 11/08/2014   No results found for: PTT

## 2017-07-28 NOTE — Progress Notes (Signed)
  [facility] Progress Note   Assessment/ Plan:   Dialysis Orders: [place] MWF  3 hr 45 min 180 NRe 400/800 83 kg 2.0 K/2.5 Ca Linear Sodium UFP 4 -Heparin 3300 units IV TIW -Hectorol 3 mcg IV TIW  BMD meds: Velphoro 500 mg 2 tabs PO TID AC  Last labs: HGB 14.2 (07/22/17) Ca 8.3 Phos 4.0 (07/15/17) PTH 631 (07/01/17)   Assessment/Plan: 1.  S/P repair R common iliac artery per Dr. Oneida Alar 07/27/2017 2.  ESRD -  MWF via AVF. HD 3/25 on schedule. No heparin. K+ 4.0 3.  Hypertension/volume  - Chronic hypotension on midodrine which usually is ineffective. Only slightly above OP EDW-1.5-2 liters UFG today.  4.  Anemia  - HGB 13.3 no OP ESA.  5.  Metabolic bone disease - Continue binders, VDRA when eating.  6.  Nutrition - NPO at present. Renal diet with fld restrictions when able to eat.  7. Dispo: pt for d/c today, will go to OP unit on schedule tomorrow.    Subjective:    Feeling well.  Had HD yesterday.  Eager for d/c   Objective:   BP (!) 77/67 (BP Location: Left Arm)   Pulse 74   Temp 97.9 F (36.6 C) (Oral)   Resp 19   Ht 6\' 2"  (1.88 m)   Wt 83.6 kg (184 lb 4.9 oz)   SpO2 99%   BMI 23.66 kg/m   Physical Exam: General: Pleasant, Well developed, well nourished, in no acute distress.  Eating breakfast Neck: Supple. Normal JVD Lungs: clear Heart: RRR with S1 S2. No murmurs, rubs, or gallops appreciated. Abdomen: Soft, non-tender, non-distended, NABS Lower extremities: 2+ pulses, warm, no edema Neuro: Alert and oriented X 3. Dialysis Access: RUA AVF Aneurysmal, + bruit.     Labs: BMET Recent Labs  Lab 07/27/17 0935 07/27/17 1406 07/27/17 1638 07/28/17 0301  NA 136 137 135 139  K 4.2 4.0 4.4 3.7  CL 97* 100* 100* 100*  CO2 23 23 21* 26  GLUCOSE 83 94 82 78  BUN 55* 56* 58* 18  CREATININE 10.99* 10.69* 10.77* 5.64*  CALCIUM 8.2* 7.8* 7.5* 8.0*  PHOS  --   --  5.8*  --    CBC Recent Labs  Lab 07/27/17 0935 07/27/17 1406 07/27/17 1638  07/28/17 0301  WBC 4.8 3.7* 4.8 4.2  HGB 13.8 13.3 12.7* 11.7*  HCT 43.0 41.3 39.6 37.4*  MCV 96.4 96.3 95.9 96.1  PLT 127* 96* 109* 111*    @IMGRELPRIORS @ Medications:    . aspirin EC  81 mg Oral Daily  . atorvastatin  40 mg Oral q1800  . cinacalcet  60 mg Oral QPC supper  . docusate sodium  100 mg Oral Daily  . enoxaparin (LOVENOX) injection  30 mg Subcutaneous Q24H  . oxyCODONE-acetaminophen  1 tablet Oral QID   And  . oxyCODONE  5 mg Oral QID  . pantoprazole  40 mg Oral Daily  . sucroferric oxyhydroxide  500 mg Oral TID WC     Madelon Lips, MD [facility] pgr [phone] 07/28/2017, 10:11 AM

## 2017-07-28 NOTE — Care Management Note (Signed)
Case Management Note Marvetta Gibbons RN, BSN Unit 4E-Case Manager [phone]  Patient Details  Name: Gregory [name] MRN: [id number] Date of Birth: [birth year]  Subjective/Objective:   Pt admitted s/p Insertion of branch iliac device for treatment of right common iliac aneurysm                   Action/Plan: PTA pt lived at home with spouse- plan to return home- notified by Jonelle Sidle with [facility] that pt has pre-op referral to them for any HH needs- they will f/u post op with pt for any HH needs. - no further CM needs noted for transition to home- Pt to d/c home today with wife  Expected Discharge Date:  07/28/17               Expected Discharge Plan:  [place]  In-House Referral:  NA  Discharge planning Services  CM Consult  Post Acute Care Choice:  Home Health Choice offered to:     DME Arranged:    DME Agency:     HH Arranged:    [place] Agency:  [facility]  Status of Service:  Completed, signed off  If discussed at [place] of Stay Meetings, dates discussed:    Discharge Disposition: home/home health   Additional Comments:  Dawayne Patricia, RN 07/28/2017, 11:50 AM

## 2017-07-28 NOTE — Discharge Summary (Signed)
[facility] Discharge Summary   Patient ID:  Gregory [name] MRN: [id number] DOB/AGE: February 23, 1951 67 y.o.  Admit date: 07/27/2017 Discharge date: 07/28/2017 Date of Surgery: 07/27/2017 Surgeon: Surgeon(s): Elam Dutch, MD  Admission Diagnosis: right iliac aneurysm  Discharge Diagnoses:  right iliac aneurysm  Secondary Diagnoses: Past Medical History:  Diagnosis Date  . Anemia in chronic kidney disease   . Arthritis    "hands; basically all my joints" (11/08/2014)  . CHF (congestive heart failure) ([place])   . Chronic back pain    "mostly lower" (11/08/2014)  . End stage renal disease ([place])    pt does not urinate; pt. states that he does dialysis on MWF; [place]" (11/08/2014)  . GERD (gastroesophageal reflux disease)    "sometimes" (11/08/2014)  . Gout    "hands" (11/08/2014)  . Heart failure with reduced ejection fraction ([place])   . Hypertension     Procedure(s): REPAIR ILIAC ARTERY ANEURYSM  Discharged Condition: good  HPI:  67 y/o male who underwent aortic and iliac artery stent graft repair July 2016 primarily for iliac artery aneurysms.  His recent ultrasound shows an endo leak in the right common iliac aneurysm with some growth from 3.1 cm to 3.2 cm.  The left common iliac aneurysm was 3.7 cm preoperatively. There is an endoleak leak feeding the right iliac aneurysm.        Hospital Course:  Gregory [name] is a 67 y.o. male is S/P Right Procedure(s): REPAIR ILIAC ARTERY ANEURYSM  Patent stent with palpable distal pulses Chest x ray post op clear without pneumothorax HD performed yesterday Disposition stable for discharge home  F/U with Dr. Valinda Hoar in 4 weeks will schedule CTA abd/pelvis in 4 weeks       Consults:  Treatment Team:  Madelon Lips, MD  Significant Diagnostic Studies: CBC Lab Results  Component Value Date   WBC 4.2 07/28/2017   HGB 11.7 (L) 07/28/2017   HCT 37.4 (L) 07/28/2017   MCV 96.1 07/28/2017   PLT 111  (L) 07/28/2017    BMET    Component Value Date/Time   NA 139 07/28/2017 0301   NA 141 02/23/2014 0948   K 3.7 07/28/2017 0301   CL 100 (L) 07/28/2017 0301   CO2 26 07/28/2017 0301   GLUCOSE 78 07/28/2017 0301   BUN 18 07/28/2017 0301   BUN 21 02/23/2014 0948   CREATININE 5.64 (H) 07/28/2017 0301   CREATININE 5.28 (H) 11/24/2013 1008   CALCIUM 8.0 (L) 07/28/2017 0301   GFRNONAA 9 (L) 07/28/2017 0301   GFRAA 11 (L) 07/28/2017 0301   COAG Lab Results  Component Value Date   INR 1.25 07/27/2017   INR 1.11 07/27/2017   INR 1.22 11/08/2014     Disposition:  Discharge to :Home Discharge Instructions    Call MD for:  redness, tenderness, or signs of infection (pain, swelling, bleeding, redness, odor or green/yellow discharge around incision site)   Complete by:  As directed    Call MD for:  redness, tenderness, or signs of infection (pain, swelling, bleeding, redness, odor or green/yellow discharge around incision site)   Complete by:  As directed    Call MD for:  severe or increased pain, loss or decreased feeling  in affected limb(s)   Complete by:  As directed    Call MD for:  severe or increased pain, loss or decreased feeling  in affected limb(s)   Complete by:  As directed    Call MD for:  temperature >100.5   Complete by:  As directed    Call MD for:  temperature >100.5   Complete by:  As directed    Resume previous diet   Complete by:  As directed    Resume previous diet   Complete by:  As directed      Allergies as of 07/28/2017   No Known Allergies     Medication List    TAKE these medications   aspirin EC 81 MG tablet Take 1 tablet (81 mg total) by mouth daily.   atorvastatin 40 MG tablet Commonly known as:  LIPITOR Take 1 tablet (40 mg total) by mouth daily.   calcium carbonate 1250 (500 Ca) MG chewable tablet Commonly known as:  OS-CAL Chew 2 tablets by mouth at bedtime.   cinacalcet 60 MG tablet Commonly known as:  SENSIPAR Take 60 mg by  mouth daily after supper.   colchicine 0.6 MG tablet Take 0.6 mg by mouth daily as needed (for gout flare ups.).   DIALYVITE 800 PO Take 1 tablet by mouth every Monday, Wednesday, and Friday with hemodialysis. After dialysis.   morphine 15 MG tablet Commonly known as:  MSIR Take 15 mg by mouth every 6 (six) hours as needed for severe pain (for pain.).   oxyCODONE 5 MG immediate release tablet Commonly known as:  Oxy IR/ROXICODONE Take 1 tablet (5 mg total) by mouth every 6 (six) hours as needed for severe pain.   oxyCODONE-acetaminophen 10-325 MG tablet Commonly known as:  PERCOCET Take 1 tablet by mouth 4 (four) times daily.   predniSONE 5 MG tablet Commonly known as:  DELTASONE Take 5 mg by mouth daily.   promethazine 25 MG tablet Commonly known as:  PHENERGAN Take 25 mg by mouth every 6 (six) hours as needed for nausea or vomiting.   VELPHORO 500 MG chewable tablet Generic drug:  sucroferric oxyhydroxide Chew 500 mg by mouth 3 (three) times daily with meals.      Verbal and written Discharge instructions given to the patient. Wound care per Discharge AVS Follow-up Information    Elam Dutch, MD Follow up in 4 week(s).   Specialties:  Vascular Surgery, Cardiology Why:  f/u in 4 weeks  Contact information: [address] [place] [address] [phone]           Signed: [name] 07/28/2017, 9:51 AM

## 2017-07-28 NOTE — Discharge Instructions (Signed)
   [facility] [place]   Discharge Instructions  Endovascular Aortic Aneurysm Repair  Please refer to the following instructions for your post-procedure care. Your surgeon or Physician Assistant will discuss any changes with you.  Activity  You are encouraged to walk as much as you can. You can slowly return to normal activities but must avoid strenuous activity and heavy lifting until your doctor tells you it's OK. Avoid activities such as vacuuming or swinging a gold club. It is normal to feel tired for several weeks after your surgery. Do not drive until your doctor gives the OK and you are no longer taking prescription pain medications. It is also normal to have difficulty with sleep habits, eating, and bowel movements after surgery. These will go away with time.  Bathing/Showering  You may shower after you go home. If you have an incision, do not soak in a bathtub, hot tub, or swim until the incision heals completely.  Incision Care  Shower every day. Clean your incision with mild soap and water. Pat the area dry with a clean towel. You do not need a bandage unless otherwise instructed. Do not apply any ointments or creams to your incision. If you clothing is irritating, you may cover your incision with a dry gauze pad.  Diet  Resume your normal diet. There are no special food restrictions following this procedure. A low fat/low cholesterol diet is recommended for all patients with vascular disease. In order to heal from your surgery, it is CRITICAL to get adequate nutrition. Your body requires vitamins, minerals, and protein. Vegetables are the best source of vitamins and minerals. Vegetables also provide the perfect balance of protein. Processed food has little nutritional value, so try to avoid this.  Medications  Resume taking all of your medications unless your doctor or nurse practitioner tells you not to. If your incision is causing pain, you may take  over-the-counter pain relievers such as acetaminophen (Tylenol). If you were prescribed a stronger pain medication, please be aware these medications can cause nausea and constipation. Prevent nausea by taking the medication with a snack or meal. Avoid constipation by drinking plenty of fluids and eating foods with a high amount of fiber, such as fruits, vegetables, and grains. Do not take Tylenol if you are taking prescription pain medications.   Follow up  Our office will schedule a follow-up appointment with a C.T. scan 3-4 weeks after your surgery.  Please call us immediately for any of the following conditions  Severe or worsening pain in your legs or feet or in your abdomen back or chest. Increased pain, redness, drainage (pus) from your incision sit. Increased abdominal pain, bloating, nausea, vomiting or persistent diarrhea. Fever of 101 degrees or higher. Swelling in your leg (s),  Reduce your risk of vascular disease  Stop smoking. If you would like help call [facility] at 1-800-QUIT-NOW (1-800-784-8669) or [place] at 336-586-4000. Manage your cholesterol Maintain a desired weight Control your diabetes Keep your blood pressure down  If you have questions, please call the office at [phone].   

## 2017-07-29 DIAGNOSIS — D631 Anemia in chronic kidney disease: Secondary | ICD-10-CM | POA: Diagnosis not present

## 2017-07-29 DIAGNOSIS — N2581 Secondary hyperparathyroidism of renal origin: Secondary | ICD-10-CM | POA: Diagnosis not present

## 2017-07-29 DIAGNOSIS — N186 End stage renal disease: Secondary | ICD-10-CM | POA: Diagnosis not present

## 2017-07-29 NOTE — Consult Note (Signed)
            [facility] CM Primary Care Navigator  07/29/2017  Gregory [name] [birth year] [id number]  Attempt toseepatient at the bedside to identify possible discharge needsbuthe was already discharge homeperstaffreport with home health services.  Per chart review, patient was admitted for repair of right iliac artery aneurysm.   Per renal MD note, patient has end stage renal disease (ESRD), has no primary care provider listed but being followed up by [facility]. He goes for hemodialysis M-W-F and is an excellent dialysis patient, never misses or shortens treatments, adherent to medications and HD-hemodialysis prescription.  Patient has discharge instruction to follow-up withvascular surgery (Dr. Oneida Alar) in 4 weeks.   For additional questions please contact:  Edwena Felty A. Kynsleigh Westendorf, BSN, RN-BC [facility] PRIMARY CARE Navigator Cell: [phone]

## 2017-07-31 DIAGNOSIS — N186 End stage renal disease: Secondary | ICD-10-CM | POA: Diagnosis not present

## 2017-07-31 DIAGNOSIS — D631 Anemia in chronic kidney disease: Secondary | ICD-10-CM | POA: Diagnosis not present

## 2017-07-31 DIAGNOSIS — N2581 Secondary hyperparathyroidism of renal origin: Secondary | ICD-10-CM | POA: Diagnosis not present

## 2017-08-03 DIAGNOSIS — I120 Hypertensive chronic kidney disease with stage 5 chronic kidney disease or end stage renal disease: Secondary | ICD-10-CM | POA: Diagnosis not present

## 2017-08-03 DIAGNOSIS — Z992 Dependence on renal dialysis: Secondary | ICD-10-CM | POA: Diagnosis not present

## 2017-08-03 DIAGNOSIS — D509 Iron deficiency anemia, unspecified: Secondary | ICD-10-CM | POA: Diagnosis not present

## 2017-08-03 DIAGNOSIS — N2581 Secondary hyperparathyroidism of renal origin: Secondary | ICD-10-CM | POA: Diagnosis not present

## 2017-08-03 DIAGNOSIS — D631 Anemia in chronic kidney disease: Secondary | ICD-10-CM | POA: Diagnosis not present

## 2017-08-03 DIAGNOSIS — N186 End stage renal disease: Secondary | ICD-10-CM | POA: Diagnosis not present

## 2017-08-05 DIAGNOSIS — D631 Anemia in chronic kidney disease: Secondary | ICD-10-CM | POA: Diagnosis not present

## 2017-08-05 DIAGNOSIS — N2581 Secondary hyperparathyroidism of renal origin: Secondary | ICD-10-CM | POA: Diagnosis not present

## 2017-08-05 DIAGNOSIS — D509 Iron deficiency anemia, unspecified: Secondary | ICD-10-CM | POA: Diagnosis not present

## 2017-08-05 DIAGNOSIS — N186 End stage renal disease: Secondary | ICD-10-CM | POA: Diagnosis not present

## 2017-08-06 ENCOUNTER — Other Ambulatory Visit: Payer: Self-pay

## 2017-08-06 DIAGNOSIS — Z48812 Encounter for surgical aftercare following surgery on the circulatory system: Secondary | ICD-10-CM

## 2017-08-06 DIAGNOSIS — I723 Aneurysm of iliac artery: Secondary | ICD-10-CM

## 2017-08-07 DIAGNOSIS — D631 Anemia in chronic kidney disease: Secondary | ICD-10-CM | POA: Diagnosis not present

## 2017-08-07 DIAGNOSIS — D509 Iron deficiency anemia, unspecified: Secondary | ICD-10-CM | POA: Diagnosis not present

## 2017-08-07 DIAGNOSIS — N186 End stage renal disease: Secondary | ICD-10-CM | POA: Diagnosis not present

## 2017-08-07 DIAGNOSIS — N2581 Secondary hyperparathyroidism of renal origin: Secondary | ICD-10-CM | POA: Diagnosis not present

## 2017-08-10 DIAGNOSIS — N186 End stage renal disease: Secondary | ICD-10-CM | POA: Diagnosis not present

## 2017-08-10 DIAGNOSIS — N2581 Secondary hyperparathyroidism of renal origin: Secondary | ICD-10-CM | POA: Diagnosis not present

## 2017-08-10 DIAGNOSIS — D631 Anemia in chronic kidney disease: Secondary | ICD-10-CM | POA: Diagnosis not present

## 2017-08-10 DIAGNOSIS — D509 Iron deficiency anemia, unspecified: Secondary | ICD-10-CM | POA: Diagnosis not present

## 2017-08-12 DIAGNOSIS — D509 Iron deficiency anemia, unspecified: Secondary | ICD-10-CM | POA: Diagnosis not present

## 2017-08-12 DIAGNOSIS — N186 End stage renal disease: Secondary | ICD-10-CM | POA: Diagnosis not present

## 2017-08-12 DIAGNOSIS — D631 Anemia in chronic kidney disease: Secondary | ICD-10-CM | POA: Diagnosis not present

## 2017-08-12 DIAGNOSIS — N2581 Secondary hyperparathyroidism of renal origin: Secondary | ICD-10-CM | POA: Diagnosis not present

## 2017-08-14 DIAGNOSIS — D509 Iron deficiency anemia, unspecified: Secondary | ICD-10-CM | POA: Diagnosis not present

## 2017-08-14 DIAGNOSIS — D631 Anemia in chronic kidney disease: Secondary | ICD-10-CM | POA: Diagnosis not present

## 2017-08-14 DIAGNOSIS — N2581 Secondary hyperparathyroidism of renal origin: Secondary | ICD-10-CM | POA: Diagnosis not present

## 2017-08-14 DIAGNOSIS — N186 End stage renal disease: Secondary | ICD-10-CM | POA: Diagnosis not present

## 2017-08-17 DIAGNOSIS — D631 Anemia in chronic kidney disease: Secondary | ICD-10-CM | POA: Diagnosis not present

## 2017-08-17 DIAGNOSIS — N186 End stage renal disease: Secondary | ICD-10-CM | POA: Diagnosis not present

## 2017-08-17 DIAGNOSIS — N2581 Secondary hyperparathyroidism of renal origin: Secondary | ICD-10-CM | POA: Diagnosis not present

## 2017-08-17 DIAGNOSIS — D509 Iron deficiency anemia, unspecified: Secondary | ICD-10-CM | POA: Diagnosis not present

## 2017-08-19 ENCOUNTER — Telehealth: Payer: Self-pay | Admitting: Vascular Surgery

## 2017-08-19 DIAGNOSIS — D509 Iron deficiency anemia, unspecified: Secondary | ICD-10-CM | POA: Diagnosis not present

## 2017-08-19 DIAGNOSIS — D631 Anemia in chronic kidney disease: Secondary | ICD-10-CM | POA: Diagnosis not present

## 2017-08-19 DIAGNOSIS — N2581 Secondary hyperparathyroidism of renal origin: Secondary | ICD-10-CM | POA: Diagnosis not present

## 2017-08-19 DIAGNOSIS — N186 End stage renal disease: Secondary | ICD-10-CM | POA: Diagnosis not present

## 2017-08-19 NOTE — Telephone Encounter (Signed)
-----   Message from Mena Goes, RN sent at 07/28/2017  5:26 PM EDT ----- Regarding: 4 weeks with CTA   ----- Message ----- From: Ulyses Amor, PA-C Sent: 07/28/2017   7:32 AM To: Vvs Charge Pool  F/U with Dr. Oneida Alar in 4 weeks s/p iliac stent needs CTA Abd/Pelvis for visit

## 2017-08-19 NOTE — Telephone Encounter (Signed)
Sorry forgot to add info  LVM for CT & OV on 5/2  Mld lttr

## 2017-08-21 DIAGNOSIS — D509 Iron deficiency anemia, unspecified: Secondary | ICD-10-CM | POA: Diagnosis not present

## 2017-08-21 DIAGNOSIS — N2581 Secondary hyperparathyroidism of renal origin: Secondary | ICD-10-CM | POA: Diagnosis not present

## 2017-08-21 DIAGNOSIS — D631 Anemia in chronic kidney disease: Secondary | ICD-10-CM | POA: Diagnosis not present

## 2017-08-21 DIAGNOSIS — N186 End stage renal disease: Secondary | ICD-10-CM | POA: Diagnosis not present

## 2017-08-24 DIAGNOSIS — N186 End stage renal disease: Secondary | ICD-10-CM | POA: Diagnosis not present

## 2017-08-24 DIAGNOSIS — D631 Anemia in chronic kidney disease: Secondary | ICD-10-CM | POA: Diagnosis not present

## 2017-08-24 DIAGNOSIS — N2581 Secondary hyperparathyroidism of renal origin: Secondary | ICD-10-CM | POA: Diagnosis not present

## 2017-08-24 DIAGNOSIS — D509 Iron deficiency anemia, unspecified: Secondary | ICD-10-CM | POA: Diagnosis not present

## 2017-08-26 DIAGNOSIS — N2581 Secondary hyperparathyroidism of renal origin: Secondary | ICD-10-CM | POA: Diagnosis not present

## 2017-08-26 DIAGNOSIS — N186 End stage renal disease: Secondary | ICD-10-CM | POA: Diagnosis not present

## 2017-08-26 DIAGNOSIS — D631 Anemia in chronic kidney disease: Secondary | ICD-10-CM | POA: Diagnosis not present

## 2017-08-26 DIAGNOSIS — D509 Iron deficiency anemia, unspecified: Secondary | ICD-10-CM | POA: Diagnosis not present

## 2017-08-27 DIAGNOSIS — H353131 Nonexudative age-related macular degeneration, bilateral, early dry stage: Secondary | ICD-10-CM | POA: Diagnosis not present

## 2017-08-27 DIAGNOSIS — H2513 Age-related nuclear cataract, bilateral: Secondary | ICD-10-CM | POA: Diagnosis not present

## 2017-08-27 DIAGNOSIS — H40013 Open angle with borderline findings, low risk, bilateral: Secondary | ICD-10-CM | POA: Diagnosis not present

## 2017-08-28 DIAGNOSIS — D631 Anemia in chronic kidney disease: Secondary | ICD-10-CM | POA: Diagnosis not present

## 2017-08-28 DIAGNOSIS — N2581 Secondary hyperparathyroidism of renal origin: Secondary | ICD-10-CM | POA: Diagnosis not present

## 2017-08-28 DIAGNOSIS — N186 End stage renal disease: Secondary | ICD-10-CM | POA: Diagnosis not present

## 2017-08-28 DIAGNOSIS — D509 Iron deficiency anemia, unspecified: Secondary | ICD-10-CM | POA: Diagnosis not present

## 2017-08-31 DIAGNOSIS — N186 End stage renal disease: Secondary | ICD-10-CM | POA: Diagnosis not present

## 2017-08-31 DIAGNOSIS — D631 Anemia in chronic kidney disease: Secondary | ICD-10-CM | POA: Diagnosis not present

## 2017-08-31 DIAGNOSIS — N2581 Secondary hyperparathyroidism of renal origin: Secondary | ICD-10-CM | POA: Diagnosis not present

## 2017-08-31 DIAGNOSIS — D509 Iron deficiency anemia, unspecified: Secondary | ICD-10-CM | POA: Diagnosis not present

## 2017-09-02 DIAGNOSIS — N186 End stage renal disease: Secondary | ICD-10-CM | POA: Diagnosis not present

## 2017-09-02 DIAGNOSIS — Z992 Dependence on renal dialysis: Secondary | ICD-10-CM | POA: Diagnosis not present

## 2017-09-02 DIAGNOSIS — N2581 Secondary hyperparathyroidism of renal origin: Secondary | ICD-10-CM | POA: Diagnosis not present

## 2017-09-02 DIAGNOSIS — I120 Hypertensive chronic kidney disease with stage 5 chronic kidney disease or end stage renal disease: Secondary | ICD-10-CM | POA: Diagnosis not present

## 2017-09-02 DIAGNOSIS — D631 Anemia in chronic kidney disease: Secondary | ICD-10-CM | POA: Diagnosis not present

## 2017-09-03 ENCOUNTER — Encounter: Payer: Self-pay | Admitting: Vascular Surgery

## 2017-09-03 ENCOUNTER — Ambulatory Visit
Admission: RE | Admit: 2017-09-03 | Discharge: 2017-09-03 | Disposition: A | Discharge status: Home / Self Care | Payer: Medicare Other | Source: Ambulatory Visit | Attending: Vascular Surgery | Admitting: Vascular Surgery

## 2017-09-03 ENCOUNTER — Ambulatory Visit (INDEPENDENT_AMBULATORY_CARE_PROVIDER_SITE_OTHER): Payer: Self-pay | Admitting: Vascular Surgery

## 2017-09-03 ENCOUNTER — Other Ambulatory Visit: Payer: Self-pay

## 2017-09-03 VITALS — BP 103/69 | HR 84 | Temp 98.9°F | Resp 16 | Ht 74.0 in | Wt 183.0 lb

## 2017-09-03 DIAGNOSIS — Z48812 Encounter for surgical aftercare following surgery on the circulatory system: Secondary | ICD-10-CM

## 2017-09-03 DIAGNOSIS — I723 Aneurysm of iliac artery: Secondary | ICD-10-CM

## 2017-09-03 MED ORDER — IOPAMIDOL (ISOVUE-370) INJECTION 76%
75.0000 mL | Freq: Once | INTRAVENOUS | Status: AC | PRN
  Administered 2017-09-03: 75 mL via INTRAVENOUS

## 2017-09-03 NOTE — Progress Notes (Signed)
Patient is a 67 year old male who returns for follow-up today after recent branched stent graft repair of a right common iliac aneurysm.Pt is a 67 y/o m s/p right iliac branch device for right common iliac aneurysm 3/19.  He had prior bifurcated stent graft for treatment of common iliac aneurysms with prior left internal iliac coil.  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  He denies any groin or abdominal pain  PE:  Vitals:   09/03/17 1546  BP: 103/69  Pulse: 84  Resp: 16  Temp: 98.9 F (37.2 C)  TempSrc: Oral  SpO2: 95%  Weight: 183 lb (83 kg)  Height: 6\' 2"  (1.88 m)    Abdomen soft non tender no mass Extremities 2+ femorals no mass  CTA abdomen pelvis today.  No endoleak common iliac aneurysms excluded.  Good flow into right internal.  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  If that looks good probably once yearly [place].  Pt also complained that his current pain meds are not as good as he received in hospital.  He sees Dr Nelva Bush for chronic back pain.  We will try to move up this appt  [name], MD [facility] [place] Office: [phone] Pager: [phone]

## 2017-09-04 DIAGNOSIS — N2581 Secondary hyperparathyroidism of renal origin: Secondary | ICD-10-CM | POA: Diagnosis not present

## 2017-09-04 DIAGNOSIS — N186 End stage renal disease: Secondary | ICD-10-CM | POA: Diagnosis not present

## 2017-09-04 DIAGNOSIS — D631 Anemia in chronic kidney disease: Secondary | ICD-10-CM | POA: Diagnosis not present

## 2017-09-07 DIAGNOSIS — D631 Anemia in chronic kidney disease: Secondary | ICD-10-CM | POA: Diagnosis not present

## 2017-09-07 DIAGNOSIS — N2581 Secondary hyperparathyroidism of renal origin: Secondary | ICD-10-CM | POA: Diagnosis not present

## 2017-09-07 DIAGNOSIS — N186 End stage renal disease: Secondary | ICD-10-CM | POA: Diagnosis not present

## 2017-09-08 DIAGNOSIS — G894 Chronic pain syndrome: Secondary | ICD-10-CM | POA: Diagnosis not present

## 2017-09-09 DIAGNOSIS — D631 Anemia in chronic kidney disease: Secondary | ICD-10-CM | POA: Diagnosis not present

## 2017-09-09 DIAGNOSIS — N2581 Secondary hyperparathyroidism of renal origin: Secondary | ICD-10-CM | POA: Diagnosis not present

## 2017-09-09 DIAGNOSIS — N186 End stage renal disease: Secondary | ICD-10-CM | POA: Diagnosis not present

## 2017-09-11 DIAGNOSIS — N2581 Secondary hyperparathyroidism of renal origin: Secondary | ICD-10-CM | POA: Diagnosis not present

## 2017-09-11 DIAGNOSIS — D631 Anemia in chronic kidney disease: Secondary | ICD-10-CM | POA: Diagnosis not present

## 2017-09-11 DIAGNOSIS — N186 End stage renal disease: Secondary | ICD-10-CM | POA: Diagnosis not present

## 2017-09-14 DIAGNOSIS — N186 End stage renal disease: Secondary | ICD-10-CM | POA: Diagnosis not present

## 2017-09-14 DIAGNOSIS — D631 Anemia in chronic kidney disease: Secondary | ICD-10-CM | POA: Diagnosis not present

## 2017-09-14 DIAGNOSIS — N2581 Secondary hyperparathyroidism of renal origin: Secondary | ICD-10-CM | POA: Diagnosis not present

## 2017-09-16 DIAGNOSIS — D631 Anemia in chronic kidney disease: Secondary | ICD-10-CM | POA: Diagnosis not present

## 2017-09-16 DIAGNOSIS — N2581 Secondary hyperparathyroidism of renal origin: Secondary | ICD-10-CM | POA: Diagnosis not present

## 2017-09-16 DIAGNOSIS — N186 End stage renal disease: Secondary | ICD-10-CM | POA: Diagnosis not present

## 2017-09-18 DIAGNOSIS — N186 End stage renal disease: Secondary | ICD-10-CM | POA: Diagnosis not present

## 2017-09-18 DIAGNOSIS — N2581 Secondary hyperparathyroidism of renal origin: Secondary | ICD-10-CM | POA: Diagnosis not present

## 2017-09-18 DIAGNOSIS — D631 Anemia in chronic kidney disease: Secondary | ICD-10-CM | POA: Diagnosis not present

## 2017-09-21 DIAGNOSIS — N2581 Secondary hyperparathyroidism of renal origin: Secondary | ICD-10-CM | POA: Diagnosis not present

## 2017-09-21 DIAGNOSIS — D631 Anemia in chronic kidney disease: Secondary | ICD-10-CM | POA: Diagnosis not present

## 2017-09-21 DIAGNOSIS — N186 End stage renal disease: Secondary | ICD-10-CM | POA: Diagnosis not present

## 2017-09-23 DIAGNOSIS — D631 Anemia in chronic kidney disease: Secondary | ICD-10-CM | POA: Diagnosis not present

## 2017-09-23 DIAGNOSIS — N2581 Secondary hyperparathyroidism of renal origin: Secondary | ICD-10-CM | POA: Diagnosis not present

## 2017-09-23 DIAGNOSIS — N186 End stage renal disease: Secondary | ICD-10-CM | POA: Diagnosis not present

## 2017-09-25 DIAGNOSIS — N186 End stage renal disease: Secondary | ICD-10-CM | POA: Diagnosis not present

## 2017-09-25 DIAGNOSIS — D631 Anemia in chronic kidney disease: Secondary | ICD-10-CM | POA: Diagnosis not present

## 2017-09-25 DIAGNOSIS — N2581 Secondary hyperparathyroidism of renal origin: Secondary | ICD-10-CM | POA: Diagnosis not present

## 2017-09-28 DIAGNOSIS — N2581 Secondary hyperparathyroidism of renal origin: Secondary | ICD-10-CM | POA: Diagnosis not present

## 2017-09-28 DIAGNOSIS — N186 End stage renal disease: Secondary | ICD-10-CM | POA: Diagnosis not present

## 2017-09-28 DIAGNOSIS — D631 Anemia in chronic kidney disease: Secondary | ICD-10-CM | POA: Diagnosis not present

## 2017-09-30 DIAGNOSIS — N186 End stage renal disease: Secondary | ICD-10-CM | POA: Diagnosis not present

## 2017-09-30 DIAGNOSIS — D631 Anemia in chronic kidney disease: Secondary | ICD-10-CM | POA: Diagnosis not present

## 2017-09-30 DIAGNOSIS — N2581 Secondary hyperparathyroidism of renal origin: Secondary | ICD-10-CM | POA: Diagnosis not present

## 2017-10-02 DIAGNOSIS — N186 End stage renal disease: Secondary | ICD-10-CM | POA: Diagnosis not present

## 2017-10-02 DIAGNOSIS — D631 Anemia in chronic kidney disease: Secondary | ICD-10-CM | POA: Diagnosis not present

## 2017-10-02 DIAGNOSIS — N2581 Secondary hyperparathyroidism of renal origin: Secondary | ICD-10-CM | POA: Diagnosis not present

## 2017-10-03 DIAGNOSIS — I120 Hypertensive chronic kidney disease with stage 5 chronic kidney disease or end stage renal disease: Secondary | ICD-10-CM | POA: Diagnosis not present

## 2017-10-03 DIAGNOSIS — N186 End stage renal disease: Secondary | ICD-10-CM | POA: Diagnosis not present

## 2017-10-03 DIAGNOSIS — Z992 Dependence on renal dialysis: Secondary | ICD-10-CM | POA: Diagnosis not present

## 2017-10-05 DIAGNOSIS — N2581 Secondary hyperparathyroidism of renal origin: Secondary | ICD-10-CM | POA: Diagnosis not present

## 2017-10-05 DIAGNOSIS — D631 Anemia in chronic kidney disease: Secondary | ICD-10-CM | POA: Diagnosis not present

## 2017-10-05 DIAGNOSIS — N186 End stage renal disease: Secondary | ICD-10-CM | POA: Diagnosis not present

## 2017-10-06 DIAGNOSIS — M545 Low back pain: Secondary | ICD-10-CM | POA: Diagnosis not present

## 2017-10-06 DIAGNOSIS — Z79891 Long term (current) use of opiate analgesic: Secondary | ICD-10-CM | POA: Diagnosis not present

## 2017-10-06 DIAGNOSIS — M5136 Other intervertebral disc degeneration, lumbar region: Secondary | ICD-10-CM | POA: Diagnosis not present

## 2017-10-06 DIAGNOSIS — G894 Chronic pain syndrome: Secondary | ICD-10-CM | POA: Diagnosis not present

## 2017-10-07 DIAGNOSIS — N2581 Secondary hyperparathyroidism of renal origin: Secondary | ICD-10-CM | POA: Diagnosis not present

## 2017-10-07 DIAGNOSIS — D631 Anemia in chronic kidney disease: Secondary | ICD-10-CM | POA: Diagnosis not present

## 2017-10-07 DIAGNOSIS — N186 End stage renal disease: Secondary | ICD-10-CM | POA: Diagnosis not present

## 2017-10-09 DIAGNOSIS — N186 End stage renal disease: Secondary | ICD-10-CM | POA: Diagnosis not present

## 2017-10-09 DIAGNOSIS — D631 Anemia in chronic kidney disease: Secondary | ICD-10-CM | POA: Diagnosis not present

## 2017-10-09 DIAGNOSIS — N2581 Secondary hyperparathyroidism of renal origin: Secondary | ICD-10-CM | POA: Diagnosis not present

## 2017-10-12 DIAGNOSIS — D631 Anemia in chronic kidney disease: Secondary | ICD-10-CM | POA: Diagnosis not present

## 2017-10-12 DIAGNOSIS — N186 End stage renal disease: Secondary | ICD-10-CM | POA: Diagnosis not present

## 2017-10-12 DIAGNOSIS — N2581 Secondary hyperparathyroidism of renal origin: Secondary | ICD-10-CM | POA: Diagnosis not present

## 2017-10-14 DIAGNOSIS — N2581 Secondary hyperparathyroidism of renal origin: Secondary | ICD-10-CM | POA: Diagnosis not present

## 2017-10-14 DIAGNOSIS — N186 End stage renal disease: Secondary | ICD-10-CM | POA: Diagnosis not present

## 2017-10-14 DIAGNOSIS — D631 Anemia in chronic kidney disease: Secondary | ICD-10-CM | POA: Diagnosis not present

## 2017-10-15 ENCOUNTER — Other Ambulatory Visit: Payer: Self-pay

## 2017-10-15 ENCOUNTER — Other Ambulatory Visit: Payer: Self-pay | Admitting: Vascular Surgery

## 2017-10-15 DIAGNOSIS — I723 Aneurysm of iliac artery: Secondary | ICD-10-CM

## 2017-10-16 DIAGNOSIS — D631 Anemia in chronic kidney disease: Secondary | ICD-10-CM | POA: Diagnosis not present

## 2017-10-16 DIAGNOSIS — N2581 Secondary hyperparathyroidism of renal origin: Secondary | ICD-10-CM | POA: Diagnosis not present

## 2017-10-16 DIAGNOSIS — N186 End stage renal disease: Secondary | ICD-10-CM | POA: Diagnosis not present

## 2017-10-19 DIAGNOSIS — D631 Anemia in chronic kidney disease: Secondary | ICD-10-CM | POA: Diagnosis not present

## 2017-10-19 DIAGNOSIS — N186 End stage renal disease: Secondary | ICD-10-CM | POA: Diagnosis not present

## 2017-10-19 DIAGNOSIS — N2581 Secondary hyperparathyroidism of renal origin: Secondary | ICD-10-CM | POA: Diagnosis not present

## 2017-10-21 DIAGNOSIS — N186 End stage renal disease: Secondary | ICD-10-CM | POA: Diagnosis not present

## 2017-10-21 DIAGNOSIS — N2581 Secondary hyperparathyroidism of renal origin: Secondary | ICD-10-CM | POA: Diagnosis not present

## 2017-10-21 DIAGNOSIS — D631 Anemia in chronic kidney disease: Secondary | ICD-10-CM | POA: Diagnosis not present

## 2017-10-22 DIAGNOSIS — H401122 Primary open-angle glaucoma, left eye, moderate stage: Secondary | ICD-10-CM | POA: Diagnosis not present

## 2017-10-22 DIAGNOSIS — H401111 Primary open-angle glaucoma, right eye, mild stage: Secondary | ICD-10-CM | POA: Diagnosis not present

## 2017-10-23 DIAGNOSIS — N2581 Secondary hyperparathyroidism of renal origin: Secondary | ICD-10-CM | POA: Diagnosis not present

## 2017-10-23 DIAGNOSIS — D631 Anemia in chronic kidney disease: Secondary | ICD-10-CM | POA: Diagnosis not present

## 2017-10-23 DIAGNOSIS — N186 End stage renal disease: Secondary | ICD-10-CM | POA: Diagnosis not present

## 2017-10-26 DIAGNOSIS — N2581 Secondary hyperparathyroidism of renal origin: Secondary | ICD-10-CM | POA: Diagnosis not present

## 2017-10-26 DIAGNOSIS — D631 Anemia in chronic kidney disease: Secondary | ICD-10-CM | POA: Diagnosis not present

## 2017-10-26 DIAGNOSIS — N186 End stage renal disease: Secondary | ICD-10-CM | POA: Diagnosis not present

## 2017-10-28 DIAGNOSIS — N186 End stage renal disease: Secondary | ICD-10-CM | POA: Diagnosis not present

## 2017-10-28 DIAGNOSIS — D631 Anemia in chronic kidney disease: Secondary | ICD-10-CM | POA: Diagnosis not present

## 2017-10-28 DIAGNOSIS — N2581 Secondary hyperparathyroidism of renal origin: Secondary | ICD-10-CM | POA: Diagnosis not present

## 2017-10-29 ENCOUNTER — Other Ambulatory Visit: Payer: Self-pay

## 2017-10-29 DIAGNOSIS — N186 End stage renal disease: Secondary | ICD-10-CM

## 2017-10-29 DIAGNOSIS — T82510A Breakdown (mechanical) of surgically created arteriovenous fistula, initial encounter: Secondary | ICD-10-CM

## 2017-10-29 DIAGNOSIS — Z992 Dependence on renal dialysis: Secondary | ICD-10-CM

## 2017-10-30 DIAGNOSIS — D631 Anemia in chronic kidney disease: Secondary | ICD-10-CM | POA: Diagnosis not present

## 2017-10-30 DIAGNOSIS — N186 End stage renal disease: Secondary | ICD-10-CM | POA: Diagnosis not present

## 2017-10-30 DIAGNOSIS — N2581 Secondary hyperparathyroidism of renal origin: Secondary | ICD-10-CM | POA: Diagnosis not present

## 2017-11-02 DIAGNOSIS — N186 End stage renal disease: Secondary | ICD-10-CM | POA: Diagnosis not present

## 2017-11-02 DIAGNOSIS — Z992 Dependence on renal dialysis: Secondary | ICD-10-CM | POA: Diagnosis not present

## 2017-11-02 DIAGNOSIS — D631 Anemia in chronic kidney disease: Secondary | ICD-10-CM | POA: Diagnosis not present

## 2017-11-02 DIAGNOSIS — N2581 Secondary hyperparathyroidism of renal origin: Secondary | ICD-10-CM | POA: Diagnosis not present

## 2017-11-02 DIAGNOSIS — I120 Hypertensive chronic kidney disease with stage 5 chronic kidney disease or end stage renal disease: Secondary | ICD-10-CM | POA: Diagnosis not present

## 2017-11-04 DIAGNOSIS — D631 Anemia in chronic kidney disease: Secondary | ICD-10-CM | POA: Diagnosis not present

## 2017-11-04 DIAGNOSIS — N2581 Secondary hyperparathyroidism of renal origin: Secondary | ICD-10-CM | POA: Diagnosis not present

## 2017-11-04 DIAGNOSIS — N186 End stage renal disease: Secondary | ICD-10-CM | POA: Diagnosis not present

## 2017-11-06 DIAGNOSIS — D631 Anemia in chronic kidney disease: Secondary | ICD-10-CM | POA: Diagnosis not present

## 2017-11-06 DIAGNOSIS — N2581 Secondary hyperparathyroidism of renal origin: Secondary | ICD-10-CM | POA: Diagnosis not present

## 2017-11-06 DIAGNOSIS — N186 End stage renal disease: Secondary | ICD-10-CM | POA: Diagnosis not present

## 2017-11-09 DIAGNOSIS — N2581 Secondary hyperparathyroidism of renal origin: Secondary | ICD-10-CM | POA: Diagnosis not present

## 2017-11-09 DIAGNOSIS — N186 End stage renal disease: Secondary | ICD-10-CM | POA: Diagnosis not present

## 2017-11-09 DIAGNOSIS — D631 Anemia in chronic kidney disease: Secondary | ICD-10-CM | POA: Diagnosis not present

## 2017-11-11 DIAGNOSIS — D631 Anemia in chronic kidney disease: Secondary | ICD-10-CM | POA: Diagnosis not present

## 2017-11-11 DIAGNOSIS — N2581 Secondary hyperparathyroidism of renal origin: Secondary | ICD-10-CM | POA: Diagnosis not present

## 2017-11-11 DIAGNOSIS — N186 End stage renal disease: Secondary | ICD-10-CM | POA: Diagnosis not present

## 2017-11-13 DIAGNOSIS — N186 End stage renal disease: Secondary | ICD-10-CM | POA: Diagnosis not present

## 2017-11-13 DIAGNOSIS — D631 Anemia in chronic kidney disease: Secondary | ICD-10-CM | POA: Diagnosis not present

## 2017-11-13 DIAGNOSIS — N2581 Secondary hyperparathyroidism of renal origin: Secondary | ICD-10-CM | POA: Diagnosis not present

## 2017-11-16 DIAGNOSIS — D631 Anemia in chronic kidney disease: Secondary | ICD-10-CM | POA: Diagnosis not present

## 2017-11-16 DIAGNOSIS — N186 End stage renal disease: Secondary | ICD-10-CM | POA: Diagnosis not present

## 2017-11-16 DIAGNOSIS — N2581 Secondary hyperparathyroidism of renal origin: Secondary | ICD-10-CM | POA: Diagnosis not present

## 2017-11-18 DIAGNOSIS — D631 Anemia in chronic kidney disease: Secondary | ICD-10-CM | POA: Diagnosis not present

## 2017-11-18 DIAGNOSIS — N186 End stage renal disease: Secondary | ICD-10-CM | POA: Diagnosis not present

## 2017-11-18 DIAGNOSIS — N2581 Secondary hyperparathyroidism of renal origin: Secondary | ICD-10-CM | POA: Diagnosis not present

## 2017-11-19 ENCOUNTER — Ambulatory Visit (HOSPITAL_COMMUNITY)
Admission: RE | Admit: 2017-11-19 | Discharge: 2017-11-19 | Disposition: A | Discharge status: Home / Self Care | Payer: Medicare Other | Source: Ambulatory Visit | Attending: Vascular Surgery | Admitting: Vascular Surgery

## 2017-11-19 ENCOUNTER — Ambulatory Visit (INDEPENDENT_AMBULATORY_CARE_PROVIDER_SITE_OTHER): Payer: Medicare Other | Admitting: Vascular Surgery

## 2017-11-19 ENCOUNTER — Encounter: Payer: Self-pay | Admitting: Vascular Surgery

## 2017-11-19 VITALS — BP 92/59 | HR 82 | Temp 97.9°F | Resp 18 | Ht 73.0 in | Wt 182.6 lb

## 2017-11-19 DIAGNOSIS — N186 End stage renal disease: Secondary | ICD-10-CM

## 2017-11-19 DIAGNOSIS — Z992 Dependence on renal dialysis: Secondary | ICD-10-CM

## 2017-11-19 DIAGNOSIS — T82510A Breakdown (mechanical) of surgically created arteriovenous fistula, initial encounter: Secondary | ICD-10-CM

## 2017-11-19 NOTE — Progress Notes (Signed)
Patient is a 67 year old male referred for evaluation of degenerative aneurysm right arm AV fistula.  The patient has had 2 prior plications of the right arm AV fistula.  He was seen for similar findings in August 2018.  We deferred repair of the aneurysm at that point since the skin was not compromised.  He has not had any bleeding episodes.  He does not have any thinned out skin or ulceration currently.  Of note we are also following him for previous repair of iliac aneurysms with a Gore Excluder stent graft.  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  He is scheduled for follow-up regarding this in November 2019.  Review of systems: He denies shortness of breath.  He denies chest pain.  Physical exam:  Vitals:   11/19/17 1459  BP: (!) 92/59  Pulse: 82  Resp: 18  Temp: 97.9 F (36.6 C)  TempSrc: Oral  SpO2: 96%  Weight: 182 lb 9.6 oz (82.8 kg)  Height: 6\' 1"  (1.854 m)    Extremities: Right upper extremity aneurysmal degeneration diffusely throughout the right upper arm AV fistula.  Palpable thrill audible bruit in fistula.  Skin is thick around the fistula.  There is no adhesion of the skin to the aneurysm.  There is no thinned out shiny skin or ulceration.  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  Currently I do not believe this is high risk for bleeding.  The patient was instructed today that if the aneurysm continues to grow and the skin becomes thinned out we would consider most likely revision with a new AV access with either replacement with PTFE or Artegraft at that point.  Patient is wife are instructed today on how to handle any bleeding that might occur.  Plan: The patient will follow-up with me in November 2019 regarding his aneurysm.  At that time we will review his fistula again as well.  He was also informed today that if the skin becomes more thinned out or he has any bleeding episodes he will come  back sooner and we will consider revising the graft.  [name], MD [facility] [place] Office: [phone] Pager: [phone]

## 2017-11-20 DIAGNOSIS — N186 End stage renal disease: Secondary | ICD-10-CM | POA: Diagnosis not present

## 2017-11-20 DIAGNOSIS — D631 Anemia in chronic kidney disease: Secondary | ICD-10-CM | POA: Diagnosis not present

## 2017-11-20 DIAGNOSIS — N2581 Secondary hyperparathyroidism of renal origin: Secondary | ICD-10-CM | POA: Diagnosis not present

## 2017-11-23 DIAGNOSIS — D631 Anemia in chronic kidney disease: Secondary | ICD-10-CM | POA: Diagnosis not present

## 2017-11-23 DIAGNOSIS — N2581 Secondary hyperparathyroidism of renal origin: Secondary | ICD-10-CM | POA: Diagnosis not present

## 2017-11-23 DIAGNOSIS — N186 End stage renal disease: Secondary | ICD-10-CM | POA: Diagnosis not present

## 2017-11-25 DIAGNOSIS — D631 Anemia in chronic kidney disease: Secondary | ICD-10-CM | POA: Diagnosis not present

## 2017-11-25 DIAGNOSIS — N2581 Secondary hyperparathyroidism of renal origin: Secondary | ICD-10-CM | POA: Diagnosis not present

## 2017-11-25 DIAGNOSIS — N186 End stage renal disease: Secondary | ICD-10-CM | POA: Diagnosis not present

## 2017-11-27 DIAGNOSIS — N2581 Secondary hyperparathyroidism of renal origin: Secondary | ICD-10-CM | POA: Diagnosis not present

## 2017-11-27 DIAGNOSIS — N186 End stage renal disease: Secondary | ICD-10-CM | POA: Diagnosis not present

## 2017-11-27 DIAGNOSIS — D631 Anemia in chronic kidney disease: Secondary | ICD-10-CM | POA: Diagnosis not present

## 2017-11-30 DIAGNOSIS — N2581 Secondary hyperparathyroidism of renal origin: Secondary | ICD-10-CM | POA: Diagnosis not present

## 2017-11-30 DIAGNOSIS — N186 End stage renal disease: Secondary | ICD-10-CM | POA: Diagnosis not present

## 2017-11-30 DIAGNOSIS — D631 Anemia in chronic kidney disease: Secondary | ICD-10-CM | POA: Diagnosis not present

## 2017-12-02 DIAGNOSIS — N186 End stage renal disease: Secondary | ICD-10-CM | POA: Diagnosis not present

## 2017-12-02 DIAGNOSIS — D631 Anemia in chronic kidney disease: Secondary | ICD-10-CM | POA: Diagnosis not present

## 2017-12-02 DIAGNOSIS — N2581 Secondary hyperparathyroidism of renal origin: Secondary | ICD-10-CM | POA: Diagnosis not present

## 2017-12-03 DIAGNOSIS — Z992 Dependence on renal dialysis: Secondary | ICD-10-CM | POA: Diagnosis not present

## 2017-12-03 DIAGNOSIS — I120 Hypertensive chronic kidney disease with stage 5 chronic kidney disease or end stage renal disease: Secondary | ICD-10-CM | POA: Diagnosis not present

## 2017-12-03 DIAGNOSIS — N186 End stage renal disease: Secondary | ICD-10-CM | POA: Diagnosis not present

## 2017-12-04 DIAGNOSIS — D631 Anemia in chronic kidney disease: Secondary | ICD-10-CM | POA: Diagnosis not present

## 2017-12-04 DIAGNOSIS — N186 End stage renal disease: Secondary | ICD-10-CM | POA: Diagnosis not present

## 2017-12-04 DIAGNOSIS — N2581 Secondary hyperparathyroidism of renal origin: Secondary | ICD-10-CM | POA: Diagnosis not present

## 2017-12-07 DIAGNOSIS — N186 End stage renal disease: Secondary | ICD-10-CM | POA: Diagnosis not present

## 2017-12-07 DIAGNOSIS — N2581 Secondary hyperparathyroidism of renal origin: Secondary | ICD-10-CM | POA: Diagnosis not present

## 2017-12-07 DIAGNOSIS — D631 Anemia in chronic kidney disease: Secondary | ICD-10-CM | POA: Diagnosis not present

## 2017-12-09 DIAGNOSIS — N186 End stage renal disease: Secondary | ICD-10-CM | POA: Diagnosis not present

## 2017-12-09 DIAGNOSIS — D631 Anemia in chronic kidney disease: Secondary | ICD-10-CM | POA: Diagnosis not present

## 2017-12-09 DIAGNOSIS — N2581 Secondary hyperparathyroidism of renal origin: Secondary | ICD-10-CM | POA: Diagnosis not present

## 2017-12-11 DIAGNOSIS — N2581 Secondary hyperparathyroidism of renal origin: Secondary | ICD-10-CM | POA: Diagnosis not present

## 2017-12-11 DIAGNOSIS — D631 Anemia in chronic kidney disease: Secondary | ICD-10-CM | POA: Diagnosis not present

## 2017-12-11 DIAGNOSIS — N186 End stage renal disease: Secondary | ICD-10-CM | POA: Diagnosis not present

## 2017-12-14 DIAGNOSIS — N2581 Secondary hyperparathyroidism of renal origin: Secondary | ICD-10-CM | POA: Diagnosis not present

## 2017-12-14 DIAGNOSIS — N186 End stage renal disease: Secondary | ICD-10-CM | POA: Diagnosis not present

## 2017-12-14 DIAGNOSIS — D631 Anemia in chronic kidney disease: Secondary | ICD-10-CM | POA: Diagnosis not present

## 2017-12-16 DIAGNOSIS — N2581 Secondary hyperparathyroidism of renal origin: Secondary | ICD-10-CM | POA: Diagnosis not present

## 2017-12-16 DIAGNOSIS — N186 End stage renal disease: Secondary | ICD-10-CM | POA: Diagnosis not present

## 2017-12-16 DIAGNOSIS — D631 Anemia in chronic kidney disease: Secondary | ICD-10-CM | POA: Diagnosis not present

## 2017-12-18 DIAGNOSIS — D631 Anemia in chronic kidney disease: Secondary | ICD-10-CM | POA: Diagnosis not present

## 2017-12-18 DIAGNOSIS — N2581 Secondary hyperparathyroidism of renal origin: Secondary | ICD-10-CM | POA: Diagnosis not present

## 2017-12-18 DIAGNOSIS — N186 End stage renal disease: Secondary | ICD-10-CM | POA: Diagnosis not present

## 2017-12-21 DIAGNOSIS — D631 Anemia in chronic kidney disease: Secondary | ICD-10-CM | POA: Diagnosis not present

## 2017-12-21 DIAGNOSIS — N186 End stage renal disease: Secondary | ICD-10-CM | POA: Diagnosis not present

## 2017-12-21 DIAGNOSIS — N2581 Secondary hyperparathyroidism of renal origin: Secondary | ICD-10-CM | POA: Diagnosis not present

## 2017-12-22 DIAGNOSIS — M545 Low back pain: Secondary | ICD-10-CM | POA: Diagnosis not present

## 2017-12-22 DIAGNOSIS — Z79899 Other long term (current) drug therapy: Secondary | ICD-10-CM | POA: Diagnosis not present

## 2017-12-22 DIAGNOSIS — G894 Chronic pain syndrome: Secondary | ICD-10-CM | POA: Diagnosis not present

## 2017-12-22 DIAGNOSIS — M5136 Other intervertebral disc degeneration, lumbar region: Secondary | ICD-10-CM | POA: Diagnosis not present

## 2017-12-23 DIAGNOSIS — N2581 Secondary hyperparathyroidism of renal origin: Secondary | ICD-10-CM | POA: Diagnosis not present

## 2017-12-23 DIAGNOSIS — D631 Anemia in chronic kidney disease: Secondary | ICD-10-CM | POA: Diagnosis not present

## 2017-12-23 DIAGNOSIS — N186 End stage renal disease: Secondary | ICD-10-CM | POA: Diagnosis not present

## 2017-12-24 DIAGNOSIS — H401132 Primary open-angle glaucoma, bilateral, moderate stage: Secondary | ICD-10-CM | POA: Diagnosis not present

## 2017-12-24 DIAGNOSIS — H401111 Primary open-angle glaucoma, right eye, mild stage: Secondary | ICD-10-CM | POA: Diagnosis not present

## 2017-12-25 DIAGNOSIS — N2581 Secondary hyperparathyroidism of renal origin: Secondary | ICD-10-CM | POA: Diagnosis not present

## 2017-12-25 DIAGNOSIS — D631 Anemia in chronic kidney disease: Secondary | ICD-10-CM | POA: Diagnosis not present

## 2017-12-25 DIAGNOSIS — N186 End stage renal disease: Secondary | ICD-10-CM | POA: Diagnosis not present

## 2017-12-28 DIAGNOSIS — N2581 Secondary hyperparathyroidism of renal origin: Secondary | ICD-10-CM | POA: Diagnosis not present

## 2017-12-28 DIAGNOSIS — D631 Anemia in chronic kidney disease: Secondary | ICD-10-CM | POA: Diagnosis not present

## 2017-12-28 DIAGNOSIS — N186 End stage renal disease: Secondary | ICD-10-CM | POA: Diagnosis not present

## 2017-12-30 DIAGNOSIS — D631 Anemia in chronic kidney disease: Secondary | ICD-10-CM | POA: Diagnosis not present

## 2017-12-30 DIAGNOSIS — N186 End stage renal disease: Secondary | ICD-10-CM | POA: Diagnosis not present

## 2017-12-30 DIAGNOSIS — N2581 Secondary hyperparathyroidism of renal origin: Secondary | ICD-10-CM | POA: Diagnosis not present

## 2018-01-01 DIAGNOSIS — N2581 Secondary hyperparathyroidism of renal origin: Secondary | ICD-10-CM | POA: Diagnosis not present

## 2018-01-01 DIAGNOSIS — D631 Anemia in chronic kidney disease: Secondary | ICD-10-CM | POA: Diagnosis not present

## 2018-01-01 DIAGNOSIS — N186 End stage renal disease: Secondary | ICD-10-CM | POA: Diagnosis not present

## 2018-01-03 DIAGNOSIS — I120 Hypertensive chronic kidney disease with stage 5 chronic kidney disease or end stage renal disease: Secondary | ICD-10-CM | POA: Diagnosis not present

## 2018-01-03 DIAGNOSIS — Z992 Dependence on renal dialysis: Secondary | ICD-10-CM | POA: Diagnosis not present

## 2018-01-03 DIAGNOSIS — N186 End stage renal disease: Secondary | ICD-10-CM | POA: Diagnosis not present

## 2018-01-04 DIAGNOSIS — N186 End stage renal disease: Secondary | ICD-10-CM | POA: Diagnosis not present

## 2018-01-04 DIAGNOSIS — N2581 Secondary hyperparathyroidism of renal origin: Secondary | ICD-10-CM | POA: Diagnosis not present

## 2018-01-04 DIAGNOSIS — D631 Anemia in chronic kidney disease: Secondary | ICD-10-CM | POA: Diagnosis not present

## 2018-01-04 DIAGNOSIS — Z23 Encounter for immunization: Secondary | ICD-10-CM | POA: Diagnosis not present

## 2018-01-06 DIAGNOSIS — N2581 Secondary hyperparathyroidism of renal origin: Secondary | ICD-10-CM | POA: Diagnosis not present

## 2018-01-06 DIAGNOSIS — N186 End stage renal disease: Secondary | ICD-10-CM | POA: Diagnosis not present

## 2018-01-06 DIAGNOSIS — Z23 Encounter for immunization: Secondary | ICD-10-CM | POA: Diagnosis not present

## 2018-01-06 DIAGNOSIS — D631 Anemia in chronic kidney disease: Secondary | ICD-10-CM | POA: Diagnosis not present

## 2018-01-08 DIAGNOSIS — Z23 Encounter for immunization: Secondary | ICD-10-CM | POA: Diagnosis not present

## 2018-01-08 DIAGNOSIS — N186 End stage renal disease: Secondary | ICD-10-CM | POA: Diagnosis not present

## 2018-01-08 DIAGNOSIS — D631 Anemia in chronic kidney disease: Secondary | ICD-10-CM | POA: Diagnosis not present

## 2018-01-08 DIAGNOSIS — N2581 Secondary hyperparathyroidism of renal origin: Secondary | ICD-10-CM | POA: Diagnosis not present

## 2018-01-11 DIAGNOSIS — Z23 Encounter for immunization: Secondary | ICD-10-CM | POA: Diagnosis not present

## 2018-01-11 DIAGNOSIS — N186 End stage renal disease: Secondary | ICD-10-CM | POA: Diagnosis not present

## 2018-01-11 DIAGNOSIS — D631 Anemia in chronic kidney disease: Secondary | ICD-10-CM | POA: Diagnosis not present

## 2018-01-11 DIAGNOSIS — N2581 Secondary hyperparathyroidism of renal origin: Secondary | ICD-10-CM | POA: Diagnosis not present

## 2018-01-13 DIAGNOSIS — D631 Anemia in chronic kidney disease: Secondary | ICD-10-CM | POA: Diagnosis not present

## 2018-01-13 DIAGNOSIS — N2581 Secondary hyperparathyroidism of renal origin: Secondary | ICD-10-CM | POA: Diagnosis not present

## 2018-01-13 DIAGNOSIS — Z23 Encounter for immunization: Secondary | ICD-10-CM | POA: Diagnosis not present

## 2018-01-13 DIAGNOSIS — N186 End stage renal disease: Secondary | ICD-10-CM | POA: Diagnosis not present

## 2018-01-15 DIAGNOSIS — N2581 Secondary hyperparathyroidism of renal origin: Secondary | ICD-10-CM | POA: Diagnosis not present

## 2018-01-15 DIAGNOSIS — Z23 Encounter for immunization: Secondary | ICD-10-CM | POA: Diagnosis not present

## 2018-01-15 DIAGNOSIS — N186 End stage renal disease: Secondary | ICD-10-CM | POA: Diagnosis not present

## 2018-01-15 DIAGNOSIS — D631 Anemia in chronic kidney disease: Secondary | ICD-10-CM | POA: Diagnosis not present

## 2018-01-18 DIAGNOSIS — N2581 Secondary hyperparathyroidism of renal origin: Secondary | ICD-10-CM | POA: Diagnosis not present

## 2018-01-18 DIAGNOSIS — Z23 Encounter for immunization: Secondary | ICD-10-CM | POA: Diagnosis not present

## 2018-01-18 DIAGNOSIS — D631 Anemia in chronic kidney disease: Secondary | ICD-10-CM | POA: Diagnosis not present

## 2018-01-18 DIAGNOSIS — N186 End stage renal disease: Secondary | ICD-10-CM | POA: Diagnosis not present

## 2018-01-20 DIAGNOSIS — D631 Anemia in chronic kidney disease: Secondary | ICD-10-CM | POA: Diagnosis not present

## 2018-01-20 DIAGNOSIS — Z23 Encounter for immunization: Secondary | ICD-10-CM | POA: Diagnosis not present

## 2018-01-20 DIAGNOSIS — N2581 Secondary hyperparathyroidism of renal origin: Secondary | ICD-10-CM | POA: Diagnosis not present

## 2018-01-20 DIAGNOSIS — N186 End stage renal disease: Secondary | ICD-10-CM | POA: Diagnosis not present

## 2018-01-22 DIAGNOSIS — N186 End stage renal disease: Secondary | ICD-10-CM | POA: Diagnosis not present

## 2018-01-22 DIAGNOSIS — Z23 Encounter for immunization: Secondary | ICD-10-CM | POA: Diagnosis not present

## 2018-01-22 DIAGNOSIS — D631 Anemia in chronic kidney disease: Secondary | ICD-10-CM | POA: Diagnosis not present

## 2018-01-22 DIAGNOSIS — N2581 Secondary hyperparathyroidism of renal origin: Secondary | ICD-10-CM | POA: Diagnosis not present

## 2018-01-25 DIAGNOSIS — D631 Anemia in chronic kidney disease: Secondary | ICD-10-CM | POA: Diagnosis not present

## 2018-01-25 DIAGNOSIS — N186 End stage renal disease: Secondary | ICD-10-CM | POA: Diagnosis not present

## 2018-01-25 DIAGNOSIS — N2581 Secondary hyperparathyroidism of renal origin: Secondary | ICD-10-CM | POA: Diagnosis not present

## 2018-01-25 DIAGNOSIS — Z23 Encounter for immunization: Secondary | ICD-10-CM | POA: Diagnosis not present

## 2018-01-29 DIAGNOSIS — N186 End stage renal disease: Secondary | ICD-10-CM | POA: Diagnosis not present

## 2018-01-29 DIAGNOSIS — N2581 Secondary hyperparathyroidism of renal origin: Secondary | ICD-10-CM | POA: Diagnosis not present

## 2018-01-29 DIAGNOSIS — D631 Anemia in chronic kidney disease: Secondary | ICD-10-CM | POA: Diagnosis not present

## 2018-01-29 DIAGNOSIS — Z23 Encounter for immunization: Secondary | ICD-10-CM | POA: Diagnosis not present

## 2018-02-01 DIAGNOSIS — N2581 Secondary hyperparathyroidism of renal origin: Secondary | ICD-10-CM | POA: Diagnosis not present

## 2018-02-01 DIAGNOSIS — Z23 Encounter for immunization: Secondary | ICD-10-CM | POA: Diagnosis not present

## 2018-02-01 DIAGNOSIS — D631 Anemia in chronic kidney disease: Secondary | ICD-10-CM | POA: Diagnosis not present

## 2018-02-01 DIAGNOSIS — N186 End stage renal disease: Secondary | ICD-10-CM | POA: Diagnosis not present

## 2018-02-02 DIAGNOSIS — N186 End stage renal disease: Secondary | ICD-10-CM | POA: Diagnosis not present

## 2018-02-02 DIAGNOSIS — I120 Hypertensive chronic kidney disease with stage 5 chronic kidney disease or end stage renal disease: Secondary | ICD-10-CM | POA: Diagnosis not present

## 2018-02-02 DIAGNOSIS — Z992 Dependence on renal dialysis: Secondary | ICD-10-CM | POA: Diagnosis not present

## 2018-02-03 DIAGNOSIS — D631 Anemia in chronic kidney disease: Secondary | ICD-10-CM | POA: Diagnosis not present

## 2018-02-03 DIAGNOSIS — N2581 Secondary hyperparathyroidism of renal origin: Secondary | ICD-10-CM | POA: Diagnosis not present

## 2018-02-03 DIAGNOSIS — Z992 Dependence on renal dialysis: Secondary | ICD-10-CM | POA: Diagnosis not present

## 2018-02-03 DIAGNOSIS — N186 End stage renal disease: Secondary | ICD-10-CM | POA: Diagnosis not present

## 2018-02-05 DIAGNOSIS — Z992 Dependence on renal dialysis: Secondary | ICD-10-CM | POA: Diagnosis not present

## 2018-02-05 DIAGNOSIS — D631 Anemia in chronic kidney disease: Secondary | ICD-10-CM | POA: Diagnosis not present

## 2018-02-05 DIAGNOSIS — N186 End stage renal disease: Secondary | ICD-10-CM | POA: Diagnosis not present

## 2018-02-05 DIAGNOSIS — N2581 Secondary hyperparathyroidism of renal origin: Secondary | ICD-10-CM | POA: Diagnosis not present

## 2018-02-08 DIAGNOSIS — N2581 Secondary hyperparathyroidism of renal origin: Secondary | ICD-10-CM | POA: Diagnosis not present

## 2018-02-08 DIAGNOSIS — Z992 Dependence on renal dialysis: Secondary | ICD-10-CM | POA: Diagnosis not present

## 2018-02-08 DIAGNOSIS — D631 Anemia in chronic kidney disease: Secondary | ICD-10-CM | POA: Diagnosis not present

## 2018-02-08 DIAGNOSIS — N186 End stage renal disease: Secondary | ICD-10-CM | POA: Diagnosis not present

## 2018-02-10 DIAGNOSIS — N2581 Secondary hyperparathyroidism of renal origin: Secondary | ICD-10-CM | POA: Diagnosis not present

## 2018-02-10 DIAGNOSIS — Z992 Dependence on renal dialysis: Secondary | ICD-10-CM | POA: Diagnosis not present

## 2018-02-10 DIAGNOSIS — D631 Anemia in chronic kidney disease: Secondary | ICD-10-CM | POA: Diagnosis not present

## 2018-02-10 DIAGNOSIS — N186 End stage renal disease: Secondary | ICD-10-CM | POA: Diagnosis not present

## 2018-02-12 DIAGNOSIS — N2581 Secondary hyperparathyroidism of renal origin: Secondary | ICD-10-CM | POA: Diagnosis not present

## 2018-02-12 DIAGNOSIS — Z992 Dependence on renal dialysis: Secondary | ICD-10-CM | POA: Diagnosis not present

## 2018-02-12 DIAGNOSIS — N186 End stage renal disease: Secondary | ICD-10-CM | POA: Diagnosis not present

## 2018-02-12 DIAGNOSIS — D631 Anemia in chronic kidney disease: Secondary | ICD-10-CM | POA: Diagnosis not present

## 2018-02-15 DIAGNOSIS — D631 Anemia in chronic kidney disease: Secondary | ICD-10-CM | POA: Diagnosis not present

## 2018-02-15 DIAGNOSIS — Z992 Dependence on renal dialysis: Secondary | ICD-10-CM | POA: Diagnosis not present

## 2018-02-15 DIAGNOSIS — N186 End stage renal disease: Secondary | ICD-10-CM | POA: Diagnosis not present

## 2018-02-15 DIAGNOSIS — N2581 Secondary hyperparathyroidism of renal origin: Secondary | ICD-10-CM | POA: Diagnosis not present

## 2018-02-17 DIAGNOSIS — Z992 Dependence on renal dialysis: Secondary | ICD-10-CM | POA: Diagnosis not present

## 2018-02-17 DIAGNOSIS — D631 Anemia in chronic kidney disease: Secondary | ICD-10-CM | POA: Diagnosis not present

## 2018-02-17 DIAGNOSIS — N186 End stage renal disease: Secondary | ICD-10-CM | POA: Diagnosis not present

## 2018-02-17 DIAGNOSIS — N2581 Secondary hyperparathyroidism of renal origin: Secondary | ICD-10-CM | POA: Diagnosis not present

## 2018-02-19 DIAGNOSIS — N186 End stage renal disease: Secondary | ICD-10-CM | POA: Diagnosis not present

## 2018-02-19 DIAGNOSIS — Z992 Dependence on renal dialysis: Secondary | ICD-10-CM | POA: Diagnosis not present

## 2018-02-19 DIAGNOSIS — D631 Anemia in chronic kidney disease: Secondary | ICD-10-CM | POA: Diagnosis not present

## 2018-02-19 DIAGNOSIS — N2581 Secondary hyperparathyroidism of renal origin: Secondary | ICD-10-CM | POA: Diagnosis not present

## 2018-02-22 DIAGNOSIS — N186 End stage renal disease: Secondary | ICD-10-CM | POA: Diagnosis not present

## 2018-02-22 DIAGNOSIS — N2581 Secondary hyperparathyroidism of renal origin: Secondary | ICD-10-CM | POA: Diagnosis not present

## 2018-02-22 DIAGNOSIS — D631 Anemia in chronic kidney disease: Secondary | ICD-10-CM | POA: Diagnosis not present

## 2018-02-22 DIAGNOSIS — Z992 Dependence on renal dialysis: Secondary | ICD-10-CM | POA: Diagnosis not present

## 2018-02-24 DIAGNOSIS — N186 End stage renal disease: Secondary | ICD-10-CM | POA: Diagnosis not present

## 2018-02-24 DIAGNOSIS — D631 Anemia in chronic kidney disease: Secondary | ICD-10-CM | POA: Diagnosis not present

## 2018-02-24 DIAGNOSIS — N2581 Secondary hyperparathyroidism of renal origin: Secondary | ICD-10-CM | POA: Diagnosis not present

## 2018-02-24 DIAGNOSIS — Z992 Dependence on renal dialysis: Secondary | ICD-10-CM | POA: Diagnosis not present

## 2018-02-26 DIAGNOSIS — D631 Anemia in chronic kidney disease: Secondary | ICD-10-CM | POA: Diagnosis not present

## 2018-02-26 DIAGNOSIS — N186 End stage renal disease: Secondary | ICD-10-CM | POA: Diagnosis not present

## 2018-02-26 DIAGNOSIS — N2581 Secondary hyperparathyroidism of renal origin: Secondary | ICD-10-CM | POA: Diagnosis not present

## 2018-02-26 DIAGNOSIS — Z992 Dependence on renal dialysis: Secondary | ICD-10-CM | POA: Diagnosis not present

## 2018-03-01 DIAGNOSIS — N186 End stage renal disease: Secondary | ICD-10-CM | POA: Diagnosis not present

## 2018-03-01 DIAGNOSIS — D631 Anemia in chronic kidney disease: Secondary | ICD-10-CM | POA: Diagnosis not present

## 2018-03-01 DIAGNOSIS — N2581 Secondary hyperparathyroidism of renal origin: Secondary | ICD-10-CM | POA: Diagnosis not present

## 2018-03-01 DIAGNOSIS — Z992 Dependence on renal dialysis: Secondary | ICD-10-CM | POA: Diagnosis not present

## 2018-03-03 DIAGNOSIS — Z992 Dependence on renal dialysis: Secondary | ICD-10-CM | POA: Diagnosis not present

## 2018-03-03 DIAGNOSIS — D631 Anemia in chronic kidney disease: Secondary | ICD-10-CM | POA: Diagnosis not present

## 2018-03-03 DIAGNOSIS — N186 End stage renal disease: Secondary | ICD-10-CM | POA: Diagnosis not present

## 2018-03-03 DIAGNOSIS — N2581 Secondary hyperparathyroidism of renal origin: Secondary | ICD-10-CM | POA: Diagnosis not present

## 2018-03-05 DIAGNOSIS — N186 End stage renal disease: Secondary | ICD-10-CM | POA: Diagnosis not present

## 2018-03-05 DIAGNOSIS — D631 Anemia in chronic kidney disease: Secondary | ICD-10-CM | POA: Diagnosis not present

## 2018-03-05 DIAGNOSIS — Z992 Dependence on renal dialysis: Secondary | ICD-10-CM | POA: Diagnosis not present

## 2018-03-05 DIAGNOSIS — I120 Hypertensive chronic kidney disease with stage 5 chronic kidney disease or end stage renal disease: Secondary | ICD-10-CM | POA: Diagnosis not present

## 2018-03-05 DIAGNOSIS — N2581 Secondary hyperparathyroidism of renal origin: Secondary | ICD-10-CM | POA: Diagnosis not present

## 2018-03-08 DIAGNOSIS — D631 Anemia in chronic kidney disease: Secondary | ICD-10-CM | POA: Diagnosis not present

## 2018-03-08 DIAGNOSIS — N186 End stage renal disease: Secondary | ICD-10-CM | POA: Diagnosis not present

## 2018-03-08 DIAGNOSIS — N2581 Secondary hyperparathyroidism of renal origin: Secondary | ICD-10-CM | POA: Diagnosis not present

## 2018-03-09 ENCOUNTER — Ambulatory Visit
Admission: RE | Admit: 2018-03-09 | Discharge: 2018-03-09 | Disposition: A | Discharge status: Home / Self Care | Payer: Medicare Other | Source: Ambulatory Visit | Attending: Vascular Surgery | Admitting: Vascular Surgery

## 2018-03-09 DIAGNOSIS — I723 Aneurysm of iliac artery: Secondary | ICD-10-CM

## 2018-03-09 MED ORDER — IOPAMIDOL (ISOVUE-370) INJECTION 76%
75.0000 mL | Freq: Once | INTRAVENOUS | Status: AC | PRN
  Administered 2018-03-09: 75 mL via INTRAVENOUS

## 2018-03-10 DIAGNOSIS — N186 End stage renal disease: Secondary | ICD-10-CM | POA: Diagnosis not present

## 2018-03-10 DIAGNOSIS — N2581 Secondary hyperparathyroidism of renal origin: Secondary | ICD-10-CM | POA: Diagnosis not present

## 2018-03-10 DIAGNOSIS — D631 Anemia in chronic kidney disease: Secondary | ICD-10-CM | POA: Diagnosis not present

## 2018-03-11 ENCOUNTER — Encounter: Payer: Self-pay | Admitting: Vascular Surgery

## 2018-03-11 ENCOUNTER — Ambulatory Visit (INDEPENDENT_AMBULATORY_CARE_PROVIDER_SITE_OTHER): Payer: Medicare Other | Admitting: Vascular Surgery

## 2018-03-11 ENCOUNTER — Ambulatory Visit: Payer: Medicare Other | Admitting: Vascular Surgery

## 2018-03-11 ENCOUNTER — Other Ambulatory Visit: Payer: Self-pay

## 2018-03-11 VITALS — BP 110/73 | HR 72 | Temp 97.0°F | Resp 18 | Ht 73.5 in | Wt 184.1 lb

## 2018-03-11 DIAGNOSIS — I723 Aneurysm of iliac artery: Secondary | ICD-10-CM | POA: Diagnosis not present

## 2018-03-11 DIAGNOSIS — I728 Aneurysm of other specified arteries: Secondary | ICD-10-CM

## 2018-03-11 NOTE — Progress Notes (Signed)
Patient is a 67 year old male who returns for follow-up today.  He is previously undergone aortobiiliac stent grafting with a iliac branch device to the right side and coil embolization of the left internal iliac artery.  He also has known celiac and superior mesenteric artery aneurysms.  He reports he has been doing well overall.  He does have end-stage renal disease and is on hemodialysis.  He states he has fatigue from this but otherwise feels well.  He denies any abdominal or back pain.   Chronic medical problems remain hypertension CHF end-stage renal disease arthritis all of which are currently stable  Past Medical History:  Diagnosis Date  . Anemia in chronic kidney disease   . Arthritis    "hands; basically all my joints" (11/08/2014)  . CHF (congestive heart failure) ([place])   . Chronic back pain    "mostly lower" (11/08/2014)  . End stage renal disease ([place])    pt does not urinate; pt. states that he does dialysis on MWF; [place]" (11/08/2014)  . GERD (gastroesophageal reflux disease)    "sometimes" (11/08/2014)  . Gout    "hands" (11/08/2014)  . Heart failure with reduced ejection fraction ([place])   . Hypertension      Review of systems: He denies shortness of breath.  He denies chest pain.  Current Outpatient Medications on File Prior to Visit  Medication Sig Dispense Refill  . aspirin EC 81 MG tablet Take 1 tablet (81 mg total) by mouth daily. 90 tablet 3  . atorvastatin (LIPITOR) 40 MG tablet Take 40 mg by mouth daily.    . B Complex-C-Folic Acid (DIALYVITE 564 PO) Take 1 tablet by mouth every Monday, Wednesday, and Friday with hemodialysis. After dialysis.    Marland Kitchen calcium carbonate (OS-CAL) 1250 (500 Ca) MG chewable tablet Chew 2 tablets by mouth at bedtime.    . cinacalcet (SENSIPAR) 60 MG tablet Take 60 mg by mouth daily after supper.    . colchicine 0.6 MG tablet Take 0.6 mg by mouth daily as needed (for gout flare ups.).   0  . morphine (MSIR) 15 MG tablet Take 15 mg by mouth  every 6 (six) hours as needed for severe pain (for pain.).    Marland Kitchen oxyCODONE (OXY IR/ROXICODONE) 5 MG immediate release tablet Take 1 tablet (5 mg total) by mouth every 6 (six) hours as needed for severe pain. 6 tablet 0  . oxyCODONE-acetaminophen (PERCOCET) 10-325 MG tablet Take 1 tablet by mouth 4 (four) times daily.     . predniSONE (DELTASONE) 5 MG tablet Take 5 mg by mouth daily.   0  . promethazine (PHENERGAN) 25 MG tablet Take 25 mg by mouth every 6 (six) hours as needed for nausea or vomiting.    . sucroferric oxyhydroxide (VELPHORO) 500 MG chewable tablet Chew 250 mg by mouth 3 (three) times daily with meals.     Marland Kitchen atorvastatin (LIPITOR) 40 MG tablet Take 1 tablet (40 mg total) by mouth daily. 90 tablet 3   No current facility-administered medications on file prior to visit.     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  Data: Patient had a CT scan of the abdomen and pelvis March 09, 2018.  This showed good exclusion of the iliac aneurysms in good position of the stent graft.  There was flow into the internal iliac artery on the right with coil embolization of the left internal iliac artery celiac artery was 18 mm diameter.  Superior mesenteric artery aneurysm was 16 mm diameter.  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  There is no thrombus in either of the vessels.  Left common iliac artery aneurysm 3.8 cm.  The common iliac artery aneurysm 2.9 cm.  Both of these were fairly well excluded although there was a possible subtle endoleak on the left side.  Assessment: Doing well status  post aneurysm stent graft repair primarily of iliac aneurysms with branch device on the right side coil embolization of the left internal iliac artery.  Known mesenteric aneurysms stable over the last several years.  Right arm AV fistula with aneurysmal degeneration but not significant enough to require repair at this point.  Patient will call me if he has bleeding episodes or ulceration or enlargement of the aneurysm was thinned out skin.  Plan: The patient will follow-up in 1 year with a ultrasound of his stent graft.  [name], MD [facility] [place] Office: [phone] Pager: [phone]

## 2018-03-12 DIAGNOSIS — N2581 Secondary hyperparathyroidism of renal origin: Secondary | ICD-10-CM | POA: Diagnosis not present

## 2018-03-12 DIAGNOSIS — N186 End stage renal disease: Secondary | ICD-10-CM | POA: Diagnosis not present

## 2018-03-12 DIAGNOSIS — D631 Anemia in chronic kidney disease: Secondary | ICD-10-CM | POA: Diagnosis not present

## 2018-03-15 DIAGNOSIS — N186 End stage renal disease: Secondary | ICD-10-CM | POA: Diagnosis not present

## 2018-03-15 DIAGNOSIS — N2581 Secondary hyperparathyroidism of renal origin: Secondary | ICD-10-CM | POA: Diagnosis not present

## 2018-03-15 DIAGNOSIS — D631 Anemia in chronic kidney disease: Secondary | ICD-10-CM | POA: Diagnosis not present

## 2018-03-17 DIAGNOSIS — N186 End stage renal disease: Secondary | ICD-10-CM | POA: Diagnosis not present

## 2018-03-17 DIAGNOSIS — N2581 Secondary hyperparathyroidism of renal origin: Secondary | ICD-10-CM | POA: Diagnosis not present

## 2018-03-17 DIAGNOSIS — D631 Anemia in chronic kidney disease: Secondary | ICD-10-CM | POA: Diagnosis not present

## 2018-03-19 DIAGNOSIS — D631 Anemia in chronic kidney disease: Secondary | ICD-10-CM | POA: Diagnosis not present

## 2018-03-19 DIAGNOSIS — N2581 Secondary hyperparathyroidism of renal origin: Secondary | ICD-10-CM | POA: Diagnosis not present

## 2018-03-19 DIAGNOSIS — N186 End stage renal disease: Secondary | ICD-10-CM | POA: Diagnosis not present

## 2018-03-22 DIAGNOSIS — D631 Anemia in chronic kidney disease: Secondary | ICD-10-CM | POA: Diagnosis not present

## 2018-03-22 DIAGNOSIS — N2581 Secondary hyperparathyroidism of renal origin: Secondary | ICD-10-CM | POA: Diagnosis not present

## 2018-03-22 DIAGNOSIS — N186 End stage renal disease: Secondary | ICD-10-CM | POA: Diagnosis not present

## 2018-03-24 DIAGNOSIS — D631 Anemia in chronic kidney disease: Secondary | ICD-10-CM | POA: Diagnosis not present

## 2018-03-24 DIAGNOSIS — N2581 Secondary hyperparathyroidism of renal origin: Secondary | ICD-10-CM | POA: Diagnosis not present

## 2018-03-24 DIAGNOSIS — N186 End stage renal disease: Secondary | ICD-10-CM | POA: Diagnosis not present

## 2018-03-26 DIAGNOSIS — D631 Anemia in chronic kidney disease: Secondary | ICD-10-CM | POA: Diagnosis not present

## 2018-03-26 DIAGNOSIS — N2581 Secondary hyperparathyroidism of renal origin: Secondary | ICD-10-CM | POA: Diagnosis not present

## 2018-03-26 DIAGNOSIS — N186 End stage renal disease: Secondary | ICD-10-CM | POA: Diagnosis not present

## 2018-03-28 DIAGNOSIS — D631 Anemia in chronic kidney disease: Secondary | ICD-10-CM | POA: Diagnosis not present

## 2018-03-28 DIAGNOSIS — N186 End stage renal disease: Secondary | ICD-10-CM | POA: Diagnosis not present

## 2018-03-28 DIAGNOSIS — N2581 Secondary hyperparathyroidism of renal origin: Secondary | ICD-10-CM | POA: Diagnosis not present

## 2018-03-30 DIAGNOSIS — N2581 Secondary hyperparathyroidism of renal origin: Secondary | ICD-10-CM | POA: Diagnosis not present

## 2018-03-30 DIAGNOSIS — D631 Anemia in chronic kidney disease: Secondary | ICD-10-CM | POA: Diagnosis not present

## 2018-03-30 DIAGNOSIS — N186 End stage renal disease: Secondary | ICD-10-CM | POA: Diagnosis not present

## 2018-04-02 DIAGNOSIS — N186 End stage renal disease: Secondary | ICD-10-CM | POA: Diagnosis not present

## 2018-04-02 DIAGNOSIS — D631 Anemia in chronic kidney disease: Secondary | ICD-10-CM | POA: Diagnosis not present

## 2018-04-02 DIAGNOSIS — N2581 Secondary hyperparathyroidism of renal origin: Secondary | ICD-10-CM | POA: Diagnosis not present

## 2018-04-04 DIAGNOSIS — Z992 Dependence on renal dialysis: Secondary | ICD-10-CM | POA: Diagnosis not present

## 2018-04-04 DIAGNOSIS — I120 Hypertensive chronic kidney disease with stage 5 chronic kidney disease or end stage renal disease: Secondary | ICD-10-CM | POA: Diagnosis not present

## 2018-04-04 DIAGNOSIS — N186 End stage renal disease: Secondary | ICD-10-CM | POA: Diagnosis not present

## 2018-04-05 DIAGNOSIS — N2581 Secondary hyperparathyroidism of renal origin: Secondary | ICD-10-CM | POA: Diagnosis not present

## 2018-04-05 DIAGNOSIS — D631 Anemia in chronic kidney disease: Secondary | ICD-10-CM | POA: Diagnosis not present

## 2018-04-05 DIAGNOSIS — N186 End stage renal disease: Secondary | ICD-10-CM | POA: Diagnosis not present

## 2018-04-07 DIAGNOSIS — D631 Anemia in chronic kidney disease: Secondary | ICD-10-CM | POA: Diagnosis not present

## 2018-04-07 DIAGNOSIS — N186 End stage renal disease: Secondary | ICD-10-CM | POA: Diagnosis not present

## 2018-04-07 DIAGNOSIS — N2581 Secondary hyperparathyroidism of renal origin: Secondary | ICD-10-CM | POA: Diagnosis not present

## 2018-04-09 DIAGNOSIS — N2581 Secondary hyperparathyroidism of renal origin: Secondary | ICD-10-CM | POA: Diagnosis not present

## 2018-04-09 DIAGNOSIS — D631 Anemia in chronic kidney disease: Secondary | ICD-10-CM | POA: Diagnosis not present

## 2018-04-09 DIAGNOSIS — N186 End stage renal disease: Secondary | ICD-10-CM | POA: Diagnosis not present

## 2018-04-12 DIAGNOSIS — N2581 Secondary hyperparathyroidism of renal origin: Secondary | ICD-10-CM | POA: Diagnosis not present

## 2018-04-12 DIAGNOSIS — N186 End stage renal disease: Secondary | ICD-10-CM | POA: Diagnosis not present

## 2018-04-12 DIAGNOSIS — D631 Anemia in chronic kidney disease: Secondary | ICD-10-CM | POA: Diagnosis not present

## 2018-04-14 DIAGNOSIS — N2581 Secondary hyperparathyroidism of renal origin: Secondary | ICD-10-CM | POA: Diagnosis not present

## 2018-04-14 DIAGNOSIS — D631 Anemia in chronic kidney disease: Secondary | ICD-10-CM | POA: Diagnosis not present

## 2018-04-14 DIAGNOSIS — N186 End stage renal disease: Secondary | ICD-10-CM | POA: Diagnosis not present

## 2018-04-16 DIAGNOSIS — N186 End stage renal disease: Secondary | ICD-10-CM | POA: Diagnosis not present

## 2018-04-16 DIAGNOSIS — D631 Anemia in chronic kidney disease: Secondary | ICD-10-CM | POA: Diagnosis not present

## 2018-04-16 DIAGNOSIS — N2581 Secondary hyperparathyroidism of renal origin: Secondary | ICD-10-CM | POA: Diagnosis not present

## 2018-04-19 DIAGNOSIS — D631 Anemia in chronic kidney disease: Secondary | ICD-10-CM | POA: Diagnosis not present

## 2018-04-19 DIAGNOSIS — N2581 Secondary hyperparathyroidism of renal origin: Secondary | ICD-10-CM | POA: Diagnosis not present

## 2018-04-19 DIAGNOSIS — N186 End stage renal disease: Secondary | ICD-10-CM | POA: Diagnosis not present

## 2018-04-20 DIAGNOSIS — M5136 Other intervertebral disc degeneration, lumbar region: Secondary | ICD-10-CM | POA: Diagnosis not present

## 2018-04-20 DIAGNOSIS — M545 Low back pain: Secondary | ICD-10-CM | POA: Diagnosis not present

## 2018-04-21 DIAGNOSIS — N2581 Secondary hyperparathyroidism of renal origin: Secondary | ICD-10-CM | POA: Diagnosis not present

## 2018-04-21 DIAGNOSIS — N186 End stage renal disease: Secondary | ICD-10-CM | POA: Diagnosis not present

## 2018-04-21 DIAGNOSIS — D631 Anemia in chronic kidney disease: Secondary | ICD-10-CM | POA: Diagnosis not present

## 2018-04-23 DIAGNOSIS — D631 Anemia in chronic kidney disease: Secondary | ICD-10-CM | POA: Diagnosis not present

## 2018-04-23 DIAGNOSIS — N186 End stage renal disease: Secondary | ICD-10-CM | POA: Diagnosis not present

## 2018-04-23 DIAGNOSIS — N2581 Secondary hyperparathyroidism of renal origin: Secondary | ICD-10-CM | POA: Diagnosis not present

## 2018-04-25 DIAGNOSIS — N186 End stage renal disease: Secondary | ICD-10-CM | POA: Diagnosis not present

## 2018-04-25 DIAGNOSIS — N2581 Secondary hyperparathyroidism of renal origin: Secondary | ICD-10-CM | POA: Diagnosis not present

## 2018-04-25 DIAGNOSIS — D631 Anemia in chronic kidney disease: Secondary | ICD-10-CM | POA: Diagnosis not present

## 2018-04-27 DIAGNOSIS — N2581 Secondary hyperparathyroidism of renal origin: Secondary | ICD-10-CM | POA: Diagnosis not present

## 2018-04-27 DIAGNOSIS — N186 End stage renal disease: Secondary | ICD-10-CM | POA: Diagnosis not present

## 2018-04-27 DIAGNOSIS — D631 Anemia in chronic kidney disease: Secondary | ICD-10-CM | POA: Diagnosis not present

## 2018-04-30 DIAGNOSIS — N2581 Secondary hyperparathyroidism of renal origin: Secondary | ICD-10-CM | POA: Diagnosis not present

## 2018-04-30 DIAGNOSIS — N186 End stage renal disease: Secondary | ICD-10-CM | POA: Diagnosis not present

## 2018-04-30 DIAGNOSIS — D631 Anemia in chronic kidney disease: Secondary | ICD-10-CM | POA: Diagnosis not present

## 2018-05-02 DIAGNOSIS — N2581 Secondary hyperparathyroidism of renal origin: Secondary | ICD-10-CM | POA: Diagnosis not present

## 2018-05-02 DIAGNOSIS — N186 End stage renal disease: Secondary | ICD-10-CM | POA: Diagnosis not present

## 2018-05-02 DIAGNOSIS — D631 Anemia in chronic kidney disease: Secondary | ICD-10-CM | POA: Diagnosis not present

## 2018-05-04 DIAGNOSIS — N2581 Secondary hyperparathyroidism of renal origin: Secondary | ICD-10-CM | POA: Diagnosis not present

## 2018-05-04 DIAGNOSIS — D631 Anemia in chronic kidney disease: Secondary | ICD-10-CM | POA: Diagnosis not present

## 2018-05-04 DIAGNOSIS — N186 End stage renal disease: Secondary | ICD-10-CM | POA: Diagnosis not present

## 2018-05-05 DIAGNOSIS — N186 End stage renal disease: Secondary | ICD-10-CM | POA: Diagnosis not present

## 2018-05-05 DIAGNOSIS — I120 Hypertensive chronic kidney disease with stage 5 chronic kidney disease or end stage renal disease: Secondary | ICD-10-CM | POA: Diagnosis not present

## 2018-05-05 DIAGNOSIS — Z992 Dependence on renal dialysis: Secondary | ICD-10-CM | POA: Diagnosis not present

## 2018-05-07 DIAGNOSIS — D631 Anemia in chronic kidney disease: Secondary | ICD-10-CM | POA: Diagnosis not present

## 2018-05-07 DIAGNOSIS — N186 End stage renal disease: Secondary | ICD-10-CM | POA: Diagnosis not present

## 2018-05-07 DIAGNOSIS — N2581 Secondary hyperparathyroidism of renal origin: Secondary | ICD-10-CM | POA: Diagnosis not present

## 2018-05-07 DIAGNOSIS — Z23 Encounter for immunization: Secondary | ICD-10-CM | POA: Diagnosis not present

## 2018-05-10 DIAGNOSIS — Z23 Encounter for immunization: Secondary | ICD-10-CM | POA: Diagnosis not present

## 2018-05-10 DIAGNOSIS — N186 End stage renal disease: Secondary | ICD-10-CM | POA: Diagnosis not present

## 2018-05-10 DIAGNOSIS — N2581 Secondary hyperparathyroidism of renal origin: Secondary | ICD-10-CM | POA: Diagnosis not present

## 2018-05-10 DIAGNOSIS — D631 Anemia in chronic kidney disease: Secondary | ICD-10-CM | POA: Diagnosis not present

## 2018-05-12 DIAGNOSIS — N186 End stage renal disease: Secondary | ICD-10-CM | POA: Diagnosis not present

## 2018-05-12 DIAGNOSIS — D631 Anemia in chronic kidney disease: Secondary | ICD-10-CM | POA: Diagnosis not present

## 2018-05-12 DIAGNOSIS — N2581 Secondary hyperparathyroidism of renal origin: Secondary | ICD-10-CM | POA: Diagnosis not present

## 2018-05-12 DIAGNOSIS — Z23 Encounter for immunization: Secondary | ICD-10-CM | POA: Diagnosis not present

## 2018-05-14 DIAGNOSIS — D631 Anemia in chronic kidney disease: Secondary | ICD-10-CM | POA: Diagnosis not present

## 2018-05-14 DIAGNOSIS — N186 End stage renal disease: Secondary | ICD-10-CM | POA: Diagnosis not present

## 2018-05-14 DIAGNOSIS — N2581 Secondary hyperparathyroidism of renal origin: Secondary | ICD-10-CM | POA: Diagnosis not present

## 2018-05-14 DIAGNOSIS — Z23 Encounter for immunization: Secondary | ICD-10-CM | POA: Diagnosis not present

## 2018-05-17 DIAGNOSIS — Z23 Encounter for immunization: Secondary | ICD-10-CM | POA: Diagnosis not present

## 2018-05-17 DIAGNOSIS — N186 End stage renal disease: Secondary | ICD-10-CM | POA: Diagnosis not present

## 2018-05-17 DIAGNOSIS — N2581 Secondary hyperparathyroidism of renal origin: Secondary | ICD-10-CM | POA: Diagnosis not present

## 2018-05-17 DIAGNOSIS — D631 Anemia in chronic kidney disease: Secondary | ICD-10-CM | POA: Diagnosis not present

## 2018-05-18 DIAGNOSIS — N2581 Secondary hyperparathyroidism of renal origin: Secondary | ICD-10-CM | POA: Diagnosis not present

## 2018-05-18 DIAGNOSIS — N186 End stage renal disease: Secondary | ICD-10-CM | POA: Diagnosis not present

## 2018-05-18 DIAGNOSIS — D631 Anemia in chronic kidney disease: Secondary | ICD-10-CM | POA: Diagnosis not present

## 2018-05-18 DIAGNOSIS — Z23 Encounter for immunization: Secondary | ICD-10-CM | POA: Diagnosis not present

## 2018-05-19 DIAGNOSIS — D631 Anemia in chronic kidney disease: Secondary | ICD-10-CM | POA: Diagnosis not present

## 2018-05-19 DIAGNOSIS — Z23 Encounter for immunization: Secondary | ICD-10-CM | POA: Diagnosis not present

## 2018-05-19 DIAGNOSIS — N186 End stage renal disease: Secondary | ICD-10-CM | POA: Diagnosis not present

## 2018-05-19 DIAGNOSIS — N2581 Secondary hyperparathyroidism of renal origin: Secondary | ICD-10-CM | POA: Diagnosis not present

## 2018-05-21 DIAGNOSIS — N2581 Secondary hyperparathyroidism of renal origin: Secondary | ICD-10-CM | POA: Diagnosis not present

## 2018-05-21 DIAGNOSIS — D631 Anemia in chronic kidney disease: Secondary | ICD-10-CM | POA: Diagnosis not present

## 2018-05-21 DIAGNOSIS — N186 End stage renal disease: Secondary | ICD-10-CM | POA: Diagnosis not present

## 2018-05-21 DIAGNOSIS — Z23 Encounter for immunization: Secondary | ICD-10-CM | POA: Diagnosis not present

## 2018-05-24 DIAGNOSIS — N2581 Secondary hyperparathyroidism of renal origin: Secondary | ICD-10-CM | POA: Diagnosis not present

## 2018-05-24 DIAGNOSIS — Z23 Encounter for immunization: Secondary | ICD-10-CM | POA: Diagnosis not present

## 2018-05-24 DIAGNOSIS — N186 End stage renal disease: Secondary | ICD-10-CM | POA: Diagnosis not present

## 2018-05-24 DIAGNOSIS — D631 Anemia in chronic kidney disease: Secondary | ICD-10-CM | POA: Diagnosis not present

## 2018-05-26 DIAGNOSIS — N2581 Secondary hyperparathyroidism of renal origin: Secondary | ICD-10-CM | POA: Diagnosis not present

## 2018-05-26 DIAGNOSIS — Z23 Encounter for immunization: Secondary | ICD-10-CM | POA: Diagnosis not present

## 2018-05-26 DIAGNOSIS — D631 Anemia in chronic kidney disease: Secondary | ICD-10-CM | POA: Diagnosis not present

## 2018-05-26 DIAGNOSIS — N186 End stage renal disease: Secondary | ICD-10-CM | POA: Diagnosis not present

## 2018-05-28 DIAGNOSIS — N2581 Secondary hyperparathyroidism of renal origin: Secondary | ICD-10-CM | POA: Diagnosis not present

## 2018-05-28 DIAGNOSIS — D631 Anemia in chronic kidney disease: Secondary | ICD-10-CM | POA: Diagnosis not present

## 2018-05-28 DIAGNOSIS — N186 End stage renal disease: Secondary | ICD-10-CM | POA: Diagnosis not present

## 2018-05-28 DIAGNOSIS — Z23 Encounter for immunization: Secondary | ICD-10-CM | POA: Diagnosis not present

## 2018-06-02 DIAGNOSIS — Z23 Encounter for immunization: Secondary | ICD-10-CM | POA: Diagnosis not present

## 2018-06-02 DIAGNOSIS — N186 End stage renal disease: Secondary | ICD-10-CM | POA: Diagnosis not present

## 2018-06-02 DIAGNOSIS — N2581 Secondary hyperparathyroidism of renal origin: Secondary | ICD-10-CM | POA: Diagnosis not present

## 2018-06-02 DIAGNOSIS — D631 Anemia in chronic kidney disease: Secondary | ICD-10-CM | POA: Diagnosis not present

## 2018-06-04 DIAGNOSIS — D631 Anemia in chronic kidney disease: Secondary | ICD-10-CM | POA: Diagnosis not present

## 2018-06-04 DIAGNOSIS — N186 End stage renal disease: Secondary | ICD-10-CM | POA: Diagnosis not present

## 2018-06-04 DIAGNOSIS — N2581 Secondary hyperparathyroidism of renal origin: Secondary | ICD-10-CM | POA: Diagnosis not present

## 2018-06-04 DIAGNOSIS — Z23 Encounter for immunization: Secondary | ICD-10-CM | POA: Diagnosis not present

## 2018-06-05 DIAGNOSIS — I120 Hypertensive chronic kidney disease with stage 5 chronic kidney disease or end stage renal disease: Secondary | ICD-10-CM | POA: Diagnosis not present

## 2018-06-05 DIAGNOSIS — Z992 Dependence on renal dialysis: Secondary | ICD-10-CM | POA: Diagnosis not present

## 2018-06-05 DIAGNOSIS — N186 End stage renal disease: Secondary | ICD-10-CM | POA: Diagnosis not present

## 2018-06-07 DIAGNOSIS — D631 Anemia in chronic kidney disease: Secondary | ICD-10-CM | POA: Diagnosis not present

## 2018-06-07 DIAGNOSIS — N186 End stage renal disease: Secondary | ICD-10-CM | POA: Diagnosis not present

## 2018-06-07 DIAGNOSIS — N2581 Secondary hyperparathyroidism of renal origin: Secondary | ICD-10-CM | POA: Diagnosis not present

## 2018-06-09 DIAGNOSIS — N186 End stage renal disease: Secondary | ICD-10-CM | POA: Diagnosis not present

## 2018-06-09 DIAGNOSIS — N2581 Secondary hyperparathyroidism of renal origin: Secondary | ICD-10-CM | POA: Diagnosis not present

## 2018-06-09 DIAGNOSIS — D631 Anemia in chronic kidney disease: Secondary | ICD-10-CM | POA: Diagnosis not present

## 2018-06-11 DIAGNOSIS — D631 Anemia in chronic kidney disease: Secondary | ICD-10-CM | POA: Diagnosis not present

## 2018-06-11 DIAGNOSIS — N186 End stage renal disease: Secondary | ICD-10-CM | POA: Diagnosis not present

## 2018-06-11 DIAGNOSIS — N2581 Secondary hyperparathyroidism of renal origin: Secondary | ICD-10-CM | POA: Diagnosis not present

## 2018-06-14 DIAGNOSIS — D631 Anemia in chronic kidney disease: Secondary | ICD-10-CM | POA: Diagnosis not present

## 2018-06-14 DIAGNOSIS — N186 End stage renal disease: Secondary | ICD-10-CM | POA: Diagnosis not present

## 2018-06-14 DIAGNOSIS — N2581 Secondary hyperparathyroidism of renal origin: Secondary | ICD-10-CM | POA: Diagnosis not present

## 2018-06-16 DIAGNOSIS — D631 Anemia in chronic kidney disease: Secondary | ICD-10-CM | POA: Diagnosis not present

## 2018-06-16 DIAGNOSIS — N186 End stage renal disease: Secondary | ICD-10-CM | POA: Diagnosis not present

## 2018-06-16 DIAGNOSIS — N2581 Secondary hyperparathyroidism of renal origin: Secondary | ICD-10-CM | POA: Diagnosis not present

## 2018-06-18 DIAGNOSIS — N2581 Secondary hyperparathyroidism of renal origin: Secondary | ICD-10-CM | POA: Diagnosis not present

## 2018-06-18 DIAGNOSIS — D631 Anemia in chronic kidney disease: Secondary | ICD-10-CM | POA: Diagnosis not present

## 2018-06-18 DIAGNOSIS — N186 End stage renal disease: Secondary | ICD-10-CM | POA: Diagnosis not present

## 2018-06-21 DIAGNOSIS — N2581 Secondary hyperparathyroidism of renal origin: Secondary | ICD-10-CM | POA: Diagnosis not present

## 2018-06-21 DIAGNOSIS — N186 End stage renal disease: Secondary | ICD-10-CM | POA: Diagnosis not present

## 2018-06-21 DIAGNOSIS — D631 Anemia in chronic kidney disease: Secondary | ICD-10-CM | POA: Diagnosis not present

## 2018-06-23 DIAGNOSIS — N2581 Secondary hyperparathyroidism of renal origin: Secondary | ICD-10-CM | POA: Diagnosis not present

## 2018-06-23 DIAGNOSIS — N186 End stage renal disease: Secondary | ICD-10-CM | POA: Diagnosis not present

## 2018-06-23 DIAGNOSIS — D631 Anemia in chronic kidney disease: Secondary | ICD-10-CM | POA: Diagnosis not present

## 2018-06-25 DIAGNOSIS — N186 End stage renal disease: Secondary | ICD-10-CM | POA: Diagnosis not present

## 2018-06-25 DIAGNOSIS — D631 Anemia in chronic kidney disease: Secondary | ICD-10-CM | POA: Diagnosis not present

## 2018-06-25 DIAGNOSIS — N2581 Secondary hyperparathyroidism of renal origin: Secondary | ICD-10-CM | POA: Diagnosis not present

## 2018-06-28 DIAGNOSIS — N186 End stage renal disease: Secondary | ICD-10-CM | POA: Diagnosis not present

## 2018-06-28 DIAGNOSIS — N2581 Secondary hyperparathyroidism of renal origin: Secondary | ICD-10-CM | POA: Diagnosis not present

## 2018-06-28 DIAGNOSIS — D631 Anemia in chronic kidney disease: Secondary | ICD-10-CM | POA: Diagnosis not present

## 2018-06-30 DIAGNOSIS — D631 Anemia in chronic kidney disease: Secondary | ICD-10-CM | POA: Diagnosis not present

## 2018-06-30 DIAGNOSIS — N186 End stage renal disease: Secondary | ICD-10-CM | POA: Diagnosis not present

## 2018-06-30 DIAGNOSIS — N2581 Secondary hyperparathyroidism of renal origin: Secondary | ICD-10-CM | POA: Diagnosis not present

## 2018-07-02 DIAGNOSIS — D631 Anemia in chronic kidney disease: Secondary | ICD-10-CM | POA: Diagnosis not present

## 2018-07-02 DIAGNOSIS — N2581 Secondary hyperparathyroidism of renal origin: Secondary | ICD-10-CM | POA: Diagnosis not present

## 2018-07-02 DIAGNOSIS — N186 End stage renal disease: Secondary | ICD-10-CM | POA: Diagnosis not present

## 2018-07-04 DIAGNOSIS — Z992 Dependence on renal dialysis: Secondary | ICD-10-CM | POA: Diagnosis not present

## 2018-07-04 DIAGNOSIS — N186 End stage renal disease: Secondary | ICD-10-CM | POA: Diagnosis not present

## 2018-07-04 DIAGNOSIS — I120 Hypertensive chronic kidney disease with stage 5 chronic kidney disease or end stage renal disease: Secondary | ICD-10-CM | POA: Diagnosis not present

## 2018-07-05 DIAGNOSIS — N2581 Secondary hyperparathyroidism of renal origin: Secondary | ICD-10-CM | POA: Diagnosis not present

## 2018-07-05 DIAGNOSIS — D631 Anemia in chronic kidney disease: Secondary | ICD-10-CM | POA: Diagnosis not present

## 2018-07-05 DIAGNOSIS — N186 End stage renal disease: Secondary | ICD-10-CM | POA: Diagnosis not present

## 2018-07-07 DIAGNOSIS — N186 End stage renal disease: Secondary | ICD-10-CM | POA: Diagnosis not present

## 2018-07-07 DIAGNOSIS — D631 Anemia in chronic kidney disease: Secondary | ICD-10-CM | POA: Diagnosis not present

## 2018-07-07 DIAGNOSIS — N2581 Secondary hyperparathyroidism of renal origin: Secondary | ICD-10-CM | POA: Diagnosis not present

## 2018-07-09 DIAGNOSIS — D631 Anemia in chronic kidney disease: Secondary | ICD-10-CM | POA: Diagnosis not present

## 2018-07-09 DIAGNOSIS — N186 End stage renal disease: Secondary | ICD-10-CM | POA: Diagnosis not present

## 2018-07-09 DIAGNOSIS — N2581 Secondary hyperparathyroidism of renal origin: Secondary | ICD-10-CM | POA: Diagnosis not present

## 2018-07-12 DIAGNOSIS — N2581 Secondary hyperparathyroidism of renal origin: Secondary | ICD-10-CM | POA: Diagnosis not present

## 2018-07-12 DIAGNOSIS — N186 End stage renal disease: Secondary | ICD-10-CM | POA: Diagnosis not present

## 2018-07-12 DIAGNOSIS — D631 Anemia in chronic kidney disease: Secondary | ICD-10-CM | POA: Diagnosis not present

## 2018-07-14 DIAGNOSIS — N2581 Secondary hyperparathyroidism of renal origin: Secondary | ICD-10-CM | POA: Diagnosis not present

## 2018-07-14 DIAGNOSIS — D631 Anemia in chronic kidney disease: Secondary | ICD-10-CM | POA: Diagnosis not present

## 2018-07-14 DIAGNOSIS — N186 End stage renal disease: Secondary | ICD-10-CM | POA: Diagnosis not present

## 2018-07-16 DIAGNOSIS — N186 End stage renal disease: Secondary | ICD-10-CM | POA: Diagnosis not present

## 2018-07-16 DIAGNOSIS — N2581 Secondary hyperparathyroidism of renal origin: Secondary | ICD-10-CM | POA: Diagnosis not present

## 2018-07-16 DIAGNOSIS — D631 Anemia in chronic kidney disease: Secondary | ICD-10-CM | POA: Diagnosis not present

## 2018-07-19 DIAGNOSIS — N2581 Secondary hyperparathyroidism of renal origin: Secondary | ICD-10-CM | POA: Diagnosis not present

## 2018-07-19 DIAGNOSIS — N186 End stage renal disease: Secondary | ICD-10-CM | POA: Diagnosis not present

## 2018-07-19 DIAGNOSIS — D631 Anemia in chronic kidney disease: Secondary | ICD-10-CM | POA: Diagnosis not present

## 2018-07-21 DIAGNOSIS — N186 End stage renal disease: Secondary | ICD-10-CM | POA: Diagnosis not present

## 2018-07-21 DIAGNOSIS — D631 Anemia in chronic kidney disease: Secondary | ICD-10-CM | POA: Diagnosis not present

## 2018-07-21 DIAGNOSIS — N2581 Secondary hyperparathyroidism of renal origin: Secondary | ICD-10-CM | POA: Diagnosis not present

## 2018-07-23 DIAGNOSIS — D631 Anemia in chronic kidney disease: Secondary | ICD-10-CM | POA: Diagnosis not present

## 2018-07-23 DIAGNOSIS — N2581 Secondary hyperparathyroidism of renal origin: Secondary | ICD-10-CM | POA: Diagnosis not present

## 2018-07-23 DIAGNOSIS — N186 End stage renal disease: Secondary | ICD-10-CM | POA: Diagnosis not present

## 2018-07-26 DIAGNOSIS — D631 Anemia in chronic kidney disease: Secondary | ICD-10-CM | POA: Diagnosis not present

## 2018-07-26 DIAGNOSIS — N2581 Secondary hyperparathyroidism of renal origin: Secondary | ICD-10-CM | POA: Diagnosis not present

## 2018-07-26 DIAGNOSIS — N186 End stage renal disease: Secondary | ICD-10-CM | POA: Diagnosis not present

## 2018-07-28 DIAGNOSIS — D631 Anemia in chronic kidney disease: Secondary | ICD-10-CM | POA: Diagnosis not present

## 2018-07-28 DIAGNOSIS — N2581 Secondary hyperparathyroidism of renal origin: Secondary | ICD-10-CM | POA: Diagnosis not present

## 2018-07-28 DIAGNOSIS — N186 End stage renal disease: Secondary | ICD-10-CM | POA: Diagnosis not present

## 2018-07-30 DIAGNOSIS — D631 Anemia in chronic kidney disease: Secondary | ICD-10-CM | POA: Diagnosis not present

## 2018-07-30 DIAGNOSIS — N2581 Secondary hyperparathyroidism of renal origin: Secondary | ICD-10-CM | POA: Diagnosis not present

## 2018-07-30 DIAGNOSIS — N186 End stage renal disease: Secondary | ICD-10-CM | POA: Diagnosis not present

## 2018-08-02 DIAGNOSIS — N186 End stage renal disease: Secondary | ICD-10-CM | POA: Diagnosis not present

## 2018-08-02 DIAGNOSIS — N2581 Secondary hyperparathyroidism of renal origin: Secondary | ICD-10-CM | POA: Diagnosis not present

## 2018-08-02 DIAGNOSIS — D631 Anemia in chronic kidney disease: Secondary | ICD-10-CM | POA: Diagnosis not present

## 2018-08-04 DIAGNOSIS — Z992 Dependence on renal dialysis: Secondary | ICD-10-CM | POA: Diagnosis not present

## 2018-08-04 DIAGNOSIS — N2581 Secondary hyperparathyroidism of renal origin: Secondary | ICD-10-CM | POA: Diagnosis not present

## 2018-08-04 DIAGNOSIS — D631 Anemia in chronic kidney disease: Secondary | ICD-10-CM | POA: Diagnosis not present

## 2018-08-04 DIAGNOSIS — I120 Hypertensive chronic kidney disease with stage 5 chronic kidney disease or end stage renal disease: Secondary | ICD-10-CM | POA: Diagnosis not present

## 2018-08-04 DIAGNOSIS — N186 End stage renal disease: Secondary | ICD-10-CM | POA: Diagnosis not present

## 2018-08-06 DIAGNOSIS — N2581 Secondary hyperparathyroidism of renal origin: Secondary | ICD-10-CM | POA: Diagnosis not present

## 2018-08-06 DIAGNOSIS — D631 Anemia in chronic kidney disease: Secondary | ICD-10-CM | POA: Diagnosis not present

## 2018-08-06 DIAGNOSIS — N186 End stage renal disease: Secondary | ICD-10-CM | POA: Diagnosis not present

## 2018-08-09 DIAGNOSIS — N186 End stage renal disease: Secondary | ICD-10-CM | POA: Diagnosis not present

## 2018-08-09 DIAGNOSIS — N2581 Secondary hyperparathyroidism of renal origin: Secondary | ICD-10-CM | POA: Diagnosis not present

## 2018-08-09 DIAGNOSIS — D631 Anemia in chronic kidney disease: Secondary | ICD-10-CM | POA: Diagnosis not present

## 2018-08-11 DIAGNOSIS — N2581 Secondary hyperparathyroidism of renal origin: Secondary | ICD-10-CM | POA: Diagnosis not present

## 2018-08-11 DIAGNOSIS — D631 Anemia in chronic kidney disease: Secondary | ICD-10-CM | POA: Diagnosis not present

## 2018-08-11 DIAGNOSIS — N186 End stage renal disease: Secondary | ICD-10-CM | POA: Diagnosis not present

## 2018-08-13 DIAGNOSIS — N2581 Secondary hyperparathyroidism of renal origin: Secondary | ICD-10-CM | POA: Diagnosis not present

## 2018-08-13 DIAGNOSIS — D631 Anemia in chronic kidney disease: Secondary | ICD-10-CM | POA: Diagnosis not present

## 2018-08-13 DIAGNOSIS — N186 End stage renal disease: Secondary | ICD-10-CM | POA: Diagnosis not present

## 2018-08-16 DIAGNOSIS — N2581 Secondary hyperparathyroidism of renal origin: Secondary | ICD-10-CM | POA: Diagnosis not present

## 2018-08-16 DIAGNOSIS — D631 Anemia in chronic kidney disease: Secondary | ICD-10-CM | POA: Diagnosis not present

## 2018-08-16 DIAGNOSIS — N186 End stage renal disease: Secondary | ICD-10-CM | POA: Diagnosis not present

## 2018-08-17 DIAGNOSIS — M545 Low back pain: Secondary | ICD-10-CM | POA: Diagnosis not present

## 2018-08-17 DIAGNOSIS — Z79891 Long term (current) use of opiate analgesic: Secondary | ICD-10-CM | POA: Diagnosis not present

## 2018-08-17 DIAGNOSIS — G894 Chronic pain syndrome: Secondary | ICD-10-CM | POA: Diagnosis not present

## 2018-08-17 DIAGNOSIS — M5136 Other intervertebral disc degeneration, lumbar region: Secondary | ICD-10-CM | POA: Diagnosis not present

## 2018-08-18 DIAGNOSIS — N186 End stage renal disease: Secondary | ICD-10-CM | POA: Diagnosis not present

## 2018-08-18 DIAGNOSIS — D631 Anemia in chronic kidney disease: Secondary | ICD-10-CM | POA: Diagnosis not present

## 2018-08-18 DIAGNOSIS — N2581 Secondary hyperparathyroidism of renal origin: Secondary | ICD-10-CM | POA: Diagnosis not present

## 2018-08-20 DIAGNOSIS — N186 End stage renal disease: Secondary | ICD-10-CM | POA: Diagnosis not present

## 2018-08-20 DIAGNOSIS — D631 Anemia in chronic kidney disease: Secondary | ICD-10-CM | POA: Diagnosis not present

## 2018-08-20 DIAGNOSIS — N2581 Secondary hyperparathyroidism of renal origin: Secondary | ICD-10-CM | POA: Diagnosis not present

## 2018-08-23 DIAGNOSIS — N2581 Secondary hyperparathyroidism of renal origin: Secondary | ICD-10-CM | POA: Diagnosis not present

## 2018-08-23 DIAGNOSIS — N186 End stage renal disease: Secondary | ICD-10-CM | POA: Diagnosis not present

## 2018-08-23 DIAGNOSIS — D631 Anemia in chronic kidney disease: Secondary | ICD-10-CM | POA: Diagnosis not present

## 2018-08-25 DIAGNOSIS — D631 Anemia in chronic kidney disease: Secondary | ICD-10-CM | POA: Diagnosis not present

## 2018-08-25 DIAGNOSIS — N186 End stage renal disease: Secondary | ICD-10-CM | POA: Diagnosis not present

## 2018-08-25 DIAGNOSIS — N2581 Secondary hyperparathyroidism of renal origin: Secondary | ICD-10-CM | POA: Diagnosis not present

## 2018-08-27 DIAGNOSIS — N186 End stage renal disease: Secondary | ICD-10-CM | POA: Diagnosis not present

## 2018-08-27 DIAGNOSIS — N2581 Secondary hyperparathyroidism of renal origin: Secondary | ICD-10-CM | POA: Diagnosis not present

## 2018-08-27 DIAGNOSIS — D631 Anemia in chronic kidney disease: Secondary | ICD-10-CM | POA: Diagnosis not present

## 2018-08-30 DIAGNOSIS — N186 End stage renal disease: Secondary | ICD-10-CM | POA: Diagnosis not present

## 2018-08-30 DIAGNOSIS — D631 Anemia in chronic kidney disease: Secondary | ICD-10-CM | POA: Diagnosis not present

## 2018-08-30 DIAGNOSIS — N2581 Secondary hyperparathyroidism of renal origin: Secondary | ICD-10-CM | POA: Diagnosis not present

## 2018-09-01 DIAGNOSIS — D631 Anemia in chronic kidney disease: Secondary | ICD-10-CM | POA: Diagnosis not present

## 2018-09-01 DIAGNOSIS — N2581 Secondary hyperparathyroidism of renal origin: Secondary | ICD-10-CM | POA: Diagnosis not present

## 2018-09-01 DIAGNOSIS — N186 End stage renal disease: Secondary | ICD-10-CM | POA: Diagnosis not present

## 2018-09-03 ENCOUNTER — Other Ambulatory Visit: Payer: Self-pay | Admitting: Nephrology

## 2018-09-03 DIAGNOSIS — R5381 Other malaise: Secondary | ICD-10-CM

## 2018-09-03 DIAGNOSIS — I120 Hypertensive chronic kidney disease with stage 5 chronic kidney disease or end stage renal disease: Secondary | ICD-10-CM | POA: Diagnosis not present

## 2018-09-03 DIAGNOSIS — N186 End stage renal disease: Secondary | ICD-10-CM | POA: Diagnosis not present

## 2018-09-03 DIAGNOSIS — N2581 Secondary hyperparathyroidism of renal origin: Secondary | ICD-10-CM | POA: Diagnosis not present

## 2018-09-03 DIAGNOSIS — Z992 Dependence on renal dialysis: Secondary | ICD-10-CM | POA: Diagnosis not present

## 2018-09-03 DIAGNOSIS — D631 Anemia in chronic kidney disease: Secondary | ICD-10-CM | POA: Diagnosis not present

## 2018-09-06 ENCOUNTER — Other Ambulatory Visit: Payer: Self-pay | Admitting: Nephrology

## 2018-09-06 DIAGNOSIS — N2581 Secondary hyperparathyroidism of renal origin: Secondary | ICD-10-CM | POA: Diagnosis not present

## 2018-09-06 DIAGNOSIS — D631 Anemia in chronic kidney disease: Secondary | ICD-10-CM | POA: Diagnosis not present

## 2018-09-06 DIAGNOSIS — N2889 Other specified disorders of kidney and ureter: Secondary | ICD-10-CM

## 2018-09-06 DIAGNOSIS — N186 End stage renal disease: Secondary | ICD-10-CM | POA: Diagnosis not present

## 2018-09-08 DIAGNOSIS — N2581 Secondary hyperparathyroidism of renal origin: Secondary | ICD-10-CM | POA: Diagnosis not present

## 2018-09-08 DIAGNOSIS — N186 End stage renal disease: Secondary | ICD-10-CM | POA: Diagnosis not present

## 2018-09-08 DIAGNOSIS — D631 Anemia in chronic kidney disease: Secondary | ICD-10-CM | POA: Diagnosis not present

## 2018-09-10 DIAGNOSIS — D631 Anemia in chronic kidney disease: Secondary | ICD-10-CM | POA: Diagnosis not present

## 2018-09-10 DIAGNOSIS — N2581 Secondary hyperparathyroidism of renal origin: Secondary | ICD-10-CM | POA: Diagnosis not present

## 2018-09-10 DIAGNOSIS — N186 End stage renal disease: Secondary | ICD-10-CM | POA: Diagnosis not present

## 2018-09-13 DIAGNOSIS — D631 Anemia in chronic kidney disease: Secondary | ICD-10-CM | POA: Diagnosis not present

## 2018-09-13 DIAGNOSIS — N186 End stage renal disease: Secondary | ICD-10-CM | POA: Diagnosis not present

## 2018-09-13 DIAGNOSIS — N2581 Secondary hyperparathyroidism of renal origin: Secondary | ICD-10-CM | POA: Diagnosis not present

## 2018-09-15 DIAGNOSIS — N2581 Secondary hyperparathyroidism of renal origin: Secondary | ICD-10-CM | POA: Diagnosis not present

## 2018-09-15 DIAGNOSIS — D631 Anemia in chronic kidney disease: Secondary | ICD-10-CM | POA: Diagnosis not present

## 2018-09-15 DIAGNOSIS — N186 End stage renal disease: Secondary | ICD-10-CM | POA: Diagnosis not present

## 2018-09-17 DIAGNOSIS — N2581 Secondary hyperparathyroidism of renal origin: Secondary | ICD-10-CM | POA: Diagnosis not present

## 2018-09-17 DIAGNOSIS — N186 End stage renal disease: Secondary | ICD-10-CM | POA: Diagnosis not present

## 2018-09-17 DIAGNOSIS — D631 Anemia in chronic kidney disease: Secondary | ICD-10-CM | POA: Diagnosis not present

## 2018-09-20 DIAGNOSIS — D631 Anemia in chronic kidney disease: Secondary | ICD-10-CM | POA: Diagnosis not present

## 2018-09-20 DIAGNOSIS — N186 End stage renal disease: Secondary | ICD-10-CM | POA: Diagnosis not present

## 2018-09-20 DIAGNOSIS — N2581 Secondary hyperparathyroidism of renal origin: Secondary | ICD-10-CM | POA: Diagnosis not present

## 2018-09-21 ENCOUNTER — Other Ambulatory Visit: Payer: Medicare Other

## 2018-09-24 DIAGNOSIS — N186 End stage renal disease: Secondary | ICD-10-CM | POA: Diagnosis not present

## 2018-09-24 DIAGNOSIS — N2581 Secondary hyperparathyroidism of renal origin: Secondary | ICD-10-CM | POA: Diagnosis not present

## 2018-09-24 DIAGNOSIS — D631 Anemia in chronic kidney disease: Secondary | ICD-10-CM | POA: Diagnosis not present

## 2018-09-27 DIAGNOSIS — D631 Anemia in chronic kidney disease: Secondary | ICD-10-CM | POA: Diagnosis not present

## 2018-09-27 DIAGNOSIS — N2581 Secondary hyperparathyroidism of renal origin: Secondary | ICD-10-CM | POA: Diagnosis not present

## 2018-09-27 DIAGNOSIS — N186 End stage renal disease: Secondary | ICD-10-CM | POA: Diagnosis not present

## 2018-09-28 ENCOUNTER — Ambulatory Visit
Admission: RE | Admit: 2018-09-28 | Discharge: 2018-09-28 | Disposition: A | Discharge status: Home / Self Care | Payer: Medicare Other | Source: Ambulatory Visit | Attending: Nephrology | Admitting: Nephrology

## 2018-09-28 DIAGNOSIS — N2889 Other specified disorders of kidney and ureter: Secondary | ICD-10-CM

## 2018-09-29 DIAGNOSIS — D631 Anemia in chronic kidney disease: Secondary | ICD-10-CM | POA: Diagnosis not present

## 2018-09-29 DIAGNOSIS — N186 End stage renal disease: Secondary | ICD-10-CM | POA: Diagnosis not present

## 2018-09-29 DIAGNOSIS — N2581 Secondary hyperparathyroidism of renal origin: Secondary | ICD-10-CM | POA: Diagnosis not present

## 2018-10-01 DIAGNOSIS — N2581 Secondary hyperparathyroidism of renal origin: Secondary | ICD-10-CM | POA: Diagnosis not present

## 2018-10-01 DIAGNOSIS — N186 End stage renal disease: Secondary | ICD-10-CM | POA: Diagnosis not present

## 2018-10-01 DIAGNOSIS — D631 Anemia in chronic kidney disease: Secondary | ICD-10-CM | POA: Diagnosis not present

## 2018-10-04 DIAGNOSIS — D631 Anemia in chronic kidney disease: Secondary | ICD-10-CM | POA: Diagnosis not present

## 2018-10-04 DIAGNOSIS — Z992 Dependence on renal dialysis: Secondary | ICD-10-CM | POA: Diagnosis not present

## 2018-10-04 DIAGNOSIS — N186 End stage renal disease: Secondary | ICD-10-CM | POA: Diagnosis not present

## 2018-10-04 DIAGNOSIS — N2581 Secondary hyperparathyroidism of renal origin: Secondary | ICD-10-CM | POA: Diagnosis not present

## 2018-10-04 DIAGNOSIS — I120 Hypertensive chronic kidney disease with stage 5 chronic kidney disease or end stage renal disease: Secondary | ICD-10-CM | POA: Diagnosis not present

## 2018-10-05 DIAGNOSIS — N2581 Secondary hyperparathyroidism of renal origin: Secondary | ICD-10-CM | POA: Diagnosis not present

## 2018-10-05 DIAGNOSIS — N186 End stage renal disease: Secondary | ICD-10-CM | POA: Diagnosis not present

## 2018-10-05 DIAGNOSIS — D631 Anemia in chronic kidney disease: Secondary | ICD-10-CM | POA: Diagnosis not present

## 2018-10-06 DIAGNOSIS — N186 End stage renal disease: Secondary | ICD-10-CM | POA: Diagnosis not present

## 2018-10-06 DIAGNOSIS — D631 Anemia in chronic kidney disease: Secondary | ICD-10-CM | POA: Diagnosis not present

## 2018-10-06 DIAGNOSIS — N2581 Secondary hyperparathyroidism of renal origin: Secondary | ICD-10-CM | POA: Diagnosis not present

## 2018-10-08 DIAGNOSIS — D631 Anemia in chronic kidney disease: Secondary | ICD-10-CM | POA: Diagnosis not present

## 2018-10-08 DIAGNOSIS — N186 End stage renal disease: Secondary | ICD-10-CM | POA: Diagnosis not present

## 2018-10-08 DIAGNOSIS — N2581 Secondary hyperparathyroidism of renal origin: Secondary | ICD-10-CM | POA: Diagnosis not present

## 2018-10-11 DIAGNOSIS — D631 Anemia in chronic kidney disease: Secondary | ICD-10-CM | POA: Diagnosis not present

## 2018-10-11 DIAGNOSIS — N2581 Secondary hyperparathyroidism of renal origin: Secondary | ICD-10-CM | POA: Diagnosis not present

## 2018-10-11 DIAGNOSIS — N186 End stage renal disease: Secondary | ICD-10-CM | POA: Diagnosis not present

## 2018-10-13 DIAGNOSIS — N2581 Secondary hyperparathyroidism of renal origin: Secondary | ICD-10-CM | POA: Diagnosis not present

## 2018-10-13 DIAGNOSIS — N186 End stage renal disease: Secondary | ICD-10-CM | POA: Diagnosis not present

## 2018-10-13 DIAGNOSIS — D631 Anemia in chronic kidney disease: Secondary | ICD-10-CM | POA: Diagnosis not present

## 2018-10-15 DIAGNOSIS — N2581 Secondary hyperparathyroidism of renal origin: Secondary | ICD-10-CM | POA: Diagnosis not present

## 2018-10-15 DIAGNOSIS — D631 Anemia in chronic kidney disease: Secondary | ICD-10-CM | POA: Diagnosis not present

## 2018-10-15 DIAGNOSIS — N186 End stage renal disease: Secondary | ICD-10-CM | POA: Diagnosis not present

## 2018-10-18 ENCOUNTER — Other Ambulatory Visit: Payer: Self-pay | Admitting: Nephrology

## 2018-10-18 DIAGNOSIS — N186 End stage renal disease: Secondary | ICD-10-CM | POA: Diagnosis not present

## 2018-10-18 DIAGNOSIS — N2889 Other specified disorders of kidney and ureter: Secondary | ICD-10-CM

## 2018-10-18 DIAGNOSIS — D631 Anemia in chronic kidney disease: Secondary | ICD-10-CM | POA: Diagnosis not present

## 2018-10-18 DIAGNOSIS — N2581 Secondary hyperparathyroidism of renal origin: Secondary | ICD-10-CM | POA: Diagnosis not present

## 2018-10-20 DIAGNOSIS — N186 End stage renal disease: Secondary | ICD-10-CM | POA: Diagnosis not present

## 2018-10-20 DIAGNOSIS — N2581 Secondary hyperparathyroidism of renal origin: Secondary | ICD-10-CM | POA: Diagnosis not present

## 2018-10-20 DIAGNOSIS — D631 Anemia in chronic kidney disease: Secondary | ICD-10-CM | POA: Diagnosis not present

## 2018-10-22 DIAGNOSIS — D631 Anemia in chronic kidney disease: Secondary | ICD-10-CM | POA: Diagnosis not present

## 2018-10-22 DIAGNOSIS — N2581 Secondary hyperparathyroidism of renal origin: Secondary | ICD-10-CM | POA: Diagnosis not present

## 2018-10-22 DIAGNOSIS — N186 End stage renal disease: Secondary | ICD-10-CM | POA: Diagnosis not present

## 2018-10-25 DIAGNOSIS — N2581 Secondary hyperparathyroidism of renal origin: Secondary | ICD-10-CM | POA: Diagnosis not present

## 2018-10-25 DIAGNOSIS — N186 End stage renal disease: Secondary | ICD-10-CM | POA: Diagnosis not present

## 2018-10-25 DIAGNOSIS — D631 Anemia in chronic kidney disease: Secondary | ICD-10-CM | POA: Diagnosis not present

## 2018-10-27 DIAGNOSIS — D631 Anemia in chronic kidney disease: Secondary | ICD-10-CM | POA: Diagnosis not present

## 2018-10-27 DIAGNOSIS — N2581 Secondary hyperparathyroidism of renal origin: Secondary | ICD-10-CM | POA: Diagnosis not present

## 2018-10-27 DIAGNOSIS — N186 End stage renal disease: Secondary | ICD-10-CM | POA: Diagnosis not present

## 2018-10-29 DIAGNOSIS — N186 End stage renal disease: Secondary | ICD-10-CM | POA: Diagnosis not present

## 2018-10-29 DIAGNOSIS — D631 Anemia in chronic kidney disease: Secondary | ICD-10-CM | POA: Diagnosis not present

## 2018-10-29 DIAGNOSIS — N2581 Secondary hyperparathyroidism of renal origin: Secondary | ICD-10-CM | POA: Diagnosis not present

## 2018-11-01 DIAGNOSIS — N2581 Secondary hyperparathyroidism of renal origin: Secondary | ICD-10-CM | POA: Diagnosis not present

## 2018-11-01 DIAGNOSIS — D631 Anemia in chronic kidney disease: Secondary | ICD-10-CM | POA: Diagnosis not present

## 2018-11-01 DIAGNOSIS — N186 End stage renal disease: Secondary | ICD-10-CM | POA: Diagnosis not present

## 2018-11-03 ENCOUNTER — Other Ambulatory Visit: Payer: Medicare Other

## 2018-11-03 DIAGNOSIS — Z992 Dependence on renal dialysis: Secondary | ICD-10-CM | POA: Diagnosis not present

## 2018-11-03 DIAGNOSIS — D631 Anemia in chronic kidney disease: Secondary | ICD-10-CM | POA: Diagnosis not present

## 2018-11-03 DIAGNOSIS — N2581 Secondary hyperparathyroidism of renal origin: Secondary | ICD-10-CM | POA: Diagnosis not present

## 2018-11-03 DIAGNOSIS — N186 End stage renal disease: Secondary | ICD-10-CM | POA: Diagnosis not present

## 2018-11-03 DIAGNOSIS — I120 Hypertensive chronic kidney disease with stage 5 chronic kidney disease or end stage renal disease: Secondary | ICD-10-CM | POA: Diagnosis not present

## 2018-11-05 DIAGNOSIS — N186 End stage renal disease: Secondary | ICD-10-CM | POA: Diagnosis not present

## 2018-11-05 DIAGNOSIS — N2581 Secondary hyperparathyroidism of renal origin: Secondary | ICD-10-CM | POA: Diagnosis not present

## 2018-11-05 DIAGNOSIS — D631 Anemia in chronic kidney disease: Secondary | ICD-10-CM | POA: Diagnosis not present

## 2018-11-08 DIAGNOSIS — D631 Anemia in chronic kidney disease: Secondary | ICD-10-CM | POA: Diagnosis not present

## 2018-11-08 DIAGNOSIS — N2581 Secondary hyperparathyroidism of renal origin: Secondary | ICD-10-CM | POA: Diagnosis not present

## 2018-11-08 DIAGNOSIS — N186 End stage renal disease: Secondary | ICD-10-CM | POA: Diagnosis not present

## 2018-11-09 ENCOUNTER — Other Ambulatory Visit: Payer: Self-pay | Admitting: Nephrology

## 2018-11-09 ENCOUNTER — Ambulatory Visit
Admission: RE | Admit: 2018-11-09 | Discharge: 2018-11-09 | Disposition: A | Discharge status: Home / Self Care | Payer: Medicare Other | Source: Ambulatory Visit | Attending: Nephrology | Admitting: Nephrology

## 2018-11-09 ENCOUNTER — Other Ambulatory Visit: Payer: Self-pay

## 2018-11-09 DIAGNOSIS — N2889 Other specified disorders of kidney and ureter: Secondary | ICD-10-CM

## 2018-11-09 DIAGNOSIS — K7460 Unspecified cirrhosis of liver: Secondary | ICD-10-CM | POA: Diagnosis not present

## 2018-11-09 DIAGNOSIS — K8020 Calculus of gallbladder without cholecystitis without obstruction: Secondary | ICD-10-CM | POA: Diagnosis not present

## 2018-11-09 MED ORDER — IOPAMIDOL (ISOVUE-300) INJECTION 61%
100.0000 mL | Freq: Once | INTRAVENOUS | Status: AC | PRN
  Administered 2018-11-09: 100 mL via INTRAVENOUS

## 2018-11-10 DIAGNOSIS — N2581 Secondary hyperparathyroidism of renal origin: Secondary | ICD-10-CM | POA: Diagnosis not present

## 2018-11-10 DIAGNOSIS — D631 Anemia in chronic kidney disease: Secondary | ICD-10-CM | POA: Diagnosis not present

## 2018-11-10 DIAGNOSIS — N186 End stage renal disease: Secondary | ICD-10-CM | POA: Diagnosis not present

## 2018-11-12 DIAGNOSIS — D631 Anemia in chronic kidney disease: Secondary | ICD-10-CM | POA: Diagnosis not present

## 2018-11-12 DIAGNOSIS — N186 End stage renal disease: Secondary | ICD-10-CM | POA: Diagnosis not present

## 2018-11-12 DIAGNOSIS — N2581 Secondary hyperparathyroidism of renal origin: Secondary | ICD-10-CM | POA: Diagnosis not present

## 2018-11-15 DIAGNOSIS — N2581 Secondary hyperparathyroidism of renal origin: Secondary | ICD-10-CM | POA: Diagnosis not present

## 2018-11-15 DIAGNOSIS — D631 Anemia in chronic kidney disease: Secondary | ICD-10-CM | POA: Diagnosis not present

## 2018-11-15 DIAGNOSIS — N186 End stage renal disease: Secondary | ICD-10-CM | POA: Diagnosis not present

## 2018-11-17 DIAGNOSIS — D631 Anemia in chronic kidney disease: Secondary | ICD-10-CM | POA: Diagnosis not present

## 2018-11-17 DIAGNOSIS — N2581 Secondary hyperparathyroidism of renal origin: Secondary | ICD-10-CM | POA: Diagnosis not present

## 2018-11-17 DIAGNOSIS — N186 End stage renal disease: Secondary | ICD-10-CM | POA: Diagnosis not present

## 2018-11-19 DIAGNOSIS — N2581 Secondary hyperparathyroidism of renal origin: Secondary | ICD-10-CM | POA: Diagnosis not present

## 2018-11-19 DIAGNOSIS — D631 Anemia in chronic kidney disease: Secondary | ICD-10-CM | POA: Diagnosis not present

## 2018-11-19 DIAGNOSIS — N186 End stage renal disease: Secondary | ICD-10-CM | POA: Diagnosis not present

## 2018-11-22 DIAGNOSIS — N2581 Secondary hyperparathyroidism of renal origin: Secondary | ICD-10-CM | POA: Diagnosis not present

## 2018-11-22 DIAGNOSIS — D631 Anemia in chronic kidney disease: Secondary | ICD-10-CM | POA: Diagnosis not present

## 2018-11-22 DIAGNOSIS — N186 End stage renal disease: Secondary | ICD-10-CM | POA: Diagnosis not present

## 2018-11-24 DIAGNOSIS — N186 End stage renal disease: Secondary | ICD-10-CM | POA: Diagnosis not present

## 2018-11-24 DIAGNOSIS — N2581 Secondary hyperparathyroidism of renal origin: Secondary | ICD-10-CM | POA: Diagnosis not present

## 2018-11-24 DIAGNOSIS — D631 Anemia in chronic kidney disease: Secondary | ICD-10-CM | POA: Diagnosis not present

## 2018-11-26 DIAGNOSIS — N186 End stage renal disease: Secondary | ICD-10-CM | POA: Diagnosis not present

## 2018-11-26 DIAGNOSIS — D631 Anemia in chronic kidney disease: Secondary | ICD-10-CM | POA: Diagnosis not present

## 2018-11-26 DIAGNOSIS — N2581 Secondary hyperparathyroidism of renal origin: Secondary | ICD-10-CM | POA: Diagnosis not present

## 2018-11-29 DIAGNOSIS — D631 Anemia in chronic kidney disease: Secondary | ICD-10-CM | POA: Diagnosis not present

## 2018-11-29 DIAGNOSIS — N186 End stage renal disease: Secondary | ICD-10-CM | POA: Diagnosis not present

## 2018-11-29 DIAGNOSIS — N2581 Secondary hyperparathyroidism of renal origin: Secondary | ICD-10-CM | POA: Diagnosis not present

## 2018-12-02 DIAGNOSIS — Z79891 Long term (current) use of opiate analgesic: Secondary | ICD-10-CM | POA: Diagnosis not present

## 2018-12-02 DIAGNOSIS — G894 Chronic pain syndrome: Secondary | ICD-10-CM | POA: Diagnosis not present

## 2018-12-02 DIAGNOSIS — M545 Low back pain: Secondary | ICD-10-CM | POA: Diagnosis not present

## 2018-12-03 DIAGNOSIS — D631 Anemia in chronic kidney disease: Secondary | ICD-10-CM | POA: Diagnosis not present

## 2018-12-03 DIAGNOSIS — N2581 Secondary hyperparathyroidism of renal origin: Secondary | ICD-10-CM | POA: Diagnosis not present

## 2018-12-03 DIAGNOSIS — N186 End stage renal disease: Secondary | ICD-10-CM | POA: Diagnosis not present

## 2018-12-04 DIAGNOSIS — Z992 Dependence on renal dialysis: Secondary | ICD-10-CM | POA: Diagnosis not present

## 2018-12-04 DIAGNOSIS — I120 Hypertensive chronic kidney disease with stage 5 chronic kidney disease or end stage renal disease: Secondary | ICD-10-CM | POA: Diagnosis not present

## 2018-12-04 DIAGNOSIS — N186 End stage renal disease: Secondary | ICD-10-CM | POA: Diagnosis not present

## 2018-12-06 DIAGNOSIS — D631 Anemia in chronic kidney disease: Secondary | ICD-10-CM | POA: Diagnosis not present

## 2018-12-06 DIAGNOSIS — Z992 Dependence on renal dialysis: Secondary | ICD-10-CM | POA: Diagnosis not present

## 2018-12-06 DIAGNOSIS — N186 End stage renal disease: Secondary | ICD-10-CM | POA: Diagnosis not present

## 2018-12-06 DIAGNOSIS — N2581 Secondary hyperparathyroidism of renal origin: Secondary | ICD-10-CM | POA: Diagnosis not present

## 2018-12-08 DIAGNOSIS — Z992 Dependence on renal dialysis: Secondary | ICD-10-CM | POA: Diagnosis not present

## 2018-12-08 DIAGNOSIS — N2581 Secondary hyperparathyroidism of renal origin: Secondary | ICD-10-CM | POA: Diagnosis not present

## 2018-12-08 DIAGNOSIS — D631 Anemia in chronic kidney disease: Secondary | ICD-10-CM | POA: Diagnosis not present

## 2018-12-08 DIAGNOSIS — N186 End stage renal disease: Secondary | ICD-10-CM | POA: Diagnosis not present

## 2018-12-10 DIAGNOSIS — N2581 Secondary hyperparathyroidism of renal origin: Secondary | ICD-10-CM | POA: Diagnosis not present

## 2018-12-10 DIAGNOSIS — D631 Anemia in chronic kidney disease: Secondary | ICD-10-CM | POA: Diagnosis not present

## 2018-12-10 DIAGNOSIS — N186 End stage renal disease: Secondary | ICD-10-CM | POA: Diagnosis not present

## 2018-12-10 DIAGNOSIS — Z992 Dependence on renal dialysis: Secondary | ICD-10-CM | POA: Diagnosis not present

## 2018-12-13 DIAGNOSIS — D631 Anemia in chronic kidney disease: Secondary | ICD-10-CM | POA: Diagnosis not present

## 2018-12-13 DIAGNOSIS — Z992 Dependence on renal dialysis: Secondary | ICD-10-CM | POA: Diagnosis not present

## 2018-12-13 DIAGNOSIS — N186 End stage renal disease: Secondary | ICD-10-CM | POA: Diagnosis not present

## 2018-12-13 DIAGNOSIS — N2581 Secondary hyperparathyroidism of renal origin: Secondary | ICD-10-CM | POA: Diagnosis not present

## 2018-12-15 DIAGNOSIS — N186 End stage renal disease: Secondary | ICD-10-CM | POA: Diagnosis not present

## 2018-12-15 DIAGNOSIS — D631 Anemia in chronic kidney disease: Secondary | ICD-10-CM | POA: Diagnosis not present

## 2018-12-15 DIAGNOSIS — Z992 Dependence on renal dialysis: Secondary | ICD-10-CM | POA: Diagnosis not present

## 2018-12-15 DIAGNOSIS — N2581 Secondary hyperparathyroidism of renal origin: Secondary | ICD-10-CM | POA: Diagnosis not present

## 2018-12-17 DIAGNOSIS — D631 Anemia in chronic kidney disease: Secondary | ICD-10-CM | POA: Diagnosis not present

## 2018-12-17 DIAGNOSIS — N186 End stage renal disease: Secondary | ICD-10-CM | POA: Diagnosis not present

## 2018-12-17 DIAGNOSIS — Z992 Dependence on renal dialysis: Secondary | ICD-10-CM | POA: Diagnosis not present

## 2018-12-17 DIAGNOSIS — N2581 Secondary hyperparathyroidism of renal origin: Secondary | ICD-10-CM | POA: Diagnosis not present

## 2018-12-20 DIAGNOSIS — D631 Anemia in chronic kidney disease: Secondary | ICD-10-CM | POA: Diagnosis not present

## 2018-12-20 DIAGNOSIS — N186 End stage renal disease: Secondary | ICD-10-CM | POA: Diagnosis not present

## 2018-12-20 DIAGNOSIS — Z992 Dependence on renal dialysis: Secondary | ICD-10-CM | POA: Diagnosis not present

## 2018-12-20 DIAGNOSIS — N2581 Secondary hyperparathyroidism of renal origin: Secondary | ICD-10-CM | POA: Diagnosis not present

## 2018-12-22 DIAGNOSIS — Z992 Dependence on renal dialysis: Secondary | ICD-10-CM | POA: Diagnosis not present

## 2018-12-22 DIAGNOSIS — N186 End stage renal disease: Secondary | ICD-10-CM | POA: Diagnosis not present

## 2018-12-22 DIAGNOSIS — N2581 Secondary hyperparathyroidism of renal origin: Secondary | ICD-10-CM | POA: Diagnosis not present

## 2018-12-22 DIAGNOSIS — D631 Anemia in chronic kidney disease: Secondary | ICD-10-CM | POA: Diagnosis not present

## 2018-12-24 DIAGNOSIS — Z992 Dependence on renal dialysis: Secondary | ICD-10-CM | POA: Diagnosis not present

## 2018-12-24 DIAGNOSIS — N186 End stage renal disease: Secondary | ICD-10-CM | POA: Diagnosis not present

## 2018-12-24 DIAGNOSIS — D631 Anemia in chronic kidney disease: Secondary | ICD-10-CM | POA: Diagnosis not present

## 2018-12-24 DIAGNOSIS — N2581 Secondary hyperparathyroidism of renal origin: Secondary | ICD-10-CM | POA: Diagnosis not present

## 2018-12-27 DIAGNOSIS — Z992 Dependence on renal dialysis: Secondary | ICD-10-CM | POA: Diagnosis not present

## 2018-12-27 DIAGNOSIS — D631 Anemia in chronic kidney disease: Secondary | ICD-10-CM | POA: Diagnosis not present

## 2018-12-27 DIAGNOSIS — N186 End stage renal disease: Secondary | ICD-10-CM | POA: Diagnosis not present

## 2018-12-27 DIAGNOSIS — N2581 Secondary hyperparathyroidism of renal origin: Secondary | ICD-10-CM | POA: Diagnosis not present

## 2018-12-29 DIAGNOSIS — N186 End stage renal disease: Secondary | ICD-10-CM | POA: Diagnosis not present

## 2018-12-29 DIAGNOSIS — N2581 Secondary hyperparathyroidism of renal origin: Secondary | ICD-10-CM | POA: Diagnosis not present

## 2018-12-29 DIAGNOSIS — Z992 Dependence on renal dialysis: Secondary | ICD-10-CM | POA: Diagnosis not present

## 2018-12-29 DIAGNOSIS — D631 Anemia in chronic kidney disease: Secondary | ICD-10-CM | POA: Diagnosis not present

## 2018-12-31 DIAGNOSIS — N186 End stage renal disease: Secondary | ICD-10-CM | POA: Diagnosis not present

## 2018-12-31 DIAGNOSIS — Z992 Dependence on renal dialysis: Secondary | ICD-10-CM | POA: Diagnosis not present

## 2018-12-31 DIAGNOSIS — D631 Anemia in chronic kidney disease: Secondary | ICD-10-CM | POA: Diagnosis not present

## 2018-12-31 DIAGNOSIS — N2581 Secondary hyperparathyroidism of renal origin: Secondary | ICD-10-CM | POA: Diagnosis not present

## 2019-01-01 DIAGNOSIS — N186 End stage renal disease: Secondary | ICD-10-CM | POA: Diagnosis not present

## 2019-01-01 DIAGNOSIS — D631 Anemia in chronic kidney disease: Secondary | ICD-10-CM | POA: Diagnosis not present

## 2019-01-01 DIAGNOSIS — Z992 Dependence on renal dialysis: Secondary | ICD-10-CM | POA: Diagnosis not present

## 2019-01-01 DIAGNOSIS — N2581 Secondary hyperparathyroidism of renal origin: Secondary | ICD-10-CM | POA: Diagnosis not present

## 2019-01-03 DIAGNOSIS — D631 Anemia in chronic kidney disease: Secondary | ICD-10-CM | POA: Diagnosis not present

## 2019-01-03 DIAGNOSIS — N186 End stage renal disease: Secondary | ICD-10-CM | POA: Diagnosis not present

## 2019-01-03 DIAGNOSIS — N2581 Secondary hyperparathyroidism of renal origin: Secondary | ICD-10-CM | POA: Diagnosis not present

## 2019-01-03 DIAGNOSIS — Z992 Dependence on renal dialysis: Secondary | ICD-10-CM | POA: Diagnosis not present

## 2019-01-04 DIAGNOSIS — N186 End stage renal disease: Secondary | ICD-10-CM | POA: Diagnosis not present

## 2019-01-04 DIAGNOSIS — Z992 Dependence on renal dialysis: Secondary | ICD-10-CM | POA: Diagnosis not present

## 2019-01-04 DIAGNOSIS — I120 Hypertensive chronic kidney disease with stage 5 chronic kidney disease or end stage renal disease: Secondary | ICD-10-CM | POA: Diagnosis not present

## 2019-01-05 DIAGNOSIS — D631 Anemia in chronic kidney disease: Secondary | ICD-10-CM | POA: Diagnosis not present

## 2019-01-05 DIAGNOSIS — Z992 Dependence on renal dialysis: Secondary | ICD-10-CM | POA: Diagnosis not present

## 2019-01-05 DIAGNOSIS — Z23 Encounter for immunization: Secondary | ICD-10-CM | POA: Diagnosis not present

## 2019-01-05 DIAGNOSIS — N186 End stage renal disease: Secondary | ICD-10-CM | POA: Diagnosis not present

## 2019-01-05 DIAGNOSIS — N2581 Secondary hyperparathyroidism of renal origin: Secondary | ICD-10-CM | POA: Diagnosis not present

## 2019-01-07 DIAGNOSIS — Z23 Encounter for immunization: Secondary | ICD-10-CM | POA: Diagnosis not present

## 2019-01-07 DIAGNOSIS — D631 Anemia in chronic kidney disease: Secondary | ICD-10-CM | POA: Diagnosis not present

## 2019-01-07 DIAGNOSIS — N186 End stage renal disease: Secondary | ICD-10-CM | POA: Diagnosis not present

## 2019-01-07 DIAGNOSIS — Z992 Dependence on renal dialysis: Secondary | ICD-10-CM | POA: Diagnosis not present

## 2019-01-07 DIAGNOSIS — N2581 Secondary hyperparathyroidism of renal origin: Secondary | ICD-10-CM | POA: Diagnosis not present

## 2019-01-10 DIAGNOSIS — D631 Anemia in chronic kidney disease: Secondary | ICD-10-CM | POA: Diagnosis not present

## 2019-01-10 DIAGNOSIS — N2581 Secondary hyperparathyroidism of renal origin: Secondary | ICD-10-CM | POA: Diagnosis not present

## 2019-01-10 DIAGNOSIS — Z23 Encounter for immunization: Secondary | ICD-10-CM | POA: Diagnosis not present

## 2019-01-10 DIAGNOSIS — N186 End stage renal disease: Secondary | ICD-10-CM | POA: Diagnosis not present

## 2019-01-10 DIAGNOSIS — Z992 Dependence on renal dialysis: Secondary | ICD-10-CM | POA: Diagnosis not present

## 2019-01-12 DIAGNOSIS — N186 End stage renal disease: Secondary | ICD-10-CM | POA: Diagnosis not present

## 2019-01-12 DIAGNOSIS — N2581 Secondary hyperparathyroidism of renal origin: Secondary | ICD-10-CM | POA: Diagnosis not present

## 2019-01-12 DIAGNOSIS — Z992 Dependence on renal dialysis: Secondary | ICD-10-CM | POA: Diagnosis not present

## 2019-01-12 DIAGNOSIS — Z23 Encounter for immunization: Secondary | ICD-10-CM | POA: Diagnosis not present

## 2019-01-12 DIAGNOSIS — D631 Anemia in chronic kidney disease: Secondary | ICD-10-CM | POA: Diagnosis not present

## 2019-01-14 DIAGNOSIS — N186 End stage renal disease: Secondary | ICD-10-CM | POA: Diagnosis not present

## 2019-01-14 DIAGNOSIS — D631 Anemia in chronic kidney disease: Secondary | ICD-10-CM | POA: Diagnosis not present

## 2019-01-14 DIAGNOSIS — N2581 Secondary hyperparathyroidism of renal origin: Secondary | ICD-10-CM | POA: Diagnosis not present

## 2019-01-14 DIAGNOSIS — Z23 Encounter for immunization: Secondary | ICD-10-CM | POA: Diagnosis not present

## 2019-01-14 DIAGNOSIS — Z992 Dependence on renal dialysis: Secondary | ICD-10-CM | POA: Diagnosis not present

## 2019-01-17 DIAGNOSIS — D631 Anemia in chronic kidney disease: Secondary | ICD-10-CM | POA: Diagnosis not present

## 2019-01-17 DIAGNOSIS — Z23 Encounter for immunization: Secondary | ICD-10-CM | POA: Diagnosis not present

## 2019-01-17 DIAGNOSIS — N2581 Secondary hyperparathyroidism of renal origin: Secondary | ICD-10-CM | POA: Diagnosis not present

## 2019-01-17 DIAGNOSIS — Z992 Dependence on renal dialysis: Secondary | ICD-10-CM | POA: Diagnosis not present

## 2019-01-17 DIAGNOSIS — N186 End stage renal disease: Secondary | ICD-10-CM | POA: Diagnosis not present

## 2019-01-19 DIAGNOSIS — N2581 Secondary hyperparathyroidism of renal origin: Secondary | ICD-10-CM | POA: Diagnosis not present

## 2019-01-19 DIAGNOSIS — Z23 Encounter for immunization: Secondary | ICD-10-CM | POA: Diagnosis not present

## 2019-01-19 DIAGNOSIS — N186 End stage renal disease: Secondary | ICD-10-CM | POA: Diagnosis not present

## 2019-01-19 DIAGNOSIS — D631 Anemia in chronic kidney disease: Secondary | ICD-10-CM | POA: Diagnosis not present

## 2019-01-19 DIAGNOSIS — Z992 Dependence on renal dialysis: Secondary | ICD-10-CM | POA: Diagnosis not present

## 2019-01-20 ENCOUNTER — Ambulatory Visit: Payer: Medicare Other

## 2019-01-20 ENCOUNTER — Ambulatory Visit (HOSPITAL_COMMUNITY): Payer: Medicare Other

## 2019-01-21 DIAGNOSIS — Z23 Encounter for immunization: Secondary | ICD-10-CM | POA: Diagnosis not present

## 2019-01-21 DIAGNOSIS — N2581 Secondary hyperparathyroidism of renal origin: Secondary | ICD-10-CM | POA: Diagnosis not present

## 2019-01-21 DIAGNOSIS — N186 End stage renal disease: Secondary | ICD-10-CM | POA: Diagnosis not present

## 2019-01-21 DIAGNOSIS — Z992 Dependence on renal dialysis: Secondary | ICD-10-CM | POA: Diagnosis not present

## 2019-01-21 DIAGNOSIS — D631 Anemia in chronic kidney disease: Secondary | ICD-10-CM | POA: Diagnosis not present

## 2019-01-24 DIAGNOSIS — D631 Anemia in chronic kidney disease: Secondary | ICD-10-CM | POA: Diagnosis not present

## 2019-01-24 DIAGNOSIS — Z992 Dependence on renal dialysis: Secondary | ICD-10-CM | POA: Diagnosis not present

## 2019-01-24 DIAGNOSIS — Z23 Encounter for immunization: Secondary | ICD-10-CM | POA: Diagnosis not present

## 2019-01-24 DIAGNOSIS — N2581 Secondary hyperparathyroidism of renal origin: Secondary | ICD-10-CM | POA: Diagnosis not present

## 2019-01-24 DIAGNOSIS — N186 End stage renal disease: Secondary | ICD-10-CM | POA: Diagnosis not present

## 2019-01-25 ENCOUNTER — Ambulatory Visit: Payer: Self-pay | Admitting: Cardiology

## 2019-01-26 DIAGNOSIS — D631 Anemia in chronic kidney disease: Secondary | ICD-10-CM | POA: Diagnosis not present

## 2019-01-26 DIAGNOSIS — N186 End stage renal disease: Secondary | ICD-10-CM | POA: Diagnosis not present

## 2019-01-26 DIAGNOSIS — Z23 Encounter for immunization: Secondary | ICD-10-CM | POA: Diagnosis not present

## 2019-01-26 DIAGNOSIS — Z992 Dependence on renal dialysis: Secondary | ICD-10-CM | POA: Diagnosis not present

## 2019-01-26 DIAGNOSIS — N2581 Secondary hyperparathyroidism of renal origin: Secondary | ICD-10-CM | POA: Diagnosis not present

## 2019-01-28 DIAGNOSIS — Z23 Encounter for immunization: Secondary | ICD-10-CM | POA: Diagnosis not present

## 2019-01-28 DIAGNOSIS — N186 End stage renal disease: Secondary | ICD-10-CM | POA: Diagnosis not present

## 2019-01-28 DIAGNOSIS — D631 Anemia in chronic kidney disease: Secondary | ICD-10-CM | POA: Diagnosis not present

## 2019-01-28 DIAGNOSIS — Z992 Dependence on renal dialysis: Secondary | ICD-10-CM | POA: Diagnosis not present

## 2019-01-28 DIAGNOSIS — N2581 Secondary hyperparathyroidism of renal origin: Secondary | ICD-10-CM | POA: Diagnosis not present

## 2019-01-31 DIAGNOSIS — Z23 Encounter for immunization: Secondary | ICD-10-CM | POA: Diagnosis not present

## 2019-01-31 DIAGNOSIS — N186 End stage renal disease: Secondary | ICD-10-CM | POA: Diagnosis not present

## 2019-01-31 DIAGNOSIS — N2581 Secondary hyperparathyroidism of renal origin: Secondary | ICD-10-CM | POA: Diagnosis not present

## 2019-01-31 DIAGNOSIS — D631 Anemia in chronic kidney disease: Secondary | ICD-10-CM | POA: Diagnosis not present

## 2019-01-31 DIAGNOSIS — Z992 Dependence on renal dialysis: Secondary | ICD-10-CM | POA: Diagnosis not present

## 2019-02-01 ENCOUNTER — Other Ambulatory Visit: Payer: Self-pay

## 2019-02-01 DIAGNOSIS — Z992 Dependence on renal dialysis: Secondary | ICD-10-CM

## 2019-02-01 DIAGNOSIS — N186 End stage renal disease: Secondary | ICD-10-CM

## 2019-02-02 DIAGNOSIS — Z23 Encounter for immunization: Secondary | ICD-10-CM | POA: Diagnosis not present

## 2019-02-02 DIAGNOSIS — Z992 Dependence on renal dialysis: Secondary | ICD-10-CM | POA: Diagnosis not present

## 2019-02-02 DIAGNOSIS — N2581 Secondary hyperparathyroidism of renal origin: Secondary | ICD-10-CM | POA: Diagnosis not present

## 2019-02-02 DIAGNOSIS — D631 Anemia in chronic kidney disease: Secondary | ICD-10-CM | POA: Diagnosis not present

## 2019-02-02 DIAGNOSIS — N186 End stage renal disease: Secondary | ICD-10-CM | POA: Diagnosis not present

## 2019-02-03 ENCOUNTER — Ambulatory Visit (INDEPENDENT_AMBULATORY_CARE_PROVIDER_SITE_OTHER): Payer: Medicare Other | Admitting: Physician Assistant

## 2019-02-03 ENCOUNTER — Other Ambulatory Visit: Payer: Self-pay

## 2019-02-03 ENCOUNTER — Ambulatory Visit (HOSPITAL_COMMUNITY)
Admission: RE | Admit: 2019-02-03 | Discharge: 2019-02-03 | Disposition: A | Discharge status: Home / Self Care | Payer: Medicare Other | Source: Ambulatory Visit | Attending: Family | Admitting: Family

## 2019-02-03 VITALS — BP 120/80 | HR 78 | Temp 97.9°F | Resp 14 | Ht 72.0 in | Wt 177.4 lb

## 2019-02-03 DIAGNOSIS — D631 Anemia in chronic kidney disease: Secondary | ICD-10-CM | POA: Diagnosis not present

## 2019-02-03 DIAGNOSIS — N186 End stage renal disease: Secondary | ICD-10-CM | POA: Insufficient documentation

## 2019-02-03 DIAGNOSIS — N2581 Secondary hyperparathyroidism of renal origin: Secondary | ICD-10-CM | POA: Diagnosis not present

## 2019-02-03 DIAGNOSIS — Z992 Dependence on renal dialysis: Secondary | ICD-10-CM | POA: Diagnosis not present

## 2019-02-03 DIAGNOSIS — I120 Hypertensive chronic kidney disease with stage 5 chronic kidney disease or end stage renal disease: Secondary | ICD-10-CM | POA: Diagnosis not present

## 2019-02-03 NOTE — Progress Notes (Signed)
Established Dialysis Access   History of Present Illness   Gregory [name] is a 68 y.o. (20-Apr-1951) male who presents for re-evaluation of permanent access.  Patient is dialyzing from right arm brachiocephalic fistula on a Monday Wednesday Friday schedule without complication.  Surgical history significant for a plication procedure in 2014 and again in 2016.  He denies any skin breakdown, bleeding episodes, or ulcers overlying fistula.     Surgical history also significant for aortobiiliac stent grafting with a iliac branch device to the right side and coil embolization of the left internal iliac artery.  He is scheduled for EVAR duplex next month.  He denies any new or changing abdominal or back pain.  He also denies any rest pain or active tissue ischemia of bilateral lower extremities.  Past medical history also significant for asymptomatic celiac and SMA aneurysms.  The patient's PMH, PSH, SH, and FamHx were reviewed and are unchanged from prior visit.  Current Outpatient Medications  Medication Sig Dispense Refill   atorvastatin (LIPITOR) 40 MG tablet Take 40 mg by mouth daily.     B Complex-C-Folic Acid (DIALYVITE 099 PO) Take 1 tablet by mouth every Monday, Wednesday, and Friday with hemodialysis. After dialysis.     calcium carbonate (OS-CAL) 1250 (500 Ca) MG chewable tablet Chew 2 tablets by mouth at bedtime.     cinacalcet (SENSIPAR) 60 MG tablet Take 60 mg by mouth daily after supper.     colchicine 0.6 MG tablet Take 0.6 mg by mouth daily as needed (for gout flare ups.).   0   oxyCODONE-acetaminophen (PERCOCET) 10-325 MG tablet Take 1 tablet by mouth 4 (four) times daily.      predniSONE (DELTASONE) 5 MG tablet Take 5 mg by mouth daily.   0   promethazine (PHENERGAN) 25 MG tablet Take 25 mg by mouth every 6 (six) hours as needed for nausea or vomiting.     sucroferric oxyhydroxide (VELPHORO) 500 MG chewable tablet Chew 250 mg by mouth 3 (three) times daily with meals.       No current facility-administered medications for this visit.     On ROS today: 10 system ROS is negative unless otherwise noted in HPI   Physical Examination   Vitals:   02/03/19 1556  BP: 120/80  Pulse: 78  Resp: 14  Temp: 97.9 F (36.6 C)  TempSrc: Temporal  SpO2: 100%  Weight: 177 lb 6.4 oz (80.5 kg)  Height: 6' (1.829 m)   Body mass index is 24.06 kg/m.  General Alert, O x 3, WD, NAD  Pulmonary Sym exp, good B air movt,   Cardiac RRR, Nl S1, S2,   Vascular Vessel Right Left  Radial Palpable Palpable  Brachial Palpable Palpable  Ulnar Not palpable Not palpable    Musculo- skeletal  palpable thrill through right arm AV fistula ; 3 areas of aneurysmal changes; able to pinch skin overlying all 3 aneurysms, no breakdown in skin or ulcerations noted extremities without ischemic changes    Neurologic A&O; CN grossly intact          Medical Decision Making   Gregory [name] is a 68 y.o. male who presents with ESRD requiring hemodialysis.    Patent right arm fistula with palpable thrill throughout  3 aneurysmal areas of fistula however good skin integrity without evidence of skin breakdown, ulceration, or any risk for spontaneous bleeding  Patient has also had 2 prior plication surgeries in the past and thus we would not  proceed with additional plication unless patient has bleeding episodes or is at high risk for spontaneous bleeding with overlying ulcer  I also explained to the patient that with every plication surgery there is risk that fistula can be made worse which would require conversion to AV graft  Patient will follow-up next month for annual EVAR duplex  This patient was also evaluated by Dr. Oneida Alar who agrees with the above plan and would not offer plication unless there was overlying ulcer at risk for spontaneous bleeding   Dagoberto Ligas PA-C [facility] Office: [phone]

## 2019-02-04 DIAGNOSIS — D631 Anemia in chronic kidney disease: Secondary | ICD-10-CM | POA: Diagnosis not present

## 2019-02-04 DIAGNOSIS — N186 End stage renal disease: Secondary | ICD-10-CM | POA: Diagnosis not present

## 2019-02-04 DIAGNOSIS — N2581 Secondary hyperparathyroidism of renal origin: Secondary | ICD-10-CM | POA: Diagnosis not present

## 2019-02-04 DIAGNOSIS — Z992 Dependence on renal dialysis: Secondary | ICD-10-CM | POA: Diagnosis not present

## 2019-02-07 DIAGNOSIS — Z992 Dependence on renal dialysis: Secondary | ICD-10-CM | POA: Diagnosis not present

## 2019-02-07 DIAGNOSIS — N186 End stage renal disease: Secondary | ICD-10-CM | POA: Diagnosis not present

## 2019-02-07 DIAGNOSIS — D631 Anemia in chronic kidney disease: Secondary | ICD-10-CM | POA: Diagnosis not present

## 2019-02-07 DIAGNOSIS — N2581 Secondary hyperparathyroidism of renal origin: Secondary | ICD-10-CM | POA: Diagnosis not present

## 2019-02-08 ENCOUNTER — Other Ambulatory Visit (HOSPITAL_COMMUNITY): Payer: Medicare Other

## 2019-02-09 DIAGNOSIS — N186 End stage renal disease: Secondary | ICD-10-CM | POA: Diagnosis not present

## 2019-02-09 DIAGNOSIS — Z992 Dependence on renal dialysis: Secondary | ICD-10-CM | POA: Diagnosis not present

## 2019-02-09 DIAGNOSIS — D631 Anemia in chronic kidney disease: Secondary | ICD-10-CM | POA: Diagnosis not present

## 2019-02-09 DIAGNOSIS — N2581 Secondary hyperparathyroidism of renal origin: Secondary | ICD-10-CM | POA: Diagnosis not present

## 2019-02-11 DIAGNOSIS — Z992 Dependence on renal dialysis: Secondary | ICD-10-CM | POA: Diagnosis not present

## 2019-02-11 DIAGNOSIS — N2581 Secondary hyperparathyroidism of renal origin: Secondary | ICD-10-CM | POA: Diagnosis not present

## 2019-02-11 DIAGNOSIS — D631 Anemia in chronic kidney disease: Secondary | ICD-10-CM | POA: Diagnosis not present

## 2019-02-11 DIAGNOSIS — N186 End stage renal disease: Secondary | ICD-10-CM | POA: Diagnosis not present

## 2019-02-14 DIAGNOSIS — N186 End stage renal disease: Secondary | ICD-10-CM | POA: Diagnosis not present

## 2019-02-14 DIAGNOSIS — D631 Anemia in chronic kidney disease: Secondary | ICD-10-CM | POA: Diagnosis not present

## 2019-02-14 DIAGNOSIS — Z992 Dependence on renal dialysis: Secondary | ICD-10-CM | POA: Diagnosis not present

## 2019-02-14 DIAGNOSIS — N2581 Secondary hyperparathyroidism of renal origin: Secondary | ICD-10-CM | POA: Diagnosis not present

## 2019-02-16 DIAGNOSIS — D631 Anemia in chronic kidney disease: Secondary | ICD-10-CM | POA: Diagnosis not present

## 2019-02-16 DIAGNOSIS — N2581 Secondary hyperparathyroidism of renal origin: Secondary | ICD-10-CM | POA: Diagnosis not present

## 2019-02-16 DIAGNOSIS — N186 End stage renal disease: Secondary | ICD-10-CM | POA: Diagnosis not present

## 2019-02-16 DIAGNOSIS — Z992 Dependence on renal dialysis: Secondary | ICD-10-CM | POA: Diagnosis not present

## 2019-02-18 DIAGNOSIS — N186 End stage renal disease: Secondary | ICD-10-CM | POA: Diagnosis not present

## 2019-02-18 DIAGNOSIS — Z992 Dependence on renal dialysis: Secondary | ICD-10-CM | POA: Diagnosis not present

## 2019-02-18 DIAGNOSIS — D631 Anemia in chronic kidney disease: Secondary | ICD-10-CM | POA: Diagnosis not present

## 2019-02-18 DIAGNOSIS — N2581 Secondary hyperparathyroidism of renal origin: Secondary | ICD-10-CM | POA: Diagnosis not present

## 2019-02-21 DIAGNOSIS — N2581 Secondary hyperparathyroidism of renal origin: Secondary | ICD-10-CM | POA: Diagnosis not present

## 2019-02-21 DIAGNOSIS — Z992 Dependence on renal dialysis: Secondary | ICD-10-CM | POA: Diagnosis not present

## 2019-02-21 DIAGNOSIS — N186 End stage renal disease: Secondary | ICD-10-CM | POA: Diagnosis not present

## 2019-02-21 DIAGNOSIS — D631 Anemia in chronic kidney disease: Secondary | ICD-10-CM | POA: Diagnosis not present

## 2019-02-23 DIAGNOSIS — N2581 Secondary hyperparathyroidism of renal origin: Secondary | ICD-10-CM | POA: Diagnosis not present

## 2019-02-23 DIAGNOSIS — Z992 Dependence on renal dialysis: Secondary | ICD-10-CM | POA: Diagnosis not present

## 2019-02-23 DIAGNOSIS — D631 Anemia in chronic kidney disease: Secondary | ICD-10-CM | POA: Diagnosis not present

## 2019-02-23 DIAGNOSIS — N186 End stage renal disease: Secondary | ICD-10-CM | POA: Diagnosis not present

## 2019-02-25 DIAGNOSIS — N186 End stage renal disease: Secondary | ICD-10-CM | POA: Diagnosis not present

## 2019-02-25 DIAGNOSIS — Z992 Dependence on renal dialysis: Secondary | ICD-10-CM | POA: Diagnosis not present

## 2019-02-25 DIAGNOSIS — D631 Anemia in chronic kidney disease: Secondary | ICD-10-CM | POA: Diagnosis not present

## 2019-02-25 DIAGNOSIS — N2581 Secondary hyperparathyroidism of renal origin: Secondary | ICD-10-CM | POA: Diagnosis not present

## 2019-02-28 DIAGNOSIS — N2581 Secondary hyperparathyroidism of renal origin: Secondary | ICD-10-CM | POA: Diagnosis not present

## 2019-02-28 DIAGNOSIS — N186 End stage renal disease: Secondary | ICD-10-CM | POA: Diagnosis not present

## 2019-02-28 DIAGNOSIS — D631 Anemia in chronic kidney disease: Secondary | ICD-10-CM | POA: Diagnosis not present

## 2019-02-28 DIAGNOSIS — Z992 Dependence on renal dialysis: Secondary | ICD-10-CM | POA: Diagnosis not present

## 2019-03-02 DIAGNOSIS — Z992 Dependence on renal dialysis: Secondary | ICD-10-CM | POA: Diagnosis not present

## 2019-03-02 DIAGNOSIS — N2581 Secondary hyperparathyroidism of renal origin: Secondary | ICD-10-CM | POA: Diagnosis not present

## 2019-03-02 DIAGNOSIS — D631 Anemia in chronic kidney disease: Secondary | ICD-10-CM | POA: Diagnosis not present

## 2019-03-02 DIAGNOSIS — N186 End stage renal disease: Secondary | ICD-10-CM | POA: Diagnosis not present

## 2019-03-03 ENCOUNTER — Ambulatory Visit: Payer: Self-pay | Admitting: Cardiology

## 2019-03-04 DIAGNOSIS — N186 End stage renal disease: Secondary | ICD-10-CM | POA: Diagnosis not present

## 2019-03-04 DIAGNOSIS — D631 Anemia in chronic kidney disease: Secondary | ICD-10-CM | POA: Diagnosis not present

## 2019-03-04 DIAGNOSIS — N2581 Secondary hyperparathyroidism of renal origin: Secondary | ICD-10-CM | POA: Diagnosis not present

## 2019-03-04 DIAGNOSIS — Z992 Dependence on renal dialysis: Secondary | ICD-10-CM | POA: Diagnosis not present

## 2019-03-06 DIAGNOSIS — Z992 Dependence on renal dialysis: Secondary | ICD-10-CM | POA: Diagnosis not present

## 2019-03-06 DIAGNOSIS — N186 End stage renal disease: Secondary | ICD-10-CM | POA: Diagnosis not present

## 2019-03-06 DIAGNOSIS — I120 Hypertensive chronic kidney disease with stage 5 chronic kidney disease or end stage renal disease: Secondary | ICD-10-CM | POA: Diagnosis not present

## 2019-03-07 ENCOUNTER — Other Ambulatory Visit: Payer: Self-pay

## 2019-03-07 DIAGNOSIS — I723 Aneurysm of iliac artery: Secondary | ICD-10-CM

## 2019-03-07 DIAGNOSIS — N186 End stage renal disease: Secondary | ICD-10-CM | POA: Diagnosis not present

## 2019-03-07 DIAGNOSIS — N2581 Secondary hyperparathyroidism of renal origin: Secondary | ICD-10-CM | POA: Diagnosis not present

## 2019-03-07 DIAGNOSIS — D631 Anemia in chronic kidney disease: Secondary | ICD-10-CM | POA: Diagnosis not present

## 2019-03-07 DIAGNOSIS — Z992 Dependence on renal dialysis: Secondary | ICD-10-CM | POA: Diagnosis not present

## 2019-03-09 DIAGNOSIS — N2581 Secondary hyperparathyroidism of renal origin: Secondary | ICD-10-CM | POA: Diagnosis not present

## 2019-03-09 DIAGNOSIS — N186 End stage renal disease: Secondary | ICD-10-CM | POA: Diagnosis not present

## 2019-03-09 DIAGNOSIS — D631 Anemia in chronic kidney disease: Secondary | ICD-10-CM | POA: Diagnosis not present

## 2019-03-09 DIAGNOSIS — Z992 Dependence on renal dialysis: Secondary | ICD-10-CM | POA: Diagnosis not present

## 2019-03-10 DIAGNOSIS — Z79891 Long term (current) use of opiate analgesic: Secondary | ICD-10-CM | POA: Diagnosis not present

## 2019-03-10 DIAGNOSIS — M545 Low back pain: Secondary | ICD-10-CM | POA: Diagnosis not present

## 2019-03-10 DIAGNOSIS — M5136 Other intervertebral disc degeneration, lumbar region: Secondary | ICD-10-CM | POA: Diagnosis not present

## 2019-03-11 DIAGNOSIS — D631 Anemia in chronic kidney disease: Secondary | ICD-10-CM | POA: Diagnosis not present

## 2019-03-11 DIAGNOSIS — Z992 Dependence on renal dialysis: Secondary | ICD-10-CM | POA: Diagnosis not present

## 2019-03-11 DIAGNOSIS — N2581 Secondary hyperparathyroidism of renal origin: Secondary | ICD-10-CM | POA: Diagnosis not present

## 2019-03-11 DIAGNOSIS — N186 End stage renal disease: Secondary | ICD-10-CM | POA: Diagnosis not present

## 2019-03-14 DIAGNOSIS — Z992 Dependence on renal dialysis: Secondary | ICD-10-CM | POA: Diagnosis not present

## 2019-03-14 DIAGNOSIS — N2581 Secondary hyperparathyroidism of renal origin: Secondary | ICD-10-CM | POA: Diagnosis not present

## 2019-03-14 DIAGNOSIS — D631 Anemia in chronic kidney disease: Secondary | ICD-10-CM | POA: Diagnosis not present

## 2019-03-14 DIAGNOSIS — N186 End stage renal disease: Secondary | ICD-10-CM | POA: Diagnosis not present

## 2019-03-15 ENCOUNTER — Ambulatory Visit (HOSPITAL_COMMUNITY)
Admission: RE | Admit: 2019-03-15 | Discharge: 2019-03-15 | Disposition: A | Discharge status: Home / Self Care | Payer: Medicare Other | Source: Ambulatory Visit | Attending: Family | Admitting: Family

## 2019-03-15 ENCOUNTER — Other Ambulatory Visit: Payer: Self-pay

## 2019-03-15 DIAGNOSIS — I723 Aneurysm of iliac artery: Secondary | ICD-10-CM | POA: Insufficient documentation

## 2019-03-16 DIAGNOSIS — D631 Anemia in chronic kidney disease: Secondary | ICD-10-CM | POA: Diagnosis not present

## 2019-03-16 DIAGNOSIS — N2581 Secondary hyperparathyroidism of renal origin: Secondary | ICD-10-CM | POA: Diagnosis not present

## 2019-03-16 DIAGNOSIS — N186 End stage renal disease: Secondary | ICD-10-CM | POA: Diagnosis not present

## 2019-03-16 DIAGNOSIS — Z992 Dependence on renal dialysis: Secondary | ICD-10-CM | POA: Diagnosis not present

## 2019-03-18 DIAGNOSIS — D631 Anemia in chronic kidney disease: Secondary | ICD-10-CM | POA: Diagnosis not present

## 2019-03-18 DIAGNOSIS — N186 End stage renal disease: Secondary | ICD-10-CM | POA: Diagnosis not present

## 2019-03-18 DIAGNOSIS — N2581 Secondary hyperparathyroidism of renal origin: Secondary | ICD-10-CM | POA: Diagnosis not present

## 2019-03-18 DIAGNOSIS — Z992 Dependence on renal dialysis: Secondary | ICD-10-CM | POA: Diagnosis not present

## 2019-03-21 DIAGNOSIS — N2581 Secondary hyperparathyroidism of renal origin: Secondary | ICD-10-CM | POA: Diagnosis not present

## 2019-03-21 DIAGNOSIS — Z992 Dependence on renal dialysis: Secondary | ICD-10-CM | POA: Diagnosis not present

## 2019-03-21 DIAGNOSIS — D631 Anemia in chronic kidney disease: Secondary | ICD-10-CM | POA: Diagnosis not present

## 2019-03-21 DIAGNOSIS — N186 End stage renal disease: Secondary | ICD-10-CM | POA: Diagnosis not present

## 2019-03-23 DIAGNOSIS — D631 Anemia in chronic kidney disease: Secondary | ICD-10-CM | POA: Diagnosis not present

## 2019-03-23 DIAGNOSIS — N2581 Secondary hyperparathyroidism of renal origin: Secondary | ICD-10-CM | POA: Diagnosis not present

## 2019-03-23 DIAGNOSIS — Z992 Dependence on renal dialysis: Secondary | ICD-10-CM | POA: Diagnosis not present

## 2019-03-23 DIAGNOSIS — N186 End stage renal disease: Secondary | ICD-10-CM | POA: Diagnosis not present

## 2019-03-25 DIAGNOSIS — D631 Anemia in chronic kidney disease: Secondary | ICD-10-CM | POA: Diagnosis not present

## 2019-03-25 DIAGNOSIS — Z992 Dependence on renal dialysis: Secondary | ICD-10-CM | POA: Diagnosis not present

## 2019-03-25 DIAGNOSIS — N2581 Secondary hyperparathyroidism of renal origin: Secondary | ICD-10-CM | POA: Diagnosis not present

## 2019-03-25 DIAGNOSIS — N186 End stage renal disease: Secondary | ICD-10-CM | POA: Diagnosis not present

## 2019-03-27 DIAGNOSIS — N2581 Secondary hyperparathyroidism of renal origin: Secondary | ICD-10-CM | POA: Diagnosis not present

## 2019-03-27 DIAGNOSIS — N186 End stage renal disease: Secondary | ICD-10-CM | POA: Diagnosis not present

## 2019-03-27 DIAGNOSIS — Z992 Dependence on renal dialysis: Secondary | ICD-10-CM | POA: Diagnosis not present

## 2019-03-27 DIAGNOSIS — D631 Anemia in chronic kidney disease: Secondary | ICD-10-CM | POA: Diagnosis not present

## 2019-03-29 DIAGNOSIS — D631 Anemia in chronic kidney disease: Secondary | ICD-10-CM | POA: Diagnosis not present

## 2019-03-29 DIAGNOSIS — Z992 Dependence on renal dialysis: Secondary | ICD-10-CM | POA: Diagnosis not present

## 2019-03-29 DIAGNOSIS — N2581 Secondary hyperparathyroidism of renal origin: Secondary | ICD-10-CM | POA: Diagnosis not present

## 2019-03-29 DIAGNOSIS — N186 End stage renal disease: Secondary | ICD-10-CM | POA: Diagnosis not present

## 2019-04-01 DIAGNOSIS — Z992 Dependence on renal dialysis: Secondary | ICD-10-CM | POA: Diagnosis not present

## 2019-04-01 DIAGNOSIS — D631 Anemia in chronic kidney disease: Secondary | ICD-10-CM | POA: Diagnosis not present

## 2019-04-01 DIAGNOSIS — N2581 Secondary hyperparathyroidism of renal origin: Secondary | ICD-10-CM | POA: Diagnosis not present

## 2019-04-01 DIAGNOSIS — N186 End stage renal disease: Secondary | ICD-10-CM | POA: Diagnosis not present

## 2019-04-04 DIAGNOSIS — D631 Anemia in chronic kidney disease: Secondary | ICD-10-CM | POA: Diagnosis not present

## 2019-04-04 DIAGNOSIS — N2581 Secondary hyperparathyroidism of renal origin: Secondary | ICD-10-CM | POA: Diagnosis not present

## 2019-04-04 DIAGNOSIS — N186 End stage renal disease: Secondary | ICD-10-CM | POA: Diagnosis not present

## 2019-04-04 DIAGNOSIS — Z992 Dependence on renal dialysis: Secondary | ICD-10-CM | POA: Diagnosis not present

## 2019-05-12 ENCOUNTER — Other Ambulatory Visit: Payer: Self-pay | Admitting: Nephrology

## 2019-05-12 DIAGNOSIS — N281 Cyst of kidney, acquired: Secondary | ICD-10-CM

## 2019-05-24 ENCOUNTER — Ambulatory Visit
Admission: RE | Admit: 2019-05-24 | Discharge: 2019-05-24 | Disposition: A | Discharge status: Home / Self Care | Payer: Medicare Other | Source: Ambulatory Visit | Attending: Nephrology | Admitting: Nephrology

## 2019-05-24 DIAGNOSIS — N281 Cyst of kidney, acquired: Secondary | ICD-10-CM

## 2019-05-24 MED ORDER — IOPAMIDOL (ISOVUE-300) INJECTION 61%
100.0000 mL | Freq: Once | INTRAVENOUS | Status: AC | PRN
  Administered 2019-05-24: 100 mL via INTRAVENOUS

## 2019-06-18 ENCOUNTER — Inpatient Hospital Stay (HOSPITAL_COMMUNITY)
Admission: EM | Admit: 2019-06-18 | Discharge: 2019-08-04 | DRG: 871 | Disposition: E | Payer: Medicare Other | Attending: Pulmonary Disease | Admitting: Pulmonary Disease

## 2019-06-18 DIAGNOSIS — B952 Enterococcus as the cause of diseases classified elsewhere: Secondary | ICD-10-CM | POA: Diagnosis present

## 2019-06-18 DIAGNOSIS — Z789 Other specified health status: Secondary | ICD-10-CM

## 2019-06-18 DIAGNOSIS — J9601 Acute respiratory failure with hypoxia: Secondary | ICD-10-CM | POA: Diagnosis present

## 2019-06-18 DIAGNOSIS — Z452 Encounter for adjustment and management of vascular access device: Secondary | ICD-10-CM

## 2019-06-18 DIAGNOSIS — I471 Supraventricular tachycardia: Secondary | ICD-10-CM | POA: Diagnosis present

## 2019-06-18 DIAGNOSIS — E875 Hyperkalemia: Secondary | ICD-10-CM | POA: Diagnosis present

## 2019-06-18 DIAGNOSIS — E2740 Unspecified adrenocortical insufficiency: Secondary | ICD-10-CM | POA: Diagnosis present

## 2019-06-18 DIAGNOSIS — Z833 Family history of diabetes mellitus: Secondary | ICD-10-CM

## 2019-06-18 DIAGNOSIS — N3001 Acute cystitis with hematuria: Secondary | ICD-10-CM | POA: Diagnosis present

## 2019-06-18 DIAGNOSIS — Z978 Presence of other specified devices: Secondary | ICD-10-CM

## 2019-06-18 DIAGNOSIS — E43 Unspecified severe protein-calorie malnutrition: Secondary | ICD-10-CM | POA: Insufficient documentation

## 2019-06-18 DIAGNOSIS — S225XXA Flail chest, initial encounter for closed fracture: Secondary | ICD-10-CM | POA: Diagnosis not present

## 2019-06-18 DIAGNOSIS — N2581 Secondary hyperparathyroidism of renal origin: Secondary | ICD-10-CM | POA: Diagnosis present

## 2019-06-18 DIAGNOSIS — R64 Cachexia: Secondary | ICD-10-CM | POA: Diagnosis present

## 2019-06-18 DIAGNOSIS — Y92239 Unspecified place in hospital as the place of occurrence of the external cause: Secondary | ICD-10-CM | POA: Diagnosis not present

## 2019-06-18 DIAGNOSIS — A498 Other bacterial infections of unspecified site: Secondary | ICD-10-CM

## 2019-06-18 DIAGNOSIS — N261 Atrophy of kidney (terminal): Secondary | ICD-10-CM | POA: Diagnosis present

## 2019-06-18 DIAGNOSIS — Z79899 Other long term (current) drug therapy: Secondary | ICD-10-CM

## 2019-06-18 DIAGNOSIS — N179 Acute kidney failure, unspecified: Secondary | ICD-10-CM

## 2019-06-18 DIAGNOSIS — G9341 Metabolic encephalopathy: Secondary | ICD-10-CM | POA: Diagnosis present

## 2019-06-18 DIAGNOSIS — Z66 Do not resuscitate: Secondary | ICD-10-CM | POA: Diagnosis not present

## 2019-06-18 DIAGNOSIS — E860 Dehydration: Secondary | ICD-10-CM | POA: Diagnosis present

## 2019-06-18 DIAGNOSIS — N186 End stage renal disease: Secondary | ICD-10-CM

## 2019-06-18 DIAGNOSIS — R410 Disorientation, unspecified: Secondary | ICD-10-CM | POA: Diagnosis not present

## 2019-06-18 DIAGNOSIS — K8020 Calculus of gallbladder without cholecystitis without obstruction: Secondary | ICD-10-CM | POA: Diagnosis present

## 2019-06-18 DIAGNOSIS — D62 Acute posthemorrhagic anemia: Secondary | ICD-10-CM | POA: Diagnosis not present

## 2019-06-18 DIAGNOSIS — M109 Gout, unspecified: Secondary | ICD-10-CM | POA: Diagnosis present

## 2019-06-18 DIAGNOSIS — Z8249 Family history of ischemic heart disease and other diseases of the circulatory system: Secondary | ICD-10-CM

## 2019-06-18 DIAGNOSIS — I2721 Secondary pulmonary arterial hypertension: Secondary | ICD-10-CM | POA: Diagnosis present

## 2019-06-18 DIAGNOSIS — S2243XA Multiple fractures of ribs, bilateral, initial encounter for closed fracture: Secondary | ICD-10-CM | POA: Diagnosis not present

## 2019-06-18 DIAGNOSIS — R069 Unspecified abnormalities of breathing: Secondary | ICD-10-CM

## 2019-06-18 DIAGNOSIS — I4891 Unspecified atrial fibrillation: Secondary | ICD-10-CM | POA: Diagnosis present

## 2019-06-18 DIAGNOSIS — I5032 Chronic diastolic (congestive) heart failure: Secondary | ICD-10-CM | POA: Diagnosis present

## 2019-06-18 DIAGNOSIS — A4159 Other Gram-negative sepsis: Principal | ICD-10-CM | POA: Diagnosis present

## 2019-06-18 DIAGNOSIS — I340 Nonrheumatic mitral (valve) insufficiency: Secondary | ICD-10-CM | POA: Diagnosis present

## 2019-06-18 DIAGNOSIS — D65 Disseminated intravascular coagulation [defibrination syndrome]: Secondary | ICD-10-CM | POA: Diagnosis present

## 2019-06-18 DIAGNOSIS — I953 Hypotension of hemodialysis: Secondary | ICD-10-CM | POA: Diagnosis not present

## 2019-06-18 DIAGNOSIS — R6521 Severe sepsis with septic shock: Secondary | ICD-10-CM | POA: Diagnosis present

## 2019-06-18 DIAGNOSIS — I959 Hypotension, unspecified: Secondary | ICD-10-CM

## 2019-06-18 DIAGNOSIS — N400 Enlarged prostate without lower urinary tract symptoms: Secondary | ICD-10-CM | POA: Diagnosis present

## 2019-06-18 DIAGNOSIS — Z4659 Encounter for fitting and adjustment of other gastrointestinal appliance and device: Secondary | ICD-10-CM

## 2019-06-18 DIAGNOSIS — E876 Hypokalemia: Secondary | ICD-10-CM | POA: Diagnosis not present

## 2019-06-18 DIAGNOSIS — N3081 Other cystitis with hematuria: Secondary | ICD-10-CM | POA: Diagnosis present

## 2019-06-18 DIAGNOSIS — I469 Cardiac arrest, cause unspecified: Secondary | ICD-10-CM | POA: Diagnosis not present

## 2019-06-18 DIAGNOSIS — Q441 Other congenital malformations of gallbladder: Secondary | ICD-10-CM

## 2019-06-18 DIAGNOSIS — E872 Acidosis: Secondary | ICD-10-CM | POA: Diagnosis present

## 2019-06-18 DIAGNOSIS — D6959 Other secondary thrombocytopenia: Secondary | ICD-10-CM | POA: Diagnosis present

## 2019-06-18 DIAGNOSIS — Z20822 Contact with and (suspected) exposure to covid-19: Secondary | ICD-10-CM | POA: Diagnosis present

## 2019-06-18 DIAGNOSIS — D735 Infarction of spleen: Secondary | ICD-10-CM | POA: Diagnosis present

## 2019-06-18 DIAGNOSIS — I4892 Unspecified atrial flutter: Secondary | ICD-10-CM | POA: Diagnosis present

## 2019-06-18 DIAGNOSIS — I351 Nonrheumatic aortic (valve) insufficiency: Secondary | ICD-10-CM | POA: Diagnosis present

## 2019-06-18 DIAGNOSIS — Y848 Other medical procedures as the cause of abnormal reaction of the patient, or of later complication, without mention of misadventure at the time of the procedure: Secondary | ICD-10-CM | POA: Diagnosis not present

## 2019-06-18 DIAGNOSIS — A419 Sepsis, unspecified organism: Secondary | ICD-10-CM

## 2019-06-18 DIAGNOSIS — I132 Hypertensive heart and chronic kidney disease with heart failure and with stage 5 chronic kidney disease, or end stage renal disease: Secondary | ICD-10-CM | POA: Diagnosis present

## 2019-06-18 DIAGNOSIS — B9689 Other specified bacterial agents as the cause of diseases classified elsewhere: Secondary | ICD-10-CM

## 2019-06-18 DIAGNOSIS — I728 Aneurysm of other specified arteries: Secondary | ICD-10-CM | POA: Diagnosis present

## 2019-06-18 DIAGNOSIS — Z992 Dependence on renal dialysis: Secondary | ICD-10-CM

## 2019-06-18 DIAGNOSIS — D869 Sarcoidosis, unspecified: Secondary | ICD-10-CM | POA: Diagnosis present

## 2019-06-18 DIAGNOSIS — R1310 Dysphagia, unspecified: Secondary | ICD-10-CM | POA: Diagnosis not present

## 2019-06-18 DIAGNOSIS — D631 Anemia in chronic kidney disease: Secondary | ICD-10-CM | POA: Diagnosis present

## 2019-06-18 DIAGNOSIS — R0682 Tachypnea, not elsewhere classified: Secondary | ICD-10-CM

## 2019-06-18 DIAGNOSIS — J811 Chronic pulmonary edema: Secondary | ICD-10-CM

## 2019-06-18 DIAGNOSIS — Z7189 Other specified counseling: Secondary | ICD-10-CM

## 2019-06-18 DIAGNOSIS — Z515 Encounter for palliative care: Secondary | ICD-10-CM | POA: Diagnosis not present

## 2019-06-18 DIAGNOSIS — Z681 Body mass index (BMI) 19 or less, adult: Secondary | ICD-10-CM

## 2019-06-18 DIAGNOSIS — R7881 Bacteremia: Secondary | ICD-10-CM

## 2019-06-18 DIAGNOSIS — E861 Hypovolemia: Secondary | ICD-10-CM | POA: Diagnosis present

## 2019-06-18 DIAGNOSIS — N2889 Other specified disorders of kidney and ureter: Secondary | ICD-10-CM | POA: Diagnosis present

## 2019-06-18 DIAGNOSIS — D696 Thrombocytopenia, unspecified: Secondary | ICD-10-CM

## 2019-06-18 DIAGNOSIS — J95851 Ventilator associated pneumonia: Secondary | ICD-10-CM | POA: Diagnosis not present

## 2019-06-18 DIAGNOSIS — E8889 Other specified metabolic disorders: Secondary | ICD-10-CM | POA: Diagnosis present

## 2019-06-18 MED ORDER — SODIUM CHLORIDE 0.9 % IV SOLN
2.0000 g | Freq: Once | INTRAVENOUS | Status: AC
  Administered 2019-06-19: 2 g via INTRAVENOUS
  Filled 2019-06-18: qty 2

## 2019-06-18 MED ORDER — LACTATED RINGERS IV BOLUS (SEPSIS)
500.0000 mL | Freq: Once | INTRAVENOUS | Status: AC
  Administered 2019-06-19: 03:00:00 500 mL via INTRAVENOUS

## 2019-06-18 MED ORDER — LACTATED RINGERS IV BOLUS (SEPSIS)
1000.0000 mL | Freq: Once | INTRAVENOUS | Status: AC
  Administered 2019-06-19: 01:00:00 1000 mL via INTRAVENOUS

## 2019-06-18 MED ORDER — VANCOMYCIN HCL IN DEXTROSE 1-5 GM/200ML-% IV SOLN
1000.0000 mg | Freq: Once | INTRAVENOUS | Status: AC
  Administered 2019-06-19: 1000 mg via INTRAVENOUS
  Filled 2019-06-18: qty 200

## 2019-06-18 MED ORDER — METRONIDAZOLE IN NACL 5-0.79 MG/ML-% IV SOLN
500.0000 mg | Freq: Once | INTRAVENOUS | Status: AC
  Administered 2019-06-19: 500 mg via INTRAVENOUS
  Filled 2019-06-18: qty 100

## 2019-06-18 MED ORDER — LACTATED RINGERS IV BOLUS (SEPSIS)
1000.0000 mL | Freq: Once | INTRAVENOUS | Status: AC
  Administered 2019-06-18: 1000 mL via INTRAVENOUS

## 2019-06-18 NOTE — ED Triage Notes (Signed)
Pt arrives via [facility] from home.   Per pt wife, pt has been lethargic and "just not himself", pt LKW s days ago.  Dialysis pt MWF, missed dialysis yesterday.  BP 76/45 with EMS, given 250 bolus of NS   Pt A&ox4, slow to respond. No facial droop, slurred speech.

## 2019-06-18 NOTE — ED Provider Notes (Signed)
[facility] EMERGENCY DEPARTMENT Provider Note   CSN: [id number] Arrival date & time: 06/17/2019  2306   History Chief Complaint  Patient presents with  . Weakness  . Hypotension    Gregory [name] is a 69 y.o. male.  The history is provided by the EMS personnel. The history is limited by the condition of the patient (Altered mental status).  Weakness He has history of hypertension, end-stage renal disease on hemodialysis and comes in because of lethargy and altered mentation.  His wife reported that he had not quite been himself for the last several days and that he had been lethargic.  He normally gets dialysis every Monday-Wednesday-Friday, but missed dialysis yesterday.  Patient is not able to give any relevant history.  EMS noted hypotension with blood pressure 76/45 and gave 250 mL of normal saline.  Past Medical History:  Diagnosis Date  . Anemia in chronic kidney disease   . Arthritis    "hands; basically all my joints" (11/08/2014)  . CHF (congestive heart failure) ([place])   . Chronic back pain    "mostly lower" (11/08/2014)  . End stage renal disease ([place])    pt does not urinate; pt. states that he does dialysis on MWF; [place]" (11/08/2014)  . GERD (gastroesophageal reflux disease)    "sometimes" (11/08/2014)  . Gout    "hands" (11/08/2014)  . Heart failure with reduced ejection fraction ([place])   . Hypertension     Patient Active Problem List   Diagnosis Date Noted  . 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 Preop cardiovascular exam 03/09/2014  . 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 Gout 08/25/2013  . Other complications due to renal dialysis device, implant, and graft 02/03/2013  . 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 Anemia in chronic kidney disease(285.21) 01/27/2013  . NICM (nonischemic cardiomyopathy) ([place]) 12/20/2012  . UTI (urinary tract infection) 12/20/2012  . 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 Decubitus ulcer of buttock, stage 3 ([place]) 12/20/2012  . ESRD on dialysis ([place]) 12/17/2012  . 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 Essential hypertension, benign 12/14/2012  . Anasarca 12/14/2012  . Back pain 12/14/2012    Past Surgical History:  Procedure Laterality Date  . AV FISTULA PLACEMENT Right 07/26/2009  . EMBOLIZATION Left 11/08/2014   internal iliac artery; Gore Excluder bifurcated stent graft for repair of common iliac aneurysm  . ENDOVASCULAR STENT INSERTION Bilateral 11/08/2014   Procedure: REPAIR OF BILATERAL ILIAC ARTERY ANEURYSM WITH  Bifurcated stent, with coiling left internal artery;  Surgeon: Elam Dutch, MD;  Location: [place];  Service: Vascular;  Laterality: Bilateral;  . ILIAC ARTERY ANEURYSM REPAIR Right 07/27/2017  . INSERTION OF DIALYSIS CATHETER N/A 02/22/2013   Procedure: INSERTION OF DIALYSIS CATHETER;  Surgeon: Elam Dutch, MD;  Location: [place];  Service: Vascular;  Laterality: N/A;  . LIGATION OF ARTERIOVENOUS  FISTULA Right 35/00/9381   Procedure: PLICATION OF ARTERIOVENOUS  FISTULA;  Surgeon: Elam Dutch, MD;  Location: [place];  Service: Vascular;  Laterality: Right;  . REPAIR ILIAC ARTERY Right 07/27/2017   Procedure: REPAIR ILIAC ARTERY ANEURYSM;  Surgeon: Elam Dutch, MD;  [facility];  Service: Vascular;  Laterality: Right;  . REVISON OF ARTERIOVENOUS FISTULA Right 82/99/3716   Procedure: PLICATION OF RIGHT ARM  ARTERIOVENOUS FISTULA;  Surgeon: Elam Dutch, MD;  Location: [place];  Service: Vascular;  Laterality: Right;       Family History  Problem Relation Age of Onset  . Diabetes Father   . Hypertension Father   . Diabetes Sister     Social History   Tobacco Use  . Smoking status: Never Smoker  . Smokeless tobacco: Never Used  Substance Use Topics  . Alcohol use: No  . Drug use: No    Home Medications Prior to Admission medications   Medication Sig Start Date End Date Taking?  Authorizing Provider  atorvastatin (LIPITOR) 40 MG tablet Take 40 mg by mouth daily.    [provider]  B Complex-C-Folic Acid (DIALYVITE 465 PO) Take 1 tablet by mouth every Monday, Wednesday, and Friday with hemodialysis. After dialysis.    [provider]  calcium carbonate (OS-CAL) 1250 (500 Ca) MG chewable tablet Chew 2 tablets by mouth at bedtime.    [provider]  cinacalcet (SENSIPAR) 60 MG tablet Take 60 mg by mouth daily after supper.    [provider]  colchicine 0.6 MG tablet Take 0.6 mg by mouth daily as needed (for gout flare ups.).  12/11/14   [provider]  oxyCODONE-acetaminophen (PERCOCET) 10-325 MG tablet Take 1 tablet by mouth 4 (four) times daily.  05/09/16   [provider]  predniSONE (DELTASONE) 5 MG tablet Take 5 mg by mouth daily.  12/11/14   [provider]  promethazine (PHENERGAN) 25 MG tablet Take 25 mg by mouth every 6 (six) hours as needed for nausea or vomiting.    [provider]  sucroferric oxyhydroxide (VELPHORO) 500 MG chewable tablet Chew 250 mg by mouth 3 (three) times daily with meals.     [provider]    Allergies    Patient has no known allergies.  Review of Systems   Review of Systems  Unable to perform ROS: Mental status change  Neurological: Positive for weakness.    Physical Exam Updated Vital Signs BP (!) 74/50   Pulse 96   Temp 99.3 F (37.4 C) (Oral)   Resp 16   SpO2 97%   Physical Exam Vitals and nursing note reviewed.   69 year old male, resting comfortably and in no acute distress. Vital signs are significant for low blood pressure and borderline fever. Oxygen saturation is 97%, which is normal. Head is normocephalic and atraumatic. PERRLA, EOMI. Oropharynx is clear. Neck is nontender and supple without adenopathy or JVD. Back is nontender and there is no CVA tenderness. Lungs are clear without rales, wheezes, or rhonchi. Chest is  nontender. Heart has regular rate and rhythm without murmur. Abdomen is soft, flat, nontender without masses or hepatosplenomegaly and peristalsis is normoactive. Extremities have no cyanosis or edema, full range of motion is present.  AV fistula present in the right arm with thrill present. Skin is warm and dry without rash. Neurologic: Awake and oriented to person but not place or time, cranial nerves are intact, there are no motor or sensory deficits.  ED Results / Procedures / Treatments   Labs (all labs ordered are listed, but only abnormal results are displayed) Labs Reviewed  LACTIC ACID, PLASMA - Abnormal; Notable for the following components:      Result Value   Lactic Acid, Venous 3.2 (*)    All other components within normal limits  COMPREHENSIVE METABOLIC PANEL - Abnormal; Notable for the following components:   Potassium 6.1 (*)    Chloride 95 (*)    BUN 89 (*)    Creatinine, Ser 9.78 (*)    Calcium 8.0 (*)  Total Protein 5.8 (*)    Albumin 2.1 (*)    Alkaline Phosphatase 136 (*)    Total Bilirubin 2.4 (*)    GFR calc non Af Amer 5 (*)    GFR calc Af Amer 6 (*)    Anion gap 18 (*)    All other components within normal limits  CBC WITH DIFFERENTIAL/PLATELET - Abnormal; Notable for the following components:   WBC 25.7 (*)    HCT 38.3 (*)    Platelets 17 (*)    Neutro Abs 23.4 (*)    Lymphs Abs 0.3 (*)    Monocytes Absolute 1.5 (*)    Abs Immature Granulocytes 0.40 (*)    All other components within normal limits  PROTIME-INR - Abnormal; Notable for the following components:   Prothrombin Time 15.3 (*)    All other components within normal limits  RESPIRATORY PANEL BY RT PCR (FLU A&B, COVID)  CULTURE, BLOOD (ROUTINE X 2)  CULTURE, BLOOD (ROUTINE X 2)  URINE CULTURE  APTT  LACTIC ACID, PLASMA  URINALYSIS, ROUTINE W REFLEX MICROSCOPIC  LACTIC ACID, PLASMA    EKG EKG Interpretation  Date/Time:  Saturday June 18 2019 23:28:35 EST Ventricular Rate:   97 PR Interval:    QRS Duration: 94 QT Interval:  365 QTC Calculation: 464 R Axis:   1 Text Interpretation: Sinus rhythm Normal ECG When compared with ECG of 07/28/2017, No significant change was found Confirmed by Delora Fuel ([id number]) on 06/21/2019 11:30:39 PM   Radiology DG Chest Port 1 View  Result Date: 06/19/2019 CLINICAL DATA:  Fever.  Lethargic.  Dialysis patient. EXAM: PORTABLE CHEST 1 VIEW COMPARISON:  07/27/2017 FINDINGS: Chronic cardiomegaly and aortic atherosclerosis. 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 Chronic elevation of the left hemidiaphragm with chronic left base volume loss. No acute bone finding. Previous surgical clips at the thoracic inlet. IMPRESSION: Cardiomegaly.  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 Chronic elevation of the left hemidiaphragm with chronic volume loss at the left lung base. Electronically Signed   By: Nelson Chimes M.D.   On: 06/19/2019 00:38    Procedures Procedures  CRITICAL CARE Performed by: Delora Fuel Total critical care time: 135 minutes Critical care time was exclusive of separately billable procedures and treating other patients. Critical care was necessary to treat or prevent imminent or life-threatening deterioration. Critical care was time spent personally by me on the following activities: development of treatment plan with patient and/or surrogate as well as nursing, discussions with consultants, evaluation of patient's response to treatment, examination of patient, obtaining history from patient or surrogate, ordering and performing treatments and interventions, ordering and review of laboratory studies, ordering and review of radiographic studies, pulse oximetry and re-evaluation of patient's condition.  Medications Ordered in ED Medications  vancomycin (VANCOREADY) IVPB 750 mg/150 mL (750 mg Intravenous New Bag/Given 06/19/19 0325)  vancomycin (VANCOCIN) IVPB 1000 mg/200 mL premix (has no administration in  time range)  ceFEPIme (MAXIPIME) 1 g in sodium chloride 0.9 % 100 mL IVPB (has no administration in time range)  lactated ringers bolus 1,000 mL (0 mLs Intravenous Stopped 06/19/19 0205)    And  lactated ringers bolus 1,000 mL (0 mLs Intravenous Stopped 06/19/19 0205)    And  lactated ringers bolus 500 mL (0 mLs Intravenous Stopped 06/19/19 0304)  ceFEPIme (MAXIPIME) 2 g in sodium chloride 0.9 % 100 mL IVPB (0 g Intravenous Stopped 06/19/19 0052)  metroNIDAZOLE (FLAGYL) IVPB 500 mg (0 mg Intravenous Stopped 06/19/19 0205)  vancomycin (VANCOCIN) IVPB  1000 mg/200 mL premix (0 mg Intravenous Stopped 06/19/19 0205)  lactated ringers bolus 1,000 mL (0 mLs Intravenous Stopped 06/19/19 0351)    ED Course  I have reviewed the triage vital signs and the nursing notes.  Pertinent labs & imaging results that were available during my care of the patient were reviewed by me and considered in my medical decision making (see chart for details).  MDM Rules/Calculators/A&P Lethargy with hypotension and borderline fever concerning for sepsis.  No obvious source of infection, but he is started on sepsis protocol including early, goal-directed fluid therapy.  He is also started on empiric antibiotics.  In setting of COVID-19 pandemic, swab is sent for Covid 19 and influenza.  Old records are reviewed, and he has no relevant past visits.  ECG is normal.  Chest x-ray did not show evidence of pneumonia.  Blood pressure has come up slightly with fluids.  Lactic acid is moderately elevated at 3.5.  WBC is markedly elevated with left shift, severe thrombocytopenia is noted with platelet count of 17,000, baseline and around 100,000.  Following fluids, he continued to be tachycardic and he is given additional IV fluids.  At this point, he does not need pressors.  Influenza and COVID-19 tests are negative.  Mental status is same as when he arrived.  Case is discussed with Dr. Marlowe Sax of [facility] hospitalist who agrees to admit the  patient, but requests critical care consultation.  Case is discussed with Dr. Oletta Darter of critical care service who agrees to have the patient seen in consultation.  TATSUYA OKRAY was evaluated in Emergency Department on 06/22/2019 for the symptoms described in the history of present illness. He was evaluated in the context of the global COVID-19 pandemic, which necessitated consideration that the patient might be at risk for infection with the SARS-CoV-2 virus that causes COVID-19. Institutional protocols and algorithms that pertain to the evaluation of patients at risk for COVID-19 are in a state of rapid change based on information released by regulatory bodies including the CDC and federal and state organizations. These policies and algorithms were followed during the patient's care in the ED.  Final Clinical Impression(s) / ED Diagnoses Final diagnoses:  Sepsis due to undetermined organism ([place])  Hypotension, unspecified hypotension type  Thrombocytopenia ([place])  Confusion  End-stage renal disease on hemodialysis [facility])    Rx / DC Orders ED Discharge Orders    None       Delora Fuel, MD 25/85/27 [phone]

## 2019-06-19 ENCOUNTER — Inpatient Hospital Stay (HOSPITAL_COMMUNITY): Payer: Medicare Other

## 2019-06-19 ENCOUNTER — Emergency Department (HOSPITAL_COMMUNITY): Payer: Medicare Other

## 2019-06-19 DIAGNOSIS — S225XXA Flail chest, initial encounter for closed fracture: Secondary | ICD-10-CM | POA: Diagnosis not present

## 2019-06-19 DIAGNOSIS — I350 Nonrheumatic aortic (valve) stenosis: Secondary | ICD-10-CM | POA: Diagnosis not present

## 2019-06-19 DIAGNOSIS — D65 Disseminated intravascular coagulation [defibrination syndrome]: Secondary | ICD-10-CM | POA: Diagnosis present

## 2019-06-19 DIAGNOSIS — D649 Anemia, unspecified: Secondary | ICD-10-CM | POA: Diagnosis not present

## 2019-06-19 DIAGNOSIS — Z681 Body mass index (BMI) 19 or less, adult: Secondary | ICD-10-CM | POA: Diagnosis not present

## 2019-06-19 DIAGNOSIS — G9341 Metabolic encephalopathy: Secondary | ICD-10-CM | POA: Diagnosis present

## 2019-06-19 DIAGNOSIS — Z66 Do not resuscitate: Secondary | ICD-10-CM | POA: Diagnosis not present

## 2019-06-19 DIAGNOSIS — M7989 Other specified soft tissue disorders: Secondary | ICD-10-CM | POA: Diagnosis not present

## 2019-06-19 DIAGNOSIS — I4892 Unspecified atrial flutter: Secondary | ICD-10-CM | POA: Diagnosis present

## 2019-06-19 DIAGNOSIS — I959 Hypotension, unspecified: Secondary | ICD-10-CM | POA: Diagnosis not present

## 2019-06-19 DIAGNOSIS — I480 Paroxysmal atrial fibrillation: Secondary | ICD-10-CM | POA: Diagnosis not present

## 2019-06-19 DIAGNOSIS — A419 Sepsis, unspecified organism: Secondary | ICD-10-CM | POA: Diagnosis not present

## 2019-06-19 DIAGNOSIS — R6521 Severe sepsis with septic shock: Secondary | ICD-10-CM

## 2019-06-19 DIAGNOSIS — Z79899 Other long term (current) drug therapy: Secondary | ICD-10-CM | POA: Diagnosis not present

## 2019-06-19 DIAGNOSIS — N186 End stage renal disease: Secondary | ICD-10-CM

## 2019-06-19 DIAGNOSIS — E2740 Unspecified adrenocortical insufficiency: Secondary | ICD-10-CM | POA: Diagnosis present

## 2019-06-19 DIAGNOSIS — Z515 Encounter for palliative care: Secondary | ICD-10-CM | POA: Diagnosis not present

## 2019-06-19 DIAGNOSIS — R579 Shock, unspecified: Secondary | ICD-10-CM | POA: Diagnosis not present

## 2019-06-19 DIAGNOSIS — I132 Hypertensive heart and chronic kidney disease with heart failure and with stage 5 chronic kidney disease, or end stage renal disease: Secondary | ICD-10-CM | POA: Diagnosis present

## 2019-06-19 DIAGNOSIS — N2889 Other specified disorders of kidney and ureter: Secondary | ICD-10-CM | POA: Diagnosis not present

## 2019-06-19 DIAGNOSIS — N2581 Secondary hyperparathyroidism of renal origin: Secondary | ICD-10-CM | POA: Diagnosis present

## 2019-06-19 DIAGNOSIS — Z992 Dependence on renal dialysis: Secondary | ICD-10-CM | POA: Diagnosis not present

## 2019-06-19 DIAGNOSIS — E46 Unspecified protein-calorie malnutrition: Secondary | ICD-10-CM | POA: Diagnosis not present

## 2019-06-19 DIAGNOSIS — D696 Thrombocytopenia, unspecified: Secondary | ICD-10-CM | POA: Diagnosis not present

## 2019-06-19 DIAGNOSIS — J95851 Ventilator associated pneumonia: Secondary | ICD-10-CM | POA: Diagnosis not present

## 2019-06-19 DIAGNOSIS — N3001 Acute cystitis with hematuria: Secondary | ICD-10-CM | POA: Diagnosis present

## 2019-06-19 DIAGNOSIS — Y848 Other medical procedures as the cause of abnormal reaction of the patient, or of later complication, without mention of misadventure at the time of the procedure: Secondary | ICD-10-CM | POA: Diagnosis not present

## 2019-06-19 DIAGNOSIS — I351 Nonrheumatic aortic (valve) insufficiency: Secondary | ICD-10-CM

## 2019-06-19 DIAGNOSIS — A4159 Other Gram-negative sepsis: Secondary | ICD-10-CM | POA: Diagnosis present

## 2019-06-19 DIAGNOSIS — E43 Unspecified severe protein-calorie malnutrition: Secondary | ICD-10-CM | POA: Diagnosis present

## 2019-06-19 DIAGNOSIS — S2243XA Multiple fractures of ribs, bilateral, initial encounter for closed fracture: Secondary | ICD-10-CM | POA: Diagnosis not present

## 2019-06-19 DIAGNOSIS — R410 Disorientation, unspecified: Secondary | ICD-10-CM | POA: Diagnosis present

## 2019-06-19 DIAGNOSIS — I340 Nonrheumatic mitral (valve) insufficiency: Secondary | ICD-10-CM

## 2019-06-19 DIAGNOSIS — I471 Supraventricular tachycardia: Secondary | ICD-10-CM | POA: Diagnosis present

## 2019-06-19 DIAGNOSIS — I950 Idiopathic hypotension: Secondary | ICD-10-CM | POA: Diagnosis not present

## 2019-06-19 DIAGNOSIS — R64 Cachexia: Secondary | ICD-10-CM | POA: Diagnosis present

## 2019-06-19 DIAGNOSIS — R7989 Other specified abnormal findings of blood chemistry: Secondary | ICD-10-CM | POA: Diagnosis not present

## 2019-06-19 DIAGNOSIS — N179 Acute kidney failure, unspecified: Secondary | ICD-10-CM | POA: Diagnosis present

## 2019-06-19 DIAGNOSIS — Y92239 Unspecified place in hospital as the place of occurrence of the external cause: Secondary | ICD-10-CM | POA: Diagnosis not present

## 2019-06-19 DIAGNOSIS — I469 Cardiac arrest, cause unspecified: Secondary | ICD-10-CM | POA: Diagnosis not present

## 2019-06-19 DIAGNOSIS — Z7189 Other specified counseling: Secondary | ICD-10-CM | POA: Diagnosis not present

## 2019-06-19 DIAGNOSIS — Z20822 Contact with and (suspected) exposure to covid-19: Secondary | ICD-10-CM | POA: Diagnosis present

## 2019-06-19 DIAGNOSIS — J9601 Acute respiratory failure with hypoxia: Secondary | ICD-10-CM | POA: Diagnosis present

## 2019-06-19 DIAGNOSIS — E872 Acidosis: Secondary | ICD-10-CM | POA: Diagnosis present

## 2019-06-19 DIAGNOSIS — D62 Acute posthemorrhagic anemia: Secondary | ICD-10-CM | POA: Diagnosis not present

## 2019-06-19 LAB — CBC WITH DIFFERENTIAL/PLATELET
Abs Immature Granulocytes: 0.4 10*3/uL — ABNORMAL HIGH (ref 0.00–0.07)
Basophils Absolute: 0.1 10*3/uL (ref 0.0–0.1)
Basophils Relative: 0 %
Eosinophils Absolute: 0 10*3/uL (ref 0.0–0.5)
Eosinophils Relative: 0 %
HCT: 38.3 % — ABNORMAL LOW (ref 39.0–52.0)
Hemoglobin: 13.1 g/dL (ref 13.0–17.0)
Immature Granulocytes: 2 %
Lymphocytes Relative: 1 %
Lymphs Abs: 0.3 10*3/uL — ABNORMAL LOW (ref 0.7–4.0)
MCH: 29.8 pg (ref 26.0–34.0)
MCHC: 34.2 g/dL (ref 30.0–36.0)
MCV: 87.2 fL (ref 80.0–100.0)
Monocytes Absolute: 1.5 10*3/uL — ABNORMAL HIGH (ref 0.1–1.0)
Monocytes Relative: 6 %
Neutro Abs: 23.4 10*3/uL — ABNORMAL HIGH (ref 1.7–7.7)
Neutrophils Relative %: 91 %
Platelets: 17 10*3/uL — CL (ref 150–400)
RBC: 4.39 MIL/uL (ref 4.22–5.81)
RDW: 15.1 % (ref 11.5–15.5)
WBC: 25.7 10*3/uL — ABNORMAL HIGH (ref 4.0–10.5)
nRBC: 0 % (ref 0.0–0.2)

## 2019-06-19 LAB — BASIC METABOLIC PANEL
Anion gap: 18 — ABNORMAL HIGH (ref 5–15)
Anion gap: 17 — ABNORMAL HIGH (ref 5–15)
BUN: 86 mg/dL — ABNORMAL HIGH (ref 8–23)
BUN: 82 mg/dL — ABNORMAL HIGH (ref 8–23)
CO2: 20 mmol/L — ABNORMAL LOW (ref 22–32)
CO2: 20 mmol/L — ABNORMAL LOW (ref 22–32)
Calcium: 8.1 mg/dL — ABNORMAL LOW (ref 8.9–10.3)
Calcium: 7.8 mg/dL — ABNORMAL LOW (ref 8.9–10.3)
Chloride: 97 mmol/L — ABNORMAL LOW (ref 98–111)
Chloride: 102 mmol/L (ref 98–111)
Creatinine, Ser: 9.14 mg/dL — ABNORMAL HIGH (ref 0.61–1.24)
Creatinine, Ser: 8.11 mg/dL — ABNORMAL HIGH (ref 0.61–1.24)
GFR calc Af Amer: 6 mL/min — ABNORMAL LOW (ref >60)
GFR calc Af Amer: 7 mL/min — ABNORMAL LOW (ref >60)
GFR calc non Af Amer: 5 mL/min — ABNORMAL LOW (ref >60)
GFR calc non Af Amer: 6 mL/min — ABNORMAL LOW (ref >60)
Glucose, Bld: 100 mg/dL — ABNORMAL HIGH (ref 70–99)
Glucose, Bld: 104 mg/dL — ABNORMAL HIGH (ref 70–99)
Potassium: 4.7 mmol/L (ref 3.5–5.1)
Potassium: 4.5 mmol/L (ref 3.5–5.1)
Sodium: 135 mmol/L (ref 135–145)
Sodium: 139 mmol/L (ref 135–145)

## 2019-06-19 LAB — CBC
HCT: 37.2 % — ABNORMAL LOW (ref 39.0–52.0)
Hemoglobin: 13 g/dL (ref 13.0–17.0)
MCH: 30 pg (ref 26.0–34.0)
MCHC: 34.9 g/dL (ref 30.0–36.0)
MCV: 85.7 fL (ref 80.0–100.0)
Platelets: 13 10*3/uL — CL (ref 150–400)
RBC: 4.34 MIL/uL (ref 4.22–5.81)
RDW: 14.9 % (ref 11.5–15.5)
WBC: 19.2 10*3/uL — ABNORMAL HIGH (ref 4.0–10.5)
nRBC: 0 % (ref 0.0–0.2)

## 2019-06-19 LAB — BLOOD CULTURE ID PANEL (REFLEXED)

## 2019-06-19 LAB — LACTIC ACID, PLASMA
Lactic Acid, Venous: 3.2 mmol/L (ref 0.5–1.9)
Lactic Acid, Venous: 2.5 mmol/L (ref 0.5–1.9)

## 2019-06-19 LAB — ECHOCARDIOGRAM COMPLETE
Height: 76 in
Weight: 2567.92 oz

## 2019-06-19 LAB — COMPREHENSIVE METABOLIC PANEL
ALT: 9 U/L (ref 0–44)
AST: 32 U/L (ref 15–41)
Albumin: 2.1 g/dL — ABNORMAL LOW (ref 3.5–5.0)
Alkaline Phosphatase: 136 U/L — ABNORMAL HIGH (ref 38–126)
Anion gap: 18 — ABNORMAL HIGH (ref 5–15)
BUN: 89 mg/dL — ABNORMAL HIGH (ref 8–23)
CO2: 22 mmol/L (ref 22–32)
Calcium: 8 mg/dL — ABNORMAL LOW (ref 8.9–10.3)
Chloride: 95 mmol/L — ABNORMAL LOW (ref 98–111)
Creatinine, Ser: 9.78 mg/dL — ABNORMAL HIGH (ref 0.61–1.24)
GFR calc Af Amer: 6 mL/min — ABNORMAL LOW (ref >60)
GFR calc non Af Amer: 5 mL/min — ABNORMAL LOW (ref >60)
Glucose, Bld: 92 mg/dL (ref 70–99)
Potassium: 6.1 mmol/L — ABNORMAL HIGH (ref 3.5–5.1)
Sodium: 135 mmol/L (ref 135–145)
Total Bilirubin: 2.4 mg/dL — ABNORMAL HIGH (ref 0.3–1.2)
Total Protein: 5.8 g/dL — ABNORMAL LOW (ref 6.5–8.1)

## 2019-06-19 LAB — BLOOD GAS, ARTERIAL
Acid-base deficit: 2.8 mmol/L — ABNORMAL HIGH (ref 0.0–2.0)
Bicarbonate: 21.2 mmol/L (ref 20.0–28.0)
Drawn by: 137461
FIO2: 21
O2 Saturation: 90.7 %
Patient temperature: 37
pCO2 arterial: 34.6 mmHg (ref 32.0–48.0)
pH, Arterial: 7.404 (ref 7.350–7.450)
pO2, Arterial: 63.3 mmHg — ABNORMAL LOW (ref 83.0–108.0)

## 2019-06-19 LAB — TSH: TSH: 1.198 u[IU]/mL (ref 0.350–4.500)

## 2019-06-19 LAB — HIV ANTIBODY (ROUTINE TESTING W REFLEX): HIV Screen 4th Generation wRfx: NONREACTIVE

## 2019-06-19 LAB — GLUCOSE, CAPILLARY
Glucose-Capillary: 95 mg/dL (ref 70–99)
Glucose-Capillary: 84 mg/dL (ref 70–99)
Glucose-Capillary: 90 mg/dL (ref 70–99)
Glucose-Capillary: 89 mg/dL (ref 70–99)
Glucose-Capillary: 100 mg/dL — ABNORMAL HIGH (ref 70–99)

## 2019-06-19 LAB — RESPIRATORY PANEL BY RT PCR (FLU A&B, COVID)
Influenza A by PCR: NEGATIVE
Influenza B by PCR: NEGATIVE
SARS Coronavirus 2 by RT PCR: NEGATIVE

## 2019-06-19 LAB — DIC (DISSEMINATED INTRAVASCULAR COAGULATION)PANEL
D-Dimer, Quant: 8.47 ug/mL-FEU — ABNORMAL HIGH (ref 0.00–0.50)
Fibrinogen: 519 mg/dL — ABNORMAL HIGH (ref 210–475)
INR: 1.2 (ref 0.8–1.2)
Platelets: 13 10*3/uL — CL (ref 150–400)
Prothrombin Time: 15.4 seconds — ABNORMAL HIGH (ref 11.4–15.2)
Smear Review: NONE SEEN
aPTT: 33 seconds (ref 24–36)

## 2019-06-19 LAB — APTT: aPTT: 27 seconds (ref 24–36)

## 2019-06-19 LAB — TYPE AND SCREEN
ABO/RH(D): O POS
Antibody Screen: NEGATIVE

## 2019-06-19 LAB — AMMONIA: Ammonia: 35 umol/L (ref 9–35)

## 2019-06-19 LAB — MAGNESIUM: Magnesium: 1.8 mg/dL (ref 1.7–2.4)

## 2019-06-19 LAB — PHOSPHORUS: Phosphorus: 2.3 mg/dL — ABNORMAL LOW (ref 2.5–4.6)

## 2019-06-19 LAB — MRSA PCR SCREENING: MRSA by PCR: NEGATIVE

## 2019-06-19 LAB — PROTIME-INR
INR: 1.2 (ref 0.8–1.2)
Prothrombin Time: 15.3 seconds — ABNORMAL HIGH (ref 11.4–15.2)

## 2019-06-19 LAB — TROPONIN I (HIGH SENSITIVITY): Troponin I (High Sensitivity): 853 ng/L (ref <18)

## 2019-06-19 LAB — CORTISOL: Cortisol, Plasma: 39.8 ug/dL

## 2019-06-19 MED ORDER — INSULIN ASPART 100 UNIT/ML IV SOLN: 5.0000 [IU] | Freq: Once | INTRAVENOUS | Status: DC

## 2019-06-19 MED ORDER — SODIUM CHLORIDE 0.9 % IV BOLUS
1000.0000 mL | Freq: Once | INTRAVENOUS | Status: AC
  Administered 2019-06-19: 1000 mL via INTRAVENOUS

## 2019-06-19 MED ORDER — IOHEXOL 350 MG/ML SOLN
100.0000 mL | Freq: Once | INTRAVENOUS | Status: AC | PRN
  Administered 2019-06-19: 100 mL via INTRAVENOUS

## 2019-06-19 MED ORDER — PHENYLEPHRINE HCL-NACL 10-0.9 MG/250ML-% IV SOLN
0.0000 ug/min | INTRAVENOUS | Status: DC
  Administered 2019-06-19: 19:00:00 350 ug/min via INTRAVENOUS
  Administered 2019-06-19: 18:00:00 400 ug/min via INTRAVENOUS
  Administered 2019-06-19: 21:00:00 140 ug/min via INTRAVENOUS
  Administered 2019-06-19: 400 ug/min via INTRAVENOUS
  Filled 2019-06-19: qty 500
  Filled 2019-06-19 (×2): qty 250

## 2019-06-19 MED ORDER — CALCIUM GLUCONATE-NACL 1-0.675 GM/50ML-% IV SOLN
1.0000 g | Freq: Once | INTRAVENOUS | Status: AC
  Administered 2019-06-19: 07:00:00 1000 mg via INTRAVENOUS
  Filled 2019-06-19: qty 50

## 2019-06-19 MED ORDER — SODIUM CHLORIDE 0.9% IV SOLUTION: Freq: Once | INTRAVENOUS | Status: AC

## 2019-06-19 MED ORDER — ADENOSINE 6 MG/2ML IV SOLN
INTRAVENOUS | Status: AC
  Administered 2019-06-19: 07:00:00 6 mg via INTRAVENOUS
  Filled 2019-06-19: qty 4

## 2019-06-19 MED ORDER — CHLORHEXIDINE GLUCONATE CLOTH 2 % EX PADS
6.0000 | MEDICATED_PAD | Freq: Every day | CUTANEOUS | Status: DC
  Administered 2019-06-19 – 2019-06-22 (×4): 6 via TOPICAL

## 2019-06-19 MED ORDER — FENTANYL CITRATE (PF) 100 MCG/2ML IJ SOLN
INTRAMUSCULAR | Status: AC
  Administered 2019-06-19: 07:00:00 25 ug via INTRAVENOUS
  Filled 2019-06-19: qty 2

## 2019-06-19 MED ORDER — AMIODARONE HCL IN DEXTROSE 360-4.14 MG/200ML-% IV SOLN
60.0000 mg/h | INTRAVENOUS | Status: DC
  Administered 2019-06-19 (×2): 60 mg/h via INTRAVENOUS
  Filled 2019-06-19 (×2): qty 200

## 2019-06-19 MED ORDER — LACTATED RINGERS IV BOLUS
1000.0000 mL | Freq: Once | INTRAVENOUS | Status: AC
  Administered 2019-06-19: 1000 mL via INTRAVENOUS

## 2019-06-19 MED ORDER — AMIODARONE LOAD VIA INFUSION
150.0000 mg | INTRAVENOUS | Status: AC
  Administered 2019-06-19: 150 mg via INTRAVENOUS
  Filled 2019-06-19: qty 83.34

## 2019-06-19 MED ORDER — METOPROLOL TARTRATE 5 MG/5ML IV SOLN
INTRAVENOUS | Status: AC
  Filled 2019-06-19: qty 5

## 2019-06-19 MED ORDER — PHENYLEPHRINE CONCENTRATED 100MG/250ML (0.4 MG/ML) INFUSION SIMPLE
0.0000 ug/min | INTRAVENOUS | Status: DC
  Administered 2019-06-19: 20 ug/min via INTRAVENOUS
  Administered 2019-06-20 – 2019-06-21 (×2): 100 ug/min via INTRAVENOUS
  Administered 2019-06-22: 90 ug/min via INTRAVENOUS
  Administered 2019-06-23: 75 ug/min via INTRAVENOUS
  Administered 2019-06-23: 90 ug/min via INTRAVENOUS
  Filled 2019-06-19 (×7): qty 250

## 2019-06-19 MED ORDER — LIDOCAINE HCL (PF) 1 % IJ SOLN
INTRAMUSCULAR | Status: AC
  Filled 2019-06-19: qty 5

## 2019-06-19 MED ORDER — PREDNISONE 5 MG PO TABS: 5.0000 mg | ORAL_TABLET | Freq: Every day | ORAL | Status: DC

## 2019-06-19 MED ORDER — HEPARIN SODIUM (PORCINE) 5000 UNIT/ML IJ SOLN: 5000.0000 [IU] | Freq: Three times a day (TID) | INTRAMUSCULAR | Status: DC

## 2019-06-19 MED ORDER — VASOPRESSIN 20 UNIT/ML IV SOLN
0.0300 [IU]/min | INTRAVENOUS | Status: DC
  Administered 2019-06-19 – 2019-06-22 (×4): 0.03 [IU]/min via INTRAVENOUS
  Filled 2019-06-19 (×4): qty 2

## 2019-06-19 MED ORDER — HEPARIN SODIUM (PORCINE) 1000 UNIT/ML DIALYSIS
1000.0000 [IU] | INTRAMUSCULAR | Status: DC | PRN
  Administered 2019-06-19: 2800 [IU] via INTRAVENOUS_CENTRAL
  Administered 2019-06-21: 1000 [IU] via INTRAVENOUS_CENTRAL
  Administered 2019-06-21 – 2019-06-26 (×2): 3000 [IU] via INTRAVENOUS_CENTRAL
  Filled 2019-06-19: qty 3
  Filled 2019-06-19: qty 6
  Filled 2019-06-19: qty 3
  Filled 2019-06-19 (×2): qty 6
  Filled 2019-06-19 (×2): qty 3
  Filled 2019-06-19 (×2): qty 6

## 2019-06-19 MED ORDER — SODIUM CHLORIDE 0.9 % IV BOLUS
500.0000 mL | Freq: Once | INTRAVENOUS | Status: AC
  Administered 2019-06-19: 500 mL via INTRAVENOUS

## 2019-06-19 MED ORDER — ALBUMIN HUMAN 5 % IV SOLN
12.5000 g | Freq: Once | INTRAVENOUS | Status: AC
  Administered 2019-06-19: 06:00:00 12.5 g via INTRAVENOUS
  Filled 2019-06-19: qty 250

## 2019-06-19 MED ORDER — ADENOSINE 6 MG/2ML IV SOLN: 12.0000 mg | Freq: Once | INTRAVENOUS | Status: AC

## 2019-06-19 MED ORDER — CINACALCET HCL 30 MG PO TABS
90.0000 mg | ORAL_TABLET | Freq: Every day | ORAL | Status: DC
  Administered 2019-06-21 – 2019-06-26 (×6): 90 mg via ORAL
  Filled 2019-06-19 (×8): qty 3

## 2019-06-19 MED ORDER — MIDODRINE HCL 5 MG PO TABS
10.0000 mg | ORAL_TABLET | Freq: Three times a day (TID) | ORAL | Status: DC
  Administered 2019-06-19 – 2019-07-02 (×34): 10 mg via ORAL
  Filled 2019-06-19 (×38): qty 2

## 2019-06-19 MED ORDER — FENTANYL CITRATE (PF) 100 MCG/2ML IJ SOLN: 25.0000 ug | Freq: Once | INTRAMUSCULAR | Status: AC

## 2019-06-19 MED ORDER — SODIUM CHLORIDE 0.9 % IV SOLN
250.0000 mL | INTRAVENOUS | Status: DC
  Administered 2019-06-23 – 2019-07-02 (×2): 250 mL via INTRAVENOUS

## 2019-06-19 MED ORDER — NOREPINEPHRINE 4 MG/250ML-% IV SOLN
0.0000 ug/min | INTRAVENOUS | Status: DC
  Administered 2019-06-19: 17 ug/min via INTRAVENOUS
  Administered 2019-06-19: 19:00:00 2 ug/min via INTRAVENOUS
  Administered 2019-06-20: 14 ug/min via INTRAVENOUS
  Administered 2019-06-20: 12 ug/min via INTRAVENOUS
  Filled 2019-06-19 (×4): qty 250

## 2019-06-19 MED ORDER — DILTIAZEM HCL-DEXTROSE 125-5 MG/125ML-% IV SOLN (PREMIX)
5.0000 mg/h | INTRAVENOUS | Status: DC
  Filled 2019-06-19: qty 125

## 2019-06-19 MED ORDER — ADENOSINE 6 MG/2ML IV SOLN: 6.0000 mg | Freq: Once | INTRAVENOUS | Status: AC

## 2019-06-19 MED ORDER — VANCOMYCIN HCL 750 MG/150ML IV SOLN
750.0000 mg | Freq: Once | INTRAVENOUS | Status: AC
  Administered 2019-06-19: 750 mg via INTRAVENOUS
  Filled 2019-06-19: qty 150

## 2019-06-19 MED ORDER — DEXTROSE 50 % IV SOLN
1.0000 | Freq: Once | INTRAVENOUS | Status: DC
  Filled 2019-06-19: qty 50

## 2019-06-19 MED ORDER — SODIUM BICARBONATE 8.4 % IV SOLN
150.0000 meq | Freq: Once | INTRAVENOUS | Status: AC
  Administered 2019-06-19: 50 meq via INTRAVENOUS
  Filled 2019-06-19: qty 50

## 2019-06-19 MED ORDER — HYDROCORTISONE NA SUCCINATE PF 100 MG IJ SOLR
50.0000 mg | Freq: Four times a day (QID) | INTRAMUSCULAR | Status: DC
  Administered 2019-06-19 – 2019-06-26 (×27): 50 mg via INTRAVENOUS
  Filled 2019-06-19 (×27): qty 2

## 2019-06-19 MED ORDER — AMIODARONE HCL IN DEXTROSE 360-4.14 MG/200ML-% IV SOLN: 30.0000 mg/h | INTRAVENOUS | Status: DC

## 2019-06-19 MED ORDER — VANCOMYCIN HCL IN DEXTROSE 1-5 GM/200ML-% IV SOLN
1000.0000 mg | INTRAVENOUS | Status: DC
  Filled 2019-06-19: qty 200

## 2019-06-19 MED ORDER — ADENOSINE 6 MG/2ML IV SOLN
INTRAVENOUS | Status: AC
  Administered 2019-06-19: 12 mg via INTRAVENOUS
  Filled 2019-06-19: qty 2

## 2019-06-19 MED ORDER — "THROMBI-PAD 3""X3"" EX PADS"
1.0000 | MEDICATED_PAD | Freq: Once | CUTANEOUS | Status: DC
  Filled 2019-06-19: qty 1

## 2019-06-19 MED ORDER — SODIUM CHLORIDE 0.9 % IV SOLN
1.0000 g | INTRAVENOUS | Status: DC
  Administered 2019-06-19: 22:00:00 1 g via INTRAVENOUS
  Filled 2019-06-19 (×2): qty 1

## 2019-06-19 MED ORDER — SUCROFERRIC OXYHYDROXIDE 500 MG PO CHEW
250.0000 mg | CHEWABLE_TABLET | Freq: Three times a day (TID) | ORAL | Status: DC
  Filled 2019-06-19 (×3): qty 0.5

## 2019-06-19 MED ORDER — PHENYLEPHRINE HCL-NACL 10-0.9 MG/250ML-% IV SOLN
25.0000 ug/min | INTRAVENOUS | Status: DC
  Administered 2019-06-19: 13:00:00 200 ug/min via INTRAVENOUS
  Administered 2019-06-19: 12:00:00 35 ug/min via INTRAVENOUS
  Administered 2019-06-19 (×3): 200 ug/min via INTRAVENOUS
  Filled 2019-06-19: qty 500
  Filled 2019-06-19 (×3): qty 250
  Filled 2019-06-19: qty 500
  Filled 2019-06-19: qty 250

## 2019-06-19 MED FILL — Medication: Qty: 1 | Status: AC

## 2019-06-19 NOTE — Evaluation (Signed)
Clinical/Bedside Swallow Evaluation Patient Details  Name: Gregory [name] MRN: [id number] Date of Birth: 1951/02/08  Today's Date: 06/19/2019 Time: SLP Start Time (ACUTE ONLY): 1194 SLP Stop Time (ACUTE ONLY): 1732 SLP Time Calculation (min) (ACUTE ONLY): 12 min  Past Medical History:  Past Medical History:  Diagnosis Date  . Anemia in chronic kidney disease   . Arthritis    "hands; basically all my joints" (11/08/2014)  . CHF (congestive heart failure) ([place])   . Chronic back pain    "mostly lower" (11/08/2014)  . End stage renal disease ([place])    pt does not urinate; pt. states that he does dialysis on MWF; [place]" (11/08/2014)  . GERD (gastroesophageal reflux disease)    "sometimes" (11/08/2014)  . Gout    "hands" (11/08/2014)  . Heart failure with reduced ejection fraction ([place])   . Hypertension    Past Surgical History:  Past Surgical History:  Procedure Laterality Date  . AV FISTULA PLACEMENT Right 07/26/2009  . EMBOLIZATION Left 11/08/2014   internal iliac artery; Gore Excluder bifurcated stent graft for repair of common iliac aneurysm  . ENDOVASCULAR STENT INSERTION Bilateral 11/08/2014   Procedure: REPAIR OF BILATERAL ILIAC ARTERY ANEURYSM WITH  Bifurcated stent, with coiling left internal artery;  Surgeon: Elam Dutch, MD;  Location: [place];  Service: Vascular;  Laterality: Bilateral;  . ILIAC ARTERY ANEURYSM REPAIR Right 07/27/2017  . INSERTION OF DIALYSIS CATHETER N/A 02/22/2013   Procedure: INSERTION OF DIALYSIS CATHETER;  Surgeon: Elam Dutch, MD;  Location: [place];  Service: Vascular;  Laterality: N/A;  . LIGATION OF ARTERIOVENOUS  FISTULA Right 17/40/8144   Procedure: PLICATION OF ARTERIOVENOUS  FISTULA;  Surgeon: Elam Dutch, MD;  Location: [place];  Service: Vascular;  Laterality: Right;  . REPAIR ILIAC ARTERY Right 07/27/2017   Procedure: REPAIR ILIAC ARTERY ANEURYSM;  Surgeon: Elam Dutch, MD;  Location: [facility] OR;  Service: Vascular;  Laterality: Right;   . REVISON OF ARTERIOVENOUS FISTULA Right 81/85/6314   Procedure: PLICATION OF RIGHT ARM  ARTERIOVENOUS FISTULA;  Surgeon: Elam Dutch, MD;  Location: [facility] OR;  Service: Vascular;  Laterality: Right;   HPI:  69 year old male with past medical history significant for ESRD with M/W/F dialysis anemia, heart failure, hypertension, iliac aneurysm status post stent grafting repair, known celiac and superior mesenteric artery aneurysms who has had 2 days of l lethargy barely getting out of bed and confusion.  Work-up suggest sepsis without clear source.  CXR on 2/14 reported: "Cardiomegaly.  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    Assessment / Plan / Recommendation Clinical Impression  Pt was seen for a bedside swallow evaluation. He was lethargic throughout this evaluation and he was unable to follow most verbal commands, therefore oral mechanism exam was not completed.  No verbalizations were observed.  Pt consumed trials of ice chips, thin liquid via tsp and siphoned straw sip, and puree.  He exhibited reduced labial opening when spoon and straw were presented and reduced lingual manipulation was observed with all PO trials.  Pt was unable to draw liquid from the straw independently but he accepted it passively via siphoned straw sip.  Suspect delayed swallow initiation with all trials.  No overt s/sx aspiration were observed with any trials, but mild-moderate oral residue was observed in the bilateral buccal cavities following small puree trial and it was subsequently suctioned from the pt's oral cavity.  Due to lethargy and current mentation, recommend continuation of NPO with  medications administered via alternative means.  Pt may have a few small ice chips or sips of water PRN as tolerated following thorough oral care given full RN supervision.  SLP will f/u to determine readiness for clinical diet initiation vs instrumental swallow study.    SLP Visit  Diagnosis: Dysphagia, unspecified (R13.10)    Aspiration Risk  Mild aspiration risk    Diet Recommendation NPO   Liquid Administration via: Spoon Medication Administration: Via alternative means Supervision: Full supervision/cueing for compensatory strategies Compensations: Small sips/bites Postural Changes: Seated upright at 90 degrees    Other  Recommendations Oral Care Recommendations: Oral care QID;Oral care prior to ice chip/H20;Staff/trained caregiver to provide oral care Other Recommendations: Remove water pitcher   Follow up Recommendations Other (comment)(TBD)      Frequency and Duration min 2x/week  2 weeks       Prognosis Prognosis for Safe Diet Advancement: Fair      Swallow Study   General Date of Onset: 06/19/19 HPI: 69 year old male with past medical history significant for ESRD with M/W/F dialysis anemia, heart failure, hypertension, iliac aneurysm status post stent grafting repair, known celiac and superior mesenteric artery aneurysms who has had 2 days of l lethargy barely getting out of bed and confusion.  Work-up suggest sepsis without clear source.  CXR on 2/14 reported: "Cardiomegaly.  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  Type of Study: Bedside Swallow Evaluation Previous Swallow Assessment: None documented  Diet Prior to this Study: NPO Temperature Spikes Noted: Yes Respiratory Status: Room air History of Recent Intubation: No Behavior/Cognition: Lethargic/Drowsy;Doesn't follow directions;Requires cueing Oral Cavity Assessment: Other (comment)(Unable to evaluate) Oral Care Completed by SLP: No Oral Cavity - Dentition: Other (Comment)(Unable to evaluate) Self-Feeding Abilities: Total assist Patient Positioning: Partially reclined Baseline Vocal Quality: Not observed Volitional Cough: Cognitively unable to elicit Volitional Swallow: Unable to elicit    Oral/Motor/Sensory Function Overall Oral  Motor/Sensory Function: Other (comment)(Unable to evaluate)   Ice Chips Ice chips: Impaired Presentation: Spoon Oral Phase Impairments: Poor awareness of bolus;Reduced labial seal Oral Phase Functional Implications: Prolonged oral transit Pharyngeal Phase Impairments: Suspected delayed Swallow   Thin Liquid Thin Liquid: Impaired Presentation: Spoon;Straw Oral Phase Impairments: Reduced lingual movement/coordination Oral Phase Functional Implications: Prolonged oral transit Pharyngeal  Phase Impairments: Suspected delayed Swallow    Nectar Thick Nectar Thick Liquid: Not tested   Honey Thick Honey Thick Liquid: Not tested   Puree Puree: Impaired Presentation: Spoon Oral Phase Impairments: Reduced lingual movement/coordination Oral Phase Functional Implications: Prolonged oral transit;Oral residue;Right lateral sulci pocketing;Left lateral sulci pocketing Pharyngeal Phase Impairments: Suspected delayed Swallow   Solid     Solid: Not tested     Colin Mulders M.S., CCC-SLP Acute Rehabilitation Services Office: [phone]  Elvia Collum Tayson Schnelle 06/19/2019,5:39 PM

## 2019-06-19 NOTE — H&P (Signed)
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  Work-up suggest sepsis without clear source.  Blood pressure borderline after 30 cc/kg IV fluids therefore PCCM asked to admit.  History of present illness   Mr. 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  His wife reports that he is normally alert and oriented, but seemed to be "spaced out" and was having a hard time answering questions.  He had no URI symptoms, fever, diarrhea, known ill contacts or obvious focal neuro deficits.  He missed dialysis on 2/13 and when he was not improving his wife called EMS.  Work-up in the ED revealed leukocytosis of 25k, thrombocytopenia, lactic acid of 3.2, bilirubin 2.4 with normal LFTs.  He received 3.5 L IV fluids and empiric cefepime, vancomycin, Flagyl after which his blood pressure remained borderline and mental status only slightly improved.   Patient will answer questions and mentions left-sided back pain, denies abdominal pain. CT head was ordered and PCCM consulted for admission.  Covid-19 negative.  Past Medical History   has a past medical history of Anemia in chronic kidney disease, Arthritis, CHF (congestive heart failure) ([place]), Chronic back pain, End stage renal disease ([place]), GERD (gastroesophageal reflux disease), Gout,  Heart failure with reduced ejection fraction ([place]), and Hypertension. celiac and superior mesenteric artery aneurysms   Significant Hospital Events   2/14 Admit to PCCM  Consults:  nephrology  Procedures:    Significant Diagnostic Tests:  2/14 CXR>>Cardiomegaly.  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 Chronic elevation of the left hemidiaphragm with chronic volume loss at the left lung base.  Micro Data:  2/14 respiratory viral panel>> negative 2/14 blood cultures x2>>  Antimicrobials:  Flagyl 2/14 only Cefepime 2/14- Vancomycin 2/14-  Interim history/subjective:  Patient was initially very hypotensive with systolic in the 99I on arrival to the ED, this improved with IV fluids though blood pressure still soft.  Objective   Blood pressure 94/67, pulse (!) 128, temperature 100.1 F (37.8 C), temperature source Rectal, resp. rate 19, SpO2 98 %.        Intake/Output Summary (Last 24 hours) at 06/19/2019 0434 Last data filed at 06/19/2019 0351 Gross per 24 hour  Intake 7885.3 ml  Output --  Net 7885.3 ml   There were no vitals filed for this visit. General: Elderly, thin, poorly nourished male in no acute distress HEENT: MM pink/dry Neuro: Awake and alert, follows commands though slowly oriented to person only, moving all extremities, no facial droop, delayed answering all questions with one-word answers CV: s1s2 tachycardic, regular, no m/r/g PULM: Clear to auscultation bilaterally GI: soft, bsx4 active, left-sided flank pain, mild Extremities: warm/dry, no edema, right arm aneurysmal brachiocephalic fistula noted Skin: no rashes or lesions   Resolved Hospital Problem list     Assessment & Plan:   Sepsis physiology without clear source and at high risk to  progress to septic shock -Lactic acid 3.2, received 30 cc/kg IV fluid P: -Continue empiric broad antibiotic coverage with vancomycin and cefepime -Obtain CT abdomen pelvis to evaluate for  intra-abdominal source -Given tachycardia with heart rate up to 140 after IV fluids we will also obtain CT chest to evaluate for PE -Follow blood cultures -Home medications list prednisone 5 mg daily, however it is not clear if patient is currently taking this -Check cortisol level and TSH   Encephalopathy -Baseline alert and oriented, generalized lethargy and confusion without focal neuro deficits P: -Possibly metabolic versus secondary to sepsis, check CT head -Prior CT scanning notes cirrhotic liver, check ammonia level  End-stage renal disease -Goes to [facility] dialysis M/W/F -Missed dialysis on 2/13 P: -Hyperkalemic in the ED, however specimen was hemolyzed -Check repeat metabolic panel -We will need nephrology consult -bicarb bolus   Hypertension, HFpEF -Not on any home antihypertensives -Last echo in 2018 with EF of 55-60% and stage I diastolic failure P: -Check echocardiogram to evaluate for worsening heart failure   Thrombocytopenia -Could be secondary to gram-negative sepsis, on chart review CT abdomen pelvis done in January 2021 notes enhancing lesion of the left kidney concerning for small renal cell carcinoma -No active bleeding P:: -Check DIC panel -Transfuse for platelets less than 10K -Follow repeat CBC   Malnutrition -Wife notes very poor appetite for the last several days -Albumin 2.1 P: -Obtain speech evaluation prior to initiating diet, concerned that he is at high risk for aspiration currently -Albumin 25 g x 1 -Check mag and Phos  Celiac and superior mesenteric artery aneurysms -On CT scanning 05/2019 aneurysmaldilatation of bilateral common iliac arteries again noted. Left measures 3.5 cm in diameter, R 3.3 cm. -Follows outpatient with vascular surgery P: -pt notes L back pain today, follow CT results  Best practice:  Diet: N.p.o. Pain/Anxiety/Delirium protocol (if indicated): N/A VAP protocol (if indicated): N/A DVT prophylaxis:  Heparin, SCDs GI prophylaxis: N/A Glucose control: N/A Mobility: Bedrest Code Status: Full code, confirmed with wife Family Communication: Wife Mickel Baas updated by telephone Disposition: ICU  Labs   CBC: Recent Labs  Lab 06/19/19 0000  WBC 25.7*  NEUTROABS 23.4*  HGB 13.1  HCT 38.3*  MCV 87.2  PLT 17*    Basic Metabolic Panel: Recent Labs  Lab 06/19/19 0000  NA 135  K 6.1*  CL 95*  CO2 22  GLUCOSE 92  BUN 89*  CREATININE 9.78*  CALCIUM 8.0*   GFR: CrCl cannot be calculated (Unknown ideal weight.). Recent Labs  Lab 06/19/19 0000  WBC 25.7*  LATICACIDVEN 3.2*    Liver Function Tests: Recent Labs  Lab 06/19/19 0000  AST 32  ALT 9  ALKPHOS 136*  BILITOT 2.4*  PROT 5.8*  ALBUMIN 2.1*   No results for input(s): LIPASE, AMYLASE in the last 168 hours. No results for input(s): AMMONIA in the last 168 hours.  ABG    Component Value Date/Time   PHART 7.441 11/02/2014 0900   PCO2ART 42.6 11/02/2014 0900   PO2ART 83.9 11/02/2014 0900   HCO3 28.5 (H) 11/02/2014 0900   TCO2 21 11/08/2014 0735   O2SAT 96.1 11/02/2014 0900     Coagulation Profile: Recent Labs  Lab 06/19/19 0000  INR 1.2    Cardiac Enzymes: No results for input(s): CKTOTAL, CKMB, CKMBINDEX, TROPONINI in the last 168 hours.  HbA1C: No results found for: HGBA1C  CBG: No results for input(s): GLUCAP in the last 168 hours.  Review of Systems:   Unable to  obtain secondary to encephalopathy  Past Medical History  He,  has a past medical history of Anemia in chronic kidney disease, Arthritis, CHF (congestive heart failure) ([place]), Chronic back pain, End stage renal disease ([place]), GERD (gastroesophageal reflux disease), Gout, Heart failure with reduced ejection fraction ([place]), and Hypertension.   Surgical History    Past Surgical History:  Procedure Laterality Date  . AV FISTULA PLACEMENT Right 07/26/2009  . EMBOLIZATION Left 11/08/2014   internal iliac artery; Gore Excluder bifurcated  stent graft for repair of common iliac aneurysm  . ENDOVASCULAR STENT INSERTION Bilateral 11/08/2014   Procedure: REPAIR OF BILATERAL ILIAC ARTERY ANEURYSM WITH  Bifurcated stent, with coiling left internal artery;  Surgeon: Elam Dutch, MD;  Location: [place];  Service: Vascular;  Laterality: Bilateral;  . ILIAC ARTERY ANEURYSM REPAIR Right 07/27/2017  . INSERTION OF DIALYSIS CATHETER N/A 02/22/2013   Procedure: INSERTION OF DIALYSIS CATHETER;  Surgeon: Elam Dutch, MD;  Location: [place];  Service: Vascular;  Laterality: N/A;  . LIGATION OF ARTERIOVENOUS  FISTULA Right 08/65/7846   Procedure: PLICATION OF ARTERIOVENOUS  FISTULA;  Surgeon: Elam Dutch, MD;  Location: [place];  Service: Vascular;  Laterality: Right;  . REPAIR ILIAC ARTERY Right 07/27/2017   Procedure: REPAIR ILIAC ARTERY ANEURYSM;  Surgeon: Elam Dutch, MD;  Location: [facility] OR;  Service: Vascular;  Laterality: Right;  . REVISON OF ARTERIOVENOUS FISTULA Right 96/29/5284   Procedure: PLICATION OF RIGHT ARM  ARTERIOVENOUS FISTULA;  Surgeon: Elam Dutch, MD;  Location: [place];  Service: Vascular;  Laterality: Right;     Social History   reports that he has never smoked. He has never used smokeless tobacco. He reports that he does not drink alcohol or use drugs.   Family History   His family history includes Diabetes in his father and sister; Hypertension in his father.   Allergies No Known Allergies   Home Medications  Prior to Admission medications   Medication Sig Start Date End Date Taking? Authorizing Provider  atorvastatin (LIPITOR) 40 MG tablet Take 40 mg by mouth daily.    [provider]  B Complex-C-Folic Acid (DIALYVITE 132 PO) Take 1 tablet by mouth every Monday, Wednesday, and Friday with hemodialysis. After dialysis.    [provider]  calcium carbonate (OS-CAL) 1250 (500 Ca) MG chewable tablet Chew 2 tablets by mouth at bedtime.    [provider]  cinacalcet (SENSIPAR)  60 MG tablet Take 60 mg by mouth daily after supper.    [provider]  colchicine 0.6 MG tablet Take 0.6 mg by mouth daily as needed (for gout flare ups.).  12/11/14   [provider]  oxyCODONE-acetaminophen (PERCOCET) 10-325 MG tablet Take 1 tablet by mouth 4 (four) times daily.  05/09/16   [provider]  predniSONE (DELTASONE) 5 MG tablet Take 5 mg by mouth daily.  12/11/14   [provider]  promethazine (PHENERGAN) 25 MG tablet Take 25 mg by mouth every 6 (six) hours as needed for nausea or vomiting.    [provider]  sucroferric oxyhydroxide (VELPHORO) 500 MG chewable tablet Chew 250 mg by mouth 3 (three) times daily with meals.     [provider]     Critical care time: 65 minutes    CRITICAL CARE Performed by: Otilio Carpen Mickelle Goupil   Total critical care time: 65 minutes  Critical care time was exclusive of separately billable procedures and treating other patients.  Critical care was necessary  to treat or prevent imminent or life-threatening deterioration.  Critical care was time spent personally by me on the following activities: development of treatment plan with patient and/or surrogate as well as nursing, discussions with consultants, evaluation of patient's response to treatment, examination of patient, obtaining history from patient or surrogate, ordering and performing treatments and interventions, ordering and review of laboratory studies, ordering and review of radiographic studies, pulse oximetry and re-evaluation of patient's condition.   Otilio Carpen Collette Pescador, PA-C

## 2019-06-19 NOTE — ED Notes (Signed)
Will ask Phlebotomist Tyron to draw pt blood.

## 2019-06-19 NOTE — Progress Notes (Signed)
Pharmacy Antibiotic Note  Gregory [name] is a 69 y.o. male admitted on 06/17/2019 with weakness/hypotension.  Pharmacy has been consulted for Vancomycin/Cefepime dosing for r/o sepsis. WBC is elevated. Pt has ESRD on HD MWF.  Plan: Vancomycin 1750 mg IV x 1, then 1000 mg IV qHD MWF Cefepime 1g IV q24h Trend WBC, temp, HD schedule F/U infectious work-up Drug levels as indicated  Temp (24hrs), Avg:99.7 F (37.6 C), Min:99.3 F (37.4 C), Max:100.1 F (37.8 C)  Recent Labs  Lab 06/19/19 0000  WBC 25.7*  CREATININE 9.78*  LATICACIDVEN 3.2*    CrCl cannot be calculated (Unknown ideal weight.).    No Known Allergies  Narda Bonds, PharmD, BCPS Clinical Pharmacist Phone: [phone]

## 2019-06-19 NOTE — Progress Notes (Signed)
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  Work-up suggest sepsis without clear source.  Blood pressure borderline after 30 cc/kg IV fluids therefore PCCM asked to admit.  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  His wife reports that he is normally alert and oriented, but seemed to be "spaced out" and was having a hard time answering questions.  He had no URI symptoms, fever, diarrhea, known ill contacts or obvious focal neuro deficits.  He missed dialysis on 2/13 and when he was not improving his wife called EMS.  Work-up in the ED revealed leukocytosis of 25k, thrombocytopenia, lactic acid of 3.2, bilirubin 2.4 with normal LFTs.  He received 3.5 L IV fluids and empiric cefepime, vancomycin, Flagyl after which his blood pressure remained borderline and mental status only slightly improved.   Patient will answer questions and mentions left-sided back pain, denies abdominal pain. CT head was ordered and PCCM consulted for admission.  Covid-19 negative.  Past Medical History   has a past medical history of Anemia in chronic kidney disease, Arthritis, CHF (congestive heart failure) ([place]), Chronic back pain, End stage renal disease ([place]), GERD (gastroesophageal reflux disease), Gout,  Heart failure with reduced ejection fraction ([place]), and Hypertension. celiac and superior mesenteric artery aneurysms   Significant Hospital Events   2/14 Admit to PCCM  Consults:  nephrology  Procedures:    Significant Diagnostic Tests:  2/14 CXR>>Cardiomegaly.  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 Chronic elevation of the left hemidiaphragm with chronic volume loss at the left lung base.  Micro Data:  2/14 respiratory viral panel>> negative 2/14 blood cultures x2>>  Antimicrobials:  Flagyl 2/14 only Cefepime 2/14- Vancomycin 2/14-  Interim history/subjective:  Events of the morning reviewed.  He developed SVT, was hemodynamically unstable, treated initially with adenosine but ultimately required DCCV around 715. Has remained hypotensive, received albumin and now another 2 L IV fluid resuscitation (5 L total). Interacting but confused, still not back to baseline per wife at bedside   Objective   Blood pressure (!) 75/51, pulse 96, temperature 98.2 F (36.8 C), temperature source Oral, resp. rate (!) 22, height 6\' 4"  (1.93 m), weight 72.8 kg, SpO2 100 %.        Intake/Output Summary (Last 24 hours) at 06/19/2019 1152 Last data filed at 06/19/2019 1100 Gross per 24 hour  Intake 9673.77 ml  Output --  Net 9673.77 ml   Filed Weights   06/19/19 0523  Weight: 72.8 kg   General: Thin elderly man, laying in bed in no distress HEENT: Oropharynx somewhat dry Neuro: He is awake, alert, interacts but oriented only to self.  Slow to respond to some questions, does not respond at all to others.  No meningismus CV: Regular, heart rate 110s PULM: Clear bilaterally, no wheeze, no crackles GI: Nondistended, nontender with  moderate palpation, positive bowel sounds Extremities: Large right upper extremity fistula with good thrill, no edema Skin: No rash   Resolved Hospital Problem list     Assessment & Plan:   Shock, presumed septic shock without clear source.   Consider cardiogenic.  No evidence of hemorrhage -Lactic acid 3.2 >> 2.5 with resuscitation P: Given his history of renal abnormality, no clear infectious source planning for CT scan abdomen.  Also CT chest to rule out pulmonary embolism given hemodynamic instability Cultures drawn and pending Empiric broad-spectrum antibiotics as ordered, tailor to culture data, CT data Prednisone 5 mg daily on his home medicine list, question whether he is taking this.  Start stress dose steroids 2/14 Start midodrine 2/14 Start low-dose phenylephrine given his failure to respond to IV fluids.  Initially through peripheral IV, may need to place HD catheter both for pressors and to facilitate HD 2/15 given his shock   Encephalopathy -Baseline alert and oriented, generalized lethargy and confusion without focal neuro deficits P: Suspect secondary to underlying process, infection, sepsis, hypotension.  He missed HD on 2/13 so consider contribution of his uremia Head CT pending Ammonia level pending  SVT, presumed secondary to underlying process Required cardioversion Started amiodarone this morning, continue infusion  End-stage renal disease -Goes to [facility] dialysis M/W/F -Missed dialysis on 2/13 P: [place] nephrology assistance Plan for HD per their recommendations, schedule.  Does not appear to have an urgent need at this time although he is uremic, may be contributing to his mental status.  May require CVVHD depending on blood pressure  History of left renal lesion, question renal cell CA P: CT abdomen pending  Hypertension, HFpEF -Not on any home antihypertensives -Last echo in 2018 with EF of 55-60% and stage I diastolic failure P: Echocardiogram pending Antihypertensive regimen on hold   Thrombocytopenia -Could be secondary to gram-negative sepsis, on chart review CT abdomen pelvis done in January 2021 notes enhancing lesion of the left kidney concerning for small renal cell  carcinoma.  Consider DIC, consider TTP -No active bleeding P:: DIC panel ordered, need to establish whether he has schistocytes Follow for any evidence of bleeding, follow CBC Would transfuse platelets if less than 10K   Malnutrition -Wife notes very poor appetite for the last several days -Albumin 2.1 P: Likely needs SLP evaluation before initiating p.o. diet   Celiac and superior mesenteric artery aneurysms -On CT scanning 05/2019 aneurysmaldilatation of bilateral common iliac arteries again noted. Left measures 3.5 cm in diameter, R 3.3 cm. -Follows outpatient with vascular surgery P: CT abdomen to assess for interval change pending  Best practice:  Diet: N.p.o. Pain/Anxiety/Delirium protocol (if indicated): N/A VAP protocol (if indicated): N/A DVT prophylaxis: Heparin, SCDs GI prophylaxis: N/A Glucose control: N/A Mobility: Bedrest Code Status: Full code, confirmed with wife Family Communication: Wife Mickel Baas updated at bedside Disposition: ICU  Labs   CBC: Recent Labs  Lab 06/19/19 0000 06/19/19 0556  WBC 25.7* 19.2*  NEUTROABS 23.4*  --   HGB 13.1 13.0  HCT 38.3* 37.2*  MCV 87.2 85.7  PLT 17* 13*  13*    Basic Metabolic Panel: Recent Labs  Lab 06/19/19 0000 06/19/19 0556  NA 135 135  K 6.1* 4.7  CL 95* 97*  CO2 22 20*  GLUCOSE 92 100*  BUN 89* 86*  CREATININE 9.78* 9.14*  CALCIUM 8.0* 8.1*  MG  --  1.8  PHOS  --  2.3*   GFR: Estimated Creatinine Clearance: 7.7 mL/min (A) (by C-G formula  based on SCr of 9.14 mg/dL (H)). Recent Labs  Lab 06/19/19 0000 06/19/19 0556  WBC 25.7* 19.2*  LATICACIDVEN 3.2* 2.5*    Liver Function Tests: Recent Labs  Lab 06/19/19 0000  AST 32  ALT 9  ALKPHOS 136*  BILITOT 2.4*  PROT 5.8*  ALBUMIN 2.1*   No results for input(s): LIPASE, AMYLASE in the last 168 hours. Recent Labs  Lab 06/19/19 0556  AMMONIA 35    ABG    Component Value Date/Time   PHART 7.441 11/02/2014 0900   PCO2ART 42.6  11/02/2014 0900   PO2ART 83.9 11/02/2014 0900   HCO3 28.5 (H) 11/02/2014 0900   TCO2 21 11/08/2014 0735   O2SAT 96.1 11/02/2014 0900     Coagulation Profile: Recent Labs  Lab 06/19/19 0000 06/19/19 0556  INR 1.2 1.2    Cardiac Enzymes: No results for input(s): CKTOTAL, CKMB, CKMBINDEX, TROPONINI in the last 168 hours.  HbA1C: No results found for: HGBA1C  CBG: Recent Labs  Lab 06/19/19 0603 06/19/19 0804  GLUCAP 95 84     Critical care time: 40 minutes    Baltazar Apo, MD, PhD 06/19/2019, 12:07 PM [facility] [phone] or if no answer [phone]

## 2019-06-19 NOTE — ED Notes (Signed)
UTO lactic acid. RN and phlebotomy attempted.

## 2019-06-19 NOTE — ED Notes (Addendum)
Pt has received total of 3.25 L of fluids. Remaining 500 mL to infuse discontinued per Dr. Marlowe Sax due to concern for fluid overload.

## 2019-06-19 NOTE — Progress Notes (Signed)
[facility] Progress Note Patient Name: Gregory [name] DOB: Jul 17, 1950 MRN: [id number]   Date of Service  06/19/2019  HPI/Events of Note  Lactic Acid = 3.2.   eICU Interventions  Will bolus with 0.9 NaCl 500 mL IV over 30 minutes now.      Intervention Category Major Interventions: Acid-Base disturbance - evaluation and management  [name] 06/19/2019, 5:47 AM

## 2019-06-19 NOTE — Procedures (Signed)
Hemodialysis Catheter Insertion Procedure Note Gregory [name] [id number] Mar 17, 1951  Procedure: Insertion of Hemodialysis Catheter Indications: Dialysis Access   Procedure Details Consent: Risks of procedure as well as the alternatives and risks of each were explained to the (patient/caregiver).  Consent for procedure obtained. Time Out: Verified patient identification, verified procedure, site/side was marked, verified correct patient position, special equipment/implants available, medications/allergies/relevent history reviewed, required imaging and test results available.  Performed  Maximum sterile technique was used including antiseptics, cap, gloves, gown, hand hygiene, mask and sheet. Skin prep: Chlorhexidine; local anesthetic administered Triple lumen hemodialysis catheter was inserted into left internal jugular vein using the Seldinger technique.  Evaluation Blood flow good Complications: No apparent complications Patient did tolerate procedure well. Chest X-ray ordered to verify placement.  CXR: pending.   [name] 06/19/2019

## 2019-06-19 NOTE — Progress Notes (Signed)
  Echocardiogram 2D Echocardiogram has been performed.  Michiel Cowboy 06/19/2019, 10:19 AM

## 2019-06-19 NOTE — Plan of Care (Signed)
Plan of care   Called to bedside as patient with Tachycardia. No P waves discernable. Given adeonsine x2 with conversion. However patient still tachycardic to 200. Given this he was cardioverted with 120 Joules with conversion to sinus tachycardia. Given 25 mcg of fentanyl.   Newell Coral DO Internal Medicine/Pediatrics Pulmonary and Critical Care Fellow PGY-6

## 2019-06-19 NOTE — Progress Notes (Signed)
SLP Cancellation Note  Patient Details Name: Gregory [name] MRN: [id number] DOB: 05-24-1950   Cancelled treatment:       Reason Eval/Treat Not Completed: Patient at procedure or test/unavailable; Pt is currently having lines placed.  SLP will f/u for BSE as schedule allows.    Elvia Collum Areatha Kalata 06/19/2019, 5:01 PM

## 2019-06-19 NOTE — Procedures (Signed)
Arterial Catheter Insertion Procedure Note Gregory [name] [id number] [birth year]  Procedure: Insertion of Arterial Catheter  Indications: Blood pressure monitoring  Procedure Details Consent: Risks of procedure as well as the alternatives and risks of each were explained to the (patient/caregiver).  Consent for procedure obtained. Time Out: Verified patient identification, verified procedure, site/side was marked, verified correct patient position, special equipment/implants available, medications/allergies/relevent history reviewed, required imaging and test results available.  Performed  Maximum sterile technique was used including antiseptics, cap, gloves, gown, hand hygiene and mask. Skin prep: Chlorhexidine; local anesthetic administered 22 gauge catheter was inserted into left radial artery using the Seldinger technique. ULTRASOUND GUIDANCE USED: YES Evaluation Blood flow good; BP tracing good. Complications: No apparent complications.   [name] 06/19/2019

## 2019-06-19 NOTE — Consult Note (Signed)
[facility] Renal Consultation Note    Indication for Consultation:  Management of ESRD/hemodialysis; anemia, hypertension/volume and secondary hyperparathyroidism  HPI: Gregory [name] is a 69 y.o. 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 HD initiated 12/2012. 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   He is admitted for sepsis work-up. Presented to the ED yesterday with 2 day history of lethargy and confusion. Hypotensive and tachycardic on arrival. Temp 99.40F.  Labs significant for leukocytosis WBC 25.7, K 6.1, lactic acid 3.2, thrombocytopenia platelets 17k. COVID/Flu testing negative. CXR showed pulmonary venous hypertension without frank edema. So far has received 3.5L fluid bolus. Calcium gluconate dosed. Blood cultures drawn, empiric antibiotics started. Orders for head, abd CT.   Seen and examined in ICU. Remains hypotensive after fluid bolus. HR 80s-90s. SBPs 60s on monitor. Noted SVT earlier this am s/p adenosine, cardioversion. He is lethargic and unable to provide any history. Attempted to call wife, Mickel Baas, for information, but no answer at either number.   Dialyzes MWF at [facility]. Last dialysis 2/10. His dry weight was lowered to 73kg. Post HD BP usually 90s-100s  Past Medical History:  Diagnosis Date  . Anemia in chronic kidney disease   . Arthritis    "hands; basically all my joints" (11/08/2014)  . CHF (congestive heart failure) ([place])   . Chronic back pain    "mostly lower" (11/08/2014)  . End stage renal disease ([place])    pt does not urinate; pt. states that he does dialysis on MWF; [employer]" (11/08/2014)  . GERD (gastroesophageal reflux disease)    "sometimes" (11/08/2014)  . Gout    "hands" (11/08/2014)  . Heart failure with reduced ejection fraction ([place])   . Hypertension    Past Surgical History:  Procedure Laterality Date   . AV FISTULA PLACEMENT Right 07/26/2009  . EMBOLIZATION Left 11/08/2014   internal iliac artery; Gore Excluder bifurcated stent graft for repair of common iliac aneurysm  . ENDOVASCULAR STENT INSERTION Bilateral 11/08/2014   Procedure: REPAIR OF BILATERAL ILIAC ARTERY ANEURYSM WITH  Bifurcated stent, with coiling left internal artery;  Surgeon: Elam Dutch, MD;  Location: [place];  Service: Vascular;  Laterality: Bilateral;  . ILIAC ARTERY ANEURYSM REPAIR Right 07/27/2017  . INSERTION OF DIALYSIS CATHETER N/A 02/22/2013   Procedure: INSERTION OF DIALYSIS CATHETER;  Surgeon: Elam Dutch, MD;  Location: [place];  Service: Vascular;  Laterality: N/A;  . LIGATION OF ARTERIOVENOUS  FISTULA Right 40/98/1191   Procedure: PLICATION OF ARTERIOVENOUS  FISTULA;  Surgeon: Elam Dutch, MD;  Location: [place];  Service: Vascular;  Laterality: Right;  . REPAIR ILIAC ARTERY Right 07/27/2017   Procedure: REPAIR ILIAC ARTERY ANEURYSM;  Surgeon: Elam Dutch, MD;  Location: [facility] OR;  Service: Vascular;  Laterality: Right;  . REVISON OF ARTERIOVENOUS FISTULA Right 47/82/9562   Procedure: PLICATION OF RIGHT ARM  ARTERIOVENOUS FISTULA;  Surgeon: Elam Dutch, MD;  Location: [facility] OR;  Service: Vascular;  Laterality: Right;   Family History  Problem Relation Age of Onset  . Diabetes Father   . Hypertension Father   . Diabetes Sister    Social History:  reports that he has never smoked. He has never used smokeless tobacco. He reports that he does not drink alcohol or use drugs. No Known Allergies Prior to Admission medications   Medication Sig Start Date End Date Taking? Authorizing Provider  atorvastatin (LIPITOR) 40 MG  tablet Take 40 mg by mouth daily.    [provider]  B Complex-C-Folic Acid (DIALYVITE 376 PO) Take 1 tablet by mouth every Monday, Wednesday, and Friday with hemodialysis. After dialysis.    [provider]  calcium carbonate (OS-CAL) 1250 (500 Ca) MG chewable  tablet Chew 2 tablets by mouth at bedtime.    [provider]  cinacalcet (SENSIPAR) 60 MG tablet Take 60 mg by mouth daily after supper.    [provider]  colchicine 0.6 MG tablet Take 0.6 mg by mouth daily as needed (for gout flare ups.).  12/11/14   [provider]  oxyCODONE-acetaminophen (PERCOCET) 10-325 MG tablet Take 1 tablet by mouth 4 (four) times daily.  05/09/16   [provider]  predniSONE (DELTASONE) 5 MG tablet Take 5 mg by mouth daily.  12/11/14   [provider]  promethazine (PHENERGAN) 25 MG tablet Take 25 mg by mouth every 6 (six) hours as needed for nausea or vomiting.    [provider]  sucroferric oxyhydroxide (VELPHORO) 500 MG chewable tablet Chew 250 mg by mouth 3 (three) times daily with meals.     [provider]   Current Facility-Administered Medications  Medication Dose Route Frequency Provider Last Rate Last Admin  . amiodarone (NEXTERONE PREMIX) 360-4.14 MG/200ML-% (1.8 mg/mL) IV infusion  60 mg/hr Intravenous Continuous [name], MD 33.3 mL/hr at 06/19/19 1100 60 mg/hr at 06/19/19 1100   Followed by  . amiodarone (NEXTERONE PREMIX) 360-4.14 MG/200ML-% (1.8 mg/mL) IV infusion  30 mg/hr Intravenous Continuous [name], MD      . ceFEPIme (MAXIPIME) 1 g in sodium chloride 0.9 % 100 mL IVPB  1 g Intravenous Q24H Gleason, Otilio Carpen, PA-C      . Chlorhexidine Gluconate Cloth 2 % PADS 6 each  6 each Topical Q0600 Tyna Jaksch, MD   6 each at 06/19/19 0539  . cinacalcet (SENSIPAR) tablet 90 mg  90 mg Oral QPC supper Gleason, Otilio Carpen, PA-C      . dextrose 50 % solution 50 mL  1 ampule Intravenous Once Gleason, Otilio Carpen, PA-C      . diltiazem (CARDIZEM) 125 mg in dextrose 5% 125 mL (1 mg/mL) infusion  5-15 mg/hr Intravenous Titrated Gleason, Otilio Carpen, PA-C      . hydrocortisone sodium succinate (SOLU-CORTEF) 100 MG injection 50 mg  50 mg Intravenous Q6H Tyna Jaksch, MD      . insulin aspart  (novoLOG) injection 5 Units  5 Units Intravenous Once Gleason, Otilio Carpen, PA-C      . metoprolol tartrate (LOPRESSOR) 5 MG/5ML injection           . midodrine (PROAMATINE) tablet 10 mg  10 mg Oral TID WC [name], MD   10 mg at 06/19/19 1042  . sucroferric oxyhydroxide (VELPHORO) chewable tablet 250 mg  250 mg Oral TID WC Gleason, Otilio Carpen, PA-C      . Thrombi-Pad 3"X3" pad 1 each  1 each Topical Once Gleason, Otilio Carpen, PA-C      . [START ON 06/20/2019] vancomycin (VANCOCIN) IVPB 1000 mg/200 mL premix  1,000 mg Intravenous Q M,W,F-HD Gleason, Otilio Carpen, PA-C         ROS: Unable to obtain d/t lethargy   Physical Exam: Vitals:   06/19/19 1030 06/19/19 1045 06/19/19 1100 06/19/19 1115  BP: (!) 74/39 (!) 70/40 (!) 66/40 (!) 75/51  Pulse: 88 94 89 96  Resp: 12 18 20  (!)  22  Temp:      TempSrc:      SpO2: 100% 100% 100% 100%  Weight:      Height:         General: Ill appearing male, nad,  Head: NCAT sclera not icteric Neck: Supple. No JVD  Lungs: CTA bilaterally without wheezes, rales, or rhonchi. Breathing is unlabored. Heart: RRR with S1 S2 Abdomen: +BS soft non-tender  Lower extremities: no LE edema; chronic skin changes, no open wounds, cool dusky toes Neuro: Lethargic, but moves extremities  Psych:  Altered, unable to answer questions  Dialysis Access: RUE AVF +bruit, aneurysmal   Labs: Basic Metabolic Panel: Recent Labs  Lab 06/19/19 0000 06/19/19 0556  NA 135 135  K 6.1* 4.7  CL 95* 97*  CO2 22 20*  GLUCOSE 92 100*  BUN 89* 86*  CREATININE 9.78* 9.14*  CALCIUM 8.0* 8.1*  PHOS  --  2.3*   Liver Function Tests: Recent Labs  Lab 06/19/19 0000  AST 32  ALT 9  ALKPHOS 136*  BILITOT 2.4*  PROT 5.8*  ALBUMIN 2.1*   No results for input(s): LIPASE, AMYLASE in the last 168 hours. Recent Labs  Lab 06/19/19 0556  AMMONIA 35   CBC: Recent Labs  Lab 06/19/19 0000 06/19/19 0556  WBC 25.7* 19.2*  NEUTROABS 23.4*  --   HGB 13.1 13.0  HCT 38.3* 37.2*  MCV  87.2 85.7  PLT 17* 13*  13*   Cardiac Enzymes: No results for input(s): CKTOTAL, CKMB, CKMBINDEX, TROPONINI in the last 168 hours. CBG: Recent Labs  Lab 06/19/19 0603 06/19/19 0804  GLUCAP 95 84   Iron Studies: No results for input(s): IRON, TIBC, TRANSFERRIN, FERRITIN in the last 72 hours. Studies/Results: DG Chest Port 1 View  Result Date: 06/19/2019 CLINICAL DATA:  Fever.  Lethargic.  Dialysis patient. EXAM: PORTABLE CHEST 1 VIEW COMPARISON:  07/27/2017 FINDINGS: Chronic cardiomegaly and aortic atherosclerosis. 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 Chronic elevation of the left hemidiaphragm with chronic left base volume loss. No acute bone finding. Previous surgical clips at the thoracic inlet. IMPRESSION: Cardiomegaly.  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 Chronic elevation of the left hemidiaphragm with chronic volume loss at the left lung base. Electronically Signed   By: [place] Chimes M.D.   On: 06/19/2019 00:38   ECHOCARDIOGRAM COMPLETE  Result Date: 06/19/2019    ECHOCARDIOGRAM REPORT   Patient Name:   JGUADALUPE OPIELA Date of Exam: 06/19/2019 Medical Rec #:  [id number]       Height:       76.0 in Accession #:    [id number]      Weight:       160.5 lb Date of Birth:  1950-11-28        BSA:          2.02 m Patient Age:    27 years        BP:           67/45 mmHg Patient Gender: M               HR:           95 bpm. Exam Location:  Inpatient Procedure: 2D Echo, Cardiac Doppler and Color Doppler Indications:    Hypotension  History:        Patient has prior history of Echocardiogram examinations, most                 recent 05/15/2016. Risk Factors:Hypertension and Non-Smoker.  Sonographer:    Vickie Epley RDCS Referring Phys: [id number] [place]  1. Left ventricular ejection fraction, by estimation, is 60 to 65%. The left ventricle has normal function. The left ventricle has no regional wall motion abnormalities. Left ventricular diastolic parameters  are indeterminate.  2. Right ventricular systolic function is normal. The right ventricular size is normal.  3. Left atrial size was severely dilated.  4. Right atrial size was mildly dilated.  5. The mitral valve is degenerative. Mild mitral valve regurgitation. No evidence of mitral stenosis.  6. The aortic valve is tricuspid. Aortic valve regurgitation is mild to moderate. Mild aortic valve stenosis.  7. The inferior vena cava is normal in size with greater than 50% respiratory variability, suggesting right atrial pressure of 3 mmHg. FINDINGS  Left Ventricle: Left ventricular ejection fraction, by estimation, is 60 to 65%. The left ventricle has normal function. The left ventricle has no regional wall motion abnormalities. The left ventricular internal cavity size was normal in size. There is  no left ventricular hypertrophy. Left ventricular diastolic parameters are indeterminate. Right Ventricle: The right ventricular size is normal. No increase in right ventricular wall thickness. Right ventricular systolic function is normal. Left Atrium: Left atrial size was severely dilated. Right Atrium: Right atrial size was mildly dilated. Pericardium: There is no evidence of pericardial effusion. Mitral Valve: The mitral valve is degenerative in appearance. There is moderate thickening of the mitral valve leaflet(s). There is moderate calcification of the mitral valve leaflet(s). Normal mobility of the mitral valve leaflets. Severe mitral annular  calcification. Mild mitral valve regurgitation. No evidence of mitral valve stenosis. Tricuspid Valve: The tricuspid valve is normal in structure. Tricuspid valve regurgitation is trivial. No evidence of tricuspid stenosis. Aortic Valve: The aortic valve is tricuspid. . There is severe thickening and severe calcifcation of the aortic valve. Aortic valve regurgitation is mild to moderate. Aortic regurgitation PHT measures 201 msec. Mild aortic stenosis is present. Severe aortic  valve annular calcification. There is severe thickening of the aortic valve. There is severe calcifcation of the aortic valve. Aortic valve mean gradient measures 12.3 mmHg. Aortic valve peak gradient measures 19.7 mmHg. Aortic valve area, by VTI measures 2.13 cm. Pulmonic Valve: The pulmonic valve was normal in structure. Pulmonic valve regurgitation is mild. No evidence of pulmonic stenosis. Aorta: The aortic root is normal in size and structure. Venous: The inferior vena cava is normal in size with greater than 50% respiratory variability, suggesting right atrial pressure of 3 mmHg. IAS/Shunts: No atrial level shunt detected by color flow Doppler.  LEFT VENTRICLE PLAX 2D LVIDd:         5.60 cm      Diastology LVIDs:         4.20 cm      LV e' lateral:   7.62 cm/s LV PW:         0.60 cm      LV E/e' lateral: 21.8 LV IVS:        0.60 cm      LV e' medial:    7.29 cm/s LVOT diam:     2.40 cm      LV E/e' medial:  22.8 LV SV:         99.98 ml LV SV Index:   38.29 LVOT Area:     4.52 cm  LV Volumes (MOD) LV vol d, MOD A2C: 148.0 ml LV vol d, MOD A4C: 140.0 ml LV vol s, MOD A2C: 74.7  ml LV vol s, MOD A4C: 71.9 ml LV SV MOD A2C:     73.3 ml LV SV MOD A4C:     140.0 ml LV SV MOD BP:      72.3 ml RIGHT VENTRICLE RV S prime:     15.70 cm/s TAPSE (M-mode): 1.9 cm LEFT ATRIUM              Index       RIGHT ATRIUM           Index LA diam:        5.40 cm  2.68 cm/m  RA Area:     26.60 cm LA Vol (A2C):   164.0 ml 81.29 ml/m RA Volume:   82.00 ml  40.65 ml/m LA Vol (A4C):   156.0 ml 77.33 ml/m LA Biplane Vol: 165.0 ml 81.79 ml/m  AORTIC VALVE AV Area (Vmax):    1.91 cm AV Area (Vmean):   1.81 cm AV Area (VTI):     2.13 cm AV Vmax:           222.00 cm/s AV Vmean:          166.667 cm/s AV VTI:            0.469 m AV Peak Grad:      19.7 mmHg AV Mean Grad:      12.3 mmHg LVOT Vmax:         93.80 cm/s LVOT Vmean:        66.700 cm/s LVOT VTI:          0.221 m LVOT/AV VTI ratio: 0.47 AI PHT:            201 msec  AORTA Ao  Root diam: 3.40 cm MITRAL VALVE MV Area (PHT): 6.27 cm     SHUNTS MV Decel Time: 121 msec     Systemic VTI:  0.22 m MV E velocity: 166.00 cm/s  Systemic Diam: 2.40 cm MV A velocity: 49.40 cm/s MV E/A ratio:  3.36 Jenkins Rouge MD Electronically signed by Jenkins Rouge MD Signature Date/Time: 06/19/2019/11:06:09 AM    Final     Dialysis Orders:  [place] M[facility] 3.75h 400/800 EDW 73kg 2K/3.5Ca UFP 4   AVF Hep 3300 No VDRA/ESA  OP Labs 06/10/19: Hgb 12.5 Ca 8.0 Phos 3.9 Alb 3.4  PTH 1743  Assessment/Plan: 1. Sepsis/AMS - Etiology unclear. PCCM following. Leukocytosis/thrombocytopenia on admission. Covid/Flu testing negative. CXR wi/o frank edema/infiltrates. Blood cultures pending. CT Head, Abd pending. Empiric antibiotics started.  2. ESRD -  HD MWF. Last HD 2/10. Temporizing meds in HD for hyperkalemia. K+ 4.7 this am. No urgent HD needs today - getting fluids/abx. Allow to stabilize. Reassess for HD in the am if BP allows.  3. Hypotension/volume  - Remains hypotensive. On midodrine. Getting another 2L fluid bolus (already received 3.5L + albumin).  4. Anemia  - Hgb 13. No ESA needed.  5. Metabolic bone disease -  Ca ok. Low Phos. Hold Velphoro binder for now. On Sensipar.    Lynnda Child PA-C [facility] Pager [phone] 06/19/2019, 11:36 AM

## 2019-06-19 NOTE — Progress Notes (Addendum)
PHARMACY - PHYSICIAN COMMUNICATION CRITICAL VALUE ALERT - BLOOD CULTURE IDENTIFICATION (BCID)  No results found for this or any previous visit.   Lab called with [facility] 4/4 [place]  Name of physician (or Provider) Contacted: none  Changes to prescribed antibiotics required: none needed currently on Cefepime 1gm q24h   Bonnita Nasuti Pharm.D. CPP, BCPS Clinical Pharmacist [phone] 06/19/2019 4:41 PM

## 2019-06-19 NOTE — Progress Notes (Signed)
Pt arrived to ICU from ED. Per report from ED, IV team did not think another iv was necessary for ct scans. Brought patient here. Pt ST in 150s on arrival and sustained. Elink notified, ekg obtained. Results of ekg are ST w short PR and reported to critical care PA. Pt awake, lethargic, aox1, takes time to respond. Indicates no pain. On Room air w good sats. See flowsheet for full pt assessment. Pt currently has 2 L PIVs, and LUE fistula w bruit and thrill. Skin intact. Per pt, he makes no urine. Bed alarm on. Phlebotomy came to draw labs. Pt was a very difficult stick. PA at bedside for aline placement.Pt belongings include t shirt and socks.

## 2019-06-19 NOTE — Progress Notes (Addendum)
6 mg Adenosine given w MD Orpah Melter and crash cart at bedside with pads on. Additional 12 mg adenosine given. Remained in SVT. Pt given 11mcg fentanyl on verbal order, followed by cardioversion, while remaining on cardiac monitor with pads on and crash cart at bedside. Report given to oncoming nurse during this.

## 2019-06-20 ENCOUNTER — Inpatient Hospital Stay (HOSPITAL_COMMUNITY): Payer: Medicare Other

## 2019-06-20 ENCOUNTER — Other Ambulatory Visit: Payer: Self-pay

## 2019-06-20 LAB — CBC
HCT: 32.4 % — ABNORMAL LOW (ref 39.0–52.0)
Hemoglobin: 11.1 g/dL — ABNORMAL LOW (ref 13.0–17.0)
MCH: 29.1 pg (ref 26.0–34.0)
MCHC: 34.3 g/dL (ref 30.0–36.0)
MCV: 85 fL (ref 80.0–100.0)
Platelets: 15 10*3/uL — CL (ref 150–400)
RBC: 3.81 MIL/uL — ABNORMAL LOW (ref 4.22–5.81)
RDW: 15.4 % (ref 11.5–15.5)
WBC: 26.6 10*3/uL — ABNORMAL HIGH (ref 4.0–10.5)
nRBC: 0 % (ref 0.0–0.2)

## 2019-06-20 LAB — MAGNESIUM: Magnesium: 1.8 mg/dL (ref 1.7–2.4)

## 2019-06-20 LAB — BPAM PLATELET PHERESIS
Blood Product Expiration Date: 202102162359
Blood Product Expiration Date: 202102162359
ISSUE DATE / TIME: 202102141326
ISSUE DATE / TIME: 202102141442
Unit Type and Rh: 6200
Unit Type and Rh: 6200

## 2019-06-20 LAB — BASIC METABOLIC PANEL
Anion gap: 17 — ABNORMAL HIGH (ref 5–15)
BUN: 85 mg/dL — ABNORMAL HIGH (ref 8–23)
CO2: 17 mmol/L — ABNORMAL LOW (ref 22–32)
Calcium: 8 mg/dL — ABNORMAL LOW (ref 8.9–10.3)
Chloride: 103 mmol/L (ref 98–111)
Creatinine, Ser: 8.7 mg/dL — ABNORMAL HIGH (ref 0.61–1.24)
GFR calc Af Amer: 6 mL/min — ABNORMAL LOW (ref >60)
GFR calc non Af Amer: 6 mL/min — ABNORMAL LOW (ref >60)
Glucose, Bld: 131 mg/dL — ABNORMAL HIGH (ref 70–99)
Potassium: 5.2 mmol/L — ABNORMAL HIGH (ref 3.5–5.1)
Sodium: 137 mmol/L (ref 135–145)

## 2019-06-20 LAB — PROTIME-INR
INR: 1.5 — ABNORMAL HIGH (ref 0.8–1.2)
Prothrombin Time: 17.5 seconds — ABNORMAL HIGH (ref 11.4–15.2)

## 2019-06-20 LAB — GLUCOSE, CAPILLARY
Glucose-Capillary: 112 mg/dL — ABNORMAL HIGH (ref 70–99)
Glucose-Capillary: 126 mg/dL — ABNORMAL HIGH (ref 70–99)
Glucose-Capillary: 130 mg/dL — ABNORMAL HIGH (ref 70–99)
Glucose-Capillary: 137 mg/dL — ABNORMAL HIGH (ref 70–99)
Glucose-Capillary: 99 mg/dL (ref 70–99)

## 2019-06-20 LAB — PREPARE PLATELET PHERESIS
Unit division: 0
Unit division: 0

## 2019-06-20 LAB — LACTIC ACID, PLASMA: Lactic Acid, Venous: 2.8 mmol/L (ref 0.5–1.9)

## 2019-06-20 LAB — RENAL FUNCTION PANEL
Albumin: 2.1 g/dL — ABNORMAL LOW (ref 3.5–5.0)
Anion gap: 18 — ABNORMAL HIGH (ref 5–15)
BUN: 71 mg/dL — ABNORMAL HIGH (ref 8–23)
CO2: 18 mmol/L — ABNORMAL LOW (ref 22–32)
Calcium: 7.8 mg/dL — ABNORMAL LOW (ref 8.9–10.3)
Chloride: 103 mmol/L (ref 98–111)
Creatinine, Ser: 6.42 mg/dL — ABNORMAL HIGH (ref 0.61–1.24)
GFR calc Af Amer: 9 mL/min — ABNORMAL LOW (ref >60)
GFR calc non Af Amer: 8 mL/min — ABNORMAL LOW (ref >60)
Glucose, Bld: 143 mg/dL — ABNORMAL HIGH (ref 70–99)
Phosphorus: 3.8 mg/dL (ref 2.5–4.6)
Potassium: 5.2 mmol/L — ABNORMAL HIGH (ref 3.5–5.1)
Sodium: 139 mmol/L (ref 135–145)

## 2019-06-20 LAB — PHOSPHORUS: Phosphorus: 4.4 mg/dL (ref 2.5–4.6)

## 2019-06-20 LAB — HEPATIC FUNCTION PANEL
ALT: 14 U/L (ref 0–44)
AST: 26 U/L (ref 15–41)
Albumin: 1.9 g/dL — ABNORMAL LOW (ref 3.5–5.0)
Alkaline Phosphatase: 126 U/L (ref 38–126)
Bilirubin, Direct: 0.8 mg/dL — ABNORMAL HIGH (ref 0.0–0.2)
Indirect Bilirubin: 0.9 mg/dL (ref 0.3–0.9)
Total Bilirubin: 1.7 mg/dL — ABNORMAL HIGH (ref 0.3–1.2)
Total Protein: 5.1 g/dL — ABNORMAL LOW (ref 6.5–8.1)

## 2019-06-20 LAB — LACTATE DEHYDROGENASE: LDH: 209 U/L — ABNORMAL HIGH (ref 98–192)

## 2019-06-20 LAB — FIBRINOGEN: Fibrinogen: 436 mg/dL (ref 210–475)

## 2019-06-20 MED ORDER — PRISMASOL BGK 4/2.5 32-4-2.5 MEQ/L REPLACEMENT SOLN
Status: DC
  Filled 2019-06-20 (×21): qty 5000

## 2019-06-20 MED ORDER — ATROPINE SULFATE 1 MG/10ML IJ SOSY
PREFILLED_SYRINGE | INTRAMUSCULAR | Status: AC
  Filled 2019-06-20: qty 10

## 2019-06-20 MED ORDER — SODIUM CHLORIDE 0.9 % IV SOLN
1.0000 g | Freq: Three times a day (TID) | INTRAVENOUS | Status: DC
  Administered 2019-06-20 – 2019-06-21 (×3): 1 g via INTRAVENOUS
  Filled 2019-06-20 (×5): qty 1

## 2019-06-20 MED ORDER — SODIUM CHLORIDE 0.9 % FOR CRRT
INTRAVENOUS_CENTRAL | Status: DC | PRN
  Filled 2019-06-20 (×2): qty 1000

## 2019-06-20 MED ORDER — SODIUM ZIRCONIUM CYCLOSILICATE 5 G PO PACK
10.0000 g | PACK | Freq: Two times a day (BID) | ORAL | Status: DC
  Administered 2019-06-20: 10 g via ORAL
  Filled 2019-06-20: qty 2

## 2019-06-20 MED ORDER — PRISMASOL BGK 4/2.5 32-4-2.5 MEQ/L REPLACEMENT SOLN
Status: DC
  Filled 2019-06-20 (×13): qty 5000

## 2019-06-20 MED ORDER — NOREPINEPHRINE 16 MG/250ML-% IV SOLN
0.0000 ug/min | INTRAVENOUS | Status: DC
  Administered 2019-06-20: 12 ug/min via INTRAVENOUS
  Administered 2019-06-21: 8 ug/min via INTRAVENOUS
  Filled 2019-06-20 (×2): qty 250

## 2019-06-20 MED ORDER — PRISMASOL BGK 4/2.5 32-4-2.5 MEQ/L IV SOLN
INTRAVENOUS | Status: DC
  Filled 2019-06-20 (×63): qty 5000

## 2019-06-20 NOTE — Progress Notes (Addendum)
Lab called and reported that the I&O cath specimen was stool and they can not run it as a urine specimen. I reported this to Dr Hollie Salk and [place] student

## 2019-06-20 NOTE — Progress Notes (Signed)
Pharmacy Antibiotic Note  Gregory [name] is a 69 y.o. male admitted on 06/17/2019 with weakness/hypotension.  Pharmacy has been consulted for Vancomycin/Cefepime dosing for r/o sepsis.   WBC is elevated and rising. LA elevated. Afebrile. Requiring multiple pressors.  Pt has ESRD on HD MWF outpatient >> now transitioning to CRRT.   *Discussed with team - will broaden gram negative coverage to Merrem until culture results return and discontinue Vancomycin for now.   Plan: Merrem 1g IV every 8 hours while on CRRT.  Discontinue Vancomycin. Trend WBC, temp, CRRT.  F/U infectious work-up  Temp (24hrs), Avg:98 F (36.7 C), Min:97.6 F (36.4 C), Max:98.6 F (37 C)  Recent Labs  Lab 06/19/19 0000 06/19/19 0556 06/19/19 1822 06/20/19 0312  WBC 25.7* 19.2*  --  26.6*  CREATININE 9.78* 9.14* 8.11* 8.70*  LATICACIDVEN 3.2* 2.5*  --  2.8*    Estimated Creatinine Clearance: 8.1 mL/min (A) (by C-G formula based on SCr of 8.7 mg/dL (H)).    No Known Allergies  Sloan Leiter, PharmD, BCPS, BCCCP Clinical Pharmacist Please refer to [facility] for [place] numbers 06/20/2019, 9:07 AM

## 2019-06-20 NOTE — Progress Notes (Signed)
K+ 5.2 reported to Dr Carolynne Edouard

## 2019-06-20 NOTE — Progress Notes (Signed)
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  Sepsis suspected, patient had in-out urinary cath which produced stool. On norepi, phenylephrine, and vasopressin.  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  His wife reports that he is normally alert and oriented, but seemed to be "spaced out" and was having a hard time answering questions.  He had no URI symptoms, fever, diarrhea, known ill contacts or obvious focal neuro deficits.  He missed dialysis on 2/13 and when he was not improving his wife called EMS.  Work-up in the ED revealed leukocytosis of 25k, thrombocytopenia, lactic acid of 3.2, bilirubin 2.4 with normal LFTs.  He received 3.5 L IV fluids and empiric cefepime, vancomycin, Flagyl after which his blood pressure remained borderline and mental status only slightly improved.   Patient will answer questions and mentions left-sided back pain, denies abdominal pain. CT head was ordered and PCCM consulted for admission.  Covid-19 negative.  Past Medical History   has a past medical history of Anemia in chronic kidney disease, Arthritis, CHF (congestive heart failure) ([place]), Chronic back pain, End stage renal disease ([place]), GERD (gastroesophageal reflux disease), Gout, Heart failure  with reduced ejection fraction ([place]), and Hypertension. celiac and superior mesenteric artery aneurysms   Significant Hospital Events   2/14 Admit to [facility] 2/14 He developed SVT, was hemodynamically unstable, treated initially with adenosine but ultimately required DCCV around 715. Has remained hypotensive, received albumin and now another 2 L IV fluid resuscitation (5 L total). Interacting but confused, still not back to baseline per wife at bedside 2/15 Stool produced from in-out urinary cath   Consults:  nephrology  Procedures:  2/14 left IJ HD cath insertion  Significant Diagnostic Tests:  2/14 CXR>>Cardiomegaly.  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 Chronic elevation of the left hemidiaphragm with chronic volume loss at the left lung base.  2/14 CT Abd IMPRESSION: 1. No acute pulmonary embolism. 2. Small bilateral pleural effusions with adjacent atelectasis. 3. Dilated main pulmonary artery, which may be secondary to pulmonary arterial hypertension. 4. Interval development of wedge-shaped defects involving the spleen, which may represent splenic infarcts. 5. Cholelithiasis with mild gallbladder wall thickening and pericholecystic free fluid, which may be secondary to underlying liver disease. If there is clinical concern for acute cholecystitis, follow-up with ultrasound is recommended. 6. 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 7. 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 8. Additional chronic findings as detailed above. 9. Aortic Atherosclerosis (ICD10-I70.0).  CT Head 2/14 IMPRESSION: No acute intracranial finding. Atrophy. Chronic small-vessel ischemic changes. 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  Echo 2/14 . Left  ventricular ejection fraction, by estimation,  is 60 to 65%. The  left ventricle has normal function. The left ventricle has no regional  wall motion abnormalities. Left ventricular diastolic parameters are  indeterminate.  2. Right ventricular systolic function is normal. The right ventricular  size is normal.  3. Left atrial size was severely dilated.  4. Right atrial size was mildly dilated.  5. The mitral valve is degenerative. Mild mitral valve regurgitation. No  evidence of mitral stenosis.  6. The aortic valve is tricuspid. Aortic valve regurgitation is mild to  moderate. Mild aortic valve stenosis.  7. The inferior vena cava is normal in size with greater than 50%  respiratory variability, suggesting right atrial pressure of 3 mmHg.   Micro Data:  2/14 respiratory viral panel>> negative 2/14 blood cultures x2>> + for gram (-) Enterobacteriacae  Antimicrobials:  Flagyl 2/14 only Cefepime 2/14- Vancomycin 2/14-  Interim history/subjective:  Urine collected could not be analyzed because it is stool. Pressor requirements decreasing.   Objective   Blood pressure (!) 134/51, pulse 88, temperature 97.7 F (36.5 C), temperature source Oral, resp. rate 20, height 6\' 4"  (1.93 m), weight 72.9 kg, SpO2 100 %.        Intake/Output Summary (Last 24 hours) at 06/20/2019 0842 Last data filed at 06/20/2019 0800 Gross per 24 hour  Intake 6519.59 ml  Output --  Net 6519.59 ml   Filed Weights   06/19/19 0523 06/20/19 0345  Weight: 72.8 kg 72.9 kg   General: Thin elderly man, laying in bed in no distress.  HEENT: Mucus membranes moist, HD cath in place left IJ Neuro: He is awake, alert, interacts but is slow to respond to questions and occasionally inappropriate CV: Regular rate and rhythmn, heart rate upper 80s PULM: Clear bilaterally, no wheeze, no crackles GI: Nondistended, nontender with palpation, positive bowel sounds Extremities: Large right upper extremity fistula with  good thrill, no edema, feet cool bilaterally but DP pulses intact Skin: Apparent chronic changes bilateral LE   Resolved Hospital Problem list     Assessment & Plan:   Septic shock, gram (-) sepsis -Lactic acid 3.2 >> 2.5 with resuscitation. Gallbladder wall thickening suggests possible cholecystitis. UA collection showed stool in urine. Blood cultures show Enterobacteriacae family P: -Obtain RUQ U/S -Peel off vanc, switch from cefepime to meropenem -CT abd/pelv with rectal contrast to eval for fistula, consider urology consult if negative -Continue levofed, and vasopressin. Taper phenylephrine -Prednisone 5 mg daily on his home medicine list, question whether he is taking this.  Start stress dose steroids 2/14 -Start midodrine 2/14   Encephalopathy Baseline alert and oriented, generalized lethargy and confusion without focal neuro deficits P: Suspect secondary to underlying process, infection, sepsis, hypotension.  He missed HD on 2/13 so consider contribution of his uremia. Heat CT showed no acute intracranial abnormality. Ammonia 35. -Start CRRT today  SVT, presumed secondary to underlying process -Required cardioversion -Off amiodarone -Monitor rate and rhythm  End-stage renal disease -Goes to [facility] dialysis M/W/F -Missed dialysis on 2/13 P: Appreciate nephrology assistance Starting CRRT today   History of left renal lesion, question renal cell CA P: CT abdomen shows hyperdense 1.2 lesion   Hypertension, HFpEF -Not on any home antihypertensives -Echo showed EF 60-65% w/ indeterminate diastolic function P: Antihypertensive regimen on hold  Thrombocytopenia Could be secondary to gram-negative sepsis, on chart review CT abdomen pelvis done in January 2021 notes enhancing lesion of the left kidney concerning for small renal cell carcinoma.  Considered DIC, consider TTP however no schistocytes  found on smear. Baseline plt >100. No signs of active  bleeding P: DIC panel negative for schistocytes Follow for any evidence of bleeding, follow CBC Would transfuse platelets if less than 10K  Malnutrition -Wife notes very poor appetite for the last several days -Albumin 2.1 -SLP noted dysphagia 1 P: Can initiate dysphagia 1 diet  Celiac and superior mesenteric artery aneurysms -On CT scanning 05/2019 aneurysmaldilatation of bilateral common iliac arteries again noted. Left measures 3.5 cm in diameter, R 3.3 cm. -Follows outpatient with vascular surgery P: CT abdomen to assess for interval change pending  Best practice:  Diet: N.p.o. Pain/Anxiety/Delirium protocol (if indicated): N/A VAP protocol (if indicated): N/A DVT prophylaxis: Heparin, SCDs GI prophylaxis: N/A Glucose control: N/A Mobility: Bedrest Code Status: Full code, confirmed with wife Family Communication: Wife Mickel Baas updated at bedside Disposition: ICU  Labs   CBC: Recent Labs  Lab 06/19/19 0000 06/19/19 0556 06/20/19 0312  WBC 25.7* 19.2* 26.6*  NEUTROABS 23.4*  --   --   HGB 13.1 13.0 11.1*  HCT 38.3* 37.2* 32.4*  MCV 87.2 85.7 85.0  PLT 17* 13*  13* 15*    Basic Metabolic Panel: Recent Labs  Lab 06/19/19 0000 06/19/19 0556 06/19/19 1822 06/20/19 0312  NA 135 135 139 137  K 6.1* 4.7 4.5 5.2*  CL 95* 97* 102 103  CO2 22 20* 20* 17*  GLUCOSE 92 100* 104* 131*  BUN 89* 86* 82* 85*  CREATININE 9.78* 9.14* 8.11* 8.70*  CALCIUM 8.0* 8.1* 7.8* 8.0*  MG  --  1.8  --  1.8  PHOS  --  2.3*  --  4.4   GFR: Estimated Creatinine Clearance: 8.1 mL/min (A) (by C-G formula based on SCr of 8.7 mg/dL (H)). Recent Labs  Lab 06/19/19 0000 06/19/19 0556 06/20/19 0312  WBC 25.7* 19.2* 26.6*  LATICACIDVEN 3.2* 2.5* 2.8*    Liver Function Tests: Recent Labs  Lab 06/19/19 0000 06/20/19 0312  AST 32 26  ALT 9 14  ALKPHOS 136* 126  BILITOT 2.4* 1.7*  PROT 5.8* 5.1*  ALBUMIN 2.1* 1.9*   No results for input(s): LIPASE, AMYLASE in the last 168  hours. Recent Labs  Lab 06/19/19 0556  AMMONIA 35    ABG    Component Value Date/Time   PHART 7.404 06/19/2019 1738   PCO2ART 34.6 06/19/2019 1738   PO2ART 63.3 (L) 06/19/2019 1738   HCO3 21.2 06/19/2019 1738   TCO2 21 11/08/2014 0735   ACIDBASEDEF 2.8 (H) 06/19/2019 1738   O2SAT 90.7 06/19/2019 1738     Coagulation Profile: Recent Labs  Lab 06/19/19 0000 06/19/19 0556 06/20/19 0312  INR 1.2 1.2 1.5*    Cardiac Enzymes: No results for input(s): CKTOTAL, CKMB, CKMBINDEX, TROPONINI in the last 168 hours.  HbA1C: No results found for: HGBA1C  CBG: Recent Labs  Lab 06/19/19 1554 06/19/19 2003 06/20/19 0037 06/20/19 0449 06/20/19 0801  GLUCAP 89 100* 112* 99 126*     Critical care time: 40 minutes

## 2019-06-20 NOTE — Progress Notes (Signed)
I&O cath per orders for 500 cc of green purulent foul smelling urine . Specimen sent to lab per orders

## 2019-06-20 NOTE — Progress Notes (Signed)
New episodes of heart rate changes noted. HR would be sustaining in the 90's and suddenly drops in the 60's and 70's, sometimes in the 50's but quickly recovers. A-line BP also slight drops when those episodes occur. He is on 3 pressors currently. Patient is laying in bed comfortably and able to respond to name and follow commands has been confused since admission. E-link made aware.

## 2019-06-20 NOTE — Progress Notes (Signed)
  [facility] Progress Note   Assessment/ Plan:   Dialysis Orders:  [facility] M[facility] 3.75h 400/800 EDW 73kg 2K/3.5Ca UFP 4   AVF Hep 3300 No VDRA/ESA  OP Labs 06/10/19: Hgb 12.5 Ca 8.0 Phos 3.9 Alb 3.4  PTH 1743  Assessment/Plan: 1. Septic shock - Blood cultures + for Enterobacter species, has likely splenic infarcts on CT abd/ pelvis and distended bladder, ? Urinary source (but splenic infarcts may suggest embolic phenomenon so ? If needs eval for endocarditis). PCCM following. Leukocytosis/thrombocytopenia on admission. Covid/Flu testing negative. CXR wi/o frank edema/infiltrates. On broad spectrum abx and pressors.  Bladder scan and I/O cath if needed. 2. AMS: CT head negative, has aneurysmal dilatation of bilateral cervical ICAs.   3. Thrombocytopenia: plts down to 13 and 15, has no schistocytes on smear, LDH and haptoglobin pending, likely sepsis related, Tbili mildly elevated, think TTP/HUS less likely 4. ESRD -  HD MWF. Last HD 2/10. On 3 pressors and don't think that they will come off before need for RRT- has already trialysis catheter, will start CRRT today with no anticoagulant, net neg 50 mL/ hr if possible 5. Hypotension/volume  - Remains hypotensive. On midodrine, pressors, stress dose steroids 6. Anemia  - Hgb 13. No ESA needed.  7. Metabolic bone disease -  Ca ok. Low Phos. Hold Velphoro binder for now. On Sensipar.  8. Dispo: critically ill in ICU  Subjective:    Seen in room.  Plts down to 15 this AM, enterobacter on blood cultures.  On broad-spectrum abx and 3 pressors.  CT with new possible splenic infarcts and a distended bladder   Objective:   BP (!) 134/51   Pulse 88   Temp 97.7 F (36.5 C) (Oral)   Resp 20   Ht 6\' 4"  (1.93 m)   Wt 72.9 kg   SpO2 100%   BMI 19.56 kg/m   Physical Exam: XBD:ZHGDJ in bed, appears ill CVS: RRR no m/r/g Resp: normal WOB, clear anteriorly Abd: soft, some mild suprapubic tenderness to palpation, ? fullness Ext: 1+  anasarca ACCESS: RUE AVF +T/B, aneurysmal, L IJ trialysis catheter  Labs: BMET Recent Labs  Lab 06/19/19 0000 06/19/19 0556 06/19/19 1822 06/20/19 0312  NA 135 135 139 137  K 6.1* 4.7 4.5 5.2*  CL 95* 97* 102 103  CO2 22 20* 20* 17*  GLUCOSE 92 100* 104* 131*  BUN 89* 86* 82* 85*  CREATININE 9.78* 9.14* 8.11* 8.70*  CALCIUM 8.0* 8.1* 7.8* 8.0*  PHOS  --  2.3*  --  4.4   CBC Recent Labs  Lab 06/19/19 0000 06/19/19 0556 06/20/19 0312  WBC 25.7* 19.2* 26.6*  NEUTROABS 23.4*  --   --   HGB 13.1 13.0 11.1*  HCT 38.3* 37.2* 32.4*  MCV 87.2 85.7 85.0  PLT 17* 13*  13* 15*      Medications:    . atropine      . Chlorhexidine Gluconate Cloth  6 each Topical Q0600  . cinacalcet  90 mg Oral QPC supper  . dextrose  1 ampule Intravenous Once  . hydrocortisone sod succinate (SOLU-CORTEF) inj  50 mg Intravenous Q6H  . insulin aspart  5 Units Intravenous Once  . midodrine  10 mg Oral TID WC  . sodium zirconium cyclosilicate  10 g Oral BID  . Thrombi-Pad  1 each Topical Once     Madelon Lips MD 06/20/2019, 9:01 AM

## 2019-06-20 NOTE — Progress Notes (Signed)
CRITICAL VALUE ALERT  Critical Value:  Platelets and lactic   Date & Time Notied: 2/15  05:10  Provider Notified:  E-link   Orders Received/Actions taken:  Waiting for orders

## 2019-06-20 NOTE — Progress Notes (Signed)
  Speech Language Pathology Treatment: Dysphagia  Patient Details Name: Gregory [name] MRN: [id number] DOB: [birth year] Today's Date: 06/20/2019 Time: 7116-5790 SLP Time Calculation (min) (ACUTE ONLY): 20 min  Assessment / Plan / Recommendation Clinical Impression  Pt was seen for dysphagia treatment to assess improvement in swallow function and evaluate his ability to safely tolerate a p.o. diet. Pt's level of alertness was improved compared to that which was documented yesterday and he communicated verbally with a significantly reduced processing speed. He tolerated puree, regular texture solids, and individual and consecutive swallows of thin liquids via cup and straw without overt s/sx of aspiration. Pt self-fed and rate of intake was notably reduced. Pt required cues to continue self-feeding and in the absence of cues he just held foods in his hands and stared. A moderately increased mastication time was observed but no significant oral residue was demonstrated. It is recommended that a dysphagia 1 (puree) diet with thin liquids be initiated at this time with full supervision to facilitate continued self-feeding or for assistance as needed. SLP will follow to assess further improvement in swallow function and his ability to tolerate more advanced solids.   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  Work-up suggest sepsis without clear source.  CXR on 2/14 reported: "Cardiomegaly.  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       SLP Plan  Goals updated       Recommendations  Diet recommendations: Thin liquid;Dysphagia 1 (puree) Liquids provided via: Straw;Cup Medication Administration:  Crushed with puree Supervision: Full supervision/cueing for compensatory strategies Postural Changes and/or Swallow Maneuvers: Seated upright 90 degrees                Oral Care Recommendations: Oral care BID Follow up Recommendations: Other (comment)(TBD) SLP Visit Diagnosis: Dysphagia, oral phase (R13.11) Plan: Goals updated       Gregory [name] I. Hardin Negus, [place], [place] Office number [phone] Pager [place] 06/20/2019, 10:23 AM

## 2019-06-20 NOTE — Progress Notes (Signed)
SLP Cancellation Note  Patient Details Name: Gregory [name] MRN: [id number] DOB: 1951/04/03   Cancelled treatment:       Reason Eval/Treat Not Completed: Patient at procedure or test/unavailable(Pt being bathed at this time. SLP will follow up. )  Derwin Reddy I. Hardin Negus, [facility], [place] Office number [phone] Pager [place] 06/20/2019, 9:32 AM

## 2019-06-21 ENCOUNTER — Inpatient Hospital Stay (HOSPITAL_COMMUNITY): Payer: Medicare Other

## 2019-06-21 LAB — RENAL FUNCTION PANEL
Albumin: 2 g/dL — ABNORMAL LOW (ref 3.5–5.0)
Albumin: 1.8 g/dL — ABNORMAL LOW (ref 3.5–5.0)
Anion gap: 14 (ref 5–15)
Anion gap: 13 (ref 5–15)
BUN: 56 mg/dL — ABNORMAL HIGH (ref 8–23)
BUN: 44 mg/dL — ABNORMAL HIGH (ref 8–23)
CO2: 18 mmol/L — ABNORMAL LOW (ref 22–32)
CO2: 20 mmol/L — ABNORMAL LOW (ref 22–32)
Calcium: 7.8 mg/dL — ABNORMAL LOW (ref 8.9–10.3)
Calcium: 7.9 mg/dL — ABNORMAL LOW (ref 8.9–10.3)
Chloride: 105 mmol/L (ref 98–111)
Chloride: 102 mmol/L (ref 98–111)
Creatinine, Ser: 5.24 mg/dL — ABNORMAL HIGH (ref 0.61–1.24)
Creatinine, Ser: 3.81 mg/dL — ABNORMAL HIGH (ref 0.61–1.24)
GFR calc Af Amer: 12 mL/min — ABNORMAL LOW (ref >60)
GFR calc Af Amer: 17 mL/min — ABNORMAL LOW (ref >60)
GFR calc non Af Amer: 10 mL/min — ABNORMAL LOW (ref >60)
GFR calc non Af Amer: 15 mL/min — ABNORMAL LOW (ref >60)
Glucose, Bld: 126 mg/dL — ABNORMAL HIGH (ref 70–99)
Glucose, Bld: 148 mg/dL — ABNORMAL HIGH (ref 70–99)
Phosphorus: 3.8 mg/dL (ref 2.5–4.6)
Phosphorus: 3.1 mg/dL (ref 2.5–4.6)
Potassium: 4.9 mmol/L (ref 3.5–5.1)
Potassium: 4.6 mmol/L (ref 3.5–5.1)
Sodium: 137 mmol/L (ref 135–145)
Sodium: 135 mmol/L (ref 135–145)

## 2019-06-21 LAB — CULTURE, BLOOD (ROUTINE X 2)

## 2019-06-21 LAB — MAGNESIUM: Magnesium: 2.1 mg/dL (ref 1.7–2.4)

## 2019-06-21 LAB — GLUCOSE, CAPILLARY
Glucose-Capillary: 101 mg/dL — ABNORMAL HIGH (ref 70–99)
Glucose-Capillary: 103 mg/dL — ABNORMAL HIGH (ref 70–99)
Glucose-Capillary: 108 mg/dL — ABNORMAL HIGH (ref 70–99)

## 2019-06-21 LAB — HAPTOGLOBIN: Haptoglobin: 168 mg/dL (ref 32–363)

## 2019-06-21 MED ORDER — SODIUM CHLORIDE 0.9 % IV SOLN
2.0000 g | INTRAVENOUS | Status: DC
  Administered 2019-06-21 – 2019-06-27 (×7): 2 g via INTRAVENOUS
  Filled 2019-06-21 (×8): qty 20

## 2019-06-21 MED ORDER — IOHEXOL 300 MG/ML  SOLN
100.0000 mL | Freq: Once | INTRAMUSCULAR | Status: AC | PRN
  Administered 2019-06-21: 100 mL via INTRAVENOUS

## 2019-06-21 MED ORDER — TAMSULOSIN HCL 0.4 MG PO CAPS
0.4000 mg | ORAL_CAPSULE | Freq: Every day | ORAL | Status: DC
  Administered 2019-06-21 – 2019-06-29 (×8): 0.4 mg via ORAL
  Filled 2019-06-21 (×9): qty 1

## 2019-06-21 NOTE — Progress Notes (Signed)
  [facility] Progress Note   Assessment/ Plan:   Dialysis Orders:  [facility] M[facility] 3.75h 400/800 EDW 73kg 2K/3.5Ca UFP 4   AVF Hep 3300 No VDRA/ESA  OP Labs 06/10/19: Hgb 12.5 Ca 8.0 Phos 3.9 Alb 3.4  PTH 1743  Assessment/Plan: 1. Septic shock - Blood cultures + for Enterobacter species, has likely splenic infarcts on CT abd/ pelvis and distended bladder, ? Urinary source (but splenic infarcts may suggest embolic phenomenon so ? If needs eval for endocarditis). PCCM following. Leukocytosis/thrombocytopenia on admission. Covid/Flu testing negative. CXR wi/o frank edema/infiltrates. On broad spectrum abx and pressors.  I/O cath with possible stool--> getting CT abd/ pelvis with rectal contrast today. 2. AMS: CT head negative, has aneurysmal dilatation of bilateral cervical ICAs.   3. Thrombocytopenia: plts down to 13 and 15, has no schistocytes on smear, LDH 209, haptoglobin pending, likely sepsis related, Tbili mildly elevated, think TTP/HUS less likely. 4. ESRD -  HD MWF. Last HD 2/10. Continue CRRT, with no anticoagulant, net neg 50 mL/ hr 5. Hypotension/volume  - Remains hypotensive. On midodrine, pressors, stress dose steroids 6. Anemia  - Hgb 13. No ESA needed.  7. Metabolic bone disease -  Ca ok. Low Phos. Hold Velphoro binder for now. On Sensipar.  8. Dispo: critically ill in ICU  Subjective:    Getting unhooked from CRRT to go down to CT with rectal contrast.  Sitting up in bed, attempting to eat a little breakfast   Objective:   BP 139/69   Pulse (!) 104   Temp (!) 94.1 F (34.5 C) (Axillary)   Resp 19   Ht 6\' 4"  (1.93 m)   Wt 81.2 kg   SpO2 100%   BMI 21.79 kg/m   Physical Exam: Gen: sitting in bed, appears ill CVS: RRR no m/r/g Resp: normal WOB, clear anteriorly Abd: soft, some mild suprapubic tenderness to palpation, ? fullness Ext: 1+ anasarca ACCESS: RUE AVF +T/B, aneurysmal, L IJ trialysis catheter  Labs: BMET Recent Labs  Lab 06/19/19 0000  06/19/19 0556 06/19/19 1822 06/20/19 0312 06/20/19 1600 06/21/19 0342  NA 135 135 139 137 139 137  K 6.1* 4.7 4.5 5.2* 5.2* 4.9  CL 95* 97* 102 103 103 105  CO2 22 20* 20* 17* 18* 18*  GLUCOSE 92 100* 104* 131* 143* 126*  BUN 89* 86* 82* 85* 71* 56*  CREATININE 9.78* 9.14* 8.11* 8.70* 6.42* 5.24*  CALCIUM 8.0* 8.1* 7.8* 8.0* 7.8* 7.8*  PHOS  --  2.3*  --  4.4 3.8 3.8   CBC Recent Labs  Lab 06/19/19 0000 06/19/19 0556 06/20/19 0312  WBC 25.7* 19.2* 26.6*  NEUTROABS 23.4*  --   --   HGB 13.1 13.0 11.1*  HCT 38.3* 37.2* 32.4*  MCV 87.2 85.7 85.0  PLT 17* 13*  13* 15*      Medications:    . Chlorhexidine Gluconate Cloth  6 each Topical Q0600  . cinacalcet  90 mg Oral QPC supper  . dextrose  1 ampule Intravenous Once  . hydrocortisone sod succinate (SOLU-CORTEF) inj  50 mg Intravenous Q6H  . insulin aspart  5 Units Intravenous Once  . midodrine  10 mg Oral TID WC  . Thrombi-Pad  1 each Topical Once     Madelon Lips MD 06/21/2019, 10:35 AM

## 2019-06-21 NOTE — Progress Notes (Signed)
SLP Cancellation Note  Patient Details Name: Gregory [name] MRN: [id number] DOB: [birth year]   Cancelled treatment: Pt out of room for procedure. Will continue efforts.  Tymel Conely L. Tivis Ringer, [place] Office number [phone] Pager [phone]           Juan Quam Laurice 06/21/2019, 9:57 AM

## 2019-06-21 NOTE — Consult Note (Signed)
Reason for Consult: End STage Renal Disease, Complex Urinary Infection / Sepsis, Rule Out Colovesical Fistula   Referring Physician: Gillermina Phy MD  Gregory [name] is an 69 y.o. male.   HPI:   1 - End Stage Renal Disease - on dialysis via left SCV cath for medical renal disease. CT 06/2019 with bilateral atrophic native kidneys w/o hydro. Unclear how much urine he makes at baseline.   2 - Complex Urinary Infection / Pyocystis / Sepsis - sepsis picture 06/2019, UCX GNR / pending, BCX 2/14 pan-sensitive citrobacter. CT 2/201 w/o hydro / GU absecess or fluid collections. Placed on merrem. Mildly distended bladder on imaging x several this admission. bedise foely and manual bladder aspiration 2/16 confims pyocystis (bladder essentially full of pus as he is nearly anuric).   3 -  Rule Out Colovesical Fistula  -  ?fecal material on hospital UA during admission 06/2019. CT 2/14 and rectal constrast CT 2/16 w/o gas in bladder or overt fistula. NO h/o pelvic radiaiton, inflammatory bowel disease, or GI cancer.Bedside aspiration 2/16 confrims pyocystis (mimmicked fecal material)    PMH sig for iliac anuerysm, PAD, CVA/Dementia.   Today "Gregory [name]" is seen in consultation for above.   Past Medical History:  Diagnosis Date  . Anemia in chronic kidney disease   . Arthritis    "hands; basically all my joints" (11/08/2014)  . CHF (congestive heart failure) ([place])   . Chronic back pain    "mostly lower" (11/08/2014)  . End stage renal disease ([place])    pt does not urinate; pt. states that he does dialysis on MWF; [place]" (11/08/2014)  . GERD (gastroesophageal reflux disease)    "sometimes" (11/08/2014)  . Gout    "hands" (11/08/2014)  . Heart failure with reduced ejection fraction ([place])   . Hypertension     Past Surgical History:  Procedure Laterality Date  . AV FISTULA PLACEMENT Right 07/26/2009  . EMBOLIZATION Left 11/08/2014   internal iliac artery; Gore Excluder bifurcated stent graft for repair of  common iliac aneurysm  . ENDOVASCULAR STENT INSERTION Bilateral 11/08/2014   Procedure: REPAIR OF BILATERAL ILIAC ARTERY ANEURYSM WITH  Bifurcated stent, with coiling left internal artery;  Surgeon: Elam Dutch, MD;  Location: [facility];  Service: Vascular;  Laterality: Bilateral;  . ILIAC ARTERY ANEURYSM REPAIR Right 07/27/2017  . INSERTION OF DIALYSIS CATHETER N/A 02/22/2013   Procedure: INSERTION OF DIALYSIS CATHETER;  Surgeon: Elam Dutch, MD;  Location: [facility];  Service: Vascular;  Laterality: N/A;  . LIGATION OF ARTERIOVENOUS  FISTULA Right 77/41/2878   Procedure: PLICATION OF ARTERIOVENOUS  FISTULA;  Surgeon: Elam Dutch, MD;  Location: [facility];  Service: Vascular;  Laterality: Right;  . REPAIR ILIAC ARTERY Right 07/27/2017   Procedure: REPAIR ILIAC ARTERY ANEURYSM;  Surgeon: Elam Dutch, MD;  Location: [facility] OR;  Service: Vascular;  Laterality: Right;  . REVISON OF ARTERIOVENOUS FISTULA Right 67/67/2094   Procedure: PLICATION OF RIGHT ARM  ARTERIOVENOUS FISTULA;  Surgeon: Elam Dutch, MD;  Location: [facility] OR;  Service: Vascular;  Laterality: Right;    Family History  Problem Relation Age of Onset  . Diabetes Father   . Hypertension Father   . Diabetes Sister     Social History:  reports that he has never smoked. He has never used smokeless tobacco. He reports that he does not drink alcohol or use drugs.  Allergies: No Known Allergies  Medications: I have reviewed the patient's current medications.  Results for orders placed  or performed during the hospital encounter of 06/24/2019 (from the past 48 hour(s))  Glucose, capillary     Status: None   Collection Time: 06/19/19 12:06 PM  Result Value Ref Range   Glucose-Capillary 90 70 - 99 mg/dL  Prepare Pheresed Platelets     Status: None   Collection Time: 06/19/19  1:08 PM  Result Value Ref Range   Unit Number [id number]    Blood Component Type PLTP LR2 PAS    Unit division 00    Status of Unit ISSUED,FINAL     Transfusion Status OK TO TRANSFUSE    Unit Number [id number]    Blood Component Type PLTP LR2 PAS    Unit division 00    Status of Unit ISSUED,FINAL    Transfusion Status      OK TO TRANSFUSE Performed at [facility] [address], [place], [place] [address]   Glucose, capillary     Status: None   Collection Time: 06/19/19  3:54 PM  Result Value Ref Range   Glucose-Capillary 89 70 - 99 mg/dL  Blood gas, arterial     Status: Abnormal   Collection Time: 06/19/19  5:38 PM  Result Value Ref Range   FIO2 21.00    pH, Arterial 7.404 7.350 - 7.450   pCO2 arterial 34.6 32.0 - 48.0 mmHg   pO2, Arterial 63.3 (L) 83.0 - 108.0 mmHg   Bicarbonate 21.2 20.0 - 28.0 mmol/L   Acid-base deficit 2.8 (H) 0.0 - 2.0 mmol/L   O2 Saturation 90.7 %   Patient temperature 37.0    Collection site ALINE    Drawn by [id number]    Sample type ARTERIAL    Allens test (pass/fail) PASS PASS    Comment: Performed at [facility], [place] [address] [place], [place] [address]  Type and screen [place]     Status: None   Collection Time: 06/19/19  6:22 PM  Result Value Ref Range   ABO/RH(D) O POS    Antibody Screen NEG    Sample Expiration      06/22/2019,2359 Performed at [facility], [place] [address], [place], [place] [address]   Basic metabolic panel     Status: Abnormal   Collection Time: 06/19/19  6:22 PM  Result Value Ref Range   Sodium 139 135 - 145 mmol/L   Potassium 4.5 3.5 - 5.1 mmol/L   Chloride 102 98 - 111 mmol/L   CO2 20 (L) 22 - 32 mmol/L   Glucose, Bld 104 (H) 70 - 99 mg/dL   BUN 82 (H) 8 - 23 mg/dL   Creatinine, Ser 8.11 (H) 0.61 - 1.24 mg/dL   Calcium 7.8 (L) 8.9 - 10.3 mg/dL   GFR calc non Af Amer 6 (L) >60 mL/min   GFR calc Af Amer 7 (L) >60 mL/min   Anion gap 17 (H) 5 - 15    Comment: Performed at [facility] [address] [place],  [address]  Glucose, capillary     Status: Abnormal   Collection Time: 06/19/19  8:03 PM   Result Value Ref Range   Glucose-Capillary 100 (H) 70 - 99 mg/dL   Comment 1 Notify RN   Glucose, capillary     Status: Abnormal   Collection Time: 06/20/19 12:37 AM  Result Value Ref Range   Glucose-Capillary 112 (H) 70 - 99 mg/dL   Comment 1 Notify RN   Basic metabolic panel  Status: Abnormal   Collection Time: 06/20/19  3:12 AM  Result Value Ref Range   Sodium 137 135 - 145 mmol/L   Potassium 5.2 (H) 3.5 - 5.1 mmol/L   Chloride 103 98 - 111 mmol/L   CO2 17 (L) 22 - 32 mmol/L   Glucose, Bld 131 (H) 70 - 99 mg/dL   BUN 85 (H) 8 - 23 mg/dL   Creatinine, Ser 8.70 (H) 0.61 - 1.24 mg/dL   Calcium 8.0 (L) 8.9 - 10.3 mg/dL   GFR calc non Af Amer 6 (L) >60 mL/min   GFR calc Af Amer 6 (L) >60 mL/min   Anion gap 17 (H) 5 - 15    Comment: Performed at [facility] [address], [place], [place] [address]  Magnesium     Status: None   Collection Time: 06/20/19  3:12 AM  Result Value Ref Range   Magnesium 1.8 1.7 - 2.4 mg/dL    Comment: Performed at [facility] [address] [place], [place] [address]  Phosphorus     Status: None   Collection Time: 06/20/19  3:12 AM  Result Value Ref Range   Phosphorus 4.4 2.5 - 4.6 mg/dL    Comment: Performed at [facility] [address], [place], [place] [address]  CBC     Status: Abnormal   Collection Time: 06/20/19  3:12 AM  Result Value Ref Range   WBC 26.6 (H) 4.0 - 10.5 K/uL   RBC 3.81 (L) 4.22 - 5.81 MIL/uL   Hemoglobin 11.1 (L) 13.0 - 17.0 g/dL   HCT 32.4 (L) 39.0 - 52.0 %   MCV 85.0 80.0 - 100.0 fL   MCH 29.1 26.0 - 34.0 pg   MCHC 34.3 30.0 - 36.0 g/dL   RDW 15.4 11.5 - 15.5 %   Platelets 15 (LL) 150 - 400 K/uL    Comment: REPEATED TO VERIFY Immature Platelet Fraction may be clinically indicated, consider ordering this additional test [id number] CRITICAL VALUE NOTED.  VALUE IS CONSISTENT WITH PREVIOUSLY REPORTED AND CALLED VALUE.    nRBC 0.0 0.0 - 0.2 %    Comment: Performed at [facility], [place] [address], [place], [place] [address]  Lactic acid, plasma     Status: Abnormal   Collection Time: 06/20/19  3:12 AM  Result Value Ref Range   Lactic Acid, Venous 2.8 (HH) 0.5 - 1.9 mmol/L    Comment: CRITICAL VALUE NOTED.  VALUE IS CONSISTENT WITH PREVIOUSLY REPORTED AND CALLED VALUE. Performed at [facility], [place] [address] [place], [place] [address]   Hepatic function panel     Status: Abnormal   Collection Time: 06/20/19  3:12 AM  Result Value Ref Range   Total Protein 5.1 (L) 6.5 - 8.1 g/dL   Albumin 1.9 (L) 3.5 - 5.0 g/dL   AST 26 15 - 41 U/L   ALT 14 0 - 44 U/L   Alkaline Phosphatase 126 38 - 126 U/L   Total Bilirubin 1.7 (H) 0.3 - 1.2 mg/dL   Bilirubin, Direct 0.8 (H) 0.0 - 0.2 mg/dL   Indirect Bilirubin 0.9 0.3 - 0.9 mg/dL    Comment: Performed at [facility] [address], [place], [place] [address]  Protime-INR     Status: Abnormal   Collection Time: 06/20/19  3:12 AM  Result Value Ref Range   Prothrombin Time 17.5 (H) 11.4 - 15.2 seconds   INR 1.5 (H) 0.8 -  1.2    Comment: (NOTE) INR goal varies based on device and disease states. Performed at [facility], [place] [address], [place], [place] [address]   Fibrinogen     Status: None   Collection Time: 06/20/19  3:12 AM  Result Value Ref Range   Fibrinogen 436 210 - 475 mg/dL    Comment: Performed at [facility] [address], [place], [place] [address]  Glucose, capillary     Status: None   Collection Time: 06/20/19  4:49 AM  Result Value Ref Range   Glucose-Capillary 99 70 - 99 mg/dL   Comment 1 Notify RN   Glucose, capillary     Status: Abnormal   Collection Time: 06/20/19  8:01 AM  Result Value Ref Range   Glucose-Capillary 126 (H) 70 - 99 mg/dL  Urine culture     Status: Abnormal (Preliminary result)   Collection Time: 06/20/19  9:18 AM   Specimen: In/Out Cath Urine  Result Value Ref Range   Specimen Description IN/OUT CATH URINE    Special Requests       NONE Performed at [facility], [place] [address], [place], [place] [address]    Culture >=100,000 COLONIES/mL GRAM NEGATIVE RODS (A)    Report Status PENDING   Lactate dehydrogenase     Status: Abnormal   Collection Time: 06/20/19 10:16 AM  Result Value Ref Range   LDH 209 (H) 98 - 192 U/L    Comment: Performed at [facility], [place] [address], [place], [place] [address]  Glucose, capillary     Status: Abnormal   Collection Time: 06/20/19 11:15 AM  Result Value Ref Range   Glucose-Capillary 130 (H) 70 - 99 mg/dL  Renal function panel (daily at 1600)     Status: Abnormal   Collection Time: 06/20/19  4:00 PM  Result Value Ref Range   Sodium 139 135 - 145 mmol/L   Potassium 5.2 (H) 3.5 - 5.1 mmol/L   Chloride 103 98 - 111 mmol/L   CO2 18 (L) 22 - 32 mmol/L   Glucose, Bld 143 (H) 70 - 99 mg/dL   BUN 71 (H) 8 - 23 mg/dL   Creatinine, Ser 6.42 (H) 0.61 - 1.24 mg/dL   Calcium 7.8 (L) 8.9 - 10.3 mg/dL   Phosphorus 3.8 2.5 - 4.6 mg/dL   Albumin 2.1 (L) 3.5 - 5.0 g/dL   GFR calc non Af Amer 8 (L) >60 mL/min   GFR calc Af Amer 9 (L) >60 mL/min   Anion gap 18 (H) 5 - 15    Comment: Performed at [facility], [place] [address], [place], [place] [address]  Glucose, capillary     Status: Abnormal   Collection Time: 06/20/19  8:56 PM  Result Value Ref Range   Glucose-Capillary 137 (H) 70 - 99 mg/dL  Glucose, capillary     Status: Abnormal   Collection Time: 06/21/19 12:17 AM  Result Value Ref Range   Glucose-Capillary 101 (H) 70 - 99 mg/dL  Renal function panel (daily at 0500)     Status: Abnormal   Collection Time: 06/21/19  3:42 AM  Result Value Ref Range   Sodium 137 135 - 145 mmol/L   Potassium 4.9 3.5 - 5.1 mmol/L   Chloride 105 98 - 111 mmol/L   CO2 18 (L) 22 - 32 mmol/L   Glucose, Bld 126 (H) 70 - 99 mg/dL   BUN 56 (H) 8 - 23 mg/dL  Creatinine, Ser 5.24 (H) 0.61 - 1.24 mg/dL   Calcium 7.8 (L) 8.9 - 10.3 mg/dL   Phosphorus 3.8 2.5 - 4.6 mg/dL   Albumin 2.0 (L)  3.5 - 5.0 g/dL   GFR calc non Af Amer 10 (L) >60 mL/min   GFR calc Af Amer 12 (L) >60 mL/min   Anion gap 14 5 - 15    Comment: Performed at [facility] [address] [place], [place] [address]  Magnesium     Status: None   Collection Time: 06/21/19  3:42 AM  Result Value Ref Range   Magnesium 2.1 1.7 - 2.4 mg/dL    Comment: Performed at [facility] [address] [place], [place] [address]  Glucose, capillary     Status: Abnormal   Collection Time: 06/21/19  3:49 AM  Result Value Ref Range   Glucose-Capillary 108 (H) 70 - 99 mg/dL    CT Head Wo Contrast  Result Date: 06/19/2019 CLINICAL DATA:  Altered mental status, unknown etiology. EXAM: CT HEAD WITHOUT CONTRAST TECHNIQUE: Contiguous axial images were obtained from the base of the skull through the vertex without intravenous contrast. COMPARISON:  None. FINDINGS: Brain: Generalized age related brain volume loss. Chronic small-vessel ischemic changes of the cerebral hemispheric white matter. No sign of acute large vessel territory infarction. No mass lesion, hemorrhage, hydrocephalus or extra-axial collection. 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 Skull: Normal Sinuses/Orbits: Mucosal thickening and a small amount of fluid in the left maxillary sinus, probably secondary to dental incursion. Other: None IMPRESSION: No acute intracranial finding. Atrophy. Chronic small-vessel ischemic changes. 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 Electronically Signed   By: Nelson Chimes M.D.   On: 06/19/2019 20:43   CT ANGIO CHEST PE W OR WO CONTRAST  Result Date: 06/19/2019 CLINICAL DATA:  Acute abdominal pain. History of iliac artery repair. Dyspnea. EXAM: CT ANGIOGRAPHY CHEST CT  ABDOMEN AND PELVIS WITH CONTRAST TECHNIQUE: Multidetector CT imaging of the chest was performed using the standard protocol during bolus administration of intravenous contrast. Multiplanar CT image reconstructions and MIPs were obtained to evaluate the vascular anatomy. Multidetector CT imaging of the abdomen and pelvis was performed using the standard protocol during bolus administration of intravenous contrast. CONTRAST:  146mL OMNIPAQUE IOHEXOL 350 MG/ML SOLN COMPARISON:  05/24/2019 FINDINGS: CTA CHEST FINDINGS Cardiovascular: Contrast injection is sufficient to demonstrate satisfactory opacification of the pulmonary arteries to the segmental level. There is no pulmonary embolus. The main pulmonary artery is dilated measuring approximately 4 cm in diameter. Heart size is enlarged. There is no significant pericardial effusion. Coronary artery calcifications are noted. There are atherosclerotic changes of the thoracic aorta without evidence for an aneurysm. There is a left IJ central venous catheter with tip terminating in the SVC. Mediastinum/Nodes: --No mediastinal or hilar lymphadenopathy. --No axillary lymphadenopathy. --No supraclavicular lymphadenopathy. --Normal thyroid gland. --The esophagus is unremarkable Lungs/Pleura: There are small bilateral pleural effusions. There is adjacent atelectasis. There is no pneumothorax. Musculoskeletal: No chest wall abnormality. No acute or significant osseous findings. Review of the MIP images confirms the above findings. CT ABDOMEN and PELVIS FINDINGS Hepatobiliary: Again noted are findings of cirrhosis. There is cholelithiasis with mild gallbladder wall thickening and pericholecystic free fluid.There is  no biliary ductal dilation. Pancreas: Normal contours without ductal dilatation. No peripancreatic fluid collection. Spleen: There are new wedge-shaped defects involving the spleen measuring up to approximately 3 cm. Adrenals/Urinary Tract: --Adrenal glands: No adrenal  hemorrhage. --Right kidney/ureter: The right kidney is atrophic without evidence for hydronephrosis. --Left kidney/ureter: Again identified is a hyperdense lesion arising from the posterior interpolar region of the left kidney measuring approximately 1.2 cm. There is no left-sided hydronephrosis. --Urinary bladder: The urinary bladder is significantly distended. Stomach/Bowel: --Stomach/Duodenum: No hiatal hernia or other gastric abnormality. Normal duodenal course and caliber. --Small bowel: No dilatation or inflammation. --Colon: No focal abnormality. --Appendix: Normal. 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 This is essentially stable since 07/02/2017. There is no clear stenosis at the origin of the celiac axis. The splenic artery appears to be grossly patent. 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 The graft is grossly patent. The aneurysm surrounding the left common iliac artery is stable measuring approximately 3.8 cm (axial series 5, image 49), previously measuring 3.8 cm on November 09, 2018. Embolization coils are noted in the left internal iliac artery. There is a partially visualized right AV fistula that appears grossly patent. --No retroperitoneal lymphadenopathy. --No mesenteric lymphadenopathy. --No pelvic or inguinal lymphadenopathy. Reproductive: Prostate gland is mildly enlarged. Other: There is mild diffuse body wall edema. There is a trace amount of free fluid in the patient's abdomen and pelvis. Musculoskeletal. Again noted are findings of renal osteodystrophy. There is no displaced fracture. Review of the MIP images confirms the above findings. IMPRESSION: 1. No acute pulmonary embolism. 2. Small bilateral pleural effusions with  adjacent atelectasis. 3. Dilated main pulmonary artery, which may be secondary to pulmonary arterial hypertension. 4. Interval development of wedge-shaped defects involving the spleen, which may represent splenic infarcts. 5. Cholelithiasis with mild gallbladder wall thickening and pericholecystic free fluid, which may be secondary to underlying liver disease. If there is clinical concern for acute cholecystitis, follow-up with ultrasound is recommended. 6. 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 7. 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 8. Additional chronic findings as detailed above. 9. Aortic Atherosclerosis (ICD10-I70.0). Electronically Signed   By: Constance Holster M.D.   On: 06/19/2019 21:02   CT PELVIS W CONTRAST  Result Date: 06/21/2019 CLINICAL DATA:  Vesicointestinal fistula.  Rectal contrast. EXAM: CT PELVIS WITH CONTRAST TECHNIQUE: Multidetector CT imaging of the pelvis was performed using the standard protocol following the bolus administration of intravenous contrast. CONTRAST:  134mL OMNIPAQUE IOHEXOL 300 MG/ML  SOLN COMPARISON:  Abdomen and pelvis CT from 2 days ago FINDINGS: Urinary Tract: No contrast is seen in the urinary bladder which is diffusely thick walled with reticulated appearance. Bowel: Rectal contrast was administered. There is mesorectal fat stranding which was also seen on prior. The underlying wall is not thickened and this may be from total-body volume overload. Vascular/Lymphatic: Aorto bi-iliac stenting with left hypogastric artery embolization. Where covered there is no visible aneurysmal sac enhancement. No acute vascular finding. Reproductive:  Enlarged prostate projecting into the bladder base. Other:  Prominent anasarca. Musculoskeletal: Spondylosis with bulky endplate spurring. Sacroiliac ankylosis with spurring. IMPRESSION: 1. Negative for colovesicular  fistula. Rectal contrast only opacifies the colon. 2. Bladder wall  thickening likely from chronic outlet obstruction. The prostate is enlarged. 3. Prominent anasarca. Electronically Signed   By: Monte Fantasia M.D.   On: 06/21/2019 10:46   CT ABDOMEN PELVIS W CONTRAST  Result Date: 06/19/2019 CLINICAL DATA:  Acute abdominal pain. History of iliac artery repair. Dyspnea. EXAM: CT ANGIOGRAPHY CHEST CT ABDOMEN AND PELVIS WITH CONTRAST TECHNIQUE: Multidetector CT imaging of the chest was performed using the standard protocol during bolus administration of intravenous contrast. Multiplanar CT image reconstructions and MIPs were obtained to evaluate the vascular anatomy. Multidetector CT imaging of the abdomen and pelvis was performed using the standard protocol during bolus administration of intravenous contrast. CONTRAST:  139mL OMNIPAQUE IOHEXOL 350 MG/ML SOLN COMPARISON:  05/24/2019 FINDINGS: CTA CHEST FINDINGS Cardiovascular: Contrast injection is sufficient to demonstrate satisfactory opacification of the pulmonary arteries to the segmental level. There is no pulmonary embolus. The main pulmonary artery is dilated measuring approximately 4 cm in diameter. Heart size is enlarged. There is no significant pericardial effusion. Coronary artery calcifications are noted. There are atherosclerotic changes of the thoracic aorta without evidence for an aneurysm. There is a left IJ central venous catheter with tip terminating in the SVC. Mediastinum/Nodes: --No mediastinal or hilar lymphadenopathy. --No axillary lymphadenopathy. --No supraclavicular lymphadenopathy. --Normal thyroid gland. --The esophagus is unremarkable Lungs/Pleura: There are small bilateral pleural effusions. There is adjacent atelectasis. There is no pneumothorax. Musculoskeletal: No chest wall abnormality. No acute or significant osseous findings. Review of the MIP images confirms the above findings. CT ABDOMEN and PELVIS FINDINGS Hepatobiliary:  Again noted are findings of cirrhosis. There is cholelithiasis with mild gallbladder wall thickening and pericholecystic free fluid.There is no biliary ductal dilation. Pancreas: Normal contours without ductal dilatation. No peripancreatic fluid collection. Spleen: There are new wedge-shaped defects involving the spleen measuring up to approximately 3 cm. Adrenals/Urinary Tract: --Adrenal glands: No adrenal hemorrhage. --Right kidney/ureter: The right kidney is atrophic without evidence for hydronephrosis. --Left kidney/ureter: Again identified is a hyperdense lesion arising from the posterior interpolar region of the left kidney measuring approximately 1.2 cm. There is no left-sided hydronephrosis. --Urinary bladder: The urinary bladder is significantly distended. Stomach/Bowel: --Stomach/Duodenum: No hiatal hernia or other gastric abnormality. Normal duodenal course and caliber. --Small bowel: No dilatation or inflammation. --Colon: No focal abnormality. --Appendix: Normal. 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 This is essentially stable since 07/02/2017. There is no clear stenosis at the origin of the celiac axis. The splenic artery appears to be grossly patent. 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 The graft is grossly patent. The aneurysm surrounding the left common iliac artery is stable measuring approximately 3.8 cm (axial series 5, image 49), previously measuring 3.8 cm on November 09, 2018. Embolization coils are noted in the left internal iliac artery. There is a partially visualized right AV fistula that appears grossly patent. --No retroperitoneal lymphadenopathy. --No mesenteric lymphadenopathy. --No pelvic or inguinal lymphadenopathy.  Reproductive: Prostate gland is mildly enlarged. Other: There is mild diffuse body wall edema. There is a trace amount of free fluid in the patient's abdomen and pelvis. Musculoskeletal. Again noted are findings of renal osteodystrophy. There is no displaced fracture. Review of the MIP images confirms the above findings. IMPRESSION: 1. No acute pulmonary embolism. 2. Small bilateral pleural effusions with adjacent atelectasis. 3. Dilated  main pulmonary artery, which may be secondary to pulmonary arterial hypertension. 4. Interval development of wedge-shaped defects involving the spleen, which may represent splenic infarcts. 5. Cholelithiasis with mild gallbladder wall thickening and pericholecystic free fluid, which may be secondary to underlying liver disease. If there is clinical concern for acute cholecystitis, follow-up with ultrasound is recommended. 6. 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 7. 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 8. Additional chronic findings as detailed above. 9. Aortic Atherosclerosis (ICD10-I70.0). Electronically Signed   By: Constance Holster M.D.   On: 06/19/2019 21:02   DG CHEST PORT 1 VIEW  Result Date: 06/19/2019 CLINICAL DATA:  Status post central line placement EXAM: PORTABLE CHEST 1 VIEW COMPARISON:  Film from earlier in the same day. FINDINGS: Cardiac shadow is enlarged but stable. New left jugular central line is noted at the cavoatrial junction. Vascular congestion is noted with increasing pulmonary edema. No focal confluent infiltrate is seen. No bony abnormality is noted. No pneumothorax is noted. IMPRESSION: Vascular congestion with increasing edema. No pneumothorax following central line placement. Electronically Signed   By: Inez Catalina M.D.   On: 06/19/2019 17:20   US Abdomen Limited RUQ  Result Date: 06/20/2019 CLINICAL DATA:  69 year old male with  abdominal pain, abnormal CT appearance of the gallbladder yesterday. EXAM: ULTRASOUND ABDOMEN LIMITED RIGHT UPPER QUADRANT COMPARISON:  CTA chest, abdomen and pelvis 06/19/2019. FINDINGS: Gallbladder: The gallbladder appears similarly distended, with sludge in the lumen (image 5). No shadowing echogenic stones identified. There is non concentric appearing gallbladder wall thickening up to 6 mm (image 7). No pericholecystic fluid is evident. No sonographic Percell Miller sign was elicited. Common bile duct: Diameter: 7-8 mm, upper limits of normal to mildly dilated. The CBD is visible to the pancreatic head (image 19) with no obvious filling defect. Liver: No intrahepatic ductal dilatation. Liver echogenicity within normal limits. No discrete liver lesion. Portal vein is patent on color Doppler imaging with normal direction of blood flow towards the liver. Other: Atrophic right kidney better demonstrated by CT. IMPRESSION: 1. Both the gallbladder and the CBD appear mildly distended with sludge but no stone identified. No intrahepatic biliary ductal dilatation to strongly suggest acute bile duct obstruction. Some gallbladder wall thickening, although no sonographic Murphy sign elicited to strongly suggest acalculous cholecystitis. 2. If abdominal pain continues then a nuclear medicine hepatobiliary scan would be valuable to exclude acute CBD or cystic duct obstruction. Electronically Signed   By: Genevie Ann M.D.   On: 06/20/2019 23:50    Review of Systems  Unable to perform ROS: Dementia   Blood pressure (!) 132/50, pulse 80, temperature (!) 94.1 F (34.5 C), temperature source Axillary, resp. rate 10, height 6\' 4"  (1.93 m), weight 81.2 kg, SpO2 100 %. Physical Exam  Constitutional:  Very ill and frail apperaing. Family at bedside who provides all history.   HENT:  Head: Normocephalic.  Eyes: Pupils are equal, round, and reactive to light.  Cardiovascular:  HR 80s by bedside monitor.   Respiratory: Effort normal.   GI: Soft.  Genitourinary:    Penis normal.   Musculoskeletal:        General: Normal range of motion.     Cervical back: Normal range of motion.  Neurological:  AO x -0.   Psychiatric:  No affect. Non-verbal.     Assessment/Plan:  1 - End Stage Renal Disease - per nephrology. No indication for any sort  of further renal drainage.   2 - Complex Urinary Infection / Pyocystis / Sepsis - only modifiable factor on my eval is likely mild bladder outlet obstruction + pyocystis, now drained. Rec DC on tamsulosin. Can DC foley when SIRS picture resolved.   3 -  Rule Out Colovesical Fistula  -  Very low suspcision by history and imaging. This was severe pyocystits mimmicking fecal material. .    WE will arrange GU followup in outpatient setting. Please call me directly with questions.    Alexis Frock 06/21/2019, 11:36 AM

## 2019-06-21 NOTE — Progress Notes (Signed)
CRRT filter clotted which presented an opportunity to notify CT if they are able to accommodate patient before restarting him back on therapy, but CT was not able to accommodate patient into their schedule at this time.

## 2019-06-21 NOTE — Progress Notes (Signed)
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  Sepsis suspected, patient had in-out urinary cath which produced stool. On norepi, phenylephrine, and vasopressin.  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  His wife reports that he is normally alert and oriented, but seemed to be "spaced out" and was having a hard time answering questions.  He had no URI symptoms, fever, diarrhea, known ill contacts or obvious focal neuro deficits.  He missed dialysis on 2/13 and when he was not improving his wife called EMS.  Work-up in the ED revealed leukocytosis of 25k, thrombocytopenia, lactic acid of 3.2, bilirubin 2.4 with normal LFTs.  He received 3.5 L IV fluids and empiric cefepime, vancomycin, Flagyl after which his blood pressure remained borderline and mental status only slightly improved.   Patient will answer questions and mentions left-sided back pain, denies abdominal pain. CT head was ordered and PCCM consulted for admission.  Covid-19 negative.  Past Medical History   has a past medical history of Anemia in chronic kidney disease, Arthritis, CHF (congestive heart failure) ([place]), Chronic back pain, End stage renal disease ([place]), GERD (gastroesophageal reflux disease), Gout, Heart failure  with reduced ejection fraction ([place]), and Hypertension. celiac and superior mesenteric artery aneurysms   Significant Hospital Events   2/14 Admit to [facility] 2/14 He developed SVT, was hemodynamically unstable, treated initially with adenosine but ultimately required DCCV around 715. Has remained hypotensive, received albumin and now another 2 L IV fluid resuscitation (5 L total). Interacting but confused, still not back to baseline per wife at bedside 2/15 Stool produced from in-out urinary cath   Consults:  nephrology  Procedures:  2/14 left IJ HD cath insertion  Significant Diagnostic Tests:  2/14 CXR>>Cardiomegaly.  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  Chronic elevation of the left hemidiaphragm with chronic volume loss at the left lung base.  2/14 CT Abd IMPRESSION: 1. No acute pulmonary embolism. 2. Small bilateral pleural effusions with adjacent atelectasis. 3. Dilated main pulmonary artery, which may be secondary to pulmonary arterial hypertension. 4. Interval development of wedge-shaped defects involving the spleen, which may represent splenic infarcts. 5. Cholelithiasis with mild gallbladder wall thickening and pericholecystic free fluid, which may be secondary to underlying liver disease. If there is clinical concern for acute cholecystitis, follow-up with ultrasound is recommended. 6. 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 7. 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 8. Additional chronic findings as detailed above. 9. Aortic Atherosclerosis (ICD10-I70.0).  CT Head 2/14 IMPRESSION: No acute intracranial finding. Atrophy. Chronic small-vessel ischemic changes. 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  Echo 2/14 . Left  ventricular ejection fraction, by  estimation, is 60 to 65%. The  left ventricle has normal function. The left ventricle has no regional  wall motion abnormalities. Left ventricular diastolic parameters are  indeterminate.   2. Right ventricular systolic function is normal. The right ventricular  size is normal.   3. Left atrial size was severely dilated.   4. Right atrial size was mildly dilated.   5. The mitral valve is degenerative. Mild mitral valve regurgitation. No  evidence of mitral stenosis.   6. The aortic valve is tricuspid. Aortic valve regurgitation is mild to  moderate. Mild aortic valve stenosis.   7. The inferior vena cava is normal in size with greater than 50%  respiratory variability, suggesting right atrial pressure of 3 mmHg.   RUQ U/S 2/15 IMPRESSION: 1. Both the gallbladder and the CBD appear mildly distended with sludge but no stone identified. No intrahepatic biliary ductal dilatation to strongly suggest acute bile duct obstruction. Some gallbladder wall thickening, although no sonographic Murphy sign elicited to strongly suggest acalculous cholecystitis.   2. If abdominal pain continues then a nuclear medicine hepatobiliary scan would be valuable to exclude acute CBD or cystic duct obstruction.  CT Abd/pelvis w/ rectal contrast -pending  Micro Data:  2/14 respiratory viral panel>> negative 2/14 blood cultures x2>> [place] bacter  CEFAZOLIN <=4 SENSITIVE  Sensitive     CEFEPIME <=0.12 SENS... Sensitive    CEFTAZIDIME <=1 SENSITIVE  Sensitive    CEFTRIAXONE <=0.25 SENS... Sensitive    CIPROFLOXACIN <=0.25 SENS... Sensitive    GENTAMICIN <=1 SENSITIVE  Sensitive    IMIPENEM <=0.25 SENS... Sensitive    PIP/TAZO <=4 SENSITIVE  Sensitive    TRIMETH/SULFA <=20 SENSIT... Sensitive     2/15 UCx G(-) rods  Antimicrobials:  Flagyl 2/14 only Cefepime 2/14-d/c 2/15 Vancomycin 2/14-d/c2/15 Meropenem 2/15 ->  Interim history/subjective:  Patient with low  temps to 94 o/n has bear hugger. On levo at 4 and neo at 75. Bcx returned with citrobacter. RUQ not conclusively cholecystitis. Pt to have CT today.  Objective   Blood pressure 123/63, pulse (!) 107, temperature (!) 94.1 F (34.5 C), temperature source Axillary, resp. rate 18, height 6\' 4"  (1.93 m), weight 81.2 kg, SpO2 100 %.        Intake/Output Summary (Last 24 hours) at 06/21/2019 0833 Last data filed at 06/21/2019 0800 Gross per 24 hour  Intake 1481.19 ml  Output 2992 ml  Net -1510.81 ml   Filed Weights   06/19/19 0523 06/20/19 0345 06/21/19 0439  Weight: 72.8 kg 72.9 kg 81.2 kg   General: Thin elderly man, laying in bed in no distress.  HEENT: Mucus membranes moist, HD cath in place left IJ Neuro: He is awake, alert, interacts but is slow to respond to questions and occasionally inappropriate CV: Tachycardic, sinus rhthymn PULM: Clear bilaterally, no wheeze, no crackles GI: Nondistended, nontender with palpation, positive bowel sounds Extremities: Large right upper extremity fistula with good thrill, no edema, feet cool bilaterally but DP pulses intact Skin: Apparent chronic changes bilateral LE   Resolved Hospital Problem list     Assessment & Plan:   Septic shock, gram (-) sepsis Cystitis -Lactic acid 3.2 >> 2.5 with resuscitation. BCx  grew citrobacter 2/15, urine culture also citrobacter +. RUQ U/S sows only mild GB wall thickening, cholecystitis less likely. CT abd/pelv today negative for fistula. Last pressors levofed 4, neo 75. P: -Switch mero to cetriaxone for now -Continue pressors, wean as indicated -In and out cath today -Plan to consult urology -Prednisone  5 mg daily on his home medicine list, question whether he is taking this.  Start stress dose steroids 2/14 -CBC this PM -Start midodrine 2/14  Encephalopathy Baseline alert and oriented, generalized lethargy and confusion without focal neuro deficits P: Suspect secondary to underlying process,  infection, sepsis, hypotension.  He missed HD on 2/13 so consider contribution of his uremia. Heat CT showed no acute intracranial abnormality. Ammonia 35. -Continue CRRT  SVT, presumed secondary to underlying process -Required cardioversion -Off amiodarone -Monitor rate and rhythm  End-stage renal disease -Anuric, Goes to [facility] dialysis M/W/F -Missed dialysis on 2/13 P: Appreciate nephrology assistance Continue CRRT  History of left renal lesion, question renal cell CA P: CT abdomen shows hyperdense 1.2 lesion   Hypertension, HFpEF -Not on any home antihypertensives -Echo showed EF 60-65% w/ indeterminate diastolic function P: Antihypertensive regimen on hold  Thrombocytopenia Probably be secondary to gram-negative sepsis, on chart review CT abdomen pelvis done in January 2021 notes enhancing lesion of the left kidney concerning for small renal cell carcinoma.  Considered DIC, consider TTP however no schistocytes found on smear. Baseline plt >100. No signs of active bleeding. DIC panel negative for schistocytes P: CBC this PM Follow for any evidence of bleeding Would transfuse platelets if less than 10K  Malnutrition -Wife notes very poor appetite for the last several days -Albumin 2.1 -SLP noted dysphagia 1 P: Can initiate dysphagia 1 diet  Celiac and superior mesenteric artery aneurysms -On CT scanning 05/2019 aneurysmaldilatation of bilateral common iliac arteries again noted. Left measures 3.5 cm in diameter, R 3.3 cm. -Follows outpatient with vascular surgery  Best practice:  Diet: N.p.o. Pain/Anxiety/Delirium protocol (if indicated): N/A VAP protocol (if indicated): N/A DVT prophylaxis: Heparin, SCDs GI prophylaxis: N/A Glucose control: N/A Mobility: Bedrest Code Status: Full code, confirmed with wife Family Communication: Wife Mickel Baas updated at bedside Disposition: ICU  Labs   CBC: Recent Labs  Lab 06/19/19 0000 06/19/19 0556 06/20/19 0312   WBC 25.7* 19.2* 26.6*  NEUTROABS 23.4*  --   --   HGB 13.1 13.0 11.1*  HCT 38.3* 37.2* 32.4*  MCV 87.2 85.7 85.0  PLT 17* 13*  13* 15*    Basic Metabolic Panel: Recent Labs  Lab 06/19/19 0556 06/19/19 1822 06/20/19 0312 06/20/19 1600 06/21/19 0342  NA 135 139 137 139 137  K 4.7 4.5 5.2* 5.2* 4.9  CL 97* 102 103 103 105  CO2 20* 20* 17* 18* 18*  GLUCOSE 100* 104* 131* 143* 126*  BUN 86* 82* 85* 71* 56*  CREATININE 9.14* 8.11* 8.70* 6.42* 5.24*  CALCIUM 8.1* 7.8* 8.0* 7.8* 7.8*  MG 1.8  --  1.8  --  2.1  PHOS 2.3*  --  4.4 3.8 3.8   GFR: Estimated Creatinine Clearance: 15.1 mL/min (A) (by C-G formula based on SCr of 5.24 mg/dL (H)). Recent Labs  Lab 06/19/19 0000 06/19/19 0556 06/20/19 0312  WBC 25.7* 19.2* 26.6*  LATICACIDVEN 3.2* 2.5* 2.8*    Liver Function Tests: Recent Labs  Lab 06/19/19 0000 06/20/19 0312 06/20/19 1600 06/21/19 0342  AST 32 26  --   --   ALT 9 14  --   --   ALKPHOS 136* 126  --   --   BILITOT 2.4* 1.7*  --   --   PROT 5.8* 5.1*  --   --   ALBUMIN 2.1* 1.9* 2.1* 2.0*   No results for input(s): LIPASE, AMYLASE in the last 168 hours. Recent Labs  Lab 06/19/19 [phone]  AMMONIA 35    ABG    Component Value Date/Time   PHART 7.404 06/19/2019 1738   PCO2ART 34.6 06/19/2019 1738   PO2ART 63.3 (L) 06/19/2019 1738   HCO3 21.2 06/19/2019 1738   TCO2 21 11/08/2014 0735   ACIDBASEDEF 2.8 (H) 06/19/2019 1738   O2SAT 90.7 06/19/2019 1738     Coagulation Profile: Recent Labs  Lab 06/19/19 0000 06/19/19 0556 06/20/19 0312  INR 1.2 1.2 1.5*    Cardiac Enzymes: No results for input(s): CKTOTAL, CKMB, CKMBINDEX, TROPONINI in the last 168 hours.  HbA1C: No results found for: HGBA1C  CBG: Recent Labs  Lab 06/20/19 0801 06/20/19 1115 06/20/19 2056 06/21/19 0017 06/21/19 0349  GLUCAP 126* 130* 137* 101* 108*

## 2019-06-22 LAB — RENAL FUNCTION PANEL
Albumin: 1.9 g/dL — ABNORMAL LOW (ref 3.5–5.0)
Albumin: 1.9 g/dL — ABNORMAL LOW (ref 3.5–5.0)
Anion gap: 13 (ref 5–15)
Anion gap: 11 (ref 5–15)
BUN: 36 mg/dL — ABNORMAL HIGH (ref 8–23)
BUN: 27 mg/dL — ABNORMAL HIGH (ref 8–23)
CO2: 21 mmol/L — ABNORMAL LOW (ref 22–32)
CO2: 21 mmol/L — ABNORMAL LOW (ref 22–32)
Calcium: 7.3 mg/dL — ABNORMAL LOW (ref 8.9–10.3)
Calcium: 7.4 mg/dL — ABNORMAL LOW (ref 8.9–10.3)
Chloride: 103 mmol/L (ref 98–111)
Chloride: 106 mmol/L (ref 98–111)
Creatinine, Ser: 3.15 mg/dL — ABNORMAL HIGH (ref 0.61–1.24)
Creatinine, Ser: 2.49 mg/dL — ABNORMAL HIGH (ref 0.61–1.24)
GFR calc Af Amer: 22 mL/min — ABNORMAL LOW (ref >60)
GFR calc Af Amer: 29 mL/min — ABNORMAL LOW (ref >60)
GFR calc non Af Amer: 19 mL/min — ABNORMAL LOW (ref >60)
GFR calc non Af Amer: 25 mL/min — ABNORMAL LOW (ref >60)
Glucose, Bld: 121 mg/dL — ABNORMAL HIGH (ref 70–99)
Glucose, Bld: 123 mg/dL — ABNORMAL HIGH (ref 70–99)
Phosphorus: 2.6 mg/dL (ref 2.5–4.6)
Phosphorus: 2.1 mg/dL — ABNORMAL LOW (ref 2.5–4.6)
Potassium: 4.6 mmol/L (ref 3.5–5.1)
Potassium: 4.5 mmol/L (ref 3.5–5.1)
Sodium: 137 mmol/L (ref 135–145)
Sodium: 138 mmol/L (ref 135–145)

## 2019-06-22 LAB — CBC WITH DIFFERENTIAL/PLATELET
Abs Immature Granulocytes: 0.44 10*3/uL — ABNORMAL HIGH (ref 0.00–0.07)
Basophils Absolute: 0 10*3/uL (ref 0.0–0.1)
Basophils Relative: 0 %
Eosinophils Absolute: 0 10*3/uL (ref 0.0–0.5)
Eosinophils Relative: 0 %
HCT: 31.1 % — ABNORMAL LOW (ref 39.0–52.0)
Hemoglobin: 10.7 g/dL — ABNORMAL LOW (ref 13.0–17.0)
Immature Granulocytes: 3 %
Lymphocytes Relative: 2 %
Lymphs Abs: 0.3 10*3/uL — ABNORMAL LOW (ref 0.7–4.0)
MCH: 29.3 pg (ref 26.0–34.0)
MCHC: 34.4 g/dL (ref 30.0–36.0)
MCV: 85.2 fL (ref 80.0–100.0)
Monocytes Absolute: 0.9 10*3/uL (ref 0.1–1.0)
Monocytes Relative: 5 %
Neutro Abs: 14.9 10*3/uL — ABNORMAL HIGH (ref 1.7–7.7)
Neutrophils Relative %: 90 %
Platelets: 15 10*3/uL — CL (ref 150–400)
RBC: 3.65 MIL/uL — ABNORMAL LOW (ref 4.22–5.81)
RDW: 15.9 % — ABNORMAL HIGH (ref 11.5–15.5)
WBC: 16.5 10*3/uL — ABNORMAL HIGH (ref 4.0–10.5)
nRBC: 0 % (ref 0.0–0.2)

## 2019-06-22 LAB — URINE CULTURE: Culture: >=100000 — AB

## 2019-06-22 LAB — GLUCOSE, CAPILLARY
Glucose-Capillary: 116 mg/dL — ABNORMAL HIGH (ref 70–99)
Glucose-Capillary: 111 mg/dL — ABNORMAL HIGH (ref 70–99)
Glucose-Capillary: 120 mg/dL — ABNORMAL HIGH (ref 70–99)
Glucose-Capillary: 98 mg/dL (ref 70–99)

## 2019-06-22 LAB — MAGNESIUM: Magnesium: 2.1 mg/dL (ref 1.7–2.4)

## 2019-06-22 MED ORDER — AMIODARONE IV BOLUS ONLY 150 MG/100ML
150.0000 mg | Freq: Once | INTRAVENOUS | Status: AC
  Administered 2019-06-22: 150 mg via INTRAVENOUS
  Filled 2019-06-22: qty 100

## 2019-06-22 MED ORDER — DEXTROSE 50 % IV SOLN
INTRAVENOUS | Status: AC
  Filled 2019-06-22: qty 50

## 2019-06-22 MED ORDER — CHLORHEXIDINE GLUCONATE CLOTH 2 % EX PADS
6.0000 | MEDICATED_PAD | Freq: Every day | CUTANEOUS | Status: DC
  Administered 2019-06-23 – 2019-06-29 (×7): 6 via TOPICAL

## 2019-06-22 MED ORDER — NEPRO/CARBSTEADY PO LIQD
237.0000 mL | Freq: Three times a day (TID) | ORAL | Status: DC
  Administered 2019-06-22 – 2019-06-30 (×13): 237 mL via ORAL

## 2019-06-22 MED ORDER — ENSURE ENLIVE PO LIQD
237.0000 mL | Freq: Three times a day (TID) | ORAL | Status: DC
  Administered 2019-06-22: 12:00:00 237 mL via ORAL

## 2019-06-22 NOTE — Progress Notes (Signed)
Patient seen and examined with medical student.  Agree with their assessment and plan as written.  S: Seen in f/u for septic shock from fulminant cystitis.  No events overnight.  More alert but not back to baseline.  O: Blood pressure 124/64, pulse (!) 110, temperature 98.6 F (37 C), resp. rate 17, height 6\' 4"  (1.93 m), weight 79.1 kg, SpO2 100 %.  GEN: chronically ill man lying in bed HEENT: MM dry, trachea midline CV: tachycardic, HSM over precordium PULM: CTAB, no accessory muscle use GI: Soft, +BS EXT: Muscle wasting, trivial edema NEURO: Moves all 4 ext to command but slowly PSYCH: AO to self SKIN: No rashes, LIJ HD cath CDI  A: Citrobacter septic shock- from contaminated bladder presumably ascending to pyelo?  CT without any evidence of fistula. ESRD- now on CRRT, does not look grossly up Metabolic encephalopathy- stable to slightly improved Muscular deconditioning- present on admission Thrombocytopenia- low grade DIC plus septic response, monitor for now   P:  - Maybe switch to fluid even on CRRT? Will d/w nephrology - Continue midodrine, wean pressors as able - Continue ceftriaxone x10-14 days - Dysphagia 1 diet - Foley per urology, question if needs daily irrigation? - Encourage day/night cycle, re-orient as able - Trend plts  The patient is critically ill with multiple organ systems failure and requires high complexity decision making for assessment and support, frequent evaluation and titration of therapies, application of advanced monitoring technologies and extensive interpretation of multiple databases. Critical Care Time devoted to patient care services described in this note independent of APP/resident  time is 35 minutes.   06/22/2019 Erskine Emery MD

## 2019-06-22 NOTE — Progress Notes (Signed)
CRITICAL VALUE ALERT  Critical Value: Platelets 15  Date & Time Notied:  06/22/19 04:25  Orders Received/Actions taken: Transfuse platelets less than 10.

## 2019-06-22 NOTE — Progress Notes (Signed)
Initial Nutrition Assessment  DOCUMENTATION CODES:   Severe malnutrition in context of chronic illness  INTERVENTION:    Nepro Shake po TID, each supplement provides 425 kcal and 19 grams protein  NUTRITION DIAGNOSIS:   Severe Malnutrition related to chronic illness(ESRD on HD) as evidenced by severe muscle depletion, severe fat depletion.  GOAL:   Patient will meet greater than or equal to 90% of their needs  MONITOR:   PO intake, Supplement acceptance, Labs  REASON FOR ASSESSMENT:   Consult Poor PO  ASSESSMENT:   69 yo male admitted with septic shock from fulminant cystitis. PMH includes HTN, CHF, HF, GERD, ESRD on HD, anemia.  Notified by SLP that patient is eating very poorly.  He is currently on a dysphagia 1 diet with thin liquids, 5-25% meal completion. Patient opened eyes when name called, but did not answer any of RD's questions.   Currently requiring CRRT.  Labs reviewed. BUN 36 (H), creatinine 3.15 (H) Potassium, phosphorus, and magnesium are WNL. CBG's: [phone]  Medications reviewed and include Sensipar, Solu-cortef, Novolog, Flomax, levophed, phenylephrine, vasopressin.  Patient is anuric.   NUTRITION - FOCUSED PHYSICAL EXAM:    Most Recent Value  Orbital Region  Severe depletion  Upper Arm Region  Unable to assess  Thoracic and Lumbar Region  Moderate depletion  Buccal Region  Severe depletion  Temple Region  Severe depletion  Clavicle Bone Region  Severe depletion  Clavicle and Acromion Bone Region  Moderate depletion  Scapular Bone Region  Severe depletion  Dorsal Hand  Mild depletion  Patellar Region  Moderate depletion  Anterior Thigh Region  Mild depletion  Posterior Calf Region  Unable to assess  Edema (RD Assessment)  Mild  Hair  Reviewed  Eyes  Reviewed  Mouth  Unable to assess  Skin  Reviewed  Nails  Reviewed       Diet Order:   Diet Order            DIET - DYS 1 Room service appropriate? Yes; Fluid consistency: Thin   Diet effective now              EDUCATION NEEDS:   Not appropriate for education at this time  Skin:  Skin Assessment: Reviewed RN Assessment  Last BM:  2/14  Height:   Ht Readings from Last 1 Encounters:  06/19/19 6\' 4"  (1.93 m)    Weight:   Wt Readings from Last 1 Encounters:  06/22/19 79.1 kg    Ideal Body Weight:  91.8 kg  BMI:  Body mass index is 21.23 kg/m.  Estimated Nutritional Needs:   Kcal:  2400-2600  Protein:  140-160 gm  Fluid:  1 L    Molli Barrows, RD, LDN, CNSC Please refer to Amion for contact information.

## 2019-06-22 NOTE — Progress Notes (Addendum)
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  Sepsis suspected, patient had in-out urinary cath which produced stool. On norepi, phenylephrine, and vasopressin.  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  His wife reports that he is normally alert and oriented, but seemed to be "spaced out" and was having a hard time answering questions.  He had no URI symptoms, fever, diarrhea, known ill contacts or obvious focal neuro deficits.  He missed dialysis on 2/13 and when he was not improving his wife called EMS.  Work-up in the ED revealed leukocytosis of 25k, thrombocytopenia, lactic acid of 3.2, bilirubin 2.4 with normal LFTs.  He received 3.5 L IV fluids and empiric cefepime, vancomycin, Flagyl after which his blood pressure remained borderline and mental status only slightly improved.   Patient will answer questions and mentions left-sided back pain, denies abdominal pain. CT head was ordered and PCCM consulted for admission.  Covid-19 negative.  Past Medical History   has a past medical history of Anemia in chronic kidney disease, Arthritis, CHF (congestive heart failure) ([place]), Chronic back pain, End stage renal disease ([place]), GERD (gastroesophageal reflux disease), Gout, Heart failure  with reduced ejection fraction ([place]), and Hypertension. celiac and superior mesenteric artery aneurysms   Significant Hospital Events   2/14 Admit to [facility] 2/14 He developed SVT, was hemodynamically unstable, treated initially with adenosine but ultimately required DCCV around 715. Has remained hypotensive, received albumin and now another 2 L IV fluid resuscitation (5 L total). Interacting but confused, still not back to baseline per wife at bedside 2/15 Stool-like produced from in-out urinary cath 2/16 Urology consult, confirmed pyocystitis and drained bladder   Consults:  nephrology  Procedures:  2/14 left IJ HD cath insertion 2/16 Bedside bladder aspiration  Significant Diagnostic Tests:  2/14 CXR>>Cardiomegaly.  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  Chronic elevation of the left hemidiaphragm with chronic volume loss at the left lung base.  2/14 CT Abd 1. No acute pulmonary embolism. 2. Small bilateral pleural effusions with adjacent atelectasis. 3. Dilated main pulmonary artery, which may be secondary to pulmonary arterial hypertension. 4. Interval development of wedge-shaped defects involving the spleen, which may represent splenic infarcts. 5. Cholelithiasis with mild gallbladder wall thickening and pericholecystic free fluid, which may be secondary to underlying liver disease. If there is clinical concern for acute cholecystitis, follow-up with ultrasound is recommended. 6. 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 7. 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 8. Additional chronic findings as detailed above. 9. Aortic Atherosclerosis (ICD10-I70.0).  2/16 CT Pelvis with Rectal contrast 1. Negative for colovesicular fistula. Rectal contrast only opacifies the colon. 2. Bladder wall thickening likely from chronic outlet obstruction. The prostate is  enlarged. 3. Prominent anasarca.   CT Head  2/14 No acute intracranial finding. Atrophy. Chronic small-vessel ischemic changes. 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  Echo 2/14 . Left ventricular ejection fraction, by estimation, is 60 to 65%. The  left ventricle has normal function. The left ventricle has no regional  wall motion abnormalities. Left ventricular diastolic parameters are  indeterminate.   2. Right ventricular systolic function is normal. The right ventricular  size is normal.   3. Left atrial size was severely dilated.   4. Right atrial size was mildly dilated.   5. The mitral valve is degenerative. Mild mitral valve regurgitation. No  evidence of mitral stenosis.   6. The aortic valve is tricuspid. Aortic valve regurgitation is mild to  moderate. Mild aortic valve stenosis.   7. The inferior vena cava is normal in size with greater than 50%  respiratory variability, suggesting right atrial pressure of 3 mmHg.   RUQ U/S 2/15 1. Both the gallbladder and the CBD appear mildly distended with sludge but no stone identified. No intrahepatic biliary ductal dilatation to strongly suggest acute bile duct obstruction. Some gallbladder wall thickening, although no sonographic Murphy sign elicited to strongly suggest acalculous cholecystitis. 2. If abdominal pain continues then a nuclear medicine hepatobiliary scan would be valuable to exclude acute CBD or cystic duct obstruction.    Micro Data:  2/14 respiratory viral panel>> negative 2/14 blood cultures x2>> Citrobacter, sensitivities below 2/15 UCx Citrobacter  CEFAZOLIN <=4 SENSITIVE  Sensitive     CEFEPIME <=0.12 SENS... Sensitive    CEFTAZIDIME <=1 SENSITIVE  Sensitive    CEFTRIAXONE <=0.25 SENS... Sensitive    CIPROFLOXACIN <=0.25 SENS... Sensitive    GENTAMICIN <=1 SENSITIVE  Sensitive     IMIPENEM <=0.25 SENS... Sensitive    PIP/TAZO <=4 SENSITIVE  Sensitive    TRIMETH/SULFA <=20 SENSIT... Sensitive     2/15 UCx G(-) rods  Antimicrobials:  Flagyl 2/14 only Cefepime 2/14-d/c 2/15 Vancomycin 2/14-d/c2/15 Meropenem 2/15 -> d/c 2/16 Ceftx 2/16 -->  Interim history/subjective:  Seen by urology, bedside bladder aspiration confirms pyocystitis. Negative CT for colovesical fistula. Afebrile overnight. Pressors continue coming down, Levofed @ 4, Neo @ 13.5, vasopressin @ 11.5. Leukocytosis improving.  Objective   Blood pressure 119/62, pulse (!) 105, temperature 97.9 F (36.6 C), resp. rate (!) 9, height 6\' 4"  (1.93 m), weight 79.1 kg, SpO2 100 %.        Intake/Output Summary (Last 24 hours) at 06/22/2019 0717 Last data filed at 06/22/2019 0700 Gross per 24 hour  Intake 906.39 ml  Output 2307 ml  Net -1400.61 ml   Filed Weights   06/20/19 0345 06/21/19 0439 06/22/19 0500  Weight: 72.9 kg 81.2 kg 79.1 kg   General: Thin elderly man, laying in bed in no distress.  HEENT: Mucus membranes moist, HD cath in place left IJ Neuro: He is awake, alert, interacts but is slow to respond to questions and occasionally inappropriate CV: Tachycardic, sinus rhthymn PULM: Clear bilaterally, no wheeze, no crackles GI: Nondistended, nontender with palpation, positive bowel sounds Extremities: Large right upper extremity fistula with good thrill, 1+ pitting edema bilateral LE Skin: Apparent chronic changes bilateral LE   Resolved Hospital Problem list     Assessment & Plan:   Septic shock, gram (-) sepsis Pyocystitis -Lactic acid 3.2 >> 2.5 with resuscitation. BCx  grew citrobacter 2/15, urine culture also citrobacter +. RUQ U/S sows only mild GB wall  thickening, cholecystitis less likely. CT abd/pelv today negative for fistula. Bedside aspiration by urology confirms pyocystitis. P: -Continue ceftriaxone for 10-14 days -Continue pressors, wean as indicated -Prednisone 5 mg  daily on his home medicine list, question whether he is taking this.  Start stress dose steroids 2/14 -Daily CBC -Start midodrine 2/14 -Daily bladder irrigation?  Encephalopathy Baseline alert and oriented, generalized lethargy and confusion without focal neuro deficits P: Suspect secondary to underlying process, infection, sepsis, hypotension.  He missed HD on 2/13 so consider contribution of his uremia. Heat CT showed no acute intracranial abnormality. Ammonia 35. -Continue CRRT  Thrombocytopenia Probably be secondary to gram-negative sepsis, component of DIC as well. No schistocytes found on smear. Baseline plt >100. No signs of active bleeding. INR slight elevated, Fibrinogen high end of nl limit. P: Follow CBC Follow for any evidence of bleeding Would transfuse platelets if less than 10K  Resolved, SVT, presumed secondary to underlying process -Required cardioversion -Off amiodarone -Monitor rate and rhythm  End-stage renal disease -Anuric, Goes to [facility] dialysis M/W/F -Missed dialysis on 2/13 P: Appreciate nephrology assistance Continue CRRT  History of left renal lesion, question renal cell CA P: CT abdomen shows hyperdense 1.2 lesion  Outpt f/u with urology  Hypertension, HFpEF -Not on any home antihypertensives -Echo showed EF 60-65% w/ indeterminate diastolic function P: Antihypertensive regimen on hold  Hypocalcemia Per nephrology on cincalcet. Correct calcium 8.1mg /dl.  -Follow and consider correct if symptoms arise  Malnutrition -Wife notes very poor appetite for the last several days -Albumin 2.1 -SLP noted dysphagia 1 P: Can initiate dysphagia 1 diet  Celiac and superior mesenteric artery aneurysms -On CT scanning 05/2019 aneurysmaldilatation of bilateral common iliac arteries again noted. Left measures 3.5 cm in diameter, R 3.3 cm. -Follows outpatient with vascular surgery  Best practice:  Diet: Dysphagia 1 diet Pain/Anxiety/Delirium  protocol (if indicated): N/A VAP protocol (if indicated): N/A DVT prophylaxis: Heparin, SCDs GI prophylaxis: N/A Glucose control: N/A Mobility: Bedrest Code Status: Full code, confirmed with wife Family Communication: Wife Mickel Baas updated at bedside Disposition: ICU  Labs   CBC: Recent Labs  Lab 06/19/19 0000 06/19/19 0556 06/20/19 0312 06/22/19 0319  WBC 25.7* 19.2* 26.6* 16.5*  NEUTROABS 23.4*  --   --  14.9*  HGB 13.1 13.0 11.1* 10.7*  HCT 38.3* 37.2* 32.4* 31.1*  MCV 87.2 85.7 85.0 85.2  PLT 17* 13*  13* 15* 15*    Basic Metabolic Panel: Recent Labs  Lab 06/19/19 0556 06/19/19 1822 06/20/19 0312 06/20/19 1600 06/21/19 0342 06/21/19 1520 06/22/19 0319  NA 135   < > 137 139 137 135 137  K 4.7   < > 5.2* 5.2* 4.9 4.6 4.6  CL 97*   < > 103 103 105 102 103  CO2 20*   < > 17* 18* 18* 20* 21*  GLUCOSE 100*   < > 131* 143* 126* 148* 121*  BUN 86*   < > 85* 71* 56* 44* 36*  CREATININE 9.14*   < > 8.70* 6.42* 5.24* 3.81* 3.15*  CALCIUM 8.1*   < > 8.0* 7.8* 7.8* 7.9* 7.3*  MG 1.8  --  1.8  --  2.1  --  2.1  PHOS 2.3*  --  4.4 3.8 3.8 3.1 2.6   < > = values in this interval not displayed.   GFR: Estimated Creatinine Clearance: 24.4 mL/min (A) (by C-G formula based on SCr of 3.15 mg/dL (H)). Recent Labs  Lab 06/19/19 0000 06/19/19 0556 06/20/19 0312 06/22/19  0319  WBC 25.7* 19.2* 26.6* 16.5*  LATICACIDVEN 3.2* 2.5* 2.8*  --     Liver Function Tests: Recent Labs  Lab 06/19/19 0000 06/19/19 0000 06/20/19 0312 06/20/19 1600 06/21/19 0342 06/21/19 1520 06/22/19 0319  AST 32  --  26  --   --   --   --   ALT 9  --  14  --   --   --   --   ALKPHOS 136*  --  126  --   --   --   --   BILITOT 2.4*  --  1.7*  --   --   --   --   PROT 5.8*  --  5.1*  --   --   --   --   ALBUMIN 2.1*   < > 1.9* 2.1* 2.0* 1.8* 1.9*   < > = values in this interval not displayed.   No results for input(s): LIPASE, AMYLASE in the last 168 hours. Recent Labs  Lab 06/19/19 0556   AMMONIA 35    ABG    Component Value Date/Time   PHART 7.404 06/19/2019 1738   PCO2ART 34.6 06/19/2019 1738   PO2ART 63.3 (L) 06/19/2019 1738   HCO3 21.2 06/19/2019 1738   TCO2 21 11/08/2014 0735   ACIDBASEDEF 2.8 (H) 06/19/2019 1738   O2SAT 90.7 06/19/2019 1738     Coagulation Profile: Recent Labs  Lab 06/19/19 0000 06/19/19 0556 06/20/19 0312  INR 1.2 1.2 1.5*    Cardiac Enzymes: No results for input(s): CKTOTAL, CKMB, CKMBINDEX, TROPONINI in the last 168 hours.  HbA1C: No results found for: HGBA1C  CBG: Recent Labs  Lab 06/20/19 2056 06/21/19 0017 06/21/19 0349 06/21/19 2354 06/22/19 0333  GLUCAP 137* 101* 108* 103* 116*

## 2019-06-22 NOTE — Progress Notes (Signed)
[facility] Progress Note Patient Name: Gregory [name] DOB: 1950-10-17 MRN: [id number]   Date of Service  06/22/2019  HPI/Events of Note  Pt. Converted into a flutter per EKG, rate controlled 80-110. Mag is 2.1 Calcium corrects to 9.1  eICU Interventions  Bolus with 150 mg amiodarone . Will check Mag and ionized calcium in am     Intervention Category Major Interventions: Arrhythmia - evaluation and management  Magdalen Spatz 06/22/2019, 10:11 PM

## 2019-06-22 NOTE — Progress Notes (Signed)
  [facility] Progress Note   Assessment/ Plan:   Dialysis Orders:  [facility] M[facility] 3.75h 400/800 EDW 73kg 2K/3.5Ca UFP 4   AVF Hep 3300 No VDRA/ESA  OP Labs 06/10/19: Hgb 12.5 Ca 8.0 Phos 3.9 Alb 3.4  PTH 1743  Assessment/Plan: 1. Septic shock - Blood cultures + for Enterobacter species citrobacter, has likely splenic infarcts on CT abd/ pelvis and distended bladder.  Initially had distended bladder and thought to be stool on micro eval, CT with rectal contrast negative.  Urology consulted, dx'd with pyocystitis, has Foley now.  Pressors and antibiotics per PCCM 2. AMS: CT head negative, has aneurysmal dilatation of bilateral cervical ICAs.  ? Delirium playing into this 3. Thrombocytopenia: plts down to 13 and 15, has no schistocytes on smear, LDH 209, haptoglobin neg, not consistent with TTP/HUS, suspect sepsis related. 4. ESRD -  HD MWF. Last HD 2/10. Continue CRRT, with no anticoagulant, net neg 50 mL/ hr 5. Hypotension/volume  - Remains hypotensive. On midodrine, pressors, stress dose steroids 6. Anemia  - Hgb 13. No ESA needed.  7. Metabolic bone disease -  Ca ok. Low Phos. Hold Velphoro binder for now. On Sensipar.  8. Dispo: critically ill in ICU  Subjective:    No rectovesicular fistula on CT.  Remains on CRRT.  Platelets still low.  Pressors coming down slightly.     Objective:   BP 120/60   Pulse (!) 107   Temp 98.2 F (36.8 C)   Resp 10   Ht 6\' 4"  (1.93 m)   Wt 79.1 kg   SpO2 100%   BMI 21.23 kg/m   Physical Exam: Gen: sitting in bed, appears ill CVS: RRR no m/r/g Resp: normal WOB, clear anteriorly Abd: soft, some mild suprapubic tenderness to palpation, improved Ext: 1+ anasarca ACCESS: RUE AVF +T/B, aneurysmal, L IJ trialysis catheter NEURO: awake, staring off into space, doesn't really respond.    Labs: BMET Recent Labs  Lab 06/19/19 0556 06/19/19 1822 06/20/19 0312 06/20/19 1600 06/21/19 0342 06/21/19 1520 06/22/19 0319  NA 135 139 137  139 137 135 137  K 4.7 4.5 5.2* 5.2* 4.9 4.6 4.6  CL 97* 102 103 103 105 102 103  CO2 20* 20* 17* 18* 18* 20* 21*  GLUCOSE 100* 104* 131* 143* 126* 148* 121*  BUN 86* 82* 85* 71* 56* 44* 36*  CREATININE 9.14* 8.11* 8.70* 6.42* 5.24* 3.81* 3.15*  CALCIUM 8.1* 7.8* 8.0* 7.8* 7.8* 7.9* 7.3*  PHOS 2.3*  --  4.4 3.8 3.8 3.1 2.6   CBC Recent Labs  Lab 06/19/19 0000 06/19/19 0556 06/20/19 0312 06/22/19 0319  WBC 25.7* 19.2* 26.6* 16.5*  NEUTROABS 23.4*  --   --  14.9*  HGB 13.1 13.0 11.1* 10.7*  HCT 38.3* 37.2* 32.4* 31.1*  MCV 87.2 85.7 85.0 85.2  PLT 17* 13*  13* 15* 15*      Medications:    . Chlorhexidine Gluconate Cloth  6 each Topical Q0600  . cinacalcet  90 mg Oral QPC supper  . dextrose  1 ampule Intravenous Once  . hydrocortisone sod succinate (SOLU-CORTEF) inj  50 mg Intravenous Q6H  . insulin aspart  5 Units Intravenous Once  . midodrine  10 mg Oral TID WC  . tamsulosin  0.4 mg Oral Daily  . Thrombi-Pad  1 each Topical Once     Madelon Lips MD 06/22/2019, 9:11 AM

## 2019-06-22 NOTE — Progress Notes (Signed)
  Speech Language Pathology Treatment: Dysphagia  Patient Details Name: Gregory [name] MRN: [id number] DOB: December 02, 1950 Today's Date: 06/22/2019 Time: 8184-0375 SLP Time Calculation (min) (ACUTE ONLY): 9 min  Assessment / Plan / Recommendation Clinical Impression  RN reported intake has been minimal. He was awake, eyes open but SLP could not stimulate him to engage verbally to assess for vocal quality during intake. Regarding his processing there were times when he responded yes/no with head gestures within several seconds and others were delayed or no response. Minimal sips thin via straw and applesauce revealed delayed oral, possible pharyngeal transit without outward aspiration indicators. Contacted RD re: concern for poor intake who stated she plans to follow up. He should continue puree texture/thin and hopes for increased intake once medical interventions take effect and he feels better.    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  Work-up suggest sepsis without clear source.  CXR on 2/14 reported: "Cardiomegaly.  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       SLP Plan  Continue with current plan of care       Recommendations  Diet recommendations: Dysphagia 1 (puree);Thin liquid Liquids provided via: Straw Medication Administration: Crushed with puree Supervision: Full supervision/cueing for compensatory strategies;Staff to assist with self feeding Compensations: Small sips/bites Postural Changes and/or Swallow Maneuvers: Seated upright 90 degrees                Oral Care Recommendations: Oral care BID Follow up Recommendations: 24 hour supervision/assistance;Skilled  Nursing facility SLP Visit Diagnosis: Dysphagia, oral phase (R13.11) Plan: Continue with current plan of care       GO                Houston Siren 06/22/2019, 10:40 AM   Orbie Pyo Colvin Caroli.Ed Risk analyst [phone] Office [phone]

## 2019-06-23 DIAGNOSIS — E43 Unspecified severe protein-calorie malnutrition: Secondary | ICD-10-CM | POA: Insufficient documentation

## 2019-06-23 LAB — RENAL FUNCTION PANEL
Albumin: 1.9 g/dL — ABNORMAL LOW (ref 3.5–5.0)
Albumin: 2.2 g/dL — ABNORMAL LOW (ref 3.5–5.0)
Anion gap: 12 (ref 5–15)
Anion gap: 9 (ref 5–15)
BUN: 22 mg/dL (ref 8–23)
BUN: 18 mg/dL (ref 8–23)
CO2: 22 mmol/L (ref 22–32)
CO2: 23 mmol/L (ref 22–32)
Calcium: 7.2 mg/dL — ABNORMAL LOW (ref 8.9–10.3)
Calcium: 7.2 mg/dL — ABNORMAL LOW (ref 8.9–10.3)
Chloride: 103 mmol/L (ref 98–111)
Chloride: 103 mmol/L (ref 98–111)
Creatinine, Ser: 1.87 mg/dL — ABNORMAL HIGH (ref 0.61–1.24)
Creatinine, Ser: 1.82 mg/dL — ABNORMAL HIGH (ref 0.61–1.24)
GFR calc Af Amer: 41 mL/min — ABNORMAL LOW (ref >60)
GFR calc Af Amer: 43 mL/min — ABNORMAL LOW (ref >60)
GFR calc non Af Amer: 36 mL/min — ABNORMAL LOW (ref >60)
GFR calc non Af Amer: 37 mL/min — ABNORMAL LOW (ref >60)
Glucose, Bld: 107 mg/dL — ABNORMAL HIGH (ref 70–99)
Glucose, Bld: 112 mg/dL — ABNORMAL HIGH (ref 70–99)
Phosphorus: 2 mg/dL — ABNORMAL LOW (ref 2.5–4.6)
Phosphorus: 2.9 mg/dL (ref 2.5–4.6)
Potassium: 4.2 mmol/L (ref 3.5–5.1)
Potassium: 4.6 mmol/L (ref 3.5–5.1)
Sodium: 137 mmol/L (ref 135–145)
Sodium: 135 mmol/L (ref 135–145)

## 2019-06-23 LAB — CBC
HCT: 30.5 % — ABNORMAL LOW (ref 39.0–52.0)
Hemoglobin: 10.3 g/dL — ABNORMAL LOW (ref 13.0–17.0)
MCH: 28.8 pg (ref 26.0–34.0)
MCHC: 33.8 g/dL (ref 30.0–36.0)
MCV: 85.2 fL (ref 80.0–100.0)
Platelets: 15 10*3/uL — CL (ref 150–400)
RBC: 3.58 MIL/uL — ABNORMAL LOW (ref 4.22–5.81)
RDW: 16 % — ABNORMAL HIGH (ref 11.5–15.5)
WBC: 15.1 10*3/uL — ABNORMAL HIGH (ref 4.0–10.5)
nRBC: 0 % (ref 0.0–0.2)

## 2019-06-23 LAB — GLUCOSE, CAPILLARY
Glucose-Capillary: 100 mg/dL — ABNORMAL HIGH (ref 70–99)
Glucose-Capillary: 104 mg/dL — ABNORMAL HIGH (ref 70–99)
Glucose-Capillary: 112 mg/dL — ABNORMAL HIGH (ref 70–99)
Glucose-Capillary: 94 mg/dL (ref 70–99)
Glucose-Capillary: 95 mg/dL (ref 70–99)
Glucose-Capillary: 96 mg/dL (ref 70–99)

## 2019-06-23 LAB — MAGNESIUM: Magnesium: 2.4 mg/dL (ref 1.7–2.4)

## 2019-06-23 MED ORDER — ALBUMIN HUMAN 25 % IV SOLN
25.0000 g | Freq: Four times a day (QID) | INTRAVENOUS | Status: AC
  Administered 2019-06-24 (×2): 25 g via INTRAVENOUS
  Filled 2019-06-23 (×3): qty 100

## 2019-06-23 MED ORDER — SODIUM PHOSPHATES 45 MMOLE/15ML IV SOLN
20.0000 mmol | Freq: Once | INTRAVENOUS | Status: AC
  Administered 2019-06-23: 20 mmol via INTRAVENOUS
  Filled 2019-06-23: qty 6.67

## 2019-06-23 MED ORDER — ALBUMIN HUMAN 25 % IV SOLN
25.0000 g | Freq: Four times a day (QID) | INTRAVENOUS | Status: DC
  Administered 2019-06-23 (×2): 25 g via INTRAVENOUS
  Filled 2019-06-23: qty 100

## 2019-06-23 MED ORDER — AMIODARONE HCL IN DEXTROSE 360-4.14 MG/200ML-% IV SOLN: 60.0000 mg/h | INTRAVENOUS | Status: DC

## 2019-06-23 MED ORDER — AMIODARONE HCL IN DEXTROSE 360-4.14 MG/200ML-% IV SOLN: 30.0000 mg/h | INTRAVENOUS | Status: DC

## 2019-06-23 MED ORDER — ENSURE ENLIVE PO LIQD: 237.0000 mL | Freq: Two times a day (BID) | ORAL | Status: DC

## 2019-06-23 MED ORDER — METOPROLOL TARTRATE 5 MG/5ML IV SOLN
2.5000 mg | Freq: Once | INTRAVENOUS | Status: AC
  Administered 2019-06-23: 01:00:00 2.5 mg via INTRAVENOUS
  Filled 2019-06-23: qty 5

## 2019-06-23 NOTE — Progress Notes (Signed)
[facility] Progress Note   Assessment/ Plan:   Dialysis Orders:  [facility] 3.75h 400/800 EDW 73kg 2K/3.5Ca UFP 4   AVF Hep 3300 No VDRA/ESA  OP Labs 06/10/19: Hgb 12.5 Ca 8.0 Phos 3.9 Alb 3.4  PTH 1743  Assessment/Plan: 1. Septic shock - Blood cultures + for Enterobacter species citrobacter, has likely splenic infarcts on CT abd/ pelvis and distended bladder.  Initially had distended bladder and thought to be stool on micro eval, CT with rectal contrast negative.  Urology consulted, dx'd with pyocystitis, has Foley now.  Pressors and antibiotics per PCCM 2. AMS: CT head negative, has aneurysmal dilatation of bilateral cervical ICAs.  ? Delirium playing into this 3. Thrombocytopenia: plts down to 13 and 15, has no schistocytes on smear, LDH 209, haptoglobin neg, not consistent with TTP/HUS, suspect sepsis related. 4. ESRD -  HD MWF. Last HD 2/10. Continue CRRT, with no anticoagulant, net neg 50 mL/ hr 5. Hypotension/volume  - Remains hypotensive. On midodrine, pressors, stress dose steroids 6. Anemia  - no need for ESA now, will follow and add as appropriate 7. Aflutter: improved, per primary 8. Metabolic bone disease -  Ca ok. Low Phos. Hold Velphoro binder for now. On Sensipar.  9. Dispo: critically ill in ICU  Subjective:    Aflutter overnight to 150s, didn't break with amio, got 2.5 mg IV metop, now better in the 110s.  Able to respond to some questions today but still overall encephalopathic.   Objective:   BP (!) 117/57   Pulse (!) 118   Temp 97.9 F (36.6 C)   Resp 14   Ht 6\' 4"  (1.93 m)   Wt 78.5 kg   SpO2 100%   BMI 21.07 kg/m   Physical Exam: Gen: sitting in bed, appears ill CVS: RRR no m/r/g Resp: normal WOB, clear anteriorly Abd: soft, some mild suprapubic tenderness to palpation, improved GU: Foley draining minimal amount of milky fluid Ext: 1+ anasarca ACCESS: RUE AVF +T/B, aneurysmal, L IJ trialysis catheter NEURO: awake, staring off into  space, slow to respond but an improvement  Labs: BMET Recent Labs  Lab 06/20/19 0312 06/20/19 1600 06/21/19 0342 06/21/19 1520 06/22/19 0319 06/22/19 1550 06/23/19 0306  NA 137 139 137 135 137 138 137  K 5.2* 5.2* 4.9 4.6 4.6 4.5 4.2  CL 103 103 105 102 103 106 103  CO2 17* 18* 18* 20* 21* 21* 22  GLUCOSE 131* 143* 126* 148* 121* 123* 107*  BUN 85* 71* 56* 44* 36* 27* 22  CREATININE 8.70* 6.42* 5.24* 3.81* 3.15* 2.49* 1.87*  CALCIUM 8.0* 7.8* 7.8* 7.9* 7.3* 7.4* 7.2*  PHOS 4.4 3.8 3.8 3.1 2.6 2.1* 2.0*   CBC Recent Labs  Lab 06/19/19 0000 06/19/19 0000 06/19/19 0556 06/20/19 0312 06/22/19 0319 06/23/19 0306  WBC 25.7*   < > 19.2* 26.6* 16.5* 15.1*  NEUTROABS 23.4*  --   --   --  14.9*  --   HGB 13.1   < > 13.0 11.1* 10.7* 10.3*  HCT 38.3*   < > 37.2* 32.4* 31.1* 30.5*  MCV 87.2   < > 85.7 85.0 85.2 85.2  PLT 17*   < > 13*  13* 15* 15* 15*   < > = values in this interval not displayed.      Medications:    . Chlorhexidine Gluconate Cloth  6 each Topical Daily  . cinacalcet  90 mg Oral QPC supper  . dextrose  1 ampule Intravenous Once  .  feeding supplement (NEPRO CARB STEADY)  237 mL Oral TID BM  . hydrocortisone sod succinate (SOLU-CORTEF) inj  50 mg Intravenous Q6H  . insulin aspart  5 Units Intravenous Once  . midodrine  10 mg Oral TID WC  . tamsulosin  0.4 mg Oral Daily  . Thrombi-Pad  1 each Topical Once     Madelon Lips MD 06/23/2019, 9:20 AM

## 2019-06-23 NOTE — Progress Notes (Signed)
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  Sepsis suspected, patient had in-out urinary cath which produced stool. On norepi, phenylephrine, and vasopressin.  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  His wife reports that he is normally alert and oriented, but seemed to be "spaced out" and was having a hard time answering questions.  He had no URI symptoms, fever, diarrhea, known ill contacts or obvious focal neuro deficits.  He missed dialysis on 2/13 and when he was not improving his wife called EMS.  Work-up in the ED revealed leukocytosis of 25k, thrombocytopenia, lactic acid of 3.2, bilirubin 2.4 with normal LFTs.  He received 3.5 L IV fluids and empiric cefepime, vancomycin, Flagyl after which his blood pressure remained borderline and mental status only slightly improved.   Patient will answer questions and mentions left-sided back pain, denies abdominal pain. CT head was ordered and PCCM consulted for admission.  Covid-19 negative.  Past Medical History   has a past medical history of Anemia in chronic kidney disease, Arthritis, CHF (congestive heart failure) ([place]), Chronic back pain, End stage renal disease ([place]), GERD (gastroesophageal reflux disease), Gout, Heart failure  with reduced ejection fraction ([place]), and Hypertension. celiac and superior mesenteric artery aneurysms   Significant Hospital Events   2/14 Admit to [facility] 2/14 He developed SVT, was hemodynamically unstable, treated initially with adenosine but ultimately required DCCV around 715. Has remained hypotensive, received albumin and now another 2 L IV fluid resuscitation (5 L total). Interacting but confused, still not back to baseline per wife at bedside 2/15 Stool-like produced from in-out urinary cath 2/16 Urology consult, confirmed pyocystitis and drained bladder   Consults:  nephrology  Procedures:  2/14 left IJ HD cath insertion 2/16 Bedside bladder aspiration  Significant Diagnostic Tests:  2/14 CXR>>Cardiomegaly.  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  Chronic elevation of the left hemidiaphragm with chronic volume loss at the left lung base.  2/14 CT Abd 1. No acute pulmonary embolism. 2. Small bilateral pleural effusions with adjacent atelectasis. 3. Dilated main pulmonary artery, which may be secondary to pulmonary arterial hypertension. 4. Interval development of wedge-shaped defects involving the spleen, which may represent splenic infarcts. 5. Cholelithiasis with mild gallbladder wall thickening and pericholecystic free fluid, which may be secondary to underlying liver disease. If there is clinical concern for acute cholecystitis, follow-up with ultrasound is recommended. 6. 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 7. 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 8. Additional chronic findings as detailed above. 9. Aortic Atherosclerosis (ICD10-I70.0).  2/16 CT Pelvis with Rectal contrast 1. Negative for colovesicular fistula. Rectal contrast only opacifies the colon. 2. Bladder wall thickening likely from chronic outlet obstruction. The prostate is  enlarged. 3. Prominent anasarca.   CT Head  2/14 No acute intracranial finding. Atrophy. Chronic small-vessel ischemic changes. 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  Echo 2/14 . Left ventricular ejection fraction, by estimation, is 60 to 65%. The  left ventricle has normal function. The left ventricle has no regional  wall motion abnormalities. Left ventricular diastolic parameters are  indeterminate.   2. Right ventricular systolic function is normal. The right ventricular  size is normal.   3. Left atrial size was severely dilated.   4. Right atrial size was mildly dilated.   5. The mitral valve is degenerative. Mild mitral valve regurgitation. No  evidence of mitral stenosis.   6. The aortic valve is tricuspid. Aortic valve regurgitation is mild to  moderate. Mild aortic valve stenosis.   7. The inferior vena cava is normal in size with greater than 50%  respiratory variability, suggesting right atrial pressure of 3 mmHg.   RUQ U/S 2/15 1. Both the gallbladder and the CBD appear mildly distended with sludge but no stone identified. No intrahepatic biliary ductal dilatation to strongly suggest acute bile duct obstruction. Some gallbladder wall thickening, although no sonographic Murphy sign elicited to strongly suggest acalculous cholecystitis. 2. If abdominal pain continues then a nuclear medicine hepatobiliary scan would be valuable to exclude acute CBD or cystic duct obstruction.    Micro Data:  2/14 respiratory viral panel>> negative 2/14 blood cultures x2>> Citrobacter, sensitivities below 2/15 UCx Citrobacter  2/15 UCx G(-) rods  Antimicrobials:  Flagyl 2/14 only Cefepime 2/14-d/c 2/15 Vancomycin 2/14-d/c2/15 Meropenem 2/15 -> d/c 2/16 Ceftx 2/16 -->  Interim history/subjective:  Pressor requirement improving.  Remains on CRRT.  Denies  pain but not really vocalizing.  Per nursing was talking last night, very confused.  Objective   Blood pressure (!) 114/52, pulse (!) 118, temperature 98.4 F (36.9 C), resp. rate 12, height 6\' 4"  (1.93 m), weight 78.5 kg, SpO2 100 %.        Intake/Output Summary (Last 24 hours) at 06/23/2019 1018 Last data filed at 06/23/2019 1000 Gross per 24 hour  Intake 634.55 ml  Output 1685 ml  Net -1050.45 ml   Filed Weights   06/21/19 0439 06/22/19 0500 06/23/19 0500  Weight: 81.2 kg 79.1 kg 78.5 kg   GEN: cachetic man in NAD HEENT: MMM, trachea midline CV: Tachycardic, ext warm PULM: Clear, no accessory muscle use GI: Soft, minimal TTP over suprapubic area EXT: Trace edema NEURO: Moves all 4 ext to command but sluggishly PSYCH: not talking to me SKIN: No rashes   Resolved Hospital Problem list     Assessment & Plan:  Citrobacter septic shock thought originating from bladder I suppose ascending to pyelonephritis? - Ceftriaxone x 14 days - Foley okay to come out - Wean pressors - Switch CRRT to goal even to see if we can get off pressors; I think he's a bit dry, albumin ordered  ESRD- on CRRT, appreciate Dr. Bishop Dublin help  Metabolic encephalopathy- re-orient as able, encourage day/night cycles  Severe thrombocytopenia- present on admission, stable, mild DIC + sepsis, no indication to transfuse at present, monitor for now  ?aflutter overnight- currently in sinus, platelets preclude AC  Possible renal cell cancer- urology OP f/u  Protein calorie malnutrition, dysphagia- continue dysphagia 1 diet, add ensure  Celiac and superior mesenteric artery aneurysms-On CT scanning 05/2019 aneurysmal dilatation of bilateral common iliac arteries again noted. Left measures 3.5 cm in  diameter, R 3.3 cm.  Follows outpatient with vascular surgery  Best practice:  Diet: Dysphagia 1 diet Pain/Anxiety/Delirium protocol (if indicated): N/A VAP protocol (if indicated): N/A DVT prophylaxis:  SCDs GI prophylaxis: N/A Glucose control: N/A Mobility: Bedrest Code Status: Full code Family Communication: will try to update today Disposition: ICU pending CRRT and pressor liberation   The patient is critically ill with multiple organ systems failure and requires high complexity decision making for assessment and support, frequent evaluation and titration of therapies, application of advanced monitoring technologies and extensive interpretation of multiple databases. Critical Care Time devoted to patient care services described in this note independent of APP/resident time (if applicable)  is 32 minutes.   Erskine Emery MD [facility] 06/23/2019 10:38 AM Personal pager: [phone] If unanswered, please page CCM On-call: [phone]

## 2019-06-23 NOTE — Progress Notes (Signed)
Verbal orders to keep pt net even per Dr. Tamala Julian

## 2019-06-23 NOTE — Progress Notes (Signed)
eLink Physician-Brief Progress Note Patient Name: Gregory [name] DOB: 1950-12-05 MRN: [id number]   Date of Service  06/23/2019  HPI/Events of Note  Pt with atrial flutter that did not respond to 150 mg iv Amiodarone x 1, electrolyte labs are pending.  eICU Interventions  Metoprolol 2.5 mg iv x 1, follow up electrolyte lab results when available.        Kerry Kass Rashied Corallo 06/23/2019, 12:18 AM

## 2019-06-24 LAB — RENAL FUNCTION PANEL
Albumin: 2.8 g/dL — ABNORMAL LOW (ref 3.5–5.0)
Albumin: 2.9 g/dL — ABNORMAL LOW (ref 3.5–5.0)
Anion gap: 12 (ref 5–15)
Anion gap: 9 (ref 5–15)
BUN: 18 mg/dL (ref 8–23)
BUN: 15 mg/dL (ref 8–23)
CO2: 23 mmol/L (ref 22–32)
CO2: 25 mmol/L (ref 22–32)
Calcium: 7.4 mg/dL — ABNORMAL LOW (ref 8.9–10.3)
Calcium: 7.4 mg/dL — ABNORMAL LOW (ref 8.9–10.3)
Chloride: 104 mmol/L (ref 98–111)
Chloride: 102 mmol/L (ref 98–111)
Creatinine, Ser: 1.65 mg/dL — ABNORMAL HIGH (ref 0.61–1.24)
Creatinine, Ser: 1.36 mg/dL — ABNORMAL HIGH (ref 0.61–1.24)
GFR calc Af Amer: 48 mL/min — ABNORMAL LOW (ref >60)
GFR calc Af Amer: >60 mL/min (ref >60)
GFR calc non Af Amer: 41 mL/min — ABNORMAL LOW (ref >60)
GFR calc non Af Amer: 52 mL/min — ABNORMAL LOW (ref >60)
Glucose, Bld: 108 mg/dL — ABNORMAL HIGH (ref 70–99)
Glucose, Bld: 145 mg/dL — ABNORMAL HIGH (ref 70–99)
Phosphorus: 2.2 mg/dL — ABNORMAL LOW (ref 2.5–4.6)
Phosphorus: 2.4 mg/dL — ABNORMAL LOW (ref 2.5–4.6)
Potassium: 4.2 mmol/L (ref 3.5–5.1)
Potassium: 3.7 mmol/L (ref 3.5–5.1)
Sodium: 139 mmol/L (ref 135–145)
Sodium: 136 mmol/L (ref 135–145)

## 2019-06-24 LAB — CALCIUM, IONIZED: Calcium, Ionized, Serum: 4.2 mg/dL — ABNORMAL LOW (ref 4.5–5.6)

## 2019-06-24 LAB — CBC
HCT: 26.3 % — ABNORMAL LOW (ref 39.0–52.0)
Hemoglobin: 8.8 g/dL — ABNORMAL LOW (ref 13.0–17.0)
MCH: 29.4 pg (ref 26.0–34.0)
MCHC: 33.5 g/dL (ref 30.0–36.0)
MCV: 88 fL (ref 80.0–100.0)
Platelets: 18 10*3/uL — CL (ref 150–400)
RBC: 2.99 MIL/uL — ABNORMAL LOW (ref 4.22–5.81)
RDW: 16 % — ABNORMAL HIGH (ref 11.5–15.5)
WBC: 14.5 10*3/uL — ABNORMAL HIGH (ref 4.0–10.5)
nRBC: 0 % (ref 0.0–0.2)

## 2019-06-24 LAB — GLUCOSE, CAPILLARY
Glucose-Capillary: 98 mg/dL (ref 70–99)
Glucose-Capillary: 100 mg/dL — ABNORMAL HIGH (ref 70–99)
Glucose-Capillary: 109 mg/dL — ABNORMAL HIGH (ref 70–99)
Glucose-Capillary: 121 mg/dL — ABNORMAL HIGH (ref 70–99)
Glucose-Capillary: 59 mg/dL — ABNORMAL LOW (ref 70–99)
Glucose-Capillary: 138 mg/dL — ABNORMAL HIGH (ref 70–99)

## 2019-06-24 LAB — MAGNESIUM: Magnesium: 2.3 mg/dL (ref 1.7–2.4)

## 2019-06-24 MED ORDER — DARBEPOETIN ALFA 60 MCG/0.3ML IJ SOSY
60.0000 ug | PREFILLED_SYRINGE | INTRAMUSCULAR | Status: DC
  Administered 2019-06-24 – 2019-07-15 (×3): 60 ug via SUBCUTANEOUS
  Filled 2019-06-24 (×4): qty 0.3

## 2019-06-24 MED ORDER — SODIUM PHOSPHATES 45 MMOLE/15ML IV SOLN
20.0000 mmol | Freq: Once | INTRAVENOUS | Status: AC
  Administered 2019-06-24: 20 mmol via INTRAVENOUS
  Filled 2019-06-24: qty 6.67

## 2019-06-24 NOTE — Progress Notes (Signed)
  Speech Language Pathology Treatment: Dysphagia  Patient Details Name: Gregory [name] MRN: [id number] DOB: 06-Apr-1951 Today's Date: 06/24/2019 Time: 9622-2979 SLP Time Calculation (min) (ACUTE ONLY): 21 min  Assessment / Plan / Recommendation Clinical Impression  Pt's alertness improved and RN present assisting with breakfast (puree) when therapist arrived. He can self feed but needed moderate assist to initiate feeding and manipulate utensils given cognitive deficits/delayed processing. He initiated verbalization revealing wet vocal quality suspicious for possible penetration/aspiration from subjective view. Quality clears with prompts. He demonstrated ability to take single small sips after reminders- not likely able to carry over. Mildly delayed transit with trial regular texture which is improved from 2/17 and therapist will upgrade texture to Dys 2 (minced-ground), continue thin, crush pills and full supervision and assist. As mentation and endurance improves he should be able to upgrade texture. If s/s aspiration persist or medical status declines, would perform instrumental testing at that time.    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  Work-up suggest sepsis without clear source.  CXR on 2/14 reported: "Cardiomegaly.  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       SLP Plan  Continue with current plan of care       Recommendations  Diet recommendations: Dysphagia 2 (fine chop);Thin liquid Liquids provided via: Straw Medication Administration: Crushed with puree Supervision: Patient able to self feed;Staff to assist with self feeding;Full supervision/cueing for compensatory  strategies Compensations: Small sips/bites;Lingual sweep for clearance of pocketing;Clear throat intermittently Postural Changes and/or Swallow Maneuvers: Seated upright 90 degrees                Oral Care Recommendations: Oral care BID Follow up Recommendations: 24 hour supervision/assistance;Skilled Nursing facility SLP Visit Diagnosis: Dysphagia, oral phase (R13.11) Plan: Continue with current plan of care       GO                Houston Siren 06/24/2019, 9:36 AM  Orbie Pyo Colvin Caroli.Ed Risk analyst [phone] Office [phone]

## 2019-06-24 NOTE — Progress Notes (Signed)
[facility] Progress Note   Assessment/ Plan:   Dialysis Orders:  [facility] M[facility] 3.75h 400/800 EDW 73kg 2K/3.5Ca UFP 4   AVF Hep 3300 No VDRA/ESA  OP Labs 06/10/19: Hgb 12.5 Ca 8.0 Phos 3.9 Alb 3.4  PTH 1743  Assessment/Plan: 1. Septic shock - Blood cultures + for Enterobacter species citrobacter, has likely splenic infarcts on CT abd/ pelvis and distended bladder.  Initially had distended bladder and thought to be stool on micro eval, CT with rectal contrast negative.  Urology consulted, dx'd with pyocystitis, has Foley now.  Pressors and antibiotics per PCCM--> being weaned 2. AMS: CT head negative, has aneurysmal dilatation of bilateral cervical ICAs.  ? Delirium playing into this 3. Thrombocytopenia: plts down to 13 and 15, has no schistocytes on smear, LDH 209, haptoglobin neg, not consistent with TTP/HUS, suspect sepsis related. 4. ESRD -  HD MWF. Last HD 2/10. Continue CRRT, with no anticoagulant, keep even today 5. Hypotension/volume  - Remains hypotensive. On midodrine, pressors, stress dose steroids 6. Anemia  - no need for ESA now, will follow and add as appropriate--> will add darbe today 7. Aflutter: improved, per primary 8. Metabolic bone disease -  Ca ok. Low Phos. Hold Velphoro binder for now. On Sensipar.  9. Dispo: critically ill in ICU  Subjective:    Another episode of Aflutter last night.  Filter clotted x 2 yesterday, plts 18.  Has been running well since second filter change.  More awake and alert.     Objective:   BP (!) 102/58   Pulse 85   Temp (!) 95.7 F (35.4 C)   Resp 15   Ht 6\' 4"  (1.93 m)   Wt 78.5 kg   SpO2 100%   BMI 21.07 kg/m   Physical Exam: Gen: sitting in bed, appears ill, more interactive. CVS: RRR no m/r/g Resp: normal WOB, clear anteriorly Abd: soft, some mild suprapubic tenderness to palpation, improved GU: Foley draining minimal amount of milky fluid Ext: anasarca improved ACCESS: RUE AVF +T/B, aneurysmal, L IJ trialysis  catheter NEURO: awake, staring off into space, slow to respond but an improvement  Labs: BMET Recent Labs  Lab 06/21/19 0342 06/21/19 1520 06/22/19 0319 06/22/19 1550 06/23/19 0306 06/23/19 1600 06/24/19 0345  NA 137 135 137 138 137 135 139  K 4.9 4.6 4.6 4.5 4.2 4.6 4.2  CL 105 102 103 106 103 103 104  CO2 18* 20* 21* 21* 22 23 23   GLUCOSE 126* 148* 121* 123* 107* 112* 108*  BUN 56* 44* 36* 27* 22 18 18   CREATININE 5.24* 3.81* 3.15* 2.49* 1.87* 1.82* 1.65*  CALCIUM 7.8* 7.9* 7.3* 7.4* 7.2* 7.2* 7.4*  PHOS 3.8 3.1 2.6 2.1* 2.0* 2.9 2.2*   CBC Recent Labs  Lab 06/19/19 0000 06/19/19 0556 06/20/19 0312 06/22/19 0319 06/23/19 0306 06/24/19 0345  WBC 25.7*   < > 26.6* 16.5* 15.1* 14.5*  NEUTROABS 23.4*  --   --  14.9*  --   --   HGB 13.1   < > 11.1* 10.7* 10.3* 8.8*  HCT 38.3*   < > 32.4* 31.1* 30.5* 26.3*  MCV 87.2   < > 85.0 85.2 85.2 88.0  PLT 17*   < > 15* 15* 15* 18*   < > = values in this interval not displayed.      Medications:    . Chlorhexidine Gluconate Cloth  6 each Topical Daily  . cinacalcet  90 mg Oral QPC supper  . feeding supplement (  ENSURE ENLIVE)  237 mL Oral BID BM  . feeding supplement (NEPRO CARB STEADY)  237 mL Oral TID BM  . hydrocortisone sod succinate (SOLU-CORTEF) inj  50 mg Intravenous Q6H  . insulin aspart  5 Units Intravenous Once  . midodrine  10 mg Oral TID WC  . tamsulosin  0.4 mg Oral Daily  . Thrombi-Pad  1 each Topical Once     Madelon Lips MD 06/24/2019, 10:36 AM

## 2019-06-24 NOTE — Progress Notes (Signed)
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  Sepsis suspected, patient had in-out urinary cath which produced stool. On norepi, phenylephrine, and vasopressin.  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  His wife reports that he is normally alert and oriented, but seemed to be "spaced out" and was having a hard time answering questions.  He had no URI symptoms, fever, diarrhea, known ill contacts or obvious focal neuro deficits.  He missed dialysis on 2/13 and when he was not improving his wife called EMS.  Work-up in the ED revealed leukocytosis of 25k, thrombocytopenia, lactic acid of 3.2, bilirubin 2.4 with normal LFTs.  He received 3.5 L IV fluids and empiric cefepime, vancomycin, Flagyl after which his blood pressure remained borderline and mental status only slightly improved.   Patient will answer questions and mentions left-sided back pain, denies abdominal pain. CT head was ordered and PCCM consulted for admission.  Covid-19 negative.  Past Medical History   has a past medical history of Anemia in chronic kidney disease, Arthritis, CHF (congestive heart failure) ([place]), Chronic back pain, End stage renal disease ([place]), GERD (gastroesophageal reflux disease), Gout, Heart failure  with reduced ejection fraction ([place]), and Hypertension. celiac and superior mesenteric artery aneurysms   Significant Hospital Events   2/14 Admit to [facility] 2/14 He developed SVT, was hemodynamically unstable, treated initially with adenosine but ultimately required DCCV around 715. Has remained hypotensive, received albumin and now another 2 L IV fluid resuscitation (5 L total). Interacting but confused, still not back to baseline per wife at bedside 2/15 Stool-like produced from in-out urinary cath 2/16 Urology consult, confirmed pyocystitis and drained bladder   Consults:  nephrology  Procedures:  2/14 left IJ HD cath insertion 2/16 Bedside bladder aspiration  Significant Diagnostic Tests:  2/14 CXR>>Cardiomegaly.  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  Chronic elevation of the left hemidiaphragm with chronic volume loss at the left lung base.  2/14 CT Abd 1. No acute pulmonary embolism. 2. Small bilateral pleural effusions with adjacent atelectasis. 3. Dilated main pulmonary artery, which may be secondary to pulmonary arterial hypertension. 4. Interval development of wedge-shaped defects involving the spleen, which may represent splenic infarcts. 5. Cholelithiasis with mild gallbladder wall thickening and pericholecystic free fluid, which may be secondary to underlying liver disease. If there is clinical concern for acute cholecystitis, follow-up with ultrasound is recommended. 6. 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 7. 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 8. Additional chronic findings as detailed above. 9. Aortic Atherosclerosis (ICD10-I70.0).  2/16 CT Pelvis with Rectal contrast 1. Negative for colovesicular fistula. Rectal contrast only opacifies the colon. 2. Bladder wall thickening likely from chronic outlet obstruction. The prostate is  enlarged. 3. Prominent anasarca.   CT Head  2/14 No acute intracranial finding. Atrophy. Chronic small-vessel ischemic changes. 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  Echo 2/14 . Left ventricular ejection fraction, by estimation, is 60 to 65%. The  left ventricle has normal function. The left ventricle has no regional  wall motion abnormalities. Left ventricular diastolic parameters are  indeterminate.   2. Right ventricular systolic function is normal. The right ventricular  size is normal.   3. Left atrial size was severely dilated.   4. Right atrial size was mildly dilated.   5. The mitral valve is degenerative. Mild mitral valve regurgitation. No  evidence of mitral stenosis.   6. The aortic valve is tricuspid. Aortic valve regurgitation is mild to  moderate. Mild aortic valve stenosis.   7. The inferior vena cava is normal in size with greater than 50%  respiratory variability, suggesting right atrial pressure of 3 mmHg.   RUQ U/S 2/15 1. Both the gallbladder and the CBD appear mildly distended with sludge but no stone identified. No intrahepatic biliary ductal dilatation to strongly suggest acute bile duct obstruction. Some gallbladder wall thickening, although no sonographic Murphy sign elicited to strongly suggest acalculous cholecystitis. 2. If abdominal pain continues then a nuclear medicine hepatobiliary scan would be valuable to exclude acute CBD or cystic duct obstruction.    Micro Data:  2/14 respiratory viral panel>> negative 2/14 blood cultures x2>> Citrobacter, sensitivities below 2/15 UCx Citrobacter pansensitive 2/15 UCx G(-) rods  Antimicrobials:  Flagyl 2/14 only Cefepime 2/14-d/c 2/15 Vancomycin 2/14-d/c2/15 Meropenem 2/15 -> d/c 2/16 Ceftx 2/16 -->  Interim history/subjective:  Remains on CRRT.  No real  progress.  Objective   Blood pressure 110/63, pulse 85, temperature (!) 95.7 F (35.4 C), resp. rate 15, height 6\' 4"  (1.93 m), weight 78.5 kg, SpO2 100 %.        Intake/Output Summary (Last 24 hours) at 06/24/2019 1106 Last data filed at 06/24/2019 1100 Gross per 24 hour  Intake 1184.74 ml  Output 817 ml  Net 367.74 ml   Filed Weights   06/21/19 0439 06/22/19 0500 06/23/19 0500  Weight: 81.2 kg 79.1 kg 78.5 kg   General: 69 year old male who is obviously stunned HEENT: No JVD or lymphadenopathy is appreciated Neuro: Somewhat stunned, does answer questions.  Very weakened state CV: Heart sounds are distant PULM: Diminished throughout GI: Abdomen is soft complains of pain in the left external left upper quadrant area Extremities: warm/dry, 2+ edema obvious muscle wasting Skin: no rashes or lesions    Resolved Hospital Problem list     Assessment & Plan:  Citrobacter septic shock thought originating from bladder  Continue ceftriaxone for total of 14 days Wean pressors as tolerated Giving albumin via CRRT for hypovolemic state    ESRD- Appreciate renal's input  Metabolic encephalopathy Monitor as able Reorientate as able  Severe thrombocytopenia- present on admission, stable, mild DIC + sepsis, no indication to transfuse at present, monitor for now Continue to monitor No need for transfusion at this time  ?aflutter overnight- Continue to monitor appears to be in sinus rhythm at this time  Possible renal cell cancer-  We will need outpatient follow-up  Protein calorie malnutrition, dysphagia-  Dysphagia diet  Celiac and superior mesenteric artery aneurysms-On CT scanning 05/2019 aneurysmal dilatation of bilateral common iliac arteries again noted. Left measures 3.5 cm in diameter, R 3.3 cm.  Follows outpatient with vascular surgery  No interventions required at this time  Best practice:  Diet: Dysphagia 1 diet Pain/Anxiety/Delirium protocol (if indicated):  N/A VAP protocol (if indicated): N/A DVT prophylaxis: SCDs GI prophylaxis: N/A Glucose control: N/A Mobility: Bedrest Code Status: Full code Family Communication: 06/24/2019 patient updated at bedside Disposition: ICU  CRRT and pressor liberation   App cct 34 min   Richardson Landry Tymir Terral ACNP Acute Care Nurse Practitioner [place] Please consult Amion 06/24/2019, 11:06 AM

## 2019-06-25 ENCOUNTER — Inpatient Hospital Stay (HOSPITAL_COMMUNITY): Payer: Medicare Other

## 2019-06-25 LAB — RENAL FUNCTION PANEL
Albumin: 2.7 g/dL — ABNORMAL LOW (ref 3.5–5.0)
Albumin: 3 g/dL — ABNORMAL LOW (ref 3.5–5.0)
Anion gap: 9 (ref 5–15)
Anion gap: 12 (ref 5–15)
BUN: 13 mg/dL (ref 8–23)
BUN: 13 mg/dL (ref 8–23)
CO2: 25 mmol/L (ref 22–32)
CO2: 30 mmol/L (ref 22–32)
Calcium: 7.1 mg/dL — ABNORMAL LOW (ref 8.9–10.3)
Calcium: 7.7 mg/dL — ABNORMAL LOW (ref 8.9–10.3)
Chloride: 103 mmol/L (ref 98–111)
Chloride: 99 mmol/L (ref 98–111)
Creatinine, Ser: 1.51 mg/dL — ABNORMAL HIGH (ref 0.61–1.24)
Creatinine, Ser: 1.5 mg/dL — ABNORMAL HIGH (ref 0.61–1.24)
GFR calc Af Amer: 53 mL/min — ABNORMAL LOW (ref >60)
GFR calc Af Amer: 54 mL/min — ABNORMAL LOW (ref >60)
GFR calc non Af Amer: 46 mL/min — ABNORMAL LOW (ref >60)
GFR calc non Af Amer: 46 mL/min — ABNORMAL LOW (ref >60)
Glucose, Bld: 120 mg/dL — ABNORMAL HIGH (ref 70–99)
Glucose, Bld: 142 mg/dL — ABNORMAL HIGH (ref 70–99)
Phosphorus: 2.1 mg/dL — ABNORMAL LOW (ref 2.5–4.6)
Phosphorus: 2.1 mg/dL — ABNORMAL LOW (ref 2.5–4.6)
Potassium: 4 mmol/L (ref 3.5–5.1)
Potassium: 3.9 mmol/L (ref 3.5–5.1)
Sodium: 137 mmol/L (ref 135–145)
Sodium: 141 mmol/L (ref 135–145)

## 2019-06-25 LAB — MAGNESIUM: Magnesium: 2.3 mg/dL (ref 1.7–2.4)

## 2019-06-25 LAB — GLUCOSE, CAPILLARY
Glucose-Capillary: 117 mg/dL — ABNORMAL HIGH (ref 70–99)
Glucose-Capillary: 119 mg/dL — ABNORMAL HIGH (ref 70–99)
Glucose-Capillary: 120 mg/dL — ABNORMAL HIGH (ref 70–99)
Glucose-Capillary: 124 mg/dL — ABNORMAL HIGH (ref 70–99)
Glucose-Capillary: 131 mg/dL — ABNORMAL HIGH (ref 70–99)
Glucose-Capillary: 138 mg/dL — ABNORMAL HIGH (ref 70–99)
Glucose-Capillary: 184 mg/dL — ABNORMAL HIGH (ref 70–99)
Glucose-Capillary: 64 mg/dL — ABNORMAL LOW (ref 70–99)

## 2019-06-25 LAB — CBC
HCT: 26.2 % — ABNORMAL LOW (ref 39.0–52.0)
Hemoglobin: 8.8 g/dL — ABNORMAL LOW (ref 13.0–17.0)
MCH: 29.5 pg (ref 26.0–34.0)
MCHC: 33.6 g/dL (ref 30.0–36.0)
MCV: 87.9 fL (ref 80.0–100.0)
Platelets: 16 10*3/uL — CL (ref 150–400)
RBC: 2.98 MIL/uL — ABNORMAL LOW (ref 4.22–5.81)
RDW: 15.9 % — ABNORMAL HIGH (ref 11.5–15.5)
WBC: 16.4 10*3/uL — ABNORMAL HIGH (ref 4.0–10.5)
nRBC: 0 % (ref 0.0–0.2)

## 2019-06-25 MED ORDER — DEXTROSE 50 % IV SOLN: 12.5000 g | INTRAVENOUS | Status: AC

## 2019-06-25 MED ORDER — DEXTROSE 50 % IV SOLN
INTRAVENOUS | Status: AC
  Administered 2019-06-25: 12.5 g via INTRAVENOUS
  Filled 2019-06-25: qty 50

## 2019-06-25 NOTE — Progress Notes (Signed)
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  Sepsis suspected, patient had in-out urinary cath which produced stool. On norepi, phenylephrine, and vasopressin.  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  His wife reports that he is normally alert and oriented, but seemed to be "spaced out" and was having a hard time answering questions.  He had no URI symptoms, fever, diarrhea, known ill contacts or obvious focal neuro deficits.  He missed dialysis on 2/13 and when he was not improving his wife called EMS.  Work-up in the ED revealed leukocytosis of 25k, thrombocytopenia, lactic acid of 3.2, bilirubin 2.4 with normal LFTs.  He received 3.5 L IV fluids and empiric cefepime, vancomycin, Flagyl after which his blood pressure remained borderline and mental status only slightly improved.   Patient will answer questions and mentions left-sided back pain, denies abdominal pain. CT head was ordered and PCCM consulted for admission.  Covid-19 negative.  Past Medical History   has a past medical history of Anemia in chronic kidney disease, Arthritis, CHF (congestive heart failure) ([place]), Chronic back pain, End stage renal disease ([place]), GERD (gastroesophageal reflux disease), Gout, Heart failure  with reduced ejection fraction ([place]), and Hypertension. celiac and superior mesenteric artery aneurysms   Significant Hospital Events   2/14 Admit to [facility] 2/14 He developed SVT, was hemodynamically unstable, treated initially with adenosine but ultimately required DCCV around 715. Has remained hypotensive, received albumin and now another 2 L IV fluid resuscitation (5 L total). Interacting but confused, still not back to baseline per wife at bedside 2/15 Stool-like produced from in-out urinary cath 2/16 Urology consult, confirmed pyocystitis and drained bladder   Consults:  nephrology  Procedures:  2/14 left IJ HD cath insertion 2/16 Bedside bladder aspiration  Significant Diagnostic Tests:  2/14 CXR>>Cardiomegaly.  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  Chronic elevation of the left hemidiaphragm with chronic volume loss at the left lung base.  2/14 CT Abd 1. No acute pulmonary embolism. 2. Small bilateral pleural effusions with adjacent atelectasis. 3. Dilated main pulmonary artery, which may be secondary to pulmonary arterial hypertension. 4. Interval development of wedge-shaped defects involving the spleen, which may represent splenic infarcts. 5. Cholelithiasis with mild gallbladder wall thickening and pericholecystic free fluid, which may be secondary to underlying liver disease. If there is clinical concern for acute cholecystitis, follow-up with ultrasound is recommended. 6. 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 7. 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 8. Additional chronic findings as detailed above. 9. Aortic Atherosclerosis (ICD10-I70.0).  2/16 CT Pelvis with Rectal contrast 1. Negative for colovesicular fistula. Rectal contrast only opacifies the colon. 2. Bladder wall thickening likely from chronic outlet obstruction. The prostate is  enlarged. 3. Prominent anasarca.   CT Head  2/14 No acute intracranial finding. Atrophy. Chronic small-vessel ischemic changes. 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  Echo 2/14 . Left ventricular ejection fraction, by estimation, is 60 to 65%. The  left ventricle has normal function. The left ventricle has no regional  wall motion abnormalities. Left ventricular diastolic parameters are  indeterminate.   2. Right ventricular systolic function is normal. The right ventricular  size is normal.   3. Left atrial size was severely dilated.   4. Right atrial size was mildly dilated.   5. The mitral valve is degenerative. Mild mitral valve regurgitation. No  evidence of mitral stenosis.   6. The aortic valve is tricuspid. Aortic valve regurgitation is mild to  moderate. Mild aortic valve stenosis.   7. The inferior vena cava is normal in size with greater than 50%  respiratory variability, suggesting right atrial pressure of 3 mmHg.   RUQ U/S 2/15 1. Both the gallbladder and the CBD appear mildly distended with sludge but no stone identified. No intrahepatic biliary ductal dilatation to strongly suggest acute bile duct obstruction. Some gallbladder wall thickening, although no sonographic Murphy sign elicited to strongly suggest acalculous cholecystitis. 2. If abdominal pain continues then a nuclear medicine hepatobiliary scan would be valuable to exclude acute CBD or cystic duct obstruction.    Micro Data:  2/14 respiratory viral panel>> negative 2/14 blood cultures x2>> Citrobacter, sensitivities below 2/15 UCx Citrobacter pansensitive 2/15 UCx G(-) rods  Antimicrobials:  Flagyl 2/14 only Cefepime 2/14-d/c 2/15 Vancomycin 2/14-d/c2/15 Meropenem 2/15 -> d/c 2/16 Ceftx 2/16 -->  Interim history/subjective:  Off pressors Continues on CRRT, increasing UF   Poor PO intake.   Objective   Blood pressure (!) 115/57, pulse (!) 115, temperature (!) 97.3 F (36.3 C), temperature source Oral, resp. rate 13, height 6\' 4"  (1.93 m), weight 79.9 kg, SpO2 100 %.        Intake/Output Summary (Last 24 hours) at 06/25/2019 0934 Last data filed at 06/25/2019 0900 Gross per 24 hour  Intake 1176.04 ml  Output 1210 ml  Net -33.96 ml   Filed Weights   06/22/19 0500 06/23/19 0500 06/25/19 0500  Weight: 79.1 kg 78.5 kg 79.9 kg   General: Chronically ill, frail appearing older adult M, reclined in bed NAD  HEENT: NCAT pink mmm. Trachea midline. Anicteric sclera  Neuro: Drowsy and lethargic. Awakens to voice and is following commands.  CV: RRR distant heart sounds, s1s2 appreciated without rgm. Cap refill < 3 seconds  PULM: Symmetrical chest expansion, no accessory muscle use. No adventitious sounds bilaterally  GI: Soft thin ndnt, normoactive  Extremities: Symmetrical muscle wasting. No obvious joint deformity.  Skin: c/d/w without rash  Psych: flat affect, paucity of speech   Resolved Hospital Problem list   Septic Shock  Assessment & Plan:   Severe sepsis due to Citrobacter -Shock improved  thought originating from bladder  P Continue ceftriaxone for total of 14 days Off pressors  ESRD- Continues on CRRT. Nephrology is advancing UF today (2/2), with hope to transition back to baseline MWF iHD in future   Acute Metabolic encephalopathy Delirium precautions  Correcting underlying sepsis as above  Severe thrombocytopenia present on admission, stable,  Mild DIC in setting of septic shock P Continue to monitor No need for transfusion at this time No chemical VTE ppx   ?aflutter overnight, improved P Continue ICU monitoring  Possible renal  cell cancer-  Will need outpatient follow-up  Protein calorie malnutrition, dysphagia-  Dysphagia 1 diet, encourage PO intake   Celiac and superior mesenteric artery aneurysms -On CT scanning  05/2019 aneurysmal dilatation of bilateral common iliac arteries again noted. Left measures 3.5 cm in diameter, R 3.3 cm.  -Follows with vascular OP  P No interventions required at this time  Best practice:  Diet: Dysphagia 1 diet Pain/Anxiety/Delirium protocol (if indicated): N/A VAP protocol (if indicated): N/A DVT prophylaxis: SCDs GI prophylaxis: N/A Glucose control: N/A Mobility: Bedrest Code Status: Full code Family Communication: Patient updated 2/20 at bedside Disposition: ICU on CRRT   CRITICAL CARE Performed by: Cristal Generous  Total critical care time: 30 minutes  Critical care time was exclusive of separately billable procedures and treating other patients. Critical care was necessary to treat or prevent imminent or life-threatening deterioration.  Critical care was time spent personally by me on the following activities: development of treatment plan with patient and/or surrogate as well as nursing, discussions with consultants, evaluation of patient's response to treatment, examination of patient, obtaining history from patient or surrogate, ordering and performing treatments and interventions, ordering and review of laboratory studies, ordering and review of radiographic studies, pulse oximetry and re-evaluation of patient's condition.   [name] MSN, AGACNP-BC [place] [phone] If no answer, [phone] 06/25/2019, 9:34 AM

## 2019-06-25 NOTE — Progress Notes (Signed)
[facility] Progress Note   Assessment/ Plan:   Dialysis Orders:  [facility] M[facility] 3.75h 400/800 EDW 73kg 2K/3.5Ca UFP 4   AVF Hep 3300 No VDRA/ESA  OP Labs 06/10/19: Hgb 12.5 Ca 8.0 Phos 3.9 Alb 3.4  PTH 1743  Assessment/Plan: 1. Septic shock - Blood cultures + for Enterobacter species citrobacter, has likely splenic infarcts on CT abd/ pelvis and distended bladder.  Initially had distended bladder and thought to be stool on micro eval, CT with rectal contrast negative.  Urology consulted, dx'd with pyocystitis, s/p Foley drainage.  On antibiotics, pressors off.   2. AMS: CT head negative, has aneurysmal dilatation of bilateral cervical ICAs.  Slowly improving, think hypoactive delirium playing a role. 3. Thrombocytopenia: plts down to 13 and 15, has no schistocytes on smear, LDH 209, haptoglobin neg, not consistent with TTP/HUS, suspect sepsis related. 4. ESRD -  HD MWF. Last HD 2/10. Continue CRRT, no anticoagulant, will try to pull 52mL-100 mL/ hr today.  If goes well think we can take him down tomorrow and get him out of ICU if PCCM in agreement--> would like to improve volume status in case we are not able to pull as much as he normally can in IHD 5. Hypotension/volume  - BP better. On midodrine, stress dose steroids, off pressor today 6. Anemia  - no need for ESA now, will follow and add as appropriate--> on darbe weekly 2/19 7. Aflutter: improved, per primary 8. Metabolic bone disease -  Ca ok. Low Phos. Hold Velphoro binder for now. On Sensipar.  9. Dispo: ICU  Subjective:    Better.  Off pressors.  HR seems to be stabilized in the 110s.       Objective:   BP (!) 113/59   Pulse (!) 115   Temp (!) 97.3 F (36.3 C) (Oral)   Resp 14   Ht 6\' 4"  (1.93 m)   Wt 79.9 kg   SpO2 100%   BMI 21.44 kg/m   Physical Exam: Gen: sitting in bed, appears ill, more interactive CVS: tachycardic, no m/r/g Resp: normal WOB, clear anteriorly Abd: soft, NABS GU: Foley out Ext: 1+  dependent edema ACCESS: RUE AVF +T/B, aneurysmal, L IJ trialysis catheter NEURO: awake, a little more interactive  Labs: BMET Recent Labs  Lab 06/22/19 0319 06/22/19 1550 06/23/19 0306 06/23/19 1600 06/24/19 0345 06/24/19 1732 06/25/19 0550  NA 137 138 137 135 139 136 137  K 4.6 4.5 4.2 4.6 4.2 3.7 4.0  CL 103 106 103 103 104 102 103  CO2 21* 21* 22 23 23 25 25   GLUCOSE 121* 123* 107* 112* 108* 145* 120*  BUN 36* 27* 22 18 18 15 13   CREATININE 3.15* 2.49* 1.87* 1.82* 1.65* 1.36* 1.51*  CALCIUM 7.3* 7.4* 7.2* 7.2* 7.4* 7.4* 7.1*  PHOS 2.6 2.1* 2.0* 2.9 2.2* 2.4* 2.1*   CBC Recent Labs  Lab 06/19/19 0000 06/19/19 0556 06/22/19 0319 06/23/19 0306 06/24/19 0345 06/25/19 0457  WBC 25.7*   < > 16.5* 15.1* 14.5* 16.4*  NEUTROABS 23.4*  --  14.9*  --   --   --   HGB 13.1   < > 10.7* 10.3* 8.8* 8.8*  HCT 38.3*   < > 31.1* 30.5* 26.3* 26.2*  MCV 87.2   < > 85.2 85.2 88.0 87.9  PLT 17*   < > 15* 15* 18* 16*   < > = values in this interval not displayed.      Medications:    .  Chlorhexidine Gluconate Cloth  6 each Topical Daily  . cinacalcet  90 mg Oral QPC supper  . darbepoetin (ARANESP) injection - NON-DIALYSIS  60 mcg Subcutaneous Q Fri-1800  . feeding supplement (NEPRO CARB STEADY)  237 mL Oral TID BM  . hydrocortisone sod succinate (SOLU-CORTEF) inj  50 mg Intravenous Q6H  . insulin aspart  5 Units Intravenous Once  . midodrine  10 mg Oral TID WC  . tamsulosin  0.4 mg Oral Daily  . Thrombi-Pad  1 each Topical Once     Madelon Lips MD 06/25/2019, 7:46 AM

## 2019-06-26 ENCOUNTER — Encounter (HOSPITAL_COMMUNITY): Payer: Self-pay | Admitting: Pulmonary Disease

## 2019-06-26 LAB — RENAL FUNCTION PANEL
Albumin: 2.8 g/dL — ABNORMAL LOW (ref 3.5–5.0)
Albumin: 2.5 g/dL — ABNORMAL LOW (ref 3.5–5.0)
Anion gap: 8 (ref 5–15)
Anion gap: 18 — ABNORMAL HIGH (ref 5–15)
BUN: 16 mg/dL (ref 8–23)
BUN: 17 mg/dL (ref 8–23)
CO2: 27 mmol/L (ref 22–32)
CO2: 25 mmol/L (ref 22–32)
Calcium: 7.6 mg/dL — ABNORMAL LOW (ref 8.9–10.3)
Calcium: 6.8 mg/dL — ABNORMAL LOW (ref 8.9–10.3)
Chloride: 104 mmol/L (ref 98–111)
Chloride: 99 mmol/L (ref 98–111)
Creatinine, Ser: 1.42 mg/dL — ABNORMAL HIGH (ref 0.61–1.24)
Creatinine, Ser: 1.38 mg/dL — ABNORMAL HIGH (ref 0.61–1.24)
GFR calc Af Amer: 58 mL/min — ABNORMAL LOW (ref >60)
GFR calc Af Amer: 60 mL/min — ABNORMAL LOW (ref >60)
GFR calc non Af Amer: 50 mL/min — ABNORMAL LOW (ref >60)
GFR calc non Af Amer: 51 mL/min — ABNORMAL LOW (ref >60)
Glucose, Bld: 127 mg/dL — ABNORMAL HIGH (ref 70–99)
Glucose, Bld: 138 mg/dL — ABNORMAL HIGH (ref 70–99)
Phosphorus: 1.8 mg/dL — ABNORMAL LOW (ref 2.5–4.6)
Phosphorus: 3.5 mg/dL (ref 2.5–4.6)
Potassium: 4.1 mmol/L (ref 3.5–5.1)
Potassium: 3.8 mmol/L (ref 3.5–5.1)
Sodium: 139 mmol/L (ref 135–145)
Sodium: 142 mmol/L (ref 135–145)

## 2019-06-26 LAB — CBC
HCT: 31 % — ABNORMAL LOW (ref 39.0–52.0)
Hemoglobin: 10.5 g/dL — ABNORMAL LOW (ref 13.0–17.0)
MCH: 30.1 pg (ref 26.0–34.0)
MCHC: 33.9 g/dL (ref 30.0–36.0)
MCV: 88.8 fL (ref 80.0–100.0)
Platelets: 13 10*3/uL — CL (ref 150–400)
RBC: 3.49 MIL/uL — ABNORMAL LOW (ref 4.22–5.81)
RDW: 16.7 % — ABNORMAL HIGH (ref 11.5–15.5)
WBC: 19.9 10*3/uL — ABNORMAL HIGH (ref 4.0–10.5)
nRBC: 0 % (ref 0.0–0.2)

## 2019-06-26 LAB — GLUCOSE, CAPILLARY
Glucose-Capillary: 101 mg/dL — ABNORMAL HIGH (ref 70–99)
Glucose-Capillary: 106 mg/dL — ABNORMAL HIGH (ref 70–99)
Glucose-Capillary: 119 mg/dL — ABNORMAL HIGH (ref 70–99)
Glucose-Capillary: 122 mg/dL — ABNORMAL HIGH (ref 70–99)
Glucose-Capillary: 122 mg/dL — ABNORMAL HIGH (ref 70–99)
Glucose-Capillary: 59 mg/dL — ABNORMAL LOW (ref 70–99)

## 2019-06-26 LAB — MAGNESIUM
Magnesium: 2.3 mg/dL (ref 1.7–2.4)
Magnesium: 2.3 mg/dL (ref 1.7–2.4)

## 2019-06-26 MED ORDER — ONDANSETRON HCL 4 MG/2ML IJ SOLN
4.0000 mg | Freq: Four times a day (QID) | INTRAMUSCULAR | Status: DC | PRN
  Administered 2019-06-26: 18:00:00 4 mg via INTRAVENOUS

## 2019-06-26 MED ORDER — SODIUM PHOSPHATES 45 MMOLE/15ML IV SOLN
20.0000 mmol | Freq: Once | INTRAVENOUS | Status: AC
  Administered 2019-06-26: 09:00:00 20 mmol via INTRAVENOUS
  Filled 2019-06-26: qty 6.67

## 2019-06-26 MED ORDER — AMIODARONE LOAD VIA INFUSION
150.0000 mg | Freq: Once | INTRAVENOUS | Status: DC
  Filled 2019-06-26: qty 83.34

## 2019-06-26 MED ORDER — DEXTROSE 50 % IV SOLN
INTRAVENOUS | Status: AC
  Administered 2019-06-27: 25 mL
  Filled 2019-06-26: qty 50

## 2019-06-26 MED ORDER — AMIODARONE HCL IN DEXTROSE 360-4.14 MG/200ML-% IV SOLN: 30.0000 mg/h | INTRAVENOUS | Status: DC

## 2019-06-26 MED ORDER — ONDANSETRON HCL 4 MG/2ML IJ SOLN
INTRAMUSCULAR | Status: AC
  Filled 2019-06-26: qty 2

## 2019-06-26 MED ORDER — CALCIUM GLUCONATE-NACL 1-0.675 GM/50ML-% IV SOLN
1.0000 g | Freq: Once | INTRAVENOUS | Status: AC
  Administered 2019-06-26: 1000 mg via INTRAVENOUS
  Filled 2019-06-26: qty 50

## 2019-06-26 MED ORDER — AMIODARONE HCL IN DEXTROSE 360-4.14 MG/200ML-% IV SOLN: 60.0000 mg/h | INTRAVENOUS | Status: DC

## 2019-06-26 MED ORDER — HYDROCORTISONE NA SUCCINATE PF 100 MG IJ SOLR
50.0000 mg | Freq: Two times a day (BID) | INTRAMUSCULAR | Status: AC
  Administered 2019-06-26 – 2019-06-27 (×3): 50 mg via INTRAVENOUS
  Filled 2019-06-26 (×3): qty 2

## 2019-06-26 MED ORDER — LACTATED RINGERS IV BOLUS
500.0000 mL | Freq: Once | INTRAVENOUS | Status: AC
  Administered 2019-06-26: 500 mL via INTRAVENOUS

## 2019-06-26 NOTE — Progress Notes (Signed)
[facility] Progress Note Patient Name: KEIJUAN SCHELLHASE DOB: 12-Sep-1950 MRN: [id number]   Date of Service  06/26/2019  HPI/Events of Note  Called to evaluate patient for Afib with RVR. Patient's ventricular response rate varies between 105-125 for the most part. Apparently it was slightly higher than that prior to my evaluation (closer to 130s sustained).  He is on CRRT with UF @ 100cc/hr.  He is maintaining MAP 65 on his own throughout this.   eICU Interventions  No change for now. Continue UF @ 100/hr.  If he has Afib with RVR with response rates in the high 130s or 140s sustained, we can consider amiodarone or similar.     Intervention Category Intermediate Interventions: Arrhythmia - evaluation and management  Marily Lente Royalty Domagala 06/26/2019, 12:10 AM

## 2019-06-26 NOTE — Progress Notes (Signed)
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  Sepsis suspected, patient had in-out urinary cath which produced stool. On norepi, phenylephrine, and vasopressin.  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  His wife reports that he is normally alert and oriented, but seemed to be "spaced out" and was having a hard time answering questions.  He had no URI symptoms, fever, diarrhea, known ill contacts or obvious focal neuro deficits.  He missed dialysis on 2/13 and when he was not improving his wife called EMS.  Work-up in the ED revealed leukocytosis of 25k, thrombocytopenia, lactic acid of 3.2, bilirubin 2.4 with normal LFTs.  He received 3.5 L IV fluids and empiric cefepime, vancomycin, Flagyl after which his blood pressure remained borderline and mental status only slightly improved.   Patient will answer questions and mentions left-sided back pain, denies abdominal pain. CT head was ordered and PCCM consulted for admission.  Covid-19 negative.  Past Medical History   has a past medical history of Anemia in chronic kidney disease, Arthritis, CHF (congestive heart failure) ([place]), Chronic back pain, End stage renal disease ([place]), GERD (gastroesophageal reflux disease), Gout, Heart failure  with reduced ejection fraction ([place]), and Hypertension. celiac and superior mesenteric artery aneurysms   Significant Hospital Events   2/14 Admit to [facility] 2/14 He developed SVT, was hemodynamically unstable, treated initially with adenosine but ultimately required DCCV around 715. Has remained hypotensive, received albumin and now another 2 L IV fluid resuscitation (5 L total). Interacting but confused, still not back to baseline per wife at bedside 2/15 Stool-like produced from in-out urinary cath 2/16 Urology consult, confirmed pyocystitis and drained bladder   Consults:  nephrology  Procedures:  2/14 left IJ HD cath insertion 2/16 Bedside bladder aspiration  Significant Diagnostic Tests:  2/14 CXR>>Cardiomegaly.  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  Chronic elevation of the left hemidiaphragm with chronic volume loss at the left lung base.  2/14 CT Abd 1. No acute pulmonary embolism. 2. Small bilateral pleural effusions with adjacent atelectasis. 3. Dilated main pulmonary artery, which may be secondary to pulmonary arterial hypertension. 4. Interval development of wedge-shaped defects involving the spleen, which may represent splenic infarcts. 5. Cholelithiasis with mild gallbladder wall thickening and pericholecystic free fluid, which may be secondary to underlying liver disease. If there is clinical concern for acute cholecystitis, follow-up with ultrasound is recommended. 6. 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 7. 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 8. Additional chronic findings as detailed above. 9. Aortic Atherosclerosis (ICD10-I70.0).  2/16 CT Pelvis with Rectal contrast 1. Negative for colovesicular fistula. Rectal contrast only opacifies the colon. 2. Bladder wall thickening likely from chronic outlet obstruction. The prostate is  enlarged. 3. Prominent anasarca.   CT Head  2/14 No acute intracranial finding. Atrophy. Chronic small-vessel ischemic changes. 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  Echo 2/14 . Left ventricular ejection fraction, by estimation, is 60 to 65%. The  left ventricle has normal function. The left ventricle has no regional  wall motion abnormalities. Left ventricular diastolic parameters are  indeterminate.   2. Right ventricular systolic function is normal. The right ventricular  size is normal.   3. Left atrial size was severely dilated.   4. Right atrial size was mildly dilated.   5. The mitral valve is degenerative. Mild mitral valve regurgitation. No  evidence of mitral stenosis.   6. The aortic valve is tricuspid. Aortic valve regurgitation is mild to  moderate. Mild aortic valve stenosis.   7. The inferior vena cava is normal in size with greater than 50%  respiratory variability, suggesting right atrial pressure of 3 mmHg.   RUQ U/S 2/15 1. Both the gallbladder and the CBD appear mildly distended with sludge but no stone identified. No intrahepatic biliary ductal dilatation to strongly suggest acute bile duct obstruction. Some gallbladder wall thickening, although no sonographic Murphy sign elicited to strongly suggest acalculous cholecystitis. 2. If abdominal pain continues then a nuclear medicine hepatobiliary scan would be valuable to exclude acute CBD or cystic duct obstruction.    Micro Data:  2/14 respiratory viral panel>> negative 2/14 blood cultures x2>> Citrobacter, sensitivities below 2/15 UCx Citrobacter pansensitive 2/15 UCx G(-) rods  Antimicrobials:  Flagyl 2/14 only Cefepime 2/14-d/c 2/15 Vancomycin 2/14-d/c2/15 Meropenem 2/15 -> d/c 2/16 Ceftx 2/16 -->  Interim history/subjective:  Off pressors More grumpy Does not want to  eat Questioning nursing regarding all care  Objective   Blood pressure (!) 116/56, pulse (!) 135, temperature (!) 96.8 F (36 C), temperature source Axillary, resp. rate 13, height 6\' 4"  (1.93 m), weight 75.3 kg, SpO2 100 %.        Intake/Output Summary (Last 24 hours) at 06/26/2019 1031 Last data filed at 06/26/2019 1000 Gross per 24 hour  Intake 398.79 ml  Output 2563 ml  Net -2164.21 ml   Filed Weights   06/23/19 0500 06/25/19 0500 06/26/19 0500  Weight: 78.5 kg 79.9 kg 75.3 kg   GEN: cachetic man in NAD HEENT: temporal wasting, MMM, trachea midline CV: irregular, tachycardic, ext warm PULM: Clear, no accessory muscle use GI: Soft, +BS  EXT: Muscle wasting without edema NEURO: Moves all 4 ext to command, globally weak PSYCH: RASS 0, ornery SKIN: No rashes   Resolved Hospital Problem list   Septic Shock  Assessment & Plan:   Severe sepsis due to Citrobacter -Shock improved  thought originating from bladder  P Continue ceftriaxone for total of 14 days Off pressors  ESRD- Continues on CRRT. Plan to transition to iHD tomorrow  Acute Metabolic encephalopathy Resolved  Severe thrombocytopenia present on admission, stable but persistent P Continue to monitor No need for transfusion at this time No chemical VTE ppx  Will consider heme consult in AM  Afib/RVR P Likely related to fluid pull, watch for now, can do amiodarone if run into trouble  Possible renal cell cancer-  Will need outpatient follow-up  Protein calorie malnutrition, dysphagia-  Dysphagia 1 diet, encourage PO intake  Told him today if continues poor PO he will need feeding tube, wife will reinforce this when she comes in  Celiac and superior mesenteric artery aneurysms -On  CT scanning 05/2019 aneurysmal dilatation of bilateral common iliac arteries again noted. Left measures 3.5 cm in diameter, R 3.3 cm.  -Follows with vascular OP  P No interventions required at this time  Best  practice:  Diet: Dysphagia 1 diet Pain/Anxiety/Delirium protocol (if indicated): N/A VAP protocol (if indicated): N/A DVT prophylaxis: SCDs GI prophylaxis: N/A Glucose control: N/A Mobility: Bedrest Code Status: Full code Family Communication: Patient updated 2/21 at bedside, I also called wife regarding his stubbornness with PO intake Disposition: ICU on CRRT , potentially floor in AM   The patient is critically ill with multiple organ systems failure and requires high complexity decision making for assessment and support, frequent evaluation and titration of therapies, application of advanced monitoring technologies and extensive interpretation of multiple databases. Critical Care Time devoted to patient care services described in this note independent of APP/resident time (if applicable)  is 31 minutes.   Erskine Emery MD [facility] 06/26/2019 10:36 AM Personal pager: [phone] If unanswered, please page CCM On-call: [phone]

## 2019-06-26 NOTE — Progress Notes (Signed)
  Amiodarone Drug - Drug Interaction Consult Note  Recommendations: No specific interactions; will monitor.  Amiodarone is metabolized by the cytochrome P450 system and therefore has the potential to cause many drug interactions. Amiodarone has an average plasma half-life of 50 days (range 20 to 100 days).   There is potential for drug interactions to occur several weeks or months after stopping treatment and the onset of drug interactions may be slow after initiating amiodarone.   []  Statins: Increased risk of myopathy. Simvastatin- restrict dose to 20mg  daily. Other statins: counsel patients to report any muscle pain or weakness immediately.  []  Anticoagulants: Amiodarone can increase anticoagulant effect. Consider warfarin dose reduction. Patients should be monitored closely and the dose of anticoagulant altered accordingly, remembering that amiodarone levels take several weeks to stabilize.  []  Antiepileptics: Amiodarone can increase plasma concentration of phenytoin, the dose should be reduced. Note that small changes in phenytoin dose can result in large changes in levels. Monitor patient and counsel on signs of toxicity.  []  Beta blockers: increased risk of bradycardia, AV block and myocardial depression. Sotalol - avoid concomitant use.  []   Calcium channel blockers (diltiazem and verapamil): increased risk of bradycardia, AV block and myocardial depression.  []   Cyclosporine: Amiodarone increases levels of cyclosporine. Reduced dose of cyclosporine is recommended.  []  Digoxin dose should be halved when amiodarone is started.  []  Diuretics: increased risk of cardiotoxicity if hypokalemia occurs.  []  Oral hypoglycemic agents (glyburide, glipizide, glimepiride): increased risk of hypoglycemia. Patient's glucose levels should be monitored closely when initiating amiodarone therapy.   []  Drugs that prolong the QT interval:  Torsades de pointes risk may be increased with concurrent use  - avoid if possible.  Monitor QTc, also keep magnesium/potassium WNL if concurrent therapy can't be avoided. Marland Kitchen Antibiotics: e.g. fluoroquinolones, erythromycin. . Antiarrhythmics: e.g. quinidine, procainamide, disopyramide, sotalol. . Antipsychotics: e.g. phenothiazines, haloperidol.  . Lithium, tricyclic antidepressants, and methadone.  Thank You,  Wynona Neat, PharmD, BCPS 06/26/2019 11:57 PM

## 2019-06-26 NOTE — Progress Notes (Addendum)
eLink Physician-Brief Progress Note Patient Name: Gregory [name] DOB: [birth year] MRN: [id number]   Date of Service  06/26/2019  HPI/Events of Note  Notified of tachycardia in the 140s, BP 109/47. Previous episodes of SVT and atrial flutter. Patient seen sitting up watching TV. CRRT discontinued earlier today. Negative 2.6 liters the past 2 days. Corrected calcium 8  eICU Interventions   Ordered 500 cc LR bolus Ordered calcium gluconate 1 g  EKG to be done  Amiodarone to be given if confirmed afib/flutter     Intervention Category Major Interventions: Arrhythmia - evaluation and management  Judd Lien 06/26/2019, 8:51 PM

## 2019-06-26 NOTE — Progress Notes (Signed)
CRRT stopped at 1700 and blood was returned.  Patient tolerated well.  Will continue to monitor patient.

## 2019-06-26 NOTE — Progress Notes (Signed)
[facility] Progress Note   Assessment/ Plan:   Dialysis Orders:  [facility] 3.75h 400/800 EDW 73kg 2K/3.5Ca UFP 4   AVF Hep 3300 No VDRA/ESA  OP Labs 06/10/19: Hgb 12.5 Ca 8.0 Phos 3.9 Alb 3.4  PTH 1743  Assessment/Plan: 1. Septic shock - Blood cultures + for Enterobacter species citrobacter, has likely splenic infarcts on CT abd/ pelvis and distended bladder.  Initially had distended bladder and thought to be stool on micro eval, CT with rectal contrast negative.  Urology consulted, dx'd with pyocystitis, s/p Foley drainage.  On antibiotics, pressors off.   2. AMS: CT head negative, has aneurysmal dilatation of bilateral cervical ICAs.  Slowly improving, think hypoactive delirium playing a role.  Improving. 3. Thrombocytopenia: plts down to 13 and 15, has no schistocytes on smear, LDH 209, haptoglobin neg, not consistent with TTP/HUS, suspect sepsis related. 4. ESRD -  HD MWF. Last HD 2/10. Continue CRRT, no anticoagulant. Decrease uf rate to net neg 50 mL/ hr today in effort to aid in HR control.  Off pressors, will take down CRRT this evening, no restart if filter clots.   5. Hypotension/volume  - BP better. On midodrine, stress dose steroids, off pressor today 6. Anemia  - no need for ESA now, will follow and add as appropriate--> on darbe weekly 2/19 7. Aflutter: improved, per primary, hopefully for better control today so we can pull fluid as we transition to IHD. 8. Metabolic bone disease -  Ca ok. Low Phos. Hold Velphoro binder for now. On Sensipar.  9. Dispo: ICU  Subjective:    Still off pressors.  In Afib with RVR in the 130s.  Net neg 2.2L overnight.       Objective:   BP 118/60   Pulse (!) 136   Temp (!) 96.8 F (36 C) (Axillary)   Resp 16   Ht 6\' 4"  (1.93 m)   Wt 75.3 kg   SpO2 100%   BMI 20.21 kg/m   Physical Exam: Gen: sitting in bed, appears ill, more interactive CVS: tachycardic, no m/r/g Resp: normal WOB, clear anteriorly Abd: soft, NABS GU:  Foley out Ext: 1+ dependent edema, improving ACCESS: RUE AVF +T/B, aneurysmal, L IJ trialysis catheter NEURO: awake, a little more interactive MSK: sarcopenic  Labs: BMET Recent Labs  Lab 06/23/19 0306 06/23/19 1600 06/24/19 0345 06/24/19 1732 06/25/19 0550 06/25/19 1538 06/26/19 0506  NA 137 135 139 136 137 141 139  K 4.2 4.6 4.2 3.7 4.0 3.9 4.1  CL 103 103 104 102 103 99 104  CO2 22 23 23 25 25 30 27   GLUCOSE 107* 112* 108* 145* 120* 142* 127*  BUN 22 18 18 15 13 13 16   CREATININE 1.87* 1.82* 1.65* 1.36* 1.51* 1.50* 1.42*  CALCIUM 7.2* 7.2* 7.4* 7.4* 7.1* 7.7* 7.6*  PHOS 2.0* 2.9 2.2* 2.4* 2.1* 2.1* 1.8*   CBC Recent Labs  Lab 06/22/19 0319 06/22/19 0319 06/23/19 0306 06/24/19 0345 06/25/19 0457 06/26/19 0506  WBC 16.5*   < > 15.1* 14.5* 16.4* 19.9*  NEUTROABS 14.9*  --   --   --   --   --   HGB 10.7*   < > 10.3* 8.8* 8.8* 10.5*  HCT 31.1*   < > 30.5* 26.3* 26.2* 31.0*  MCV 85.2   < > 85.2 88.0 87.9 88.8  PLT 15*   < > 15* 18* 16* 13*   < > = values in this interval not displayed.  Medications:    . Chlorhexidine Gluconate Cloth  6 each Topical Daily  . cinacalcet  90 mg Oral QPC supper  . darbepoetin (ARANESP) injection - NON-DIALYSIS  60 mcg Subcutaneous Q Fri-1800  . feeding supplement (NEPRO CARB STEADY)  237 mL Oral TID BM  . hydrocortisone sod succinate (SOLU-CORTEF) inj  50 mg Intravenous Q6H  . midodrine  10 mg Oral TID WC  . tamsulosin  0.4 mg Oral Daily     Madelon Lips MD 06/26/2019, 9:38 AM

## 2019-06-26 NOTE — Progress Notes (Signed)
Hypoglycemic Event  CBG: 59  Treatment: D50 25 mL (12.5 gm)  Symptoms: None  Follow-up CBG: Time: 0031 CBG Result: 87  Possible Reasons for Event: Inadequate meal intake and Unknown      Mardi Mainland

## 2019-06-27 ENCOUNTER — Encounter (HOSPITAL_COMMUNITY): Payer: Self-pay | Admitting: Pulmonary Disease

## 2019-06-27 DIAGNOSIS — I480 Paroxysmal atrial fibrillation: Secondary | ICD-10-CM

## 2019-06-27 DIAGNOSIS — E46 Unspecified protein-calorie malnutrition: Secondary | ICD-10-CM

## 2019-06-27 DIAGNOSIS — Z79899 Other long term (current) drug therapy: Secondary | ICD-10-CM

## 2019-06-27 DIAGNOSIS — N2889 Other specified disorders of kidney and ureter: Secondary | ICD-10-CM

## 2019-06-27 DIAGNOSIS — D696 Thrombocytopenia, unspecified: Secondary | ICD-10-CM

## 2019-06-27 DIAGNOSIS — D649 Anemia, unspecified: Secondary | ICD-10-CM

## 2019-06-27 LAB — CBC
HCT: 28.4 % — ABNORMAL LOW (ref 39.0–52.0)
Hemoglobin: 9.4 g/dL — ABNORMAL LOW (ref 13.0–17.0)
MCH: 29.7 pg (ref 26.0–34.0)
MCHC: 33.1 g/dL (ref 30.0–36.0)
MCV: 89.9 fL (ref 80.0–100.0)
Platelets: 11 10*3/uL — CL (ref 150–400)
RBC: 3.16 MIL/uL — ABNORMAL LOW (ref 4.22–5.81)
RDW: 17.5 % — ABNORMAL HIGH (ref 11.5–15.5)
WBC: 19.8 10*3/uL — ABNORMAL HIGH (ref 4.0–10.5)
nRBC: 0.1 % (ref 0.0–0.2)

## 2019-06-27 LAB — GLUCOSE, CAPILLARY
Glucose-Capillary: 112 mg/dL — ABNORMAL HIGH (ref 70–99)
Glucose-Capillary: 164 mg/dL — ABNORMAL HIGH (ref 70–99)
Glucose-Capillary: 41 mg/dL — CL (ref 70–99)
Glucose-Capillary: 60 mg/dL — ABNORMAL LOW (ref 70–99)
Glucose-Capillary: 76 mg/dL (ref 70–99)
Glucose-Capillary: 87 mg/dL (ref 70–99)
Glucose-Capillary: 91 mg/dL (ref 70–99)
Glucose-Capillary: 92 mg/dL (ref 70–99)
Glucose-Capillary: 99 mg/dL (ref 70–99)

## 2019-06-27 LAB — RENAL FUNCTION PANEL
Albumin: 2.4 g/dL — ABNORMAL LOW (ref 3.5–5.0)
Anion gap: 10 (ref 5–15)
BUN: 28 mg/dL — ABNORMAL HIGH (ref 8–23)
CO2: 25 mmol/L (ref 22–32)
Calcium: 7.1 mg/dL — ABNORMAL LOW (ref 8.9–10.3)
Chloride: 103 mmol/L (ref 98–111)
Creatinine, Ser: 2.02 mg/dL — ABNORMAL HIGH (ref 0.61–1.24)
GFR calc Af Amer: 38 mL/min — ABNORMAL LOW (ref >60)
GFR calc non Af Amer: 32 mL/min — ABNORMAL LOW (ref >60)
Glucose, Bld: 115 mg/dL — ABNORMAL HIGH (ref 70–99)
Phosphorus: 2.6 mg/dL (ref 2.5–4.6)
Potassium: 4.1 mmol/L (ref 3.5–5.1)
Sodium: 138 mmol/L (ref 135–145)

## 2019-06-27 LAB — MAGNESIUM: Magnesium: 2.3 mg/dL (ref 1.7–2.4)

## 2019-06-27 LAB — VITAMIN B12: Vitamin B-12: 417 pg/mL (ref 180–914)

## 2019-06-27 LAB — RETICULOCYTES
Immature Retic Fract: 10.6 % (ref 2.3–15.9)
RBC.: 2.99 MIL/uL — ABNORMAL LOW (ref 4.22–5.81)
Retic Count, Absolute: 106.1 10*3/uL (ref 19.0–186.0)
Retic Ct Pct: 3.6 % — ABNORMAL HIGH (ref 0.4–3.1)

## 2019-06-27 LAB — PLATELET COUNT: Platelets: 9 10*3/uL — CL (ref 150–400)

## 2019-06-27 LAB — IMMATURE PLATELET FRACTION: Immature Platelet Fraction: 22.8 % — ABNORMAL HIGH (ref 1.2–8.6)

## 2019-06-27 LAB — PLATELET BY CITRATE

## 2019-06-27 LAB — CALCIUM, IONIZED: Calcium, Ionized, Serum: 4 mg/dL — ABNORMAL LOW (ref 4.5–5.6)

## 2019-06-27 MED ORDER — AMIODARONE LOAD VIA INFUSION
150.0000 mg | Freq: Once | INTRAVENOUS | Status: DC
  Filled 2019-06-27: qty 83.34

## 2019-06-27 MED ORDER — ALBUMIN HUMAN 25 % IV SOLN
25.0000 g | Freq: Four times a day (QID) | INTRAVENOUS | Status: AC
  Administered 2019-06-27 (×2): 25 g via INTRAVENOUS
  Filled 2019-06-27 (×2): qty 100

## 2019-06-27 MED ORDER — ORAL CARE MOUTH RINSE
15.0000 mL | Freq: Two times a day (BID) | OROMUCOSAL | Status: DC
  Administered 2019-06-27 – 2019-07-12 (×27): 15 mL via OROMUCOSAL

## 2019-06-27 MED ORDER — AMIODARONE HCL IN DEXTROSE 360-4.14 MG/200ML-% IV SOLN
60.0000 mg/h | INTRAVENOUS | Status: AC
  Administered 2019-06-27: 60 mg/h via INTRAVENOUS
  Filled 2019-06-27: qty 200

## 2019-06-27 MED ORDER — AMIODARONE HCL IN DEXTROSE 360-4.14 MG/200ML-% IV SOLN
30.0000 mg/h | INTRAVENOUS | Status: DC
  Administered 2019-06-27 – 2019-06-30 (×6): 30 mg/h via INTRAVENOUS
  Filled 2019-06-27 (×7): qty 200

## 2019-06-27 MED ORDER — LACTATED RINGERS IV BOLUS
500.0000 mL | Freq: Once | INTRAVENOUS | Status: AC
  Administered 2019-06-27: 01:00:00 500 mL via INTRAVENOUS

## 2019-06-27 MED ORDER — DIGOXIN 0.25 MG/ML IJ SOLN
0.1250 mg | Freq: Four times a day (QID) | INTRAMUSCULAR | Status: AC
  Administered 2019-06-27 (×2): 0.125 mg via INTRAVENOUS
  Filled 2019-06-27 (×2): qty 2

## 2019-06-27 NOTE — Progress Notes (Signed)
[facility] Progress Note   Assessment/ Plan:   Dialysis Orders:  [facility] M[facility] 3.75h 400/800 EDW 73kg 2K/3.5Ca UFP 4   AVF Hep 3300 No VDRA/ESA  OP Labs 06/10/19: Hgb 12.5 Ca 8.0 Phos 3.9 Alb 3.4  PTH 1743  Assessment/Plan: 1. Septic shock - urine and blood cultures + for citrobacter koseri, has likely splenic infarcts on CT abd/ pelvis and distended bladder.  Initially had distended bladder and thought to be stool on micro eval, CT with rectal contrast negative.  Urology consulted, dx'd with pyocystitis, s/p Foley drainage.  On antibiotics, pressors off.   2. AMS: CT head negative, has aneurysmal dilatation of bilateral cervical ICAs.  Slowly improving, think hypoactive delirium playing a role.  Improving. 3. Thrombocytopenia: plts down to 13 and 15, has no schistocytes on smear, LDH 209, haptoglobin neg, not consistent with TTP/HUS, suspect sepsis related. 4. ESRD -  HD MWF. Last HD 2/10. On CRRT 2/15 - 06/26/19. Off now. Plan next HD tomorrow or Wed, likely Wed.  5. Hypotension/volume  - BP better. On midodrine, stress dose steroids, off pressor today 6. Anemia  ckd -  on darbe weekly 2/19 7. Aflutter: improved, per primary 8. Metabolic bone disease -  Ca ok. Low Phos. Hold Velphoro binder for now. On Sensipar.   Kelly Splinter, MD 06/27/2019, 11:57 AM      Subjective:    Stable, HR still high off and on , afib/ flutter      Objective:   BP (!) 110/47   Pulse (!) 128   Temp 98 F (36.7 C) (Oral)   Resp 17   Ht 6\' 4"  (1.93 m)   Wt 75.2 kg   SpO2 100%   BMI 20.18 kg/m   Physical Exam: Gen: sitting in bed, appears ill, sluggish responses, awake and follows simple commands CVS: tachycardic, no m/r/g Resp: normal WOB, clear anteriorly Abd: soft, NABS GU: Foley out Ext: no sig edema ACCESS: RUE AVF +T/B, aneurysmal, L IJ trialysis catheter NEURO: awake, a little more interactive MSK: sarcopenic  Labs: BMET Recent Labs  Lab 06/24/19 0345 06/24/19 1732  06/25/19 0550 06/25/19 1538 06/26/19 0506 06/26/19 1542 06/27/19 0258  NA 139 136 137 141 139 142 138  K 4.2 3.7 4.0 3.9 4.1 3.8 4.1  CL 104 102 103 99 104 99 103  CO2 23 25 25 30 27 25 25   GLUCOSE 108* 145* 120* 142* 127* 138* 115*  BUN 18 15 13 13 16 17  28*  CREATININE 1.65* 1.36* 1.51* 1.50* 1.42* 1.38* 2.02*  CALCIUM 7.4* 7.4* 7.1* 7.7* 7.6* 6.8* 7.1*  PHOS 2.2* 2.4* 2.1* 2.1* 1.8* 3.5 2.6   CBC Recent Labs  Lab 06/22/19 0319 06/23/19 0306 06/24/19 0345 06/25/19 0457 06/26/19 0506 06/27/19 0259  WBC 16.5*   < > 14.5* 16.4* 19.9* 19.8*  NEUTROABS 14.9*  --   --   --   --   --   HGB 10.7*   < > 8.8* 8.8* 10.5* 9.4*  HCT 31.1*   < > 26.3* 26.2* 31.0* 28.4*  MCV 85.2   < > 88.0 87.9 88.8 89.9  PLT 15*   < > 18* 16* 13* 11*   < > = values in this interval not displayed.      Medications:    . amiodarone  150 mg Intravenous Once  . Chlorhexidine Gluconate Cloth  6 each Topical Daily  . darbepoetin (ARANESP) injection - NON-DIALYSIS  60 mcg Subcutaneous Q Fri-1800  . digoxin  0.125 mg Intravenous Q6H  . feeding supplement (NEPRO CARB STEADY)  237 mL Oral TID BM  . hydrocortisone sod succinate (SOLU-CORTEF) inj  50 mg Intravenous Q12H  . midodrine  10 mg Oral TID WC  . tamsulosin  0.4 mg Oral Daily

## 2019-06-27 NOTE — Consult Note (Addendum)
Gregory [name]  [phone] [phone]    [place]   Referring MD:  Dr. 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  The patient presented to the emergency room with a 2-day history of lethargy and confusion.  The patient's wife reported that he is normally alert and oriented seem more confused and was having difficulty answering questions.  Of note, the patient missed dialysis day prior to admission.  Work-up in the emergency room revealed leukocytosis with white blood cell count 25,000, normal hemoglobin of 13.1, and platelet count of 17,000.  His total bilirubin was 2.4 with normal LFTs.  He was hypotensive on admission.  He was admitted for sepsis and encephalopathy.  His most recent CBC available to me was from March 2019 which showed a platelet count of 111,000.  When seen today, the patient has difficulty providing the history to me.  He is not really sure what brought him into the hospital.  He is not complaining of any pain or shortness of breath.  His wife arrived during my visit with him and provided the history including the confusion at home which is what brought him in.  His appetite has been poor at home.  She thinks that he may have lost some weight.  He has not been complaining of anything specific such as headaches or dizziness.  He has not been complaining of any chest discomfort or shortness of breath.  She has not noticed any cough.  He is not having any abdominal pain, nausea, vomiting, constipation, diarrhea.  She has not noticed any bleeding.  The patient is married and has 2 children.  Hematology was asked to see the patient to make recommendations  regarding his thrombocytopenia.  Past Medical History:  Diagnosis Date  . Anemia in chronic kidney disease   . Arthritis    "hands; basically all my joints" (11/08/2014)  . CHF (congestive heart failure) ([place])   . Chronic back pain    "mostly lower" (11/08/2014)  . End stage renal disease ([place])    pt does not urinate; pt. states that he does dialysis on MWF; [place]" (11/08/2014)  . GERD (gastroesophageal reflux disease)    "sometimes" (11/08/2014)  . Gout    "hands" (11/08/2014)  . Heart failure with reduced ejection fraction ([place])   . Hypertension   :    Past Surgical History:  Procedure Laterality Date  . AV FISTULA PLACEMENT Right 07/26/2009  . EMBOLIZATION Left 11/08/2014   internal iliac artery; Gore Excluder bifurcated stent graft for repair of common iliac aneurysm  . ENDOVASCULAR STENT INSERTION Bilateral 11/08/2014   Procedure: REPAIR OF BILATERAL ILIAC ARTERY ANEURYSM WITH  Bifurcated stent, with coiling left internal artery;  Surgeon: Elam Dutch, MD;  Location: [facility];  Service: Vascular;  Laterality: Bilateral;  . ILIAC ARTERY ANEURYSM REPAIR Right 07/27/2017  . INSERTION OF DIALYSIS CATHETER N/A 02/22/2013   Procedure: INSERTION OF DIALYSIS CATHETER;  Surgeon: Elam Dutch, MD;  Location: [facility];  Service: Vascular;  Laterality: N/A;  . LIGATION OF ARTERIOVENOUS  FISTULA Right 82/42/3536   Procedure: PLICATION OF ARTERIOVENOUS  FISTULA;  Surgeon: Elam Dutch, MD;  Location: [facility];  Service: Vascular;  Laterality: Right;  . REPAIR ILIAC ARTERY Right 07/27/2017  Procedure: REPAIR ILIAC ARTERY ANEURYSM;  Surgeon: Elam Dutch, MD;  Location: [facility];  Service: Vascular;  Laterality: Right;  . REVISON OF ARTERIOVENOUS FISTULA Right 04/23/7587   Procedure: PLICATION OF RIGHT ARM  ARTERIOVENOUS FISTULA;  Surgeon: Elam Dutch, MD;  Location: [facility];  Service: Vascular;  Laterality: Right;  :   CURRENT MEDS: Current Facility-Administered Medications   Medication Dose Route Frequency Provider Last Rate Last Admin  . 0.9 %  sodium chloride infusion  250 mL Intravenous Continuous [name], MD   Stopped at 06/23/19 1406  . albumin human 25 % solution 25 g  25 g Intravenous Q6H Candee Furbish, MD 60 mL/hr at 06/27/19 1105 25 g at 06/27/19 1105  . amiodarone (NEXTERONE PREMIX) 360-4.14 MG/200ML-% (1.8 mg/mL) IV infusion  30 mg/hr Intravenous Continuous Eliezer Bottom T, MD 16.67 mL/hr at 06/27/19 1054 30 mg/hr at 06/27/19 1054  . amiodarone (NEXTERONE) 1.8 mg/mL load via infusion 150 mg  150 mg Intravenous Once Eliezer Bottom T, MD      . cefTRIAXone (ROCEPHIN) 2 g in sodium chloride 0.9 % 100 mL IVPB  2 g Intravenous Q24H Candee Furbish, MD 200 mL/hr at 06/27/19 1501 2 g at 06/27/19 1501  . Chlorhexidine Gluconate Cloth 2 % PADS 6 each  6 each Topical Daily Candee Furbish, MD   6 each at 06/27/19 1728  . Darbepoetin Alfa (ARANESP) injection 60 mcg  60 mcg Subcutaneous Q Fri-1800 Madelon Lips, MD   60 mcg at 06/24/19 1850  . feeding supplement (NEPRO CARB STEADY) liquid 237 mL  237 mL Oral TID BM Candee Furbish, MD   237 mL at 06/27/19 1411  . hydrocortisone sodium succinate (SOLU-CORTEF) 100 MG injection 50 mg  50 mg Intravenous Q12H Candee Furbish, MD   50 mg at 06/27/19 0915  . MEDLINE mouth rinse  15 mL Mouth Rinse BID Candee Furbish, MD   15 mL at 06/27/19 1501  . midodrine (PROAMATINE) tablet 10 mg  10 mg Oral TID WC [name], MD   10 mg at 06/27/19 1410  . ondansetron (ZOFRAN) injection 4 mg  4 mg Intravenous Q6H PRN Clydell Hakim, MD   4 mg at 06/26/19 1800  . tamsulosin (FLOMAX) capsule 0.4 mg  0.4 mg Oral Daily Alexis Frock, MD   0.4 mg at 06/27/19 3254      No Known Allergies:  Family History  Problem Relation Age of Onset  . Diabetes Father   . Hypertension Father   . Diabetes Sister   :  Social History   Socioeconomic History  . Marital status: Married    Spouse name: Not on file  . Number of  children: 2  . Years of education: Not on file  . Highest education level: Not on file  Occupational History  . Occupation: Retired  Tobacco Use  . Smoking status: Never Smoker  . Smokeless tobacco: Never Used  Substance and Sexual Activity  . Alcohol use: No  . Drug use: No  . Sexual activity: Not Currently  Other Topics Concern  . Not on file  Social History Narrative  . Not on file   Social Determinants of Health   Financial Resource Strain:   . Difficulty of Paying Living Expenses: Not on file  Food Insecurity:   . Worried About Charity fundraiser in the Last Year: Not on file  . Ran Out of Food in the Last Year: Not on file  Transportation Needs:   . Film/video editor (Medical): Not on file  . Lack of Transportation (Non-Medical): Not on file  Physical Activity:   . Days of Exercise per Week: Not on file  . Minutes of Exercise per Session: Not on file  Stress:   . Feeling of Stress : Not on file  Social Connections:   . Frequency of Communication with Friends and Family: Not on file  . Frequency of Social Gatherings with Friends and Family: Not on file  . Attends Religious Services: Not on file  . Active Member of Clubs or Organizations: Not on file  . Attends Archivist Meetings: Not on file  . Marital Status: Not on file  Intimate Partner Violence:   . Fear of Current or Ex-Partner: Not on file  . Emotionally Abused: Not on file  . Physically Abused: Not on file  . Sexually Abused: Not on file  :  REVIEW OF SYSTEMS: Review of systems was obtained from the patient's wife and noted in the HPI.  Exam: Patient Vitals for the past 24 hrs:  BP Temp Temp src Pulse Resp SpO2 Weight  06/27/19 1715 -- -- -- (!) 108 17 100 % --  06/27/19 1700 -- -- -- (!) 110 17 100 % --  06/27/19 1645 (!) 108/53 -- -- (!) 105 15 100 % --  06/27/19 1630 (!) 82/40 -- -- -- 15 -- --  06/27/19 1615 (!) 96/55 -- -- (!) 116 (!) 21 100 % --  06/27/19 1600 (!) 91/50 -- --  91 15 96 % --  06/27/19 1545 (!) 99/47 -- -- (!) 108 15 99 % --  06/27/19 1533 -- 98.3 F (36.8 C) Oral -- -- -- --  06/27/19 1530 (!) 103/49 -- -- (!) 120 16 100 % --  06/27/19 1515 (!) 107/56 -- -- (!) 107 15 100 % --  06/27/19 1500 (!) 104/54 -- -- (!) 110 16 100 % --  06/27/19 1445 (!) 104/54 -- -- (!) 108 16 100 % --  06/27/19 1430 (!) 107/54 -- -- (!) 118 19 100 % --  06/27/19 1415 (!) 105/58 -- -- (!) 118 17 100 % --  06/27/19 1400 101/60 -- -- (!) 104 16 100 % --  06/27/19 1345 (!) 104/57 -- -- (!) 111 15 100 % --  06/27/19 1330 (!) 113/57 -- -- (!) 119 19 100 % --  06/27/19 1315 (!) 110/57 -- -- (!) 119 15 100 % --  06/27/19 1300 (!) 111/54 -- -- (!) 120 18 93 % --  06/27/19 1245 (!) 100/54 -- -- (!) 111 10 99 % --  06/27/19 1230 (!) 119/58 -- -- (!) 124 17 100 % --  06/27/19 1215 115/69 -- -- (!) 127 14 100 % --  06/27/19 1200 133/75 -- -- (!) 132 18 100 % --  06/27/19 1145 (!) 118/54 -- -- (!) 124 18 100 % --  06/27/19 1130 (!) 113/55 -- -- (!) 125 19 100 % --  06/27/19 1115 (!) 114/55 -- -- (!) 128 17 100 % --  06/27/19 1100 (!) 108/53 98 F (36.7 C) Oral (!) 129 20 100 % --  06/27/19 1045 (!) 114/57 -- -- (!) 126 16 100 % --  06/27/19 1030 (!) 108/56 -- -- (!) 127 16 100 % --  06/27/19 1015 (!) 113/56 -- -- (!) 123 16 100 % --  06/27/19 1000 (!) 110/56 -- -- (!) 117 17 100 % --  06/27/19 0945 Marland Kitchen)  115/55 -- -- (!) 122 15 100 % --  06/27/19 0930 (!) 115/55 -- -- (!) 120 15 100 % --  06/27/19 0915 (!) 108/55 -- -- (!) 118 15 100 % --  06/27/19 0900 (!) 106/48 -- -- (!) 114 18 100 % --  06/27/19 0845 (!) 103/48 -- -- (!) 121 16 100 % --  06/27/19 0830 (!) 96/46 -- -- (!) 122 15 100 % --  06/27/19 0815 (!) 100/46 -- -- (!) 133 17 100 % --  06/27/19 0800 (!) 104/46 -- -- (!) 124 14 100 % --  06/27/19 0745 (!) 103/47 -- -- (!) 119 15 100 % --  06/27/19 0737 -- 98.5 F (36.9 C) Oral -- -- -- --  06/27/19 0730 (!) 101/47 -- -- (!) 125 14 100 % --  06/27/19 0715 (!) 100/48  -- -- (!) 127 17 100 % --  06/27/19 0700 (!) 110/47 -- -- (!) 128 17 100 % --  06/27/19 0645 (!) 106/51 -- -- (!) 126 12 100 % --  06/27/19 0630 (!) 100/56 -- -- (!) 131 17 100 % --  06/27/19 0615 (!) 100/50 -- -- (!) 121 15 100 % --  06/27/19 0600 (!) 98/50 -- -- (!) 122 14 100 % --  06/27/19 0545 (!) 79/48 -- -- (!) 125 19 98 % --  06/27/19 0530 (!) 85/48 -- -- (!) 125 17 100 % --  06/27/19 0515 (!) 93/47 -- -- (!) 134 14 100 % --  06/27/19 0500 (!) 81/36 -- -- (!) 144 14 100 % --  06/27/19 0445 (!) 86/39 -- -- (!) 144 14 100 % --  06/27/19 0437 -- -- -- -- -- -- 165 lb 12.6 oz (75.2 kg)  06/27/19 0430 (!) 92/42 -- -- (!) 142 15 100 % --  06/27/19 0415 (!) 82/53 -- -- (!) 129 16 100 % --  06/27/19 0405 -- 97.8 F (36.6 C) Oral -- -- -- --  06/27/19 0400 96/61 -- -- (!) 140 16 100 % --  06/27/19 0345 (!) 94/52 -- -- (!) 134 15 100 % --  06/27/19 0330 (!) 92/55 -- -- (!) 127 13 100 % --  06/27/19 0315 (!) 94/55 -- -- (!) 129 15 100 % --  06/27/19 0300 (!) 90/54 -- -- (!) 128 15 100 % --  06/27/19 0248 (!) 85/59 -- -- (!) 131 17 100 % --  06/27/19 0245 (!) 65/28 -- -- (!) 134 15 100 % --  06/27/19 0230 (!) 92/59 -- -- (!) 133 16 100 % --  06/27/19 0215 (!) 87/49 -- -- (!) 130 19 100 % --  06/27/19 0200 (!) 93/51 -- -- (!) 132 17 100 % --  06/27/19 0145 (!) 71/33 -- -- (!) 118 (!) 21 100 % --  06/27/19 0130 (!) 79/45 -- -- (!) 121 (!) 25 100 % --  06/27/19 0115 (!) 72/40 -- -- (!) 138 16 100 % --  06/27/19 0100 (!) 72/41 -- -- (!) 129 17 100 % --  06/27/19 0000 (!) 120/50 -- -- -- 15 100 % --  06/26/19 2356 -- 97.9 F (36.6 C) Oral -- -- -- --  06/26/19 2300 (!) 106/51 -- -- (!) 138 15 100 % --  06/26/19 2200 (!) 103/51 -- -- (!) 145 15 100 % --  06/26/19 2100 (!) 107/43 -- -- -- 17 -- --  06/26/19 2000 (!) 109/47 -- -- (!) 147 17 100 % --  06/26/19 1954 -- 97.6  F (36.4 C) Oral -- -- -- --  06/26/19 1900 (!) 110/44 -- -- -- 17 -- --  06/26/19 1800 (!) 105/45 -- -- (!) 142 17  100 % --    General: Chronically ill-appearing, cachectic, no distress Eyes:  no scleral icterus.   ENT:  There were no oropharyngeal lesions.   Lymphatics:  Negative cervical, supraclavicular or axillary adenopathy.  Respiratory: lungs were clear bilaterally without wheezing or crackles.  Cardiovascular:  Irregular, tachycardic There was no pedal edema. RUE fistula  GI:  abdomen was soft, flat, nontender, nondistended, without organomegaly.   Musculoskeletal:  no spinal tenderness of palpation of vertebral spine.  Skin exam was without ecchymosis, petechiae.   Neuro: Nonfocal, and accurately answers to questions according to his wife  LABS:  Lab Results  Component Value Date   WBC 19.8 (H) 06/27/2019   HGB 9.4 (L) 06/27/2019   HCT 28.4 (L) 06/27/2019   PLT 9 (LL) 06/27/2019   GLUCOSE 115 (H) 06/27/2019   CHOL 154 07/03/2016   TRIG 90 07/03/2016   HDL 60 07/03/2016   LDLCALC 76 07/03/2016   ALT 14 06/20/2019   AST 26 06/20/2019   NA 138 06/27/2019   K 4.1 06/27/2019   CL 103 06/27/2019   CREATININE 2.02 (H) 06/27/2019   BUN 28 (H) 06/27/2019   CO2 25 06/27/2019   PSA 1.5 04/07/2013   INR 1.5 (H) 06/20/2019    CT Head Wo Contrast  Result Date: 06/19/2019 CLINICAL DATA:  Altered mental status, unknown etiology. EXAM: CT HEAD WITHOUT CONTRAST TECHNIQUE: Contiguous axial images were obtained from the base of the skull through the vertex without intravenous contrast. COMPARISON:  None. FINDINGS: Brain: Generalized age related brain volume loss. Chronic small-vessel ischemic changes of the cerebral hemispheric white matter. No sign of acute large vessel territory infarction. No mass lesion, hemorrhage, hydrocephalus or extra-axial collection. 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 Skull: Normal Sinuses/Orbits: Mucosal thickening and a small  amount of fluid in the left maxillary sinus, probably secondary to dental incursion. Other: None IMPRESSION: No acute intracranial finding. Atrophy. Chronic small-vessel ischemic changes. 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 Electronically Signed   By: Nelson Chimes M.D.   On: 06/19/2019 20:43   CT ANGIO CHEST PE W OR WO CONTRAST  Result Date: 06/19/2019 CLINICAL DATA:  Acute abdominal pain. History of iliac artery repair. Dyspnea. EXAM: CT ANGIOGRAPHY CHEST CT ABDOMEN AND PELVIS WITH CONTRAST TECHNIQUE: Multidetector CT imaging of the chest was performed using the standard protocol during bolus administration of intravenous contrast. Multiplanar CT image reconstructions and MIPs were obtained to evaluate the vascular anatomy. Multidetector CT imaging of the abdomen and pelvis was performed using the standard protocol during bolus administration of intravenous contrast. CONTRAST:  145mL OMNIPAQUE IOHEXOL 350 MG/ML SOLN COMPARISON:  05/24/2019 FINDINGS: CTA CHEST FINDINGS Cardiovascular: Contrast injection is sufficient to demonstrate satisfactory opacification of the pulmonary arteries to the segmental level. There is no pulmonary embolus. The main pulmonary artery is dilated measuring approximately 4 cm in diameter. Heart size is enlarged. There is no significant pericardial effusion. Coronary artery calcifications are noted. There are atherosclerotic changes of the thoracic aorta without evidence for an aneurysm. There is a left IJ central venous catheter  with tip terminating in the SVC. Mediastinum/Nodes: --No mediastinal or hilar lymphadenopathy. --No axillary lymphadenopathy. --No supraclavicular lymphadenopathy. --Normal thyroid gland. --The esophagus is unremarkable Lungs/Pleura: There are small bilateral pleural effusions. There is adjacent atelectasis. There is no pneumothorax.  Musculoskeletal: No chest wall abnormality. No acute or significant osseous findings. Review of the MIP images confirms the above findings. CT ABDOMEN and PELVIS FINDINGS Hepatobiliary: Again noted are findings of cirrhosis. There is cholelithiasis with mild gallbladder wall thickening and pericholecystic free fluid.There is no biliary ductal dilation. Pancreas: Normal contours without ductal dilatation. No peripancreatic fluid collection. Spleen: There are new wedge-shaped defects involving the spleen measuring up to approximately 3 cm. Adrenals/Urinary Tract: --Adrenal glands: No adrenal hemorrhage. --Right kidney/ureter: The right kidney is atrophic without evidence for hydronephrosis. --Left kidney/ureter: Again identified is a hyperdense lesion arising from the posterior interpolar region of the left kidney measuring approximately 1.2 cm. There is no left-sided hydronephrosis. --Urinary bladder: The urinary bladder is significantly distended. Stomach/Bowel: --Stomach/Duodenum: No hiatal hernia or other gastric abnormality. Normal duodenal course and caliber. --Small bowel: No dilatation or inflammation. --Colon: No focal abnormality. --Appendix: Normal. 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 This is essentially stable since 07/02/2017. There is no clear stenosis at the origin of the celiac axis. The splenic artery appears to be grossly patent. 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 The graft is grossly patent. The aneurysm surrounding the left common iliac artery is stable measuring approximately 3.8 cm (axial series 5, image 49), previously measuring 3.8 cm on November 09, 2018. Embolization coils are noted in the left internal iliac artery. There is  a partially visualized right AV fistula that appears grossly patent. --No retroperitoneal lymphadenopathy. --No mesenteric lymphadenopathy. --No pelvic or inguinal lymphadenopathy. Reproductive: Prostate gland is mildly enlarged. Other: There is mild diffuse body wall edema. There is a trace amount of free fluid in the patient's abdomen and pelvis. Musculoskeletal. Again noted are findings of renal osteodystrophy. There is no displaced fracture. Review of the MIP images confirms the above findings. IMPRESSION: 1. No acute pulmonary embolism. 2. Small bilateral pleural effusions with adjacent atelectasis. 3. Dilated main pulmonary artery, which may be secondary to pulmonary arterial hypertension. 4. Interval development of wedge-shaped defects involving the spleen, which may represent splenic infarcts. 5. Cholelithiasis with mild gallbladder wall thickening and pericholecystic free fluid, which may be secondary to underlying liver disease. If there is clinical concern for acute cholecystitis, follow-up with ultrasound is recommended. 6. 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 7. 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 8. Additional chronic findings as detailed above. 9. Aortic Atherosclerosis (ICD10-I70.0). Electronically Signed   By: Constance Holster M.D.   On: 06/19/2019 21:02   CT PELVIS W CONTRAST  Result Date: 06/21/2019 CLINICAL DATA:  Vesicointestinal fistula.  Rectal contrast. EXAM: CT PELVIS WITH CONTRAST TECHNIQUE: Multidetector CT imaging of the pelvis was performed using the standard protocol following the bolus administration of intravenous contrast. CONTRAST:  152mL OMNIPAQUE IOHEXOL 300 MG/ML  SOLN COMPARISON:  Abdomen and pelvis CT from 2 days ago FINDINGS: Urinary Tract: No contrast is seen in the urinary bladder which is diffusely thick walled with reticulated appearance. Bowel: Rectal  contrast was administered.  There is mesorectal fat stranding which was also seen on prior. The underlying wall is not thickened and this may be from total-body volume overload. Vascular/Lymphatic: Aorto bi-iliac stenting with left hypogastric artery embolization. Where covered there is no visible aneurysmal sac enhancement. No acute vascular finding. Reproductive:  Enlarged prostate projecting into the bladder base. Other:  Prominent anasarca. Musculoskeletal: Spondylosis with bulky endplate spurring. Sacroiliac ankylosis with spurring. IMPRESSION: 1. Negative for colovesicular fistula. Rectal contrast only opacifies the colon. 2. Bladder wall thickening likely from chronic outlet obstruction. The prostate is enlarged. 3. Prominent anasarca. Electronically Signed   By: Monte Fantasia M.D.   On: 06/21/2019 10:46   CT ABDOMEN PELVIS W CONTRAST  Result Date: 06/19/2019 CLINICAL DATA:  Acute abdominal pain. History of iliac artery repair. Dyspnea. EXAM: CT ANGIOGRAPHY CHEST CT ABDOMEN AND PELVIS WITH CONTRAST TECHNIQUE: Multidetector CT imaging of the chest was performed using the standard protocol during bolus administration of intravenous contrast. Multiplanar CT image reconstructions and MIPs were obtained to evaluate the vascular anatomy. Multidetector CT imaging of the abdomen and pelvis was performed using the standard protocol during bolus administration of intravenous contrast. CONTRAST:  165mL OMNIPAQUE IOHEXOL 350 MG/ML SOLN COMPARISON:  05/24/2019 FINDINGS: CTA CHEST FINDINGS Cardiovascular: Contrast injection is sufficient to demonstrate satisfactory opacification of the pulmonary arteries to the segmental level. There is no pulmonary embolus. The main pulmonary artery is dilated measuring approximately 4 cm in diameter. Heart size is enlarged. There is no significant pericardial effusion. Coronary artery calcifications are noted. There are atherosclerotic changes of the thoracic aorta without evidence  for an aneurysm. There is a left IJ central venous catheter with tip terminating in the SVC. Mediastinum/Nodes: --No mediastinal or hilar lymphadenopathy. --No axillary lymphadenopathy. --No supraclavicular lymphadenopathy. --Normal thyroid gland. --The esophagus is unremarkable Lungs/Pleura: There are small bilateral pleural effusions. There is adjacent atelectasis. There is no pneumothorax. Musculoskeletal: No chest wall abnormality. No acute or significant osseous findings. Review of the MIP images confirms the above findings. CT ABDOMEN and PELVIS FINDINGS Hepatobiliary: Again noted are findings of cirrhosis. There is cholelithiasis with mild gallbladder wall thickening and pericholecystic free fluid.There is no biliary ductal dilation. Pancreas: Normal contours without ductal dilatation. No peripancreatic fluid collection. Spleen: There are new wedge-shaped defects involving the spleen measuring up to approximately 3 cm. Adrenals/Urinary Tract: --Adrenal glands: No adrenal hemorrhage. --Right kidney/ureter: The right kidney is atrophic without evidence for hydronephrosis. --Left kidney/ureter: Again identified is a hyperdense lesion arising from the posterior interpolar region of the left kidney measuring approximately 1.2 cm. There is no left-sided hydronephrosis. --Urinary bladder: The urinary bladder is significantly distended. Stomach/Bowel: --Stomach/Duodenum: No hiatal hernia or other gastric abnormality. Normal duodenal course and caliber. --Small bowel: No dilatation or inflammation. --Colon: No focal abnormality. --Appendix: Normal. 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 This is essentially stable since 07/02/2017. There is no clear stenosis at the origin of the celiac axis. The splenic artery appears to be grossly patent. There appears to be a partially thrombosed aneurysm involving the mid to distal  aspect of the splenic artery. 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 The graft is grossly patent. The aneurysm surrounding the left common iliac artery is stable measuring approximately 3.8 cm (axial series 5, image 49), previously measuring 3.8 cm on November 09, 2018. Embolization coils are noted in the left internal iliac artery. There is a partially  visualized right AV fistula that appears grossly patent. --No retroperitoneal lymphadenopathy. --No mesenteric lymphadenopathy. --No pelvic or inguinal lymphadenopathy. Reproductive: Prostate gland is mildly enlarged. Other: There is mild diffuse body wall edema. There is a trace amount of free fluid in the patient's abdomen and pelvis. Musculoskeletal. Again noted are findings of renal osteodystrophy. There is no displaced fracture. Review of the MIP images confirms the above findings. IMPRESSION: 1. No acute pulmonary embolism. 2. Small bilateral pleural effusions with adjacent atelectasis. 3. Dilated main pulmonary artery, which may be secondary to pulmonary arterial hypertension. 4. Interval development of wedge-shaped defects involving the spleen, which may represent splenic infarcts. 5. Cholelithiasis with mild gallbladder wall thickening and pericholecystic free fluid, which may be secondary to underlying liver disease. If there is clinical concern for acute cholecystitis, follow-up with ultrasound is recommended. 6. 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 7. 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 8. Additional chronic findings as detailed above. 9. Aortic Atherosclerosis (ICD10-I70.0). Electronically Signed   By: Constance Holster M.D.   On: 06/19/2019 21:02   DG Chest Port 1 View  Result Date: 06/25/2019 CLINICAL DATA:  Abnormal respiration EXAM: PORTABLE CHEST 1 VIEW COMPARISON:  06/19/2019  chest radiograph. FINDINGS: Left internal jugular central venous catheter terminates at the cavoatrial junction. Stable cardiomediastinal silhouette with mild cardiomegaly. No pneumothorax. Stable small left and trace right pleural effusions. Borderline mild pulmonary edema, improved. Bibasilar atelectasis, left greater than right, unchanged. Partially visualized abdominal aortic stent graft. IMPRESSION: 1. Borderline mild congestive heart failure, improved. 2. Stable small left and trace right pleural effusions with bibasilar atelectasis. Electronically Signed   By: Ilona Sorrel M.D.   On: 06/25/2019 05:32   DG CHEST PORT 1 VIEW  Result Date: 06/19/2019 CLINICAL DATA:  Status post central line placement EXAM: PORTABLE CHEST 1 VIEW COMPARISON:  Film from earlier in the same day. FINDINGS: Cardiac shadow is enlarged but stable. New left jugular central line is noted at the cavoatrial junction. Vascular congestion is noted with increasing pulmonary edema. No focal confluent infiltrate is seen. No bony abnormality is noted. No pneumothorax is noted. IMPRESSION: Vascular congestion with increasing edema. No pneumothorax following central line placement. Electronically Signed   By: Inez Catalina M.D.   On: 06/19/2019 17:20   DG Chest Port 1 View  Result Date: 06/19/2019 CLINICAL DATA:  Fever.  Lethargic.  Dialysis patient. EXAM: PORTABLE CHEST 1 VIEW COMPARISON:  07/27/2017 FINDINGS: Chronic cardiomegaly and aortic atherosclerosis. 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 Chronic elevation of the left hemidiaphragm with chronic left base volume loss. No acute bone finding. Previous surgical clips at the thoracic inlet. IMPRESSION: Cardiomegaly.  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 Chronic elevation of the left hemidiaphragm with chronic volume loss at the left lung base. Electronically Signed   By: Nelson Chimes M.D.   On: 06/19/2019 00:38   ECHOCARDIOGRAM COMPLETE  Result Date:  06/19/2019    ECHOCARDIOGRAM REPORT   Patient Name:   Gregory [name] Date of Exam: 06/19/2019 Medical Rec #:  [id number]       Height:       76.0 in Accession #:    [id number]      Weight:       160.5 lb Date of Birth:  1950-05-26        BSA:          2.02 m Patient Age:    86  years        BP:           67/45 mmHg Patient Gender: M               HR:           95 bpm. Exam Location:  Inpatient Procedure: 2D Echo, Cardiac Doppler and Color Doppler Indications:    Hypotension  History:        Patient has prior history of Echocardiogram examinations, most                 recent 05/15/2016. Risk Factors:Hypertension and Non-Smoker.  Sonographer:    Vickie Epley RDCS Referring Phys: [id number] [place]  1. Left ventricular ejection fraction, by estimation, is 60 to 65%. The left ventricle has normal function. The left ventricle has no regional wall motion abnormalities. Left ventricular diastolic parameters are indeterminate.  2. Right ventricular systolic function is normal. The right ventricular size is normal.  3. Left atrial size was severely dilated.  4. Right atrial size was mildly dilated.  5. The mitral valve is degenerative. Mild mitral valve regurgitation. No evidence of mitral stenosis.  6. The aortic valve is tricuspid. Aortic valve regurgitation is mild to moderate. Mild aortic valve stenosis.  7. The inferior vena cava is normal in size with greater than 50% respiratory variability, suggesting right atrial pressure of 3 mmHg. FINDINGS  Left Ventricle: Left ventricular ejection fraction, by estimation, is 60 to 65%. The left ventricle has normal function. The left ventricle has no regional wall motion abnormalities. The left ventricular internal cavity size was normal in size. There is  no left ventricular hypertrophy. Left ventricular diastolic parameters are indeterminate. Right Ventricle: The right ventricular size is normal. No increase in right ventricular wall thickness. Right ventricular  systolic function is normal. Left Atrium: Left atrial size was severely dilated. Right Atrium: Right atrial size was mildly dilated. Pericardium: There is no evidence of pericardial effusion. Mitral Valve: The mitral valve is degenerative in appearance. There is moderate thickening of the mitral valve leaflet(s). There is moderate calcification of the mitral valve leaflet(s). Normal mobility of the mitral valve leaflets. Severe mitral annular  calcification. Mild mitral valve regurgitation. No evidence of mitral valve stenosis. Tricuspid Valve: The tricuspid valve is normal in structure. Tricuspid valve regurgitation is trivial. No evidence of tricuspid stenosis. Aortic Valve: The aortic valve is tricuspid. . There is severe thickening and severe calcifcation of the aortic valve. Aortic valve regurgitation is mild to moderate. Aortic regurgitation PHT measures 201 msec. Mild aortic stenosis is present. Severe aortic valve annular calcification. There is severe thickening of the aortic valve. There is severe calcifcation of the aortic valve. Aortic valve mean gradient measures 12.3 mmHg. Aortic valve peak gradient measures 19.7 mmHg. Aortic valve area, by VTI measures 2.13 cm. Pulmonic Valve: The pulmonic valve was normal in structure. Pulmonic valve regurgitation is mild. No evidence of pulmonic stenosis. Aorta: The aortic root is normal in size and structure. Venous: The inferior vena cava is normal in size with greater than 50% respiratory variability, suggesting right atrial pressure of 3 mmHg. IAS/Shunts: No atrial level shunt detected by color flow Doppler.  LEFT VENTRICLE PLAX 2D LVIDd:         5.60 cm      Diastology LVIDs:         4.20 cm      LV e' lateral:   7.62 cm/s LV PW:  0.60 cm      LV E/e' lateral: 21.8 LV IVS:        0.60 cm      LV e' medial:    7.29 cm/s LVOT diam:     2.40 cm      LV E/e' medial:  22.8 LV SV:         99.98 ml LV SV Index:   38.29 LVOT Area:     4.52 cm  LV Volumes (MOD)  LV vol d, MOD A2C: 148.0 ml LV vol d, MOD A4C: 140.0 ml LV vol s, MOD A2C: 74.7 ml LV vol s, MOD A4C: 71.9 ml LV SV MOD A2C:     73.3 ml LV SV MOD A4C:     140.0 ml LV SV MOD BP:      72.3 ml RIGHT VENTRICLE RV S prime:     15.70 cm/s TAPSE (M-mode): 1.9 cm LEFT ATRIUM              Index       RIGHT ATRIUM           Index LA diam:        5.40 cm  2.68 cm/m  RA Area:     26.60 cm LA Vol (A2C):   164.0 ml 81.29 ml/m RA Volume:   82.00 ml  40.65 ml/m LA Vol (A4C):   156.0 ml 77.33 ml/m LA Biplane Vol: 165.0 ml 81.79 ml/m  AORTIC VALVE AV Area (Vmax):    1.91 cm AV Area (Vmean):   1.81 cm AV Area (VTI):     2.13 cm AV Vmax:           222.00 cm/s AV Vmean:          166.667 cm/s AV VTI:            0.469 m AV Peak Grad:      19.7 mmHg AV Mean Grad:      12.3 mmHg LVOT Vmax:         93.80 cm/s LVOT Vmean:        66.700 cm/s LVOT VTI:          0.221 m LVOT/AV VTI ratio: 0.47 AI PHT:            201 msec  AORTA Ao Root diam: 3.40 cm MITRAL VALVE MV Area (PHT): 6.27 cm     SHUNTS MV Decel Time: 121 msec     Systemic VTI:  0.22 m MV E velocity: 166.00 cm/s  Systemic Diam: 2.40 cm MV A velocity: 49.40 cm/s MV E/A ratio:  3.36 Jenkins Rouge MD Electronically signed by Jenkins Rouge MD Signature Date/Time: 06/19/2019/11:06:09 AM    Final    US Abdomen Limited RUQ  Result Date: 06/20/2019 CLINICAL DATA:  69 year old male with abdominal pain, abnormal CT appearance of the gallbladder yesterday. EXAM: ULTRASOUND ABDOMEN LIMITED RIGHT UPPER QUADRANT COMPARISON:  CTA chest, abdomen and pelvis 06/19/2019. FINDINGS: Gallbladder: The gallbladder appears similarly distended, with sludge in the lumen (image 5). No shadowing echogenic stones identified. There is non concentric appearing gallbladder wall thickening up to 6 mm (image 7). No pericholecystic fluid is evident. No sonographic Percell Miller sign was elicited. Common bile duct: Diameter: 7-8 mm, upper limits of normal to mildly dilated. The CBD is visible to the pancreatic head  (image 19) with no obvious filling defect. Liver: No intrahepatic ductal dilatation. Liver echogenicity within normal limits. No discrete liver lesion. Portal vein is patent on color Doppler imaging  with normal direction of blood flow towards the liver. Other: Atrophic right kidney better demonstrated by CT. IMPRESSION: 1. Both the gallbladder and the CBD appear mildly distended with sludge but no stone identified. No intrahepatic biliary ductal dilatation to strongly suggest acute bile duct obstruction. Some gallbladder wall thickening, although no sonographic Murphy sign elicited to strongly suggest acalculous cholecystitis. 2. If abdominal pain continues then a nuclear medicine hepatobiliary scan would be valuable to exclude acute CBD or cystic duct obstruction. Electronically Signed   By: Genevie Ann M.D.   On: 06/20/2019 23:50    ASSESSMENT AND PLAN:  1) Severe sepsis due to urinary tract infection by Citrobacter.  2) Severe thrombocytopenia-present on admission. PLAN Likely related to severe sepsis - PLT were low on admission even prior to antibiotic exposure. ?DIC panel no overt schistocytes Additional component of thrombocytopenia likely from antibiotics -multiple antibiotic exposure including vancomycin, meropenem cefepime and now currently on ceftriaxone. No overt bleeding at this time  3) Normocytic Anemia - hgb 13 on admission.  Hgb 9.4 today with an MCV of 89.9.  Likely multifactorial from sepsis, blood loss with dialysis, frequent phlebotomies, end-stage renal disease.  4) A. fib with RVR  5) protein calorie malnutrition  6) ESRD  7)  Posterior, interpolar left kidney mass noted on CT scan from January 2021  Plan -Patient has no overt bleeding at this time. -Transfuse platelets prophylactically if less than 10k or if bleeding -Thrombocytopenia is likely due to delayed recovery from sepsis with potential effects of 1 of multiple antibiotics that were used. -Will check immature  platelet fraction and platelet count in citrate -LDH and haptoglobin unrevealing less likely to be TTP/HUS spectrum disorder -Continue treating underlying infection. -Ongoing dialysis support will reduce the risk of platelet dysfunction from uremia. -Recommend further work-up of his left kidney mass when medically stable.  This can be done as an outpatient.   Thank you for this referral.  Mikey Bussing, DNP, AGPCNP-BC, AOCNP    ADDENDUM  .Patient was Personally and independently interviewed, examined and relevant elements of the history of present illness were reviewed in details and an assessment and plan was created. All elements of the patient's history of present illness , assessment and plan were discussed in details with Mikey Bussing, DNP. The above documentation reflects our combined findings assessment and plan.  Sullivan Lone MD MS

## 2019-06-27 NOTE — Progress Notes (Signed)
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  Sepsis suspected, patient had in-out urinary cath which produced stool. On norepi, phenylephrine, and vasopressin.  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  His wife reports that he is normally alert and oriented, but seemed to be "spaced out" and was having a hard time answering questions.  He had no URI symptoms, fever, diarrhea, known ill contacts or obvious focal neuro deficits.  He missed dialysis on 2/13 and when he was not improving his wife called EMS.  Work-up in the ED revealed leukocytosis of 25k, thrombocytopenia, lactic acid of 3.2, bilirubin 2.4 with normal LFTs.  He received 3.5 L IV fluids and empiric cefepime, vancomycin, Flagyl after which his blood pressure remained borderline and mental status only slightly improved.   Patient will answer questions and mentions left-sided back pain, denies abdominal pain. CT head was ordered and PCCM consulted for admission.  Covid-19 negative.  Past Medical History   has a past medical history of Anemia in chronic kidney disease, Arthritis, CHF (congestive heart failure) ([place]), Chronic back pain, End stage renal disease ([place]), GERD (gastroesophageal reflux disease), Gout, Heart failure  with reduced ejection fraction ([place]), and Hypertension. celiac and superior mesenteric artery aneurysms   Significant Hospital Events   2/14 Admit to [facility] 2/14 He developed SVT, was hemodynamically unstable, treated initially with adenosine but ultimately required DCCV around 715. Has remained hypotensive, received albumin and now another 2 L IV fluid resuscitation (5 L total). Interacting but confused, still not back to baseline per wife at bedside 2/15 Stool-like produced from in-out urinary cath 2/16 Urology consult, confirmed pyocystitis and drained bladder   Consults:  nephrology  Procedures:  2/14 left IJ HD cath insertion 2/16 Bedside bladder aspiration  Significant Diagnostic Tests:  2/14 CXR>>Cardiomegaly.  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  Chronic elevation of the left hemidiaphragm with chronic volume loss at the left lung base.  2/14 CT Abd 1. No acute pulmonary embolism. 2. Small bilateral pleural effusions with adjacent atelectasis. 3. Dilated main pulmonary artery, which may be secondary to pulmonary arterial hypertension. 4. Interval development of wedge-shaped defects involving the spleen, which may represent splenic infarcts. 5. Cholelithiasis with mild gallbladder wall thickening and pericholecystic free fluid, which may be secondary to underlying liver disease. If there is clinical concern for acute cholecystitis, follow-up with ultrasound is recommended. 6. 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 7. 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 8. Additional chronic findings as detailed above. 9. Aortic Atherosclerosis (ICD10-I70.0).  2/16 CT Pelvis with Rectal contrast 1. Negative for colovesicular fistula. Rectal contrast only opacifies the colon. 2. Bladder wall thickening likely from chronic outlet obstruction. The prostate is  enlarged. 3. Prominent anasarca.   CT Head  2/14 No acute intracranial finding. Atrophy. Chronic small-vessel ischemic changes. 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  Echo 2/14 . Left ventricular ejection fraction, by estimation, is 60 to 65%. The  left ventricle has normal function. The left ventricle has no regional  wall motion abnormalities. Left ventricular diastolic parameters are  indeterminate.   2. Right ventricular systolic function is normal. The right ventricular  size is normal.   3. Left atrial size was severely dilated.   4. Right atrial size was mildly dilated.   5. The mitral valve is degenerative. Mild mitral valve regurgitation. No  evidence of mitral stenosis.   6. The aortic valve is tricuspid. Aortic valve regurgitation is mild to  moderate. Mild aortic valve stenosis.   7. The inferior vena cava is normal in size with greater than 50%  respiratory variability, suggesting right atrial pressure of 3 mmHg.   RUQ U/S 2/15 1. Both the gallbladder and the CBD appear mildly distended with sludge but no stone identified. No intrahepatic biliary ductal dilatation to strongly suggest acute bile duct obstruction. Some gallbladder wall thickening, although no sonographic Murphy sign elicited to strongly suggest acalculous cholecystitis. 2. If abdominal pain continues then a nuclear medicine hepatobiliary scan would be valuable to exclude acute CBD or cystic duct obstruction.    Micro Data:  2/14 respiratory viral panel>> negative 2/14 blood cultures x2>> Citrobacter, sensitivities below 2/15 UCx Citrobacter pansensitive 2/15 UCx G(-) rods  Antimicrobials:  Flagyl 2/14 only Cefepime 2/14-d/c 2/15 Vancomycin 2/14-d/c2/15 Meropenem 2/15 -> d/c 2/16 Ceftx 2/16 -->2/27  Interim history/subjective:  Off pressors and CRRT. Still not eating  enough. Back in afib/RVR on amio drip  Objective   Blood pressure (!) 110/47, pulse (!) 128, temperature 98.5 F (36.9 C), temperature source Oral, resp. rate 17, height 6\' 4"  (1.93 m), weight 75.2 kg, SpO2 100 %.        Intake/Output Summary (Last 24 hours) at 06/27/2019 0941 Last data filed at 06/27/2019 0600 Gross per 24 hour  Intake 1422.68 ml  Output 954 ml  Net 468.68 ml   Filed Weights   06/25/19 0500 06/26/19 0500 06/27/19 0437  Weight: 79.9 kg 75.3 kg 75.2 kg   GEN: cachetic man in NAD HEENT: temporal wasting, MMM, trachea midline CV: irregular, tachycardic, ext warm PULM: Clear, no accessory muscle use GI: Soft, +BS  EXT: Muscle wasting without edema, RUE fistula NEURO: Moves all 4 ext to command, globally weak PSYCH: RASS 0, ornery SKIN: No rashes   Resolved Hospital Problem list   Septic Shock  Assessment & Plan:   Severe sepsis due to Citrobacter -Shock improved  thought originating from bladder  P Continue ceftriaxone for total of 14 days Off pressors  ESRD- iHD later in week  Acute Metabolic encephalopathy Resolved  Severe thrombocytopenia present on admission, stable but persistent P Continue to monitor No need for transfusion at this time No chemical VTE ppx  Have asked hematology to see this AM, suspicion more for delayed return of thrombopoesis or med related.  Afib/RVR P Think mostly related to being volume down and poor PO On amio drip, will add a couple doses of digoxin  Possible renal cell cancer-  Will need outpatient follow-up  Protein calorie malnutrition, dysphagia-  Dysphagia 1 diet, encourage PO intake  Unfortunately unable to take enough in, he is willing to do feeding tube Cortrak tomorrow  Celiac and superior mesenteric artery aneurysms -On CT scanning 05/2019 aneurysmal dilatation of bilateral common iliac arteries again noted. Left measures 3.5 cm in diameter, R 3.3 cm.  -Follows with vascular OP  P No  interventions required at this time  Muscular deconditioning - PT/OT consult  Should be okay for cardiac progressive. Appreciate [facility] taking over care.  Erskine Emery MD PCCM

## 2019-06-27 NOTE — Progress Notes (Signed)
[facility] Progress Note Patient Name: Gregory [name] DOB: 02/11/1951 MRN: [id number]   Date of Service  06/27/2019  HPI/Events of Note  Patient appears to be in afib on monitor. BP remains borderline despite a total of 1 liter fluid bolus. HR 130s.  eICU Interventions  Will start amiodarone infusion without giving a bolus     Intervention Category Major Interventions: Arrhythmia - evaluation and management  Shona Needles Nikolai Wilczak 06/27/2019, 4:17 AM

## 2019-06-27 NOTE — Progress Notes (Signed)
  Speech Language Pathology Treatment: Dysphagia  Patient Details Name: Gregory [name] MRN: [id number] DOB: 03-06-1951 Today's Date: 06/27/2019 Time: 3254-9826 SLP Time Calculation (min) (ACUTE ONLY): 12 min  Assessment / Plan / Recommendation Clinical Impression  This SLP upgraded pt's texture 2/19 to Dys 2 and today seen for efficiency and safety with texture. RN reported he is having a Cortrak placed tomorrow due to continued poor intake. His cognition is improving but he continues with confusion and is unable to communicate reason for eating/drinking very little. Initially he said he would eat but when tray in front of him and condiments added, he declined. SLP was able to persuade him to consume one bite of cream of wheat (no observable deficits) but not liquids. Continue Dys 2 diet/thin liquids and agree with alternative supplemental nutrition. He is transferring out of ICU today- ST will follow.    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  Work-up suggest sepsis without clear source.  CXR on 2/14 reported: "Cardiomegaly.  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       SLP Plan  Continue with current plan of care       Recommendations  Diet recommendations: Dysphagia 2 (fine chop);Thin liquid Liquids provided via: Straw Medication Administration: Crushed with puree Supervision: Patient able to self feed;Staff to assist with self feeding;Full supervision/cueing for compensatory strategies Compensations: Small sips/bites;Lingual sweep for clearance of pocketing;Clear throat intermittently Postural Changes and/or Swallow Maneuvers: Seated upright 90 degrees                Oral Care Recommendations: Oral care BID Follow up Recommendations: 24 hour supervision/assistance;Skilled Nursing facility SLP Visit Diagnosis: Dysphagia, oral phase (R13.11) Plan: Continue with current plan of care       GO                Houston Siren 06/27/2019, 11:40 AM

## 2019-06-28 LAB — FOLATE RBC
Folate, Hemolysate: 348 ng/mL
Folate, RBC: 1333 ng/mL (ref >498)
Hematocrit: 26.1 % — ABNORMAL LOW (ref 37.5–51.0)

## 2019-06-28 LAB — CBC WITH DIFFERENTIAL/PLATELET
Abs Immature Granulocytes: 0.42 10*3/uL — ABNORMAL HIGH (ref 0.00–0.07)
Basophils Absolute: 0 10*3/uL (ref 0.0–0.1)
Basophils Relative: 0 %
Eosinophils Absolute: 0 10*3/uL (ref 0.0–0.5)
Eosinophils Relative: 0 %
HCT: 27.1 % — ABNORMAL LOW (ref 39.0–52.0)
Hemoglobin: 9 g/dL — ABNORMAL LOW (ref 13.0–17.0)
Immature Granulocytes: 2 %
Lymphocytes Relative: 1 %
Lymphs Abs: 0.2 10*3/uL — ABNORMAL LOW (ref 0.7–4.0)
MCH: 29.5 pg (ref 26.0–34.0)
MCHC: 33.2 g/dL (ref 30.0–36.0)
MCV: 88.9 fL (ref 80.0–100.0)
Monocytes Absolute: 1 10*3/uL (ref 0.1–1.0)
Monocytes Relative: 4 %
Neutro Abs: 23 10*3/uL — ABNORMAL HIGH (ref 1.7–7.7)
Neutrophils Relative %: 93 %
Platelets: 27 10*3/uL — CL (ref 150–400)
RBC: 3.05 MIL/uL — ABNORMAL LOW (ref 4.22–5.81)
RDW: 18.3 % — ABNORMAL HIGH (ref 11.5–15.5)
WBC: 24.6 10*3/uL — ABNORMAL HIGH (ref 4.0–10.5)
nRBC: 0 % (ref 0.0–0.2)

## 2019-06-28 LAB — RENAL FUNCTION PANEL
Albumin: 2.7 g/dL — ABNORMAL LOW (ref 3.5–5.0)
Anion gap: 12 (ref 5–15)
BUN: 49 mg/dL — ABNORMAL HIGH (ref 8–23)
CO2: 25 mmol/L (ref 22–32)
Calcium: 6.6 mg/dL — ABNORMAL LOW (ref 8.9–10.3)
Chloride: 103 mmol/L (ref 98–111)
Creatinine, Ser: 3.3 mg/dL — ABNORMAL HIGH (ref 0.61–1.24)
GFR calc Af Amer: 21 mL/min — ABNORMAL LOW (ref >60)
GFR calc non Af Amer: 18 mL/min — ABNORMAL LOW (ref >60)
Glucose, Bld: 122 mg/dL — ABNORMAL HIGH (ref 70–99)
Phosphorus: 3.2 mg/dL (ref 2.5–4.6)
Potassium: 4.3 mmol/L (ref 3.5–5.1)
Sodium: 140 mmol/L (ref 135–145)

## 2019-06-28 LAB — TYPE AND SCREEN
ABO/RH(D): O POS
Antibody Screen: NEGATIVE

## 2019-06-28 LAB — GLUCOSE, CAPILLARY
Glucose-Capillary: 99 mg/dL (ref 70–99)
Glucose-Capillary: 94 mg/dL (ref 70–99)
Glucose-Capillary: 106 mg/dL — ABNORMAL HIGH (ref 70–99)
Glucose-Capillary: 75 mg/dL (ref 70–99)
Glucose-Capillary: 94 mg/dL (ref 70–99)

## 2019-06-28 LAB — PROCALCITONIN: Procalcitonin: 10.63 ng/mL

## 2019-06-28 LAB — MAGNESIUM: Magnesium: 2.1 mg/dL (ref 1.7–2.4)

## 2019-06-28 LAB — C-REACTIVE PROTEIN: CRP: 3.2 mg/dL — ABNORMAL HIGH (ref <1.0)

## 2019-06-28 MED ORDER — CHLORHEXIDINE GLUCONATE CLOTH 2 % EX PADS
6.0000 | MEDICATED_PAD | Freq: Every day | CUTANEOUS | Status: DC
  Administered 2019-06-29 – 2019-06-30 (×2): 6 via TOPICAL

## 2019-06-28 MED ORDER — HYDROCORTISONE NA SUCCINATE PF 100 MG IJ SOLR
50.0000 mg | Freq: Every day | INTRAMUSCULAR | Status: AC
  Administered 2019-06-28 – 2019-06-30 (×3): 50 mg via INTRAVENOUS
  Filled 2019-06-28 (×3): qty 2

## 2019-06-28 MED ORDER — SODIUM CHLORIDE 0.9% IV SOLUTION: Freq: Once | INTRAVENOUS | Status: DC

## 2019-06-28 MED ORDER — DIPHENHYDRAMINE HCL 50 MG/ML IJ SOLN: 25.0000 mg | Freq: Four times a day (QID) | INTRAMUSCULAR | Status: DC | PRN

## 2019-06-28 MED ORDER — FAMOTIDINE 20 MG PO TABS
20.0000 mg | ORAL_TABLET | Freq: Every day | ORAL | Status: DC
  Administered 2019-06-29 – 2019-07-03 (×4): 20 mg via ORAL
  Filled 2019-06-28 (×5): qty 1

## 2019-06-28 MED ORDER — DIGOXIN 0.25 MG/ML IJ SOLN
0.2500 mg | Freq: Every day | INTRAMUSCULAR | Status: AC
  Administered 2019-06-28: 12:00:00 0.25 mg via INTRAVENOUS
  Filled 2019-06-28: qty 2

## 2019-06-28 MED ORDER — CEFAZOLIN SODIUM-DEXTROSE 1-4 GM/50ML-% IV SOLN
1.0000 g | INTRAVENOUS | Status: AC
  Administered 2019-06-28 – 2019-07-02 (×5): 1 g via INTRAVENOUS
  Filled 2019-06-28 (×7): qty 50

## 2019-06-28 NOTE — Evaluation (Signed)
Physical Therapy Evaluation Patient Details Name: Gregory [name] MRN: [id number] DOB: [birth year] Today's Date: 06/28/2019   History of Present Illness  69yo male with 2 days of lethargy and confusion/AMS. CTH negative, negative for PE, covid negative at admission. Admitted for sepsis workup, encephalopathy. PMH ESRD on HD, anemia, CHF, HTN, celiac and mesenteric artery aneurysms, iliac artery aneurysm s/p graft repair  Clinical Impression   Patient received in bed, needed lots of encouraging and coaxing to participate today. A&O to self only, also with cognition deficits including slow processing and perseveration on getting his clothes and getting into his vehicle, seeing MD. Needed constant supervision with cues not to touch or pull on neck central line. Needed heavy levels of assist for movement secondary to poor initiation and cognitive deficits. HR to low 130s just sitting EOB. Began to become somewhat agitated with PT/OT at end of session and required totalAX2 to return to supine, he then started yelling "abuse, abuse" as therapists got him repositioned in the bed and reapplied posey mitts. Note he was able to start to undo mitts with his teeth- RN notified. Might be a good candidate for CIR.     Follow Up Recommendations CIR;Supervision/Assistance - 24 hour    Equipment Recommendations  Other (comment)(TBD/defer)    Recommendations for Other Services       Precautions / Restrictions Precautions Precautions: Fall;Other (comment) Precaution Comments: watch HR Restrictions Weight Bearing Restrictions: No      Mobility  Bed Mobility Overal bed mobility: Needs Assistance Bed Mobility: Supine to Sit;Sit to Supine     Supine to sit: Max assist;+2 for physical assistance Sit to supine: Total assist;+2 for physical assistance   General bed mobility comments: max-totalA due to poor cognition and initiation. Did make inconsistent efforts to slide LEs to the side of the bed. Once up  able to maintain sitting at EOB with min guard-S  Transfers                 General transfer comment: deferred- HR  Ambulation/Gait             General Gait Details: deferred- HR  Stairs            Wheelchair Mobility    Modified Rankin (Stroke Patients Only)       Balance Overall balance assessment: Needs assistance Sitting-balance support: No upper extremity supported;Feet supported Sitting balance-Leahy Scale: Good                                       Pertinent Vitals/Pain Pain Assessment: Faces Pain Score: 0-No pain Faces Pain Scale: No hurt Pain Intervention(s): Limited activity within patient's tolerance;Monitored during session    Home Living Family/patient expects to be discharged to:: Private residence Living Arrangements: Spouse/significant other               Additional Comments: unable to get any information about home/levels of assist from patient due to cognition- from chart he does live with a spouse    Prior Function           Comments: unsure- unable to get clear info from patient     Hand Dominance        Extremity/Trunk Assessment   Upper Extremity Assessment Upper Extremity Assessment: Defer to OT evaluation    Lower Extremity Assessment Lower Extremity Assessment: Difficult to assess due to impaired cognition    Cervical /  Trunk Assessment Cervical / Trunk Assessment: Normal  Communication      Cognition Arousal/Alertness: Awake/alert Behavior During Therapy: Restless;Flat affect Overall Cognitive Status: Impaired/Different from baseline Area of Impairment: Orientation;Attention;Memory;Following commands;Safety/judgement;Awareness;Problem solving                 Orientation Level: Disoriented to;Place;Time;Situation Current Attention Level: Sustained Memory: Decreased recall of precautions;Decreased short-term memory Following Commands: Follows one step commands  inconsistently;Follows one step commands with increased time Safety/Judgement: Decreased awareness of safety;Decreased awareness of deficits Awareness: Intellectual Problem Solving: Slow processing;Decreased initiation;Difficulty sequencing;Requires verbal cues;Requires tactile cues General Comments: very poor safety awareness- needed multiple cues to not pull at lines in his neck. Unable to use cues/clues from therapist to determine which month it is. Perseverated on getting clothes, seeing MD, and getting in his vehicle. Got riled up and physically resistant with return to bed and kept yelling "abuse, abuse" when PT/OT had to put mitts back on and use totalAx2 to return to bed      General Comments General comments (skin integrity, edema, etc.): HR to low 130s sitting at EOB    Exercises     Assessment/Plan    PT Assessment Patient needs continued PT services  PT Problem List Decreased strength;Decreased knowledge of use of DME;Decreased activity tolerance;Decreased safety awareness;Decreased balance;Decreased knowledge of precautions;Decreased mobility;Decreased coordination       PT Treatment Interventions DME instruction;Balance training;Gait training;Stair training;Cognitive remediation;Functional mobility training;Patient/family education;Therapeutic activities;Therapeutic exercise    PT Goals (Current goals can be found in the Care Plan section)  Acute Rehab PT Goals PT Goal Formulation: Patient unable to participate in goal setting Time For Goal Achievement: 07/12/19 Potential to Achieve Goals: Fair    Frequency Min 3X/week   Barriers to discharge        Co-evaluation PT/OT/SLP Co-Evaluation/Treatment: Yes Reason for Co-Treatment: Complexity of the patient's impairments (multi-system involvement);Necessary to address cognition/behavior during functional activity;For patient/therapist safety PT goals addressed during session: Mobility/safety with mobility;Balance          AM-PAC PT "6 Clicks" Mobility  Outcome Measure Help needed turning from your back to your side while in a flat bed without using bedrails?: A Little Help needed moving from lying on your back to sitting on the side of a flat bed without using bedrails?: A Lot Help needed moving to and from a bed to a chair (including a wheelchair)?: Total Help needed standing up from a chair using your arms (e.g., wheelchair or bedside chair)?: Total Help needed to walk in hospital room?: Total Help needed climbing 3-5 steps with a railing? : Total 6 Click Score: 9    End of Session   Activity Tolerance: Patient tolerated treatment well;Other (comment)(limited by cognition) Patient left: in bed;with call bell/phone within reach;with bed alarm set;with restraints reapplied Nurse Communication: Mobility status;Other (comment)(able to remove mitts using teeth, may need safety sitter) PT Visit Diagnosis: Difficulty in walking, not elsewhere classified (R26.2);Muscle weakness (generalized) (M62.81)    Time: 2841-3244 PT Time Calculation (min) (ACUTE ONLY): 32 min   Charges:   PT Evaluation $PT Eval Moderate Complexity: 1 Mod          Windell Norfolk, DPT, PN1   Supplemental Physical Therapist [place]    Pager [phone] Acute Rehab Office [phone]

## 2019-06-28 NOTE — Progress Notes (Signed)
Rehab Admissions Coordinator Note:  Patient was screened by Cleatrice Burke for appropriateness for an Inpatient Acute Rehab Consult per PT recs. .  At this time, we are recommending Inpatient Rehab consult. I will place order per protocol.  Cleatrice Burke RN MSN 06/28/2019, 12:17 PM  I can be reached at [phone].

## 2019-06-28 NOTE — Progress Notes (Signed)
[facility] Progress Note   Assessment/ Plan:   [facility]: [facility]   3h 52min  400/800  73kg  2/3.5Ca bath  P4  AVF  Hep 3300 No VDRA/ESA  OP Labs 06/10/19: Hgb 12.5 Ca 8.0 Phos 3.9 Alb 3.4  PTH 1743  Assessment/Plan: 1. Septic shock - urine and blood cultures + for citrobacter koseri, +splenic infarcts on CT abd/ pelvis. Initially had distended bladder and thought to be stool, then seen by urology and dx'd with pyocystitis, s/p Foley drainage.  On antibiotics, pressors off.   2. AMS: CT head negative, has aneurysmal dilatation of bilateral cervical ICAs.  Slowly improving.  3. Thrombocytopenia: plts down to 13 and 15, has no schistocytes on smear, LDH 209, haptoglobin neg, not consistent with TTP/HUS, suspect sepsis related. 4. ESRD -  HD MWF. Had CRRT here 2/15 - 06/26/19. Plan next HD Wed/ tomorrow on sched.  5. Hypotension/volume  - BP better. On midodrine, stress dose steroids 6. Anemia  ckd -  on darbe weekly 2/19 7. Aflutter: improved, per primary 8. Metabolic bone disease -  Ca ok. Low Phos. Hold Velphoro binder for now. On Sensipar.   Kelly Splinter, MD 06/28/2019, 10:23 AM      Subjective:    Stable, HR still high off and on , afib/ flutter      Objective:   BP (!) 112/52 (BP Location: Left Leg)   Pulse 92   Temp 97.7 F (36.5 C) (Oral)   Resp 18   Ht 6\' 4"  (1.93 m)   Wt 75 kg   SpO2 100%   BMI 20.13 kg/m   Physical Exam: Gen: sitting in bed, appears ill, sluggish responses, awake and follows simple commands CVS: tachycardic, no m/r/g Resp: normal WOB, clear anteriorly Abd: soft, NABS GU: Foley out Ext: no sig edema ACCESS: RUE AVF +T/B, aneurysmal, L IJ trialysis catheter NEURO: awake, a little more interactive MSK: sarcopenic  Labs: BMET Recent Labs  Lab 06/24/19 1732 06/25/19 0550 06/25/19 1538 06/26/19 0506 06/26/19 1542 06/27/19 0258 06/28/19 0522  NA 136 137 141 139 142 138 140  K 3.7 4.0 3.9 4.1 3.8 4.1 4.3  CL 102 103 99  104 99 103 103  CO2 25 25 30 27 25 25 25   GLUCOSE 145* 120* 142* 127* 138* 115* 122*  BUN 15 13 13 16 17  28* 49*  CREATININE 1.36* 1.51* 1.50* 1.42* 1.38* 2.02* 3.30*  CALCIUM 7.4* 7.1* 7.7* 7.6* 6.8* 7.1* 6.6*  PHOS 2.4* 2.1* 2.1* 1.8* 3.5 2.6 3.2   CBC Recent Labs  Lab 06/22/19 0319 06/23/19 0306 06/24/19 0345 06/24/19 0345 06/25/19 0457 06/26/19 0506 06/27/19 0259 06/27/19 1623  WBC 16.5*   < > 14.5*  --  16.4* 19.9* 19.8*  --   NEUTROABS 14.9*  --   --   --   --   --   --   --   HGB 10.7*   < > 8.8*  --  8.8* 10.5* 9.4*  --   HCT 31.1*   < > 26.3*  --  26.2* 31.0* 28.4*  --   MCV 85.2   < > 88.0  --  87.9 88.8 89.9  --   PLT 15*   < > 18*   < > 16* 13* 11* 9*   < > = values in this interval not displayed.      Medications:    . amiodarone  150 mg Intravenous Once  . Chlorhexidine Gluconate Cloth  6 each  Topical Daily  . darbepoetin (ARANESP) injection - NON-DIALYSIS  60 mcg Subcutaneous Q Fri-1800  . feeding supplement (NEPRO CARB STEADY)  237 mL Oral TID BM  . mouth rinse  15 mL Mouth Rinse BID  . midodrine  10 mg Oral TID WC  . tamsulosin  0.4 mg Oral Daily

## 2019-06-28 NOTE — Progress Notes (Signed)
Cortrak Team Brief Note  Cortrak team consulted for Cortrak placement. Patient's most recent platelet level was 9 K/uL; cutoff is considered 20 K/uL. RD sent secure chat to MD about platelets to see if MD would like [place] to proceed. MD has instructed to hold off on Cortrak placement.   Larkin Ina, MS, RD, LDN RD pager number and weekend/on-call pager number located in [place].

## 2019-06-28 NOTE — Evaluation (Signed)
Occupational Therapy Evaluation Patient Details Name: Gregory [name] MRN: [id number] DOB: 12-13-1950 Today's Date: 06/28/2019    History of Present Illness 69yo male with 2 days of lethargy and confusion/AMS. CTH negative, negative for PE, covid negative at admission. Admitted for sepsis workup, encephalopathy. PMH ESRD on HD, anemia, CHF, HTN, celiac and mesenteric artery aneurysms, iliac artery aneurysm s/p graft repair   Clinical Impression   Patient admitted for above and limited by problem list below, including generalized weakness, decreased activity tolerance and impaired cognition.  Patient oriented to self, time with choices/cueing; presenting with poor awareness to safety and deficits, slow processing and impaired problem solving.  He requires significantly increased time to initate and sequencing basic tasks, poor attention to task and perseverating on needing his clothes, getting to his vehicle and seeing the MD requiring constant redirection.  Patient requires total assist for self care at this time (resistant to washing face when provided hand over hand support), max assist +2 to transition to EOB and total assist +2 to return supine.  When assisting back to bed, washing face, and donning mitts patient became agitated and voices "abuse, abuse"; once supine patient pulling at mitts with teeth.  Noted HR increased to low 130s with sitting EOB.  Patient will benefit from continued OT services while admitted and after dc at CIR level to optimize independence and safety with ADLs, mobility.        Follow Up Recommendations  CIR;Supervision/Assistance - 24 hour    Equipment Recommendations  Other (comment)(TBD at next venue of care)    Recommendations for Other Services       Precautions / Restrictions Precautions Precautions: Fall;Other (comment) Precaution Comments: watch HR Restrictions Weight Bearing Restrictions: No      Mobility Bed Mobility Overal bed mobility: Needs  Assistance Bed Mobility: Supine to Sit;Sit to Supine     Supine to sit: Max assist;+2 for physical assistance Sit to supine: Total assist;+2 for physical assistance   General bed mobility comments: max-totalA due to poor cognition and initiation. Did make inconsistent efforts to slide LEs to the side of the bed. Once up able to maintain sitting at EOB with min guard-S  Transfers                 General transfer comment: deferred- HR/cognition    Balance Overall balance assessment: Needs assistance Sitting-balance support: No upper extremity supported;Feet supported Sitting balance-Leahy Scale: Good                                     ADL either performed or assessed with clinical judgement   ADL Overall ADL's : Needs assistance/impaired                                     Functional mobility during ADLs: Maximal assistance;+2 for physical assistance;+2 for safety/equipment General ADL Comments: pt requires total assist for all self care at this time, attempted to engage patient in washing face with hand over hand support with patient resisting and voicing "abuse"      Vision         Perception     Praxis      Pertinent Vitals/Pain Pain Assessment: Faces Pain Score: 0-No pain Faces Pain Scale: No hurt Pain Intervention(s): Limited activity within patient's tolerance;Monitored during session  Hand Dominance     Extremity/Trunk Assessment Upper Extremity Assessment Upper Extremity Assessment: Generalized weakness;Difficult to assess due to impaired cognition   Lower Extremity Assessment Lower Extremity Assessment: Defer to PT evaluation   Cervical / Trunk Assessment Cervical / Trunk Assessment: Normal   Communication     Cognition Arousal/Alertness: Awake/alert Behavior During Therapy: Restless;Flat affect Overall Cognitive Status: Impaired/Different from baseline Area of Impairment:  Orientation;Attention;Memory;Following commands;Safety/judgement;Awareness;Problem solving                 Orientation Level: Disoriented to;Place;Time;Situation Current Attention Level: Sustained Memory: Decreased recall of precautions;Decreased short-term memory Following Commands: Follows one step commands inconsistently;Follows one step commands with increased time Safety/Judgement: Decreased awareness of safety;Decreased awareness of deficits Awareness: Intellectual Problem Solving: Slow processing;Decreased initiation;Difficulty sequencing;Requires verbal cues;Requires tactile cues General Comments: patient oriented to self, able to determine year/month with choices/cueing.  Requires cueing to avoid pulling at lines, perseverating on seeing MD, getting his clothes and getting his vehicle.  Patient with poor awareness, slow processing and decreased problem solving.  Verbalizes "abuse, abuse" when OT/PT assisted with mobility and ADLs.    General Comments  HR to low 130s sitting at EOB     Exercises     Shoulder Instructions      Home Living Family/patient expects to be discharged to:: Private residence Living Arrangements: Spouse/significant other                               Additional Comments: unable to get any information about home/levels of assist from patient due to cognition- from chart he does live with a spouse      Prior Functioning/Environment          Comments: unsure- unable to get clear info from patient        OT Problem List: Decreased strength;Decreased activity tolerance;Impaired balance (sitting and/or standing);Decreased cognition;Decreased coordination;Decreased safety awareness;Decreased knowledge of use of DME or AE;Decreased knowledge of precautions;Cardiopulmonary status limiting activity      OT Treatment/Interventions: Self-care/ADL training;Energy conservation;DME and/or AE instruction;Cognitive  remediation/compensation;Therapeutic activities;Balance training;Patient/family education;Therapeutic exercise    OT Goals(Current goals can be found in the care plan section) Acute Rehab OT Goals Patient Stated Goal: none stated OT Goal Formulation: Patient unable to participate in goal setting Time For Goal Achievement: 07/12/19 Potential to Achieve Goals: Good  OT Frequency: Min 2X/week   Barriers to D/C:            Co-evaluation PT/OT/SLP Co-Evaluation/Treatment: Yes Reason for Co-Treatment: Necessary to address cognition/behavior during functional activity;For patient/therapist safety;To address functional/ADL transfers PT goals addressed during session: Mobility/safety with mobility;Balance OT goals addressed during session: ADL's and self-care      AM-PAC OT "6 Clicks" Daily Activity     Outcome Measure Help from another person eating meals?: Total Help from another person taking care of personal grooming?: Total Help from another person toileting, which includes using toliet, bedpan, or urinal?: Total Help from another person bathing (including washing, rinsing, drying)?: Total Help from another person to put on and taking off regular upper body clothing?: Total Help from another person to put on and taking off regular lower body clothing?: Total 6 Click Score: 6   End of Session Nurse Communication: Mobility status;Other (comment);Precautions(pt biting off mits)  Activity Tolerance: Treatment limited secondary to agitation Patient left: in bed;with call bell/phone within reach;with bed alarm set;with restraints reapplied  OT Visit Diagnosis: Other abnormalities of gait and mobility (  R26.89);Muscle weakness (generalized) (M62.81);Other symptoms and signs involving cognitive function                Time: 4327-6147 OT Time Calculation (min): 32 min Charges:  OT General Charges $OT Visit: 1 Visit OT Evaluation $OT Eval Moderate Complexity: 1 Mod  Gregory [name],  OT Acute Rehabilitation Services Pager [phone] Office [phone]    Gregory [name] 06/28/2019, 3:11 PM

## 2019-06-28 NOTE — Progress Notes (Signed)
Inpatient Rehabilitation Admissions Coordinator  Inpatient rehab consult received. I will follow up tomorrow to assess patient and assist with planning dispo options.  Danne Baxter, RN, MSN Rehab Admissions Coordinator [phone] 06/28/2019 4:43 PM

## 2019-06-28 NOTE — Progress Notes (Addendum)
PROGRESS NOTE                                                                                                                                                                                                             Patient Demographics:    Gregory [name], is a 69 y.o. male, DOB - 1951/01/19, ION:[id number]  Outpatient Primary MD for the patient is Nicholos Johns, MD    LOS - 9  Admit date - 06/17/2019    Chief Complaint  Patient presents with  . Weakness  . 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    Was diagnosed with septic shock with severe thrombocytopenia, source was bladder infection.  He was seen by urology, nephrology, hematology.  PCCM was the primary service.  He was transferred to hospitalist service on 06/17/2019 on day 9 of his hospital stay.  Still extremely frail and tenuous.   Subjective:    Gregory [name] today in bed appears extremely lethargic, denies any headache chest or abdominal pain, no shortness of breath.   Assessment  & Plan :      1.  Septic shock from Pyeocystitis - Citrobacter bacteremia, causing severe thrombocytopenia.  Based on culture and sensitivity data he is currently on Ancef, mildly improved but overall still septic, will trend CRP and procalcitonin, case discussed with urologist Dr. Bess Harvest and nephrologist Dr. Melvia Heaps, his bladder pus was drained with Foley catheter and does not require a indwelling Foley.  Continue antibiotics.  Continue to monitor closely.  Overall extremely tenuous.  Updated wife in detail.  Was on IV Solu-Cortef which I will continue for another 3 days at low-dose.  SpO2: 100 % O2 Flow  Rate (L/min): 3 L/min  No results for input(s): CRP, DDIMER, FERRITIN, BNP, PROCALCITON, SARSCOV2NAA in the last 168 hours.   2.  Severe thrombocytopenia.  Likely due to septic shock and bone marrow suppression, seen by hematologist this admission, platelet count is now below 9000 we will transfuse 1 unit of platelets.  Monitor CBC closely.  3.  BPH.  On Flomax.  Hardly  makes any urine anymore.  4.  ESRD.  Dialysis per schedule, nephrology on board.  5.  Dysphagia.  On dysphagia 2 diet.  Speech following.  Avoid core track or NG tube due to severe thrombocytopenia.  6.  New onset A. fib RVR.  Mali vas 2 score of at least 3.  Currently on amnio drip for rate control, currently not a candidate for anticoagulation due to severe thrombocytopenia.  Echogram stable.  Will check TSH.  If rate control becomes an issue cardiology will be consulted. Will try 2 doses of IV digoxin.  7.  History of celiac and superior mesenteric artery aneurysms.  Outpatient follow-up with vascular surgery.  8.  Severe deconditioning.  PT OT.  May require placement.  9.  Severe PCM.  Placed on prostat.    Condition - Extremely Guarded  Family Communication  :  Wife 2/23  Code Status :  Full  Diet :   Diet Order            DIET DYS 2 Room service appropriate? Yes; Fluid consistency: Thin  Diet effective now               Disposition Plan  : Stay in PCU, still in septic shock getting treatment with IV antibiotics, platelet transfusions, extremely sick.  Consults  : Urology, nephrology, PCCM  Procedures  :    TTE   Echo 2/14  1. Left ventricular ejection fraction, by estimation, is 60 to 65%. The left ventricle has normal function. The left ventricle has no regional wall motion abnormalities. Left ventricular diastolic parameters are indeterminate.  2. Right ventricular systolic function is normal. The right ventricular size is normal.  3. Left atrial size was severely dilated.  4. Right  atrial size was mildly dilated.  5. The mitral valve is degenerative. Mild mitral valve regurgitation. No evidence of mitral stenosis.  6. The aortic valve is tricuspid. Aortic valve regurgitation is mild to moderate. Mild aortic valve stenosis.  7. The inferior vena cava is normal in size with greater than 50% respiratory variability, suggesting right atrial pressure of 3 mmHg.    RUQ [place] -    1. Both the gallbladder and the CBD appear mildly distended with sludge but no stone identified. No intrahepatic biliary ductal dilatation to strongly suggest acute bile duct obstruction. Some gallbladder wall thickening, although no sonographic Murphy sign elicited to strongly suggest acalculous cholecystitis.  2. If abdominal pain continues then a nuclear medicine hepatobiliary scan would be valuable to exclude acute CBD or cystic duct Obstruction.   CT Head - non acute  RUQ [place] - non acute  CT Chest - Abd - Pelvis -  1. No acute pulmonary embolism. 2. Small bilateral pleural effusions with adjacent atelectasis. 3. Dilated main pulmonary artery, which may be secondary to pulmonary arterial hypertension. 4. Interval development of wedge-shaped defects involving the spleen, which may represent splenic infarcts. 5. Cholelithiasis with mild gallbladder wall thickening and pericholecystic free fluid, which may be secondary to underlying liver disease. If there is clinical concern for acute cholecystitis, follow-up with ultrasound is recommended. 6. 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 7. 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 8. Additional chronic findings as detailed above. 9. Aortic Atherosclerosis.    PUD Prophylaxis : Pepcid  DVT Prophylaxis  : SCDs.  Lab Results  Component Value Date   PLT 9 (LL) 06/27/2019    Inpatient  Medications  Scheduled Meds: . amiodarone  150 mg Intravenous  Once  . Chlorhexidine Gluconate Cloth  6 each Topical Daily  . Chlorhexidine Gluconate Cloth  6 each Topical Q0600  . darbepoetin (ARANESP) injection - NON-DIALYSIS  60 mcg Subcutaneous Q Fri-1800  . feeding supplement (NEPRO CARB STEADY)  237 mL Oral TID BM  . mouth rinse  15 mL Mouth Rinse BID  . midodrine  10 mg Oral TID WC  . tamsulosin  0.4 mg Oral Daily   Continuous Infusions: . sodium chloride Stopped (06/23/19 1406)  . amiodarone 30 mg/hr (06/28/19 0831)  .  ceFAZolin (ANCEF) IV     PRN Meds:.ondansetron (ZOFRAN) IV  Antibiotics  :    Anti-infectives (From admission, onward)   Start     Dose/Rate Route Frequency Ordered Stop   06/28/19 0930  ceFAZolin (ANCEF) IVPB 1 g/50 mL premix     1 g 100 mL/hr over 30 Minutes Intravenous Every 24 hours 06/28/19 0924 07/03/19 0929   06/21/19 1400  cefTRIAXone (ROCEPHIN) 2 g in sodium chloride 0.9 % 100 mL IVPB  Status:  Discontinued     2 g 200 mL/hr over 30 Minutes Intravenous Every 24 hours 06/21/19 1002 06/28/19 0924   06/20/19 1200  vancomycin (VANCOCIN) IVPB 1000 mg/200 mL premix  Status:  Discontinued     1,000 mg 200 mL/hr over 60 Minutes Intravenous Every M-W-F (Hemodialysis) 06/19/19 0232 06/20/19 1109   06/20/19 1200  meropenem (MERREM) 1 g in sodium chloride 0.9 % 100 mL IVPB  Status:  Discontinued     1 g 200 mL/hr over 30 Minutes Intravenous Every 8 hours 06/20/19 1112 06/21/19 1002   06/19/19 2200  ceFEPIme (MAXIPIME) 1 g in sodium chloride 0.9 % 100 mL IVPB  Status:  Discontinued     1 g 200 mL/hr over 30 Minutes Intravenous Every 24 hours 06/19/19 0232 06/20/19 1109   06/19/19 0230  vancomycin (VANCOREADY) IVPB 750 mg/150 mL     750 mg 150 mL/hr over 60 Minutes Intravenous  Once 06/19/19 0227 06/19/19 0435   06/29/2019 2330  ceFEPIme (MAXIPIME) 2 g in sodium chloride 0.9 % 100 mL IVPB     2 g 200 mL/hr over 30 Minutes Intravenous  Once 06/06/2019 2329 06/19/19 0052   07/01/2019 2330  metroNIDAZOLE (FLAGYL) IVPB 500 mg      500 mg 100 mL/hr over 60 Minutes Intravenous  Once 06/22/2019 2329 06/19/19 0205   06/20/2019 2330  vancomycin (VANCOCIN) IVPB 1000 mg/200 mL premix     1,000 mg 200 mL/hr over 60 Minutes Intravenous  Once 06/13/2019 2329 06/19/19 0205       Time Spent in minutes  30   Lala Lund M.D on 06/28/2019 at 10:43 AM  To page go to [email or web address] - password [place]  [facility] -  Office  [phone]   See all Orders from today for further details    Objective:   Vitals:   06/28/19 0600 06/28/19 0603 06/28/19 0635 06/28/19 0900  BP:  (!) 104/41  (!) 112/52  Pulse: (!) 116 (!) 111 92   Resp: 15 13 18    Temp:    97.7 F (36.5 C)  TempSrc:    Oral  SpO2: 100% 100% 100% 100%  Weight: 75 kg     Height:        Wt Readings from Last 3 Encounters:  06/28/19 75 kg  02/03/19 80.5 kg  03/11/18 83.5 kg     Intake/Output Summary (Last 24 hours)  at 06/28/2019 1043 Last data filed at 06/28/2019 1006 Gross per 24 hour  Intake 621.45 ml  Output --  Net 621.45 ml     Physical Exam  In bed appears extremely lethargic and frail, left IJ central line in place, [place].AT,PERRAL Supple Neck,No JVD, No cervical lymphadenopathy appriciated.  Symmetrical Chest wall movement, Good air movement bilaterally, CTAB RRR,No Gallops,Rubs or new Murmurs, No Parasternal Heave +ve B.Sounds, Abd Soft, No tenderness, No organomegaly appriciated, No rebound - guarding or rigidity. No Cyanosis, Clubbing or edema, No new Rash or bruise      Data Review:    CBC Recent Labs  Lab 06/22/19 0319 06/22/19 0319 06/23/19 0306 06/23/19 0306 06/24/19 0345 06/25/19 0457 06/26/19 0506 06/27/19 0259 06/27/19 1623  WBC 16.5*   < > 15.1*  --  14.5* 16.4* 19.9* 19.8*  --   HGB 10.7*   < > 10.3*  --  8.8* 8.8* 10.5* 9.4*  --   HCT 31.1*   < > 30.5*  --  26.3* 26.2* 31.0* 28.4*  --   PLT 15*   < > 15*   < > 18* 16* 13* 11* 9*  MCV 85.2   < > 85.2  --  88.0 87.9 88.8 89.9  --   MCH 29.3   < > 28.8  --   29.4 29.5 30.1 29.7  --   MCHC 34.4   < > 33.8  --  33.5 33.6 33.9 33.1  --   RDW 15.9*   < > 16.0*  --  16.0* 15.9* 16.7* 17.5*  --   LYMPHSABS 0.3*  --   --   --   --   --   --   --   --   MONOABS 0.9  --   --   --   --   --   --   --   --   EOSABS 0.0  --   --   --   --   --   --   --   --   BASOSABS 0.0  --   --   --   --   --   --   --   --    < > = values in this interval not displayed.    Chemistries  Recent Labs  Lab 06/25/19 0457 06/25/19 0550 06/25/19 1538 06/26/19 0506 06/26/19 1542 06/26/19 1902 06/27/19 0258 06/27/19 0259 06/28/19 0522  NA  --    < > 141 139 142  --  138  --  140  K  --    < > 3.9 4.1 3.8  --  4.1  --  4.3  CL  --    < > 99 104 99  --  103  --  103  CO2  --    < > 30 27 25   --  25  --  25  GLUCOSE  --    < > 142* 127* 138*  --  115*  --  122*  BUN  --    < > 13 16 17   --  28*  --  49*  CREATININE  --    < > 1.50* 1.42* 1.38*  --  2.02*  --  3.30*  CALCIUM  --    < > 7.7* 7.6* 6.8*  --  7.1*  --  6.6*  MG 2.3  --   --  2.3  --  2.3  --  2.3 2.1   < > =  values in this interval not displayed.   ------------------------------------------------------------------------------------------------------------------ No results for input(s): CHOL, HDL, LDLCALC, TRIG, CHOLHDL, LDLDIRECT in the last 72 hours.  No results found for: HGBA1C ------------------------------------------------------------------------------------------------------------------ No results for input(s): TSH, T4TOTAL, T3FREE, THYROIDAB in the last 72 hours.  Invalid input(s): FREET3  Cardiac Enzymes No results for input(s): CKMB, TROPONINI, MYOGLOBIN in the last 168 hours.  Invalid input(s): CK ------------------------------------------------------------------------------------------------------------------ No results found for: BNP  Micro Results Recent Results (from the past 240 hour(s))  Blood Culture (routine x 2)     Status: Abnormal   Collection Time: 06/19/19 12:00 AM    Specimen: BLOOD  Result Value Ref Range Status   Specimen Description BLOOD LEFT ANTECUBITAL  Final   Special Requests   Final    BOTTLES DRAWN AEROBIC AND ANAEROBIC Blood Culture results may not be optimal due to an inadequate volume of blood received in culture bottles   Culture  Setup Time   Final    GRAM NEGATIVE RODS IN BOTH AEROBIC AND ANAEROBIC BOTTLES CRITICAL RESULT CALLED TO, READ BACK BY AND VERIFIED WITH: L. CURRAN, PHARMD AT 8299 ON 06/19/19 BY C. JESSUP, MT.    Culture (A)  Final    CITROBACTER KOSERI SUSCEPTIBILITIES PERFORMED ON PREVIOUS CULTURE WITHIN THE LAST 5 DAYS. Performed at [facility], [place] [address] [place], [place] [address]    Report Status 06/21/2019 FINAL  Final  Blood Culture (routine x 2)     Status: Abnormal   Collection Time: 06/19/19 12:00 AM   Specimen: BLOOD  Result Value Ref Range Status   Specimen Description BLOOD LEFT ANTECUBITAL  Final   Special Requests   Final    BOTTLES DRAWN AEROBIC AND ANAEROBIC Blood Culture results may not be optimal due to an inadequate volume of blood received in culture bottles   Culture  Setup Time   Final    GRAM NEGATIVE RODS IN BOTH AEROBIC AND ANAEROBIC BOTTLES CRITICAL RESULT CALLED TO, READ BACK BY AND VERIFIED WITH: L. Farrel Conners, PHARMD AT 6789 ON 06/19/19 BY C. JESSUP, MT. Performed at [facility], [place] [address] [place], [place] [address]    Culture CITROBACTER KOSERI (A)  Final   Report Status 06/21/2019 FINAL  Final   Organism ID, Bacteria CITROBACTER KOSERI  Final      Susceptibility   Citrobacter koseri - MIC*    CEFAZOLIN <=4 SENSITIVE Sensitive     CEFEPIME <=0.12 SENSITIVE Sensitive     CEFTAZIDIME <=1 SENSITIVE Sensitive     CEFTRIAXONE <=0.25 SENSITIVE Sensitive     CIPROFLOXACIN <=0.25 SENSITIVE Sensitive     GENTAMICIN <=1 SENSITIVE Sensitive     IMIPENEM <=0.25 SENSITIVE Sensitive     TRIMETH/SULFA <=20 SENSITIVE Sensitive     PIP/TAZO <=4 SENSITIVE Sensitive     *  CITROBACTER KOSERI  Blood Culture ID Panel (Reflexed)     Status: Abnormal   Collection Time: 06/19/19 12:00 AM  Result Value Ref Range Status   Enterococcus species NOT DETECTED NOT DETECTED Final   Listeria monocytogenes NOT DETECTED NOT DETECTED Final   Staphylococcus species NOT DETECTED NOT DETECTED Final   Staphylococcus aureus (BCID) NOT DETECTED NOT DETECTED Final   Streptococcus species NOT DETECTED NOT DETECTED Final   Streptococcus agalactiae NOT DETECTED NOT DETECTED Final   Streptococcus pneumoniae NOT DETECTED NOT DETECTED Final   Streptococcus pyogenes NOT DETECTED NOT DETECTED Final   Acinetobacter baumannii NOT DETECTED NOT DETECTED Final   Enterobacteriaceae species DETECTED (A) NOT DETECTED Final  Comment: Enterobacteriaceae represent a large family of gram negative bacteria, not a single organism. Refer to culture for further identification. CRITICAL RESULT CALLED TO, READ BACK BY AND VERIFIED WITH: L. CURRAN, PHARMD AT 9417 ON 06/19/19 BY C. JESSUP, MT.    Enterobacter cloacae complex NOT DETECTED NOT DETECTED Final   Escherichia coli NOT DETECTED NOT DETECTED Final   Klebsiella oxytoca NOT DETECTED NOT DETECTED Final   Klebsiella pneumoniae NOT DETECTED NOT DETECTED Final   Proteus species NOT DETECTED NOT DETECTED Final   Serratia marcescens NOT DETECTED NOT DETECTED Final   Carbapenem resistance NOT DETECTED NOT DETECTED Final   Haemophilus influenzae NOT DETECTED NOT DETECTED Final   Neisseria meningitidis NOT DETECTED NOT DETECTED Final   Pseudomonas aeruginosa NOT DETECTED NOT DETECTED Final   Candida albicans NOT DETECTED NOT DETECTED Final   Candida glabrata NOT DETECTED NOT DETECTED Final   Candida krusei NOT DETECTED NOT DETECTED Final   Candida parapsilosis NOT DETECTED NOT DETECTED Final   Candida tropicalis NOT DETECTED NOT DETECTED Final    Comment: Performed at [facility], [place] [address], [place], [place] [address]  Respiratory Panel by  RT PCR (Flu A&B, Covid) - Nasopharyngeal Swab     Status: None   Collection Time: 06/19/19 12:40 AM   Specimen: Nasopharyngeal Swab  Result Value Ref Range Status   SARS Coronavirus 2 by RT PCR NEGATIVE NEGATIVE Final    Comment: (NOTE) SARS-CoV-2 target nucleic acids are NOT DETECTED. The SARS-CoV-2 RNA is generally detectable in upper respiratoy specimens during the acute phase of infection. The lowest concentration of SARS-CoV-2 viral copies this assay can detect is 131 copies/mL. A negative result does not preclude SARS-Cov-2 infection and should not be used as the sole basis for treatment or other patient management decisions. A negative result may occur with  improper specimen collection/handling, submission of specimen other than nasopharyngeal swab, presence of viral mutation(s) within the areas targeted by this assay, and inadequate number of viral copies (<131 copies/mL). A negative result must be combined with clinical observations, patient history, and epidemiological information. The expected result is Negative. Fact Sheet for Patients:  PinkCheek.be Fact Sheet for Healthcare Providers:  GravelBags.it This test is not yet ap proved or cleared by the [place] FDA and  has been authorized for detection and/or diagnosis of SARS-CoV-2 by FDA under an Emergency Use Authorization (EUA). This EUA will remain  in effect (meaning this test can be used) for the duration of the COVID-19 declaration under Section 564(b)(1) of the Act, 21 U.S.C. section 360bbb-3(b)(1), unless the authorization is terminated or revoked sooner.    Influenza A by PCR NEGATIVE NEGATIVE Final   Influenza B by PCR NEGATIVE NEGATIVE Final    Comment: (NOTE) The Xpert Xpress SARS-CoV-2/FLU/RSV assay is intended as an aid in  the diagnosis of influenza from Nasopharyngeal swab specimens and  should not be used as a sole basis for treatment.  Nasal washings and  aspirates are unacceptable for Xpert Xpress SARS-CoV-2/FLU/RSV  testing. Fact Sheet for Patients: PinkCheek.be Fact Sheet for Healthcare Providers: GravelBags.it This test is not yet approved or cleared by the [place] FDA and  has been authorized for detection and/or diagnosis of SARS-CoV-2 by  FDA under an Emergency Use Authorization (EUA). This EUA will remain  in effect (meaning this test can be used) for the duration of the  Covid-19 declaration under Section 564(b)(1) of the Act, 21  U.S.C. section 360bbb-3(b)(1), unless the authorization is  terminated  or revoked. Performed at [facility], [place] [address] [place], [place] [address]   MRSA PCR Screening     Status: None   Collection Time: 06/19/19  5:49 AM   Specimen: Nasal Mucosa; Nasopharyngeal  Result Value Ref Range Status   MRSA by PCR NEGATIVE NEGATIVE Final    Comment:        The GeneXpert MRSA Assay (FDA approved for NASAL specimens only), is one component of a comprehensive MRSA colonization surveillance program. It is not intended to diagnose MRSA infection nor to guide or monitor treatment for MRSA infections. Performed at [facility], [place] [address], [place], [place] [address]   Urine culture     Status: Abnormal   Collection Time: 06/20/19  9:18 AM   Specimen: In/Out Cath Urine  Result Value Ref Range Status   Specimen Description IN/OUT CATH URINE  Final   Special Requests   Final    NONE Performed at [facility], [place] [address] [place], [place] [address]    Culture >=100,000 COLONIES/mL CITROBACTER KOSERI (A)  Final   Report Status 06/22/2019 FINAL  Final   Organism ID, Bacteria CITROBACTER KOSERI (A)  Final      Susceptibility   Citrobacter koseri - MIC*    CEFAZOLIN <=4 SENSITIVE Sensitive     CEFTRIAXONE <=0.25 SENSITIVE Sensitive     CIPROFLOXACIN <=0.25 SENSITIVE Sensitive     GENTAMICIN  <=1 SENSITIVE Sensitive     IMIPENEM <=0.25 SENSITIVE Sensitive     NITROFURANTOIN <=16 SENSITIVE Sensitive     TRIMETH/SULFA <=20 SENSITIVE Sensitive     PIP/TAZO <=4 SENSITIVE Sensitive     * >=100,000 COLONIES/mL CITROBACTER KOSERI    Radiology Reports CT Head Wo Contrast  Result Date: 06/19/2019 CLINICAL DATA:  Altered mental status, unknown etiology. EXAM: CT HEAD WITHOUT CONTRAST TECHNIQUE: Contiguous axial images were obtained from the base of the skull through the vertex without intravenous contrast. COMPARISON:  None. FINDINGS: Brain: Generalized age related brain volume loss. Chronic small-vessel ischemic changes of the cerebral hemispheric white matter. No sign of acute large vessel territory infarction. No mass lesion, hemorrhage, hydrocephalus or extra-axial collection. 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 Skull: Normal Sinuses/Orbits: Mucosal thickening and a small amount of fluid in the left maxillary sinus, probably secondary to dental incursion. Other: None IMPRESSION: No acute intracranial finding. Atrophy. Chronic small-vessel ischemic changes. 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 Electronically Signed   By: Nelson Chimes M.D.   On: 06/19/2019 20:43   CT ANGIO CHEST PE W OR WO CONTRAST  Result Date: 06/19/2019 CLINICAL DATA:  Acute abdominal pain. History of iliac artery repair. Dyspnea. EXAM: CT ANGIOGRAPHY CHEST CT ABDOMEN AND PELVIS WITH CONTRAST TECHNIQUE: Multidetector CT imaging of the chest was performed using the standard protocol during bolus administration of intravenous contrast. Multiplanar CT image reconstructions and MIPs were obtained to evaluate the vascular anatomy. Multidetector CT imaging of the abdomen  and pelvis was performed using the standard protocol during bolus administration of intravenous contrast. CONTRAST:  158mL OMNIPAQUE IOHEXOL 350 MG/ML SOLN COMPARISON:  05/24/2019 FINDINGS: CTA CHEST FINDINGS Cardiovascular: Contrast injection is sufficient to demonstrate satisfactory opacification of the pulmonary arteries to the segmental level. There is  no pulmonary embolus. The main pulmonary artery is dilated measuring approximately 4 cm in diameter. Heart size is enlarged. There is no significant pericardial effusion. Coronary artery calcifications are noted. There are atherosclerotic changes of the thoracic aorta without evidence for an aneurysm. There is a left IJ central venous catheter with tip terminating in the SVC. Mediastinum/Nodes: --No mediastinal or hilar lymphadenopathy. --No axillary lymphadenopathy. --No supraclavicular lymphadenopathy. --Normal thyroid gland. --The esophagus is unremarkable Lungs/Pleura: There are small bilateral pleural effusions. There is adjacent atelectasis. There is no pneumothorax. Musculoskeletal: No chest wall abnormality. No acute or significant osseous findings. Review of the MIP images confirms the above findings. CT ABDOMEN and PELVIS FINDINGS Hepatobiliary: Again noted are findings of cirrhosis. There is cholelithiasis with mild gallbladder wall thickening and pericholecystic free fluid.There is no biliary ductal dilation. Pancreas: Normal contours without ductal dilatation. No peripancreatic fluid collection. Spleen: There are new wedge-shaped defects involving the spleen measuring up to approximately 3 cm. Adrenals/Urinary Tract: --Adrenal glands: No adrenal hemorrhage. --Right kidney/ureter: The right kidney is atrophic without evidence for hydronephrosis. --Left kidney/ureter: Again identified is a hyperdense lesion arising from the posterior interpolar region of the left kidney measuring approximately 1.2 cm. There is no left-sided hydronephrosis. --Urinary  bladder: The urinary bladder is significantly distended. Stomach/Bowel: --Stomach/Duodenum: No hiatal hernia or other gastric abnormality. Normal duodenal course and caliber. --Small bowel: No dilatation or inflammation. --Colon: No focal abnormality. --Appendix: Normal. 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 This is essentially stable since 07/02/2017. There is no clear stenosis at the origin of the celiac axis. The splenic artery appears to be grossly patent. 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 The graft is grossly patent. The aneurysm surrounding the left common iliac artery is stable measuring approximately 3.8 cm (axial series 5, image 49), previously measuring 3.8 cm on November 09, 2018. Embolization coils are noted in the left internal iliac artery. There is a partially visualized right AV fistula that appears grossly patent. --No retroperitoneal lymphadenopathy. --No mesenteric lymphadenopathy. --No pelvic or inguinal lymphadenopathy. Reproductive: Prostate gland is mildly enlarged. Other: There is mild diffuse body wall edema. There is a trace amount of free fluid in the patient's abdomen and pelvis. Musculoskeletal. Again noted are findings of renal osteodystrophy. There is no displaced fracture. Review of the MIP images confirms the above findings. IMPRESSION: 1. No acute pulmonary embolism. 2. Small bilateral pleural effusions with adjacent atelectasis. 3. Dilated main pulmonary artery, which may be secondary to pulmonary arterial hypertension. 4. Interval development of wedge-shaped defects involving the spleen, which may represent splenic infarcts. 5. Cholelithiasis with mild gallbladder wall thickening and pericholecystic free fluid,  which may be secondary to underlying liver disease. If there is clinical concern for acute cholecystitis, follow-up with ultrasound is recommended. 6. 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 7. 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 8. Additional chronic findings as detailed above. 9. Aortic Atherosclerosis (ICD10-I70.0). Electronically Signed   By: Constance Holster M.D.   On: 06/19/2019 21:02   CT PELVIS W CONTRAST  Result Date: 06/21/2019 CLINICAL DATA:  Vesicointestinal fistula.  Rectal contrast. EXAM: CT PELVIS WITH CONTRAST TECHNIQUE: Multidetector CT imaging of the pelvis was performed using the standard protocol  following the bolus administration of intravenous contrast. CONTRAST:  134mL OMNIPAQUE IOHEXOL 300 MG/ML  SOLN COMPARISON:  Abdomen and pelvis CT from 2 days ago FINDINGS: Urinary Tract: No contrast is seen in the urinary bladder which is diffusely thick walled with reticulated appearance. Bowel: Rectal contrast was administered. There is mesorectal fat stranding which was also seen on prior. The underlying wall is not thickened and this may be from total-body volume overload. Vascular/Lymphatic: Aorto bi-iliac stenting with left hypogastric artery embolization. Where covered there is no visible aneurysmal sac enhancement. No acute vascular finding. Reproductive:  Enlarged prostate projecting into the bladder base. Other:  Prominent anasarca. Musculoskeletal: Spondylosis with bulky endplate spurring. Sacroiliac ankylosis with spurring. IMPRESSION: 1. Negative for colovesicular fistula. Rectal contrast only opacifies the colon. 2. Bladder wall thickening likely from chronic outlet obstruction. The prostate is enlarged. 3. Prominent anasarca. Electronically Signed   By: Monte Fantasia M.D.   On: 06/21/2019 10:46   CT ABDOMEN PELVIS W CONTRAST  Result Date: 06/19/2019 CLINICAL DATA:   Acute abdominal pain. History of iliac artery repair. Dyspnea. EXAM: CT ANGIOGRAPHY CHEST CT ABDOMEN AND PELVIS WITH CONTRAST TECHNIQUE: Multidetector CT imaging of the chest was performed using the standard protocol during bolus administration of intravenous contrast. Multiplanar CT image reconstructions and MIPs were obtained to evaluate the vascular anatomy. Multidetector CT imaging of the abdomen and pelvis was performed using the standard protocol during bolus administration of intravenous contrast. CONTRAST:  156mL OMNIPAQUE IOHEXOL 350 MG/ML SOLN COMPARISON:  05/24/2019 FINDINGS: CTA CHEST FINDINGS Cardiovascular: Contrast injection is sufficient to demonstrate satisfactory opacification of the pulmonary arteries to the segmental level. There is no pulmonary embolus. The main pulmonary artery is dilated measuring approximately 4 cm in diameter. Heart size is enlarged. There is no significant pericardial effusion. Coronary artery calcifications are noted. There are atherosclerotic changes of the thoracic aorta without evidence for an aneurysm. There is a left IJ central venous catheter with tip terminating in the SVC. Mediastinum/Nodes: --No mediastinal or hilar lymphadenopathy. --No axillary lymphadenopathy. --No supraclavicular lymphadenopathy. --Normal thyroid gland. --The esophagus is unremarkable Lungs/Pleura: There are small bilateral pleural effusions. There is adjacent atelectasis. There is no pneumothorax. Musculoskeletal: No chest wall abnormality. No acute or significant osseous findings. Review of the MIP images confirms the above findings. CT ABDOMEN and PELVIS FINDINGS Hepatobiliary: Again noted are findings of cirrhosis. There is cholelithiasis with mild gallbladder wall thickening and pericholecystic free fluid.There is no biliary ductal dilation. Pancreas: Normal contours without ductal dilatation. No peripancreatic fluid collection. Spleen: There are new wedge-shaped defects involving the  spleen measuring up to approximately 3 cm. Adrenals/Urinary Tract: --Adrenal glands: No adrenal hemorrhage. --Right kidney/ureter: The right kidney is atrophic without evidence for hydronephrosis. --Left kidney/ureter: Again identified is a hyperdense lesion arising from the posterior interpolar region of the left kidney measuring approximately 1.2 cm. There is no left-sided hydronephrosis. --Urinary bladder: The urinary bladder is significantly distended. Stomach/Bowel: --Stomach/Duodenum: No hiatal hernia or other gastric abnormality. Normal duodenal course and caliber. --Small bowel: No dilatation or inflammation. --Colon: No focal abnormality. --Appendix: Normal. 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 This is essentially stable since 07/02/2017. There is no clear stenosis at the origin of the celiac axis. The splenic artery appears to be grossly patent. 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 The patient is status post prior endovascular repair of bilateral  common iliac artery aneurysms. The graft is grossly patent. The aneurysm surrounding the left common iliac artery is stable measuring approximately 3.8 cm (axial series 5, image 49), previously measuring 3.8 cm on November 09, 2018. Embolization coils are noted in the left internal iliac artery. There is a partially visualized right AV fistula that appears grossly patent. --No retroperitoneal lymphadenopathy. --No mesenteric lymphadenopathy. --No pelvic or inguinal lymphadenopathy. Reproductive: Prostate gland is mildly enlarged. Other: There is mild diffuse body wall edema. There is a trace amount of free fluid in the patient's abdomen and pelvis. Musculoskeletal. Again noted are findings of renal osteodystrophy. There is no displaced fracture. Review of the MIP images confirms the above  findings. IMPRESSION: 1. No acute pulmonary embolism. 2. Small bilateral pleural effusions with adjacent atelectasis. 3. Dilated main pulmonary artery, which may be secondary to pulmonary arterial hypertension. 4. Interval development of wedge-shaped defects involving the spleen, which may represent splenic infarcts. 5. Cholelithiasis with mild gallbladder wall thickening and pericholecystic free fluid, which may be secondary to underlying liver disease. If there is clinical concern for acute cholecystitis, follow-up with ultrasound is recommended. 6. 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 7. 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 8. Additional chronic findings as detailed above. 9. Aortic Atherosclerosis (ICD10-I70.0). Electronically Signed   By: Constance Holster M.D.   On: 06/19/2019 21:02   DG Chest Port 1 View  Result Date: 06/25/2019 CLINICAL DATA:  Abnormal respiration EXAM: PORTABLE CHEST 1 VIEW COMPARISON:  06/19/2019 chest radiograph. FINDINGS: Left internal jugular central venous catheter terminates at the cavoatrial junction. Stable cardiomediastinal silhouette with mild cardiomegaly. No pneumothorax. Stable small left and trace right pleural effusions. Borderline mild pulmonary edema, improved. Bibasilar atelectasis, left greater than right, unchanged. Partially visualized abdominal aortic stent graft. IMPRESSION: 1. Borderline mild congestive heart failure, improved. 2. Stable small left and trace right pleural effusions with bibasilar atelectasis. Electronically Signed   By: Ilona Sorrel M.D.   On: 06/25/2019 05:32   DG CHEST PORT 1 VIEW  Result Date: 06/19/2019 CLINICAL DATA:  Status post central line placement EXAM: PORTABLE CHEST 1 VIEW COMPARISON:  Film from earlier in the same day. FINDINGS: Cardiac shadow is enlarged but stable. New left jugular central line is noted at the  cavoatrial junction. Vascular congestion is noted with increasing pulmonary edema. No focal confluent infiltrate is seen. No bony abnormality is noted. No pneumothorax is noted. IMPRESSION: Vascular congestion with increasing edema. No pneumothorax following central line placement. Electronically Signed   By: Inez Catalina M.D.   On: 06/19/2019 17:20   DG Chest Port 1 View  Result Date: 06/19/2019 CLINICAL DATA:  Fever.  Lethargic.  Dialysis patient. EXAM: PORTABLE CHEST 1 VIEW COMPARISON:  07/27/2017 FINDINGS: Chronic cardiomegaly and aortic atherosclerosis. 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 Chronic elevation of the left hemidiaphragm with chronic left base volume loss. No acute bone finding. Previous surgical clips at the thoracic inlet. IMPRESSION: Cardiomegaly.  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 Chronic elevation of the left hemidiaphragm with chronic volume loss at the left lung base. Electronically Signed   By: Nelson Chimes M.D.   On: 06/19/2019 00:38   ECHOCARDIOGRAM COMPLETE  Result Date: 06/19/2019    ECHOCARDIOGRAM REPORT   Patient Name:   TYWAN SIEVER Date of Exam: 06/19/2019 Medical Rec #:  [id number]       Height:       76.0 in  Accession #:    [id number]      Weight:       160.5 lb Date of Birth:  [birth year]        BSA:          2.02 m Patient Age:    77 years        BP:           67/45 mmHg Patient Gender: M               HR:           95 bpm. Exam Location:  Inpatient Procedure: 2D Echo, Cardiac Doppler and Color Doppler Indications:    Hypotension  History:        Patient has prior history of Echocardiogram examinations, most                 recent 05/15/2016. Risk Factors:Hypertension and Non-Smoker.  Sonographer:    Vickie Epley RDCS Referring Phys: [id number] [place]  1. Left ventricular ejection fraction, by estimation, is 60 to 65%. The left ventricle has normal function. The left ventricle has no regional wall motion abnormalities. Left  ventricular diastolic parameters are indeterminate.  2. Right ventricular systolic function is normal. The right ventricular size is normal.  3. Left atrial size was severely dilated.  4. Right atrial size was mildly dilated.  5. The mitral valve is degenerative. Mild mitral valve regurgitation. No evidence of mitral stenosis.  6. The aortic valve is tricuspid. Aortic valve regurgitation is mild to moderate. Mild aortic valve stenosis.  7. The inferior vena cava is normal in size with greater than 50% respiratory variability, suggesting right atrial pressure of 3 mmHg. FINDINGS  Left Ventricle: Left ventricular ejection fraction, by estimation, is 60 to 65%. The left ventricle has normal function. The left ventricle has no regional wall motion abnormalities. The left ventricular internal cavity size was normal in size. There is  no left ventricular hypertrophy. Left ventricular diastolic parameters are indeterminate. Right Ventricle: The right ventricular size is normal. No increase in right ventricular wall thickness. Right ventricular systolic function is normal. Left Atrium: Left atrial size was severely dilated. Right Atrium: Right atrial size was mildly dilated. Pericardium: There is no evidence of pericardial effusion. Mitral Valve: The mitral valve is degenerative in appearance. There is moderate thickening of the mitral valve leaflet(s). There is moderate calcification of the mitral valve leaflet(s). Normal mobility of the mitral valve leaflets. Severe mitral annular  calcification. Mild mitral valve regurgitation. No evidence of mitral valve stenosis. Tricuspid Valve: The tricuspid valve is normal in structure. Tricuspid valve regurgitation is trivial. No evidence of tricuspid stenosis. Aortic Valve: The aortic valve is tricuspid. . There is severe thickening and severe calcifcation of the aortic valve. Aortic valve regurgitation is mild to moderate. Aortic regurgitation PHT measures 201 msec. Mild aortic  stenosis is present. Severe aortic valve annular calcification. There is severe thickening of the aortic valve. There is severe calcifcation of the aortic valve. Aortic valve mean gradient measures 12.3 mmHg. Aortic valve peak gradient measures 19.7 mmHg. Aortic valve area, by VTI measures 2.13 cm. Pulmonic Valve: The pulmonic valve was normal in structure. Pulmonic valve regurgitation is mild. No evidence of pulmonic stenosis. Aorta: The aortic root is normal in size and structure. Venous: The inferior vena cava is normal in size with greater than 50% respiratory variability, suggesting right atrial pressure of 3 mmHg. IAS/Shunts: No atrial level shunt detected by color flow Doppler.  LEFT VENTRICLE PLAX 2D LVIDd:         5.60 cm      Diastology LVIDs:         4.20 cm      LV e' lateral:   7.62 cm/s LV PW:         0.60 cm      LV E/e' lateral: 21.8 LV IVS:        0.60 cm      LV e' medial:    7.29 cm/s LVOT diam:     2.40 cm      LV E/e' medial:  22.8 LV SV:         99.98 ml LV SV Index:   38.29 LVOT Area:     4.52 cm  LV Volumes (MOD) LV vol d, MOD A2C: 148.0 ml LV vol d, MOD A4C: 140.0 ml LV vol s, MOD A2C: 74.7 ml LV vol s, MOD A4C: 71.9 ml LV SV MOD A2C:     73.3 ml LV SV MOD A4C:     140.0 ml LV SV MOD BP:      72.3 ml RIGHT VENTRICLE RV S prime:     15.70 cm/s TAPSE (M-mode): 1.9 cm LEFT ATRIUM              Index       RIGHT ATRIUM           Index LA diam:        5.40 cm  2.68 cm/m  RA Area:     26.60 cm LA Vol (A2C):   164.0 ml 81.29 ml/m RA Volume:   82.00 ml  40.65 ml/m LA Vol (A4C):   156.0 ml 77.33 ml/m LA Biplane Vol: 165.0 ml 81.79 ml/m  AORTIC VALVE AV Area (Vmax):    1.91 cm AV Area (Vmean):   1.81 cm AV Area (VTI):     2.13 cm AV Vmax:           222.00 cm/s AV Vmean:          166.667 cm/s AV VTI:            0.469 m AV Peak Grad:      19.7 mmHg AV Mean Grad:      12.3 mmHg LVOT Vmax:         93.80 cm/s LVOT Vmean:        66.700 cm/s LVOT VTI:          0.221 m LVOT/AV VTI ratio: 0.47 AI  PHT:            201 msec  AORTA Ao Root diam: 3.40 cm MITRAL VALVE MV Area (PHT): 6.27 cm     SHUNTS MV Decel Time: 121 msec     Systemic VTI:  0.22 m MV E velocity: 166.00 cm/s  Systemic Diam: 2.40 cm MV A velocity: 49.40 cm/s MV E/A ratio:  3.36 Jenkins Rouge MD Electronically signed by Jenkins Rouge MD Signature Date/Time: 06/19/2019/11:06:09 AM    Final    US Abdomen Limited RUQ  Result Date: 06/20/2019 CLINICAL DATA:  69 year old male with abdominal pain, abnormal CT appearance of the gallbladder yesterday. EXAM: ULTRASOUND ABDOMEN LIMITED RIGHT UPPER QUADRANT COMPARISON:  CTA chest, abdomen and pelvis 06/19/2019. FINDINGS: Gallbladder: The gallbladder appears similarly distended, with sludge in the lumen (image 5). No shadowing echogenic stones identified. There is non concentric appearing gallbladder wall thickening up to 6 mm (image 7). No pericholecystic fluid is evident. No sonographic Percell Miller  sign was elicited. Common bile duct: Diameter: 7-8 mm, upper limits of normal to mildly dilated. The CBD is visible to the pancreatic head (image 19) with no obvious filling defect. Liver: No intrahepatic ductal dilatation. Liver echogenicity within normal limits. No discrete liver lesion. Portal vein is patent on color Doppler imaging with normal direction of blood flow towards the liver. Other: Atrophic right kidney better demonstrated by CT. IMPRESSION: 1. Both the gallbladder and the CBD appear mildly distended with sludge but no stone identified. No intrahepatic biliary ductal dilatation to strongly suggest acute bile duct obstruction. Some gallbladder wall thickening, although no sonographic Murphy sign elicited to strongly suggest acalculous cholecystitis. 2. If abdominal pain continues then a nuclear medicine hepatobiliary scan would be valuable to exclude acute CBD or cystic duct obstruction. Electronically Signed   By: Genevie Ann M.D.   On: 06/20/2019 23:50

## 2019-06-29 DIAGNOSIS — N3080 Other cystitis without hematuria: Secondary | ICD-10-CM

## 2019-06-29 LAB — CBC WITH DIFFERENTIAL/PLATELET
Abs Immature Granulocytes: 0.36 10*3/uL — ABNORMAL HIGH (ref 0.00–0.07)
Basophils Absolute: 0 10*3/uL (ref 0.0–0.1)
Basophils Relative: 0 %
Eosinophils Absolute: 0 10*3/uL (ref 0.0–0.5)
Eosinophils Relative: 0 %
HCT: 25.9 % — ABNORMAL LOW (ref 39.0–52.0)
Hemoglobin: 8.8 g/dL — ABNORMAL LOW (ref 13.0–17.0)
Immature Granulocytes: 2 %
Lymphocytes Relative: 1 %
Lymphs Abs: 0.3 10*3/uL — ABNORMAL LOW (ref 0.7–4.0)
MCH: 30.1 pg (ref 26.0–34.0)
MCHC: 34 g/dL (ref 30.0–36.0)
MCV: 88.7 fL (ref 80.0–100.0)
Monocytes Absolute: 1 10*3/uL (ref 0.1–1.0)
Monocytes Relative: 5 %
Neutro Abs: 19.9 10*3/uL — ABNORMAL HIGH (ref 1.7–7.7)
Neutrophils Relative %: 92 %
Platelets: 23 10*3/uL — CL (ref 150–400)
RBC: 2.92 MIL/uL — ABNORMAL LOW (ref 4.22–5.81)
RDW: 17.9 % — ABNORMAL HIGH (ref 11.5–15.5)
WBC: 21.6 10*3/uL — ABNORMAL HIGH (ref 4.0–10.5)
nRBC: 0 % (ref 0.0–0.2)

## 2019-06-29 LAB — COMPREHENSIVE METABOLIC PANEL
ALT: 29 U/L (ref 0–44)
AST: 25 U/L (ref 15–41)
Albumin: 2.4 g/dL — ABNORMAL LOW (ref 3.5–5.0)
Alkaline Phosphatase: 79 U/L (ref 38–126)
Anion gap: 13 (ref 5–15)
BUN: 66 mg/dL — ABNORMAL HIGH (ref 8–23)
CO2: 23 mmol/L (ref 22–32)
Calcium: 6.3 mg/dL — CL (ref 8.9–10.3)
Chloride: 103 mmol/L (ref 98–111)
Creatinine, Ser: 4.26 mg/dL — ABNORMAL HIGH (ref 0.61–1.24)
GFR calc Af Amer: 15 mL/min — ABNORMAL LOW (ref >60)
GFR calc non Af Amer: 13 mL/min — ABNORMAL LOW (ref >60)
Glucose, Bld: 96 mg/dL (ref 70–99)
Potassium: 4.4 mmol/L (ref 3.5–5.1)
Sodium: 139 mmol/L (ref 135–145)
Total Bilirubin: 0.6 mg/dL (ref 0.3–1.2)
Total Protein: 4.4 g/dL — ABNORMAL LOW (ref 6.5–8.1)

## 2019-06-29 LAB — MAGNESIUM: Magnesium: 2.2 mg/dL (ref 1.7–2.4)

## 2019-06-29 LAB — GLUCOSE, CAPILLARY
Glucose-Capillary: 66 mg/dL — ABNORMAL LOW (ref 70–99)
Glucose-Capillary: 72 mg/dL (ref 70–99)
Glucose-Capillary: 79 mg/dL (ref 70–99)
Glucose-Capillary: 80 mg/dL (ref 70–99)
Glucose-Capillary: 85 mg/dL (ref 70–99)
Glucose-Capillary: 86 mg/dL (ref 70–99)
Glucose-Capillary: 90 mg/dL (ref 70–99)
Glucose-Capillary: 91 mg/dL (ref 70–99)

## 2019-06-29 LAB — BRAIN NATRIURETIC PEPTIDE: B Natriuretic Peptide: 2618.2 pg/mL — ABNORMAL HIGH (ref 0.0–100.0)

## 2019-06-29 LAB — PREPARE PLATELET PHERESIS: Unit division: 0

## 2019-06-29 LAB — D-DIMER, QUANTITATIVE: D-Dimer, Quant: 13.37 ug/mL-FEU — ABNORMAL HIGH (ref 0.00–0.50)

## 2019-06-29 LAB — C-REACTIVE PROTEIN: CRP: 4.5 mg/dL — ABNORMAL HIGH (ref <1.0)

## 2019-06-29 LAB — BPAM PLATELET PHERESIS
Blood Product Expiration Date: 202102252359
ISSUE DATE / TIME: 202102231517
Unit Type and Rh: 5100

## 2019-06-29 LAB — PROCALCITONIN: Procalcitonin: 9.37 ng/mL

## 2019-06-29 LAB — PHOSPHORUS: Phosphorus: 4 mg/dL (ref 2.5–4.6)

## 2019-06-29 LAB — TSH: TSH: 2.534 u[IU]/mL (ref 0.350–4.500)

## 2019-06-29 MED ORDER — LOPERAMIDE HCL 2 MG PO CAPS
2.0000 mg | ORAL_CAPSULE | ORAL | Status: DC | PRN
  Filled 2019-06-29 (×2): qty 1

## 2019-06-29 MED ORDER — DEXTROSE 5 % IV SOLN: INTRAVENOUS | Status: DC

## 2019-06-29 MED ORDER — ALBUMIN HUMAN 25 % IV SOLN: 25.0000 g | Freq: Once | INTRAVENOUS | Status: AC

## 2019-06-29 MED ORDER — ALBUMIN HUMAN 25 % IV SOLN
INTRAVENOUS | Status: AC
  Administered 2019-06-29: 25 g via INTRAVENOUS
  Filled 2019-06-29: qty 100

## 2019-06-29 NOTE — Progress Notes (Signed)
Pt. sbp 76, Gregory Stovall PA made aware orders for albumin as documented. Pt. Stable

## 2019-06-29 NOTE — Progress Notes (Signed)
Pt's left IV infiltrated, RN removed IV, elevated pt's arm and applied heating pad. IV team consulted. 2 unsuccessful attempts from IV team.  Updated oncoming RN

## 2019-06-29 NOTE — Progress Notes (Signed)
  Speech Language Pathology Treatment: Dysphagia  Patient Details Name: Gregory [name] MRN: [id number] DOB: 1950/05/17 Today's Date: 06/29/2019 Time: 9935-7017 SLP Time Calculation (min) (ACUTE ONLY): 10 min  Assessment / Plan / Recommendation Clinical Impression  Pt was seen for dysphagia treatment with his wife present. Pt has been followed by speech pathology since 06/19/19 and, per medical record, his p.o. intake has been inadequate. Nursing added that the pt has also been refusing medication. Cortrak placement for supplementation is currently pending due to pt's platelet level. Pt reported that he has not wanted to eat because he is "just not hungry"; however, he was amenable to consuming trials from the SLP. He consumed regular consistency solids with a mildly increased mastication time. No significant oral residue was noted even with larger boluses. He tolerated consecutive swallows of thin liquids without overt s/sx of aspiration. It is recommended that the pt's diet be upgraded to dysphagia 3 solids and that thin liquids be continued. SLP will follow pt to ensure diet tolerance and monitor closely for pocketing as this has been demonstrated during a prior session.    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  Work-up suggest sepsis without clear source.  CXR on 2/14 reported: "Cardiomegaly.  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 CXR 2/20: Borderline mild congestive heart failure, improved      SLP Plan  Continue with current plan of care       Recommendations  Diet recommendations: Dysphagia 3 (mechanical soft);Thin liquid Liquids provided via: Straw Medication Administration:  Crushed with puree Supervision: Patient able to self feed;Staff to assist with self feeding;Full supervision/cueing for compensatory strategies Compensations: Small sips/bites;Follow solids with liquid Postural Changes and/or Swallow Maneuvers: Seated upright 90 degrees                Oral Care Recommendations: Oral care BID Follow up Recommendations: 24 hour supervision/assistance;Skilled Nursing facility SLP Visit Diagnosis: Dysphagia, oral phase (R13.11) Plan: Continue with current plan of care       Chayton Murata I. Hardin Negus, [place], [place] Office number [phone] Pager [place] 06/29/2019, 4:02 PM

## 2019-06-29 NOTE — Progress Notes (Signed)
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  Was diagnosed with septic shock with severe thrombocytopenia, source was bladder infection.  He was seen by urology, nephrology, hematology.  PCCM was the primary service.  He was transferred to hospitalist service on 06/21/2019 on day 9 of his hospital stay.  Still extremely frail and tenuous and I have now called palliative care for further goals of care discussion.  PT OT recommending CIR.  Assessment & Plan:   Active Problems:   End-stage renal disease on hemodialysis (HCC)   Septic shock (HCC)   Protein-calorie malnutrition, severe   Thrombocytopenia (HCC)  Septic shock from Pyocystitis - Citrobacter bacteremia, causing severe thrombocytopenia. -Based on culture and sensitivity data he is currently on Ancef, mildly improved but overall still septic, will trend CRP and procalcitonin, case discussed with urologist Dr. Tresa Moore and nephrologist Dr. Jonnie Finner, his bladder pus was drained with Foley catheter and does not require a indwelling Foley and Dr. Tresa Moore recommends stopping I and O Cath's.   -Continue antibiotics with IV Cefazolin   -Continue to monitor closely.  Overall extremely tenuous   -Called Palliative Care Medicine for [place] Consultation  -WBC went from 19.8-24.6 and is now 21.6 -Procalcitonin level is 10.63 and went down to 9.37 -CRP is trending upward from 3.2-4.5 -Was on IV Solu-Cortef which I will continue for another 3 days at low-dose at 50 mg Daily -Continue to Monitor CBC and Temperature Curve -Nephrology reordered INO cath to check for recurrent  pus in the bladder but urology recommended against this  Severe Thrombocytopenia  -Likely due to septic shock and bone marrow suppression, seen by hematologist this admission,  -Platelet count fell below 9000 and he was transfused 1 unit of platelets.   -Platelet Count went to 27,000 and today is 23,000  -Continue to Monitor for S/Sx of Bleeding -Repeat CBC in AM  Elevated D-Dimer -D-dimer 13.37 -CTA PE showed no acute PE but did show small bilateral pleural effusions with adjacent atelectasis and a dilated main pulmonary artery which is secondary to pulmonary arterial hypertension there is interval development of splenic infarcts  BPH -On Flomax.  Hardly makes any urine anymore. -Stop I and O Cath's per my Discussion with Urology Dr. Tresa Moore   ESRD with hypotension -Dialysis per schedule, nephrology on board. -BUN/Cr went from 49/3.30 -> 66/4.26 -Patient was Dialyzed today; patient was given IV albumin and kept off of ultrafiltration for now -Continue to Monitor and Trend Renal Fxn  Dysphagia -On dysphagia 2 diet but now is back on a dysphagia 3 diet with thin liquids.  Speech following.  Avoid core track or NG tube due to severe thrombocytopenia. -Started Low dose IVF   New onset A. fib RVR/a flutter -Mali vas 2 score of at least 3.  -Currently on amnio drip for rate control, currently not a candidate for anticoagulation due to severe thrombocytopenia.  EchoCardiogram stable.   -Checked TSH was 2.534.   -If rate control becomes an issue cardiology will be consulted. Will try 2 doses of IV digoxin.  History of celiac and superior mesenteric artery aneurysms.   -Outpatient follow-up with vascular surgery. -CTA  showed stable appearance of the aneurysmal dilatation of the proximal celiac axis and patient is status post endovascular repair of the bilateral common iliac artery aneurysms with stable appearance of the hardware  Severe Deconditioning.  -C/w PT OT.  May require placement  in the recommending CIR -Palliative care consulted for further evaluation recommendations  Severe Protein Calorie Malnutrition -Placed on prostat. -Nutritionist consulted and recommending continue to monitor magnesium, potassium and phosphorus daily for least 3 days and continue Nepro shakes p.o. 3 times daily -They recommend a small bore feeding tube when platelet levels are higher with core track service available Tuesday for the next-when he is safely able to place a small bore feeding tube they recommend considering Nepro 1.8 cal at 20 mL/h and increase to 10 mL/h every 8 hours until goal rate of 50 mL/h next-they recommend 30 mL of prostate 3 times daily -Because patient not taking in much we will start gentle IV fluid hydration with D5W at 50 MLS per hour   DVT prophylaxis: SCDs Code Status: FULL CODE  Family Communication: No family present at bedside  Disposition Plan: Pending clinical improvement and if patient is clinically improved significantly then he can be discharged to CIR but will need goals of care discussion prior to this and will need to see if his platelet counts recover to see if he can take more p.o. intake.  Consultants:   Nephrology  Urology  PCCM Transfer    Procedures:  TTE   Echo 2/14  1. Left ventricular ejection fraction, by estimation, is 60 to 65%. The left ventricle has normal function. The left ventricle has no regional wall motion abnormalities. Left ventricular diastolic parameters are indeterminate.  2. Right ventricular systolic function is normal. The right ventricular size is normal.  3. Left atrial size was severely dilated.  4. Right atrial size was mildly dilated.  5. The mitral valve is degenerative. Mild mitral valve regurgitation. No evidence of mitral stenosis.  6. The aortic valve is tricuspid. Aortic valve regurgitation is mild to moderate. Mild aortic valve stenosis.  7. The inferior vena cava is normal in size with greater  than 50% respiratory variability, suggesting right atrial pressure of 3 mmHg.    RUQ [place] -    1. Both the gallbladder and the CBD appear mildly distended with sludge but no stone identified. No intrahepatic biliary ductal dilatation to strongly suggest acute bile duct obstruction. Some gallbladder wall thickening, although no sonographic Murphy sign elicited to strongly suggest acalculous cholecystitis.  2. If abdominal pain continues then a nuclear medicine hepatobiliary scan would be valuable to exclude acute CBD or cystic duct Obstruction.   CT Head - non acute  RUQ [place] - non acute  CT Chest - Abd - Pelvis -  1. No acute pulmonary embolism. 2. Small bilateral pleural effusions with adjacent atelectasis. 3. Dilated main pulmonary artery, which may be secondary to pulmonary arterial hypertension. 4. Interval development of wedge-shaped defects involving the spleen, which may represent splenic infarcts. 5. Cholelithiasis with mild gallbladder wall thickening and pericholecystic free fluid, which may be secondary to underlying liver disease. If there is clinical concern for acute cholecystitis, follow-up with ultrasound is recommended. 6. 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 7. 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 8. Additional chronic findings as detailed above. 9. Aortic Atherosclerosis.   Antimicrobials:  Anti-infectives (From admission, onward)   Start  Dose/Rate Route Frequency Ordered Stop   06/28/19 0930  ceFAZolin (ANCEF) IVPB 1 g/50 mL premix     1 g 100 mL/hr over 30 Minutes Intravenous Every 24 hours 06/28/19 0924 07/03/19 0929   06/21/19 1400  cefTRIAXone (ROCEPHIN) 2 g in sodium chloride 0.9 % 100 mL IVPB  Status:  Discontinued     2 g 200 mL/hr over 30 Minutes Intravenous Every 24 hours 06/21/19 1002 06/28/19 0924   06/20/19 1200  vancomycin (VANCOCIN) IVPB  1000 mg/200 mL premix  Status:  Discontinued     1,000 mg 200 mL/hr over 60 Minutes Intravenous Every M-W-F (Hemodialysis) 06/19/19 0232 06/20/19 1109   06/20/19 1200  meropenem (MERREM) 1 g in sodium chloride 0.9 % 100 mL IVPB  Status:  Discontinued     1 g 200 mL/hr over 30 Minutes Intravenous Every 8 hours 06/20/19 1112 06/21/19 1002   06/19/19 2200  ceFEPIme (MAXIPIME) 1 g in sodium chloride 0.9 % 100 mL IVPB  Status:  Discontinued     1 g 200 mL/hr over 30 Minutes Intravenous Every 24 hours 06/19/19 0232 06/20/19 1109   06/19/19 0230  vancomycin (VANCOREADY) IVPB 750 mg/150 mL     750 mg 150 mL/hr over 60 Minutes Intravenous  Once 06/19/19 0227 06/19/19 0435   07/01/2019 2330  ceFEPIme (MAXIPIME) 2 g in sodium chloride 0.9 % 100 mL IVPB     2 g 200 mL/hr over 30 Minutes Intravenous  Once 06/15/2019 2329 06/19/19 0052   06/12/2019 2330  metroNIDAZOLE (FLAGYL) IVPB 500 mg     500 mg 100 mL/hr over 60 Minutes Intravenous  Once 06/27/2019 2329 06/19/19 0205   06/22/2019 2330  vancomycin (VANCOCIN) IVPB 1000 mg/200 mL premix     1,000 mg 200 mL/hr over 60 Minutes Intravenous  Once 07/02/2019 2329 06/19/19 0205     Subjective: Seen and examined at bedside in dialysis and he was complaining of some pain.  No nausea or vomiting.  Does not feel as well.  No other concerns complaints at this time.  Objective: Vitals:   06/29/19 1100 06/29/19 1220 06/29/19 1238 06/29/19 1239  BP: (!) 95/42 (!) 101/38    Pulse: 92 86 88   Resp: 18 12 13 12   Temp: 97.9 F (36.6 C)     TempSrc: Oral     SpO2: 98% 100% 100%   Weight: 75.9 kg     Height:        Intake/Output Summary (Last 24 hours) at 06/29/2019 1759 Last data filed at 06/29/2019 1142 Gross per 24 hour  Intake 320.04 ml  Output 230 ml  Net 90.04 ml   Filed Weights   06/29/19 0420 06/29/19 0746 06/29/19 1100  Weight: 74.3 kg 75.9 kg 75.9 kg   Examination: Physical Exam:  Constitutional: Thin frail elderly AAM in NAD and appears calm but  uncomfortable in dialysis Eyes: Lids and conjunctivae normal, sclerae anicteric  ENMT: External Ears, Nose appear normal. Grossly normal hearing.  Neck: Appears normal, supple, no cervical masses, normal ROM, no appreciable thyromegaly; no JVD; Has a Left IJ CVC Respiratory: Diminished to auscultation bilaterally, no wheezing, rales, rhonchi or crackles. Normal respiratory effort and patient is not tachypenic. No accessory muscle use.  Cardiovascular: Irregularly Irregular, no murmurs / rubs / gallops. S1 and S2 auscultated. Mild extremity edema. Abdomen: Soft, mildly tender, non-distended. Bowel sounds positive.  GU: Deferred. Musculoskeletal: No clubbing / cyanosis of digits/nails. No joint deformity upper and lower extremities.  Skin: No rashes, lesions,  ulcers on a limited skin evaluation. No induration; Warm and dry.  Neurologic: CN 2-12 grossly intact with no focal deficits. Romberg sign cerebellar reflexes not assessed.  Psychiatric: Normal judgment and insight. Alert and oriented x 3. Anxious mood and appropriate affect.   Data Reviewed: I have personally reviewed following labs and imaging studies  CBC: Recent Labs  Lab 06/25/19 0457 06/25/19 0457 06/26/19 0506 06/27/19 0259 06/27/19 1623 06/28/19 1945 06/29/19 0428  WBC 16.4*  --  19.9* 19.8*  --  24.6* 21.6*  NEUTROABS  --   --   --   --   --  23.0* 19.9*  HGB 8.8*  --  10.5* 9.4*  --  9.0* 8.8*  HCT 26.2*   < > 31.0* 28.4* 26.1* 27.1* 25.9*  MCV 87.9  --  88.8 89.9  --  88.9 88.7  PLT 16*   < > 13* 11* 9* 27* 23*   < > = values in this interval not displayed.   Basic Metabolic Panel: Recent Labs  Lab 06/26/19 0506 06/26/19 1542 06/26/19 1902 06/27/19 0258 06/27/19 0259 06/28/19 0522 06/29/19 0428  NA 139 142  --  138  --  140 139  K 4.1 3.8  --  4.1  --  4.3 4.4  CL 104 99  --  103  --  103 103  CO2 27 25  --  25  --  25 23  GLUCOSE 127* 138*  --  115*  --  122* 96  BUN 16 17  --  28*  --  49* 66*   CREATININE 1.42* 1.38*  --  2.02*  --  3.30* 4.26*  CALCIUM 7.6* 6.8*  --  7.1*  --  6.6* 6.3*  MG 2.3  --  2.3  --  2.3 2.1 2.2  PHOS 1.8* 3.5  --  2.6  --  3.2 4.0   GFR: Estimated Creatinine Clearance: 17.3 mL/min (A) (by C-G formula based on SCr of 4.26 mg/dL (H)). Liver Function Tests: Recent Labs  Lab 06/26/19 0506 06/26/19 1542 06/27/19 0258 06/28/19 0522 06/29/19 0428  AST  --   --   --   --  25  ALT  --   --   --   --  29  ALKPHOS  --   --   --   --  79  BILITOT  --   --   --   --  0.6  PROT  --   --   --   --  4.4*  ALBUMIN 2.8* 2.5* 2.4* 2.7* 2.4*   No results for input(s): LIPASE, AMYLASE in the last 168 hours. No results for input(s): AMMONIA in the last 168 hours. Coagulation Profile: No results for input(s): INR, PROTIME in the last 168 hours. Cardiac Enzymes: No results for input(s): CKTOTAL, CKMB, CKMBINDEX, TROPONINI in the last 168 hours. BNP (last 3 results) No results for input(s): PROBNP in the last 8760 hours. HbA1C: No results for input(s): HGBA1C in the last 72 hours. CBG: Recent Labs  Lab 06/29/19 0639 06/29/19 0726 06/29/19 1120 06/29/19 1157 06/29/19 1648  GLUCAP 85 80 66* 72 90   Lipid Profile: No results for input(s): CHOL, HDL, LDLCALC, TRIG, CHOLHDL, LDLDIRECT in the last 72 hours. Thyroid Function Tests: Recent Labs    06/29/19 0430  TSH 2.534   Anemia Panel: Recent Labs    06/27/19 1623  VITAMINB12 417  RETICCTPCT 3.6*   Sepsis Labs: Recent Labs  Lab 06/28/19 1255 06/29/19 0428  PROCALCITON 10.63 9.37    Recent Results (from the past 240 hour(s))  Urine culture     Status: Abnormal   Collection Time: 06/20/19  9:18 AM   Specimen: In/Out Cath Urine  Result Value Ref Range Status   Specimen Description IN/OUT CATH URINE  Final   Special Requests   Final    NONE Performed at [facility], [address] [place], [place] [address]    Culture >=100,000 COLONIES/mL CITROBACTER KOSERI (A)  Final   Report  Status 06/22/2019 FINAL  Final   Organism ID, Bacteria CITROBACTER KOSERI (A)  Final      Susceptibility   Citrobacter koseri - MIC*    CEFAZOLIN <=4 SENSITIVE Sensitive     CEFTRIAXONE <=0.25 SENSITIVE Sensitive     CIPROFLOXACIN <=0.25 SENSITIVE Sensitive     GENTAMICIN <=1 SENSITIVE Sensitive     IMIPENEM <=0.25 SENSITIVE Sensitive     NITROFURANTOIN <=16 SENSITIVE Sensitive     TRIMETH/SULFA <=20 SENSITIVE Sensitive     PIP/TAZO <=4 SENSITIVE Sensitive     * >=100,000 COLONIES/mL CITROBACTER KOSERI    RN Pressure Injury Documentation: Pressure Ulcer 12/11/12 Stage III -  Full thickness tissue loss. Subcutaneous fat may be visible but bone, tendon or muscle are NOT exposed. Left Buttock with predominant Stage II that has small area of Stage III- edit  this wound is Stage III per [place] 8/ (Active)  12/11/12 2130  Location: Buttocks  Location Orientation: Lower;Left  Staging: Stage III -  Full thickness tissue loss. Subcutaneous fat may be visible but bone, tendon or muscle are NOT exposed.  Wound Description (Comments): Left Buttock with predominant Stage II that has small area of Stage III- edit  this wound is Stage III per [facility] 12/13/12  Present on Admission: Yes     Pressure Ulcer 12/11/12 Stage II -  Partial thickness loss of dermis presenting as a shallow open ulcer with a red, pink wound bed without slough. Right Buttock Stage II (Active)  12/11/12 2130  Location: Buttocks  Location Orientation: Lower;Right  Staging: Stage II -  Partial thickness loss of dermis presenting as a shallow open ulcer with a red, pink wound bed without slough.  Wound Description (Comments): Right Buttock Stage II  Present on Admission: Yes     Pressure Ulcer 12/11/12 Stage II -  Partial thickness loss of dermis presenting as a shallow open ulcer with a red, pink wound bed without slough. Healing Stage II (Active)  12/11/12 2130  Location: Buttocks  Location Orientation: Lateral;Lower;Left  Staging:  Stage II -  Partial thickness loss of dermis presenting as a shallow open ulcer with a red, pink wound bed without slough.  Wound Description (Comments): Healing Stage II  Present on Admission: Yes   Estimated body mass index is 20.37 kg/m as calculated from the following:   Height as of this encounter: 6\' 4"  (1.93 m).   Weight as of this encounter: 75.9 kg.  Malnutrition Type:  Nutrition Problem: Severe Malnutrition Etiology: chronic illness(ESRD on HD)   Malnutrition Characteristics:  Signs/Symptoms: severe muscle depletion, severe fat depletion  Nutrition Interventions:  Interventions: Nepro shake   Radiology Studies: No results found.  Scheduled Meds: . amiodarone  150 mg Intravenous Once  . Chlorhexidine Gluconate Cloth  6 each Topical Daily  . Chlorhexidine Gluconate Cloth  6 each Topical Q0600  . darbepoetin (ARANESP) injection - NON-DIALYSIS  60 mcg Subcutaneous Q Fri-1800  . famotidine  20 mg Oral Daily  .  feeding supplement (NEPRO CARB STEADY)  237 mL Oral TID BM  . hydrocortisone sod succinate (SOLU-CORTEF) inj  50 mg Intravenous Daily  . mouth rinse  15 mL Mouth Rinse BID  . midodrine  10 mg Oral TID WC  . tamsulosin  0.4 mg Oral Daily   Continuous Infusions: . sodium chloride Stopped (06/23/19 1406)  . amiodarone 30 mg/hr (06/29/19 0500)  .  ceFAZolin (ANCEF) IV 1 g (06/29/19 1151)  . dextrose 50 mL/hr at 06/29/19 1149    LOS: 10 days   Kerney Elbe, DO [facility] PAGER is on [place]  If 7PM-7AM, please contact night-coverage [email or web address]

## 2019-06-29 NOTE — Progress Notes (Signed)
Physical Therapy Treatment Patient Details Name: Gregory [name] MRN: [id number] DOB: 30-Dec-1950 Today's Date: 06/29/2019    History of Present Illness 69yo male with 2 days of lethargy and confusion/AMS. CTH negative, negative for PE, covid negative at admission. Admitted for sepsis workup, encephalopathy. PMH ESRD on HD, anemia, CHF, HTN, celiac and mesenteric artery aneurysms, iliac artery aneurysm s/p graft repair    PT Comments    Patient is making progress toward PT goals and tolerated functional transfer training well. Pt oriented to self only but pleasant and eager to participate in therapy. Pt follows single step cues (multimodal cues) with increased time to process. Continue to recommend CIR for further skilled PT services to maximize independence and safety with mobility.     Follow Up Recommendations  CIR;Supervision/Assistance - 24 hour     Equipment Recommendations  Other (comment)(TBD/defer)    Recommendations for Other Services       Precautions / Restrictions Precautions Precautions: Fall Precaution Comments: watch HR Restrictions Weight Bearing Restrictions: No    Mobility  Bed Mobility Overal bed mobility: Needs Assistance Bed Mobility: Supine to Sit     Supine to sit: +2 for physical assistance;Mod assist     General bed mobility comments: multimodal cues for sequencing; hand over hand assist to use bed rail; pt able to bring bilat LE to EOB and assistance required to bring hips to EOB and to elevate trunk into sitting   Transfers Overall transfer level: Needs assistance Equipment used: 2 person hand held assist;Rolling walker (2 wheeled) Transfers: Sit to/from Stand;Lateral/Scoot Transfers Sit to Stand: Mod assist;+2 physical assistance;+2 safety/equipment;From elevated surface        Lateral/Scoot Transfers: Mod assist;+2 physical assistance;+2 safety/equipment General transfer comment: multimodal cues for hand placement and sequencing/technique;  pt able to partially stand X 3 trials however upon standing sat back down EOB; pt then able to laterally scoot to dror arm recliner using bed pad to scoot hips; pt does well with counting to 3 to initiate movement  Ambulation/Gait                 Stairs             Wheelchair Mobility    Modified Rankin (Stroke Patients Only)       Balance Overall balance assessment: Needs assistance Sitting-balance support: No upper extremity supported;Feet supported Sitting balance-Leahy Scale: Good     Standing balance support: Bilateral upper extremity supported Standing balance-Leahy Scale: Poor                              Cognition Arousal/Alertness: Awake/alert Behavior During Therapy: Flat affect Overall Cognitive Status: Impaired/Different from baseline Area of Impairment: Orientation;Attention;Memory;Following commands;Safety/judgement;Awareness;Problem solving                 Orientation Level: Disoriented to;Place;Time;Situation Current Attention Level: Sustained Memory: Decreased recall of precautions;Decreased short-term memory Following Commands: Follows one step commands with increased time;Follows one step commands consistently Safety/Judgement: Decreased awareness of safety;Decreased awareness of deficits Awareness: Intellectual Problem Solving: Slow processing;Decreased initiation;Difficulty sequencing;Requires verbal cues;Requires tactile cues General Comments: pt is pleasant and cooperative this session; following single step commands with increased time to process      Exercises      General Comments        Pertinent Vitals/Pain Pain Assessment: No/denies pain Faces Pain Scale: No hurt    Home Living  Prior Function            PT Goals (current goals can now be found in the care plan section) Progress towards PT goals: Progressing toward goals    Frequency    Min 3X/week      PT  Plan Current plan remains appropriate    Co-evaluation              AM-PAC PT "6 Clicks" Mobility   Outcome Measure  Help needed turning from your back to your side while in a flat bed without using bedrails?: A Little Help needed moving from lying on your back to sitting on the side of a flat bed without using bedrails?: A Lot Help needed moving to and from a bed to a chair (including a wheelchair)?: A Lot Help needed standing up from a chair using your arms (e.g., wheelchair or bedside chair)?: A Lot Help needed to walk in hospital room?: Total Help needed climbing 3-5 steps with a railing? : Total 6 Click Score: 11    End of Session Equipment Utilized During Treatment: Gait belt Activity Tolerance: Patient tolerated treatment well Patient left: with call bell/phone within reach;in chair;with chair alarm set;with restraints reapplied(bilat mittens) Nurse Communication: Mobility status PT Visit Diagnosis: Difficulty in walking, not elsewhere classified (R26.2);Muscle weakness (generalized) (M62.81)     Time: 1400-1440 PT Time Calculation (min) (ACUTE ONLY): 40 min  Charges:  $Gait Training: 8-22 mins $Therapeutic Activity: 23-37 mins                     Earney Navy, PTA Acute Rehabilitation Services Pager: [phone] Office: [phone]     Darliss Cheney 06/29/2019, 4:59 PM

## 2019-06-29 NOTE — Progress Notes (Signed)
CRITICAL VALUE ALERT  Critical Value:  Calcium=6.6 ; Platelet= 23 K/uL  Date & Time Notied:  06/29/19 (5:08 AM)  Provider Notified: NP Sharlet Salina

## 2019-06-29 NOTE — Progress Notes (Signed)
Nutrition Follow-up  RD working remotely.   DOCUMENTATION CODES:   Severe malnutrition in context of chronic illness  INTERVENTION:  Monitor magnesium, potassium, and phosphorus daily for at least 3 days, MD to replete as needed, as pt is at risk for refeeding syndrome given prolonged poor PO intake.  Continue Nepro Shake po TID, each supplement provides 425 kcal and 19 grams protein  Recommend small bore feeding tube when platelet levels are higher. Cortrak service available on Tuesdays and Fridays.   When safely able to place small bore feeding tube, consider:  -Nepro 1.8 cal @ 71m/hr, increase 112mhr Q8 until goal rate of 5042mr (1,200m75ms reached.  -30ml73m-stat TID  Recommended tube feeding regimen will provide 2,460 kcals, 142 grams of protein, 872ml 28m water   NUTRITION DIAGNOSIS:   Severe Malnutrition related to chronic illness(ESRD on HD) as evidenced by severe muscle depletion, severe fat depletion.  Ongoing.  GOAL:   Patient will meet greater than or equal to 90% of their needs  Not met.   MONITOR:   PO intake, Supplement acceptance, Labs  REASON FOR ASSESSMENT:   Consult Poor PO  ASSESSMENT:   69 yo 77le admitted with septic shock from fulminant cystitis. PMH includes HTN, CHF, HF, GERD, ESRD on HD, anemia.  2/24 pt upgraded to DYS3/thins diet  RD unable to reach pt via phone.   Discussed pt with RN who reports pt still not taking much PO, but does accept more from family. RN states that pt consumed some Nepro shake when given by his wife, but RN unable to quantify amount.  Of note, pt did have order to receive Cortrak tube yesterday; however, platelets were too low (9 K/uL). Platelets improved today (23 K/uL), but per RN, MD wishing to wait until they are higher to pursue enteral nutrition.   PO intake: 0-15% x last 7 recorded meals (5% average intake)  Pt had CRRT 2/15-2/21/21. Pt received HD today, net UF 230mL. 98mnephrology, dialysate  raised to 3.5 Ca bath due to low Ca.   Medications reviewed and include: Pepcid, Nepro TID, Solu-Cortef, D5W _0 /hr  Labs reviewed: corrected calcium 7.58 (L) CBGs 66-85  Diet Order:   Diet Order            DIET DYS 3 Room service appropriate? Yes with Assist; Fluid consistency: Thin  Diet effective now              EDUCATION NEEDS:   Not appropriate for education at this time  Skin:  Skin Assessment: Reviewed RN Assessment  Last BM:  2/23  Height:   Ht Readings from Last 1 Encounters:  06/19/19 _1  (1.93 m)    Weight:   Wt Readings from Last 1 Encounters:  06/29/19 75.9 kg    BMI:  Body mass index is 20.37 kg/m.  Estimated Nutritional Needs:   Kcal:  2400-2600  Protein:  140-160 gm  Fluid:  1 L   Monia Timmers Larkin InaD, LDN RD pager number and weekend/on-call pager number located in [email or web address]

## 2019-06-29 NOTE — Progress Notes (Signed)
Responded to consult for PIV placement. Pt has working fistula in right arm. Left arm is edematous and painful with ecchymotic areas to anterior forearm. Pt with arm elevated on pillow and heat packs in place. Left arm assessed with ultrasound; no suitable veins for PIV.

## 2019-06-29 NOTE — Progress Notes (Addendum)
[facility] Progress Note   Subjective:   Seen on HD - BP low, UF off and have given IVF bolus + midodrine. Will give IV albumin and keep UF off for now. Looks comfortable, but remains lethargic and minimally responsive. Also noted that Ca low today (even corrected) - will change to higher Ca dialysate bath.  Objective Vitals:   06/29/19 0750 06/29/19 0830 06/29/19 0900 06/29/19 0915  BP: (!) 104/44 (!) 75/32 (!) 80/36 (!) 76/30  Pulse: 99 97 99 89  Resp: 16 16    Temp:      TempSrc:      SpO2: 100% 100%    Weight:      Height:       Physical Exam General: Ill appearing man, NAD Heart: RRR; no murmur Lungs: CTA anteriorly Abdomen: soft, non-tender Extremities: No LE edema, 1+ LUE edema/IV in place Dialysis Access: AVF  Additional Objective Labs: Basic Metabolic Panel: Recent Labs  Lab 06/27/19 0258 06/28/19 0522 06/29/19 0428  NA 138 140 139  K 4.1 4.3 4.4  CL 103 103 103  CO2 25 25 23   GLUCOSE 115* 122* 96  BUN 28* 49* 66*  CREATININE 2.02* 3.30* 4.26*  CALCIUM 7.1* 6.6* 6.3*  PHOS 2.6 3.2 4.0   Liver Function Tests: Recent Labs  Lab 06/27/19 0258 06/28/19 0522 06/29/19 0428  AST  --   --  25  ALT  --   --  29  ALKPHOS  --   --  79  BILITOT  --   --  0.6  PROT  --   --  4.4*  ALBUMIN 2.4* 2.7* 2.4*   CBC: Recent Labs  Lab 06/25/19 0457 06/25/19 0457 06/26/19 0506 06/26/19 0506 06/27/19 0259 06/27/19 0259 06/27/19 1623 06/28/19 1945 06/29/19 0428  WBC 16.4*   < > 19.9*   < > 19.8*  --   --  24.6* 21.6*  NEUTROABS  --   --   --   --   --   --   --  23.0* 19.9*  HGB 8.8*   < > 10.5*   < > 9.4*  --   --  9.0* 8.8*  HCT 26.2*   < > 31.0*   < > 28.4*  --  26.1* 27.1* 25.9*  MCV 87.9  --  88.8  --  89.9  --   --  88.9 88.7  PLT 16*   < > 13*   < > 11*   < > 9* 27* 23*   < > = values in this interval not displayed.   CBG: Recent Labs  Lab 06/28/19 2056 06/29/19 0055 06/29/19 0426 06/29/19 0639 06/29/19 0726  GLUCAP 94 86 79  85 80   Medications: . albumin human    . sodium chloride Stopped (06/23/19 1406)  . albumin human    . amiodarone 30 mg/hr (06/29/19 0500)  .  ceFAZolin (ANCEF) IV 1 g (06/28/19 1236)   . amiodarone  150 mg Intravenous Once  . Chlorhexidine Gluconate Cloth  6 each Topical Daily  . Chlorhexidine Gluconate Cloth  6 each Topical Q0600  . darbepoetin (ARANESP) injection - NON-DIALYSIS  60 mcg Subcutaneous Q Fri-1800  . famotidine  20 mg Oral Daily  . feeding supplement (NEPRO CARB STEADY)  237 mL Oral TID BM  . hydrocortisone sod succinate (SOLU-CORTEF) inj  50 mg Intravenous Daily  . mouth rinse  15 mL Mouth Rinse BID  . midodrine  10 mg Oral TID WC  .  tamsulosin  0.4 mg Oral Daily   [facility] Orders: [facility] M[facility]   3h 93min  400/800  73kg  2/3.5Ca bath  P4  AVF  Hep 3300 No VDRA/ESA  OP Labs 06/10/19: Hgb 12.5 Ca 8.0 Phos 3.9 Alb 3.4 PTH 1743  Assessment/Plan: 1. Septic shock - Urine and Blood cultures + for citrobacter koseri, + splenic infarcts on CT abd/ pelvis. Initially had distended bladder and thought to be stool, then seen by urology and dx'd with pyocystitis, s/p Foley drainage.  On antibiotics, pressors off.  WBC rising. Will order I/O cath to check for recurrent pus in bladder.  2. AMS: CT head negative, has aneurysmal dilatation of bilateral cervical ICAs.  Slowly improving.  3. Thrombocytopenia: Significantly with nadir 9 - hematology following, no schistocytes on smear, LDH 209, haptoglobin neg, not consistent with TTP/HUS, suspect sepsis related. 4. ESRD: Usual MWF sched. Had CRRT here 2/15 - 06/26/19. HD today - doubt will get much UF. 5. Hypotension/volume: BP low/stable. On midodrine, stress dose steroids. 6. Anemia of ESRD: Getting Aranesp 41mcg IV q Fri. 7. A-flutter: improved, per primary 8. Metabolic bone disease: Low Phos - binder held. Ca trending down - on sensipar. Raised dialysate to 3.5Ca bath  Veneta Penton, PA-C 06/29/2019, 9:29 AM  [facility] Pager: [phone]  Pt seen, examined and agree w assess/plan as above with additions as indicated.  [facility] 06/29/2019, 12:03 PM

## 2019-06-29 NOTE — Progress Notes (Signed)
Inpatient Rehabilitation Admissions Coordinator  Patient not at  A level to be considered for an inpt rehab admit at this time. I will follow at a distance.  Danne Baxter, RN, MSN Rehab Admissions Coordinator [phone] 06/29/2019 2:04 PM

## 2019-06-29 NOTE — Progress Notes (Signed)
PIV consult: L arm edematous with large purple discoloration to medial forearm. No suitable vein found for peripheral IV insertion. R arm restricted due to HD access.

## 2019-06-29 NOTE — Progress Notes (Signed)
Received order to in and out cath pt from Jonnie Finner, MD (nephrology)  unsuccessful attempt in and out cath, MD made aware. Alfredia Ferguson, MD dc'ed order after discuss with urology.

## 2019-06-30 ENCOUNTER — Inpatient Hospital Stay (HOSPITAL_COMMUNITY): Payer: Medicare Other

## 2019-06-30 ENCOUNTER — Encounter (HOSPITAL_COMMUNITY): Payer: Self-pay | Admitting: Pulmonary Disease

## 2019-06-30 DIAGNOSIS — A419 Sepsis, unspecified organism: Secondary | ICD-10-CM

## 2019-06-30 DIAGNOSIS — M7989 Other specified soft tissue disorders: Secondary | ICD-10-CM

## 2019-06-30 DIAGNOSIS — R7989 Other specified abnormal findings of blood chemistry: Secondary | ICD-10-CM

## 2019-06-30 DIAGNOSIS — Z515 Encounter for palliative care: Secondary | ICD-10-CM

## 2019-06-30 DIAGNOSIS — Z7189 Other specified counseling: Secondary | ICD-10-CM

## 2019-06-30 LAB — COMPREHENSIVE METABOLIC PANEL
ALT: 16 U/L (ref 0–44)
AST: 20 U/L (ref 15–41)
Albumin: 2.4 g/dL — ABNORMAL LOW (ref 3.5–5.0)
Alkaline Phosphatase: 70 U/L (ref 38–126)
Anion gap: 12 (ref 5–15)
BUN: 32 mg/dL — ABNORMAL HIGH (ref 8–23)
CO2: 24 mmol/L (ref 22–32)
Calcium: 6.5 mg/dL — ABNORMAL LOW (ref 8.9–10.3)
Chloride: 97 mmol/L — ABNORMAL LOW (ref 98–111)
Creatinine, Ser: 2.88 mg/dL — ABNORMAL HIGH (ref 0.61–1.24)
GFR calc Af Amer: 24 mL/min — ABNORMAL LOW (ref >60)
GFR calc non Af Amer: 21 mL/min — ABNORMAL LOW (ref >60)
Glucose, Bld: 180 mg/dL — ABNORMAL HIGH (ref 70–99)
Potassium: 3.8 mmol/L (ref 3.5–5.1)
Sodium: 133 mmol/L — ABNORMAL LOW (ref 135–145)
Total Bilirubin: 0.9 mg/dL (ref 0.3–1.2)
Total Protein: 4.3 g/dL — ABNORMAL LOW (ref 6.5–8.1)

## 2019-06-30 LAB — CBC WITH DIFFERENTIAL/PLATELET
Abs Immature Granulocytes: 0.21 10*3/uL — ABNORMAL HIGH (ref 0.00–0.07)
Basophils Absolute: 0 10*3/uL (ref 0.0–0.1)
Basophils Relative: 0 %
Eosinophils Absolute: 0 10*3/uL (ref 0.0–0.5)
Eosinophils Relative: 0 %
HCT: 25.7 % — ABNORMAL LOW (ref 39.0–52.0)
Hemoglobin: 8.8 g/dL — ABNORMAL LOW (ref 13.0–17.0)
Immature Granulocytes: 1 %
Lymphocytes Relative: 2 %
Lymphs Abs: 0.3 10*3/uL — ABNORMAL LOW (ref 0.7–4.0)
MCH: 29.3 pg (ref 26.0–34.0)
MCHC: 34.2 g/dL (ref 30.0–36.0)
MCV: 85.7 fL (ref 80.0–100.0)
Monocytes Absolute: 1 10*3/uL (ref 0.1–1.0)
Monocytes Relative: 6 %
Neutro Abs: 15.3 10*3/uL — ABNORMAL HIGH (ref 1.7–7.7)
Neutrophils Relative %: 91 %
Platelets: 21 10*3/uL — CL (ref 150–400)
RBC: 3 MIL/uL — ABNORMAL LOW (ref 4.22–5.81)
RDW: 18.1 % — ABNORMAL HIGH (ref 11.5–15.5)
WBC: 16.8 10*3/uL — ABNORMAL HIGH (ref 4.0–10.5)
nRBC: 0 % (ref 0.0–0.2)

## 2019-06-30 LAB — LACTIC ACID, PLASMA: Lactic Acid, Venous: 1.3 mmol/L (ref 0.5–1.9)

## 2019-06-30 LAB — GLUCOSE, CAPILLARY
Glucose-Capillary: 105 mg/dL — ABNORMAL HIGH (ref 70–99)
Glucose-Capillary: 74 mg/dL (ref 70–99)
Glucose-Capillary: 83 mg/dL (ref 70–99)
Glucose-Capillary: 84 mg/dL (ref 70–99)
Glucose-Capillary: 89 mg/dL (ref 70–99)
Glucose-Capillary: 95 mg/dL (ref 70–99)
Glucose-Capillary: 96 mg/dL (ref 70–99)

## 2019-06-30 LAB — BRAIN NATRIURETIC PEPTIDE: B Natriuretic Peptide: 1766.3 pg/mL — ABNORMAL HIGH (ref 0.0–100.0)

## 2019-06-30 LAB — C-REACTIVE PROTEIN: CRP: 6 mg/dL — ABNORMAL HIGH (ref <1.0)

## 2019-06-30 LAB — MAGNESIUM: Magnesium: 1.8 mg/dL (ref 1.7–2.4)

## 2019-06-30 LAB — HEMOGLOBIN AND HEMATOCRIT, BLOOD
HCT: 30.1 % — ABNORMAL LOW (ref 39.0–52.0)
Hemoglobin: 10.3 g/dL — ABNORMAL LOW (ref 13.0–17.0)

## 2019-06-30 LAB — PROCALCITONIN: Procalcitonin: 7.92 ng/mL

## 2019-06-30 LAB — D-DIMER, QUANTITATIVE: D-Dimer, Quant: 14.36 ug/mL-FEU — ABNORMAL HIGH (ref 0.00–0.50)

## 2019-06-30 LAB — TROPONIN I (HIGH SENSITIVITY)
Troponin I (High Sensitivity): 172 ng/L (ref <18)
Troponin I (High Sensitivity): 197 ng/L (ref <18)

## 2019-06-30 LAB — PHOSPHORUS: Phosphorus: 2.9 mg/dL (ref 2.5–4.6)

## 2019-06-30 MED ORDER — PHENYLEPHRINE HCL-NACL 10-0.9 MG/250ML-% IV SOLN
25.0000 ug/min | INTRAVENOUS | Status: DC
  Filled 2019-06-30: qty 250

## 2019-06-30 MED ORDER — PHENYLEPHRINE HCL-NACL 10-0.9 MG/250ML-% IV SOLN
0.0000 ug/min | INTRAVENOUS | Status: DC
  Filled 2019-06-30: qty 250

## 2019-06-30 MED ORDER — PHENYLEPHRINE HCL-NACL 10-0.9 MG/250ML-% IV SOLN
0.0000 ug/min | INTRAVENOUS | Status: DC
  Administered 2019-06-30 (×2): 60 ug/min via INTRAVENOUS
  Administered 2019-06-30: 13.333 ug/min via INTRAVENOUS
  Administered 2019-06-30 (×2): 60 ug/min via INTRAVENOUS
  Administered 2019-07-01: 140 ug/min via INTRAVENOUS
  Administered 2019-07-01: 08:00:00 100 ug/min via INTRAVENOUS
  Administered 2019-07-01: 10:00:00 120 ug/min via INTRAVENOUS
  Administered 2019-07-01: 15:00:00 340 ug/min via INTRAVENOUS
  Administered 2019-07-01: 13:00:00 190 ug/min via INTRAVENOUS
  Administered 2019-07-01: 140 ug/min via INTRAVENOUS
  Administered 2019-07-01: 03:00:00 60 ug/min via INTRAVENOUS
  Administered 2019-07-01: 190 ug/min via INTRAVENOUS
  Filled 2019-06-30 (×14): qty 250

## 2019-06-30 MED ORDER — SODIUM CHLORIDE 0.9 % IV SOLN: 250.0000 mL | INTRAVENOUS | Status: DC

## 2019-06-30 MED ORDER — CHLORHEXIDINE GLUCONATE CLOTH 2 % EX PADS
6.0000 | MEDICATED_PAD | Freq: Every day | CUTANEOUS | Status: DC
  Administered 2019-06-30 – 2019-07-14 (×13): 6 via TOPICAL

## 2019-06-30 MED ORDER — MAGNESIUM SULFATE 2 GM/50ML IV SOLN
2.0000 g | Freq: Once | INTRAVENOUS | Status: AC
  Administered 2019-06-30: 2 g via INTRAVENOUS
  Filled 2019-06-30: qty 50

## 2019-06-30 MED ORDER — ALBUMIN HUMAN 25 % IV SOLN
25.0000 g | Freq: Once | INTRAVENOUS | Status: AC
  Administered 2019-06-30: 12:00:00 25 g via INTRAVENOUS
  Filled 2019-06-30: qty 50

## 2019-06-30 MED ORDER — SODIUM CHLORIDE 0.9 % IV SOLN: Freq: Once | INTRAVENOUS | Status: AC

## 2019-06-30 MED ORDER — SODIUM CHLORIDE 0.9 % IV BOLUS
250.0000 mL | Freq: Once | INTRAVENOUS | Status: AC
  Administered 2019-06-30: 250 mL via INTRAVENOUS

## 2019-06-30 MED ORDER — DEXTROSE 50 % IV SOLN
INTRAVENOUS | Status: AC
  Administered 2019-06-30: 12:00:00 0.5
  Filled 2019-06-30: qty 50

## 2019-06-30 NOTE — Significant Event (Signed)
Rapid Response Event Note  Overview: Hypotension   Initial Focused Assessment: Called by nursing staff to help start vasopressor for patient with SBPs in the 60s. Upon arrival, PCCM NP and staff were present, patient was lethargic, would wake to my voice, answer questions appropriately, moves all extremities. HR 80 - AF, SBP 60-70s, MAPs low 50s. Blood sugar 80. Patient had already received 500cc crystalloid bolus and 100cc of Albumin. Neo-Syn started at 80mcg/kg/min and increased to 60 mcg/kg/min.    Interventions: -- 1/2 amp D50 per PCCM MD -- Neo-Syn infusion started   Plan of Care: -- Transferred to 4N25   Event Summary:  Call Time [place] End Time [place], [place]

## 2019-06-30 NOTE — Progress Notes (Signed)
[facility] Progress Note   Subjective:  Much more awake/conversant today. Denies CP/dyspnea.  Objective Vitals:   06/29/19 2100 06/29/19 2345 06/30/19 0451 06/30/19 0900  BP: (!) 108/52 (!) 127/54 (!) 115/54 (!) 95/52  Pulse: 79  70 89  Resp: 16 15 16    Temp: (!) 97.5 F (36.4 C) 97.7 F (36.5 C) 98 F (36.7 C) (!) 97.4 F (36.3 C)  TempSrc: Oral Oral Oral Oral  SpO2: 99% 98% 100% 100%  Weight:   76.3 kg   Height:       Physical Exam General: NAD, much more awake/converstant today. Heart: RRR; no murmur Lungs: CTA anteriorly Abdomen: soft, non-tender Extremities: Trace BLE edema, 1+ LUE edema/IV in place Dialysis Access: AVF  Additional Objective Labs: Basic Metabolic Panel: Recent Labs  Lab 06/28/19 0522 06/29/19 0428 06/30/19 0432  NA 140 139 133*  K 4.3 4.4 3.8  CL 103 103 97*  CO2 25 23 24   GLUCOSE 122* 96 180*  BUN 49* 66* 32*  CREATININE 3.30* 4.26* 2.88*  CALCIUM 6.6* 6.3* 6.5*  PHOS 3.2 4.0 2.9   Liver Function Tests: Recent Labs  Lab 06/28/19 0522 06/29/19 0428 06/30/19 0432  AST  --  25 20  ALT  --  29 16  ALKPHOS  --  79 70  BILITOT  --  0.6 0.9  PROT  --  4.4* 4.3*  ALBUMIN 2.7* 2.4* 2.4*   CBC: Recent Labs  Lab 06/26/19 0506 06/26/19 0506 06/27/19 0259 06/27/19 1623 06/28/19 1945 06/29/19 0428 06/30/19 0432  WBC 19.9*   < > 19.8*  --  24.6* 21.6* 16.8*  NEUTROABS  --   --   --   --  23.0* 19.9* 15.3*  HGB 10.5*   < > 9.4*  --  9.0* 8.8* 8.8*  HCT 31.0*   < > 28.4*   < > 27.1* 25.9* 25.7*  MCV 88.8  --  89.9  --  88.9 88.7 85.7  PLT 13*   < > 11*   < > 27* 23* 21*   < > = values in this interval not displayed.   Blood Culture    Component Value Date/Time   SDES IN/OUT CATH URINE 06/20/2019 0918   SPECREQUEST  06/20/2019 0918    NONE Performed at [facility] [address], [place], [place] [address]    CULT >=100,000 COLONIES/mL CITROBACTER KOSERI (A) 06/20/2019 0918   REPTSTATUS 06/22/2019  FINAL 06/20/2019 0918   Medications: . sodium chloride Stopped (06/23/19 1406)  . amiodarone 30 mg/hr (06/30/19 1001)  .  ceFAZolin (ANCEF) IV 1 g (06/29/19 1151)  . dextrose Stopped (06/29/19 1700)   . amiodarone  150 mg Intravenous Once  . Chlorhexidine Gluconate Cloth  6 each Topical Daily  . Chlorhexidine Gluconate Cloth  6 each Topical Q0600  . darbepoetin (ARANESP) injection - NON-DIALYSIS  60 mcg Subcutaneous Q Fri-1800  . famotidine  20 mg Oral Daily  . feeding supplement (NEPRO CARB STEADY)  237 mL Oral TID BM  . hydrocortisone sod succinate (SOLU-CORTEF) inj  50 mg Intravenous Daily  . mouth rinse  15 mL Mouth Rinse BID  . midodrine  10 mg Oral TID WC  . tamsulosin  0.4 mg Oral Daily    Dialysis Orders: [facility] MWF 3h 75min 400/800 73kg 2/3.5Ca bath P4 AVF Hep 3300 No VDRA/ESA  OP Labs 06/10/19: Hgb 12.5 Ca 8.0 Phos 3.9 Alb 3.4 PTH 1743  Assessment/Plan: 1. Septic shock - Urine and Blood  cultures + for citrobacter koseri,+ splenic infarcts on CT abd/ pelvis.Initially had distended bladder and thought to be stool, then seen by urologyanddx'd with pyocystitis, s/p Foley drainage. On antibiotics, pressors off. WBC better today. Failed I&O foley attempt yesterday. 2. AMS: CT head negative, has aneurysmal dilatation of bilateral cervical ICAs. Slowly improving - clear improvement today. 3. Thrombocytopenia: Significant with nadir 9 - given 1U Platelets. Hematology following, no schistocytes on smear, LDH 209, haptoglobin neg, not consistent with TTP/HUS, suspect sepsis related. 4. ESRD: Usual MWF sched. HadCRRT here2/15 - 06/26/19. Next HD 2/26. 5. Hypotension/volume: BP low/stable. On midodrine and stress dose steroids. 6. Anemia of ESRD: Hgb 8.8 - getting Aranesp 57mcg IV q Fri. 7. A-flutter: improved, per primary 8. Metabolic bone disease: Low Phos - binder held. Ca trending down - on sensipar. Raised dialysate to 3.5Ca bath.  Veneta Penton, PA-C 06/30/2019,  10:30 AM  [employer]

## 2019-06-30 NOTE — Progress Notes (Signed)
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  Was diagnosed with septic shock with severe thrombocytopenia, source was bladder infection.  He was seen by urology, nephrology, hematology.  PCCM was the primary service and he was transferred to hospitalist service on 06/28/2019 on day 11 of his hospital stay.  Still extremely frail and tenuous and I have now called palliative care for further goals of care discussion.  PT OT recommending CIR. the patient became acutely hypotensive and did not really respond to Solu-Cortef, to normal saline boluses as well as IV albumin so critical care was called and they are going to be transferring him back to the ICU and placing him on Neo-Synephrine.  Assessment & Plan:   Active Problems:   End-stage renal disease on hemodialysis (HCC)   Septic shock (HCC)   Protein-calorie malnutrition, severe   Thrombocytopenia (HCC)  Septic shock from Pyocystitis - Citrobacter bacteremia, causing severe thrombocytopenia. -Based on culture and sensitivity data he is currently on Ancef, mildly improved but overall still septic now currently hypotensive again, will trend CRP and procalcitonin, case discussed with urologist Dr. Tresa Moore and nephrologist Dr. Jonnie Finner, his bladder pus was drained with Foley catheter and does not require a indwelling Foley and Dr. Tresa Moore recommends stopping I and O Cath's.   -Continue antibiotics with IV Cefazolin   -Continue to monitor closely.  Overall extremely tenuous   and became extremely hypotensive today and dizzy -Called Palliative Care Medicine for [place]  Consultation  -WBC is now improving slightly went from 21.6 is now 16.4 -Procalcitonin level is 10.63 and went down to 7.92 -CRP is continue to trend upwards as well D-Dimer  -Was on IV Solu-Cortef which will be continued -Continue to Monitor CBC and Temperature Curve -Nephrology reordered INO cath to check for recurrent pus in the bladder but urology recommended against this -Given his hypotension today he was given 250 mL boluses, Solu-Cortef, as well as IV albumin without improvement so he will be transferred to the ICU and started on pressors with Neo-Synephrine again  Severe Thrombocytopenia  -Likely due to septic shock and bone marrow suppression, seen by hematologist this admission,  -Platelet count fell below 9000 and he was transfused 1 unit of platelets.   -Platelet Count went to 27,000 and today is 21,000  -Continue to Monitor for S/Sx of Bleeding -Repeat CBC in AM  Elevated D-Dimer -D-dimer 13.37 and worsened and 14.36 -CTA PE showed no acute PE but did show small bilateral pleural effusions with adjacent atelectasis and a dilated main pulmonary artery which is secondary to pulmonary arterial hypertension there is interval development of splenic infarcts  BPH -On Flomax.  Hardly makes any urine anymore. -Stop I and O Cath's per my Discussion with Urology Dr. Tresa Moore  -Urology to follow up today   ESRD with hypotension -Dialysis per schedule, nephrology on board. -BUN/Cr went from 49/3.30 -> 66/4.26 -> 32/2.88 -Patient was Dialyzed yesterday; patient was given IV albumin and kept off of ultrafiltration for now -Continue to Monitor and Trend Renal Fxn  Dysphagia -On dysphagia 2 diet but now is back on a dysphagia 3 diet with thin liquids.  Speech following.  Avoid core track or NG tube due to severe thrombocytopenia. -Started Low dose IVF at 50 mL/hr with D5W  New onset A. fib RVR/A Flutter -Mali vas 2 score of at least 3.  -Currently on amnio drip for rate control,  currently not a candidate for anticoagulation due to severe thrombocytopenia.  EchoCardiogram stable.   -Checked TSH was 2.534.   -If rate control becomes an issue cardiology will be consulted -On Amio gtt but will hold   History of celiac and superior mesenteric artery aneurysms.   -Outpatient follow-up with vascular surgery. -CTA showed stable appearance of the aneurysmal dilatation of the proximal celiac axis and patient is status post endovascular repair of the bilateral common iliac artery aneurysms with stable appearance of the hardware  Severe Deconditioning.  -C/w PT OT.  May require placement in the recommending CIR -Palliative care consulted for further evaluation recommendations -Palliative Care Consulted for GOC   Severe Protein Calorie Malnutrition -Placed on prostat. -Nutritionist consulted and recommending continue to monitor magnesium, potassium and phosphorus daily for least 3 days and continue Nepro shakes p.o. 3 times daily -They recommend a small bore feeding tube when platelet levels are higher with core track service available Tuesday for the next-when he is safely able to place a small bore feeding tube they recommend considering Nepro 1.8 cal at 20 mL/h and increase to 10 mL/h every 8 hours until goal rate of 50 mL/h next-they recommend 30 mL of prostate 3 times daily -Because patient not taking in much we will start gentle IV fluid hydration with D5W at 50 MLS per hour   DVT prophylaxis: SCDs Code Status: FULL CODE  Family Communication: No family present at bedside  Disposition Plan: Pending clinical improvement and if patient is clinically improved significantly then he can be discharged to CIR but will need goals of care discussion prior to this and will need to see if his platelet counts recover to see if he can take more p.o. intake.  Consultants:   Nephrology  Urology  PCCM Transfer    Procedures:  TTE   Echo 2/14  1. Left ventricular ejection  fraction, by estimation, is 60 to 65%. The left ventricle has normal function. The left ventricle has no regional wall motion abnormalities. Left ventricular diastolic parameters are indeterminate.  2. Right ventricular systolic function is normal. The right ventricular size is normal.  3. Left atrial size was severely dilated.  4. Right atrial size was mildly dilated.  5. The mitral valve is degenerative. Mild mitral valve regurgitation. No evidence of mitral stenosis.  6. The aortic valve is tricuspid. Aortic valve regurgitation is mild to moderate. Mild aortic valve stenosis.  7. The inferior vena cava is normal in size with greater than 50% respiratory variability, suggesting right atrial pressure of 3 mmHg.    RUQ [place] -    1. Both the gallbladder and the CBD appear mildly distended with sludge but no stone identified. No intrahepatic biliary ductal dilatation to strongly suggest acute bile duct obstruction. Some gallbladder wall thickening, although no sonographic Murphy sign elicited to strongly suggest acalculous cholecystitis.  2. If abdominal pain continues then a nuclear medicine hepatobiliary scan would be valuable to exclude acute CBD or cystic duct Obstruction.   CT Head - non acute  RUQ [place] - non acute  CT Chest - Abd - Pelvis -  1. No acute pulmonary embolism. 2. Small bilateral pleural effusions with adjacent atelectasis. 3. Dilated main pulmonary artery, which  may be secondary to pulmonary arterial hypertension. 4. Interval development of wedge-shaped defects involving the spleen, which may represent splenic infarcts. 5. Cholelithiasis with mild gallbladder wall thickening and pericholecystic free fluid, which may be secondary to underlying liver disease. If there is clinical concern for acute cholecystitis, follow-up with ultrasound is recommended. 6. 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 7. Status post endovascular repair of  bilateral common iliac artery aneurysms with stable appearance of the hardware. 8. Additional chronic findings as detailed above. 9. Aortic Atherosclerosis.   Antimicrobials:  Anti-infectives (From admission, onward)   Start     Dose/Rate Route Frequency Ordered Stop   06/28/19 0930  ceFAZolin (ANCEF) IVPB 1 g/50 mL premix     1 g 100 mL/hr over 30 Minutes Intravenous Every 24 hours 06/28/19 0924 07/03/19 0929   06/21/19 1400  cefTRIAXone (ROCEPHIN) 2 g in sodium chloride 0.9 % 100 mL IVPB  Status:  Discontinued     2 g 200 mL/hr over 30 Minutes Intravenous Every 24 hours 06/21/19 1002 06/28/19 0924   06/20/19 1200  vancomycin (VANCOCIN) IVPB 1000 mg/200 mL premix  Status:  Discontinued     1,000 mg 200 mL/hr over 60 Minutes Intravenous Every M-W-F (Hemodialysis) 06/19/19 0232 06/20/19 1109   06/20/19 1200  meropenem (MERREM) 1 g in sodium chloride 0.9 % 100 mL IVPB  Status:  Discontinued     1 g 200 mL/hr over 30 Minutes Intravenous Every 8 hours 06/20/19 1112 06/21/19 1002   06/19/19 2200  ceFEPIme (MAXIPIME) 1 g in sodium chloride 0.9 % 100 mL IVPB  Status:  Discontinued     1 g 200 mL/hr over 30 Minutes Intravenous Every 24 hours 06/19/19 0232 06/20/19 1109   06/19/19 0230  vancomycin (VANCOREADY) IVPB 750 mg/150 mL     750 mg 150 mL/hr over 60 Minutes Intravenous  Once 06/19/19 0227 06/19/19 0435   06/17/2019 2330  ceFEPIme (MAXIPIME) 2 g in sodium chloride 0.9 % 100 mL IVPB     2 g 200 mL/hr over 30 Minutes Intravenous  Once 06/17/2019 2329 06/19/19 0052   06/19/2019 2330  metroNIDAZOLE (FLAGYL) IVPB 500 mg     500 mg 100 mL/hr over 60 Minutes Intravenous  Once 06/25/2019 2329 06/19/19 0205   06/08/2019 2330  vancomycin (VANCOCIN) IVPB 1000 mg/200 mL premix     1,000 mg 200 mL/hr over 60 Minutes Intravenous  Once 06/29/2019 2329 06/19/19 0205     Subjective: Seen and examined at bedside he was doing better this morning but then became hypotensive this afternoon and was very difficult  to rouse.  He finally awoke and was complaining of some lightheadedness.  No chest pain, nausea or vomiting. BP remained low so will be transferred to ICU.   Objective: Vitals:   06/30/19 1146 06/30/19 1151 06/30/19 1156 06/30/19 1201  BP: (!) 65/42 (!) 66/43 (!) 68/41 (!) 70/43  Pulse: 88 86 82 78  Resp: 15 15 16 15   Temp:      TempSrc:      SpO2: 92% 97% 95% 96%  Weight:      Height:        Intake/Output Summary (Last 24 hours) at 06/30/2019 1243 Last data filed at 06/30/2019 0200 Gross per 24 hour  Intake 350.07 ml  Output --  Net 350.07 ml   Filed Weights   06/29/19 0746 06/29/19 1100 06/30/19 0451  Weight: 75.9 kg 75.9 kg 76.3 kg   Examination: Physical Exam:  Constitutional:  Thin frail elderly AAM in NAD and appears calm but did appear slightly uncomfortable this AM prior to becoming hypotensive  Eyes: Lids and conjunctivae normal, sclerae anicteric  ENMT: External Ears, Nose appear normal. Grossly normal hearing.  Neck: Appears normal, supple, no cervical masses, normal ROM, no appreciable thyromegaly; No JVD but has a TDC in the Right IJ Respiratory: Diminished to auscultation bilaterally, no wheezing, rales, rhonchi or crackles. Normal respiratory effort and patient is not tachypenic. No accessory muscle use. Unlabored breathing  Cardiovascular: Irregularly Irregular, no murmurs / rubs / gallops. S1 and S2 auscultated. No extremity edema.  Abdomen: Soft, non-tender, non-distended. Bowel sounds positive.  GU: Deferred. Musculoskeletal: No clubbing / cyanosis of digits/nails. No joint deformity upper and lower extremities.  Skin: No rashes, lesions, ulcers on a limited skin evaluation. No induration; Warm and dry.  Neurologic: CN 2-12 grossly intact with no focal deficits. Romberg sign and cerebellar reflexes not assessed.  Psychiatric: Normal judgment and insight. Alert and oriented x 3. Normal mood and appropriate affect.   Data Reviewed: I have personally reviewed  following labs and imaging studies  CBC: Recent Labs  Lab 06/26/19 0506 06/26/19 0506 06/27/19 0259 06/27/19 1623 06/28/19 1945 06/29/19 0428 06/30/19 0432  WBC 19.9*  --  19.8*  --  24.6* 21.6* 16.8*  NEUTROABS  --   --   --   --  23.0* 19.9* 15.3*  HGB 10.5*  --  9.4*  --  9.0* 8.8* 8.8*  HCT 31.0*   < > 28.4* 26.1* 27.1* 25.9* 25.7*  MCV 88.8  --  89.9  --  88.9 88.7 85.7  PLT 13*   < > 11* 9* 27* 23* 21*   < > = values in this interval not displayed.   Basic Metabolic Panel: Recent Labs  Lab 06/26/19 0506 06/26/19 1542 06/26/19 1902 06/27/19 0258 06/27/19 0259 06/28/19 0522 06/29/19 0428 06/30/19 0432  NA  --  142  --  138  --  140 139 133*  K  --  3.8  --  4.1  --  4.3 4.4 3.8  CL  --  99  --  103  --  103 103 97*  CO2  --  25  --  25  --  25 23 24   GLUCOSE  --  138*  --  115*  --  122* 96 180*  BUN  --  17  --  28*  --  49* 66* 32*  CREATININE  --  1.38*  --  2.02*  --  3.30* 4.26* 2.88*  CALCIUM  --  6.8*  --  7.1*  --  6.6* 6.3* 6.5*  MG   < >  --  2.3  --  2.3 2.1 2.2 1.8  PHOS  --  3.5  --  2.6  --  3.2 4.0 2.9   < > = values in this interval not displayed.   GFR: Estimated Creatinine Clearance: 25.8 mL/min (A) (by C-G formula based on SCr of 2.88 mg/dL (H)). Liver Function Tests: Recent Labs  Lab 06/26/19 1542 06/27/19 0258 06/28/19 0522 06/29/19 0428 06/30/19 0432  AST  --   --   --  25 20  ALT  --   --   --  29 16  ALKPHOS  --   --   --  79 70  BILITOT  --   --   --  0.6 0.9  PROT  --   --   --  4.4* 4.3*  ALBUMIN 2.5* 2.4* 2.7* 2.4* 2.4*   No results for input(s): LIPASE, AMYLASE in the last 168 hours. No results for input(s): AMMONIA in the last 168 hours. Coagulation Profile: No results for input(s): INR, PROTIME in the last 168 hours. Cardiac Enzymes: No results for input(s): CKTOTAL, CKMB, CKMBINDEX, TROPONINI in the last 168 hours. BNP (last 3 results) No results for input(s): PROBNP in the last 8760 hours. HbA1C: No results for  input(s): HGBA1C in the last 72 hours. CBG: Recent Labs  Lab 06/30/19 0014 06/30/19 0400 06/30/19 0517 06/30/19 0743 06/30/19 1126  GLUCAP 95 74 96 83 84   Lipid Profile: No results for input(s): CHOL, HDL, LDLCALC, TRIG, CHOLHDL, LDLDIRECT in the last 72 hours. Thyroid Function Tests: Recent Labs    06/29/19 0430  TSH 2.534   Anemia Panel: Recent Labs    06/27/19 1623  VITAMINB12 417  RETICCTPCT 3.6*   Sepsis Labs: Recent Labs  Lab 06/28/19 1255 06/29/19 0428 06/30/19 0432  PROCALCITON 10.63 9.37 7.92    No results found for this or any previous visit (from the past 240 hour(s)).  RN Pressure Injury Documentation: Pressure Ulcer 12/11/12 Stage III -  Full thickness tissue loss. Subcutaneous fat may be visible but bone, tendon or muscle are NOT exposed. Left Buttock with predominant Stage II that has small area of Stage III- edit  this wound is Stage III per [place] 8/ (Active)  12/11/12 2130  Location: Buttocks  Location Orientation: Lower;Left  Staging: Stage III -  Full thickness tissue loss. Subcutaneous fat may be visible but bone, tendon or muscle are NOT exposed.  Wound Description (Comments): Left Buttock with predominant Stage II that has small area of Stage III- edit  this wound is Stage III per [facility] 12/13/12  Present on Admission: Yes     Pressure Ulcer 12/11/12 Stage II -  Partial thickness loss of dermis presenting as a shallow open ulcer with a red, pink wound bed without slough. Right Buttock Stage II (Active)  12/11/12 2130  Location: Buttocks  Location Orientation: Lower;Right  Staging: Stage II -  Partial thickness loss of dermis presenting as a shallow open ulcer with a red, pink wound bed without slough.  Wound Description (Comments): Right Buttock Stage II  Present on Admission: Yes     Pressure Ulcer 12/11/12 Stage II -  Partial thickness loss of dermis presenting as a shallow open ulcer with a red, pink wound bed without slough. Healing Stage II  (Active)  12/11/12 2130  Location: Buttocks  Location Orientation: Lateral;Lower;Left  Staging: Stage II -  Partial thickness loss of dermis presenting as a shallow open ulcer with a red, pink wound bed without slough.  Wound Description (Comments): Healing Stage II  Present on Admission: Yes   Estimated body mass index is 20.48 kg/m as calculated from the following:   Height as of this encounter: 6\' 4"  (1.93 m).   Weight as of this encounter: 76.3 kg.  Malnutrition Type:  Nutrition Problem: Severe Malnutrition Etiology: chronic illness(ESRD on HD)   Malnutrition Characteristics:  Signs/Symptoms: severe muscle depletion, severe fat depletion  Nutrition Interventions:  Interventions: Nepro shake   Radiology Studies: No results found.  Scheduled Meds:  amiodarone  150 mg Intravenous Once   Chlorhexidine Gluconate Cloth  6 each Topical Q0600   darbepoetin (ARANESP) injection - NON-DIALYSIS  60 mcg Subcutaneous Q Fri-1800   famotidine  20 mg Oral Daily   feeding supplement (NEPRO CARB STEADY)  237 mL Oral TID BM  mouth rinse  15 mL Mouth Rinse BID   midodrine  10 mg Oral TID WC   Continuous Infusions:  sodium chloride Stopped (06/23/19 1406)   sodium chloride     amiodarone Stopped (06/30/19 1050)    ceFAZolin (ANCEF) IV 1 g (06/30/19 1054)   dextrose Stopped (06/29/19 1700)   phenylephrine (NEO-SYNEPHRINE) Adult infusion      LOS: 11 days   Kerney Elbe, DO [facility] PAGER is on AMION  If 7PM-7AM, please contact night-coverage [email or web address]

## 2019-06-30 NOTE — Consult Note (Signed)
Consultation Note Date: 06/30/2019   Patient Name: Gregory [name]  DOB: Aug 06, 1950  MRN: [id number]  Age / Sex: 69 y.o., male  PCP: Nicholos Johns, MD Referring Physician: Kipp Brood, MD  Reason for Consultation: Establishing goals of care and Psychosocial/spiritual support  HPI/Patient Profile: 69 y.o. 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  He was transferred out of ICU and to [facility] on 2/23. Returned to ICU on 2/25 for recurrent hypotension  Clinical Assessment and Goals of Care: Mr. Low is resting quietly in bed.  He is able to tell me his name and that we are in the hospital, but tells me that the month is October.  He is able to make his basic needs known. There is no family at bedside at this time.  Ultrasound is at bedside.    I ask how he feels and he tells me "pretty good, considering".  I reassure him that the medical team is treating and testing.    We talk about HCPOA and Mr. Bruntz tells me that his wife, Mickel Baas, would make choices if he cannot.   We talk about CODE STATUS, if we are providing treatment and he worsens, his heart stops, should we press on his chest to try and restart it, tube into his lung to breath for him, "life support".  He considers this and tells me that he would want to attempt resuccitation. I ask how long for life support, 2 days, 2 weeks, forever.... and he tells me "as long as it takes".  I ask Mr. Tadesse is he is not improving/responding, could Mickel Baas make a different choice.  He considers this and tells me yes.   I reassure him that we will respect his wishes and care for him.   Conference with bedside  nursing staff related to patient condition, needs, [place] discussion.   Limited discussions today dt acuity of condition.  PMT to follow.   HCPOA   NEXT OF KIN - Mr. Vosler names his wife, Mickel Baas as his healthcare surrogate    SUMMARY OF RECOMMENDATIONS   FULL scope/code  Code Status/Advance Care Planning:  Full code  Symptom Management:   Per hospitalist/CCM, no additional needs at this time.  Palliative Prophylaxis:   Frequent Pain Assessment and Turn Reposition  Additional Recommendations (Limitations, Scope, Preferences):  Full Scope Treatment  Psycho-social/Spiritual:   Desire for further Chaplaincy support:no  Additional Recommendations: Caregiving  Support/Resources  Prognosis:   Unable to determine, based on outcomes. Guarded at this time.   Discharge Planning: to be determined, based on outcomes.       Primary Diagnoses: Present on Admission: **None**   I have reviewed the medical record, interviewed the patient and family, and examined the patient. The following aspects are pertinent.  Past Medical History:  Diagnosis Date  . Anemia in  chronic kidney disease   . Arthritis    "hands; basically all my joints" (11/08/2014)  . CHF (congestive heart failure) ([place])   . Chronic back pain    "mostly lower" (11/08/2014)  . End stage renal disease ([place])    pt does not urinate; pt. states that he does dialysis on MWF; [place]" (11/08/2014)  . GERD (gastroesophageal reflux disease)    "sometimes" (11/08/2014)  . Gout    "hands" (11/08/2014)  . Heart failure with reduced ejection fraction ([place])   . Hypertension    Social History   Socioeconomic History  . Marital status: Married    Spouse name: Not on file  . Number of children: 2  . Years of education: Not on file  . Highest education level: Not on file  Occupational History  . Occupation: Retired  Tobacco Use  . Smoking status: Never Smoker  . Smokeless tobacco: Never Used  Substance and Sexual  Activity  . Alcohol use: No  . Drug use: No  . Sexual activity: Not Currently  Other Topics Concern  . Not on file  Social History Narrative  . Not on file   Social Determinants of Health   Financial Resource Strain:   . Difficulty of Paying Living Expenses: Not on file  Food Insecurity:   . Worried About Charity fundraiser in the Last Year: Not on file  . Ran Out of Food in the Last Year: Not on file  Transportation Needs:   . Lack of Transportation (Medical): Not on file  . Lack of Transportation (Non-Medical): Not on file  Physical Activity:   . Days of Exercise per Week: Not on file  . Minutes of Exercise per Session: Not on file  Stress:   . Feeling of Stress : Not on file  Social Connections:   . Frequency of Communication with Friends and Family: Not on file  . Frequency of Social Gatherings with Friends and Family: Not on file  . Attends Religious Services: Not on file  . Active Member of Clubs or Organizations: Not on file  . Attends Archivist Meetings: Not on file  . Marital Status: Not on file   Family History  Problem Relation Age of Onset  . Diabetes Father   . Hypertension Father   . Diabetes Sister    Scheduled Meds: . amiodarone  150 mg Intravenous Once  . Chlorhexidine Gluconate Cloth  6 each Topical Q0600  . darbepoetin (ARANESP) injection - NON-DIALYSIS  60 mcg Subcutaneous Q Fri-1800  . famotidine  20 mg Oral Daily  . feeding supplement (NEPRO CARB STEADY)  237 mL Oral TID BM  . mouth rinse  15 mL Mouth Rinse BID  . midodrine  10 mg Oral TID WC   Continuous Infusions: . sodium chloride Stopped (06/23/19 1406)  . sodium chloride Stopped (06/30/19 1303)  . amiodarone Stopped (06/30/19 1050)  .  ceFAZolin (ANCEF) IV 1 g (06/30/19 1054)  . dextrose Stopped (06/29/19 1700)  . magnesium sulfate bolus IVPB 2 g (06/30/19 1357)  . phenylephrine (NEO-SYNEPHRINE) Adult infusion 60 mcg/min (06/30/19 1312)   PRN Meds:.diphenhydrAMINE,  loperamide, ondansetron (ZOFRAN) IV Medications Prior to Admission:  Prior to Admission medications   Medication Sig Start Date End Date Taking? Authorizing Provider  B Complex-C-Folic Acid (DIALYVITE 144) 0.8 MG WAFR Take 1 tablet by mouth daily. 05/30/19  Yes [provider]  calcitRIOL (ROCALTROL) 0.5 MCG capsule Take 2 mcg by mouth daily. 05/30/19  Yes [provider]  cinacalcet (SENSIPAR) 90 MG tablet Take 90 mg by mouth daily with supper. 05/20/19  Yes [provider]  colchicine 0.6 MG tablet Take 0.6 mg by mouth daily as needed (for gout flare ups.).  12/11/14  Yes [provider]  latanoprost (XALATAN) 0.005 % ophthalmic solution Place 1 drop into both eyes at bedtime.   Yes [provider]  midodrine (PROAMATINE) 10 MG tablet Take 10 mg by mouth See admin instructions. Take one tablet (10 mg) by mouth prior to dialysis on Monday, Wednesday, Friday 05/10/19  Yes [provider]  Oxycodone HCl 10 MG TABS Take 10 mg by mouth 5 (five) times daily as needed (back pain).   Yes [provider]  promethazine (PHENERGAN) 25 MG tablet Take 25 mg by mouth every 6 (six) hours as needed for nausea or vomiting.   Yes [provider]  sucroferric oxyhydroxide (VELPHORO) 500 MG chewable tablet Chew 500 mg by mouth 2 (two) times daily with a meal.    Yes [provider]   No Known Allergies Review of Systems  Unable to perform ROS: Acuity of condition    Physical Exam Vitals and nursing note reviewed.  Constitutional:      General: He is not in acute distress.    Appearance: He is not ill-appearing.  Cardiovascular:     Rate and Rhythm: Normal rate.  Pulmonary:     Effort: Pulmonary effort is normal. No respiratory distress.  Abdominal:     General: Abdomen is flat. There is no distension.  Skin:    General: Skin is warm and dry.  Neurological:     Mental Status: He is alert.     Comments: Oriented to person and  place, thinks it's October.   Psychiatric:     Comments: Calm, not fearful.      Vital Signs: BP (!) 92/54 (BP Location: Left Arm)   Pulse 79   Temp 97.7 F (36.5 C) (Oral)   Resp 20   Ht 6\' 4"  (1.93 m)   Wt 76.3 kg   SpO2 98%   BMI 20.48 kg/m  Pain Scale: 0-10   Pain Score: 0-No pain   SpO2: SpO2: 98 % O2 Device:SpO2: 98 % O2 Flow Rate: .O2 Flow Rate (L/min): 3 L/min  IO: Intake/output summary:   Intake/Output Summary (Last 24 hours) at 06/30/2019 1432 Last data filed at 06/30/2019 0200 Gross per 24 hour  Intake 350.07 ml  Output --  Net 350.07 ml    LBM: Last BM Date: 06/29/19 Baseline Weight: Weight: 72.8 kg Most recent weight: Weight: 76.3 kg     Palliative Assessment/Data:   Flowsheet Rows     Most Recent Value  Intake Tab  Referral Department  Hospitalist  Unit at Time of Referral  Cardiac/Telemetry Unit  Palliative Care Primary Diagnosis  Sepsis/Infectious Disease  Date Notified  06/29/19  Palliative Care Type  New Palliative care  Reason for referral  Clarify Goals of Care  Date of Admission  07/01/2019  Date first seen by Palliative Care  06/30/19  # of days Palliative referral response time  1 Day(s)  # of days IP prior to Palliative referral  11  Clinical Assessment  Palliative Performance Scale Score  20%  Pain Max last 24 hours  Not able to report  Pain Min Last 24 hours  Not able to report  Dyspnea Max Last 24 Hours  Not able to report  Dyspnea Min Last 24 hours  Not able to report  Psychosocial & Spiritual Assessment  Palliative Care Outcomes      Time In: 8242 Time Out: 1510 Time Total: 50 minutes  Greater than 50%  of this time was spent counseling and coordinating care related to the above assessment and plan.  Signed by: Drue Novel, NP   Please contact Palliative Medicine Team phone at [phone] for questions and concerns.  For individual provider: See Shea Evans

## 2019-06-30 NOTE — Progress Notes (Signed)
Patient left for CT. Notified Susie RN/ or patient's nurse to re-consult VAST when patient back in room VU. Fran Lowes, RN

## 2019-06-30 NOTE — Care Management Important Message (Signed)
Important Message  Patient Details  Name: Gregory [name] MRN: [id number] Date of Birth: December 15, 1950   Medicare Important Message Given:  Yes     Shelda Altes 06/30/2019, 11:54 AM

## 2019-06-30 NOTE — Progress Notes (Addendum)
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  He was transferred out of ICU and to [facility] on 2/23.  Called 2/25 for recurrent hypotension.   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  His wife reports that he is normally alert and oriented, but seemed to be "spaced out" and was having a hard time answering questions.  He had no URI symptoms, fever, diarrhea, known ill contacts or obvious focal neuro deficits.  He missed dialysis on 2/13 and when he was not improving his wife called EMS.  Work-up in the ED revealed leukocytosis of 25k, thrombocytopenia, lactic acid of 3.2, bilirubin 2.4 with normal LFTs.  He received 3.5 L IV fluids and empiric cefepime, vancomycin, Flagyl after which his blood pressure remained borderline and mental status only slightly improved.   Patient will answer questions and mentions left-sided back pain, denies abdominal pain. CT head was ordered and PCCM consulted for admission.  Covid-19 negative.  Past Medical History   has a past medical history of Anemia in chronic kidney disease, Arthritis, CHF (congestive heart failure) ([place]), Chronic back pain, End stage renal disease ([place]), GERD (gastroesophageal reflux disease),  Gout, Heart failure with reduced ejection fraction ([place]), and Hypertension. celiac and superior mesenteric artery aneurysms  Significant Hospital Events   2/14 Admit to [facility] 2/14 He developed SVT, was hemodynamically unstable, treated initially with adenosine but ultimately required DCCV around 715. Has remained hypotensive, received albumin and now another 2 L IV fluid resuscitation (5 L total). Interacting but confused, still not back to baseline per wife at bedside 2/15 Stool-like produced from in-out urinary cath 2/16 Urology consult, confirmed pyocystitis and drained bladder 2/22 out of ICU to [facility] on 2/23 2/25 PCCM re-consult given hypotension  Consults:  Nephrology 2/14 Urology 2/16 Hematology 2/22  Procedures:  2/14 left IJ HD cath insertion 2/16 Bedside bladder aspiration  Significant Diagnostic Tests:  2/14 CXR>>Cardiomegaly.  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  Chronic elevation of the left hemidiaphragm with chronic volume loss at the left lung base.  2/14 CT Abd 1. No acute pulmonary embolism. 2. Small bilateral pleural effusions with adjacent atelectasis. 3. Dilated main pulmonary artery, which may be secondary to pulmonary arterial hypertension. 4. Interval development of wedge-shaped defects involving the spleen, which may represent splenic infarcts. 5. Cholelithiasis with mild gallbladder wall thickening and pericholecystic free fluid, which may be secondary to underlying liver disease. If there is clinical concern for acute cholecystitis, follow-up with ultrasound is recommended. 6. 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 7. 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 8. Additional chronic findings as detailed above. 9. Aortic Atherosclerosis (ICD10-I70.0).  2/16 CT Pelvis with Rectal contrast 1. Negative for colovesicular fistula. Rectal  contrast only opacifies the colon.  2. Bladder wall thickening likely from chronic outlet obstruction. The prostate is enlarged. 3. Prominent anasarca.  CT Head 2/14 No acute intracranial finding. Atrophy. Chronic small-vessel ischemic changes. 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  Echo 2/14 . Left ventricular ejection fraction, by estimation, is 60 to 65%. The  left ventricle has normal function. The left ventricle has no regional  wall motion abnormalities. Left ventricular diastolic parameters are  indeterminate.   2. Right ventricular systolic function is normal. The right ventricular  size is normal.   3. Left atrial size was severely dilated.   4. Right atrial size was mildly dilated.   5. The mitral valve is degenerative. Mild mitral valve regurgitation. No  evidence of mitral stenosis.   6. The aortic valve is tricuspid. Aortic valve regurgitation is mild to  moderate. Mild aortic valve stenosis.   7. The inferior vena cava is normal in size with greater than 50%  respiratory variability, suggesting right atrial pressure of 3 mmHg.   RUQ U/S 2/15 1. Both the gallbladder and the CBD appear mildly distended with sludge but no stone identified. No intrahepatic biliary ductal dilatation to strongly suggest acute bile duct obstruction. Some gallbladder wall thickening, although no sonographic Murphy sign elicited to strongly suggest acalculous cholecystitis. 2. If abdominal pain continues then a nuclear medicine hepatobiliary scan would be valuable to exclude acute CBD or cystic duct obstruction.  Micro Data:  2/14 respiratory viral panel>> negative 2/14 blood cultures x2>> Citrobacter, sensitivities below 2/15 UCx Citrobacter pansensitive 2/24 UC >>  Antimicrobials:  Flagyl 2/14 only Cefepime 2/14-d/c 2/15 Vancomycin 2/14-d/c2/15 Meropenem 2/15  -> d/c 2/16 Ceftriaxone 2/16 -->2/22 Cefazolin 2/23 -->  Interim history/subjective:  RN and staff patient was more interactive this morning but still lethargic eating breakfast and then became more lethargic and hypotensive.   CBG in the 80's Patient without any complaints- denies pain, CP, SOB, N/V, dizziness  Objective   Blood pressure (!) 95/52, pulse 89, temperature (!) 97.4 F (36.3 C), temperature source Oral, resp. rate 16, height 6\' 4"  (1.93 m), weight 76.3 kg, SpO2 100 %.        Intake/Output Summary (Last 24 hours) at 06/30/2019 1136 Last data filed at 06/30/2019 0200 Gross per 24 hour  Intake 470.07 ml  Output --  Net 470.07 ml   Filed Weights   06/29/19 0746 06/29/19 1100 06/30/19 0451  Weight: 75.9 kg 75.9 kg 76.3 kg   General:  Elderly cachetic male in NAD supine in bed HEENT: MM pink/moist, pupils 4/reactive Neuro: Lethargic, will respond and answer with short responses, non focal, severe generalized weakness CV: irir, Afib- rate controlled, weak peripheral pulses, left IJ trialysis catheter, RUE AVF PULM:  Non labored, CTA, no hypoxia GI: soft, ND, NT, +bs Extremities: warm/dry, LUE +2 edema, BLE +1-2 edema with venous changes Skin: diffuse bruising to LUE  Resolved Hospital Problem list   Septic Shock 2/2 to Citrobacter bacteremia   Assessment & Plan:   Hypotension - ddx - recurrent sepsis, cardiac event, doubtful for PE given lack of hypoxia - patient has no specific complaints - on lab review, BNP down, WBC trending down, afebrile, Hgb stable P:  S/p NS 250 ml bolus x 2 and albumin without improvement tx to ICU  Starting neosynephrine for MAP goal > 60 CBG in the 80's, given 1/2 amp D50 without any improvement Continue midodrine 10  mg TID Continue stress dose steroids  Checking H/H, lactic, EKG, and troponin hs x 3 Insert foley to look for pus/ re- accumlation CT abd/ pelvis to look for abscess/ source Re-pan culture Continue ancef for  now Hold flomax, may potentate hypotension  ESRD - per Nephrology  - last iHD 8/54  Acute Metabolic encephalopathy - waxes/ wanes, CBG ok, has not received any sedating medicines, non focal on exam,  likely more related to blood pressure  Severe thrombocytopenia - persistent, trend on CBC - Hematology consulted 2/22, likely contributed to sepsis, overall not concerning for TTP/ HUS  - will check to rule out DVT in extremities to rule out consumption, noted elevating d-dimer - transfuse for < 10K or if bleeding  - recommended further workup of left kidney mass per Hematology when able  Afib/RVR - currently rate control, will resume amio gtt when PIV access established - Mag 2gm x 1   Protein calorie malnutrition, dysphagia - Dysphagia 1 diet, encourage PO intake, has been poor thus far, PMT consulted to help with GOC  Celiac and superior mesenteric artery aneurysms - On CT scanning 05/2019 aneurysmal dilatation of bilateral common iliac arteries again noted. Left measures 3.5 cm in diameter, R 3.3 cm.  - Follows with vascular OP  - check H/H for completeness, no abd pain or discomfort  Possible renal cell cancer - Will need outpatient follow-up  Best practice:  Diet: Dysphagia 1 diet Pain/Anxiety/Delirium protocol (if indicated): N/A VAP protocol (if indicated): N/A DVT prophylaxis: SCDs GI prophylaxis: PPI Glucose control: CBG q 4 till stable Mobility: Bedrest Code Status: Full code, verified with wife Family Communication: Mickel Baas, wife, called [phone]) and updated on condition change Disposition: tx to ICU    CCT 60 mins  Kennieth Rad, MSN, AGACNP-BC  Pulmonary & Critical Care 06/30/2019, 1:09 PM

## 2019-06-30 NOTE — Progress Notes (Signed)
  Speech Language Pathology Treatment: Dysphagia  Patient Details Name: Gregory [name] MRN: [id number] DOB: 09-Mar-1951 Today's Date: 06/30/2019 Time: 0300-9233 SLP Time Calculation (min) (ACUTE ONLY): 22 min  Assessment / Plan / Recommendation Clinical Impression  Pt was seen for dysphagia treatment and was cooperative throughout the session. He was notably more communicative during today's session asking for clarification regarding this SLP's name and commenting on the nature of last night's meal. He was also able to recall the name provided throughout the session and reference other activities which have occurred today. Per the pt he consumed dinner without difficulty but the "[place] tasted like greens". He tolerated dysphagia 3 solids and thin liquids via straw without symptoms of oropharyngeal dysphagia. He consumed 4 oz of dual consistency boluses (i.e., tropical fruit cocktail) without symptoms of oropharyngeal dysphagia including signs of aspiration. Mastication time was functional. Bolus sizes were consistently large but no significant oral residue was noted. Breakfast arrived towards the end of the session and pt appeared motivated to eat. He independently self-fed and started the meal without hesitation/encouragement. It is recommended that the current diet be continued at this time considering his current tendency to consume large boluses. However, it is likely that he will be able to be upgraded to a regular diet during the next session if he continues to improve.    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  Work-up suggest sepsis without clear source.  CXR on 2/14 reported: "Cardiomegaly.  Pulmonary venous hypertension without  frank edema." No previously documented dysphagia. CXR 2/20: Borderline mild congestive heart failure, improved      SLP Plan  Continue with current plan of care       Recommendations  Diet recommendations: Dysphagia 3 (mechanical soft);Thin liquid Liquids provided via: Straw Medication Administration: Crushed with puree Supervision: Patient able to self feed;Staff to assist with self feeding;Full supervision/cueing for compensatory strategies Compensations: Small sips/bites;Follow solids with liquid Postural Changes and/or Swallow Maneuvers: Seated upright 90 degrees                Oral Care Recommendations: Oral care BID Follow up Recommendations: 24 hour supervision/assistance;Skilled Nursing facility SLP Visit Diagnosis: Dysphagia, oral phase (R13.11) Plan: Continue with current plan of care       Catheleen Langhorne I. Hardin Negus, [place], [place] Office number [phone] Pager [place] 06/30/2019, 9:22 AM

## 2019-06-30 NOTE — Progress Notes (Signed)
Subjective/Chief Complaint:  1 - End Stage Renal Disease / Near Anuria - on dialysis via left SCV cath for medical renal disease. CT 06/2019 with bilateral atrophic native kidneys w/o hydro. Makes <73mL urine per day with several days of foley 06/2019. Voids maybe once every 2-3 weeks at baseline.   2 - Complex Urinary Infection / Pyocystis / Sepsis - sepsis picture 06/2019, UCX pan-sensitive citrobacter, BCX 2/14 pan-sensitive citrobacter placed on ancef. CT 2/201 w/o hydro / GU absecess or fluid collections. Placed on merrem. Mildly distended bladder on imaging x several this admission. bedise foely and manual bladder aspiration 2/16 confims pyocystis (bladder essentially full of pus as he is nearly anuric). Foley subsequently removed as output nil.   3 -  Rule Out Colovesical Fistula  -  ?fecal material on hospital UA during admission 06/2019. CT 2/14 and rectal constrast CT 2/16 w/o gas in bladder or overt fistula. NO h/o pelvic radiaiton, inflammatory bowel disease, or GI cancer.Bedside aspiration 2/16 confrims pyocystis (mimmicked fecal material).    Today "Gregory [name]" is stable. No longer febrile. WBC continues to trend down.    Objective: Vital signs in last 24 hours: Temp:  [97.4 F (36.3 C)-98 F (36.7 C)] 97.4 F (36.3 C) (02/25 0900) Pulse Rate:  [70-93] 89 (02/25 0900) Resp:  [12-18] 16 (02/25 0451) BP: (95-127)/(38-54) 95/52 (02/25 0900) SpO2:  [98 %-100 %] 100 % (02/25 0900) Weight:  [75.9 kg-76.3 kg] 76.3 kg (02/25 0451) Last BM Date: 06/29/19  Intake/Output from previous day: 02/24 0701 - 02/25 0700 In: 470.1 [P.O.:120; I.V.:350.1] Out: 230  Intake/Output this shift: No intake/output data recorded.  Physical Exam  Constitutional:  Very ill and frail apperaing, but AO and pleasant.  HENT:  Head: Normocephalic.  Eyes: Pupils are equal, round, and reactive to light.  Cardiovascular:  Regular rate   Respiratory: Effort normal.  GI: Soft.  Genitourinary:    Penis  normal.   Musculoskeletal:        General: Normal range of motion.     Cervical back: Normal range of motion.    Lab Results:  Recent Labs    06/29/19 0428 06/30/19 0432  WBC 21.6* 16.8*  HGB 8.8* 8.8*  HCT 25.9* 25.7*  PLT 23* 21*   BMET Recent Labs    06/29/19 0428 06/30/19 0432  NA 139 133*  K 4.4 3.8  CL 103 97*  CO2 23 24  GLUCOSE 96 180*  BUN 66* 32*  CREATININE 4.26* 2.88*  CALCIUM 6.3* 6.5*   PT/INR No results for input(s): LABPROT, INR in the last 72 hours. ABG No results for input(s): PHART, HCO3 in the last 72 hours.  Invalid input(s): PCO2, PO2  Studies/Results: No results found.  Anti-infectives: Anti-infectives (From admission, onward)   Start     Dose/Rate Route Frequency Ordered Stop   06/28/19 0930  ceFAZolin (ANCEF) IVPB 1 g/50 mL premix     1 g 100 mL/hr over 30 Minutes Intravenous Every 24 hours 06/28/19 0924 07/03/19 0929   06/21/19 1400  cefTRIAXone (ROCEPHIN) 2 g in sodium chloride 0.9 % 100 mL IVPB  Status:  Discontinued     2 g 200 mL/hr over 30 Minutes Intravenous Every 24 hours 06/21/19 1002 06/28/19 0924   06/20/19 1200  vancomycin (VANCOCIN) IVPB 1000 mg/200 mL premix  Status:  Discontinued     1,000 mg 200 mL/hr over 60 Minutes Intravenous Every M-W-F (Hemodialysis) 06/19/19 0232 06/20/19 1109   06/20/19 1200  meropenem (MERREM) 1 g in  sodium chloride 0.9 % 100 mL IVPB  Status:  Discontinued     1 g 200 mL/hr over 30 Minutes Intravenous Every 8 hours 06/20/19 1112 06/21/19 1002   06/19/19 2200  ceFEPIme (MAXIPIME) 1 g in sodium chloride 0.9 % 100 mL IVPB  Status:  Discontinued     1 g 200 mL/hr over 30 Minutes Intravenous Every 24 hours 06/19/19 0232 06/20/19 1109   06/19/19 0230  vancomycin (VANCOREADY) IVPB 750 mg/150 mL     750 mg 150 mL/hr over 60 Minutes Intravenous  Once 06/19/19 0227 06/19/19 0435   06/22/2019 2330  ceFEPIme (MAXIPIME) 2 g in sodium chloride 0.9 % 100 mL IVPB     2 g 200 mL/hr over 30 Minutes  Intravenous  Once 06/09/2019 2329 06/19/19 0052   06/17/2019 2330  metroNIDAZOLE (FLAGYL) IVPB 500 mg     500 mg 100 mL/hr over 60 Minutes Intravenous  Once 06/06/2019 2329 06/19/19 0205   06/10/2019 2330  vancomycin (VANCOCIN) IVPB 1000 mg/200 mL premix     1,000 mg 200 mL/hr over 60 Minutes Intravenous  Once 06/07/2019 2329 06/19/19 0205      Assessment/Plan:  1 - End Stage Renal Disease - per nephrology. No indication for any sort of further renal drainage. I do NOT recommend I and o cathing or additional foley at this time as he is nearly anuric and urethral cathing has only lead to more problems (pain, bleeding).   2 - Complex Urinary Infection / Pyocystis / Sepsis - only modifiable factor on my eval is likely mild bladder outlet obstruction + pyocystis, now drained. Rec DC on tamsulosin.   3 -  Rule Out Colovesical Fistula  -  Very low suspcision by history and imaging. This was severe pyocystits mimmicking fecal material. .   Greatly appreciate all teams comangment of this man with significant comorbidity and very limited functional capacity and frakly very poor long term prognosis.   He has GU follow up arranged in outpatient setting.    Alexis Frock 06/30/2019

## 2019-06-30 NOTE — Progress Notes (Signed)
Left upper extremity venous duplex and bilateral lower extremity venous duplex has been completed. Preliminary results can be found in CV Proc through chart review.   06/30/19 4:16 PM Carlos Levering RVT

## 2019-06-30 NOTE — Progress Notes (Signed)
BP = 65/42 with manual cuff. States he is feeling weak. HOB lowered to 20 degrees. Dr Chana Bode notified via [place].

## 2019-06-30 NOTE — Progress Notes (Signed)
Critical Care MD into room. Plan for transfer to ICU 4N25. Report called to [employer], Therapist, sports.

## 2019-07-01 ENCOUNTER — Inpatient Hospital Stay (HOSPITAL_COMMUNITY): Payer: Medicare Other

## 2019-07-01 DIAGNOSIS — I959 Hypotension, unspecified: Secondary | ICD-10-CM

## 2019-07-01 LAB — BRAIN NATRIURETIC PEPTIDE: B Natriuretic Peptide: 1726.2 pg/mL — ABNORMAL HIGH (ref 0.0–100.0)

## 2019-07-01 LAB — CBC WITH DIFFERENTIAL/PLATELET
Abs Immature Granulocytes: 0.22 K/uL — ABNORMAL HIGH (ref 0.00–0.07)
Basophils Absolute: 0 K/uL (ref 0.0–0.1)
Basophils Relative: 0 %
Eosinophils Absolute: 0 K/uL (ref 0.0–0.5)
Eosinophils Relative: 0 %
HCT: 29.9 % — ABNORMAL LOW (ref 39.0–52.0)
Hemoglobin: 10.4 g/dL — ABNORMAL LOW (ref 13.0–17.0)
Immature Granulocytes: 1 %
Lymphocytes Relative: 2 %
Lymphs Abs: 0.4 K/uL — ABNORMAL LOW (ref 0.7–4.0)
MCH: 29.3 pg (ref 26.0–34.0)
MCHC: 34.8 g/dL (ref 30.0–36.0)
MCV: 84.2 fL (ref 80.0–100.0)
Monocytes Absolute: 1.3 K/uL — ABNORMAL HIGH (ref 0.1–1.0)
Monocytes Relative: 6 %
Neutro Abs: 17.5 K/uL — ABNORMAL HIGH (ref 1.7–7.7)
Neutrophils Relative %: 91 %
Platelets: 23 K/uL — CL (ref 150–400)
RBC: 3.55 MIL/uL — ABNORMAL LOW (ref 4.22–5.81)
RDW: 18.3 % — ABNORMAL HIGH (ref 11.5–15.5)
WBC: 19.5 K/uL — ABNORMAL HIGH (ref 4.0–10.5)
nRBC: 0 % (ref 0.0–0.2)

## 2019-07-01 LAB — RENAL FUNCTION PANEL
Albumin: 2.5 g/dL — ABNORMAL LOW (ref 3.5–5.0)
Anion gap: 13 (ref 5–15)
BUN: 43 mg/dL — ABNORMAL HIGH (ref 8–23)
CO2: 21 mmol/L — ABNORMAL LOW (ref 22–32)
Calcium: 6.8 mg/dL — ABNORMAL LOW (ref 8.9–10.3)
Chloride: 100 mmol/L (ref 98–111)
Creatinine, Ser: 3.88 mg/dL — ABNORMAL HIGH (ref 0.61–1.24)
GFR calc Af Amer: 17 mL/min — ABNORMAL LOW (ref >60)
GFR calc non Af Amer: 15 mL/min — ABNORMAL LOW (ref >60)
Glucose, Bld: 101 mg/dL — ABNORMAL HIGH (ref 70–99)
Phosphorus: 3.6 mg/dL (ref 2.5–4.6)
Potassium: 3.6 mmol/L (ref 3.5–5.1)
Sodium: 134 mmol/L — ABNORMAL LOW (ref 135–145)

## 2019-07-01 LAB — PROCALCITONIN: Procalcitonin: 6.76 ng/mL

## 2019-07-01 LAB — CORTISOL: Cortisol, Plasma: 18.7 ug/dL

## 2019-07-01 LAB — D-DIMER, QUANTITATIVE: D-Dimer, Quant: 12.81 ug{FEU}/mL — ABNORMAL HIGH (ref 0.00–0.50)

## 2019-07-01 LAB — PHOSPHORUS: Phosphorus: 3.6 mg/dL (ref 2.5–4.6)

## 2019-07-01 LAB — COMPREHENSIVE METABOLIC PANEL
ALT: 7 U/L (ref 0–44)
AST: 25 U/L (ref 15–41)
Albumin: 2.7 g/dL — ABNORMAL LOW (ref 3.5–5.0)
Alkaline Phosphatase: 80 U/L (ref 38–126)
Anion gap: 16 — ABNORMAL HIGH (ref 5–15)
BUN: 43 mg/dL — ABNORMAL HIGH (ref 8–23)
CO2: 20 mmol/L — ABNORMAL LOW (ref 22–32)
Calcium: 6.8 mg/dL — ABNORMAL LOW (ref 8.9–10.3)
Chloride: 101 mmol/L (ref 98–111)
Creatinine, Ser: 3.86 mg/dL — ABNORMAL HIGH (ref 0.61–1.24)
GFR calc Af Amer: 17 mL/min — ABNORMAL LOW (ref >60)
GFR calc non Af Amer: 15 mL/min — ABNORMAL LOW (ref >60)
Glucose, Bld: 89 mg/dL (ref 70–99)
Potassium: 4.1 mmol/L (ref 3.5–5.1)
Sodium: 137 mmol/L (ref 135–145)
Total Bilirubin: 0.9 mg/dL (ref 0.3–1.2)
Total Protein: 4.9 g/dL — ABNORMAL LOW (ref 6.5–8.1)

## 2019-07-01 LAB — GLUCOSE, CAPILLARY
Glucose-Capillary: 95 mg/dL (ref 70–99)
Glucose-Capillary: 90 mg/dL (ref 70–99)
Glucose-Capillary: 100 mg/dL — ABNORMAL HIGH (ref 70–99)
Glucose-Capillary: 89 mg/dL (ref 70–99)
Glucose-Capillary: 85 mg/dL (ref 70–99)

## 2019-07-01 LAB — HEPATITIS B CORE ANTIBODY, TOTAL: Hep B Core Total Ab: REACTIVE — AB

## 2019-07-01 LAB — C-REACTIVE PROTEIN: CRP: 8.3 mg/dL — ABNORMAL HIGH (ref <1.0)

## 2019-07-01 LAB — MAGNESIUM: Magnesium: 2.3 mg/dL (ref 1.7–2.4)

## 2019-07-01 LAB — HEPATITIS B SURFACE ANTIGEN: Hepatitis B Surface Ag: NONREACTIVE

## 2019-07-01 MED ORDER — RENA-VITE PO TABS
1.0000 | ORAL_TABLET | Freq: Every day | ORAL | Status: DC
  Administered 2019-07-01 – 2019-07-12 (×12): 1 via ORAL
  Filled 2019-07-01 (×13): qty 1

## 2019-07-01 MED ORDER — DEXTROSE 10 % IV SOLN: INTRAVENOUS | Status: DC

## 2019-07-01 MED ORDER — PRO-STAT SUGAR FREE PO LIQD
30.0000 mL | Freq: Three times a day (TID) | ORAL | Status: DC
  Administered 2019-07-02 – 2019-07-04 (×7): 30 mL
  Filled 2019-07-01 (×8): qty 30

## 2019-07-01 MED ORDER — AMIODARONE HCL 200 MG PO TABS
200.0000 mg | ORAL_TABLET | Freq: Two times a day (BID) | ORAL | Status: DC
  Administered 2019-07-01 – 2019-07-10 (×19): 200 mg via ORAL
  Filled 2019-07-01 (×20): qty 1

## 2019-07-01 MED ORDER — PHENYLEPHRINE HCL-NACL 20-0.9 MG/250ML-% IV SOLN
0.0000 ug/min | INTRAVENOUS | Status: DC
  Administered 2019-07-01: 200 ug/min via INTRAVENOUS
  Administered 2019-07-01: 01:00:00 175 ug/min via INTRAVENOUS
  Administered 2019-07-01: 15:00:00 340 ug/min via INTRAVENOUS
  Administered 2019-07-01: 22:00:00 180 ug/min via INTRAVENOUS
  Administered 2019-07-01: 19:00:00 200 ug/min via INTRAVENOUS
  Administered 2019-07-02: 05:00:00 165 ug/min via INTRAVENOUS
  Administered 2019-07-02: 20:00:00 110 ug/min via INTRAVENOUS
  Administered 2019-07-02: 165 ug/min via INTRAVENOUS
  Administered 2019-07-03: 110 ug/min via INTRAVENOUS
  Administered 2019-07-03: 100 ug/min via INTRAVENOUS
  Administered 2019-07-03: 120 ug/min via INTRAVENOUS
  Administered 2019-07-04: 30 ug/min via INTRAVENOUS
  Administered 2019-07-04: 120 ug/min via INTRAVENOUS
  Filled 2019-07-01 (×25): qty 250

## 2019-07-01 MED ORDER — METHYLPREDNISOLONE SODIUM SUCC 125 MG IJ SOLR
60.0000 mg | Freq: Four times a day (QID) | INTRAMUSCULAR | Status: DC
  Administered 2019-07-01 – 2019-07-02 (×3): 60 mg via INTRAVENOUS
  Filled 2019-07-01 (×3): qty 2

## 2019-07-01 MED ORDER — NEPRO/CARBSTEADY PO LIQD
1000.0000 mL | ORAL | Status: DC
  Administered 2019-07-02: 1000 mL
  Filled 2019-07-01 (×5): qty 1000

## 2019-07-01 NOTE — Progress Notes (Signed)
Palliative: [name], Gregory [name], is resting quietly in bed.  He greets me making and somewhat keeping eye contact.  He appears chronically ill and somewhat frail.  He is oriented, able to make his basic needs known.  There is no family at bedside at this time.  Gregory [name] is receiving hemodialysis right now.  He tells me that he is tolerating dialysis at this point.  We talk about his acute health problems, and he seems to understand the treatment plan.  Gregory [name] continues to want full scope/full code treatment.  We talked about some what if's and maybe's.  At this point Gregory [name] is agreeable to rehab if qualified.  Conference with CCM NP, and bedside nursing staff.  Plan: Continue full scope/full code treatment.   PMT to follow for further goal setting.  37 minutes Quinn Axe, NP Palliative Medicine Team Team Phone # [phone] Greater than 50% of this time was spent counseling and coordinating care related to the above assessment and plan.

## 2019-07-01 NOTE — Progress Notes (Signed)
[facility] Progress Note   Subjective:  Went to ICU yest for hypotension w/ BP 60's, pt lethargic.  Got IVF bolus and neo gtt. Abd CT didn't show much. Foley placed and no purulent drainage  Objective Vitals:   07/01/19 1045 07/01/19 1053 07/01/19 1100 07/01/19 1115  BP: (!) 85/48 (!) 88/49 (!) 100/57   Pulse: 98 97 (!) 103 89  Resp: 14 13 15 12   Temp:      TempSrc:      SpO2: 100% 100% 100% 100%  Weight:      Height:       Physical Exam General: NAD, much more awake/converstant today. Heart: RRR; no murmur Lungs: CTA anteriorly Abdomen: soft, non-tender Extremities: Trace BLE edema, 1+ LUE edema/IV in place Dialysis Access: AVF  Additional Objective Labs: Basic Metabolic Panel: Recent Labs  Lab 06/30/19 0432 07/01/19 0646 07/01/19 0745  NA 133* 137 134*  K 3.8 4.1 3.6  CL 97* 101 100  CO2 24 20* 21*  GLUCOSE 180* 89 101*  BUN 32* 43* 43*  CREATININE 2.88* 3.86* 3.88*  CALCIUM 6.5* 6.8* 6.8*  PHOS 2.9 3.6 3.6   Liver Function Tests: Recent Labs  Lab 06/29/19 0428 06/29/19 0428 06/30/19 0432 07/01/19 0646 07/01/19 0745  AST 25  --  20 25  --   ALT 29  --  16 7  --   ALKPHOS 79  --  70 80  --   BILITOT 0.6  --  0.9 0.9  --   PROT 4.4*  --  4.3* 4.9*  --   ALBUMIN 2.4*   < > 2.4* 2.7* 2.5*   < > = values in this interval not displayed.   CBC: Recent Labs  Lab 06/27/19 0259 06/27/19 1623 06/28/19 1945 06/28/19 1945 06/29/19 0428 06/29/19 0428 06/30/19 0432 06/30/19 1336 07/01/19 0646  WBC 19.8*  --  24.6*   < > 21.6*  --  16.8*  --  19.5*  NEUTROABS  --   --  23.0*   < > 19.9*  --  15.3*  --  17.5*  HGB 9.4*  --  9.0*   < > 8.8*   < > 8.8* 10.3* 10.4*  HCT 28.4*   < > 27.1*   < > 25.9*   < > 25.7* 30.1* 29.9*  MCV 89.9  --  88.9  --  88.7  --  85.7  --  84.2  PLT 11*   < > 27*   < > 23*  --  21*  --  23*   < > = values in this interval not displayed.   Blood Culture    Component Value Date/Time   SDES BLOOD LEFT HAND  06/30/2019 1400   SDES BLOOD LEFT HAND 06/30/2019 1400   SPECREQUEST  06/30/2019 1400    BOTTLES DRAWN AEROBIC ONLY Blood Culture adequate volume Performed at [facility] [address] [place], [place] [address]    SPECREQUEST  06/30/2019 1400    BOTTLES DRAWN AEROBIC ONLY Blood Culture results may not be optimal due to an inadequate volume of blood received in culture bottles Performed at [facility] [address] [place], [place] [address]    CULT NO GROWTH <12 HOURS 06/30/2019 1400   CULT NO GROWTH <12 HOURS 06/30/2019 1400   REPTSTATUS PENDING 06/30/2019 1400   REPTSTATUS PENDING 06/30/2019 1400   Medications: . sodium chloride Stopped (06/23/19 1406)  . sodium chloride Stopped (06/30/19 1303)  .  amiodarone Stopped (06/30/19 1050)  .  ceFAZolin (ANCEF) IV Stopped (06/30/19 1131)  . dextrose Stopped (06/29/19 1700)  . phenylephrine (NEO-SYNEPHRINE) Adult infusion 130 mcg/min (07/01/19 1018)   . amiodarone  150 mg Intravenous Once  . Chlorhexidine Gluconate Cloth  6 each Topical Q0600  . darbepoetin (ARANESP) injection - NON-DIALYSIS  60 mcg Subcutaneous Q Fri-1800  . famotidine  20 mg Oral Daily  . feeding supplement (NEPRO CARB STEADY)  237 mL Oral TID BM  . mouth rinse  15 mL Mouth Rinse BID  . midodrine  10 mg Oral TID WC    Dialysis Orders: [facility] MWF 3h 30min 400/800 73kg 2/3.5Ca bath P4 AVF Hep 3300 No VDRA/ESA  OP Labs 06/10/19: Hgb 12.5 Ca 8.0 Phos 3.9 Alb 3.4 PTH 1743  Assessment/Plan: 1. Hypotension - last nite, BP's dropped, now on pressors in ICU.  Repeat CT abd neg, foley placed w/o any purulent drainage. Back in ICU.  2. Citrobacter urosepsis/ pyocystis - was septic on admit, had distended bladder and thought to be stool, then seen by urologyanddx'd with pyocystis, s/p Foley drainage. On Abx day #13 (Ancef now) 3. Thrombocytopenia: Significant with nadir 9 > 23 today. SP 1U Platelets. No schistocytes on smear, haptoglobin neg,  suspect sepsis related. 4. ESRD: MWF HD. HadCRRT2/15 - 06/26/19. HD today, no UF  5. AMS: resolved. CT head negative, has aneurysmal dilatation of bilateral cervical ICAs. 6. Hypotension/volume: on pressors now. On midodrine and stress dose steroids as well  7. Anemia of ESRD: Hgb 8.8 - getting Aranesp 10mcg IV q Fri. 8. A-flutter: improved, per primary 9. Metabolic bone disease: Low Phos - binder held. Ca trending down - on sensipar. Raised dialysate to 3.5Ca bath.  Kelly Splinter, MD 07/01/2019, 12:14 PM

## 2019-07-01 NOTE — Progress Notes (Addendum)
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  He was transferred out of ICU and to [facility] on 2/23.  Called 2/25 for recurrent hypotension, not responsive to fluids/ albumin therefore was transferred back to ICU for vasopressor support.   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  His wife reports that he is normally alert and oriented, but seemed to be "spaced out" and was having a hard time answering questions.  He had no URI symptoms, fever, diarrhea, known ill contacts or obvious focal neuro deficits.  He missed dialysis on 2/13 and when he was not improving his wife called EMS.  Work-up in the ED revealed leukocytosis of 25k, thrombocytopenia, lactic acid of 3.2, bilirubin 2.4 with normal LFTs.  He received 3.5 L IV fluids and empiric cefepime, vancomycin, Flagyl after which his blood pressure remained borderline and mental status only slightly improved.   Patient will answer questions and mentions left-sided back pain, denies abdominal pain. CT head was ordered and PCCM consulted for admission.  Covid-19 negative.  Past Medical History   has a past medical history of Anemia in chronic kidney disease, Arthritis, CHF (congestive heart failure)  ([place]), Chronic back pain, End stage renal disease ([place]), GERD (gastroesophageal reflux disease), Gout, Heart failure with reduced ejection fraction ([place]), and Hypertension. celiac and superior mesenteric artery aneurysms  Significant Hospital Events   2/14 Admit to [facility] 2/14 He developed SVT, was hemodynamically unstable, treated initially with adenosine but ultimately required DCCV around 715. Has remained hypotensive, received albumin and now another 2 L IV fluid resuscitation (5 L total). Interacting but confused, still not back to baseline per wife at bedside 2/15 Stool-like produced from in-out urinary cath 2/16 Urology consult, confirmed pyocystitis and drained bladder 2/22 out of ICU to [facility] on 2/23 2/25 PCCM re-consult given hypotension  Consults:  Nephrology 2/14 Urology 2/16 Hematology 2/22 Palliative care 2/26   Procedures:  2/14 left IJ HD cath insertion >> 2/16 Bedside bladder aspiration  2/25 foley >> 2/26 (no output or pus noted) Significant Diagnostic Tests:  2/14 CXR>>Cardiomegaly.  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  Chronic elevation of the left hemidiaphragm with chronic volume loss at the left lung base.  2/14 CT Abd 1. No acute pulmonary embolism. 2. Small bilateral pleural effusions with adjacent atelectasis. 3. Dilated main pulmonary artery, which may be secondary to pulmonary arterial hypertension. 4. Interval development of wedge-shaped defects involving the spleen, which may represent splenic infarcts. 5. Cholelithiasis with mild gallbladder wall thickening and pericholecystic free fluid, which may be secondary to underlying liver disease. If there is clinical concern for acute cholecystitis, follow-up with ultrasound is recommended. 6. 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 7. 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 8.  Additional chronic findings as detailed above. 9. Aortic Atherosclerosis (ICD10-I70.0).  2/16 CT Pelvis with Rectal contrast 1. Negative for colovesicular fistula. Rectal contrast only opacifies the colon. 2. Bladder wall thickening likely from chronic outlet obstruction. The prostate is enlarged. 3. Prominent anasarca.  CT Head 2/14 No acute intracranial finding. Atrophy. Chronic small-vessel ischemic changes. 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  Echo 2/14 . Left ventricular ejection fraction, by estimation, is 60 to 65%. The  left ventricle has normal function. The left ventricle has no regional  wall motion abnormalities. Left ventricular diastolic parameters are  indeterminate.   2. Right ventricular systolic function is normal. The right ventricular  size is normal.   3. Left atrial size was severely dilated.   4. Right atrial size was mildly dilated.   5. The mitral valve is degenerative. Mild mitral valve regurgitation. No  evidence of mitral stenosis.   6. The aortic valve is tricuspid. Aortic valve regurgitation is mild to  moderate. Mild aortic valve stenosis.   7. The inferior vena cava is normal in size with greater than 50%  respiratory variability, suggesting right atrial pressure of 3 mmHg.   RUQ U/S 2/15 1. Both the gallbladder and the CBD appear mildly distended with sludge but no stone identified. No intrahepatic biliary ductal dilatation to strongly suggest acute bile duct obstruction. Some gallbladder wall thickening, although no sonographic Murphy sign elicited to strongly suggest acalculous cholecystitis. 2. If abdominal pain continues then a nuclear medicine hepatobiliary scan would be valuable to exclude acute CBD or cystic duct Obstruction.  2/25 BLE DVT US >> neg 2/25 LUE DVT US >> neg  2/25 CT abd/ pelvis >> 1. Small  bilateral pleural effusions. 2. Cardiomegaly with diffuse atherosclerosis of the coronary vessels. 3. Cholelithiasis without cholecystitis. 4. Bilateral renal atrophy consistent with end-stage renal disease. Stable indeterminate 1.2 cm lesion left kidney. 5. High attenuation material within the bladder lumen may be related to excretion of previously administered contrast. Please correlate with urinalysis. 6. Trace simple appearing pelvic free fluid. 7. Stable endoluminal stent graft within the aorta and bilateral common iliac arteries. Evaluation of the vascular lumen is limited without contrast. 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  Micro Data:  2/14 respiratory viral panel>> negative 2/14 blood cultures x2>> Citrobacter, sensitivities below 2/15 UCx Citrobacter pansensitive 2/25 UC >> 2/25 BCx2 >>  Antimicrobials:  Flagyl 2/14 only Cefepime 2/14-d/c 2/15 Vancomycin 2/14-d/c2/15 Meropenem 2/15 -> d/c 2/16 Ceftriaxone 2/16 -->2/22 Cefazolin 2/23 -->  Interim history/subjective:  Currently on iHD- plans to keep positive 400 ml Afebrile, WBC up 16.8- 19.5 Neo at 160 mcg/min Much more awake this morning, not complaining of anything, no appetite but not too cooperative     Objective   Blood pressure (!) 95/44, pulse 94, temperature (!) 97 F (36.1 C), temperature source Oral, resp. rate 13, height 6\' 4"  (1.93 m), weight 79.9 kg, SpO2 100 %.        Intake/Output Summary (Last 24 hours) at 07/01/2019 1053 Last data filed at 07/01/2019 1000 Gross per 24 hour  Intake 2346.28 ml  Output 0 ml  Net 2346.28 ml   Filed Weights   06/29/19 1100 06/30/19 0451 07/01/19 0700  Weight: 75.9 kg 76.3 kg 79.9 kg   General:  Elderly cachetic male lying in bed in no distress, much more awake, not very cooperative, keeps  asking to speak only to "the doctor" HEENT: MM pink/moist Neuro: Awake/ interactive, exam limited given patient's cooperativeness, MAE, generalized  weakness CV: irir, RUE AVF currently accessed, left IJ trialysis site wnl  PULM:  Non labored, on RA, CTA, shallow GI: soft, +bs, NT, dry foley  Extremities: warm/dry, +1-2 generalized edema, LE venous changes Skin: scattered bruising   Resolved Hospital Problem list   Septic Shock 2/2 to Citrobacter bacteremia   Assessment & Plan:   Hypotension -- undifferentiated, possible multifactorial sepsis vs dehydration vs missing midodrine doses  - EKG non acute, trop trend flat  - CT abd/ pelvis as above without obvious source of infection - H/H trend stable  - down trending PCT  P:  Positive balance per iHD Continue to wean Neo for MAP goal > 55 with intact mental status via trialysis catheter with pigtail.  When off pressors- ok with nephrology to remove as they are using AVF for access  Midodrine 10 mg TID  continue Foley - dry, no evidence of pus, will d/c  Follow cultures/ trend WBC/ fever curve Continue ancef Cortisol  39.8 on 2/14   ESRD P: per Nephrology, iHD today, kept +400 ml Trend renal labs  Acute Metabolic encephalopathy P:  Improved, monitor   Severe thrombocytopenia P:  Stable trend  Trend CBC Hematology following -  likely contributed to sepsis, overall not concerning for TTP/ HUS  [place] negative for DVTs Transfuse for < 10K or if bleeding  Recommended further workup of left kidney mass per Hematology when able  Afib/RVR - new onset, CHADSVASC2 score 3, not candidate for [facility] given severe thrombocytopenia  P:  Mag 2.3 Will change amio to PO, continues to be rate controlled   Protein calorie malnutrition, dysphagia P:   Dysphagia 1 diet, encourage PO intake, has been poor thus far  Celiac and superior mesenteric artery aneurysms P:  On CT scanning 05/2019 aneurysmal dilatation of bilateral common iliac arteries again noted. Left measures 3.5 cm in diameter, R 3.3 cm.  - Follows with vascular OP   Possible renal cell cancer P:  Will need outpatient  follow-up  Best practice:  Diet: Dysphagia 1 diet Pain/Anxiety/Delirium protocol (if indicated): N/A VAP protocol (if indicated): N/A DVT prophylaxis: SCDs GI prophylaxis: PPI Glucose control: CBG q 4 till stable Mobility: Bedrest Code Status:PMT consulted 2/26, appreciate, patient wishes to remain full code  Family Communication: Mickel Baas, wife, called [phone]) attempted to call house/ cell, no answer  Disposition: tx to ICU    CCT 40 mins  Kennieth Rad, MSN, AGACNP-BC [facility] 07/01/2019, 10:53 AM

## 2019-07-01 NOTE — Progress Notes (Signed)
eLink Physician-Brief Progress Note Patient Name: Gregory [name] DOB: 02-27-1951 MRN: [id number]   Date of Service  07/01/2019  HPI/Events of Note  Blood sugar 85 mg %, Pt is at high risk for hypoglycemia, he is currently NPO and on no glucose containing iv fluids.  eICU Interventions  D 10 W @ 30 ml/ hour ordered until enteral nutrition can begin later today.        Kerry Kass Vaughn Frieze 07/01/2019, 11:52 PM

## 2019-07-01 NOTE — Progress Notes (Addendum)
Nutrition Follow-up  DOCUMENTATION CODES:   Severe malnutrition in context of chronic illness  INTERVENTION:   -Nepro 1.8 cal @ 15m/hr, increase 155mhr Q8 until goal rate of 501mr (1,200m63ms reached via cortrak tube -30ml61m-stat TID -Add Renavite - ok per nephrology   Monitor magnesium and phosphorus every 12 hours x 4 occurances, MD to replete as needed, as pt is at risk for refeeding syndrome given prolonged poor PO intake.  Tube feeding regimen will provide 2,460 kcals, 142 grams of protein, 872ml 76m water   NUTRITION DIAGNOSIS:   Severe Malnutrition related to chronic illness(ESRD on HD) as evidenced by severe muscle depletion, severe fat depletion.  Ongoing.  GOAL:   Patient will meet greater than or equal to 90% of their needs  Not met.   MONITOR:   TF tolerance, PO intake, Supplement acceptance  REASON FOR ASSESSMENT:   Consult Poor PO  ASSESSMENT:   69 yo 36le admitted with septic shock from fulminant cystitis. PMH includes HTN, CHF, HF, GERD, ESRD on HD, anemia.  Pt discussed during ICU rounds and with RN.  Pt continues to eat very poorly, 5 % of meals. Per RN pt refuses Nepro shakes.  Palliative care consulted, pt prefers full scope of care.  Per CCM ok to place cortrak tube today with platelets > 20 and continued poor PO intake.   2/23 unable to get cortrak due to platelets of 9 2/24 pt upgraded to DYS3/thins diet  Pt had CRRT 2/15-2/21/21. Pt received HD today, net UF 0mL du15mo hypotension   Medications reviewed and include: Pepcid, Nepro TID Neo @ 140 mcg  Labs reviewed: corrected calcium 8 (L) BNP: 1726 (H)  Diet Order:   Diet Order            DIET DYS 3 Room service appropriate? No; Fluid consistency: Thin  Diet effective now              EDUCATION NEEDS:   Not appropriate for education at this time  Skin:  Skin Assessment: Reviewed RN Assessment  Last BM:  2/24  Height:   Ht Readings from Last 1 Encounters:   06/19/19 _0  (1.93 m)    Weight:   Wt Readings from Last 1 Encounters:  07/01/19 79.9 kg    BMI:  Body mass index is 21.44 kg/m.  Estimated Nutritional Needs:   Kcal:  2400-2600  Protein:  140-160 gm  Fluid:  1 L  Daijha Leggio P., RD, LDN, CNSC See AMiON for contact information

## 2019-07-01 NOTE — Progress Notes (Signed)
Physical Therapy Treatment Patient Details Name: Gregory [name] MRN: [id number] DOB: [birth year] Today's Date: 07/01/2019    History of Present Illness 69yo male with 2 days of lethargy and confusion/AMS. CTH negative, negative for PE, covid negative at admission. Admitted for sepsis workup, encephalopathy. PMH ESRD on HD, anemia, CHF, HTN, celiac and mesenteric artery aneurysms, iliac artery aneurysm s/p graft repair    PT Comments    Pt limited by hypotension this session, but agreeable to bedlevel exercises and EOB activity. All attempts to raise BP, including LE and UE exercises, trendelenburg bed positioning, and taking time at each new position, were unsuccessful at raising pt BP, RN notified. PT to continue to follow acutely.   Supine 82/51  Supine after BLE exercises 84/53  Supine after BUE exercises 79/43  Sitting EOB 58/44  Sitting EOB after BUE exercises 74/39  Supine 72/43     Follow Up Recommendations  CIR;Supervision/Assistance - 24 hour     Equipment Recommendations  Other (comment)(TBD/defer)    Recommendations for Other Services       Precautions / Restrictions Precautions Precautions: Fall Precaution Comments: watch HR Restrictions Weight Bearing Restrictions: No    Mobility  Bed Mobility Overal bed mobility: Needs Assistance Bed Mobility: Supine to Sit;Sit to Supine     Supine to sit: +2 for safety/equipment;HOB elevated;Min assist Sit to supine: +2 for physical assistance;+2 for safety/equipment;Max assist   General bed mobility comments: Min A +2 for bringing BLEs towards EOB and elevating trunk. Max A +2 for returning to supine for trunk and LE management, scooting up in bed with use of bed pads and boost function.  Transfers                 General transfer comment: Defer for safety  Ambulation/Gait                 Stairs             Wheelchair Mobility    Modified Rankin (Stroke Patients Only)       Balance  Overall balance assessment: Needs assistance Sitting-balance support: No upper extremity supported;Feet supported Sitting balance-Leahy Scale: Good                                      Cognition Arousal/Alertness: Awake/alert Behavior During Therapy: Flat affect Overall Cognitive Status: Impaired/Different from baseline Area of Impairment: Orientation;Attention;Memory;Following commands;Safety/judgement;Awareness;Problem solving                 Orientation Level: Disoriented to;Place;Time;Situation Current Attention Level: Sustained Memory: Decreased recall of precautions;Decreased short-term memory Following Commands: Follows one step commands with increased time;Follows one step commands consistently Safety/Judgement: Decreased awareness of safety;Decreased awareness of deficits Awareness: Intellectual Problem Solving: Slow processing;Decreased initiation;Difficulty sequencing;Requires verbal cues;Requires tactile cues General Comments: Pt very agreeable to participate in therapy despite fatigue.Requiring increased time and cues throughout due to decreased processing and problem solving      Exercises General Exercises - Upper Extremity Shoulder Flexion: AROM;Both;10 reps;Supine Elbow Flexion: AROM;Both;10 reps;Supine Elbow Extension: AROM;Both;10 reps;Supine Digit Composite Flexion: AROM;Both;10 reps;Supine Composite Extension: AROM;Both;10 reps;Supine General Exercises - Lower Extremity Ankle Circles/Pumps: AROM;Both;10 reps;Supine Short Arc Quad: AROM;10 reps;Supine;Both Long Arc Quad: AROM;Left;10 reps;Supine Heel Slides: AROM;Both;10 reps;Supine Hip ABduction/ADduction: AROM;Both;10 reps;Supine    General Comments General comments (skin integrity, edema, etc.): BP 80/50s throughotu session; notified RN. See note for details.  Pertinent Vitals/Pain Pain Assessment: Faces Faces Pain Scale: No hurt Pain Location: generalized during movement   Pain Descriptors / Indicators: Grimacing;Discomfort Pain Intervention(s): Monitored during session    Home Living                      Prior Function            PT Goals (current goals can now be found in the care plan section) Acute Rehab PT Goals Patient Stated Goal: go home PT Goal Formulation: With patient/family Time For Goal Achievement: 07/12/19 Potential to Achieve Goals: Fair Progress towards PT goals: Progressing toward goals    Frequency    Min 3X/week      PT Plan Current plan remains appropriate    Co-evaluation PT/OT/SLP Co-Evaluation/Treatment: Yes Reason for Co-Treatment: Necessary to address cognition/behavior during functional activity;For patient/therapist safety;To address functional/ADL transfers PT goals addressed during session: Mobility/safety with mobility;Balance;Strengthening/ROM OT goals addressed during session: ADL's and self-care      AM-PAC PT "6 Clicks" Mobility   Outcome Measure  Help needed turning from your back to your side while in a flat bed without using bedrails?: A Little Help needed moving from lying on your back to sitting on the side of a flat bed without using bedrails?: A Lot Help needed moving to and from a bed to a chair (including a wheelchair)?: A Lot Help needed standing up from a chair using your arms (e.g., wheelchair or bedside chair)?: Total Help needed to walk in hospital room?: Total Help needed climbing 3-5 steps with a railing? : Total 6 Click Score: 10    End of Session Equipment Utilized During Treatment: Gait belt Activity Tolerance: Patient tolerated treatment well Patient left: with call bell/phone within reach;in bed;with bed alarm set;with family/visitor present(bilat mittens) Nurse Communication: Mobility status PT Visit Diagnosis: Difficulty in walking, not elsewhere classified (R26.2);Muscle weakness (generalized) (M62.81)     Time: 4210-3128 PT Time Calculation (min) (ACUTE ONLY):  23 min  Charges:  $Therapeutic Exercise: 8-22 mins                    Oluwadamilare Tobler E, PT Acute Rehabilitation Services Pager [phone]  Office [phone]    Rosey Eide D Elonda Husky 07/01/2019, 5:32 PM

## 2019-07-01 NOTE — Progress Notes (Addendum)
Occupational Therapy Treatment Patient Details Name: Gregory [name] MRN: [id number] DOB: 1950/10/23 Today's Date: 07/01/2019    History of present illness 69yo male with 2 days of lethargy and confusion/AMS. CTH negative, negative for PE, covid negative at admission. Admitted for sepsis workup, encephalopathy. PMH ESRD on HD, anemia, CHF, HTN, celiac and mesenteric artery aneurysms, iliac artery aneurysm s/p graft repair   OT comments  Pt limited by BP this session, but very agreeable to participate in bed level exercises in attempt to elevate BP. Pt presenting decreased strength and requiring rest breaks during BUE/BLE AROM. Min-Mod A +2 for bed mobility. Notified RN of BP and swelling/warmth at left forearm. Recommend dc to CIR and will continue to follow acutely as admitted.   Orthostatic BPs   Supine 82/51  Supine after BLE exercises 84/53  Supine after BUE exercises 79/43  Sitting EOB 58/44  Sitting EOB after BUE exercises 74/39  Supine 72/43       Follow Up Recommendations  CIR;Supervision/Assistance - 24 hour    Equipment Recommendations  Other (comment)(TBD at next venue of care)    Recommendations for Other Services      Precautions / Restrictions Precautions Precautions: Fall Precaution Comments: watch HR Restrictions Weight Bearing Restrictions: No       Mobility Bed Mobility Overal bed mobility: Needs Assistance Bed Mobility: Supine to Sit     Supine to sit: +2 for safety/equipment;HOB elevated;Min assist Sit to supine: +2 for physical assistance;+2 for safety/equipment;Mod assist   General bed mobility comments: Min A +2 for bringing BLEs towards EOB and elevating trunk. Mod A +2 for returning to supine  Transfers                 General transfer comment: Defer for safety    Balance Overall balance assessment: Needs assistance Sitting-balance support: No upper extremity supported;Feet supported Sitting balance-Leahy Scale: Good                                      ADL either performed or assessed with clinical judgement   ADL Overall ADL's : Needs assistance/impaired                                       General ADL Comments: Focused session on exercises at bed level and BP.     Vision       Perception     Praxis      Cognition Arousal/Alertness: Awake/alert Behavior During Therapy: Flat affect Overall Cognitive Status: Impaired/Different from baseline Area of Impairment: Orientation;Attention;Memory;Following commands;Safety/judgement;Awareness;Problem solving                 Orientation Level: Disoriented to;Place;Time;Situation Current Attention Level: Sustained Memory: Decreased recall of precautions;Decreased short-term memory Following Commands: Follows one step commands with increased time;Follows one step commands consistently Safety/Judgement: Decreased awareness of safety;Decreased awareness of deficits Awareness: Intellectual Problem Solving: Slow processing;Decreased initiation;Difficulty sequencing;Requires verbal cues;Requires tactile cues General Comments: Pt very agreeable to participate in therapy despite fatigue.Requiring increased time and cues throughout due to decreased processing and problem solving        Exercises Exercises: General Upper Extremity;General Lower Extremity General Exercises - Upper Extremity Shoulder Flexion: AROM;Both;10 reps;Supine Elbow Flexion: AROM;Both;10 reps;Supine Elbow Extension: AROM;Both;10 reps;Supine Digit Composite Flexion: AROM;Both;10 reps;Supine Composite Extension: AROM;Both;10 reps;Supine General Exercises - Lower  Extremity Ankle Circles/Pumps: AROM;Both;10 reps;Supine Short Arc Quad: AROM;Left;10 reps;Supine Long Arc Quad: AROM;Left;10 reps;Supine Hip ABduction/ADduction: AROM;Both;10 reps;Supine   Shoulder Instructions       General Comments BP 80/50s throughotu session; notified RN. See note for  details.     Pertinent Vitals/ Pain       Pain Assessment: Faces Faces Pain Scale: No hurt Pain Location: generalized during movement  Pain Descriptors / Indicators: Grimacing;Discomfort Pain Intervention(s): Monitored during session;Limited activity within patient's tolerance;Repositioned  Home Living                                          Prior Functioning/Environment              Frequency  Min 2X/week        Progress Toward Goals  OT Goals(current goals can now be found in the care plan section)  Progress towards OT goals: Progressing toward goals  Acute Rehab OT Goals Patient Stated Goal: none stated OT Goal Formulation: Patient unable to participate in goal setting Time For Goal Achievement: 07/12/19 Potential to Achieve Goals: Good ADL Goals Pt Will Perform Grooming: with min assist;sitting Pt Will Perform Upper Body Bathing: with min assist;sitting Pt Will Perform Lower Body Dressing: with max assist;sitting/lateral leans;sit to/from stand Pt Will Transfer to Toilet: with +2 assist;bedside commode;stand pivot transfer Additional ADL Goal #1: Pt will complete bed mobility with min assist +2 and maintain dynamic sitting balance with supervision as precursor to ADLs. Additional ADL Goal #2: Patient will follow 1 step commands with 75% accuracy with increased time and attend to tasks with no more than minimal redirection required.  Plan Discharge plan remains appropriate    Co-evaluation    PT/OT/SLP Co-Evaluation/Treatment: Yes Reason for Co-Treatment: Necessary to address cognition/behavior during functional activity;For patient/therapist safety;To address functional/ADL transfers;Other (comment)(BP)   OT goals addressed during session: ADL's and self-care      AM-PAC OT "6 Clicks" Daily Activity     Outcome Measure   Help from another person eating meals?: Total Help from another person taking care of personal grooming?: Total Help  from another person toileting, which includes using toliet, bedpan, or urinal?: Total Help from another person bathing (including washing, rinsing, drying)?: Total Help from another person to put on and taking off regular upper body clothing?: Total Help from another person to put on and taking off regular lower body clothing?: Total 6 Click Score: 6    End of Session    OT Visit Diagnosis: Other abnormalities of gait and mobility (R26.89);Muscle weakness (generalized) (M62.81);Other symptoms and signs involving cognitive function   Activity Tolerance Patient tolerated treatment well;Other (comment)(Limited BP)   Patient Left in bed;with call bell/phone within reach;with family/visitor present;with nursing/sitter in room   Nurse Communication Mobility status;Precautions        Time: 8811-0315 OT Time Calculation (min): 29 min  Charges: OT General Charges $OT Visit: 1 Visit OT Treatments $Therapeutic Activity: 8-22 mins  [place], OTR/L Acute Rehab Pager: [phone] Office: [place] 07/01/2019, 4:50 PM

## 2019-07-01 NOTE — Progress Notes (Signed)
Inpatient Rehabilitation Admissions Coordinator  Patient not a candidate for CIR. We will signoff at this time.  Danne Baxter, RN, MSN Rehab Admissions Coordinator [phone] 07/01/2019 5:48 PM

## 2019-07-01 NOTE — Procedures (Signed)
Cortrak  Person Inserting Tube:  Jacklynn Barnacle E, RD Tube Type:  Cortrak - 43 inches Tube Location:  Left nare Initial Placement:  Stomach Secured by: Bridle Technique Used to Measure Tube Placement:  Documented cm marking at nare/ corner of mouth Cortrak Secured At:  69 cm    Cortrak Tube Team Note:  Consult received to place a Cortrak feeding tube.   No x-ray is required. RN may begin using tube.    If the tube becomes dislodged please keep the tube and contact the Cortrak team at [email or web address] (password [email or web address]) for replacement.  If after hours and replacement cannot be delayed, place a NG tube and confirm placement with an abdominal x-ray.    Jacklynn Barnacle, MS, RD, LDN Pager number available on Amion

## 2019-07-02 LAB — GLUCOSE, CAPILLARY
Glucose-Capillary: 111 mg/dL — ABNORMAL HIGH (ref 70–99)
Glucose-Capillary: 124 mg/dL — ABNORMAL HIGH (ref 70–99)
Glucose-Capillary: 154 mg/dL — ABNORMAL HIGH (ref 70–99)
Glucose-Capillary: 128 mg/dL — ABNORMAL HIGH (ref 70–99)
Glucose-Capillary: 139 mg/dL — ABNORMAL HIGH (ref 70–99)
Glucose-Capillary: 150 mg/dL — ABNORMAL HIGH (ref 70–99)

## 2019-07-02 LAB — HEPATITIS B SURFACE ANTIBODY, QUANTITATIVE: Hep B S AB Quant (Post): <3.1 m[IU]/mL — ABNORMAL LOW (ref >9.9)

## 2019-07-02 MED ORDER — PHENOL 1.4 % MT LIQD
1.0000 | OROMUCOSAL | Status: DC | PRN
  Administered 2019-07-02 – 2019-07-03 (×2): 1 via OROMUCOSAL
  Filled 2019-07-02: qty 177

## 2019-07-02 MED ORDER — MIDODRINE HCL 5 MG PO TABS
20.0000 mg | ORAL_TABLET | Freq: Three times a day (TID) | ORAL | Status: DC
  Administered 2019-07-02 – 2019-07-14 (×37): 20 mg via ORAL
  Filled 2019-07-02 (×40): qty 4

## 2019-07-02 MED ORDER — HYDROCORTISONE 10 MG PO TABS
10.0000 mg | ORAL_TABLET | Freq: Every morning | ORAL | Status: DC
  Administered 2019-07-03 – 2019-07-14 (×11): 10 mg via ORAL
  Filled 2019-07-02 (×13): qty 1

## 2019-07-02 MED ORDER — TRAZODONE HCL 50 MG PO TABS
50.0000 mg | ORAL_TABLET | Freq: Every evening | ORAL | Status: DC | PRN
  Administered 2019-07-02: 50 mg via ORAL
  Filled 2019-07-02 (×2): qty 1

## 2019-07-02 MED ORDER — HYDROCORTISONE 20 MG PO TABS
20.0000 mg | ORAL_TABLET | Freq: Every evening | ORAL | Status: DC
  Administered 2019-07-02 – 2019-07-11 (×10): 20 mg via ORAL
  Filled 2019-07-02 (×12): qty 1

## 2019-07-02 NOTE — Progress Notes (Signed)
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  He was transferred out of ICU and to [facility] on 2/23.  Called 2/25 for recurrent hypotension, not responsive to fluids/ albumin therefore was transferred back to ICU for vasopressor support.   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  His wife reports that he is normally alert and oriented, but seemed to be "spaced out" and was having a hard time answering questions.  He had no URI symptoms, fever, diarrhea, known ill contacts or obvious focal neuro deficits.  He missed dialysis on 2/13 and when he was not improving his wife called EMS.  Work-up in the ED revealed leukocytosis of 25k, thrombocytopenia, lactic acid of 3.2, bilirubin 2.4 with normal LFTs.  He received 3.5 L IV fluids and empiric cefepime, vancomycin, Flagyl after which his blood pressure remained borderline and mental status only slightly improved.   Patient will answer questions and mentions left-sided back pain, denies abdominal pain. CT head was ordered and PCCM consulted for admission.  Covid-19 negative.  Past Medical History   has a past medical history of Anemia in chronic kidney disease, Arthritis, CHF (congestive heart failure)  ([place]), Chronic back pain, End stage renal disease ([place]), GERD (gastroesophageal reflux disease), Gout, Heart failure with reduced ejection fraction ([place]), and Hypertension. celiac and superior mesenteric artery aneurysms  Significant Hospital Events   2/14 Admit to [facility] 2/14 He developed SVT, was hemodynamically unstable, treated initially with adenosine but ultimately required DCCV around 715. Has remained hypotensive, received albumin and now another 2 L IV fluid resuscitation (5 L total). Interacting but confused, still not back to baseline per wife at bedside 2/15 Stool-like produced from in-out urinary cath 2/16 Urology consult, confirmed pyocystitis and drained bladder 2/22 out of ICU to [facility] on 2/23 2/25 PCCM re-consult given hypotension 2/26 Remains on vasopressors support   Consults:  Nephrology 2/14 Urology 2/16 Hematology 2/22 Palliative care 2/26   Procedures:  2/14 left IJ HD cath insertion >> 2/16 Bedside bladder aspiration  2/25 foley >> 2/26 (no output or pus noted)  Significant Diagnostic Tests:  2/14 CXR>>Cardiomegaly.  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 Chronic elevation of the left hemidiaphragm with chronic volume loss at the left lung base.  2/14 CT Abd 1. No acute pulmonary embolism. 2. Small bilateral pleural effusions with adjacent atelectasis. 3. Dilated main pulmonary artery, which may be secondary to pulmonary arterial hypertension. 4. Interval development of wedge-shaped defects involving the spleen, which may represent splenic infarcts. 5. Cholelithiasis with mild gallbladder wall thickening and pericholecystic free fluid, which may be secondary to underlying liver disease. If there is clinical concern for acute cholecystitis, follow-up with ultrasound is recommended. 6. 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 7. Status post endovascular repair of bilateral common iliac artery aneurysms with stable  appearance of the hardware. 8. Additional chronic findings as detailed above. 9. Aortic Atherosclerosis (ICD10-I70.0).  2/16 CT Pelvis with Rectal contrast 1. Negative for colovesicular fistula. Rectal contrast only opacifies the colon. 2. Bladder wall thickening likely from chronic outlet obstruction. The prostate is enlarged. 3. Prominent anasarca.  CT Head 2/14 No acute intracranial finding. Atrophy. Chronic small-vessel ischemic changes. 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  Echo 2/14 . Left ventricular ejection fraction, by estimation, is 60 to 65%. The  left ventricle has normal function. The left ventricle has no regional  wall motion abnormalities. Left ventricular diastolic parameters are  indeterminate.   2. Right ventricular systolic function is normal. The right ventricular  size is normal.   3. Left atrial size was severely dilated.   4. Right atrial size was mildly dilated.   5. The mitral valve is degenerative. Mild mitral valve regurgitation. No  evidence of mitral stenosis.   6. The aortic valve is tricuspid. Aortic valve regurgitation is mild to  moderate. Mild aortic valve stenosis.   7. The inferior vena cava is normal in size with greater than 50%  respiratory variability, suggesting right atrial pressure of 3 mmHg.   RUQ U/S 2/15 1. Both the gallbladder and the CBD appear mildly distended with sludge but no stone identified. No intrahepatic biliary ductal dilatation to strongly suggest acute bile duct obstruction. Some gallbladder wall thickening, although no sonographic Murphy sign elicited to strongly suggest acalculous cholecystitis. 2. If abdominal pain continues then a nuclear medicine hepatobiliary scan would be valuable to exclude acute CBD or cystic duct Obstruction.  2/25 BLE DVT US >> neg 2/25 LUE DVT US >> neg  2/25  CT abd/ pelvis >> 1. Small bilateral pleural effusions. 2. Cardiomegaly with diffuse atherosclerosis of the coronary vessels. 3. Cholelithiasis without cholecystitis. 4. Bilateral renal atrophy consistent with end-stage renal disease. Stable indeterminate 1.2 cm lesion left kidney. 5. High attenuation material within the bladder lumen may be related to excretion of previously administered contrast. Please correlate with urinalysis. 6. Trace simple appearing pelvic free fluid. 7. Stable endoluminal stent graft within the aorta and bilateral common iliac arteries. Evaluation of the vascular lumen is limited without contrast. 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  Micro Data:  2/14 respiratory viral panel>> negative 2/14 blood cultures x2>> Citrobacter, sensitivities below 2/15 UCx Citrobacter pansensitive 2/25 UC >> 2/25 BCx2 >>  Antimicrobials:  Flagyl 2/14 only Cefepime 2/14 > 2/15 Vancomycin 2/14 > 2/15 Meropenem 2/15 > 2/16 Ceftriaxone 2/16 > 2/22 Cefazolin 2/23 >  Interim history/subjective:  RN reports difficulty obtaining lab work due to decreased access, no acute events overnight. Remains on vasopressor  Objective   Blood pressure (!) 103/55, pulse 67, temperature 98.3 F (36.8 C), temperature source Oral, resp. rate 15, height 6\' 4"  (1.93 m), weight 81.9 kg, SpO2 95 %.        Intake/Output Summary (Last 24 hours) at 07/02/2019 1103 Last data filed at 07/02/2019 1000 Gross per 24 hour  Intake 4119.41 ml  Output 0 ml  Net 4119.41 ml   Filed Weights   06/30/19 0451 07/01/19 0700 07/02/19 0500  Weight: 76.3 kg 79.9 kg 81.9 kg   General: Chronically ill appearing elderly male lying in bed, in NAD HEENT: [place]/AT, MM pink/moist, PERRL,  Neuro: Alert and oriented, nonfocal CV: s1s2 regular rate and rhythm, no murmur,  rubs, or gallops,  PULM: Clear to auscultation bilaterally, no increased work of breathing, no added breath sounds GI: soft, bowel  sounds active in all 4 quadrants, non-tender, non-distended, tolerating TF Extremities: warm/dry, no edema  Skin: no rashes or lesions  Resolved Hospital Problem list   Septic Shock 2/2 to Citrobacter bacteremia   Assessment & Plan:   Hypotension -- undifferentiated, possible multifactorial sepsis vs dehydration vs missing midodrine doses  - EKG non acute, trop trend flat  - CT abd/ pelvis as above without obvious source of infection - H/H trend stable  - down trending PCT  P:  Positive fluid balance per IHD Continue to mean apnea as able  MAP goal greater than 55 Per nephrology okay to remove dialysis catheter once weaned off pressors AVF graft with positive bruit and thrill Continue midodrine Continue IV antibiotics Continue steroids  ESRD P: Nephrology following, IHD 2/26 Trend renal labs as able  Acute Metabolic encephalopathy P:  Improving, patient appears alert and oriented x3 today Mobilize as able Delirium precautions  Severe thrombocytopenia - likely contributed to sepsis, overall not concerning for TTP/ HUS  P:  Trend CBC Hemoglobin remained stable  Hematology following Ultrasound negative for DVTs Transfuse for platelets less than 10 or actively bleeding Recommended further work-up of left kidney mass per hematology when able  Afib/RVR - new onset, CHADSVASC2 score 3, not candidate for [facility] given severe thrombocytopenia  P:  Continue p.o. amiodarone Rate remains controlled Continuous telemetry Monitor and replete electrolytes as needed  Protein calorie malnutrition, dysphagia P:  Cleared for dysphagia 1 diet Poor oral intake due to limited appetite Encourage increased p.o. intake as able Core track with tube feeds in place  Celiac and superior mesenteric artery aneurysms -On CT scanning 05/2019 aneurysmal dilatation of bilateral common iliac arteries again noted. Left measures 3.5 cm in diameter, R 3.3 cm.  P:  Follow-up with vascular as an  outpatient  Possible renal cell cancer P:  Follow-up as an outpatient  Best practice:  Diet: Dysphagia 1 diet, supplemental tube feeds as well Pain/Anxiety/Delirium protocol (if indicated): N/A VAP protocol (if indicated): N/A DVT prophylaxis: SCDs GI prophylaxis: PPI Glucose control: CBG q 4 till stable Mobility: Bedrest Code Status:PMT consulted 2/26, appreciate, patient wishes to remain full code  Family Communication: Attempt again today 2/27 to update Mickel Baas, wife,   Disposition: tx to ICU   CRITICAL CARE Performed by: Johnsie Cancel   Total critical care time: 35 minutes  Critical care time was exclusive of separately billable procedures and treating other patients.  Critical care was necessary to treat or prevent imminent or life-threatening deterioration.  Critical care was time spent personally by me on the following activities: development of treatment plan with patient and/or surrogate as well as nursing, discussions with consultants, evaluation of patient's response to treatment, examination of patient, obtaining history from patient or surrogate, ordering and performing treatments and interventions, ordering and review of laboratory studies, ordering and review of radiographic studies, pulse oximetry and re-evaluation of patient's condition.  Johnsie Cancel, NP-C [facility] Contact / Pager information can be found on Amion  07/02/2019, 11:12 AM

## 2019-07-02 NOTE — Progress Notes (Signed)
[facility] Progress Note   Subjective:  Seen in room. Sleepy today. No CP/dyspnea. Remains on pressors.  Objective Vitals:   07/02/19 0700 07/02/19 0800 07/02/19 0900 07/02/19 1000  BP: 132/69 140/77 (!) 138/56 (!) 103/55  Pulse: 78 (!) 102 80 67  Resp: 13 14 14 15   Temp:  98.3 F (36.8 C)    TempSrc:  Oral    SpO2: 100% 99% 100% 95%  Weight:      Height:       Physical Exam General: Chronically ill appearing man, NAD Heart: RRR; 2/6 murmur Lungs: CTA anteriorly Abdomen: soft, non-tender Extremities: Trace BLE edema, 1+ LUE edema Dialysis Access: R AVG + thrill  Additional Objective Labs: Basic Metabolic Panel: Recent Labs  Lab 06/30/19 0432 07/01/19 0646 07/01/19 0745  NA 133* 137 134*  K 3.8 4.1 3.6  CL 97* 101 100  CO2 24 20* 21*  GLUCOSE 180* 89 101*  BUN 32* 43* 43*  CREATININE 2.88* 3.86* 3.88*  CALCIUM 6.5* 6.8* 6.8*  PHOS 2.9 3.6 3.6   Liver Function Tests: Recent Labs  Lab 06/29/19 0428 06/29/19 0428 06/30/19 0432 07/01/19 0646 07/01/19 0745  AST 25  --  20 25  --   ALT 29  --  16 7  --   ALKPHOS 79  --  70 80  --   BILITOT 0.6  --  0.9 0.9  --   PROT 4.4*  --  4.3* 4.9*  --   ALBUMIN 2.4*   < > 2.4* 2.7* 2.5*   < > = values in this interval not displayed.   CBC: Recent Labs  Lab 06/27/19 0259 06/27/19 1623 06/28/19 1945 06/28/19 1945 06/29/19 0428 06/29/19 0428 06/30/19 0432 06/30/19 1336 07/01/19 0646  WBC 19.8*  --  24.6*   < > 21.6*  --  16.8*  --  19.5*  NEUTROABS  --   --  23.0*   < > 19.9*  --  15.3*  --  17.5*  HGB 9.4*  --  9.0*   < > 8.8*   < > 8.8* 10.3* 10.4*  HCT 28.4*   < > 27.1*   < > 25.9*   < > 25.7* 30.1* 29.9*  MCV 89.9  --  88.9  --  88.7  --  85.7  --  84.2  PLT 11*   < > 27*   < > 23*  --  21*  --  23*   < > = values in this interval not displayed.   Blood Culture    Component Value Date/Time   SDES BLOOD LEFT HAND 06/30/2019 1400   SDES BLOOD LEFT HAND 06/30/2019 1400   SPECREQUEST   06/30/2019 1400    BOTTLES DRAWN AEROBIC ONLY Blood Culture adequate volume   SPECREQUEST  06/30/2019 1400    BOTTLES DRAWN AEROBIC ONLY Blood Culture results may not be optimal due to an inadequate volume of blood received in culture bottles   CULT  06/30/2019 1400    NO GROWTH 2 DAYS Performed at [facility], [place] [address] [place], [place] [address]    CULT  06/30/2019 1400    NO GROWTH 2 DAYS Performed at [facility] [address] [place], [place] [address]    REPTSTATUS PENDING 06/30/2019 1400   REPTSTATUS PENDING 06/30/2019 1400   Studies/Results: CT ABDOMEN PELVIS WO CONTRAST  Result Date: 06/30/2019 CLINICAL DATA:  Pyo cystitis, sepsis EXAM: CT ABDOMEN AND PELVIS WITHOUT CONTRAST TECHNIQUE:  Multidetector CT imaging of the abdomen and pelvis was performed following the standard protocol without IV contrast. COMPARISON:  06/21/2019 FINDINGS: Lower chest: There are small bilateral pleural effusions. Minimal compressive lower lobe atelectasis bilaterally. Heart is enlarged without pericardial effusion. Prominent calcification of the mitral annulus with diffuse atherosclerosis of the coronary vessels. Hepatobiliary: Calcified gallstones are identified without cholecystitis. No focal liver abnormalities. No ductal dilation. Pancreas: Stable chronic pancreatic duct dilation. The pancreas is otherwise unremarkable. Spleen: Stable hypodensity posteromedial spleen which may reflect infarct based on previous imaging. Adrenals/Urinary Tract: Bilateral renal atrophy consistent with end-stage renal disease. Stable right renal cyst. Stable indeterminate 1.2 cm lesion left kidney image 36. Foley catheter is in place. High attenuation material within the bladder lumen, could be related to excretion of previously administered contrast. Please correlate with urinalysis. The adrenals are stable. Stomach/Bowel: No bowel obstruction or ileus. Normal appendix right lower quadrant. No bowel wall  thickening or inflammatory changes. Vascular/Lymphatic: Stable endoluminal stent graft within the aorta and bilateral common iliac arteries. Stable stents extending into the right external and right internal iliac arteries. Evaluation of the vascular lumen is limited without contrast. 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 There is diffuse atherosclerosis. No pathologic adenopathy. Reproductive: Stable mild enlargement of the prostate. Other: Trace simple appearing pelvic free fluid. There is mild diffuse mesenteric edema. No free gas within the abdomen. Musculoskeletal: No acute or destructive bony lesions. Stable multilevel lumbar spondylosis. Reconstructed images demonstrate no additional findings. IMPRESSION: 1. Small bilateral pleural effusions. 2. Cardiomegaly with diffuse atherosclerosis of the coronary vessels. 3. Cholelithiasis without cholecystitis. 4. Bilateral renal atrophy consistent with end-stage renal disease. Stable indeterminate 1.2 cm lesion left kidney. 5. High attenuation material within the bladder lumen may be related to excretion of previously administered contrast. Please correlate with urinalysis. 6. Trace simple appearing pelvic free fluid. 7. Stable endoluminal stent graft within the aorta and bilateral common iliac arteries. Evaluation of the vascular lumen is limited without contrast. 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 Electronically Signed   By: Randa Ngo M.D.   On: 06/30/2019 19:01   DG Abd 1 View  Result Date: 07/01/2019 CLINICAL DATA:  Enteric catheter placement EXAM: ABDOMEN - 1 VIEW COMPARISON:  11/08/2014, 06/30/2019 FINDINGS: Frontal view of the lower chest and upper abdomen was performed. Enteric catheter tip projects over gastric body. Bowel gas pattern is unremarkable and stable. Endoluminal aortic stent graft again identified. Persistent left basilar consolidation and effusion. Diffuse calcification of the mitral  annulus. IMPRESSION: 1. Enteric catheter overlying gastric body. Electronically Signed   By: Randa Ngo M.D.   On: 07/01/2019 19:04   VAS [place] LOWER EXTREMITY VENOUS (DVT)  Result Date: 06/30/2019  Lower Venous DVTStudy Indications: Elevated Ddimer.  Risk Factors: None identified. Comparison Study: No prior studies. Performing Technologist: Oliver Hum RVT  Examination Guidelines: A complete evaluation includes B-mode imaging, spectral Doppler, color Doppler, and power Doppler as needed of all accessible portions of each vessel. Bilateral testing is considered an integral part of a complete examination. Limited examinations for reoccurring indications may be performed as noted. The reflux portion of the exam is performed with the patient in reverse Trendelenburg.  +---------+---------------+---------+-----------+----------+--------------+ RIGHT    CompressibilityPhasicitySpontaneityPropertiesThrombus Aging +---------+---------------+---------+-----------+----------+--------------+ CFV      Full           Yes      Yes                                 +---------+---------------+---------+-----------+----------+--------------+  SFJ      Full                                                        +---------+---------------+---------+-----------+----------+--------------+ FV Prox  Full                                                        +---------+---------------+---------+-----------+----------+--------------+ FV Mid   Full                                                        +---------+---------------+---------+-----------+----------+--------------+ FV DistalFull                                                        +---------+---------------+---------+-----------+----------+--------------+ PFV      Full                                                        +---------+---------------+---------+-----------+----------+--------------+ POP      Full            Yes      Yes                                 +---------+---------------+---------+-----------+----------+--------------+ PTV      Full                                                        +---------+---------------+---------+-----------+----------+--------------+ PERO     Full                                                        +---------+---------------+---------+-----------+----------+--------------+   +---------+---------------+---------+-----------+----------+--------------+ LEFT     CompressibilityPhasicitySpontaneityPropertiesThrombus Aging +---------+---------------+---------+-----------+----------+--------------+ CFV      Full           Yes      Yes                                 +---------+---------------+---------+-----------+----------+--------------+ SFJ      Full                                                        +---------+---------------+---------+-----------+----------+--------------+  FV Prox  Full                                                        +---------+---------------+---------+-----------+----------+--------------+ FV Mid   Full                                                        +---------+---------------+---------+-----------+----------+--------------+ FV DistalFull                                                        +---------+---------------+---------+-----------+----------+--------------+ PFV      Full                                                        +---------+---------------+---------+-----------+----------+--------------+ POP      Full           Yes      Yes                                 +---------+---------------+---------+-----------+----------+--------------+ PTV      Full                                                        +---------+---------------+---------+-----------+----------+--------------+ PERO     Full                                                         +---------+---------------+---------+-----------+----------+--------------+     Summary: RIGHT: - There is no evidence of deep vein thrombosis in the lower extremity.  - A cystic structure is found in the popliteal fossa.  LEFT: - There is no evidence of deep vein thrombosis in the lower extremity.  - A cystic structure is found in the popliteal fossa.  *See table(s) above for measurements and observations. Electronically signed by Monica Martinez MD on 06/30/2019 at 7:08:24 PM.    Final    VAS [place] UPPER EXTREMITY VENOUS DUPLEX  Result Date: 06/30/2019 UPPER VENOUS STUDY  Indications: Swelling Risk Factors: Right sided dialysis access. Limitations: Poor ultrasound/tissue interface. Comparison Study: No prior studies. Performing Technologist: Oliver Hum RVT  Examination Guidelines: A complete evaluation includes B-mode imaging, spectral Doppler, color Doppler, and power Doppler as needed of all accessible portions of each vessel. Bilateral testing is considered an integral part of a complete examination. Limited examinations for reoccurring indications may be performed as noted.  Right Findings: +----------+------------+---------+-----------+----------+-------+ RIGHT     CompressiblePhasicitySpontaneousPropertiesSummary +----------+------------+---------+-----------+----------+-------+  Subclavian    Full       Yes       Yes                      +----------+------------+---------+-----------+----------+-------+  Left Findings: +----------+------------+---------+-----------+----------+--------------+ LEFT      CompressiblePhasicitySpontaneousProperties   Summary     +----------+------------+---------+-----------+----------+--------------+ IJV                                                 Not visualized +----------+------------+---------+-----------+----------+--------------+ Subclavian    Full       Yes       Yes                              +----------+------------+---------+-----------+----------+--------------+ Axillary      Full       Yes       Yes                             +----------+------------+---------+-----------+----------+--------------+ Brachial      Full       Yes       Yes                             +----------+------------+---------+-----------+----------+--------------+ Radial        Full                                                 +----------+------------+---------+-----------+----------+--------------+ Ulnar         Full                                                 +----------+------------+---------+-----------+----------+--------------+ Cephalic      Full                                                 +----------+------------+---------+-----------+----------+--------------+ Basilic       Full                                                 +----------+------------+---------+-----------+----------+--------------+ Unable to visualize the IJV due to line and bandages.  Summary:  Right: No evidence of thrombosis in the subclavian.  Left: No evidence of deep vein thrombosis in the upper extremity. No evidence of superficial vein thrombosis in the upper extremity.  *See table(s) above for measurements and observations.  Diagnosing physician: Monica Martinez MD Electronically signed by Monica Martinez MD on 06/30/2019 at 7:05:42 PM.    Final    Medications: . sodium chloride Stopped (07/02/19 0930)  . sodium chloride Stopped (06/30/19 1303)  . dextrose Stopped (07/02/19 0944)  . feeding supplement (NEPRO CARB STEADY) 1,000 mL (07/02/19 0947)  . phenylephrine (  NEO-SYNEPHRINE) Adult infusion 110 mcg/min (07/02/19 1000)   . amiodarone  200 mg Oral BID  . Chlorhexidine Gluconate Cloth  6 each Topical Q0600  . darbepoetin (ARANESP) injection - NON-DIALYSIS  60 mcg Subcutaneous Q Fri-1800  . famotidine  20 mg Oral Daily  . feeding supplement (PRO-STAT SUGAR FREE 64)  30 mL Per  Tube TID  . mouth rinse  15 mL Mouth Rinse BID  . methylPREDNISolone (SOLU-MEDROL) injection  60 mg Intravenous Q6H  . midodrine  10 mg Oral TID WC  . multivitamin  1 tablet Oral QHS    Dialysis Orders: [facility] 3h 58min 400/800 73kg 2/3.5Ca bath P4 AVF Hep 3300 No VDRA/ESA  OP Labs 06/10/19: Hgb 12.5 Ca 8.0 Phos 3.9 Alb 3.4 PTH 1743  Assessment/Plan: 1. Hypotension: Recurrent acute onset 2/25 -> back to ICU, on pressors. Repeat CT abd neg, foley placed w/o any purulent drainage. Remains on midodrine and stress dose steroids as well. 2. Citrobacter urosepsis/pyocystitis: Septic on admit, had distended bladder and thought to be stool, then seen by urologyanddx'd with pyocystis, s/p Foley drainage. On Cefazolin. 3. Thrombocytopenia:Significant with nadir 9 > 23 today. S/p 1U Platelets. No schistocytes on smear, haptoglobin neg, suspect sepsis related. 4. ESRD: MWF HD. HadCRRT2/15 - 06/26/19.Regular HD 2/26 -> next HD Monday 3/1. 5. AMS: resolved. CT head negative, has aneurysmal dilatation of bilateral cervical ICAs. 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 7. A-flutter: improved, per primary. On amiodarone. 8. Metabolic bone disease: Low Phos - binder held. Ca trending down - on sensipar. Raised dialysate to 3.5Ca bath.   Veneta Penton, PA-C 07/02/2019, 11:33 AM  [facility]

## 2019-07-03 DIAGNOSIS — I950 Idiopathic hypotension: Secondary | ICD-10-CM

## 2019-07-03 LAB — GLUCOSE, CAPILLARY
Glucose-Capillary: 122 mg/dL — ABNORMAL HIGH (ref 70–99)
Glucose-Capillary: 122 mg/dL — ABNORMAL HIGH (ref 70–99)
Glucose-Capillary: 131 mg/dL — ABNORMAL HIGH (ref 70–99)
Glucose-Capillary: 134 mg/dL — ABNORMAL HIGH (ref 70–99)
Glucose-Capillary: 137 mg/dL — ABNORMAL HIGH (ref 70–99)
Glucose-Capillary: 143 mg/dL — ABNORMAL HIGH (ref 70–99)

## 2019-07-03 LAB — RENAL FUNCTION PANEL
Albumin: 2.2 g/dL — ABNORMAL LOW (ref 3.5–5.0)
Albumin: 2.3 g/dL — ABNORMAL LOW (ref 3.5–5.0)
Anion gap: 11 (ref 5–15)
Anion gap: 14 (ref 5–15)
BUN: 34 mg/dL — ABNORMAL HIGH (ref 8–23)
BUN: 37 mg/dL — ABNORMAL HIGH (ref 8–23)
CO2: 19 mmol/L — ABNORMAL LOW (ref 22–32)
CO2: 23 mmol/L (ref 22–32)
Calcium: 6.9 mg/dL — ABNORMAL LOW (ref 8.9–10.3)
Calcium: 7.2 mg/dL — ABNORMAL LOW (ref 8.9–10.3)
Chloride: 104 mmol/L (ref 98–111)
Chloride: 105 mmol/L (ref 98–111)
Creatinine, Ser: 2.55 mg/dL — ABNORMAL HIGH (ref 0.61–1.24)
Creatinine, Ser: 3.03 mg/dL — ABNORMAL HIGH (ref 0.61–1.24)
GFR calc Af Amer: 23 mL/min — ABNORMAL LOW (ref >60)
GFR calc Af Amer: 28 mL/min — ABNORMAL LOW (ref >60)
GFR calc non Af Amer: 20 mL/min — ABNORMAL LOW (ref >60)
GFR calc non Af Amer: 24 mL/min — ABNORMAL LOW (ref >60)
Glucose, Bld: 124 mg/dL — ABNORMAL HIGH (ref 70–99)
Glucose, Bld: 149 mg/dL — ABNORMAL HIGH (ref 70–99)
Phosphorus: 1.8 mg/dL — ABNORMAL LOW (ref 2.5–4.6)
Phosphorus: 2.2 mg/dL — ABNORMAL LOW (ref 2.5–4.6)
Potassium: 3.1 mmol/L — ABNORMAL LOW (ref 3.5–5.1)
Potassium: 4 mmol/L (ref 3.5–5.1)
Sodium: 138 mmol/L (ref 135–145)
Sodium: 138 mmol/L (ref 135–145)

## 2019-07-03 LAB — COMPREHENSIVE METABOLIC PANEL
ALT: 5 U/L (ref 0–44)
AST: 30 U/L (ref 15–41)
Albumin: 2.3 g/dL — ABNORMAL LOW (ref 3.5–5.0)
Alkaline Phosphatase: 95 U/L (ref 38–126)
Anion gap: 14 (ref 5–15)
BUN: 37 mg/dL — ABNORMAL HIGH (ref 8–23)
CO2: 18 mmol/L — ABNORMAL LOW (ref 22–32)
Calcium: 7 mg/dL — ABNORMAL LOW (ref 8.9–10.3)
Chloride: 108 mmol/L (ref 98–111)
Creatinine, Ser: 3.28 mg/dL — ABNORMAL HIGH (ref 0.61–1.24)
GFR calc Af Amer: 21 mL/min — ABNORMAL LOW (ref >60)
GFR calc non Af Amer: 18 mL/min — ABNORMAL LOW (ref >60)
Glucose, Bld: 124 mg/dL — ABNORMAL HIGH (ref 70–99)
Potassium: 3.9 mmol/L (ref 3.5–5.1)
Sodium: 140 mmol/L (ref 135–145)
Total Bilirubin: 0.7 mg/dL (ref 0.3–1.2)
Total Protein: 4.2 g/dL — ABNORMAL LOW (ref 6.5–8.1)

## 2019-07-03 LAB — CBC WITH DIFFERENTIAL/PLATELET
Abs Immature Granulocytes: 0.04 10*3/uL (ref 0.00–0.07)
Basophils Absolute: 0 10*3/uL (ref 0.0–0.1)
Basophils Relative: 0 %
Eosinophils Absolute: 0 10*3/uL (ref 0.0–0.5)
Eosinophils Relative: 0 %
HCT: 23.7 % — ABNORMAL LOW (ref 39.0–52.0)
Hemoglobin: 8.4 g/dL — ABNORMAL LOW (ref 13.0–17.0)
Immature Granulocytes: 1 %
Lymphocytes Relative: 1 %
Lymphs Abs: 0.1 10*3/uL — ABNORMAL LOW (ref 0.7–4.0)
MCH: 30.1 pg (ref 26.0–34.0)
MCHC: 35.4 g/dL (ref 30.0–36.0)
MCV: 84.9 fL (ref 80.0–100.0)
Monocytes Absolute: 0.1 10*3/uL (ref 0.1–1.0)
Monocytes Relative: 2 %
Neutro Abs: 6.1 10*3/uL (ref 1.7–7.7)
Neutrophils Relative %: 96 %
Platelets: 6 10*3/uL — CL (ref 150–400)
RBC: 2.79 MIL/uL — ABNORMAL LOW (ref 4.22–5.81)
RDW: 18.6 % — ABNORMAL HIGH (ref 11.5–15.5)
WBC: 6.4 10*3/uL (ref 4.0–10.5)
nRBC: 0 % (ref 0.0–0.2)

## 2019-07-03 LAB — MAGNESIUM
Magnesium: 1.9 mg/dL (ref 1.7–2.4)
Magnesium: 2 mg/dL (ref 1.7–2.4)
Magnesium: 2 mg/dL (ref 1.7–2.4)

## 2019-07-03 LAB — PHOSPHORUS: Phosphorus: 2.4 mg/dL — ABNORMAL LOW (ref 2.5–4.6)

## 2019-07-03 LAB — C-REACTIVE PROTEIN: CRP: 6.2 mg/dL — ABNORMAL HIGH (ref <1.0)

## 2019-07-03 LAB — BRAIN NATRIURETIC PEPTIDE: B Natriuretic Peptide: 1724.1 pg/mL — ABNORMAL HIGH (ref 0.0–100.0)

## 2019-07-03 LAB — D-DIMER, QUANTITATIVE: D-Dimer, Quant: 10.19 ug/mL-FEU — ABNORMAL HIGH (ref 0.00–0.50)

## 2019-07-03 LAB — PROCALCITONIN: Procalcitonin: 5.84 ng/mL

## 2019-07-03 MED ORDER — ALTEPLASE 2 MG IJ SOLR: 2.0000 mg | Freq: Once | INTRAMUSCULAR | Status: DC | PRN

## 2019-07-03 MED ORDER — PRISMASOL BGK 4/2.5 32-4-2.5 MEQ/L REPLACEMENT SOLN
Status: DC
  Filled 2019-07-03 (×3): qty 5000

## 2019-07-03 MED ORDER — ACETAMINOPHEN 325 MG PO TABS: 650.0000 mg | ORAL_TABLET | Freq: Four times a day (QID) | ORAL | Status: DC | PRN

## 2019-07-03 MED ORDER — SODIUM CHLORIDE 0.9 % FOR CRRT
INTRAVENOUS_CENTRAL | Status: DC | PRN
  Filled 2019-07-03: qty 1000

## 2019-07-03 MED ORDER — PRISMASOL BGK 4/2.5 32-4-2.5 MEQ/L IV SOLN
INTRAVENOUS | Status: DC
  Filled 2019-07-03 (×22): qty 5000

## 2019-07-03 MED ORDER — PRISMASOL BGK 4/2.5 32-4-2.5 MEQ/L REPLACEMENT SOLN
Status: DC
  Filled 2019-07-03 (×5): qty 5000

## 2019-07-03 MED ORDER — HEPARIN SODIUM (PORCINE) 1000 UNIT/ML DIALYSIS
1000.0000 [IU] | INTRAMUSCULAR | Status: DC | PRN
  Administered 2019-07-05: 2800 [IU] via INTRAVENOUS_CENTRAL
  Filled 2019-07-03 (×2): qty 6
  Filled 2019-07-03: qty 3

## 2019-07-03 MED ORDER — OXYCODONE HCL 5 MG PO TABS
5.0000 mg | ORAL_TABLET | Freq: Four times a day (QID) | ORAL | Status: DC | PRN
  Administered 2019-07-03 – 2019-07-04 (×2): 5 mg via ORAL
  Filled 2019-07-03 (×2): qty 1

## 2019-07-03 MED ORDER — FAMOTIDINE 10 MG PO TABS
10.0000 mg | ORAL_TABLET | Freq: Every day | ORAL | Status: DC
  Administered 2019-07-04: 10 mg via ORAL
  Filled 2019-07-03: qty 1

## 2019-07-03 NOTE — Progress Notes (Signed)
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  He was transferred out of ICU and to [facility] on 2/23.  Called 2/25 for recurrent hypotension, not responsive to fluids/ albumin therefore was transferred back to ICU for vasopressor support.   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  His wife reports that he is normally alert and oriented, but seemed to be "spaced out" and was having a hard time answering questions.  He had no URI symptoms, fever, diarrhea, known ill contacts or obvious focal neuro deficits.  He missed dialysis on 2/13 and when he was not improving his wife called EMS.  Work-up in the ED revealed leukocytosis of 25k, thrombocytopenia, lactic acid of 3.2, bilirubin 2.4 with normal LFTs.  He received 3.5 L IV fluids and empiric cefepime, vancomycin, Flagyl after which his blood pressure remained borderline and mental status only slightly improved.   Patient will answer questions and mentions left-sided back pain, denies abdominal pain. CT head was ordered and PCCM consulted for admission.  Covid-19 negative.  Past Medical History   has a past medical history of Anemia in chronic kidney disease, Arthritis, CHF (congestive heart failure)  ([place]), Chronic back pain, End stage renal disease ([place]), GERD (gastroesophageal reflux disease), Gout, Heart failure with reduced ejection fraction ([place]), and Hypertension. celiac and superior mesenteric artery aneurysms  Significant Hospital Events   2/14 Admit to [facility] 2/14 He developed SVT, was hemodynamically unstable, treated initially with adenosine but ultimately required DCCV around 715. Has remained hypotensive, received albumin and now another 2 L IV fluid resuscitation (5 L total). Interacting but confused, still not back to baseline per wife at bedside 2/15 Stool-like produced from in-out urinary cath 2/16 Urology consult, confirmed pyocystitis and drained bladder 2/22 out of ICU to [facility] on 2/23 2/25 PCCM re-consult given hypotension 2/26 Remains on vasopressors support  2/28 CRRT initiated  Consults:  Nephrology 2/14 Urology 2/16 Hematology 2/22 Palliative care 2/26   Procedures:  2/14 left IJ HD cath insertion >> 2/16 Bedside bladder aspiration  2/25 foley >> 2/26 (no output or pus noted)  Significant Diagnostic Tests:  2/14 CXR>>Cardiomegaly.  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 Chronic elevation of the left hemidiaphragm with chronic volume loss at the left lung base.  2/14 CT Abd 1. No acute pulmonary embolism. 2. Small bilateral pleural effusions with adjacent atelectasis. 3. Dilated main pulmonary artery, which may be secondary to pulmonary arterial hypertension. 4. Interval development of wedge-shaped defects involving the spleen, which may represent splenic infarcts. 5. Cholelithiasis with mild gallbladder wall thickening and pericholecystic free fluid, which may be secondary to underlying liver disease. If there is clinical concern for acute cholecystitis, follow-up with ultrasound is recommended. 6. 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 7. Status post endovascular repair of bilateral common iliac  artery aneurysms with stable appearance of the hardware. 8. Additional chronic findings as detailed above. 9. Aortic Atherosclerosis (ICD10-I70.0).  2/16 CT Pelvis with Rectal contrast 1. Negative for colovesicular fistula. Rectal contrast only opacifies the colon. 2. Bladder wall thickening likely from chronic outlet obstruction. The prostate is enlarged. 3. Prominent anasarca.  CT Head 2/14 No acute intracranial finding. Atrophy. Chronic small-vessel ischemic changes. 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  Echo 2/14 . Left ventricular ejection fraction, by estimation, is 60 to 65%. The  left ventricle has normal function. The left ventricle has no regional  wall motion abnormalities. Left ventricular diastolic parameters are  indeterminate.   2. Right ventricular systolic function is normal. The right ventricular  size is normal.   3. Left atrial size was severely dilated.   4. Right atrial size was mildly dilated.   5. The mitral valve is degenerative. Mild mitral valve regurgitation. No  evidence of mitral stenosis.   6. The aortic valve is tricuspid. Aortic valve regurgitation is mild to  moderate. Mild aortic valve stenosis.   7. The inferior vena cava is normal in size with greater than 50%  respiratory variability, suggesting right atrial pressure of 3 mmHg.   RUQ U/S 2/15 1. Both the gallbladder and the CBD appear mildly distended with sludge but no stone identified. No intrahepatic biliary ductal dilatation to strongly suggest acute bile duct obstruction. Some gallbladder wall thickening, although no sonographic Murphy sign elicited to strongly suggest acalculous cholecystitis. 2. If abdominal pain continues then a nuclear medicine hepatobiliary scan would be valuable to exclude acute CBD or cystic duct Obstruction.  2/25 BLE DVT US >>  neg 2/25 LUE DVT US >> neg  2/25 CT abd/ pelvis >> 1. Small bilateral pleural effusions. 2. Cardiomegaly with diffuse atherosclerosis of the coronary vessels. 3. Cholelithiasis without cholecystitis. 4. Bilateral renal atrophy consistent with end-stage renal disease. Stable indeterminate 1.2 cm lesion left kidney. 5. High attenuation material within the bladder lumen may be related to excretion of previously administered contrast. Please correlate with urinalysis. 6. Trace simple appearing pelvic free fluid. 7. Stable endoluminal stent graft within the aorta and bilateral common iliac arteries. Evaluation of the vascular lumen is limited without contrast. 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  Micro Data:  2/14 respiratory viral panel>> negative 2/14 blood cultures x2>> Citrobacter, sensitivities below 2/15 UCx Citrobacter pansensitive 2/25 UC >> uncollected 2/25 BCx2 >>  Antimicrobials:  Flagyl 2/14 only Cefepime 2/14 > 2/15 Vancomycin 2/14 > 2/15 Meropenem 2/15 > 2/16 Ceftriaxone 2/16 > 2/22 Cefazolin 2/23 > 2/27  Interim history/subjective:  RN reports no acute events overnight, patient seen sitting up in bed attempting to eat breakfast.  He is reporting 8/10 back pain this morning.  Currently 21 L positive  Objective   Blood pressure (!) 112/54, pulse 69, temperature 97.7 F (36.5 C), temperature source Oral, resp. rate 14, height 6\' 4"  (1.93 m), weight 83.7 kg, SpO2 100 %.        Intake/Output Summary (Last 24 hours) at 07/03/2019 1024 Last data filed at 07/03/2019 1000 Gross per 24 hour  Intake 2971.97 ml  Output --  Net 2971.97 ml   Filed Weights   07/01/19 0700 07/02/19 0500 07/03/19 0500  Weight: 79.9 kg 81.9 kg 83.7 kg   General: Chronically ill appearing elderly male sitting up in bed in no  acute distress, in NAD HEENT: /AT, MM pink/moist, PERRL, core track tube in place Neuro: Alert and oriented, nonfocal CV: s1s2 regular rate and  rhythm, no murmur, rubs, or gallops,  PULM: Clear to auscultation bilaterally, no increased work of breathing, no added breath sounds, oxygen saturations 96 to 100% on room air GI: soft, bowel sounds active in all 4 quadrants, non-tender, non-distended, tolerating TF Extremities: warm/dry, generalized volume overload edema  Skin: no rashes or lesions   Resolved Hospital Problem list   Septic Shock 2/2 to Citrobacter bacteremia  Acute Metabolic encephalopathy  Assessment & Plan:   Hypotension -- undifferentiated, possible multifactorial sepsis vs dehydration vs missing midodrine doses  - EKG non acute, trop trend flat  - CT abd/ pelvis as above without obvious source of infection - H/H trend stable  - down trending PCT  P:  Initiate CRRT as below Continue Phenylephrine for MAP goal greater than 55 Continue increase midodrine Oral hydrocortisone initiated 2/27, IV Solu-Medrol discontinued  ESRD P: Nephrology following, appreciate assistance We will initiate CRRT per nephrology 2/28 Continue pressor support to allow for volume removal  Severe thrombocytopenia - likely contributed to sepsis, overall not concerning for TTP/ HUS  P:  [name] to limited IV access of had difficulty obtaining lab work Most recent platelet count 23 on 2/26 Will be labs once CRRT initiated Trend CBC as able Hemoglobin stable Hematology has evaluated patient during admission Transfusion of platelets less than 10  Afib/RVR - new onset, CHADSVASC2 score 3, not candidate for [facility] given severe thrombocytopenia  P:  Continue as needed amiodarone Right remains controlled Continue telemetry Monitor and replete electrolytes as needed  Protein calorie malnutrition, dysphagia P:  Cleared for dysphagia 1 diet however [name] to poor intake patient remains on tube feeds Encourage oral intake as able Core track remains in place  Celiac and superior mesenteric artery aneurysms -On CT scanning 05/2019 aneurysmal  dilatation of bilateral common iliac arteries again noted. Left measures 3.5 cm in diameter, R 3.3 cm.  P:  Follow-up with vascular as an outpatient  Possible renal cell cancer P:  Follow-up in the outpatient setting  Best practice:  Diet: Dysphagia 1 diet, supplemental tube feeds as well Pain/Anxiety/Delirium protocol (if indicated): N/A VAP protocol (if indicated): N/A DVT prophylaxis: SCDs GI prophylaxis: PPI Glucose control: CBG q 4 till stable Mobility: Bedrest Code Status:PMT consulted 2/26, appreciate, patient wishes to remain full code  Family Communication: Attempt again today 2/28 to update Mickel Baas, wife,  Disposition: tx to ICU   CRITICAL CARE Performed by: Johnsie Cancel   Total critical care time: 38 minutes  Critical care time was exclusive of separately billable procedures and treating other patients.  Critical care was necessary to treat or prevent imminent or life-threatening deterioration.  Critical care was time spent personally by me on the following activities: development of treatment plan with patient and/or surrogate as well as nursing, discussions with consultants, evaluation of patient's response to treatment, examination of patient, obtaining history from patient or surrogate, ordering and performing treatments and interventions, ordering and review of laboratory studies, ordering and review of radiographic studies, pulse oximetry and re-evaluation of patient's condition.  Johnsie Cancel, NP-C [facility] Contact / Pager information can be found on Amion  07/03/2019, 10:24 AM

## 2019-07-03 NOTE — Progress Notes (Addendum)
[facility] Progress Note   Subjective:  Seen in room - remains on pressors, fluid building up in thighs. D/w RN and NP - will restart CRRT - Dr. Jonnie Finner will place orders shortly.  Denies CP/abd pain - only major complaint is discomfort from NG tube.  Objective Vitals:   07/03/19 0645 07/03/19 0700 07/03/19 0800 07/03/19 0900  BP: (!) 109/50 (!) 112/56 121/63 (!) 126/59  Pulse: 66  89 84  Resp: 12 13 17 19   Temp:   97.7 F (36.5 C)   TempSrc:   Oral   SpO2: 98% 96% (!) 86% 100%  Weight:      Height:       Physical Exam General: Chronically ill appearing man, NAD Heart: RRR; 2/6 murmur Lungs: CTA anteriorly Abdomen: soft, non-tender Extremities: 1+ pitting BLE edema in thighs, 2+ LUE edema Dialysis Access: R AVG + thrill + central line in L neck  Additional Objective Labs: Basic Metabolic Panel: Recent Labs  Lab 07/01/19 0646 07/01/19 0745 07/03/19 0723  NA 137 134* 140  K 4.1 3.6 3.9  CL 101 100 108  CO2 20* 21* 18*  GLUCOSE 89 101* 124*  BUN 43* 43* 37*  CREATININE 3.86* 3.88* 3.28*  CALCIUM 6.8* 6.8* 7.0*  PHOS 3.6 3.6 2.4*   Liver Function Tests: Recent Labs  Lab 06/30/19 0432 06/30/19 0432 07/01/19 0646 07/01/19 0745 07/03/19 0723  AST 20  --  25  --  30  ALT 16  --  7  --  5  ALKPHOS 70  --  80  --  95  BILITOT 0.9  --  0.9  --  0.7  PROT 4.3*  --  4.9*  --  4.2*  ALBUMIN 2.4*   < > 2.7* 2.5* 2.3*   < > = values in this interval not displayed.   CBC: Recent Labs  Lab 06/27/19 0259 06/27/19 1623 06/28/19 1945 06/28/19 1945 06/29/19 0428 06/29/19 0428 06/30/19 0432 06/30/19 1336 07/01/19 0646  WBC 19.8*  --  24.6*   < > 21.6*  --  16.8*  --  19.5*  NEUTROABS  --   --  23.0*   < > 19.9*  --  15.3*  --  17.5*  HGB 9.4*  --  9.0*   < > 8.8*   < > 8.8* 10.3* 10.4*  HCT 28.4*   < > 27.1*   < > 25.9*   < > 25.7* 30.1* 29.9*  MCV 89.9  --  88.9  --  88.7  --  85.7  --  84.2  PLT 11*   < > 27*   < > 23*  --  21*  --  23*   < >  = values in this interval not displayed.   Blood Culture    Component Value Date/Time   SDES BLOOD LEFT HAND 06/30/2019 1400   SDES BLOOD LEFT HAND 06/30/2019 1400   SPECREQUEST  06/30/2019 1400    BOTTLES DRAWN AEROBIC ONLY Blood Culture adequate volume   SPECREQUEST  06/30/2019 1400    BOTTLES DRAWN AEROBIC ONLY Blood Culture results may not be optimal due to an inadequate volume of blood received in culture bottles   CULT  06/30/2019 1400    NO GROWTH 3 DAYS Performed at [facility], [place] [address], [place], [place] [address]    CULT  06/30/2019 1400    NO GROWTH 3 DAYS Performed at [facility], [place] [address] [place], [place]  [address]    REPTSTATUS PENDING 06/30/2019 1400   REPTSTATUS PENDING 06/30/2019 1400   Studies/Results: DG Abd 1 View  Result Date: 07/01/2019 CLINICAL DATA:  Enteric catheter placement EXAM: ABDOMEN - 1 VIEW COMPARISON:  11/08/2014, 06/30/2019 FINDINGS: Frontal view of the lower chest and upper abdomen was performed. Enteric catheter tip projects over gastric body. Bowel gas pattern is unremarkable and stable. Endoluminal aortic stent graft again identified. Persistent left basilar consolidation and effusion. Diffuse calcification of the mitral annulus. IMPRESSION: 1. Enteric catheter overlying gastric body. Electronically Signed   By: Randa Ngo M.D.   On: 07/01/2019 19:04   Medications: . sodium chloride Stopped (07/02/19 1258)  . sodium chloride Stopped (06/30/19 1303)  . feeding supplement (NEPRO CARB STEADY) 40 mL/hr at 07/03/19 0234  . phenylephrine (NEO-SYNEPHRINE) Adult infusion 120 mcg/min (07/03/19 0900)   . amiodarone  200 mg Oral BID  . Chlorhexidine Gluconate Cloth  6 each Topical Q0600  . darbepoetin (ARANESP) injection - NON-DIALYSIS  60 mcg Subcutaneous Q Fri-1800  . famotidine  20 mg Oral Daily  . feeding supplement (PRO-STAT SUGAR FREE 64)  30 mL Per Tube TID  . hydrocortisone  10 mg Oral q AM  . hydrocortisone  20  mg Oral QPM  . mouth rinse  15 mL Mouth Rinse BID  . midodrine  20 mg Oral TID WC  . multivitamin  1 tablet Oral QHS    Dialysis Orders: [facility] M[facility] 3h 72min 400/800 73kg 2/3.5Ca bath P4 AVF Hep 3300 No VDRA/ESA  OP Labs 06/10/19: Hgb 12.5 Ca 8.0 Phos 3.9 Alb 3.4 PTH 1743  Assessment/Plan: 1. Hypotension: Recurrent acute onset 2/25 -> back to ICU, remains on pressors. Repeat CT abd neg, foley placed w/o any purulent drainage, foley is out now. Remains on midodrine and stress dose steroids as well. 2. Citrobacterurosepsis/pyocystitis: Septic on admit,had distended bladder and thought to be stool, then seen by urologyanddx'd with pyocystis, s/p Foley drainage then removed. On Cefazolin. 3. Thrombocytopenia:Significant with nadir 9> 23 today. S/p1U Platelets.No schistocytes on smear, haptoglobin neg,suspect sepsis related. 4. ESRD: MWFHD.HadCRRT2/15 - 06/26/19.Regular HD 2/26 -> see above - BP remains low and developing edema, will transition back to CRRT. Orders to be written shortly. 5. EXH:BZJIRCVE.CT head negative, has aneurysmal dilatation of bilateral cervical ICAs. 6. Anemiaof ESRD: Hgb up to 10.4 - getting Aranesp 33mcg IV q Fri. 7. A-flutter: improved, per primary. On amiodarone. 8. Metabolic bone disease: Low Phos - binder held. Ca trending down - on sensipar.    Veneta Penton, PA-C 07/03/2019, 9:52 AM  [facility]  Pt seen, examined and agree w assess/plan as above with additions as indicated. Resuming CRRT given hypotension and vol overload. Get vol down as tolerated w/ pressor support.  [facility] 07/03/2019, 1:26 PM

## 2019-07-04 LAB — RENAL FUNCTION PANEL
Albumin: 2.3 g/dL — ABNORMAL LOW (ref 3.5–5.0)
Albumin: 2.3 g/dL — ABNORMAL LOW (ref 3.5–5.0)
Anion gap: 9 (ref 5–15)
Anion gap: 8 (ref 5–15)
BUN: 29 mg/dL — ABNORMAL HIGH (ref 8–23)
BUN: 28 mg/dL — ABNORMAL HIGH (ref 8–23)
CO2: 23 mmol/L (ref 22–32)
CO2: 25 mmol/L (ref 22–32)
Calcium: 7.3 mg/dL — ABNORMAL LOW (ref 8.9–10.3)
Calcium: 7 mg/dL — ABNORMAL LOW (ref 8.9–10.3)
Chloride: 107 mmol/L (ref 98–111)
Chloride: 106 mmol/L (ref 98–111)
Creatinine, Ser: 2.2 mg/dL — ABNORMAL HIGH (ref 0.61–1.24)
Creatinine, Ser: 1.76 mg/dL — ABNORMAL HIGH (ref 0.61–1.24)
GFR calc Af Amer: 34 mL/min — ABNORMAL LOW (ref >60)
GFR calc Af Amer: 44 mL/min — ABNORMAL LOW (ref >60)
GFR calc non Af Amer: 29 mL/min — ABNORMAL LOW (ref >60)
GFR calc non Af Amer: 38 mL/min — ABNORMAL LOW (ref >60)
Glucose, Bld: 97 mg/dL (ref 70–99)
Glucose, Bld: 126 mg/dL — ABNORMAL HIGH (ref 70–99)
Phosphorus: 1.5 mg/dL — ABNORMAL LOW (ref 2.5–4.6)
Phosphorus: 1.5 mg/dL — ABNORMAL LOW (ref 2.5–4.6)
Potassium: 3.1 mmol/L — ABNORMAL LOW (ref 3.5–5.1)
Potassium: 3.2 mmol/L — ABNORMAL LOW (ref 3.5–5.1)
Sodium: 139 mmol/L (ref 135–145)
Sodium: 139 mmol/L (ref 135–145)

## 2019-07-04 LAB — MAGNESIUM: Magnesium: 2 mg/dL (ref 1.7–2.4)

## 2019-07-04 LAB — GLUCOSE, CAPILLARY
Glucose-Capillary: 126 mg/dL — ABNORMAL HIGH (ref 70–99)
Glucose-Capillary: 124 mg/dL — ABNORMAL HIGH (ref 70–99)
Glucose-Capillary: 123 mg/dL — ABNORMAL HIGH (ref 70–99)
Glucose-Capillary: 113 mg/dL — ABNORMAL HIGH (ref 70–99)
Glucose-Capillary: 113 mg/dL — ABNORMAL HIGH (ref 70–99)
Glucose-Capillary: 90 mg/dL (ref 70–99)

## 2019-07-04 LAB — APTT: aPTT: 32 seconds (ref 24–36)

## 2019-07-04 MED ORDER — SODIUM CHLORIDE 0.9% IV SOLUTION: Freq: Once | INTRAVENOUS | Status: AC

## 2019-07-04 MED ORDER — PHENYLEPHRINE CONCENTRATED 100MG/250ML (0.4 MG/ML) INFUSION SIMPLE
0.0000 ug/min | INTRAVENOUS | Status: DC
  Administered 2019-07-04: 120 ug/min via INTRAVENOUS
  Administered 2019-07-04: 190 ug/min via INTRAVENOUS
  Administered 2019-07-05: 50 ug/min via INTRAVENOUS
  Filled 2019-07-04 (×4): qty 250

## 2019-07-04 MED ORDER — POTASSIUM PHOSPHATES 15 MMOLE/5ML IV SOLN
30.0000 mmol | Freq: Once | INTRAVENOUS | Status: DC
  Administered 2019-07-04: 10:00:00 30 mmol via INTRAVENOUS
  Filled 2019-07-04: qty 10

## 2019-07-04 MED ORDER — SODIUM CHLORIDE 0.9% FLUSH: 10.0000 mL | INTRAVENOUS | Status: DC | PRN

## 2019-07-04 MED ORDER — VITAL 1.5 CAL PO LIQD
1000.0000 mL | ORAL | Status: AC
  Administered 2019-07-04 – 2019-07-06 (×3): 1000 mL
  Filled 2019-07-04 (×5): qty 1000

## 2019-07-04 MED ORDER — PRO-STAT SUGAR FREE PO LIQD
60.0000 mL | Freq: Three times a day (TID) | ORAL | Status: DC
  Administered 2019-07-04 – 2019-07-15 (×33): 60 mL
  Filled 2019-07-04 (×33): qty 60

## 2019-07-04 MED ORDER — SODIUM CHLORIDE 0.9% FLUSH
10.0000 mL | Freq: Two times a day (BID) | INTRAVENOUS | Status: DC
  Administered 2019-07-04 – 2019-07-05 (×3): 10 mL
  Administered 2019-07-05: 20 mL
  Administered 2019-07-06 – 2019-07-11 (×7): 10 mL
  Administered 2019-07-15: 30 mL

## 2019-07-04 MED ORDER — K PHOS MONO-SOD PHOS DI & MONO 155-852-130 MG PO TABS
500.0000 mg | ORAL_TABLET | ORAL | Status: AC
  Administered 2019-07-04 (×2): 500 mg
  Filled 2019-07-04 (×2): qty 2

## 2019-07-04 MED ORDER — FAMOTIDINE 20 MG PO TABS
20.0000 mg | ORAL_TABLET | Freq: Every day | ORAL | Status: DC
  Administered 2019-07-05 – 2019-07-06 (×2): 20 mg via ORAL
  Filled 2019-07-04 (×2): qty 1

## 2019-07-04 NOTE — Progress Notes (Signed)
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  He was transferred out of ICU and to [facility] on 2/23.  Called 2/25 for recurrent hypotension, not responsive to fluids/ albumin therefore was transferred back to ICU for vasopressor support.   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  His wife reports that he is normally alert and oriented, but seemed to be "spaced out" and was having a hard time answering questions.  He had no URI symptoms, fever, diarrhea, known ill contacts or obvious focal neuro deficits.  He missed dialysis on 2/13 and when he was not improving his wife called EMS.  Work-up in the ED revealed leukocytosis of 25k, thrombocytopenia, lactic acid of 3.2, bilirubin 2.4 with normal LFTs.  He received 3.5 L IV fluids and empiric cefepime, vancomycin, Flagyl after which his blood pressure remained borderline and mental status only slightly improved.   Patient will answer questions and mentions left-sided back pain, denies abdominal pain. CT head was ordered and PCCM consulted for admission.  Covid-19 negative.  Past Medical History   has a past medical history of Anemia in chronic kidney disease, Arthritis, CHF (congestive heart failure)  ([place]), Chronic back pain, End stage renal disease ([place]), GERD (gastroesophageal reflux disease), Gout, Heart failure with reduced ejection fraction ([place]), and Hypertension. celiac and superior mesenteric artery aneurysms  Significant Hospital Events   2/14 Admit to [facility] 2/14 He developed SVT, was hemodynamically unstable, treated initially with adenosine but ultimately required DCCV around 715. Has remained hypotensive, received albumin and now another 2 L IV fluid resuscitation (5 L total). Interacting but confused, still not back to baseline per wife at bedside 2/15 Stool-like produced from in-out urinary cath 2/16 Urology consult, confirmed pyocystitis and drained bladder 2/22 out of ICU to [facility] on 2/23 2/25 PCCM re-consult given hypotension 2/26 Remains on vasopressors support  2/28 CRRT initiated  Consults:  Nephrology 2/14 Urology 2/16 Hematology 2/22 Palliative care 2/26   Procedures:  2/14 left IJ HD cath insertion >> 2/16 Bedside bladder aspiration  2/25 foley >> 2/26 (no output or pus noted)  Significant Diagnostic Tests:  2/14 CXR>>Cardiomegaly.  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 Chronic elevation of the left hemidiaphragm with chronic volume loss at the left lung base.  2/14 CT Abd 1. No acute pulmonary embolism. 2. Small bilateral pleural effusions with adjacent atelectasis. 3. Dilated main pulmonary artery, which may be secondary to pulmonary arterial hypertension. 4. Interval development of wedge-shaped defects involving the spleen, which may represent splenic infarcts. 5. Cholelithiasis with mild gallbladder wall thickening and pericholecystic free fluid, which may be secondary to underlying liver disease. If there is clinical concern for acute cholecystitis, follow-up with ultrasound is recommended. 6. 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 7. Status post endovascular repair of bilateral common iliac  artery aneurysms with stable appearance of the hardware. 8. Additional chronic findings as detailed above. 9. Aortic Atherosclerosis (ICD10-I70.0).  2/16 CT Pelvis with Rectal contrast 1. Negative for colovesicular fistula. Rectal contrast only opacifies the colon. 2. Bladder wall thickening likely from chronic outlet obstruction. The prostate is enlarged. 3. Prominent anasarca.  CT Head 2/14 No acute intracranial finding. Atrophy. Chronic small-vessel ischemic changes. 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  Echo 2/14 . Left ventricular ejection fraction, by estimation, is 60 to 65%. The  left ventricle has normal function. The left ventricle has no regional  wall motion abnormalities. Left ventricular diastolic parameters are  indeterminate.   2. Right ventricular systolic function is normal. The right ventricular  size is normal.   3. Left atrial size was severely dilated.   4. Right atrial size was mildly dilated.   5. The mitral valve is degenerative. Mild mitral valve regurgitation. No  evidence of mitral stenosis.   6. The aortic valve is tricuspid. Aortic valve regurgitation is mild to  moderate. Mild aortic valve stenosis.   7. The inferior vena cava is normal in size with greater than 50%  respiratory variability, suggesting right atrial pressure of 3 mmHg.   RUQ U/S 2/15 1. Both the gallbladder and the CBD appear mildly distended with sludge but no stone identified. No intrahepatic biliary ductal dilatation to strongly suggest acute bile duct obstruction. Some gallbladder wall thickening, although no sonographic Murphy sign elicited to strongly suggest acalculous cholecystitis. 2. If abdominal pain continues then a nuclear medicine hepatobiliary scan would be valuable to exclude acute CBD or cystic duct Obstruction.  2/25 BLE DVT US >>  neg 2/25 LUE DVT US >> neg  2/25 CT abd/ pelvis >> 1. Small bilateral pleural effusions. 2. Cardiomegaly with diffuse atherosclerosis of the coronary vessels. 3. Cholelithiasis without cholecystitis. 4. Bilateral renal atrophy consistent with end-stage renal disease. Stable indeterminate 1.2 cm lesion left kidney. 5. High attenuation material within the bladder lumen may be related to excretion of previously administered contrast. Please correlate with urinalysis. 6. Trace simple appearing pelvic free fluid. 7. Stable endoluminal stent graft within the aorta and bilateral common iliac arteries. Evaluation of the vascular lumen is limited without contrast. 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  Micro Data:  2/14 respiratory viral panel>> negative 2/14 blood cultures x2>> Citrobacter, sensitivities below 2/15 UCx Citrobacter pansensitive 2/25 UC >> uncollected 2/25 BCx2 >>  Antimicrobials:  Flagyl 2/14 only Cefepime 2/14 > 2/15 Vancomycin 2/14 > 2/15 Meropenem 2/15 > 2/16 Ceftriaxone 2/16 > 2/22 Cefazolin 2/23 > 2/27  Interim history/subjective:  Remains on CRRT Neo at 150 mcg/min  Patient complains of discomfort at PIV site with kphos running.  Still very poor appetite and intake  On bair hugger for hypothermia Platelet 6, no signs of bleeding, H/H stable   Objective   Blood pressure (!) 128/54, pulse (!) 115, temperature (!) 97.3 F (36.3 C), temperature source Oral, resp. rate (!) 21, height 6\' 4"  (1.93 m), weight 86.2 kg, SpO2 (!) 63 %.        Intake/Output Summary (Last 24 hours) at 07/04/2019 1108 Last data filed at 07/04/2019 1000 Gross per 24 hour  Intake 2246.28 ml  Output 3040 ml  Net -793.72 ml   Filed Weights   07/02/19 0500 07/03/19 0500 07/04/19 0500  Weight: 81.9 kg 83.7 kg 86.2  kg   General:  Chronically ill, frail elderly male sitting in bed in CRRT in NAD, wife at bedside HEENT: MM pink/moist Neuro: Awake, appropriate, pleasant  today, MAE weakly  CV: rr, no murmur PULM:  On room air, non labored, diminished in bases GI: soft, NT, ND, +bs, flexiseal in  Extremities: warm/dry, generalized edema  Skin: no rashes   Resolved Hospital Problem list   Septic Shock 2/2 to Citrobacter bacteremia  Acute Metabolic encephalopathy  Assessment & Plan:   Hypotension -- undifferentiated, possible multifactorial sepsis vs dehydration vs missing midodrine doses vs adrenal insufficiency  P:  Following cultures, negative thus far, monitoring clinically off abx  Continuing CRRT  Neo for MAP goal > 55 Continue midodrine TID Continue Phenylephrine for MAP goal greater than 55 Continue oral ral hydrocortisone initiated 2/27, IV Solu-Medrol discontinued  ESRD P: Nephrology following, appreciate assistance Continue CRRT Continue pressor support to allow for volume removal  Severe thrombocytopenia - likely contributed to sepsis, overall not concerning for TTP/ HUS  P:  plt 6 today, will transfuse 1 pack  Trend CBC/ H?H stable    Due to limited IV access of had difficulty obtaining lab work Most recent platelet count 23 on 2/26 Will be labs once CRRT initiated Trend CBC as able Hemoglobin stable Hematology has evaluated patient during admission Transfusion of platelets less than 10  Afib/RVR - new onset, CHADSVASC2 score 3, not candidate for [facility] given severe thrombocytopenia  P:  Tele monitoring, rate control Continue PO amiodarone K goal > 4, Mag > 2  Protein calorie malnutrition, dysphagia Immobility  P:  Cleared for dysphagia 1 diet however due to poor intake patient remains on tube feeds Encourage oral intake as able Core track remains in place Encourage PT/OT when able  Celiac and superior mesenteric artery aneurysms -On CT scanning 05/2019 aneurysmal dilatation of bilateral common iliac arteries again noted. Left measures 3.5 cm in diameter, R 3.3 cm.  P:  Follow-up with vascular as an  outpatient  Possible renal cell cancer P:  Follow-up in the outpatient setting  Best practice:  Diet: Dysphagia 1 diet, supplemental tube feeds as well Pain/Anxiety/Delirium protocol (if indicated): N/A VAP protocol (if indicated): N/A DVT prophylaxis: SCDs GI prophylaxis: PPI Glucose control: CBG q 4  Mobility: Bedrest Code Status: PMT consulted 2/26, appreciate, patient wishes to remain full code, patient was independent in ADLs prior to this admission Family Communication: Wife, Mickel Baas, at bedside updated,  Disposition: ICU  CRITICAL CARE Performed by: Kennieth Rad   Total critical care time: 35 minutes   Critical care time was exclusive of separately billable procedures and treating other patients.   Critical care was necessary to treat or prevent imminent or life-threatening deterioration.   Critical care was time spent personally by me on the following activities: development of treatment plan with patient and/or surrogate as well as nursing, discussions with consultants, evaluation of patient's response to treatment, examination of patient, obtaining history from patient or surrogate, ordering and performing treatments and interventions, ordering and review of laboratory studies, ordering and review of radiographic studies, pulse oximetry and re-evaluation of patient's condition.  Kennieth Rad, MSN, AGACNP-BC [facility] 07/04/2019, 11:10 AM

## 2019-07-04 NOTE — Progress Notes (Signed)
Lost peripheral IV access. Platelets ordered. Phenylephrine running through HD pigtail. Orders to give platelets through pigtail per Dr. Elsworth Soho. Considering central line placement, but platelets too low at this time.

## 2019-07-04 NOTE — Progress Notes (Signed)
Physical Therapy Treatment Patient Details Name: Gregory [name] MRN: [id number] DOB: 11-05-1950 Today's Date: 07/04/2019    History of Present Illness 69yo male with 2 days of lethargy and confusion/AMS. CTH negative, negative for PE, covid negative at admission. Admitted for sepsis workup, encephalopathy. PMH ESRD on HD, anemia, CHF, HTN, celiac and mesenteric artery aneurysms, iliac artery aneurysm s/p graft repair    PT Comments    Limited to bed exercise for UE and LE's due to CRRT in conjunction with very low platelet count.    Follow Up Recommendations  CIR;Supervision/Assistance - 24 hour     Equipment Recommendations  Other (comment)(TBA)    Recommendations for Other Services       Precautions / Restrictions Precautions Precautions: Fall    Mobility  Bed Mobility Overal bed mobility: Needs Assistance Bed Mobility: Rolling Rolling: Min assist         General bed mobility comments: rolled for peri care and repositioning.  pt unable to get up today due to CRRT and very low platelet count  Transfers                 General transfer comment: unable today  Ambulation/Gait                 Stairs             Wheelchair Mobility    Modified Rankin (Stroke Patients Only)       Balance                                            Cognition Arousal/Alertness: Awake/alert Behavior During Therapy: WFL for tasks assessed/performed;Flat affect Overall Cognitive Status: (NT, but seems to be thinking relatively clearly)                                        Exercises General Exercises - Upper Extremity Shoulder Flexion: AROM;Both;10 reps;Supine Elbow Flexion: AROM;Both;10 reps;Supine(with graded resistance) Elbow Extension: AROM;Both;10 reps;Supine(with graded resistance) General Exercises - Lower Extremity Ankle Circles/Pumps: AROM;Both;10 reps;Supine Quad Sets: AROM;Both;10 reps Heel Slides:  AROM;Both;10 reps;Supine(with graded resistance in gross extention) Hip ABduction/ADduction: AROM;Both;10 reps;Supine;AAROM Straight Leg Raises: AAROM;Both;10 reps;Supine    General Comments        Pertinent Vitals/Pain Pain Assessment: Faces Faces Pain Scale: No hurt Pain Intervention(s): Monitored during session    Home Living                      Prior Function            PT Goals (current goals can now be found in the care plan section) Acute Rehab PT Goals Patient Stated Goal: go home PT Goal Formulation: With patient/family Time For Goal Achievement: 07/12/19 Potential to Achieve Goals: Fair Progress towards PT goals: Not progressing toward goals - comment(CRRT and status limiting mobility)    Frequency    Min 3X/week      PT Plan Current plan remains appropriate    Co-evaluation              AM-PAC PT "6 Clicks" Mobility   Outcome Measure  Help needed turning from your back to your side while in a flat bed without using bedrails?: A Little Help needed moving from lying on your back  to sitting on the side of a flat bed without using bedrails?: A Lot Help needed moving to and from a bed to a chair (including a wheelchair)?: A Lot Help needed standing up from a chair using your arms (e.g., wheelchair or bedside chair)?: Total Help needed to walk in hospital room?: Total Help needed climbing 3-5 steps with a railing? : Total 6 Click Score: 10    End of Session   Activity Tolerance: Patient tolerated treatment well;Patient limited by fatigue Patient left: with call bell/phone within reach;in bed;with bed alarm set;with family/visitor present Nurse Communication: Mobility status PT Visit Diagnosis: Muscle weakness (generalized) (M62.81);Other abnormalities of gait and mobility (R26.89);Difficulty in walking, not elsewhere classified (R26.2)     Time: 5498-2641 PT Time Calculation (min) (ACUTE ONLY): 27 min  Charges:  $Therapeutic Exercise:  23-37 mins                     07/04/2019  [name], PT Acute Rehabilitation Services [phone]  (pager) [phone]  (office)   Tessie Fass Kaeley Vinje 07/04/2019, 4:45 PM

## 2019-07-04 NOTE — Progress Notes (Signed)
Order received for PIV restart. Patient is on dialysis, has a RUE fistula/graft. Patient had a PIV in LUE. +2-3 edema, painful to flush, and no blood return. Fearful of infiltration, the PIV was removed. Do to patient's HD status, he is not a candidate for a Midline or PICC unless approved by Nephrology. Situation discussed with Lauren, RN, who will contact primary to address IV status.

## 2019-07-04 NOTE — Progress Notes (Signed)
[facility] NEPHROLOGY PROGRESS NOTE  Assessment/ Plan: Pt is a 69 y.o. yo male already on HD MWF CHF, HTN with Citrobacter sepsis, hypotension and thrombocytopenia.  #ESRD MWF: He was on CRRT from 2/15-2/21 and then regular IHD on 2/26.  Blood pressure remained low therefore transition back to CRRT on 2/28.  Tolerating well.  On 4K bath.  UF goal 50 to 100 cc an hour.  Discussed with ICU nurse.  # Anemia of CKD: Continue ESA.  Monitor hemoglobin. Continue ESA, transfuse as needed.   #Citrobacter sepsis: Seen by urologist and thought to be pyelocystitis status post Foley drainage.  Completed antibiotics.  #Hypotension: Currently on steroid, midodrine and phenylephrine.  Monitor blood pressure.  UF as tolerated.  # Secondary hyperparathyroidism: Low phosphorus.  I will replete potassium phosphate.  # Disp: noted palliative care consulted for [place]. Full code.   Subjective: Seen and examined in ICU.  On phenylephrine for hypotension.  Tolerating CRRT, no problem with filter.  Feels weak.  No chest pain or shortness of breath. Objective Vital signs in last 24 hours: Vitals:   07/04/19 0545 07/04/19 0600 07/04/19 0615 07/04/19 0700  BP: (!) 108/44 (!) 100/39 (!) 107/39 (!) 103/42  Pulse: 73 66 65 (!) 52  Resp: 19 13 14 11   Temp:      TempSrc:      SpO2: 100% 100% 100% 100%  Weight:      Height:       Weight change: 2.5 kg  Intake/Output Summary (Last 24 hours) at 07/04/2019 0841 Last data filed at 07/04/2019 0800 Gross per 24 hour  Intake 2352.51 ml  Output 2839 ml  Net -486.49 ml       Labs: Basic Metabolic Panel: Recent Labs  Lab 07/01/19 0745 07/03/19 0723 07/03/19 1839  NA 134* 140 138  138  K 3.6 3.9 4.0  3.1*  CL 100 108 105  104  CO2 21* 18* 19*  23  GLUCOSE 101* 124* 124*  149*  BUN 43* 37* 37*  34*  CREATININE 3.88* 3.28* 3.03*  2.55*  CALCIUM 6.8* 7.0* 6.9*  7.2*  PHOS 3.6 2.4* 2.2*  1.8*   Liver Function Tests: Recent Labs  Lab  06/30/19 0432 06/30/19 0432 07/01/19 0646 07/01/19 0646 07/01/19 0745 07/03/19 0723 07/03/19 1839  AST 20  --  25  --   --  30  --   ALT 16  --  7  --   --  5  --   ALKPHOS 70  --  80  --   --  95  --   BILITOT 0.9  --  0.9  --   --  0.7  --   PROT 4.3*  --  4.9*  --   --  4.2*  --   ALBUMIN 2.4*   < > 2.7*   < > 2.5* 2.3* 2.2*  2.3*   < > = values in this interval not displayed.   No results for input(s): LIPASE, AMYLASE in the last 168 hours. No results for input(s): AMMONIA in the last 168 hours. CBC: Recent Labs  Lab 06/28/19 1945 06/28/19 1945 06/29/19 0428 06/29/19 0428 06/30/19 0432 06/30/19 0432 06/30/19 1336 07/01/19 0646 07/03/19 0723  WBC 24.6*   < > 21.6*   < > 16.8*  --   --  19.5* 6.4  NEUTROABS 23.0*   < > 19.9*   < > 15.3*  --   --  17.5* 6.1  HGB 9.0*   < >  8.8*   < > 8.8*   < > 10.3* 10.4* 8.4*  HCT 27.1*   < > 25.9*   < > 25.7*   < > 30.1* 29.9* 23.7*  MCV 88.9  --  88.7  --  85.7  --   --  84.2 84.9  PLT 27*   < > 23*   < > 21*  --   --  23* 6*   < > = values in this interval not displayed.   Cardiac Enzymes: No results for input(s): CKTOTAL, CKMB, CKMBINDEX, TROPONINI in the last 168 hours. CBG: Recent Labs  Lab 07/03/19 1606 07/03/19 2013 07/03/19 2333 07/04/19 0341 07/04/19 0804  GLUCAP 122* 143* 131* 126* 124*    Iron Studies: No results for input(s): IRON, TIBC, TRANSFERRIN, FERRITIN in the last 72 hours. Studies/Results: No results found.  Medications: Infusions: .  prismasol BGK 4/2.5 400 mL/hr at 07/03/19 1224  .  prismasol BGK 4/2.5 200 mL/hr at 07/03/19 1224  . sodium chloride Stopped (07/02/19 1258)  . sodium chloride Stopped (06/30/19 1303)  . feeding supplement (NEPRO CARB STEADY) 50 mL/hr at 07/04/19 0700  . phenylephrine (NEO-SYNEPHRINE) Adult infusion 190 mcg/min (07/04/19 0804)  . prismasol BGK 4/2.5 1,800 mL/hr at 07/04/19 0093    Scheduled Medications: . amiodarone  200 mg Oral BID  . Chlorhexidine Gluconate  Cloth  6 each Topical Q0600  . darbepoetin (ARANESP) injection - NON-DIALYSIS  60 mcg Subcutaneous Q Fri-1800  . famotidine  10 mg Oral Daily  . feeding supplement (PRO-STAT SUGAR FREE 64)  30 mL Per Tube TID  . hydrocortisone  10 mg Oral q AM  . hydrocortisone  20 mg Oral QPM  . mouth rinse  15 mL Mouth Rinse BID  . midodrine  20 mg Oral TID WC  . multivitamin  1 tablet Oral QHS  . sodium chloride flush  10-40 mL Intracatheter Q12H    have reviewed scheduled and prn medications.  Physical Exam: General: Critically ill looking male lying on bed. Heart:RRR, s1s2 nl Lungs:clear b/l, no crackle Abdomen:soft, Non-tender, non-distended Extremities: Trace leg edema. Dialysis Access: Right AVG thrill+, neck temporary catheter in place.  Belissa Kooy Prasad Crystol [place] 07/04/2019,8:41 AM  LOS: 15 days  Pager: [phone]

## 2019-07-04 NOTE — Progress Notes (Signed)
eLink Physician-Brief Progress Note Patient Name: JC VERON DOB: 05-22-1950 MRN: [id number]   Date of Service  07/04/2019  HPI/Events of Note  Notified of frequent loose stools and patient has bed sores. Also with increasing pressor requirement net negative 150 on CRRT  eICU Interventions  Fecal management tube ordered so as to better assess amount of ongoing loss as well and may need to decrease fluid removal. If continues to increase in pressor requirement may need central, bedside RN to inform [place]     Intervention Category Intermediate Interventions: Best-practice therapies (e.g. DVT, beta blocker, etc.);Hypotension - evaluation and management  Shona Needles [place] 07/04/2019, 4:10 AM

## 2019-07-04 NOTE — Progress Notes (Signed)
Nutrition Follow-up  DOCUMENTATION CODES:   Severe malnutrition in context of chronic illness  INTERVENTION:   -D/C Nepro while on CRRT with significant hypotension   -Vital 1.5 @ 55 ml/hr via Cortrak tube (1320 ml/day) 60 ml Prostat TID -Renavite daily   Tube feeding regimen will provide 2580 kcals, 179 grams of protein, 1008 ml free water   NUTRITION DIAGNOSIS:   Severe Malnutrition related to chronic illness(ESRD on HD) as evidenced by severe muscle depletion, severe fat depletion.  Ongoing.  GOAL:   Patient will meet greater than or equal to 90% of their needs  Meeting with TF.   MONITOR:   TF tolerance, PO intake, Supplement acceptance  REASON FOR ASSESSMENT:   Consult Poor PO  ASSESSMENT:   69 yo male admitted with septic shock from fulminant cystitis. PMH includes HTN, CHF, HF, GERD, ESRD on HD, anemia.  Pt discussed during ICU rounds and with RN.  Pt continues to eat very poorly, 5 % of meals. Palliative care consulted, pt prefers full scope of care.  Pt with global deconditioning. Per pt can feed himself but is not hungry but tries to take a couple of bites.   Pt had CRRT 2/15-2/21/21 2/23 unable to get cortrak due to platelets of 9 2/24 pt upgraded to DYS3/thins diet 2/28 transition back to CRRT due to hypotension. Pt on quad strength neo due to volume.   Medications reviewed and include:  Neo @ 150 mcg KPhos x 1   Labs reviewed: K+ 3.1 (L), PO4: 1.5 (L)  CBG's: 126-124-123   TF: Nepro @ 50 with 60 ml Prostat TID Provides: 2640 kcal, 142 grams protein  Diet Order:   Diet Order            DIET DYS 3 Room service appropriate? No; Fluid consistency: Thin  Diet effective now              EDUCATION NEEDS:   Not appropriate for education at this time  Skin:  Skin Assessment: Reviewed RN Assessment  Last BM:  2/24  Height:   Ht Readings from Last 1 Encounters:  06/19/19 6\' 4"  (1.93 m)    Weight:   Wt Readings from Last 1  Encounters:  07/04/19 86.2 kg    BMI:  Body mass index is 23.13 kg/m.  Estimated Nutritional Needs:   Kcal:  2400-2600  Protein:  140-160 gm  Fluid:  1 L  [place] Maiorino P., RD, LDN, CNSC See AMiON for contact information

## 2019-07-04 NOTE — Progress Notes (Signed)
RN called and spoke with [facility] RN.  Alerted them to the fact that neo was steadily being titrated up during shift and that the patient could possibly benefit from central line and an arterial line placement during the daytime.

## 2019-07-04 NOTE — Progress Notes (Signed)
  Speech Language Pathology Treatment: Dysphagia  Patient Details Name: Gregory [name] MRN: [id number] DOB: 10-01-1950 Today's Date: 07/04/2019 Time: 2633-3545 SLP Time Calculation (min) (ACUTE ONLY): 20 min  Assessment / Plan / Recommendation Clinical Impression  Upon arrival, pt stated he was in significant pain and did not want much to eat. RN notified; gave meds just prior to session. With encouragement, pt observed with thin liquids and regular texture solids. No significant s/sx of aspiration with either consistency. Pt noted to have prolonged mastication with regular solids, fatiguing with progression. Pt also required Min-Mod cues to utilize compensatory strategies t/o session. Given pt's level of fatigue while eating such a small amount of food, recommend continuing with Dys 3 for conservation of energy. Pt in agreement with plan.    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  Work-up suggest sepsis without clear source.  CXR on 2/14 reported: "Cardiomegaly.  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 CXR 2/20: Borderline mild congestive heart failure, improved      SLP Plan  Continue with current plan of care       Recommendations  Diet recommendations: Dysphagia 3 (mechanical soft);Thin liquid Liquids provided via: Straw Medication Administration: Crushed with puree Supervision: Patient able to self feed;Staff to assist with self feeding;Full supervision/cueing for compensatory strategies Compensations: Small sips/bites;Follow solids with liquid Postural Changes and/or Swallow Maneuvers: Seated upright 90 degrees                Oral Care Recommendations: Oral care  BID Follow up Recommendations: 24 hour supervision/assistance;Skilled Nursing facility SLP Visit Diagnosis: Dysphagia, oral phase (R13.11) Plan: Continue with current plan of care       [place], Student SLP Office: [phone]  07/04/2019, 12:35 PM

## 2019-07-04 NOTE — Progress Notes (Addendum)
Palliative: [name], Gregory [name], is lying quietly in bed.  He appears acutely/chronically ill and frail.  He will briefly make but not keep eye contact.  He has declined over the weekend and now is requiring continued dialysis.  He is able to make his basic needs known.  His wife, "my better half", Gregory [name] is at bedside.  We talked about [name] acute health problems and the treatment plan.  [name], Gregory [name], is able to accurately tell me what is going on with Gregory [name].  We briefly talk about goals.  At this point, continue full scope/full code. PMT to follow.   Plan:   Continue full scope/full code  25 minutes  Quinn Axe, NP Palliative Medicine Team Team Phone # [phone] Greater than 50% of this time was spent counseling and coordinating care related to the above assessment and plan.

## 2019-07-04 NOTE — Progress Notes (Signed)
[facility] MD aware, call MD if patient reaches the threshold for having a central line placed to accommodate the pressor(s).  Solmon Ice Nickey Canedo DNP  [facility] RN

## 2019-07-04 DEATH — deceased

## 2019-07-05 LAB — RENAL FUNCTION PANEL
Albumin: 2.3 g/dL — ABNORMAL LOW (ref 3.5–5.0)
Albumin: 2.3 g/dL — ABNORMAL LOW (ref 3.5–5.0)
Anion gap: 8 (ref 5–15)
Anion gap: 10 (ref 5–15)
BUN: 28 mg/dL — ABNORMAL HIGH (ref 8–23)
BUN: 43 mg/dL — ABNORMAL HIGH (ref 8–23)
CO2: 26 mmol/L (ref 22–32)
CO2: 24 mmol/L (ref 22–32)
Calcium: 7.2 mg/dL — ABNORMAL LOW (ref 8.9–10.3)
Calcium: 7.2 mg/dL — ABNORMAL LOW (ref 8.9–10.3)
Chloride: 105 mmol/L (ref 98–111)
Chloride: 104 mmol/L (ref 98–111)
Creatinine, Ser: 1.51 mg/dL — ABNORMAL HIGH (ref 0.61–1.24)
Creatinine, Ser: 1.89 mg/dL — ABNORMAL HIGH (ref 0.61–1.24)
GFR calc Af Amer: 53 mL/min — ABNORMAL LOW (ref >60)
GFR calc Af Amer: 41 mL/min — ABNORMAL LOW (ref >60)
GFR calc non Af Amer: 46 mL/min — ABNORMAL LOW (ref >60)
GFR calc non Af Amer: 35 mL/min — ABNORMAL LOW (ref >60)
Glucose, Bld: 115 mg/dL — ABNORMAL HIGH (ref 70–99)
Glucose, Bld: 99 mg/dL (ref 70–99)
Phosphorus: 1.6 mg/dL — ABNORMAL LOW (ref 2.5–4.6)
Phosphorus: 1.5 mg/dL — ABNORMAL LOW (ref 2.5–4.6)
Potassium: 3.1 mmol/L — ABNORMAL LOW (ref 3.5–5.1)
Potassium: 3.4 mmol/L — ABNORMAL LOW (ref 3.5–5.1)
Sodium: 139 mmol/L (ref 135–145)
Sodium: 138 mmol/L (ref 135–145)

## 2019-07-05 LAB — GLUCOSE, CAPILLARY
Glucose-Capillary: 92 mg/dL (ref 70–99)
Glucose-Capillary: 66 mg/dL — ABNORMAL LOW (ref 70–99)
Glucose-Capillary: 122 mg/dL — ABNORMAL HIGH (ref 70–99)
Glucose-Capillary: 78 mg/dL (ref 70–99)
Glucose-Capillary: 85 mg/dL (ref 70–99)
Glucose-Capillary: 94 mg/dL (ref 70–99)
Glucose-Capillary: 87 mg/dL (ref 70–99)

## 2019-07-05 LAB — PREPARE PLATELET PHERESIS: Unit division: 0

## 2019-07-05 LAB — CBC
HCT: 26.4 % — ABNORMAL LOW (ref 39.0–52.0)
Hemoglobin: 8.9 g/dL — ABNORMAL LOW (ref 13.0–17.0)
MCH: 28.9 pg (ref 26.0–34.0)
MCHC: 33.7 g/dL (ref 30.0–36.0)
MCV: 85.7 fL (ref 80.0–100.0)
Platelets: 12 10*3/uL — CL (ref 150–400)
RBC: 3.08 MIL/uL — ABNORMAL LOW (ref 4.22–5.81)
RDW: 19.2 % — ABNORMAL HIGH (ref 11.5–15.5)
WBC: 18.2 10*3/uL — ABNORMAL HIGH (ref 4.0–10.5)
nRBC: 0 % (ref 0.0–0.2)

## 2019-07-05 LAB — BPAM PLATELET PHERESIS
Blood Product Expiration Date: 202103012359
ISSUE DATE / TIME: 202103011528
Unit Type and Rh: 5100

## 2019-07-05 LAB — CULTURE, BLOOD (ROUTINE X 2)
Culture: NO GROWTH
Culture: NO GROWTH
Special Requests: ADEQUATE

## 2019-07-05 LAB — MAGNESIUM: Magnesium: 2.2 mg/dL (ref 1.7–2.4)

## 2019-07-05 LAB — APTT: aPTT: 37 seconds — ABNORMAL HIGH (ref 24–36)

## 2019-07-05 MED ORDER — K PHOS MONO-SOD PHOS DI & MONO 155-852-130 MG PO TABS
500.0000 mg | ORAL_TABLET | Freq: Three times a day (TID) | ORAL | Status: DC
  Filled 2019-07-05: qty 2

## 2019-07-05 MED ORDER — K PHOS MONO-SOD PHOS DI & MONO 155-852-130 MG PO TABS
500.0000 mg | ORAL_TABLET | Freq: Once | ORAL | Status: AC
  Administered 2019-07-05: 500 mg
  Filled 2019-07-05: qty 2

## 2019-07-05 MED ORDER — POTASSIUM PHOSPHATES 15 MMOLE/5ML IV SOLN
30.0000 mmol | Freq: Once | INTRAVENOUS | Status: DC
  Filled 2019-07-05: qty 10

## 2019-07-05 MED FILL — Phenylephrine HCl IV Soln 10 MG/ML: INTRAVENOUS | Qty: 100 | Status: AC

## 2019-07-05 MED FILL — Sodium Chloride IV Soln 0.9%: INTRAVENOUS | Qty: 250 | Status: AC

## 2019-07-05 NOTE — Progress Notes (Signed)
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  He was transferred out of ICU and to [facility] on 2/23.  Called 2/25 for recurrent hypotension, not responsive to fluids/ albumin therefore was transferred back to ICU for vasopressor support.     Past Medical History   has a past medical history of Anemia in chronic kidney disease, Arthritis, CHF (congestive heart failure) ([place]), Chronic back pain, End stage renal disease ([place]), GERD (gastroesophageal reflux disease), Gout, Heart failure with reduced ejection fraction ([place]), and Hypertension. celiac and superior mesenteric artery aneurysms  Significant Hospital Events   2/14 Admit to [facility] 2/14 He developed SVT, was hemodynamically unstable, treated initially with adenosine but ultimately required DCCV around 715. Has remained hypotensive, received albumin and now another 2 L IV fluid resuscitation (5 L total). Interacting but confused, still not back to baseline per wife at bedside 2/15 Stool-like produced from in-out urinary cath 2/16 Urology consult, confirmed pyocystitis and drained bladder 2/22 out of ICU to [facility] on 2/23 2/25 PCCM re-consult given hypotension 2/26 Remains on vasopressors support  2/28 CRRT initiated 3/1 Neo at 150 mcg/min , plts 6K >> transfused  Consults:  Nephrology 2/14 Urology 2/16 Hematology 2/22 Palliative care 2/26   Procedures:  2/14 left IJ HD cath insertion >> 2/16 Bedside bladder aspiration  2/25 foley >> 2/26 (no output or  pus noted)  Significant Diagnostic Tests:  2/14 CXR>>Cardiomegaly.  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 Chronic elevation of the left hemidiaphragm with chronic volume loss at the left lung base.  2/14 CT Abd 1. No acute pulmonary embolism. 2. Small bilateral pleural effusions with adjacent atelectasis. 3. Dilated main pulmonary artery, which may be secondary to pulmonary arterial hypertension. 4. Interval development of wedge-shaped defects involving the spleen, which may represent splenic infarcts. 5. Cholelithiasis with mild gallbladder wall thickening and pericholecystic free fluid, which may be secondary to underlying liver disease. If there is clinical concern for acute cholecystitis, follow-up with ultrasound is recommended. 6. 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 7. 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 8. Additional chronic findings as detailed above. 9. Aortic Atherosclerosis (ICD10-I70.0).  2/16 CT Pelvis with Rectal contrast 1. Negative for colovesicular fistula. Rectal contrast only opacifies the colon. 2. Bladder wall thickening likely from chronic outlet obstruction. The prostate is enlarged. 3. Prominent anasarca.  CT Head 2/14 No acute intracranial finding. Atrophy. Chronic small-vessel ischemic changes. 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  Echo 2/14 . Left ventricular ejection fraction, by estimation, is 60 to 65%. The  left ventricle has normal function. The left ventricle has no regional  wall motion abnormalities. Left ventricular diastolic parameters are  indeterminate.   2. Right ventricular systolic function is normal. The right ventricular  size is normal.   3. Left atrial size was severely dilated.    4. Right atrial size was mildly dilated.  5. The mitral valve is degenerative. Mild mitral valve regurgitation. No  evidence of mitral stenosis.   6. The aortic valve is tricuspid. Aortic valve regurgitation is mild to  moderate. Mild aortic valve stenosis.   7. The inferior vena cava is normal in size with greater than 50%  respiratory variability, suggesting right atrial pressure of 3 mmHg.   RUQ U/S 2/15 1. Both the gallbladder and the CBD appear mildly distended with sludge but no stone identified. No intrahepatic biliary ductal dilatation to strongly suggest acute bile duct obstruction. Some gallbladder wall thickening, although no sonographic Murphy sign elicited to strongly suggest acalculous cholecystitis. 2. If abdominal pain continues then a nuclear medicine hepatobiliary scan would be valuable to exclude acute CBD or cystic duct Obstruction.  2/25 BLE DVT US >> neg 2/25 LUE DVT US >> neg  2/25 CT abd/ pelvis >> 1. Small bilateral pleural effusions. 2. Cardiomegaly with diffuse atherosclerosis of the coronary vessels. 3. Cholelithiasis without cholecystitis. 4. Bilateral renal atrophy consistent with end-stage renal disease. Stable indeterminate 1.2 cm lesion left kidney. 5. High attenuation material within the bladder lumen may be related to excretion of previously administered contrast. Please correlate with urinalysis. 6. Trace simple appearing pelvic free fluid. 7. Stable endoluminal stent graft within the aorta and bilateral common iliac arteries. Evaluation of the vascular lumen is limited without contrast. 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  Micro Data:  2/14 respiratory viral panel>> negative 2/14 blood cultures x2>> Citrobacter, sensitivities below 2/15 UCx Citrobacter pansensitive  2/25 BCx2 >>ng  Antimicrobials:  Flagyl 2/14 only Cefepime 2/14 > 2/15 Vancomycin 2/14 > 2/15 Meropenem 2/15 > 2/16 Ceftriaxone 2/16 >  2/22 Cefazolin 2/23 > 2/27  Interim history/subjective:   Remains on CRRT, bear hugger Critically ill, Neo-Synephrine drip at 120 mics   Objective   Blood pressure (!) 111/45, pulse 62, temperature (!) 97.5 F (36.4 C), temperature source Axillary, resp. rate 17, height 6\' 4"  (1.93 m), weight 83.5 kg, SpO2 100 %.        Intake/Output Summary (Last 24 hours) at 07/05/2019 0955 Last data filed at 07/05/2019 0900 Gross per 24 hour  Intake 2966.07 ml  Output 4269 ml  Net -1302.93 ml   Filed Weights   07/03/19 0500 07/04/19 0500 07/05/19 0500  Weight: 83.7 kg 86.2 kg 83.5 kg   General:  Chronically ill, frail elderly male sitting in bed in CRRT in NAD HEENT: MM pink/moist, no JVD, left IJ dialysis catheter Neuro: Awake, appropriate, pleasant today, MAE , appears weak and deconditioned CV: rr, no rub or murmur PULM: No accessory muscle use, decreased breath sounds bilateral GI: soft, NT, ND, +bs, flexiseal in  Extremities: warm/dry, no edema  Skin: no rashes   Labs show hypokalemia and hypophosphatemia, stable anemia and severe thrombocytopenia slightly improved from 6K to [facility] Problem list   Septic Shock 2/2 to Citrobacter bacteremia  Acute Metabolic encephalopathy  Assessment & Plan:  Pyocystitis -resolved, no evidence of colovesical fistula Hypotension --Treating for adrenal insufficiency although doubt, doubt ongoing sepsis, no evidence of pus collection in his abdomen.Echo shows normal LV function with normal RVSP P:   monitoring clinically off abx  Neo for SBP goal of 100 , trying to avoid arterial line related to severe thrombocytopenia Continue midodrine TID Continue oral ral hydrocortisone initiated 2/27  ESRD P: Nephrology following, appreciate assistance dc CRRT If comes off pressors then can transition to intermittent dialysis Hypokalemia and hypophosphatemia will be repleted  orally  Severe thrombocytopenia - likely due to sepsis or  antibiotics, overall not concerning for TTP/ HUS  P:  Goal platelets 10K or higher , transfuse if lower Seen by hematology  Afib/RVR - new onset, CHADSVASC2 score 3, not candidate for [facility] given severe thrombocytopenia  P:  Tele monitoring, rate control Continue PO amiodarone   Protein calorie malnutrition, dysphagia Immobility  P:  Cleared for dysphagia 1 diet however due to poor intake patient remains on tube feeds via cortrak Encourage oral intake as able Encourage PT/OT   Celiac and superior mesenteric artery aneurysms -On CT scanning 05/2019 aneurysmal dilatation of bilateral common iliac arteries again noted. Left measures 3.5 cm in diameter, R 3.3 cm.  P:  Follow-up with vascular as an outpatient  Left kidney mass P:  Follow-up in the outpatient setting  Best practice:  Diet: Dysphagia 1 diet, supplemental tube feeds as well Pain/Anxiety/Delirium protocol (if indicated): N/A VAP protocol (if indicated): N/A DVT prophylaxis: SCDs GI prophylaxis: PPI Glucose control: CBG q 4  Mobility: Bedrest Code Status: PMT consulted 2/26, appreciate, patient wishes to remain full code, patient was independent in ADLs prior to this admission Family Communication: Wife, Mickel Baas, at bedside updated Disposition: ICU  The patient is critically ill with multiple organ systems failure and requires high complexity decision making for assessment and support, frequent evaluation and titration of therapies, application of advanced monitoring technologies and extensive interpretation of multiple databases. Critical Care Time devoted to patient care services described in this note independent of APP/resident  time is 31 minutes.     Kara Mead MD. [facility]. [facility]  If no response to pager , please call [phone]   07/05/2019

## 2019-07-05 NOTE — Progress Notes (Addendum)
[facility] NEPHROLOGY PROGRESS NOTE  Assessment/ Plan: Pt is a 69 y.o. yo male already on HD MWF CHF, HTN with Citrobacter sepsis, hypotension and thrombocytopenia.  #ESRD MWF: He was on CRRT from 2/15-2/21 and then regular IHD on 2/26.  Blood pressure remained low therefore transition back to CRRT on 2/28.  Tolerating well.  Volume status looks acceptable and has no uremia therefore I will discontinue CRRT today. Assess for IHD tomorrow.   # Anemia of CKD: Continue ESA.  Monitor hemoglobin. Continue ESA, transfuse as needed.  Platelet count very low.   #Citrobacter sepsis: Seen by urologist and thought to be pyelocystitis status post Foley drainage.  Completed antibiotics.  #Hypotension: Currently on steroid, midodrine and phenylephrine.  Monitor blood pressure.    # Secondary hyperparathyroidism: Low phosphorus.  Problem with IV access therefore starting oral K-Phos.  # Disp: noted palliative care consulted for [place]. Full code.   Discussed with ICU team.  Subjective: Seen and examined in ICU.  On phenylephrine for hypotension.  Tolerating CRRT, no problem with filter.  No new event.    Objective Vital signs in last 24 hours: Vitals:   07/05/19 0815 07/05/19 0830 07/05/19 0845 07/05/19 0900  BP: (!) 122/50 (!) 114/42 (!) 113/43 (!) 111/45  Pulse: 65 61 62 62  Resp: 16 14 (!) 21 17  Temp:      TempSrc:      SpO2: 100% 100% 100% 100%  Weight:      Height:       Weight change: -2.7 kg  Intake/Output Summary (Last 24 hours) at 07/05/2019 0915 Last data filed at 07/05/2019 0900 Gross per 24 hour  Intake 2966.07 ml  Output 4269 ml  Net -1302.93 ml       Labs: Basic Metabolic Panel: Recent Labs  Lab 07/04/19 0647 07/04/19 1643 07/05/19 0504  NA 139 139 139  K 3.1* 3.2* 3.1*  CL 107 106 105  CO2 23 25 26   GLUCOSE 97 126* 115*  BUN 29* 28* 28*  CREATININE 2.20* 1.76* 1.51*  CALCIUM 7.3* 7.0* 7.2*  PHOS 1.5* 1.5* 1.6*   Liver Function Tests: Recent  Labs  Lab 06/30/19 0432 06/30/19 0432 07/01/19 0646 07/01/19 0745 07/03/19 0723 07/03/19 1839 07/04/19 0647 07/04/19 1643 07/05/19 0504  AST 20  --  25  --  30  --   --   --   --   ALT 16  --  7  --  5  --   --   --   --   ALKPHOS 70  --  80  --  95  --   --   --   --   BILITOT 0.9  --  0.9  --  0.7  --   --   --   --   PROT 4.3*  --  4.9*  --  4.2*  --   --   --   --   ALBUMIN 2.4*   < > 2.7*   < > 2.3*   < > 2.3* 2.3* 2.3*   < > = values in this interval not displayed.   No results for input(s): LIPASE, AMYLASE in the last 168 hours. No results for input(s): AMMONIA in the last 168 hours. CBC: Recent Labs  Lab 06/29/19 0428 06/29/19 0428 06/30/19 0432 06/30/19 1336 07/01/19 0646 07/03/19 0723 07/05/19 0504  WBC 21.6*   < > 16.8*  --  19.5* 6.4 18.2*  NEUTROABS 19.9*   < > 15.3*  --  17.5* 6.1  --   HGB 8.8*   < > 8.8*   < > 10.4* 8.4* 8.9*  HCT 25.9*   < > 25.7*   < > 29.9* 23.7* 26.4*  MCV 88.7  --  85.7  --  84.2 84.9 85.7  PLT 23*   < > 21*  --  23* 6* 12*   < > = values in this interval not displayed.   Cardiac Enzymes: No results for input(s): CKTOTAL, CKMB, CKMBINDEX, TROPONINI in the last 168 hours. CBG: Recent Labs  Lab 07/04/19 1935 07/04/19 2323 07/05/19 0334 07/05/19 0758 07/05/19 0903  GLUCAP 113* 90 92 66* 122*    Iron Studies: No results for input(s): IRON, TIBC, TRANSFERRIN, FERRITIN in the last 72 hours. Studies/Results: No results found.  Medications: Infusions: .  prismasol BGK 4/2.5 400 mL/hr at 07/05/19 0425  .  prismasol BGK 4/2.5 200 mL/hr at 07/04/19 1425  . sodium chloride Stopped (07/02/19 1258)  . sodium chloride Stopped (06/30/19 1303)  . feeding supplement (VITAL 1.5 CAL) 1,000 mL (07/05/19 0909)  . phenylephrine (NEO-SYNEPHRINE) Adult infusion 125 mcg/min (07/05/19 0900)  . potassium PHOSPHATE IVPB (in mmol) Stopped (07/05/19 0830)  . prismasol BGK 4/2.5 1,800 mL/hr at 07/05/19 6270    Scheduled Medications: .  amiodarone  200 mg Oral BID  . Chlorhexidine Gluconate Cloth  6 each Topical Q0600  . darbepoetin (ARANESP) injection - NON-DIALYSIS  60 mcg Subcutaneous Q Fri-1800  . famotidine  20 mg Oral Daily  . feeding supplement (PRO-STAT SUGAR FREE 64)  60 mL Per Tube TID  . hydrocortisone  10 mg Oral q AM  . hydrocortisone  20 mg Oral QPM  . mouth rinse  15 mL Mouth Rinse BID  . midodrine  20 mg Oral TID WC  . multivitamin  1 tablet Oral QHS  . sodium chloride flush  10-40 mL Intracatheter Q12H    have reviewed scheduled and prn medications.  Physical Exam: General: Not in distress, in room air Heart:RRR, s1s2 nl Lungs:clear b/l, no crackle Abdomen:soft, Non-tender, non-distended Extremities: No leg edema. Dialysis Access: Right AVG thrill+, neck temporary catheter in place.  Gregory [name] 07/05/2019,9:15 AM  LOS: 16 days  Pager: [phone]

## 2019-07-06 LAB — RENAL FUNCTION PANEL
Albumin: 1.9 g/dL — ABNORMAL LOW (ref 3.5–5.0)
Albumin: 2.1 g/dL — ABNORMAL LOW (ref 3.5–5.0)
Anion gap: 10 (ref 5–15)
Anion gap: 10 (ref 5–15)
BUN: 64 mg/dL — ABNORMAL HIGH (ref 8–23)
BUN: 79 mg/dL — ABNORMAL HIGH (ref 8–23)
CO2: 24 mmol/L (ref 22–32)
CO2: 25 mmol/L (ref 22–32)
Calcium: 7.1 mg/dL — ABNORMAL LOW (ref 8.9–10.3)
Calcium: 7.2 mg/dL — ABNORMAL LOW (ref 8.9–10.3)
Chloride: 103 mmol/L (ref 98–111)
Chloride: 103 mmol/L (ref 98–111)
Creatinine, Ser: 2.44 mg/dL — ABNORMAL HIGH (ref 0.61–1.24)
Creatinine, Ser: 2.94 mg/dL — ABNORMAL HIGH (ref 0.61–1.24)
GFR calc Af Amer: 30 mL/min — ABNORMAL LOW (ref >60)
GFR calc Af Amer: 24 mL/min — ABNORMAL LOW (ref >60)
GFR calc non Af Amer: 26 mL/min — ABNORMAL LOW (ref >60)
GFR calc non Af Amer: 21 mL/min — ABNORMAL LOW (ref >60)
Glucose, Bld: 106 mg/dL — ABNORMAL HIGH (ref 70–99)
Glucose, Bld: 99 mg/dL (ref 70–99)
Phosphorus: 1.6 mg/dL — ABNORMAL LOW (ref 2.5–4.6)
Phosphorus: 1.9 mg/dL — ABNORMAL LOW (ref 2.5–4.6)
Potassium: 3.4 mmol/L — ABNORMAL LOW (ref 3.5–5.1)
Potassium: 3.5 mmol/L (ref 3.5–5.1)
Sodium: 137 mmol/L (ref 135–145)
Sodium: 138 mmol/L (ref 135–145)

## 2019-07-06 LAB — RETICULOCYTES
Immature Retic Fract: 13.5 % (ref 2.3–15.9)
RBC.: 2.48 MIL/uL — ABNORMAL LOW (ref 4.22–5.81)
Retic Count, Absolute: 38.7 10*3/uL (ref 19.0–186.0)
Retic Ct Pct: 1.6 % (ref 0.4–3.1)

## 2019-07-06 LAB — GLUCOSE, CAPILLARY
Glucose-Capillary: 100 mg/dL — ABNORMAL HIGH (ref 70–99)
Glucose-Capillary: 100 mg/dL — ABNORMAL HIGH (ref 70–99)
Glucose-Capillary: 110 mg/dL — ABNORMAL HIGH (ref 70–99)
Glucose-Capillary: 114 mg/dL — ABNORMAL HIGH (ref 70–99)
Glucose-Capillary: 87 mg/dL (ref 70–99)
Glucose-Capillary: 99 mg/dL (ref 70–99)

## 2019-07-06 LAB — CBC
HCT: 21.5 % — ABNORMAL LOW (ref 39.0–52.0)
Hemoglobin: 7.3 g/dL — ABNORMAL LOW (ref 13.0–17.0)
MCH: 29.4 pg (ref 26.0–34.0)
MCHC: 34 g/dL (ref 30.0–36.0)
MCV: 86.7 fL (ref 80.0–100.0)
Platelets: 11 10*3/uL — CL (ref 150–400)
RBC: 2.48 MIL/uL — ABNORMAL LOW (ref 4.22–5.81)
RDW: 19.6 % — ABNORMAL HIGH (ref 11.5–15.5)
WBC: 14.2 10*3/uL — ABNORMAL HIGH (ref 4.0–10.5)
nRBC: 0 % (ref 0.0–0.2)

## 2019-07-06 LAB — MAGNESIUM: Magnesium: 2 mg/dL (ref 1.7–2.4)

## 2019-07-06 LAB — APTT: aPTT: 37 seconds — ABNORMAL HIGH (ref 24–36)

## 2019-07-06 LAB — IMMATURE PLATELET FRACTION: Immature Platelet Fraction: 22.5 % — ABNORMAL HIGH (ref 1.2–8.6)

## 2019-07-06 MED ORDER — K PHOS MONO-SOD PHOS DI & MONO 155-852-130 MG PO TABS
500.0000 mg | ORAL_TABLET | Freq: Two times a day (BID) | ORAL | Status: AC
  Administered 2019-07-06 (×2): 500 mg
  Filled 2019-07-06 (×2): qty 2

## 2019-07-06 MED ORDER — FAMOTIDINE 20 MG PO TABS
10.0000 mg | ORAL_TABLET | Freq: Every day | ORAL | Status: DC
  Administered 2019-07-07 – 2019-07-14 (×6): 10 mg via ORAL
  Filled 2019-07-06 (×7): qty 1

## 2019-07-06 NOTE — Progress Notes (Signed)
Physical Therapy Treatment Patient Details Name: Gregory [name] MRN: [id number] DOB: [birth year] Today's Date: 07/06/2019    History of Present Illness 69yo male with 2 days of lethargy and confusion/AMS. CTH negative, negative for PE, covid negative at admission. Admitted for sepsis workup, encephalopathy. PMH ESRD on HD, anemia, CHF, HTN, celiac and mesenteric artery aneurysms, iliac artery aneurysm s/p graft repair    PT Comments    Pt looking forward to working with therapy today.  Emphasis on warm up exercise, transition to the EOB, balance, standing trials and transfers to the chair.    Follow Up Recommendations  CIR;Supervision/Assistance - 24 hour     Equipment Recommendations  Other (comment)(TBA)    Recommendations for Other Services       Precautions / Restrictions Precautions Precautions: Fall Restrictions Weight Bearing Restrictions: No    Mobility  Bed Mobility Overal bed mobility: Needs Assistance Bed Mobility: Rolling;Sidelying to Sit;Sit to Supine Rolling: Min assist Sidelying to sit: Max assist;+2 for safety/equipment   Sit to supine: Max assist;+2 for safety/equipment   General bed mobility comments: assist for LEs and trunk elevation, VCs for pt to self assist with transitions  Transfers Overall transfer level: Needs assistance Equipment used: Rolling walker (2 wheeled) Transfers: Sit to/from Stand Sit to Stand: Max assist;+2 physical assistance;+2 safety/equipment         General transfer comment: boosting assist from EOB with bedpad utlized for increased hip support, requires assist/facilitation to promote full upright   Ambulation/Gait             General Gait Details: not able   Stairs             Wheelchair Mobility    Modified Rankin (Stroke Patients Only)       Balance Overall balance assessment: Needs assistance Sitting-balance support: No upper extremity supported;Feet supported Sitting balance-Leahy Scale:  Fair     Standing balance support: Bilateral upper extremity supported Standing balance-Leahy Scale: Poor Standing balance comment: UE support and external assist in to the RW.  Working on Upright posture and bil knee control.                              Cognition Arousal/Alertness: Awake/alert Behavior During Therapy: WFL for tasks assessed/performed;Flat affect Overall Cognitive Status: Impaired/Different from baseline Area of Impairment: Attention;Following commands;Awareness                   Current Attention Level: Sustained Memory: Decreased recall of precautions Following Commands: Follows one step commands with increased time   Awareness: Emergent Problem Solving: Slow processing;Decreased initiation;Requires verbal cues;Requires tactile cues        Exercises General Exercises - Upper Extremity Shoulder Flexion: Both;10 reps;Supine;AROM;AAROM Elbow Flexion: AROM;Both;10 reps;Supine Elbow Extension: AROM;Both;10 reps;Supine Digit Composite Flexion: AROM;Both;10 reps;Supine General Exercises - Lower Extremity Ankle Circles/Pumps: AROM;Both;10 reps;Supine Quad Sets: AROM;Both;10 reps Heel Slides: AROM;Both;10 reps;Supine(graded resistance) Hip ABduction/ADduction: AROM;Both;10 reps;Supine;AAROM    General Comments        Pertinent Vitals/Pain Pain Assessment: No/denies pain    Home Living                      Prior Function            PT Goals (current goals can now be found in the care plan section) Acute Rehab PT Goals Patient Stated Goal: go home PT Goal Formulation: With patient/family Time For Goal Achievement: 07/12/19 Potential  to Achieve Goals: Fair Progress towards PT goals: Progressing toward goals    Frequency    Min 3X/week      PT Plan Current plan remains appropriate    Co-evaluation PT/OT/SLP Co-Evaluation/Treatment: Yes Reason for Co-Treatment: Complexity of the patient's impairments (multi-system  involvement) PT goals addressed during session: Mobility/safety with mobility;Strengthening/ROM OT goals addressed during session: Strengthening/ROM      AM-PAC PT "6 Clicks" Mobility   Outcome Measure  Help needed turning from your back to your side while in a flat bed without using bedrails?: Total Help needed moving from lying on your back to sitting on the side of a flat bed without using bedrails?: Total Help needed moving to and from a bed to a chair (including a wheelchair)?: Total Help needed standing up from a chair using your arms (e.g., wheelchair or bedside chair)?: Total Help needed to walk in hospital room?: Total Help needed climbing 3-5 steps with a railing? : Total 6 Click Score: 6    End of Session   Activity Tolerance: Patient tolerated treatment well;Patient limited by fatigue Patient left: with call bell/phone within reach;in bed;with bed alarm set;with family/visitor present Nurse Communication: Mobility status PT Visit Diagnosis: Muscle weakness (generalized) (M62.81);Other abnormalities of gait and mobility (R26.89);Difficulty in walking, not elsewhere classified (R26.2)     Time: 9735-3299 PT Time Calculation (min) (ACUTE ONLY): 32 min  Charges:  $Therapeutic Activity: 8-22 mins                     07/06/2019  Gregory [name]., PT Acute Rehabilitation Services [phone]  (pager) [phone]  (office)   Gregory [name] Gregory [name] 07/06/2019, 6:04 PM

## 2019-07-06 NOTE — Progress Notes (Signed)
Occupational Therapy Treatment Patient Details Name: Gregory [name] MRN: [id number] DOB: [birth year] Today's Date: 07/06/2019    History of present illness 69yo male with 2 days of lethargy and confusion/AMS. CTH negative, negative for PE, covid negative at admission. Admitted for sepsis workup, encephalopathy. PMH ESRD on HD, anemia, CHF, HTN, celiac and mesenteric artery aneurysms, iliac artery aneurysm s/p graft repair   OT comments  Pt making progress towards OT goals, seen in conjunction with PT to progress to EOB/standing trials. Pt tolerating x2 sit<>stand from EOB with two person assist to RW. Pt tolerating well but fatigues quickly and requires seated rest. Pt reports only mild dizziness with sitting/standing today. Pericare completed by RN during standing (totalA). Pt remains motivated to work and progress with therapies, spouse present and supportive throughout. Continue to recommend CIR at time of discharge. Will follow.    Follow Up Recommendations  CIR;Supervision/Assistance - 24 hour    Equipment Recommendations  Other (comment)(TBD)          Precautions / Restrictions Precautions Precautions: Fall Restrictions Weight Bearing Restrictions: No       Mobility Bed Mobility Overal bed mobility: Needs Assistance Bed Mobility: Rolling;Sidelying to Sit;Sit to Supine Rolling: Min assist Sidelying to sit: Max assist;+2 for safety/equipment   Sit to supine: Max assist;+2 for safety/equipment   General bed mobility comments: assist for LEs and trunk elevation, VCs for pt to self assist with transitions  Transfers Overall transfer level: Needs assistance Equipment used: Rolling walker (2 wheeled) Transfers: Sit to/from Stand Sit to Stand: Max assist;+2 physical assistance;+2 safety/equipment         General transfer comment: boosting assist from EOB with bedpad utlized for increased hip support, requires assist/facilitation to promote full upright     Balance  Overall balance assessment: Needs assistance Sitting-balance support: No upper extremity supported;Feet supported Sitting balance-Leahy Scale: Fair     Standing balance support: Bilateral upper extremity supported Standing balance-Leahy Scale: Poor Standing balance comment: UE support and external assist                           ADL either performed or assessed with clinical judgement   ADL Overall ADL's : Needs assistance/impaired                     Lower Body Dressing: Maximal assistance;Total assistance;+2 for physical assistance;+2 for safety/equipment;Sit to/from stand Lower Body Dressing Details (indicate cue type and reason): to don socks     Toileting- Clothing Manipulation and Hygiene: Total assistance Toileting - Clothing Manipulation Details (indicate cue type and reason): totalA for pericare while standing with +2 assist     Functional mobility during ADLs: Maximal assistance;+2 for physical assistance;+2 for safety/equipment;Rolling walker(sit<>stand) General ADL Comments: pt able to tolerate standing trials this session, continues to have limitations due to weakness, poor endurance                       Cognition Arousal/Alertness: Awake/alert Behavior During Therapy: WFL for tasks assessed/performed;Flat affect Overall Cognitive Status: Impaired/Different from baseline Area of Impairment: Attention;Following commands;Awareness                   Current Attention Level: Sustained Memory: Decreased recall of precautions Following Commands: Follows one step commands with increased time   Awareness: Emergent Problem Solving: Slow processing;Decreased initiation;Requires verbal cues;Requires tactile cues          Exercises Exercises: General  Lower Extremity General Exercises - Upper Extremity Shoulder Flexion: Both;10 reps;Supine;AROM;AAROM Elbow Flexion: AROM;Both;10 reps;Supine Elbow Extension: AROM;Both;10  reps;Supine Digit Composite Flexion: AROM;Both;10 reps;Supine General Exercises - Lower Extremity Ankle Circles/Pumps: AROM;Both;10 reps;Supine Quad Sets: AROM;Both;10 reps   Shoulder Instructions       General Comments      Pertinent Vitals/ Pain       Pain Assessment: No/denies pain  Home Living                                          Prior Functioning/Environment              Frequency  Min 2X/week        Progress Toward Goals  OT Goals(current goals can now be found in the care plan section)  Progress towards OT goals: Progressing toward goals  Acute Rehab OT Goals Patient Stated Goal: go home OT Goal Formulation: With patient Time For Goal Achievement: 07/12/19 Potential to Achieve Goals: Good  Plan Discharge plan remains appropriate    Co-evaluation    PT/OT/SLP Co-Evaluation/Treatment: Yes Reason for Co-Treatment: Complexity of the patient's impairments (multi-system involvement);For patient/therapist safety;To address functional/ADL transfers   OT goals addressed during session: Strengthening/ROM      AM-PAC OT "6 Clicks" Daily Activity     Outcome Measure   Help from another person eating meals?: A Lot Help from another person taking care of personal grooming?: A Lot Help from another person toileting, which includes using toliet, bedpan, or urinal?: Total Help from another person bathing (including washing, rinsing, drying)?: A Lot Help from another person to put on and taking off regular upper body clothing?: A Lot Help from another person to put on and taking off regular lower body clothing?: Total 6 Click Score: 10    End of Session Equipment Utilized During Treatment: Rolling walker  OT Visit Diagnosis: Other abnormalities of gait and mobility (R26.89);Muscle weakness (generalized) (M62.81);Other symptoms and signs involving cognitive function   Activity Tolerance Patient tolerated treatment well   Patient Left in  bed;with call bell/phone within reach;with family/visitor present   Nurse Communication Mobility status        Time: 1359-1431 OT Time Calculation (min): 32 min  Charges: OT General Charges $OT Visit: 1 Visit OT Treatments $Therapeutic Activity: 8-22 mins  Lou Cal, OT Acute Rehabilitation Services Pager [phone] Office [phone]    Raymondo Band 07/06/2019, 5:16 PM

## 2019-07-06 NOTE — Progress Notes (Signed)
[facility] NEPHROLOGY PROGRESS NOTE  Assessment/ Plan: Pt is a 69 y.o. yo male already on HD MWF CHF, HTN with Citrobacter sepsis, hypotension and thrombocytopenia.  #ESRD MWF: He was on CRRT from 2/15-2/21 and then regular IHD on 2/26.  Blood pressure remained low therefore transition back to CRRT on 2/28 to 3/2.   -He looks euvolemic on exam and potassium level is low.  Not uremic.  Hold off on dialysis today and assess in the morning.  Continue to keep temporary HD catheter especially since his blood pressure is still low on Levophed.  # Anemia of CKD: Continue ESA.  Monitor hemoglobin. Continue ESA, transfuse as needed.  Platelet count very low.   #Citrobacter sepsis: Seen by urologist and thought to be pyelocystitis status post Foley drainage.  Completed antibiotics.  #Hypotension: Currently on steroid, midodrine and phenylephrine.  Monitor blood pressure.    # Secondary hyperparathyroidism: Low phosphorus.  Problem with IV access therefore starting oral K-Phos.  # Disp: noted palliative care consulted for [place]. Full code.   Discussed with ICU team.  Subjective: Seen and examined in ICU.  Still requiring phenylephrine with soft blood pressure.  No new event.  Denies nausea vomiting chest pain shortness of breath.    Objective Vital signs in last 24 hours: Vitals:   07/06/19 0430 07/06/19 0445 07/06/19 0500 07/06/19 0800  BP: (!) 112/41 (!) 102/47 (!) 102/43   Pulse: 76 76 81   Resp: 16 16 17    Temp:    98.8 F (37.1 C)  TempSrc:    Oral  SpO2: 100% 100% 100%   Weight:   84.6 kg   Height:       Weight change: 1.1 kg  Intake/Output Summary (Last 24 hours) at 07/06/2019 0900 Last data filed at 07/06/2019 0500 Gross per 24 hour  Intake 1283.03 ml  Output 825 ml  Net 458.03 ml       Labs: Basic Metabolic Panel: Recent Labs  Lab 07/05/19 0504 07/05/19 1815 07/06/19 0532  NA 139 138 137  K 3.1* 3.4* 3.4*  CL 105 104 103  CO2 26 24 24   GLUCOSE 115*  99 106*  BUN 28* 43* 64*  CREATININE 1.51* 1.89* 2.44*  CALCIUM 7.2* 7.2* 7.1*  PHOS 1.6* 1.5* 1.6*   Liver Function Tests: Recent Labs  Lab 06/30/19 0432 06/30/19 0432 07/01/19 0646 07/01/19 0745 07/03/19 0723 07/03/19 1839 07/05/19 0504 07/05/19 1815 07/06/19 0532  AST 20  --  25  --  30  --   --   --   --   ALT 16  --  7  --  5  --   --   --   --   ALKPHOS 70  --  80  --  95  --   --   --   --   BILITOT 0.9  --  0.9  --  0.7  --   --   --   --   PROT 4.3*  --  4.9*  --  4.2*  --   --   --   --   ALBUMIN 2.4*   < > 2.7*   < > 2.3*   < > 2.3* 2.3* 1.9*   < > = values in this interval not displayed.   No results for input(s): LIPASE, AMYLASE in the last 168 hours. No results for input(s): AMMONIA in the last 168 hours. CBC: Recent Labs  Lab 06/30/19 0432 06/30/19 1336 07/01/19 0646 07/01/19  1610 07/03/19 0723 07/05/19 0504 07/06/19 0532  WBC 16.8*  --  19.5*   < > 6.4 18.2* 14.2*  NEUTROABS 15.3*  --  17.5*  --  6.1  --   --   HGB 8.8*   < > 10.4*   < > 8.4* 8.9* 7.3*  HCT 25.7*   < > 29.9*   < > 23.7* 26.4* 21.5*  MCV 85.7  --  84.2  --  84.9 85.7 86.7  PLT 21*  --  23*   < > 6* 12* 11*   < > = values in this interval not displayed.   Cardiac Enzymes: No results for input(s): CKTOTAL, CKMB, CKMBINDEX, TROPONINI in the last 168 hours. CBG: Recent Labs  Lab 07/05/19 1541 07/05/19 1959 07/05/19 2316 07/06/19 0322 07/06/19 0757  GLUCAP 85 94 87 100* 99    Iron Studies: No results for input(s): IRON, TIBC, TRANSFERRIN, FERRITIN in the last 72 hours. Studies/Results: No results found.  Medications: Infusions: . sodium chloride Stopped (07/02/19 1258)  . sodium chloride Stopped (06/30/19 1303)  . feeding supplement (VITAL 1.5 CAL) 1,000 mL (07/06/19 0627)  . phenylephrine (NEO-SYNEPHRINE) Adult infusion 10 mcg/min (07/06/19 0400)    Scheduled Medications: . amiodarone  200 mg Oral BID  . Chlorhexidine Gluconate Cloth  6 each Topical Q0600  .  darbepoetin (ARANESP) injection - NON-DIALYSIS  60 mcg Subcutaneous Q Fri-1800  . famotidine  20 mg Oral Daily  . feeding supplement (PRO-STAT SUGAR FREE 64)  60 mL Per Tube TID  . hydrocortisone  10 mg Oral q AM  . hydrocortisone  20 mg Oral QPM  . mouth rinse  15 mL Mouth Rinse BID  . midodrine  20 mg Oral TID WC  . multivitamin  1 tablet Oral QHS  . sodium chloride flush  10-40 mL Intracatheter Q12H    have reviewed scheduled and prn medications.  Physical Exam: General: Not in distress, in room air. Heart:RRR, s1s2 nl Lungs:clear b/l, no crackle Abdomen:soft, Non-tender, non-distended Extremities: No leg edema. Dialysis Access: Right AVG thrill+, left IJ neck temporary catheter in place.  Vandell Kun Prasad Xcaret Morad 07/06/2019,9:00 AM  LOS: 17 days  Pager: [phone]

## 2019-07-06 NOTE — Progress Notes (Signed)
  Speech Language Pathology Treatment: Dysphagia  Patient Details Name: Gregory [name] MRN: [id number] DOB: 02-21-1951 Today's Date: 07/06/2019 Time: 6237-6283 SLP Time Calculation (min) (ACUTE ONLY): 15 min  Assessment / Plan / Recommendation Clinical Impression  Pt upright in bed for session, self feeding. Pt observed with thin liquids and regular texture solids. Pt noted with continued prolonged mastication, but remains functional. No s/sx of aspiration noted with either consistency. SLP provided the option of upgrading his diet to regular texture solids, and pt agreed he would like to upgrade. Ensure pt is upright for meals, utilizing liquid wash PRN, and takes rest breaks to conserve his energy as needed. All goals met, SLP will s/o at this time.    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  Work-up suggest sepsis without clear source.  CXR on 2/14 reported: "Cardiomegaly.  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 CXR 2/20: Borderline mild congestive heart failure, improved      SLP Plan  All goals met       Recommendations  Diet recommendations: Regular;Thin liquid Liquids provided via: Straw;Cup Medication Administration: Crushed with puree Supervision: Patient able to self feed;Staff to assist with self feeding;Full supervision/cueing for compensatory strategies Compensations: Small sips/bites;Follow solids with liquid Postural Changes and/or Swallow Maneuvers: Seated upright 90 degrees                Oral Care Recommendations: Oral care BID Follow up Recommendations: 24 hour supervision/assistance;Skilled Nursing facility SLP Visit Diagnosis: Dysphagia, oral phase  (R13.11) Plan: All goals met       [place], Student SLP Office: [phone]  07/06/2019, 1:23 PM

## 2019-07-06 NOTE — Progress Notes (Signed)
NAME:  Gregory [name], MRN:  [id number], DOB:  [birth year], LOS: 3 ADMISSION DATE:  06/13/2019, CONSULTATION DATE:  09/38/18 REFERRING MD: Roxanne Mins , CHIEF COMPLAINT:  Lethargy and AMS   Brief History   69 year old male with past medical history significant for ESRD with M/W/F dialysis anemia, heart failure, hypertension, iliac aneurysm status post stent grafting repair, known celiac and superior mesenteric artery aneurysms admitted 2/13 with septic shock with severe thrombocytopenia found to have pyocystitis with Citrobacter bacteremia.  He was transferred out of ICU and to [facility] on 2/23.  Called 2/25 for recurrent hypotension, not responsive to fluids/ albumin therefore was transferred back to ICU for vasopressor support.     Past Medical History   has a past medical history of Anemia in chronic kidney disease, Arthritis, CHF (congestive heart failure) ([place]), Chronic back pain, End stage renal disease ([place]), GERD (gastroesophageal reflux disease), Gout, Heart failure with reduced ejection fraction ([place]), and Hypertension. celiac and superior mesenteric artery aneurysms  Significant Hospital Events   2/14 Admit to [facility] 2/14 He developed SVT, was hemodynamically unstable, treated initially with adenosine but ultimately required DCCV around 715. Has remained hypotensive, received albumin and now another 2 L IV fluid resuscitation (5 L total). Interacting but confused, still not back to baseline per wife at bedside 2/15 Stool-like produced from in-out urinary cath 2/16 Urology consult, confirmed pyocystitis and drained bladder 2/22 out of ICU to [facility] on 2/23 2/25 PCCM re-consult given hypotension 2/26 Remains on vasopressors support  2/28 CRRT initiated 3/1 Neo at 150 mcg/min , plts 6K >> transfused 3/2 Neo-Synephrine drip at 120 mics, CRRT stopped  Consults:  Nephrology 2/14 Urology 2/16 Hematology 2/22 Palliative care 2/26   Procedures:  2/14 left IJ HD cath  >> 2/16 Bedside bladder  aspiration  2/25 foley >> 2/26 (no output or pus noted)  Significant Diagnostic Tests:  2/14 CXR>>Cardiomegaly.  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 Chronic elevation of the left hemidiaphragm with chronic volume loss at the left lung base.  2/14 CT Abd 1. No acute pulmonary embolism. 2. Small bilateral pleural effusions with adjacent atelectasis. 3. Dilated main pulmonary artery, which may be secondary to pulmonary arterial hypertension. 4. Interval development of wedge-shaped defects involving the spleen, which may represent splenic infarcts. 5. Cholelithiasis with mild gallbladder wall thickening and pericholecystic free fluid, which may be secondary to underlying liver disease. If there is clinical concern for acute cholecystitis, follow-up with ultrasound is recommended. 6. 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 7. 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 8. Additional chronic findings as detailed above. 9. Aortic Atherosclerosis (ICD10-I70.0).  2/16 CT Pelvis with Rectal contrast 1. Negative for colovesicular fistula. Rectal contrast only opacifies the colon. 2. Bladder wall thickening likely from chronic outlet obstruction. The prostate is enlarged. 3. Prominent anasarca.  CT Head 2/14 No acute intracranial finding. Atrophy. Chronic small-vessel ischemic changes. 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  Echo 2/14 . Left ventricular ejection fraction, by estimation, is 60 to 65%. The  left ventricle has normal function. The left ventricle has no regional  wall motion abnormalities. Left ventricular diastolic parameters are  indeterminate.   2. Right ventricular systolic function is normal. The right ventricular  size is normal.    3. Left atrial size was severely dilated.  4. Right atrial size was mildly dilated.   5. The mitral valve is degenerative. Mild mitral valve regurgitation. No  evidence of mitral stenosis.   6. The aortic valve is tricuspid. Aortic valve regurgitation is mild to  moderate. Mild aortic valve stenosis.   7. The inferior vena cava is normal in size with greater than 50%  respiratory variability, suggesting right atrial pressure of 3 mmHg.   RUQ U/S 2/15 1. Both the gallbladder and the CBD appear mildly distended with sludge but no stone identified. No intrahepatic biliary ductal dilatation to strongly suggest acute bile duct obstruction. Some gallbladder wall thickening, although no sonographic Murphy sign elicited to strongly suggest acalculous cholecystitis. 2. If abdominal pain continues then a nuclear medicine hepatobiliary scan would be valuable to exclude acute CBD or cystic duct Obstruction.  2/25 BLE DVT US >> neg 2/25 LUE DVT US >> neg  2/25 CT abd/ pelvis >> 1. Small bilateral pleural effusions. 2. Cardiomegaly with diffuse atherosclerosis of the coronary vessels. 3. Cholelithiasis without cholecystitis. 4. Bilateral renal atrophy consistent with end-stage renal disease. Stable indeterminate 1.2 cm lesion left kidney. 5. High attenuation material within the bladder lumen may be related to excretion of previously administered contrast. Please correlate with urinalysis. 6. Trace simple appearing pelvic free fluid. 7. Stable endoluminal stent graft within the aorta and bilateral common iliac arteries. Evaluation of the vascular lumen is limited without contrast. 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  Micro Data:  2/14 respiratory viral panel>> negative 2/14 blood cultures x2>> Citrobacter, sensitivities below 2/15 UCx Citrobacter pansensitive  2/25 BCx2 >>ng  Antimicrobials:  Flagyl 2/14 only Cefepime 2/14 > 2/15 Vancomycin 2/14 > 2/15 Meropenem  2/15 > 2/16 Ceftriaxone 2/16 > 2/22 Cefazolin 2/23 > 2/27  Interim history/subjective:   Off CRRT On low-dose Neo-Synephrine 10 mics Denies pain or dyspnea Afebrile   Objective   Blood pressure (!) 102/43, pulse 81, temperature 98.8 F (37.1 C), temperature source Oral, resp. rate 17, height 6\' 4"  (1.93 m), weight 84.6 kg, SpO2 100 %.        Intake/Output Summary (Last 24 hours) at 07/06/2019 4580 Last data filed at 07/06/2019 0500 Gross per 24 hour  Intake 1283.03 ml  Output 825 ml  Net 458.03 ml   Filed Weights   07/04/19 0500 07/05/19 0500 07/06/19 0500  Weight: 86.2 kg 83.5 kg 84.6 kg   General:  Chronically ill, frail elderly male sitting in bed  HEENT: MM pink/moist, no JVD, left IJ dialysis catheter Neuro: Awake, interactive, MAE , appears weak and deconditioned CV: rr, no rub or murmur PULM: No accessory muscle use, decreased breath sounds bilateral GI: soft, NT, ND, +bs, flexiseal in  Extremities: warm/dry, no edema  Skin: no rashes   Labs show mild hypokalemia and hypophosphatemia, very low albumin 1.9, decreasing leukocytosis, worsening anemia from 8.9-7.3, stable very low platelet count  Resolved Hospital Problem list   Septic Shock 2/2 to Citrobacter bacteremia  Acute Metabolic encephalopathy  Assessment & Plan:  Pyocystitis -resolved, no evidence of colovesical fistula Hypotension --Treating for adrenal insufficiency although doubt, doubt ongoing sepsis, no evidence of pus collection in his abdomen.Echo shows normal LV function with normal RVSP P:   monitoring clinically off abx  Neo for SBP goal of 90 or MAP 55, trying to avoid arterial line related to severe thrombocytopenia Continue midodrine TID Continue oral  hydrocortisone initiated 2/27  ESRD P: Nephrology following, appreciate assistance Off CRRT , if he comes off Neo-Synephrine  by tomorrow then can attempt intermittent dialysis Would like to DC HD cath if not required  hypokalemia and  hypophosphatemia can be repleted orally  Severe thrombocytopenia - likely due to sepsis or antibiotics, overall not concerning for TTP/ HUS  P:  Goal platelets 10K or higher unless bleeding, transfuse 3/1 Seen by hematology  Afib/RVR - new onset, CHADSVASC2 score 3, not candidate for [facility] given severe thrombocytopenia  P:  Tele monitoring, rate control Continue PO amiodarone   Protein calorie malnutrition, dysphagia Immobility  P:  Cleared for dysphagia 1 diet however due to poor intake patient remains on tube feeds via cortrak Encourage oral intake as able Encourage PT/OT   Celiac and superior mesenteric artery aneurysms -On CT scanning 05/2019 aneurysmal dilatation of bilateral common iliac arteries again noted. Left measures 3.5 cm in diameter, R 3.3 cm.  P:  Follow-up with vascular as an outpatient  Left kidney mass P:  Follow-up in the outpatient setting  Best practice:  Diet: Dysphagia 1 diet, supplemental tube feeds as well Pain/Anxiety/Delirium protocol (if indicated): N/A VAP protocol (if indicated): N/A DVT prophylaxis: SCDs GI prophylaxis: PPI Glucose control: CBG q 4  Mobility: Bedrest Code Status: PMT consulted 2/26, patient wishes to remain full code, patient was independent in ADLs prior to this admission Family Communication: Wife, Mickel Baas, at bedside updated Disposition: ICU     Kara Mead MD. FCCP. [facility]  If no response to pager , please call [phone]   07/06/2019

## 2019-07-07 DIAGNOSIS — R5381 Other malaise: Secondary | ICD-10-CM

## 2019-07-07 LAB — GLUCOSE, CAPILLARY
Glucose-Capillary: 104 mg/dL — ABNORMAL HIGH (ref 70–99)
Glucose-Capillary: 102 mg/dL — ABNORMAL HIGH (ref 70–99)
Glucose-Capillary: 94 mg/dL (ref 70–99)
Glucose-Capillary: 105 mg/dL — ABNORMAL HIGH (ref 70–99)
Glucose-Capillary: 123 mg/dL — ABNORMAL HIGH (ref 70–99)
Glucose-Capillary: 135 mg/dL — ABNORMAL HIGH (ref 70–99)

## 2019-07-07 LAB — CBC
HCT: 20.9 % — ABNORMAL LOW (ref 39.0–52.0)
Hemoglobin: 6.9 g/dL — CL (ref 13.0–17.0)
MCH: 29.4 pg (ref 26.0–34.0)
MCHC: 33 g/dL (ref 30.0–36.0)
MCV: 88.9 fL (ref 80.0–100.0)
Platelets: 12 10*3/uL — CL (ref 150–400)
RBC: 2.35 MIL/uL — ABNORMAL LOW (ref 4.22–5.81)
RDW: 19.9 % — ABNORMAL HIGH (ref 11.5–15.5)
WBC: 17.1 10*3/uL — ABNORMAL HIGH (ref 4.0–10.5)
nRBC: 0 % (ref 0.0–0.2)

## 2019-07-07 LAB — RENAL FUNCTION PANEL
Albumin: 2 g/dL — ABNORMAL LOW (ref 3.5–5.0)
Anion gap: 11 (ref 5–15)
BUN: 110 mg/dL — ABNORMAL HIGH (ref 8–23)
CO2: 23 mmol/L (ref 22–32)
Calcium: 7.3 mg/dL — ABNORMAL LOW (ref 8.9–10.3)
Chloride: 102 mmol/L (ref 98–111)
Creatinine, Ser: 3.63 mg/dL — ABNORMAL HIGH (ref 0.61–1.24)
GFR calc Af Amer: 19 mL/min — ABNORMAL LOW (ref >60)
GFR calc non Af Amer: 16 mL/min — ABNORMAL LOW (ref >60)
Glucose, Bld: 106 mg/dL — ABNORMAL HIGH (ref 70–99)
Phosphorus: 2.2 mg/dL — ABNORMAL LOW (ref 2.5–4.6)
Potassium: 3.5 mmol/L (ref 3.5–5.1)
Sodium: 136 mmol/L (ref 135–145)

## 2019-07-07 LAB — MAGNESIUM: Magnesium: 2.1 mg/dL (ref 1.7–2.4)

## 2019-07-07 LAB — PREPARE RBC (CROSSMATCH)

## 2019-07-07 MED ORDER — NEPRO/CARBSTEADY PO LIQD
1000.0000 mL | ORAL | Status: AC
  Administered 2019-07-07 – 2019-07-11 (×3): 1000 mL
  Filled 2019-07-07: qty 1000
  Filled 2019-07-07 (×4): qty 1500
  Filled 2019-07-07: qty 1000

## 2019-07-07 MED ORDER — CHLORHEXIDINE GLUCONATE CLOTH 2 % EX PADS
6.0000 | MEDICATED_PAD | Freq: Every day | CUTANEOUS | Status: DC
  Administered 2019-07-07 – 2019-07-10 (×4): 6 via TOPICAL

## 2019-07-07 MED ORDER — K PHOS MONO-SOD PHOS DI & MONO 155-852-130 MG PO TABS
500.0000 mg | ORAL_TABLET | Freq: Two times a day (BID) | ORAL | Status: AC
  Administered 2019-07-07 (×2): 500 mg
  Filled 2019-07-07 (×2): qty 2

## 2019-07-07 MED ORDER — NEPRO/CARBSTEADY PO LIQD
1000.0000 mL | ORAL | Status: DC
  Filled 2019-07-07: qty 1000

## 2019-07-07 MED ORDER — SODIUM CHLORIDE 0.9% IV SOLUTION: Freq: Once | INTRAVENOUS | Status: AC

## 2019-07-07 NOTE — Progress Notes (Signed)
PROGRESS NOTE  Gregory [name] QIH:[id number] DOB: 14-Dec-1950   PCP: Nicholos Johns, MD  Patient is from: Home.   DOA: 06/11/2019 LOS: 46  Brief Narrative / Interim history: 69-year-old male with history of ESRD on HD MWF, anemia, diastolic CHF, HTN, U-like aneurysm s/p stent grafting repair, known celiac and SMA aneurysm admitted 2/13 with septic shock with severe thrombocytopenia and found to have Pyocystitis with Citrobacter bacteremia.  He had bedside bladder aspiration by urology on 2/16.  He was managed in ICU and transferred to [facility] service on 2/23, only to return to ICU on 2/25 due to hypotension requiring pressors.  He was eventually weaned off IV pressors and remained relatively stable on high-dose midodrine and oral Solu-Cortef, and transferred back to [facility] on 07/07/2019.    Patient was on CRRT from 2/28-3/2 while in ICU.  Patient so seen by hematology for severe thrombocytopenia.  Also followed by palliative care.   Subjective: No major events overnight or this morning.  He feels tired.  Likes to sleep.  He denies chest pain, dyspnea, palpitation, dizziness or GI symptoms.   Objective: Vitals:   07/07/19 1100 07/07/19 1200 07/07/19 1300 07/07/19 1400  BP: (!) 101/56 (!) 109/54 113/60 (!) 106/49  Pulse: 79 80 81 77  Resp: 15 19 18 16   Temp:  98.5 F (36.9 C)    TempSrc:  Oral    SpO2: 100% 100% 100% 100%  Weight:      Height:        Intake/Output Summary (Last 24 hours) at 07/07/2019 1502 Last data filed at 07/07/2019 1400 Gross per 24 hour  Intake 1280 ml  Output 400 ml  Net 880 ml   Filed Weights   07/05/19 0500 07/06/19 0500 07/07/19 0500  Weight: 83.5 kg 84.6 kg 82.4 kg    Examination:  GENERAL: Chronically ill-appearing.  No apparent distress. HEENT: MMM.  Vision and hearing grossly intact.  NECK: Left IJ HD cath RESP:  No IWOB. Good air movement bilaterally. CVS:  RRR. Heart sounds normal.  ABD/GI/GU: Bowel sounds present. Soft. Non tender.  Rectal tube in  place. MSK/EXT:  Moves extremities. No apparent deformity. No edema.  SKIN: no apparent skin lesion or wound NEURO: Awake, alert and oriented appropriately.  No apparent focal neuro deficit but not fully cooperative for neuro exam. PSYCH: Calm. Normal affect.  Procedures:  2/14-left IJ HD cath> 3/4 2/16-bedside bladder aspiration by urology 2/25-Foley catheter> 2/26  Assessment & Plan: Hypotension-unclear etiology.  Echo with normal LVEF and RVSP.  Completed antibiotic course for sepsis and pyocystitis..  Off IV pressors as of 3/3.  Remains on high-dose midodrine and oral Solu-Cortef. -Continue oral midodrine and oral Solu-Cortef.  Normocytic anemia: Hgb 13.0 (admit)>>>> 6.9.  Also with significant thrombocytopenia.  Transfused 1 unit during this hospitalization.  Hematology consulted.  -Transfuse 1 more unit -Anemia panel  Severe acute on chronic thrombocytopenia: Platelets~100 before 2019> 17 (admit)>> 6>> 12.  Unclear etiology.  Hematology consulted earlier in the course.  LDH and haptoglobin unrevealing. -Recommended transfusion for platelets less than 10K or bleeding  A. fib with RVR: Now NSR. 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 Left measures 3.5 cm in diameter, R 3.3 cm.  -Outpatient follow-up  ESRD MWF/BMD: CRRT from 2/15-2/21> regular  IHD on 2/26> hypotension >CRRT on 2/28-3/2> IHD -Left IJ HD cath removed  on 3/4.  Left renal mass -Outpatient follow-up and evaluation  Septic shock due to Pyocystitis and Citrobacter bacteremia-s/p bedside bladder aspiration by urology on 2/16.  Completed antibiotic course and resolved.  However, continues to require high-dose midodrine and oral Solu-Cortef.  Acute metabolic encephalopathy-resolved  Debility/generalized weakness/physical  deconditioning -PT/OT-CIR  Severe protein calorie malnutrition Nutrition Problem: Severe Malnutrition Etiology: chronic illness(ESRD on HD)  Signs/Symptoms: severe muscle depletion, severe fat depletion  Interventions: Tube feeding, Prostat   DVT prophylaxis: SCD in the setting of severe thrombocytopenia Code Status: Full code Family Communication: Patient and/or RN. Available if any question.  Discharge barrier: Very debilitated patient with hypotension, anemia, severe thrombocytopenia Patient is from: Home Final disposition: CIR once stable from anemia and hemodynamic standpoint  Consultants:  Nephrology 2/14 Urology 2/16 Hematology 2/22 Palliative care 2/26   Microbiology summarized: 2/14 respiratory viral panel>> negative 2/14 blood cultures x2>> Citrobacter, sensitivities below 2/15 UCx Citrobacter pansensitive  2/25 BCx2 >>ng  Antibiotics summarized Flagyl 2/14 only Cefepime 2/14 > 2/15 Vancomycin 2/14 > 2/15 Meropenem 2/15 > 2/16 Ceftriaxone 2/16 > 2/22 Cefazolin 2/23 > 2/27  Sch Meds:  Scheduled Meds: . sodium chloride   Intravenous Once  . amiodarone  200 mg Oral BID  . Chlorhexidine Gluconate Cloth  6 each Topical Q0600  . Chlorhexidine Gluconate Cloth  6 each Topical Q0600  . darbepoetin (ARANESP) injection - NON-DIALYSIS  60 mcg Subcutaneous Q Fri-1800  . famotidine  10 mg Oral Daily  . feeding supplement (PRO-STAT SUGAR FREE 64)  60 mL Per Tube TID  . hydrocortisone  10 mg Oral q AM  . hydrocortisone  20 mg Oral QPM  . mouth rinse  15 mL Mouth Rinse BID  . midodrine  20 mg Oral TID WC  . multivitamin  1 tablet Oral QHS  . phosphorus  500 mg Per Tube BID  . sodium chloride flush  10-40 mL Intracatheter Q12H   Continuous Infusions: . sodium chloride Stopped (07/02/19 1258)  . sodium chloride Stopped (06/30/19 1303)  . feeding supplement (NEPRO CARB STEADY)    . feeding supplement (VITAL 1.5 CAL) 55 mL/hr at 07/06/19 1429  . phenylephrine  (NEO-SYNEPHRINE) Adult infusion Stopped (07/06/19 0900)   PRN Meds:.acetaminophen, alteplase, diphenhydrAMINE, heparin, loperamide, ondansetron (ZOFRAN) IV, oxyCODONE, phenol, sodium chloride, sodium chloride flush, traZODone  Antimicrobials: Anti-infectives (From admission, onward)   Start     Dose/Rate Route Frequency Ordered Stop   06/28/19 0930  ceFAZolin (ANCEF) IVPB 1 g/50 mL premix     1 g 100 mL/hr over 30 Minutes Intravenous Every 24 hours 06/28/19 0924 07/02/19 1028   06/21/19 1400  cefTRIAXone (ROCEPHIN) 2 g in sodium chloride 0.9 % 100 mL IVPB  Status:  Discontinued     2 g 200 mL/hr over 30 Minutes Intravenous Every 24 hours 06/21/19 1002 06/28/19 0924   06/20/19 1200  vancomycin (VANCOCIN) IVPB 1000 mg/200 mL premix  Status:  Discontinued     1,000 mg 200 mL/hr over 60 Minutes Intravenous Every M-W-F (Hemodialysis) 06/19/19 0232 06/20/19 1109   06/20/19 1200  meropenem (MERREM) 1 g in sodium chloride 0.9 % 100 mL IVPB  Status:  Discontinued     1 g 200 mL/hr over 30 Minutes Intravenous Every 8 hours 06/20/19 1112 06/21/19 1002   06/19/19 2200  ceFEPIme (MAXIPIME) 1 g in sodium chloride 0.9 % 100 mL IVPB  Status:  Discontinued     1 g 200 mL/hr over 30 Minutes Intravenous Every 24 hours 06/19/19 0232 06/20/19 1109  06/19/19 0230  vancomycin (VANCOREADY) IVPB 750 mg/150 mL     750 mg 150 mL/hr over 60 Minutes Intravenous  Once 06/19/19 0227 06/19/19 0435   06/23/2019 2330  ceFEPIme (MAXIPIME) 2 g in sodium chloride 0.9 % 100 mL IVPB     2 g 200 mL/hr over 30 Minutes Intravenous  Once 06/20/2019 2329 06/19/19 0052   06/22/2019 2330  metroNIDAZOLE (FLAGYL) IVPB 500 mg     500 mg 100 mL/hr over 60 Minutes Intravenous  Once 06/28/2019 2329 06/19/19 0205   06/26/2019 2330  vancomycin (VANCOCIN) IVPB 1000 mg/200 mL premix     1,000 mg 200 mL/hr over 60 Minutes Intravenous  Once 06/26/2019 2329 06/19/19 0205       I have personally reviewed the following labs and images: CBC: Recent  Labs  Lab 07/01/19 0646 07/03/19 0723 07/05/19 0504 07/06/19 0532 07/07/19 0951  WBC 19.5* 6.4 18.2* 14.2* 17.1*  NEUTROABS 17.5* 6.1  --   --   --   HGB 10.4* 8.4* 8.9* 7.3* 6.9*  HCT 29.9* 23.7* 26.4* 21.5* 20.9*  MCV 84.2 84.9 85.7 86.7 88.9  PLT 23* 6* 12* 11* 12*   BMP &GFR Recent Labs  Lab 07/03/19 1839 07/03/19 1839 07/04/19 0647 07/04/19 1643 07/05/19 0504 07/05/19 1815 07/06/19 0532 07/06/19 1551 07/07/19 0951  NA 138  138   < > 139   < > 139 138 137 138 136  K 4.0  3.1*   < > 3.1*   < > 3.1* 3.4* 3.4* 3.5 3.5  CL 105  104   < > 107   < > 105 104 103 103 102  CO2 19*  23   < > 23   < > 26 24 24 25 23   GLUCOSE 124*  149*   < > 97   < > 115* 99 106* 99 106*  BUN 37*  34*   < > 29*   < > 28* 43* 64* 79* 110*  CREATININE 3.03*  2.55*   < > 2.20*   < > 1.51* 1.89* 2.44* 2.94* 3.63*  CALCIUM 6.9*  7.2*   < > 7.3*   < > 7.2* 7.2* 7.1* 7.2* 7.3*  MG 2.0  2.0  --  2.0  --  2.2  --  2.0  --  2.1  PHOS 2.2*  1.8*   < > 1.5*   < > 1.6* 1.5* 1.6* 1.9* 2.2*   < > = values in this interval not displayed.   Estimated Creatinine Clearance: 22.1 mL/min (A) (by C-G formula based on SCr of 3.63 mg/dL (H)). Liver & Pancreas: Recent Labs  Lab 07/01/19 0646 07/01/19 0745 07/03/19 0723 07/03/19 1839 07/05/19 0504 07/05/19 1815 07/06/19 0532 07/06/19 1551 07/07/19 0951  AST 25  --  30  --   --   --   --   --   --   ALT 7  --  5  --   --   --   --   --   --   ALKPHOS 80  --  95  --   --   --   --   --   --   BILITOT 0.9  --  0.7  --   --   --   --   --   --   PROT 4.9*  --  4.2*  --   --   --   --   --   --   ALBUMIN 2.7*   < >  2.3*   < > 2.3* 2.3* 1.9* 2.1* 2.0*   < > = values in this interval not displayed.   No results for input(s): LIPASE, AMYLASE in the last 168 hours. No results for input(s): AMMONIA in the last 168 hours. Diabetic: No results for input(s): HGBA1C in the last 72 hours. Recent Labs  Lab 07/06/19 1944 07/06/19 2334 07/07/19 0329  07/07/19 0804 07/07/19 1203  GLUCAP 110* 114* 104* 102* 94   Cardiac Enzymes: No results for input(s): CKTOTAL, CKMB, CKMBINDEX, TROPONINI in the last 168 hours. No results for input(s): PROBNP in the last 8760 hours. Coagulation Profile: No results for input(s): INR, PROTIME in the last 168 hours. Thyroid Function Tests: No results for input(s): TSH, T4TOTAL, FREET4, T3FREE, THYROIDAB in the last 72 hours. Lipid Profile: No results for input(s): CHOL, HDL, LDLCALC, TRIG, CHOLHDL, LDLDIRECT in the last 72 hours. Anemia Panel: Recent Labs    07/06/19 1142  RETICCTPCT 1.6   Urine analysis:    Component Value Date/Time   COLORURINE YELLOW 12/11/2012 1338   APPEARANCEUR TURBID (A) 12/11/2012 1338   LABSPEC 1.014 12/11/2012 1338   PHURINE 8.0 12/11/2012 1338   GLUCOSEU NEGATIVE 12/11/2012 1338   HGBUR MODERATE (A) 12/11/2012 1338   BILIRUBINUR NEGATIVE 12/11/2012 1338   KETONESUR NEGATIVE 12/11/2012 1338   PROTEINUR >300 (A) 12/11/2012 1338   UROBILINOGEN 0.2 12/11/2012 1338   NITRITE NEGATIVE 12/11/2012 1338   LEUKOCYTESUR LARGE (A) 12/11/2012 1338   Sepsis Labs: Invalid input(s): PROCALCITONIN, [place]  Microbiology: Recent Results (from the past 240 hour(s))  Culture, blood (Routine X 2) w Reflex to ID Panel     Status: None   Collection Time: 06/30/19  2:00 PM   Specimen: BLOOD LEFT HAND  Result Value Ref Range Status   Specimen Description BLOOD LEFT HAND  Final   Special Requests   Final    BOTTLES DRAWN AEROBIC ONLY Blood Culture adequate volume   Culture   Final    NO GROWTH 5 DAYS Performed at [facility], [address], [place], [place] [address]    Report Status 07/05/2019 FINAL  Final  Culture, blood (Routine X 2) w Reflex to ID Panel     Status: None   Collection Time: 06/30/19  2:00 PM   Specimen: BLOOD LEFT HAND  Result Value Ref Range Status   Specimen Description BLOOD LEFT HAND  Final   Special Requests   Final    BOTTLES DRAWN  AEROBIC ONLY Blood Culture results may not be optimal due to an inadequate volume of blood received in culture bottles   Culture   Final    NO GROWTH 5 DAYS Performed at [facility], [place] [address] [place], [place] [address]    Report Status 07/05/2019 FINAL  Final    Radiology Studies: No results found.    Cloyde Oregel T. Bells  If 7PM-7AM, please contact night-coverage [email or web address] Password [email or web address] 07/07/2019, 3:02 PM

## 2019-07-07 NOTE — Progress Notes (Addendum)
Gregory [name]  Assessment/ Plan: Pt is a 69 y.o. yo male already on HD MWF CHF, HTN with Citrobacter sepsis, hypotension and thrombocytopenia.  Dialysis Orders: [place] MWF 3h 75min 400/800 73kg 2/3.5Ca bath P4 AVF Hep 3300 No VDRA/ESA   #ESRD MWF: He was on CRRT from 2/15-2/21 and then regular IHD on 2/26.  Blood pressure remained low therefore transition back to CRRT on 2/28 to 3/2.   -Volume status looks acceptable and potassium level is normal.  Blood pressure is soft today.  He is off of pressor.  We will plan for intermittent hemodialysis tomorrow as per his MWF schedule.  We can remove intermittent dialysis catheter.  # Anemia of CKD:   Monitor hemoglobin. Continue ESA, transfuse as needed.  Platelet count very low.   #Citrobacter sepsis: Seen by urologist and thought to be pyelocystitis status post Foley drainage.  Completed antibiotics.  #Hypotension: Currently on steroid, midodrine and phenylephrine.  Monitor blood pressure.    # Secondary hyperparathyroidism: Low phosphorus.  Problem with IV access therefore continue oral K-Phos.  # Disp: noted palliative care consulted for Gregory [name]. Full code.   Subjective: Seen and examined in ICU.  He is off of pressor.  Blood pressure is soft.  On room air.  No nausea vomiting chest pain or shortness of breath. Objective Vital signs in last 24 hours: Vitals:   07/07/19 0400 07/07/19 0500 07/07/19 0600 07/07/19 0800  BP: (!) 102/42 (!) 107/43 (!) 114/52 (!) 111/45  Pulse: 84 82 84 83  Resp: 17 (!) 32 20 16  Temp: 97.9 F (36.6 C)   98.3 F (36.8 C)  TempSrc: Axillary   Oral  SpO2: 100% 100% 100% 100%  Weight:  82.4 kg    Height:       Weight change: -2.2 kg  Intake/Output Summary (Last 24 hours) at 07/07/2019 0932 Last data filed at 07/07/2019 0800 Gross per 24 hour  Intake 1610 ml  Output 400 ml  Net 1210 ml       Labs: Basic Metabolic Panel: Recent Labs  Lab 07/05/19 1815  07/06/19 0532 07/06/19 1551  NA 138 137 138  K 3.4* 3.4* 3.5  CL 104 103 103  CO2 24 24 25   GLUCOSE 99 106* 99  BUN 43* 64* 79*  CREATININE 1.89* 2.44* 2.94*  CALCIUM 7.2* 7.1* 7.2*  PHOS 1.5* 1.6* 1.9*   Liver Function Tests: Recent Labs  Lab 07/01/19 0646 07/01/19 0745 07/03/19 0723 07/03/19 1839 07/05/19 1815 07/06/19 0532 07/06/19 1551  AST 25  --  30  --   --   --   --   ALT 7  --  5  --   --   --   --   ALKPHOS 80  --  95  --   --   --   --   BILITOT 0.9  --  0.7  --   --   --   --   PROT 4.9*  --  4.2*  --   --   --   --   ALBUMIN 2.7*   < > 2.3*   < > 2.3* 1.9* 2.1*   < > = values in this interval not displayed.   No results for input(s): LIPASE, AMYLASE in the last 168 hours. No results for input(s): AMMONIA in the last 168 hours. CBC: Recent Labs  Lab 07/01/19 0646 07/01/19 0646 07/03/19 0723 07/05/19 0504 07/06/19 0532  WBC 19.5*   < > 6.4  18.2* 14.2*  NEUTROABS 17.5*  --  6.1  --   --   HGB 10.4*   < > 8.4* 8.9* 7.3*  HCT 29.9*   < > 23.7* 26.4* 21.5*  MCV 84.2  --  84.9 85.7 86.7  PLT 23*   < > 6* 12* 11*   < > = values in this interval not displayed.   Cardiac Enzymes: No results for input(s): CKTOTAL, CKMB, CKMBINDEX, TROPONINI in the last 168 hours. CBG: Recent Labs  Lab 07/06/19 1540 07/06/19 1944 07/06/19 2334 07/07/19 0329 07/07/19 0804  GLUCAP 87 110* 114* 104* 102*    Iron Studies: No results for input(s): IRON, TIBC, TRANSFERRIN, FERRITIN in the last 72 hours. Studies/Results: No results found.  Medications: Infusions: . sodium chloride Stopped (07/02/19 1258)  . sodium chloride Stopped (06/30/19 1303)  . feeding supplement (VITAL 1.5 CAL) 55 mL/hr at 07/06/19 1429  . phenylephrine (NEO-SYNEPHRINE) Adult infusion Stopped (07/06/19 0900)    Scheduled Medications: . amiodarone  200 mg Oral BID  . Chlorhexidine Gluconate Cloth  6 each Topical Q0600  . darbepoetin (ARANESP) injection - NON-DIALYSIS  60 mcg Subcutaneous Q  Fri-1800  . famotidine  10 mg Oral Daily  . feeding supplement (PRO-STAT SUGAR FREE 64)  60 mL Per Tube TID  . hydrocortisone  10 mg Oral q AM  . hydrocortisone  20 mg Oral QPM  . mouth rinse  15 mL Mouth Rinse BID  . midodrine  20 mg Oral TID WC  . multivitamin  1 tablet Oral QHS  . sodium chloride flush  10-40 mL Intracatheter Q12H    have reviewed scheduled and prn medications.  Physical Exam: General: Lying on bed comfortable, not in distress. Heart:RRR, s1s2 nl Lungs:clear b/l, no crackle Abdomen:soft, Non-tender, non-distended Extremities: No leg edema. Dialysis Access: Right AVG thrill+, left IJ neck temporary catheter in place.  Gregory [name] Gregory [name] Gregory [name] 07/07/2019,9:32 AM  LOS: 18 days  Pager: [phone]

## 2019-07-07 NOTE — Progress Notes (Signed)
Nutrition Follow-up  DOCUMENTATION CODES:   Severe malnutrition in context of chronic illness  INTERVENTION:   -Nepro @ 50 ml/hr via Cortrak  -60 ml Prostat TID -Renavite daily   Provides: 2640 kcal, 142 grams protein  NUTRITION DIAGNOSIS:   Severe Malnutrition related to chronic illness(ESRD on HD) as evidenced by severe muscle depletion, severe fat depletion. Ongoing.  GOAL:   Patient will meet greater than or equal to 90% of their needs Meeting with TF.   MONITOR:   TF tolerance, PO intake, Supplement acceptance  REASON FOR ASSESSMENT:   Consult Poor PO  ASSESSMENT:   69 yo male admitted with septic shock from fulminant cystitis. PMH includes HTN, CHF, HF, GERD, ESRD on HD, anemia.  Pt continues to eat very poorly CRRT stopped 3/2. Per Renal MD plan to restart iHD now off pressors.   2/15-2/21/21 CRRT  2/23 unable to get cortrak due to platelets of 9 2/24 pt upgraded to DYS3/thins diet 2/28 transition back to CRRT due to hypotension. Pt on quad strength neo due to volume. 3/2 off CRRT   Medications reviewed  Labs reviewed: PO4: 1.9 (L)  CBG's: 110-114-104-102   TF: Vital 1.5 @ 55 ml with 60 ml Prostat TID Provides: 2580 kcal and 179 grams protein  Diet Order:   Diet Order            Diet renal with fluid restriction Fluid restriction: 1200 mL Fluid; Room service appropriate? Yes; Fluid consistency: Thin  Diet effective now              EDUCATION NEEDS:   Not appropriate for education at this time  Skin:  Skin Assessment: Reviewed RN Assessment  Last BM:  400 ml via rectal pouch  Height:   Ht Readings from Last 1 Encounters:  06/19/19 6\' 4"  (1.93 m)    Weight:   Wt Readings from Last 1 Encounters:  07/07/19 82.4 kg    BMI:  Body mass index is 22.11 kg/m.  Estimated Nutritional Needs:   Kcal:  2400-2600  Protein:  140-160 gm  Fluid:  1 L  Sri Clegg P., RD, LDN, CNSC See AMiON for contact information

## 2019-07-07 NOTE — Progress Notes (Signed)
PT Cancellation Note  Patient Details Name: Gregory [name] MRN: [id number] DOB: 31-Jan-1951   Cancelled Treatment:    Reason Eval/Treat Not Completed: Medical issues which prohibited therapy.  Pt's Hgb down to 6.9 and platelets down to 12.  Pt very lethargic and not waking up with stimulation.  Will try back 3/5. 07/07/2019  [name], PT Acute Rehabilitation Services [phone]  (pager) [phone]  (office)   Tessie Fass Jaimin Krupka 07/07/2019, 5:21 PM

## 2019-07-08 LAB — BPAM RBC
Blood Product Expiration Date: 202104032359
ISSUE DATE / TIME: 202103041618
Unit Type and Rh: 5100

## 2019-07-08 LAB — CBC WITH DIFFERENTIAL/PLATELET
Abs Immature Granulocytes: 0.36 10*3/uL — ABNORMAL HIGH (ref 0.00–0.07)
Basophils Absolute: 0 10*3/uL (ref 0.0–0.1)
Basophils Relative: 0 %
Eosinophils Absolute: 0 10*3/uL (ref 0.0–0.5)
Eosinophils Relative: 0 %
HCT: 25.8 % — ABNORMAL LOW (ref 39.0–52.0)
Hemoglobin: 8.9 g/dL — ABNORMAL LOW (ref 13.0–17.0)
Immature Granulocytes: 2 %
Lymphocytes Relative: 2 %
Lymphs Abs: 0.4 10*3/uL — ABNORMAL LOW (ref 0.7–4.0)
MCH: 30.2 pg (ref 26.0–34.0)
MCHC: 34.5 g/dL (ref 30.0–36.0)
MCV: 87.5 fL (ref 80.0–100.0)
Monocytes Absolute: 0.9 10*3/uL (ref 0.1–1.0)
Monocytes Relative: 5 %
Neutro Abs: 17.3 10*3/uL — ABNORMAL HIGH (ref 1.7–7.7)
Neutrophils Relative %: 91 %
Platelets: 19 10*3/uL — CL (ref 150–400)
RBC: 2.95 MIL/uL — ABNORMAL LOW (ref 4.22–5.81)
RDW: 19.1 % — ABNORMAL HIGH (ref 11.5–15.5)
WBC: 19.1 10*3/uL — ABNORMAL HIGH (ref 4.0–10.5)
nRBC: 0 % (ref 0.0–0.2)

## 2019-07-08 LAB — RENAL FUNCTION PANEL
Albumin: 2.2 g/dL — ABNORMAL LOW (ref 3.5–5.0)
Anion gap: 14 (ref 5–15)
BUN: 138 mg/dL — ABNORMAL HIGH (ref 8–23)
CO2: 23 mmol/L (ref 22–32)
Calcium: 7.8 mg/dL — ABNORMAL LOW (ref 8.9–10.3)
Chloride: 102 mmol/L (ref 98–111)
Creatinine, Ser: 4.16 mg/dL — ABNORMAL HIGH (ref 0.61–1.24)
GFR calc Af Amer: 16 mL/min — ABNORMAL LOW (ref >60)
GFR calc non Af Amer: 14 mL/min — ABNORMAL LOW (ref >60)
Glucose, Bld: 125 mg/dL — ABNORMAL HIGH (ref 70–99)
Phosphorus: 2.8 mg/dL (ref 2.5–4.6)
Potassium: 3.5 mmol/L (ref 3.5–5.1)
Sodium: 139 mmol/L (ref 135–145)

## 2019-07-08 LAB — CBC
HCT: 25.7 % — ABNORMAL LOW (ref 39.0–52.0)
Hemoglobin: 8.8 g/dL — ABNORMAL LOW (ref 13.0–17.0)
MCH: 29.7 pg (ref 26.0–34.0)
MCHC: 34.2 g/dL (ref 30.0–36.0)
MCV: 86.8 fL (ref 80.0–100.0)
Platelets: 15 10*3/uL — CL (ref 150–400)
RBC: 2.96 MIL/uL — ABNORMAL LOW (ref 4.22–5.81)
RDW: 19 % — ABNORMAL HIGH (ref 11.5–15.5)
WBC: 18.6 10*3/uL — ABNORMAL HIGH (ref 4.0–10.5)
nRBC: 0 % (ref 0.0–0.2)

## 2019-07-08 LAB — GLUCOSE, CAPILLARY
Glucose-Capillary: 117 mg/dL — ABNORMAL HIGH (ref 70–99)
Glucose-Capillary: 131 mg/dL — ABNORMAL HIGH (ref 70–99)
Glucose-Capillary: 102 mg/dL — ABNORMAL HIGH (ref 70–99)
Glucose-Capillary: 96 mg/dL (ref 70–99)
Glucose-Capillary: 103 mg/dL — ABNORMAL HIGH (ref 70–99)

## 2019-07-08 LAB — TYPE AND SCREEN
ABO/RH(D): O POS
Antibody Screen: NEGATIVE
Unit division: 0

## 2019-07-08 LAB — MAGNESIUM: Magnesium: 2.2 mg/dL (ref 1.7–2.4)

## 2019-07-08 LAB — APTT: aPTT: 24 seconds (ref 24–36)

## 2019-07-08 MED ORDER — ASPIRIN 325 MG PO TABS: 325.0000 mg | ORAL_TABLET | Freq: Every day | ORAL | Status: DC

## 2019-07-08 NOTE — Progress Notes (Signed)
Occupational Therapy Treatment Patient Details Name: Gregory [name] MRN: [id number] DOB: Mar 22, 1951 Today's Date: 07/08/2019    History of present illness 69yo male with 2 days of lethargy and confusion/AMS. CTH negative, negative for PE, covid negative at admission. Admitted for sepsis workup, encephalopathy. Transferred to ICU 2/25 for hypotension requiring pressors. CRRT 2/28-3/2.  PMH ESRD on HD, anemia, CHF, HTN, celiac and mesenteric artery aneurysms, iliac artery aneurysm s/p graft repair.   OT comments  Patient supine in bed, lethargy but awakens with verbal/tactile stimulation.  Patient agreeable to OT/PT session, spouse present in room and supportive.  Patient requires total assist for bed mobility.  Progressing from max assist to min guard with static sitting balance at EOB.  Requires total assist for hand over hand during grooming and functional reaching tasks, with no initiation noted.  Following minimal commands with increased time.  Updated dc plan to SNF.  Will follow acutely.     Follow Up Recommendations  SNF;Supervision/Assistance - 24 hour    Equipment Recommendations  Other (comment)(TBD at next venue of care)    Recommendations for Other Services      Precautions / Restrictions Precautions Precautions: Fall Restrictions Weight Bearing Restrictions: No       Mobility Bed Mobility Overal bed mobility: Needs Assistance Bed Mobility: Supine to Sit;Sit to Supine     Supine to sit: Total assist;+2 for physical assistance Sit to supine: Total assist;+2 for physical assistance   General bed mobility comments: patient requires total assist with no initation of movement to/from EOB   Transfers                 General transfer comment: deferred     Balance Overall balance assessment: Needs assistance Sitting-balance support: No upper extremity supported;Feet supported Sitting balance-Leahy Scale: Poor Sitting balance - Comments: initially requiring max  assist progressing to min guard for static sitting                                    ADL either performed or assessed with clinical judgement   ADL Overall ADL's : Needs assistance/impaired     Grooming: Wash/dry face;Total assistance;Bed level Grooming Details (indicate cue type and reason): hand over hand support (R UE) to wash face, no intation or engagement                   Toilet Transfer Details (indicate cue type and reason): deferred         Functional mobility during ADLs: Total assistance;+2 for physical assistance;+2 for safety/equipment General ADL Comments: pt limited by lethargy, weakness, balance, cognition     Vision       Perception     Praxis      Cognition Arousal/Alertness: Lethargic Behavior During Therapy: Flat affect Overall Cognitive Status: Impaired/Different from baseline Area of Impairment: Attention;Memory;Safety/judgement;Following commands;Awareness;Problem solving                   Current Attention Level: Focused Memory: Decreased recall of precautions;Decreased short-term memory Following Commands: Follows one step commands inconsistently;Follows one step commands with increased time Safety/Judgement: Decreased awareness of safety;Decreased awareness of deficits Awareness: Intellectual Problem Solving: Slow processing;Decreased initiation;Difficulty sequencing;Requires verbal cues;Requires tactile cues General Comments: patient lethargic and opens eyes on sitting EOB only, pt aware he is in the hospital.  Limited verbalizations, but following minimal commands with increased time.  Exercises     Shoulder Instructions       General Comments VSS; spouse present and supportive    Pertinent Vitals/ Pain       Pain Assessment: No/denies pain  Home Living                                          Prior Functioning/Environment              Frequency  Min 2X/week         Progress Toward Goals  OT Goals(current goals can now be found in the care plan section)  Progress towards OT goals: Not progressing toward goals - comment(lethargy)     Plan Frequency remains appropriate;Discharge plan needs to be updated    Co-evaluation    PT/OT/SLP Co-Evaluation/Treatment: Yes Reason for Co-Treatment: Complexity of the patient's impairments (multi-system involvement);Necessary to address cognition/behavior during functional activity;For patient/therapist safety;To address functional/ADL transfers   OT goals addressed during session: ADL's and self-care      AM-PAC OT "6 Clicks" Daily Activity     Outcome Measure   Help from another person eating meals?: Total Help from another person taking care of personal grooming?: Total Help from another person toileting, which includes using toliet, bedpan, or urinal?: Total Help from another person bathing (including washing, rinsing, drying)?: Total Help from another person to put on and taking off regular upper body clothing?: Total Help from another person to put on and taking off regular lower body clothing?: Total 6 Click Score: 6    End of Session    OT Visit Diagnosis: Other abnormalities of gait and mobility (R26.89);Muscle weakness (generalized) (M62.81);Other symptoms and signs involving cognitive function   Activity Tolerance Patient limited by lethargy;Patient limited by fatigue   Patient Left in bed;with call bell/phone within reach;with bed alarm set;with family/visitor present   Nurse Communication Mobility status        Time: 1337-1401 OT Time Calculation (min): 24 min  Charges: OT General Charges $OT Visit: 1 Visit OT Treatments $Self Care/Home Management : 8-22 mins  Jolaine Artist, OT [place] Pager [phone] Office [place] 07/08/2019, 3:00 PM

## 2019-07-08 NOTE — Progress Notes (Signed)
PROGRESS NOTE  RUEL DIMMICK PPJ:[id number] DOB: [birth year]   PCP: Nicholos Johns, MD  Patient is from: Home.   DOA: 07/03/2019 LOS: 39  Brief Narrative / Interim history: 69-year-old male with history of ESRD on HD MWF, anemia, diastolic CHF, HTN, U-like aneurysm s/p stent grafting repair, known celiac and SMA aneurysm admitted 2/13 with septic shock with severe thrombocytopenia and found to have Pyocystitis with Citrobacter bacteremia.  He had bedside bladder aspiration by urology on 2/16.  He was managed in ICU and transferred to [facility] service on 2/23, only to return to ICU on 2/25 due to hypotension requiring pressors.  He was eventually weaned off IV pressors and remained relatively stable on high-dose midodrine and oral Solu-Cortef, and transferred back to [facility] on 07/07/2019.    Patient was on CRRT from 2/28-3/2 while in ICU.  Patient so seen by hematology for severe thrombocytopenia.  Also followed by palliative care.   Subjective: No major events overnight or this morning.  She is on HD this morning.  Sleepy and does not feel like talking.  He denies pain.  Objective: Vitals:   07/08/19 0930 07/08/19 1000 07/08/19 1029 07/08/19 1037  BP: (!) 107/44 (!) 86/34 (!) 99/36 (!) 99/45  Pulse: 82 76 78 83  Resp:      Temp:      TempSrc:      SpO2:      Weight:      Height:        Intake/Output Summary (Last 24 hours) at 07/08/2019 1118 Last data filed at 07/07/2019 2340 Gross per 24 hour  Intake 667.5 ml  Output 250 ml  Net 417.5 ml   Filed Weights   07/07/19 0500 07/08/19 0423 07/08/19 0737  Weight: 82.4 kg 98.4 kg 97.1 kg    Examination:  GENERAL: Chronically ill-appearing.  No apparent distress. HEENT: MMM.  Vision and hearing grossly intact.  NECK: Supple.  No apparent JVD.  RESP:  No IWOB. Good air movement bilaterally. CVS:  RRR. Heart sounds normal.  ABD/GI/GU: Bowel sounds present. Soft. Non tender.  Rectal tube in place. MSK/EXT:  No apparent deformity. No edema.   SKIN: no apparent skin lesion or wound NEURO: Sleepy but wakes to voice and responds to some questions.  No apparent focal neuro deficit but not fully engaging. PSYCH: Calm.  Flat affect  Procedures:  2/14-left IJ HD cath> 3/4 2/16-bedside bladder aspiration by urology 2/25-Foley catheter> 2/26  Assessment & Plan: Hypotension-unclear etiology.  Echo with normal LVEF and RVSP.  Completed antibiotic course for sepsis and pyocystitis..  Off IV pressors as of 3/3.  Improved but remains on high-dose midodrine and oral Solu-Cortef. -Continue oral midodrine and oral Solu-Cortef.  Normocytic anemia: Hgb 13.0 (admit)>>1u>> 6.9>1u> 8.8.  Also with significant thrombocytopenia.  Evaluated by hematology earlier in the course.  LDH and haptoglobin unrevealing -Anemia panel in the morning -Continue monitoring -IV iron and ESA per nephrology  Severe acute on chronic thrombocytopenia: Platelets~100 before 2019> 17 (admit)>> 6>> 12>15.  Unclear etiology.  Hematology consulted earlier in the course.  LDH and haptoglobin unrevealing. -Recommended transfusion for platelets less than 10K or bleeding  A. fib with RVR: Now NSR. 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 Left measures 3.5 cm in diameter, R 3.3 cm.  -Outpatient follow-up  ESRD MWF/BMD: CRRT from 2/15-2/21> regular  IHD  on 2/26> hypotension >CRRT on 2/28-3/2> IHD -Left IJ HD cath removed on 3/4. -IHD on schedule per nephrology  Left renal mass -Outpatient follow-up and evaluation  Septic shock due to Pyocystitis and Citrobacter bacteremia-s/p bedside bladder aspiration by urology on 2/16.  Completed antibiotic course and resolved.  However, continues to require high-dose midodrine and oral Solu-Cortef.  Leukocytosis with bandemia-demargination from steroid?  No  fever. -Continue monitoring  Acute metabolic encephalopathy-resolved  Debility/generalized weakness/physical deconditioning -PT/OT-CIR  Goal of care-very debilitated patient with significant comorbidities as above.  Still full code with full scope of care. -Palliative care following.  Severe protein calorie malnutrition Nutrition Problem: Severe Malnutrition Etiology: chronic illness(ESRD on HD)  Signs/Symptoms: severe muscle depletion, severe fat depletion  Interventions: Tube feeding, Prostat   DVT prophylaxis: SCD in the setting of severe thrombocytopenia Code Status: Full code Family Communication: Patient and/or RN. Available if any question.  Discharge barrier: Safe disposition.  Therapy recommended CIR. Patient is from: Home Final disposition: CIR bed  Consultants:  Nephrology 2/14 Urology 2/16 Hematology 2/22 Palliative care 2/26   Microbiology summarized: 2/14 respiratory viral panel>> negative 2/14 blood cultures x2>> Citrobacter, sensitivities below 2/15 UCx Citrobacter pansensitive  2/25 BCx2 >>ng  Antibiotics summarized Flagyl 2/14 only Cefepime 2/14 > 2/15 Vancomycin 2/14 > 2/15 Meropenem 2/15 > 2/16 Ceftriaxone 2/16 > 2/22 Cefazolin 2/23 > 2/27  Sch Meds:  Scheduled Meds: . amiodarone  200 mg Oral BID  . Chlorhexidine Gluconate Cloth  6 each Topical Q0600  . Chlorhexidine Gluconate Cloth  6 each Topical Q0600  . darbepoetin (ARANESP) injection - NON-DIALYSIS  60 mcg Subcutaneous Q Fri-1800  . famotidine  10 mg Oral Daily  . feeding supplement (PRO-STAT SUGAR FREE 64)  60 mL Per Tube TID  . hydrocortisone  10 mg Oral q AM  . hydrocortisone  20 mg Oral QPM  . mouth rinse  15 mL Mouth Rinse BID  . midodrine  20 mg Oral TID WC  . multivitamin  1 tablet Oral QHS  . sodium chloride flush  10-40 mL Intracatheter Q12H   Continuous Infusions: . sodium chloride Stopped (07/02/19 1258)  . sodium chloride Stopped (06/30/19 1303)  . feeding  supplement (NEPRO CARB STEADY) Stopped (07/08/19 0656)  . phenylephrine (NEO-SYNEPHRINE) Adult infusion Stopped (07/06/19 0900)   PRN Meds:.acetaminophen, alteplase, diphenhydrAMINE, heparin, loperamide, ondansetron (ZOFRAN) IV, oxyCODONE, phenol, sodium chloride, sodium chloride flush, traZODone  Antimicrobials: Anti-infectives (From admission, onward)   Start     Dose/Rate Route Frequency Ordered Stop   06/28/19 0930  ceFAZolin (ANCEF) IVPB 1 g/50 mL premix     1 g 100 mL/hr over 30 Minutes Intravenous Every 24 hours 06/28/19 0924 07/02/19 1028   06/21/19 1400  cefTRIAXone (ROCEPHIN) 2 g in sodium chloride 0.9 % 100 mL IVPB  Status:  Discontinued     2 g 200 mL/hr over 30 Minutes Intravenous Every 24 hours 06/21/19 1002 06/28/19 0924   06/20/19 1200  vancomycin (VANCOCIN) IVPB 1000 mg/200 mL premix  Status:  Discontinued     1,000 mg 200 mL/hr over 60 Minutes Intravenous Every M-W-F (Hemodialysis) 06/19/19 0232 06/20/19 1109   06/20/19 1200  meropenem (MERREM) 1 g in sodium chloride 0.9 % 100 mL IVPB  Status:  Discontinued     1 g 200 mL/hr over 30 Minutes Intravenous Every 8 hours 06/20/19 1112 06/21/19 1002   06/19/19 2200  ceFEPIme (MAXIPIME) 1 g in sodium chloride 0.9 % 100 mL IVPB  Status:  Discontinued  1 g 200 mL/hr over 30 Minutes Intravenous Every 24 hours 06/19/19 0232 06/20/19 1109   06/19/19 0230  vancomycin (VANCOREADY) IVPB 750 mg/150 mL     750 mg 150 mL/hr over 60 Minutes Intravenous  Once 06/19/19 0227 06/19/19 0435   06/10/2019 2330  ceFEPIme (MAXIPIME) 2 g in sodium chloride 0.9 % 100 mL IVPB     2 g 200 mL/hr over 30 Minutes Intravenous  Once 06/13/2019 2329 06/19/19 0052   06/28/2019 2330  metroNIDAZOLE (FLAGYL) IVPB 500 mg     500 mg 100 mL/hr over 60 Minutes Intravenous  Once 06/12/2019 2329 06/19/19 0205   06/15/2019 2330  vancomycin (VANCOCIN) IVPB 1000 mg/200 mL premix     1,000 mg 200 mL/hr over 60 Minutes Intravenous  Once 06/10/2019 2329 06/19/19 0205        I have personally reviewed the following labs and images: CBC: Recent Labs  Lab 07/03/19 0723 07/05/19 0504 07/06/19 0532 07/07/19 0951 07/08/19 0325  WBC 6.4 18.2* 14.2* 17.1* 19.1*  18.6*  NEUTROABS 6.1  --   --   --  17.3*  HGB 8.4* 8.9* 7.3* 6.9* 8.9*  8.8*  HCT 23.7* 26.4* 21.5* 20.9* 25.8*  25.7*  MCV 84.9 85.7 86.7 88.9 87.5  86.8  PLT 6* 12* 11* 12* 19*  15*   BMP &GFR Recent Labs  Lab 07/04/19 0647 07/04/19 1643 07/05/19 0504 07/05/19 0504 07/05/19 1815 07/06/19 0532 07/06/19 1551 07/07/19 0951 07/08/19 0325  NA 139   < > 139   < > 138 137 138 136 139  K 3.1*   < > 3.1*   < > 3.4* 3.4* 3.5 3.5 3.5  CL 107   < > 105   < > 104 103 103 102 102  CO2 23   < > 26   < > 24 24 25 23 23   GLUCOSE 97   < > 115*   < > 99 106* 99 106* 125*  BUN 29*   < > 28*   < > 43* 64* 79* 110* 138*  CREATININE 2.20*   < > 1.51*   < > 1.89* 2.44* 2.94* 3.63* 4.16*  CALCIUM 7.3*   < > 7.2*   < > 7.2* 7.1* 7.2* 7.3* 7.8*  MG 2.0  --  2.2  --   --  2.0  --  2.1 2.2  PHOS 1.5*   < > 1.6*   < > 1.5* 1.6* 1.9* 2.2* 2.8   < > = values in this interval not displayed.   Estimated Creatinine Clearance: 20.3 mL/min (A) (by C-G formula based on SCr of 4.16 mg/dL (H)). Liver & Pancreas: Recent Labs  Lab 07/03/19 0723 07/03/19 1839 07/05/19 1815 07/06/19 0532 07/06/19 1551 07/07/19 0951 07/08/19 0325  AST 30  --   --   --   --   --   --   ALT 5  --   --   --   --   --   --   ALKPHOS 95  --   --   --   --   --   --   BILITOT 0.7  --   --   --   --   --   --   PROT 4.2*  --   --   --   --   --   --   ALBUMIN 2.3*   < > 2.3* 1.9* 2.1* 2.0* 2.2*   < > = values in this interval  not displayed.   No results for input(s): LIPASE, AMYLASE in the last 168 hours. No results for input(s): AMMONIA in the last 168 hours. Diabetic: No results for input(s): HGBA1C in the last 72 hours. Recent Labs  Lab 07/07/19 1546 07/07/19 1925 07/07/19 2313 07/08/19 0353 07/08/19 0452  GLUCAP 105* 123*  135* 117* 131*   Cardiac Enzymes: No results for input(s): CKTOTAL, CKMB, CKMBINDEX, TROPONINI in the last 168 hours. No results for input(s): PROBNP in the last 8760 hours. Coagulation Profile: No results for input(s): INR, PROTIME in the last 168 hours. Thyroid Function Tests: No results for input(s): TSH, T4TOTAL, FREET4, T3FREE, THYROIDAB in the last 72 hours. Lipid Profile: No results for input(s): CHOL, HDL, LDLCALC, TRIG, CHOLHDL, LDLDIRECT in the last 72 hours. Anemia Panel: Recent Labs    07/06/19 1142  RETICCTPCT 1.6   Urine analysis:    Component Value Date/Time   COLORURINE YELLOW 12/11/2012 1338   APPEARANCEUR TURBID (A) 12/11/2012 1338   LABSPEC 1.014 12/11/2012 1338   PHURINE 8.0 12/11/2012 1338   GLUCOSEU NEGATIVE 12/11/2012 1338   HGBUR MODERATE (A) 12/11/2012 1338   BILIRUBINUR NEGATIVE 12/11/2012 1338   KETONESUR NEGATIVE 12/11/2012 1338   PROTEINUR >300 (A) 12/11/2012 1338   UROBILINOGEN 0.2 12/11/2012 1338   NITRITE NEGATIVE 12/11/2012 1338   LEUKOCYTESUR LARGE (A) 12/11/2012 1338   Sepsis Labs: Invalid input(s): PROCALCITONIN, [place]  Microbiology: Recent Results (from the past 240 hour(s))  Culture, blood (Routine X 2) w Reflex to ID Panel     Status: None   Collection Time: 06/30/19  2:00 PM   Specimen: BLOOD LEFT HAND  Result Value Ref Range Status   Specimen Description BLOOD LEFT HAND  Final   Special Requests   Final    BOTTLES DRAWN AEROBIC ONLY Blood Culture adequate volume   Culture   Final    NO GROWTH 5 DAYS Performed at [facility], [address], [place], [place] [address]    Report Status 07/05/2019 FINAL  Final  Culture, blood (Routine X 2) w Reflex to ID Panel     Status: None   Collection Time: 06/30/19  2:00 PM   Specimen: BLOOD LEFT HAND  Result Value Ref Range Status   Specimen Description BLOOD LEFT HAND  Final   Special Requests   Final    BOTTLES DRAWN AEROBIC ONLY Blood Culture results may not be  optimal due to an inadequate volume of blood received in culture bottles   Culture   Final    NO GROWTH 5 DAYS Performed at [facility], [place] [address] [place], [place] [address]    Report Status 07/05/2019 FINAL  Final    Radiology Studies: No results found.    Dorlisa Savino T. [place]  If 7PM-7AM, please contact night-coverage [email or web address] Password [email or web address] 07/08/2019, 11:18 AM

## 2019-07-08 NOTE — Progress Notes (Signed)
Patient transferred to [phone] with belongings. Patient stable. 6N RN at bedside and NT.

## 2019-07-08 NOTE — Progress Notes (Signed)
Inpatient Rehabilitation Admissions Coordinator  I continue to recommend SNF level rehab . He will need prolonged recovery and not yet at a level to tolerate the intensity of an inpt rehab admit.  Danne Baxter, RN, MSN Rehab Admissions Coordinator [phone] 07/08/2019 3:08 PM

## 2019-07-08 NOTE — Progress Notes (Signed)
[facility] NEPHROLOGY PROGRESS NOTE  Assessment/ Plan: Pt is a 69 y.o. yo male already on HD MWF CHF, HTN with Citrobacter sepsis, hypotension and thrombocytopenia.  Dialysis Orders: [place] MWF 3h 1min 400/800 73kg 2/3.5Ca bath P4 AVF Hep 3300 No VDRA/ESA   #ESRD MWF: He was on CRRT from 2/15-2/21 and then regular IHD on 2/26.  Blood pressure remained low therefore transition back to CRRT on 2/28 to 3/2.   -Doing intermittent hemodialysis today and tolerating well so far.  UF goal only 0.5 kg as his volume status acceptable.  The temporary HD catheter was removed.  # Anemia of CKD:   Monitor hemoglobin. Continue ESA, transfuse as needed.  Platelet count very low.   #Citrobacter sepsis: Seen by urologist and thought to be pyelocystitis status post Foley drainage.  Completed antibiotics.  #Hypotension: Not pressure improved.  He is on midodrine.  Not on pressors.  Monitor blood pressure.    # Secondary hyperparathyroidism: Low phosphorus.  Repleted oral K-Phos.  # Disp: noted palliative care consulted for [place]. Full code.   Subjective: Seen and examined in dialysis unit.  He is tolerating dialysis well.  Blood pressure acceptable.  No nausea vomiting chest pain shortness of breath.    Objective Vital signs in last 24 hours: Vitals:   07/08/19 0754 07/08/19 0800 07/08/19 0830 07/08/19 0900  BP: (!) 130/57 (!) 122/53 (!) 124/51 (!) 99/35  Pulse: (!) 122 (!) 123 (!) 125 78  Resp: (!) 21     Temp:      TempSrc:      SpO2:      Weight:      Height:       Weight change: 16 kg  Intake/Output Summary (Last 24 hours) at 07/08/2019 0941 Last data filed at 07/07/2019 2340 Gross per 24 hour  Intake 777.5 ml  Output 250 ml  Net 527.5 ml       Labs: Basic Metabolic Panel: Recent Labs  Lab 07/06/19 1551 07/07/19 0951 07/08/19 0325  NA 138 136 139  K 3.5 3.5 3.5  CL 103 102 102  CO2 25 23 23   GLUCOSE 99 106* 125*  BUN 79* 110* 138*  CREATININE 2.94*  3.63* 4.16*  CALCIUM 7.2* 7.3* 7.8*  PHOS 1.9* 2.2* 2.8   Liver Function Tests: Recent Labs  Lab 07/03/19 0723 07/03/19 1839 07/06/19 1551 07/07/19 0951 07/08/19 0325  AST 30  --   --   --   --   ALT 5  --   --   --   --   ALKPHOS 95  --   --   --   --   BILITOT 0.7  --   --   --   --   PROT 4.2*  --   --   --   --   ALBUMIN 2.3*   < > 2.1* 2.0* 2.2*   < > = values in this interval not displayed.   No results for input(s): LIPASE, AMYLASE in the last 168 hours. No results for input(s): AMMONIA in the last 168 hours. CBC: Recent Labs  Lab 07/03/19 0723 07/03/19 0723 07/05/19 0504 07/05/19 0504 07/06/19 0532 07/07/19 0951 07/08/19 0325  WBC 6.4   < > 18.2*   < > 14.2* 17.1* 19.1*  18.6*  NEUTROABS 6.1  --   --   --   --   --  17.3*  HGB 8.4*   < > 8.9*   < > 7.3* 6.9* 8.9*  8.8*  HCT 23.7*   < > 26.4*   < > 21.5* 20.9* 25.8*  25.7*  MCV 84.9  --  85.7  --  86.7 88.9 87.5  86.8  PLT 6*   < > 12*   < > 11* 12* 19*  15*   < > = values in this interval not displayed.   Cardiac Enzymes: No results for input(s): CKTOTAL, CKMB, CKMBINDEX, TROPONINI in the last 168 hours. CBG: Recent Labs  Lab 07/07/19 1546 07/07/19 1925 07/07/19 2313 07/08/19 0353 07/08/19 0452  GLUCAP 105* 123* 135* 117* 131*    Iron Studies: No results for input(s): IRON, TIBC, TRANSFERRIN, FERRITIN in the last 72 hours. Studies/Results: No results found.  Medications: Infusions: . sodium chloride Stopped (07/02/19 1258)  . sodium chloride Stopped (06/30/19 1303)  . feeding supplement (NEPRO CARB STEADY) Stopped (07/08/19 0656)  . phenylephrine (NEO-SYNEPHRINE) Adult infusion Stopped (07/06/19 0900)    Scheduled Medications: . amiodarone  200 mg Oral BID  . Chlorhexidine Gluconate Cloth  6 each Topical Q0600  . Chlorhexidine Gluconate Cloth  6 each Topical Q0600  . darbepoetin (ARANESP) injection - NON-DIALYSIS  60 mcg Subcutaneous Q Fri-1800  . famotidine  10 mg Oral Daily  .  feeding supplement (PRO-STAT SUGAR FREE 64)  60 mL Per Tube TID  . hydrocortisone  10 mg Oral q AM  . hydrocortisone  20 mg Oral QPM  . mouth rinse  15 mL Mouth Rinse BID  . midodrine  20 mg Oral TID WC  . multivitamin  1 tablet Oral QHS  . sodium chloride flush  10-40 mL Intracatheter Q12H    have reviewed scheduled and prn medications.  Physical Exam: General: Not in distress, lying on bed comfortable Heart:RRR, s1s2 nl Lungs:clear b/l, no crackle Abdomen:soft, Non-tender, non-distended Extremities: No leg edema. Dialysis Access: Right AVG thrill+, temporary catheter site was removed and has dressing applied. Nakaila Freeze Prasad Charly Holcomb 07/08/2019,9:41 AM  LOS: 19 days  Pager: [phone]

## 2019-07-08 NOTE — Progress Notes (Signed)
Physical Therapy Treatment Patient Details Name: Gregory [name] MRN: [id number] DOB: [birth year] Today's Date: 07/08/2019    History of Present Illness Pt is a 69 y/o male presenting with two days of lethargy and confusion/AMS. CTH negative, negative for PE, covid negative at admission. Admitted for sepsis workup, encephalopathy. Transferred to ICU 2/25 for hypotension requiring pressors. CRRT 2/28-3/2.  PMH ESRD on HD, anemia, CHF, HTN, celiac and mesenteric artery aneurysms, iliac artery aneurysm s/p graft repair.    PT Comments    Pt presented supine in bed with HOB elevated, initially asleep and difficult to arouse. Pt's spouse present and agreeable to therapy session. Pt greatly limited today secondary to lethargy after having HD this AM. He continues to require heavy physical assistance for bed mobility. Transfers deferred this session secondary to arousal level and pt quickly fatiguing sitting EOB. Pt with reported dizziness upon sitting. BP in sitting was 163/76 mmHg and then reassessed again in supine (124/48 mmHg).   Pt would continue to benefit from skilled physical therapy services at this time while admitted and after d/c to address the below listed limitations in order to improve overall safety and independence with functional mobility.    Follow Up Recommendations  SNF     Equipment Recommendations  Other (comment)(defer to next venue of care)    Recommendations for Other Services       Precautions / Restrictions Precautions Precautions: Fall Precaution Comments: Cortrak, rectal tube Restrictions Weight Bearing Restrictions: No    Mobility  Bed Mobility Overal bed mobility: Needs Assistance Bed Mobility: Supine to Sit;Sit to Supine     Supine to sit: Total assist;+2 for physical assistance Sit to supine: Total assist;+2 for physical assistance   General bed mobility comments: patient requires total assist with no initation of movement to/from EOB   Transfers                  General transfer comment: deferred secondary to arousal level  Ambulation/Gait                 Stairs             Wheelchair Mobility    Modified Rankin (Stroke Patients Only)       Balance Overall balance assessment: Needs assistance Sitting-balance support: No upper extremity supported;Feet supported Sitting balance-Leahy Scale: Poor Sitting balance - Comments: initially requiring max assist progressing to min guard for static sitting                                     Cognition Arousal/Alertness: Lethargic Behavior During Therapy: Flat affect Overall Cognitive Status: Impaired/Different from baseline Area of Impairment: Attention;Memory;Safety/judgement;Following commands;Awareness;Problem solving                   Current Attention Level: Focused Memory: Decreased recall of precautions;Decreased short-term memory Following Commands: Follows one step commands inconsistently;Follows one step commands with increased time Safety/Judgement: Decreased awareness of safety;Decreased awareness of deficits Awareness: Intellectual Problem Solving: Slow processing;Decreased initiation;Difficulty sequencing;Requires verbal cues;Requires tactile cues General Comments: patient lethargic and opens eyes on sitting EOB only, pt aware he is in the hospital.  Limited verbalizations, but following minimal commands with increased time.       Exercises      General Comments General comments (skin integrity, edema, etc.): VSS; spouse present and supportive      Pertinent Vitals/Pain Pain Assessment: Faces Faces Pain  Scale: Hurts little more Pain Location: with bilateral UE PROM Pain Descriptors / Indicators: Moaning Pain Intervention(s): Monitored during session;Repositioned    Home Living                      Prior Function            PT Goals (current goals can now be found in the care plan section) Acute  Rehab PT Goals PT Goal Formulation: With patient/family Time For Goal Achievement: 07/12/19 Potential to Achieve Goals: Fair Progress towards PT goals: Progressing toward goals    Frequency    Min 2X/week      PT Plan Discharge plan needs to be updated;Frequency needs to be updated    Co-evaluation PT/OT/SLP Co-Evaluation/Treatment: Yes Reason for Co-Treatment: Complexity of the patient's impairments (multi-system involvement);For patient/therapist safety;To address functional/ADL transfers PT goals addressed during session: Mobility/safety with mobility;Balance;Strengthening/ROM OT goals addressed during session: ADL's and self-care      AM-PAC PT "6 Clicks" Mobility   Outcome Measure  Help needed turning from your back to your side while in a flat bed without using bedrails?: Total Help needed moving from lying on your back to sitting on the side of a flat bed without using bedrails?: Total Help needed moving to and from a bed to a chair (including a wheelchair)?: Total Help needed standing up from a chair using your arms (e.g., wheelchair or bedside chair)?: Total Help needed to walk in hospital room?: Total Help needed climbing 3-5 steps with a railing? : Total 6 Click Score: 6    End of Session   Activity Tolerance: Patient limited by lethargy Patient left: in bed;with call bell/phone within reach;with bed alarm set;with family/visitor present Nurse Communication: Mobility status PT Visit Diagnosis: Muscle weakness (generalized) (M62.81);Other abnormalities of gait and mobility (R26.89);Difficulty in walking, not elsewhere classified (R26.2)     Time: 9675-9163 PT Time Calculation (min) (ACUTE ONLY): 24 min  Charges:  $Therapeutic Activity: 8-22 mins                     Anastasio Champion, DPT  Acute Rehabilitation Services Pager [phone] Office [place] 07/08/2019, 4:51 PM

## 2019-07-08 NOTE — Progress Notes (Signed)
PT Cancellation Note  Patient Details Name: Gregory [name] MRN: [id number] DOB: 1951-03-08   Cancelled Treatment:    Reason Eval/Treat Not Completed: Patient at procedure or test/unavailable. Pt at HD. Will continue to follow acutely as available.    Alicia 07/08/2019, 10:34 AM

## 2019-07-09 LAB — GLUCOSE, CAPILLARY
Glucose-Capillary: 106 mg/dL — ABNORMAL HIGH (ref 70–99)
Glucose-Capillary: 107 mg/dL — ABNORMAL HIGH (ref 70–99)
Glucose-Capillary: 108 mg/dL — ABNORMAL HIGH (ref 70–99)
Glucose-Capillary: 114 mg/dL — ABNORMAL HIGH (ref 70–99)
Glucose-Capillary: 92 mg/dL (ref 70–99)
Glucose-Capillary: 93 mg/dL (ref 70–99)

## 2019-07-09 LAB — RENAL FUNCTION PANEL
Albumin: 2 g/dL — ABNORMAL LOW (ref 3.5–5.0)
Anion gap: 14 (ref 5–15)
BUN: 80 mg/dL — ABNORMAL HIGH (ref 8–23)
CO2: 23 mmol/L (ref 22–32)
Calcium: 7.6 mg/dL — ABNORMAL LOW (ref 8.9–10.3)
Chloride: 99 mmol/L (ref 98–111)
Creatinine, Ser: 3.06 mg/dL — ABNORMAL HIGH (ref 0.61–1.24)
GFR calc Af Amer: 23 mL/min — ABNORMAL LOW (ref >60)
GFR calc non Af Amer: 20 mL/min — ABNORMAL LOW (ref >60)
Glucose, Bld: 105 mg/dL — ABNORMAL HIGH (ref 70–99)
Phosphorus: 3 mg/dL (ref 2.5–4.6)
Potassium: 3.8 mmol/L (ref 3.5–5.1)
Sodium: 136 mmol/L (ref 135–145)

## 2019-07-09 LAB — IRON AND TIBC
Iron: 27 ug/dL — ABNORMAL LOW (ref 45–182)
Saturation Ratios: 12 % — ABNORMAL LOW (ref 17.9–39.5)
TIBC: 230 ug/dL — ABNORMAL LOW (ref 250–450)
UIBC: 203 ug/dL

## 2019-07-09 LAB — CBC
HCT: 25.3 % — ABNORMAL LOW (ref 39.0–52.0)
Hemoglobin: 8.5 g/dL — ABNORMAL LOW (ref 13.0–17.0)
MCH: 29.8 pg (ref 26.0–34.0)
MCHC: 33.6 g/dL (ref 30.0–36.0)
MCV: 88.8 fL (ref 80.0–100.0)
Platelets: 17 10*3/uL — CL (ref 150–400)
RBC: 2.85 MIL/uL — ABNORMAL LOW (ref 4.22–5.81)
RDW: 19.9 % — ABNORMAL HIGH (ref 11.5–15.5)
WBC: 13.5 10*3/uL — ABNORMAL HIGH (ref 4.0–10.5)
nRBC: 0 % (ref 0.0–0.2)

## 2019-07-09 LAB — MAGNESIUM: Magnesium: 1.9 mg/dL (ref 1.7–2.4)

## 2019-07-09 LAB — APTT: aPTT: 36 seconds (ref 24–36)

## 2019-07-09 LAB — FERRITIN: Ferritin: 255 ng/mL (ref 24–336)

## 2019-07-09 NOTE — Progress Notes (Signed)
PROGRESS NOTE  Gregory [name] WVP:[id number] DOB: [birth year]   PCP: Nicholos Johns, MD  Patient is from: Home.   DOA: 06/19/2019 LOS: 36  Brief Narrative / Interim history: 69-year-old male with history of ESRD on HD MWF, anemia, diastolic CHF, HTN, U-like aneurysm s/p stent grafting repair, known celiac and SMA aneurysm admitted 2/13 with septic shock with severe thrombocytopenia and found to have Pyocystitis with Citrobacter bacteremia.  He had bedside bladder aspiration by urology on 2/16.  He was managed in ICU and transferred to [facility] service on 2/23, only to return to ICU on 2/25 due to hypotension requiring pressors.  He was eventually weaned off IV pressors and remained relatively stable on high-dose midodrine and oral Solu-Cortef, and transferred back to [facility] on 07/07/2019.    Patient was on CRRT from 2/28-3/2 while in ICU.  Patient so seen by hematology for severe thrombocytopenia.  Also followed by palliative care.   PT/OT recommended CIR. However, CIR recommended SNF as patient needs prolonged recovery, and not yet at the level to tolerate intensity at CIR  Subjective: No major events overnight or this morning.  No complaints.  He denies pain, dyspnea, GI or UTI symptoms.  Objective: Vitals:   07/08/19 1249 07/08/19 1800 07/08/19 2301 07/09/19 0506  BP: (!) 116/42 (!) 115/58 (!) 121/48 (!) 121/44  Pulse: 88 80 86 87  Resp: 18 18 17 17   Temp: 98 F (36.7 C) 97.7 F (36.5 C) 98.6 F (37 C) 98.4 F (36.9 C)  TempSrc: Axillary Axillary Oral Oral  SpO2: 100% 98% 96% 99%  Weight:      Height:        Intake/Output Summary (Last 24 hours) at 07/09/2019 1257 Last data filed at 07/09/2019 0900 Gross per 24 hour  Intake 20 ml  Output 1 ml  Net 19 ml   Filed Weights   07/08/19 0423 07/08/19 0737 07/08/19 1159  Weight: 98.4 kg 97.1 kg 98 kg    Examination:  GENERAL: Chronically ill-appearing.  No apparent distress. HEENT: MMM.  Vision and hearing grossly intact.  NECK: Supple.   No apparent JVD.  RESP:  No IWOB. Good air movement bilaterally. CVS:  RRR. Heart sounds normal.  ABD/GI/GU: Bowel sounds present. Soft. Non tender.  Rectal tube in place. MSK/EXT:  Moves extremities. No apparent deformity. No edema.  SKIN: no apparent skin lesion or wound NEURO: Awake, alert and oriented appropriately.  Fair symmetric strength bilaterally PSYCH: Calm. Normal affect.  Procedures:  2/14-left IJ HD cath> 3/4 2/16-bedside bladder aspiration by urology 2/25-Foley catheter> 2/26  Assessment & Plan: Hypotension-unclear etiology.  Echo with normal LVEF and RVSP.  Completed antibiotic course for sepsis and pyocystitis..  Off IV pressors as of 3/3.  Remains on high-dose midodrine and oral Solu-Cortef. -Continue oral midodrine and oral Solu-Cortef.  Normocytic anemia: Hgb 13.0 (admit)>>1u>> 6.9>1u> 8.8> 8.5.  Iron sat 12%.  Also with significant thrombocytopenia.  Evaluated by hematology earlier in the course.  LDH and haptoglobin unrevealing.  -Continue monitoring -IV iron and ESA per nephrology  Severe acute on chronic thrombocytopenia: Platelets~100 before 2019> 17 (admit)>> 6>> 12>17.  Unclear etiology.  Hematology consulted earlier in the course.  LDH and haptoglobin unrevealing. -Recommended transfusion for platelets less than 10K or bleeding  A. fib with RVR: Now NSR. 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 Left measures  3.5 cm in diameter, R 3.3 cm.  -Outpatient follow-up  ESRD MWF/BMD: CRRT from 2/15-2/21> regular  IHD on 2/26> hypotension >CRRT on 2/28-3/2> IHD -Left IJ HD cath removed on 3/4. -IHD MWF per nephrology.  Left renal mass -Outpatient follow-up and evaluation  Septic shock due to Pyocystitis and Citrobacter bacteremia-s/p bedside bladder aspiration by urology on 2/16.   Completed antibiotic course and resolved.  However, continues to require high-dose midodrine and oral Solu-Cortef.  Leukocytosis with bandemia-demargination from steroid?  No fever. -Continue monitoring  Acute metabolic encephalopathy-resolved  Debility/generalized weakness/physical deconditioning -PT/OT  Goal of care-very debilitated patient with significant comorbidities as above.  Still full code with full scope of care. -Palliative care following.  Severe protein calorie malnutrition Nutrition Problem: Severe Malnutrition Etiology: chronic illness(ESRD on HD)  Signs/Symptoms: severe muscle depletion, severe fat depletion  Interventions: Tube feeding, Prostat   DVT prophylaxis: SCD in the setting of severe thrombocytopenia Code Status: Full code Family Communication: Patient and/or RN. Available if any question.  Discharge barrier: Safe disposition which would be SNF at this time.  Does not qualify for CIR. Patient is from: Home Final disposition: SNF bed  Consultants:  Nephrology 2/14 Urology 2/16 Hematology 2/22 Palliative care 2/26   Microbiology summarized: 2/14 respiratory viral panel>> negative 2/14 blood cultures x2>> Citrobacter, sensitivities below 2/15 UCx Citrobacter pansensitive  2/25 BCx2 >>ng  Antibiotics summarized Flagyl 2/14 only Cefepime 2/14 > 2/15 Vancomycin 2/14 > 2/15 Meropenem 2/15 > 2/16 Ceftriaxone 2/16 > 2/22 Cefazolin 2/23 > 2/27  Sch Meds:  Scheduled Meds: . amiodarone  200 mg Oral BID  . Chlorhexidine Gluconate Cloth  6 each Topical Q0600  . Chlorhexidine Gluconate Cloth  6 each Topical Q0600  . darbepoetin (ARANESP) injection - NON-DIALYSIS  60 mcg Subcutaneous Q Fri-1800  . famotidine  10 mg Oral Daily  . feeding supplement (PRO-STAT SUGAR FREE 64)  60 mL Per Tube TID  . hydrocortisone  10 mg Oral q AM  . hydrocortisone  20 mg Oral QPM  . mouth rinse  15 mL Mouth Rinse BID  . midodrine  20 mg Oral TID WC  .  multivitamin  1 tablet Oral QHS  . sodium chloride flush  10-40 mL Intracatheter Q12H   Continuous Infusions: . sodium chloride Stopped (07/02/19 1258)  . sodium chloride Stopped (06/30/19 1303)  . feeding supplement (NEPRO CARB STEADY) 1,000 mL (07/08/19 1841)  . phenylephrine (NEO-SYNEPHRINE) Adult infusion Stopped (07/06/19 0900)   PRN Meds:.acetaminophen, alteplase, diphenhydrAMINE, loperamide, ondansetron (ZOFRAN) IV, oxyCODONE, phenol, sodium chloride, sodium chloride flush, traZODone  Antimicrobials: Anti-infectives (From admission, onward)   Start     Dose/Rate Route Frequency Ordered Stop   06/28/19 0930  ceFAZolin (ANCEF) IVPB 1 g/50 mL premix     1 g 100 mL/hr over 30 Minutes Intravenous Every 24 hours 06/28/19 0924 07/02/19 1028   06/21/19 1400  cefTRIAXone (ROCEPHIN) 2 g in sodium chloride 0.9 % 100 mL IVPB  Status:  Discontinued     2 g 200 mL/hr over 30 Minutes Intravenous Every 24 hours 06/21/19 1002 06/28/19 0924   06/20/19 1200  vancomycin (VANCOCIN) IVPB 1000 mg/200 mL premix  Status:  Discontinued     1,000 mg 200 mL/hr over 60 Minutes Intravenous Every M-W-F (Hemodialysis) 06/19/19 0232 06/20/19 1109   06/20/19 1200  meropenem (MERREM) 1 g in sodium chloride 0.9 % 100 mL IVPB  Status:  Discontinued     1 g 200 mL/hr over 30 Minutes Intravenous Every 8 hours 06/20/19 1112  06/21/19 1002   06/19/19 2200  ceFEPIme (MAXIPIME) 1 g in sodium chloride 0.9 % 100 mL IVPB  Status:  Discontinued     1 g 200 mL/hr over 30 Minutes Intravenous Every 24 hours 06/19/19 0232 06/20/19 1109   06/19/19 0230  vancomycin (VANCOREADY) IVPB 750 mg/150 mL     750 mg 150 mL/hr over 60 Minutes Intravenous  Once 06/19/19 0227 06/19/19 0435   06/11/2019 2330  ceFEPIme (MAXIPIME) 2 g in sodium chloride 0.9 % 100 mL IVPB     2 g 200 mL/hr over 30 Minutes Intravenous  Once 06/29/2019 2329 06/19/19 0052   07/02/2019 2330  metroNIDAZOLE (FLAGYL) IVPB 500 mg     500 mg 100 mL/hr over 60 Minutes  Intravenous  Once 06/17/2019 2329 06/19/19 0205   06/19/2019 2330  vancomycin (VANCOCIN) IVPB 1000 mg/200 mL premix     1,000 mg 200 mL/hr over 60 Minutes Intravenous  Once 06/17/2019 2329 06/19/19 0205       I have personally reviewed the following labs and images: CBC: Recent Labs  Lab 07/03/19 0723 07/03/19 0723 07/05/19 0504 07/06/19 0532 07/07/19 0951 07/08/19 0325 07/09/19 0748  WBC 6.4   < > 18.2* 14.2* 17.1* 19.1*  18.6* 13.5*  NEUTROABS 6.1  --   --   --   --  17.3*  --   HGB 8.4*   < > 8.9* 7.3* 6.9* 8.9*  8.8* 8.5*  HCT 23.7*   < > 26.4* 21.5* 20.9* 25.8*  25.7* 25.3*  MCV 84.9   < > 85.7 86.7 88.9 87.5  86.8 88.8  PLT 6*   < > 12* 11* 12* 19*  15* 17*   < > = values in this interval not displayed.   BMP &GFR Recent Labs  Lab 07/05/19 0504 07/05/19 1815 07/06/19 0532 07/06/19 1551 07/07/19 0951 07/08/19 0325 07/09/19 0708  NA 139   < > 137 138 136 139 136  K 3.1*   < > 3.4* 3.5 3.5 3.5 3.8  CL 105   < > 103 103 102 102 99  CO2 26   < > 24 25 23 23 23   GLUCOSE 115*   < > 106* 99 106* 125* 105*  BUN 28*   < > 64* 79* 110* 138* 80*  CREATININE 1.51*   < > 2.44* 2.94* 3.63* 4.16* 3.06*  CALCIUM 7.2*   < > 7.1* 7.2* 7.3* 7.8* 7.6*  MG 2.2  --  2.0  --  2.1 2.2 1.9  PHOS 1.6*   < > 1.6* 1.9* 2.2* 2.8 3.0   < > = values in this interval not displayed.   Estimated Creatinine Clearance: 27.6 mL/min (A) (by C-G formula based on SCr of 3.06 mg/dL (H)). Liver & Pancreas: Recent Labs  Lab 07/03/19 0723 07/03/19 1839 07/06/19 0532 07/06/19 1551 07/07/19 0951 07/08/19 0325 07/09/19 0708  AST 30  --   --   --   --   --   --   ALT 5  --   --   --   --   --   --   ALKPHOS 95  --   --   --   --   --   --   BILITOT 0.7  --   --   --   --   --   --   PROT 4.2*  --   --   --   --   --   --   ALBUMIN 2.3*   < >  1.9* 2.1* 2.0* 2.2* 2.0*   < > = values in this interval not displayed.   No results for input(s): LIPASE, AMYLASE in the last 168 hours. No results for  input(s): AMMONIA in the last 168 hours. Diabetic: No results for input(s): HGBA1C in the last 72 hours. Recent Labs  Lab 07/08/19 2025 07/09/19 0009 07/09/19 0504 07/09/19 0740 07/09/19 1228  GLUCAP 103* 108* 92 93 114*   Cardiac Enzymes: No results for input(s): CKTOTAL, CKMB, CKMBINDEX, TROPONINI in the last 168 hours. No results for input(s): PROBNP in the last 8760 hours. Coagulation Profile: No results for input(s): INR, PROTIME in the last 168 hours. Thyroid Function Tests: No results for input(s): TSH, T4TOTAL, FREET4, T3FREE, THYROIDAB in the last 72 hours. Lipid Profile: No results for input(s): CHOL, HDL, LDLCALC, TRIG, CHOLHDL, LDLDIRECT in the last 72 hours. Anemia Panel: Recent Labs    07/09/19 1024  FERRITIN 255  TIBC 230*  IRON 27*   Urine analysis:    Component Value Date/Time   COLORURINE YELLOW 12/11/2012 1338   APPEARANCEUR TURBID (A) 12/11/2012 1338   LABSPEC 1.014 12/11/2012 1338   PHURINE 8.0 12/11/2012 1338   GLUCOSEU NEGATIVE 12/11/2012 1338   HGBUR MODERATE (A) 12/11/2012 1338   BILIRUBINUR NEGATIVE 12/11/2012 1338   KETONESUR NEGATIVE 12/11/2012 1338   PROTEINUR >300 (A) 12/11/2012 1338   UROBILINOGEN 0.2 12/11/2012 1338   NITRITE NEGATIVE 12/11/2012 1338   LEUKOCYTESUR LARGE (A) 12/11/2012 1338   Sepsis Labs: Invalid input(s): PROCALCITONIN, [place]  Microbiology: Recent Results (from the past 240 hour(s))  Culture, blood (Routine X 2) w Reflex to ID Panel     Status: None   Collection Time: 06/30/19  2:00 PM   Specimen: BLOOD LEFT HAND  Result Value Ref Range Status   Specimen Description BLOOD LEFT HAND  Final   Special Requests   Final    BOTTLES DRAWN AEROBIC ONLY Blood Culture adequate volume   Culture   Final    NO GROWTH 5 DAYS Performed at [facility], [address] [place], [place] [address]    Report Status 07/05/2019 FINAL  Final  Culture, blood (Routine X 2) w Reflex to ID Panel     Status: None    Collection Time: 06/30/19  2:00 PM   Specimen: BLOOD LEFT HAND  Result Value Ref Range Status   Specimen Description BLOOD LEFT HAND  Final   Special Requests   Final    BOTTLES DRAWN AEROBIC ONLY Blood Culture results may not be optimal due to an inadequate volume of blood received in culture bottles   Culture   Final    NO GROWTH 5 DAYS Performed at [facility], [place] [address] [place], [place] [address]    Report Status 07/05/2019 FINAL  Final    Radiology Studies: No results found.    Makyi Ledo T. [place]  If 7PM-7AM, please contact night-coverage [email or web address] Password [facility] 07/09/2019, 12:57 PM

## 2019-07-09 NOTE — Progress Notes (Signed)
Gregory [name]  Assessment/ Plan: Pt is a 69 y.o. yo male already on HD MWF CHF, HTN with Citrobacter sepsis, hypotension and thrombocytopenia.  Dialysis Orders: [place] MWF 3h 28min 400/800 73kg 2/3.5Ca bath P4 AVF Hep 3300 No VDRA/ESA   #ESRD MWF: He was on CRRT from 2/15-2/21 and then regular IHD on 2/26.  BP remained low therefore transition back to CRRT on 2/28 to 3/2.   -He had intermittent HD yesterday, tolerated well.  No issue with access.  Plan for next HD on 3/8.  # Anemia of CKD:   Monitor hemoglobin. Continue ESA, transfuse as needed.  Platelet count very low.  I will check iron studies.  #Citrobacter sepsis: Seen by urologist and thought to be pyelocystitis status post Foley drainage.  Completed antibiotics.  #Hypotension: BP improved.  On midodrine.  Monitor blood pressure.    # Secondary hyperparathyroidism: Required phosphorus repletion in the beginning.  Monitor lab.  # Disp: noted palliative care consulted for Gregory [name]. Full code.   Subjective: Seen and examined.  No new event.  Denies nausea vomiting chest pain shortness of breath.  More alert today.  Objective Vital signs in last 24 hours: Vitals:   07/08/19 1249 07/08/19 1800 07/08/19 2301 07/09/19 0506  BP: (!) 116/42 (!) 115/58 (!) 121/48 (!) 121/44  Pulse: 88 80 86 87  Resp: 18 18 17 17   Temp: 98 F (36.7 C) 97.7 F (36.5 C) 98.6 F (37 C) 98.4 F (36.9 C)  TempSrc: Axillary Axillary Oral Oral  SpO2: 100% 98% 96% 99%  Weight:      Height:       Weight change: -1.361 kg  Intake/Output Summary (Last 24 hours) at 07/09/2019 0928 Last data filed at 07/09/2019 0509 Gross per 24 hour  Intake 20 ml  Output -659 ml  Net 679 ml       Labs: Basic Metabolic Panel: Recent Labs  Lab 07/07/19 0951 07/08/19 0325 07/09/19 0708  NA 136 139 136  K 3.5 3.5 3.8  CL 102 102 99  CO2 23 23 23   GLUCOSE 106* 125* 105*  BUN 110* 138* 80*  CREATININE 3.63* 4.16* 3.06*   CALCIUM 7.3* 7.8* 7.6*  PHOS 2.2* 2.8 3.0   Liver Function Tests: Recent Labs  Lab 07/03/19 0723 07/03/19 1839 07/07/19 0951 07/08/19 0325 07/09/19 0708  AST 30  --   --   --   --   ALT 5  --   --   --   --   ALKPHOS 95  --   --   --   --   BILITOT 0.7  --   --   --   --   PROT 4.2*  --   --   --   --   ALBUMIN 2.3*   < > 2.0* 2.2* 2.0*   < > = values in this interval not displayed.   No results for input(s): LIPASE, AMYLASE in the last 168 hours. No results for input(s): AMMONIA in the last 168 hours. CBC: Recent Labs  Lab 07/03/19 0723 07/03/19 0723 07/05/19 0504 07/05/19 0504 07/06/19 0532 07/06/19 0532 07/07/19 0951 07/08/19 0325 07/09/19 0748  WBC 6.4   < > 18.2*   < > 14.2*   < > 17.1* 19.1*  18.6* 13.5*  NEUTROABS 6.1  --   --   --   --   --   --  17.3*  --   HGB 8.4*   < > 8.9*   < >  7.3*   < > 6.9* 8.9*  8.8* 8.5*  HCT 23.7*   < > 26.4*   < > 21.5*   < > 20.9* 25.8*  25.7* 25.3*  MCV 84.9   < > 85.7  --  86.7  --  88.9 87.5  86.8 88.8  PLT 6*   < > 12*   < > 11*   < > 12* 19*  15* 17*   < > = values in this interval not displayed.   Cardiac Enzymes: No results for input(s): CKTOTAL, CKMB, CKMBINDEX, TROPONINI in the last 168 hours. CBG: Recent Labs  Lab 07/08/19 1721 07/08/19 2025 07/09/19 0009 07/09/19 0504 07/09/19 0740  GLUCAP 96 103* 108* 92 93    Iron Studies: No results for input(s): IRON, TIBC, TRANSFERRIN, FERRITIN in the last 72 hours. Studies/Results: No results found.  Medications: Infusions: . sodium chloride Stopped (07/02/19 1258)  . sodium chloride Stopped (06/30/19 1303)  . feeding supplement (NEPRO CARB STEADY) 1,000 mL (07/08/19 1841)  . phenylephrine (NEO-SYNEPHRINE) Adult infusion Stopped (07/06/19 0900)    Scheduled Medications: . amiodarone  200 mg Oral BID  . Chlorhexidine Gluconate Cloth  6 each Topical Q0600  . Chlorhexidine Gluconate Cloth  6 each Topical Q0600  . darbepoetin (ARANESP) injection -  NON-DIALYSIS  60 mcg Subcutaneous Q Fri-1800  . famotidine  10 mg Oral Daily  . feeding supplement (PRO-STAT SUGAR FREE 64)  60 mL Per Tube TID  . hydrocortisone  10 mg Oral q AM  . hydrocortisone  20 mg Oral QPM  . mouth rinse  15 mL Mouth Rinse BID  . midodrine  20 mg Oral TID WC  . multivitamin  1 tablet Oral QHS  . sodium chloride flush  10-40 mL Intracatheter Q12H    have reviewed scheduled and prn medications.  Physical Exam: General: Able to lie on bed comfortable, not in distress, has a nasal feeding tube. Heart:RRR, s1s2 nl Lungs:clear b/l, no crackle Abdomen:soft, Non-tender, non-distended Extremities: No leg edema. Dialysis Access: Right AVG thrill+, temporary catheter site was removed and has dressing applied. Rajah Tagliaferro Tanna Furry 07/09/2019,9:28 AM  LOS: 20 days  Pager: [phone]

## 2019-07-10 LAB — GLUCOSE, CAPILLARY
Glucose-Capillary: 102 mg/dL — ABNORMAL HIGH (ref 70–99)
Glucose-Capillary: 108 mg/dL — ABNORMAL HIGH (ref 70–99)
Glucose-Capillary: 114 mg/dL — ABNORMAL HIGH (ref 70–99)
Glucose-Capillary: 132 mg/dL — ABNORMAL HIGH (ref 70–99)
Glucose-Capillary: 141 mg/dL — ABNORMAL HIGH (ref 70–99)
Glucose-Capillary: 145 mg/dL — ABNORMAL HIGH (ref 70–99)
Glucose-Capillary: 99 mg/dL (ref 70–99)

## 2019-07-10 MED ORDER — SODIUM CHLORIDE 0.9 % IV SOLN
125.0000 mg | INTRAVENOUS | Status: DC
  Administered 2019-07-11 – 2019-07-15 (×3): 125 mg via INTRAVENOUS
  Filled 2019-07-10 (×3): qty 10

## 2019-07-10 MED ORDER — CHLORHEXIDINE GLUCONATE CLOTH 2 % EX PADS
6.0000 | MEDICATED_PAD | Freq: Every day | CUTANEOUS | Status: DC
  Administered 2019-07-10 – 2019-07-11 (×2): 6 via TOPICAL

## 2019-07-10 NOTE — Progress Notes (Signed)
**Gregory Gregory** Gregory Gregory  Assessment/ Plan: Pt is a 69 y.o. yo male already on HD MWF CHF, HTN with Citrobacter sepsis, hypotension and thrombocytopenia.  Dialysis Orders: [place] MWF 3h 88min 400/800 73kg 2/3.5Ca bath P4 AVF Hep 3300 No VDRA/ESA   #ESRD MWF: He was on CRRT from 2/15-2/21 and then regular IHD on 2/26.  BP remained low therefore transition back to CRRT on 2/28 to 3/2.   -He had intermittent HD on 3/5 and tolerated well.  Plan for next HD tomorrow.  # Anemia of CKD:   Monitor hemoglobin. Continue ESA, transfuse as needed.  Platelet count very low.  Iron saturation 12% only.  Since he is not on antibiotics anymore I will start IV iron with HD.  #Citrobacter sepsis: Seen by urologist and thought to be pyelocystitis status post Foley drainage.  Completed antibiotics.  #Hypotension: BP improved.  On midodrine.  Monitor blood pressure.    # Secondary hyperparathyroidism: Required phosphorus repletion in the beginning.  Monitor lab.  # Disp: noted palliative care consulted for [place]. Full code.   Subjective: Seen and examined.  No new event.  Denies nausea vomiting chest pain shortness of breath.  Objective Vital signs in last 24 hours: Vitals:   07/09/19 1316 07/09/19 2029 07/10/19 0421 07/10/19 0500  BP: (!) 106/52 (!) 95/51 (!) 97/51   Pulse: 93 95 88   Resp: 14 20 20    Temp: 97.8 F (36.6 C) 98.2 F (36.8 C) 98.1 F (36.7 C)   TempSrc: Oral  Oral   SpO2: 100% 99% 96%   Weight:    100.2 kg  Height:       Weight change: 3.175 kg  Intake/Output Summary (Last 24 hours) at 07/10/2019 1123 Last data filed at 07/09/2019 1700 Gross per 24 hour  Intake 0 ml  Output 0 ml  Net 0 ml       Labs: Basic Metabolic Panel: Recent Labs  Lab 07/07/19 0951 07/08/19 0325 07/09/19 0708  NA 136 139 136  K 3.5 3.5 3.8  CL 102 102 99  CO2 23 23 23   GLUCOSE 106* 125* 105*  BUN 110* 138* 80*  CREATININE 3.63* 4.16* 3.06*  CALCIUM 7.3*  7.8* 7.6*  PHOS 2.2* 2.8 3.0   Liver Function Tests: Recent Labs  Lab 07/07/19 0951 07/08/19 0325 07/09/19 0708  ALBUMIN 2.0* 2.2* 2.0*   No results for input(s): LIPASE, AMYLASE in the last 168 hours. No results for input(s): AMMONIA in the last 168 hours. CBC: Recent Labs  Lab 07/05/19 0504 07/05/19 0504 07/06/19 0532 07/06/19 0532 07/07/19 0951 07/08/19 0325 07/09/19 0748  WBC 18.2*   < > 14.2*   < > 17.1* 19.1*  18.6* 13.5*  NEUTROABS  --   --   --   --   --  17.3*  --   HGB 8.9*   < > 7.3*   < > 6.9* 8.9*  8.8* 8.5*  HCT 26.4*   < > 21.5*   < > 20.9* 25.8*  25.7* 25.3*  MCV 85.7  --  86.7  --  88.9 87.5  86.8 88.8  PLT 12*   < > 11*   < > 12* 19*  15* 17*   < > = values in this interval not displayed.   Cardiac Enzymes: No results for input(s): CKTOTAL, CKMB, CKMBINDEX, TROPONINI in the last 168 hours. CBG: Recent Labs  Lab 07/09/19 1609 07/09/19 2025 07/10/19 0008 07/10/19 0418 07/10/19 0732  GLUCAP 106* 107*  99 108* 102*    Iron Studies:  Recent Labs    07/09/19 1024  IRON 27*  TIBC 230*  FERRITIN 255   Studies/Results: No results found.  Medications: Infusions: . sodium chloride Stopped (07/02/19 1258)  . sodium chloride Stopped (06/30/19 1303)  . feeding supplement (NEPRO CARB STEADY) 1,000 mL (07/08/19 1841)  . phenylephrine (NEO-SYNEPHRINE) Adult infusion Stopped (07/06/19 0900)    Scheduled Medications: . amiodarone  200 mg Oral BID  . Chlorhexidine Gluconate Cloth  6 each Topical Q0600  . Chlorhexidine Gluconate Cloth  6 each Topical Q0600  . darbepoetin (ARANESP) injection - NON-DIALYSIS  60 mcg Subcutaneous Q Fri-1800  . famotidine  10 mg Oral Daily  . feeding supplement (PRO-STAT SUGAR FREE 64)  60 mL Per Tube TID  . hydrocortisone  10 mg Oral q AM  . hydrocortisone  20 mg Oral QPM  . mouth rinse  15 mL Mouth Rinse BID  . midodrine  20 mg Oral TID WC  . multivitamin  1 tablet Oral QHS  . sodium chloride flush  10-40 mL  Intracatheter Q12H    have reviewed scheduled and prn medications.  Physical Exam: General: Able to lie on bed comfortable, NAD, has a nasal feeding tube. Heart:RRR, s1s2 nl Lungs:clear b/l, no crackle Abdomen:soft, Non-tender, non-distended Extremities: No leg edema. Dialysis Access: Right AVG thrill+, temporary catheter site was removed and has dressing applied. Gregory Gregory 07/10/2019,11:23 AM  LOS: 21 days  Pager: [phone]

## 2019-07-10 NOTE — Progress Notes (Signed)
PROGRESS NOTE  Gregory [name]:[id number] DOB: April 11, 1951   PCP: Nicholos Johns, MD  Patient is from: Home.   DOA: 06/10/2019 LOS: 21  Brief Narrative / Interim history: 69-year-old male with history of ESRD on HD MWF, anemia, diastolic CHF, HTN, U-like aneurysm s/p stent grafting repair, known celiac and SMA aneurysm admitted 2/13 with septic shock with severe thrombocytopenia and found to have Pyocystitis with Citrobacter bacteremia.  He had bedside bladder aspiration by urology on 2/16.  He was managed in ICU and transferred to [facility] service on 2/23, only to return to ICU on 2/25 due to hypotension requiring pressors.  He was eventually weaned off IV pressors and remained relatively stable on high-dose midodrine and oral Solu-Cortef, and transferred back to [facility] on 07/07/2019.    Patient was on CRRT from 2/28-3/2 while in ICU.  Patient so seen by hematology for severe thrombocytopenia.  Also followed by palliative care.   PT/OT recommended CIR. However, CIR recommended SNF as patient needs prolonged recovery, and not yet at the level to tolerate intensity at CIR  Subjective: No major events overnight or this morning.  No complaint this morning.  He denies pain, dyspnea, GI or UTI symptoms.  Objective: Vitals:   07/09/19 2029 07/10/19 0421 07/10/19 0500 07/10/19 1311  BP: (!) 95/51 (!) 97/51  (!) 109/55  Pulse: 95 88  93  Resp: 20 20  15   Temp: 98.2 F (36.8 C) 98.1 F (36.7 C)  98.2 F (36.8 C)  TempSrc:  Oral  Oral  SpO2: 99% 96%  96%  Weight:   100.2 kg   Height:        Intake/Output Summary (Last 24 hours) at 07/10/2019 1431 Last data filed at 07/09/2019 1700 Gross per 24 hour  Intake --  Output 0 ml  Net 0 ml   Filed Weights   07/08/19 0737 07/08/19 1159 07/10/19 0500  Weight: 97.1 kg 98 kg 100.2 kg    Examination:  GENERAL: Chronically ill-appearing.  No apparent distress. HEENT: MMM.  Cortrak in place NECK: Supple.  No apparent JVD.  RESP:  No IWOB.  Fair aeration  bilaterally. CVS:  RRR. Heart sounds normal.  ABD/GI/GU: Bowel sounds present. Soft. Non tender.  Rectal tube in place. MSK/EXT:  Moves extremities. No apparent deformity. No edema.  SKIN: no apparent skin lesion or wound NEURO: Sleepy but wakes to voice easily.  Oriented appropriately.  No apparent focal neuro deficit. PSYCH: Calm. Normal affect.  Procedures:  2/14-left IJ HD cath> 3/4 2/16-bedside bladder aspiration by urology 2/25-Foley catheter> 2/26  Assessment & Plan: Hypotension-unclear etiology.  Echo with normal LVEF and RVSP.  Completed antibiotic course for sepsis and pyocystitis..  Off IV pressors as of 3/3.  Soft blood pressures despite high-dose midodrine and oral Solu-Cortef. -Continue oral midodrine and oral Solu-Cortef.  Normocytic anemia: Hgb 13.0 (admit)>>1u>> 6.9>1u> 8.8> 8.5.  Iron sat 12%.  Also with significant thrombocytopenia.  Evaluated by hematology earlier in the course.  LDH and haptoglobin unrevealing.  -Continue monitoring -IV iron and ESA per nephrology  Severe acute on chronic thrombocytopenia: Platelets~100 before 2019> 17 (admit)>> 6>> 12>17.  Unclear etiology.  Hematology consulted earlier in the course.  LDH and haptoglobin unrevealing. -Recommended transfusion for platelets less than 10K or bleeding  A. fib with RVR: Now NSR. -On amiodarone 200 mg twice daily 2/26>>May reduce to 200 mg daily after 3 weeks. 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 Left measures 3.5 cm in diameter, R 3.3 cm.  -Outpatient follow-up  ESRD MWF/BMD: CRRT from 2/15-2/21> regular  IHD on 2/26> hypotension >CRRT on 2/28-3/2> IHD -Left IJ HD cath removed on 3/4. -IHD MWF per nephrology.  Left renal mass -Outpatient follow-up and evaluation  Septic shock due to Pyocystitis and Citrobacter bacteremia-s/p bedside bladder  aspiration by urology on 2/16.  Completed antibiotic course and resolved.  However, continues to require high-dose midodrine and oral Solu-Cortef.  Leukocytosis with bandemia-demargination from steroid?  No fever. -Continue monitoring  Acute metabolic encephalopathy-resolved  Debility/generalized weakness/physical deconditioning -PT/OT  Goal of care-very debilitated patient with significant comorbidities as above.  Still full code with full scope of care. -Palliative care following.  Severe protein calorie malnutrition Nutrition Problem: Severe Malnutrition Etiology: chronic illness(ESRD on HD)  Signs/Symptoms: severe muscle depletion, severe fat depletion  Interventions: Tube feeding, Prostat   DVT prophylaxis: SCD in the setting of severe thrombocytopenia Code Status: Full code Family Communication: Patient and/or RN.  Attempted to call patient's wife for update but no answer on both home and cell phone.  Discharge barrier: Safe disposition which would be SNF at this time.  Does not qualify for CIR. Patient is from: Home Final disposition: SNF bed  Consultants:  Nephrology 2/14 Urology 2/16 Hematology 2/22 Palliative care 2/26   Microbiology summarized: 2/14 respiratory viral panel>> negative 2/14 blood cultures x2>> Citrobacter, sensitivities below 2/15 UCx Citrobacter pansensitive  2/25 BCx2 >>ng  Antibiotics summarized Flagyl 2/14 only Cefepime 2/14 > 2/15 Vancomycin 2/14 > 2/15 Meropenem 2/15 > 2/16 Ceftriaxone 2/16 > 2/22 Cefazolin 2/23 > 2/27  Sch Meds:  Scheduled Meds: . amiodarone  200 mg Oral BID  . Chlorhexidine Gluconate Cloth  6 each Topical Q0600  . Chlorhexidine Gluconate Cloth  6 each Topical Q0600  . Chlorhexidine Gluconate Cloth  6 each Topical Q0600  . darbepoetin (ARANESP) injection - NON-DIALYSIS  60 mcg Subcutaneous Q Fri-1800  . famotidine  10 mg Oral Daily  . feeding supplement (PRO-STAT SUGAR FREE 64)  60 mL Per Tube TID  .  hydrocortisone  10 mg Oral q AM  . hydrocortisone  20 mg Oral QPM  . mouth rinse  15 mL Mouth Rinse BID  . midodrine  20 mg Oral TID WC  . multivitamin  1 tablet Oral QHS  . sodium chloride flush  10-40 mL Intracatheter Q12H   Continuous Infusions: . sodium chloride Stopped (07/02/19 1258)  . sodium chloride Stopped (06/30/19 1303)  . feeding supplement (NEPRO CARB STEADY) 1,000 mL (07/08/19 1841)  . [START ON 07/11/2019] ferric gluconate (FERRLECIT/NULECIT) IV    . phenylephrine (NEO-SYNEPHRINE) Adult infusion Stopped (07/06/19 0900)   PRN Meds:.acetaminophen, alteplase, diphenhydrAMINE, loperamide, ondansetron (ZOFRAN) IV, oxyCODONE, phenol, sodium chloride, sodium chloride flush, traZODone  Antimicrobials: Anti-infectives (From admission, onward)   Start     Dose/Rate Route Frequency Ordered Stop   06/28/19 0930  ceFAZolin (ANCEF) IVPB 1 g/50 mL premix     1 g 100 mL/hr over 30 Minutes Intravenous Every 24 hours 06/28/19 0924 07/02/19 1028   06/21/19 1400  cefTRIAXone (ROCEPHIN) 2 g in sodium chloride 0.9 % 100 mL IVPB  Status:  Discontinued     2 g 200 mL/hr over 30 Minutes Intravenous Every 24 hours 06/21/19 1002 06/28/19 0924   06/20/19 1200  vancomycin (VANCOCIN) IVPB 1000 mg/200 mL premix  Status:  Discontinued     1,000 mg 200 mL/hr over 60 Minutes Intravenous Every  M-W-F (Hemodialysis) 06/19/19 0232 06/20/19 1109   06/20/19 1200  meropenem (MERREM) 1 g in sodium chloride 0.9 % 100 mL IVPB  Status:  Discontinued     1 g 200 mL/hr over 30 Minutes Intravenous Every 8 hours 06/20/19 1112 06/21/19 1002   06/19/19 2200  ceFEPIme (MAXIPIME) 1 g in sodium chloride 0.9 % 100 mL IVPB  Status:  Discontinued     1 g 200 mL/hr over 30 Minutes Intravenous Every 24 hours 06/19/19 0232 06/20/19 1109   06/19/19 0230  vancomycin (VANCOREADY) IVPB 750 mg/150 mL     750 mg 150 mL/hr over 60 Minutes Intravenous  Once 06/19/19 0227 06/19/19 0435   06/26/2019 2330  ceFEPIme (MAXIPIME) 2 g in  sodium chloride 0.9 % 100 mL IVPB     2 g 200 mL/hr over 30 Minutes Intravenous  Once 06/12/2019 2329 06/19/19 0052   06/19/2019 2330  metroNIDAZOLE (FLAGYL) IVPB 500 mg     500 mg 100 mL/hr over 60 Minutes Intravenous  Once 06/07/2019 2329 06/19/19 0205   06/16/2019 2330  vancomycin (VANCOCIN) IVPB 1000 mg/200 mL premix     1,000 mg 200 mL/hr over 60 Minutes Intravenous  Once 06/26/2019 2329 06/19/19 0205       I have personally reviewed the following labs and images: CBC: Recent Labs  Lab 07/05/19 0504 07/06/19 0532 07/07/19 0951 07/08/19 0325 07/09/19 0748  WBC 18.2* 14.2* 17.1* 19.1*  18.6* 13.5*  NEUTROABS  --   --   --  17.3*  --   HGB 8.9* 7.3* 6.9* 8.9*  8.8* 8.5*  HCT 26.4* 21.5* 20.9* 25.8*  25.7* 25.3*  MCV 85.7 86.7 88.9 87.5  86.8 88.8  PLT 12* 11* 12* 19*  15* 17*   BMP &GFR Recent Labs  Lab 07/05/19 0504 07/05/19 1815 07/06/19 0532 07/06/19 1551 07/07/19 0951 07/08/19 0325 07/09/19 0708  NA 139   < > 137 138 136 139 136  K 3.1*   < > 3.4* 3.5 3.5 3.5 3.8  CL 105   < > 103 103 102 102 99  CO2 26   < > 24 25 23 23 23   GLUCOSE 115*   < > 106* 99 106* 125* 105*  BUN 28*   < > 64* 79* 110* 138* 80*  CREATININE 1.51*   < > 2.44* 2.94* 3.63* 4.16* 3.06*  CALCIUM 7.2*   < > 7.1* 7.2* 7.3* 7.8* 7.6*  MG 2.2  --  2.0  --  2.1 2.2 1.9  PHOS 1.6*   < > 1.6* 1.9* 2.2* 2.8 3.0   < > = values in this interval not displayed.   Estimated Creatinine Clearance: 27.6 mL/min (A) (by C-G formula based on SCr of 3.06 mg/dL (H)). Liver & Pancreas: Recent Labs  Lab 07/06/19 0532 07/06/19 1551 07/07/19 0951 07/08/19 0325 07/09/19 0708  ALBUMIN 1.9* 2.1* 2.0* 2.2* 2.0*   No results for input(s): LIPASE, AMYLASE in the last 168 hours. No results for input(s): AMMONIA in the last 168 hours. Diabetic: No results for input(s): HGBA1C in the last 72 hours. Recent Labs  Lab 07/09/19 2025 07/10/19 0008 07/10/19 0418 07/10/19 0732 07/10/19 1228  GLUCAP 107* 99 108*  102* 114*   Cardiac Enzymes: No results for input(s): CKTOTAL, CKMB, CKMBINDEX, TROPONINI in the last 168 hours. No results for input(s): PROBNP in the last 8760 hours. Coagulation Profile: No results for input(s): INR, PROTIME in the last 168 hours. Thyroid Function Tests: No results for input(s): TSH, T4TOTAL,  FREET4, T3FREE, THYROIDAB in the last 72 hours. Lipid Profile: No results for input(s): CHOL, HDL, LDLCALC, TRIG, CHOLHDL, LDLDIRECT in the last 72 hours. Anemia Panel: Recent Labs    07/09/19 1024  FERRITIN 255  TIBC 230*  IRON 27*   Urine analysis:    Component Value Date/Time   COLORURINE YELLOW 12/11/2012 1338   APPEARANCEUR TURBID (A) 12/11/2012 1338   LABSPEC 1.014 12/11/2012 1338   PHURINE 8.0 12/11/2012 1338   GLUCOSEU NEGATIVE 12/11/2012 1338   HGBUR MODERATE (A) 12/11/2012 1338   BILIRUBINUR NEGATIVE 12/11/2012 1338   KETONESUR NEGATIVE 12/11/2012 1338   PROTEINUR >300 (A) 12/11/2012 1338   UROBILINOGEN 0.2 12/11/2012 1338   NITRITE NEGATIVE 12/11/2012 1338   LEUKOCYTESUR LARGE (A) 12/11/2012 1338   Sepsis Labs: Invalid input(s): PROCALCITONIN, [place]  Microbiology: No results found for this or any previous visit (from the past 240 hour(s)).  Radiology Studies: No results found.    Auri Jahnke T. [place]  If 7PM-7AM, please contact night-coverage [email or web address] Password [facility] 07/10/2019, 2:31 PM

## 2019-07-11 ENCOUNTER — Inpatient Hospital Stay (HOSPITAL_COMMUNITY): Payer: Medicare Other

## 2019-07-11 DIAGNOSIS — R5383 Other fatigue: Secondary | ICD-10-CM

## 2019-07-11 DIAGNOSIS — D72825 Bandemia: Secondary | ICD-10-CM

## 2019-07-11 LAB — RENAL FUNCTION PANEL
Albumin: 2 g/dL — ABNORMAL LOW (ref 3.5–5.0)
Anion gap: 16 — ABNORMAL HIGH (ref 5–15)
BUN: 145 mg/dL — ABNORMAL HIGH (ref 8–23)
CO2: 22 mmol/L (ref 22–32)
Calcium: 7.9 mg/dL — ABNORMAL LOW (ref 8.9–10.3)
Chloride: 96 mmol/L — ABNORMAL LOW (ref 98–111)
Creatinine, Ser: 4.82 mg/dL — ABNORMAL HIGH (ref 0.61–1.24)
GFR calc Af Amer: 13 mL/min — ABNORMAL LOW (ref >60)
GFR calc non Af Amer: 11 mL/min — ABNORMAL LOW (ref >60)
Glucose, Bld: 133 mg/dL — ABNORMAL HIGH (ref 70–99)
Phosphorus: 4.6 mg/dL (ref 2.5–4.6)
Potassium: 3.8 mmol/L (ref 3.5–5.1)
Sodium: 134 mmol/L — ABNORMAL LOW (ref 135–145)

## 2019-07-11 LAB — GLUCOSE, CAPILLARY
Glucose-Capillary: 118 mg/dL — ABNORMAL HIGH (ref 70–99)
Glucose-Capillary: 99 mg/dL (ref 70–99)
Glucose-Capillary: 100 mg/dL — ABNORMAL HIGH (ref 70–99)
Glucose-Capillary: 118 mg/dL — ABNORMAL HIGH (ref 70–99)

## 2019-07-11 LAB — CBC
HCT: 25.4 % — ABNORMAL LOW (ref 39.0–52.0)
Hemoglobin: 8.2 g/dL — ABNORMAL LOW (ref 13.0–17.0)
MCH: 29.8 pg (ref 26.0–34.0)
MCHC: 32.3 g/dL (ref 30.0–36.0)
MCV: 92.4 fL (ref 80.0–100.0)
Platelets: 36 10*3/uL — ABNORMAL LOW (ref 150–400)
RBC: 2.75 MIL/uL — ABNORMAL LOW (ref 4.22–5.81)
RDW: 20.3 % — ABNORMAL HIGH (ref 11.5–15.5)
WBC: 12.8 10*3/uL — ABNORMAL HIGH (ref 4.0–10.5)
nRBC: 0 % (ref 0.0–0.2)

## 2019-07-11 MED ORDER — AMIODARONE HCL 200 MG PO TABS
200.0000 mg | ORAL_TABLET | Freq: Two times a day (BID) | ORAL | Status: DC
  Administered 2019-07-11 – 2019-07-13 (×4): 200 mg via ORAL
  Filled 2019-07-11 (×4): qty 1

## 2019-07-11 MED ORDER — LIDOCAINE-PRILOCAINE 2.5-2.5 % EX CREA: 1.0000 "application " | TOPICAL_CREAM | CUTANEOUS | Status: DC | PRN

## 2019-07-11 MED ORDER — AMIODARONE HCL 200 MG PO TABS
200.0000 mg | ORAL_TABLET | Freq: Every day | ORAL | Status: DC
  Administered 2019-07-11: 200 mg via ORAL
  Filled 2019-07-11 (×2): qty 1

## 2019-07-11 MED ORDER — ALBUMIN HUMAN 25 % IV SOLN
12.5000 g | Freq: Four times a day (QID) | INTRAVENOUS | Status: AC
  Administered 2019-07-11 – 2019-07-12 (×3): 12.5 g via INTRAVENOUS
  Filled 2019-07-11 (×3): qty 50

## 2019-07-11 MED ORDER — SODIUM CHLORIDE 0.9 % IV SOLN: 100.0000 mL | INTRAVENOUS | Status: DC | PRN

## 2019-07-11 MED ORDER — COSYNTROPIN 0.25 MG IJ SOLR
0.2500 mg | Freq: Once | INTRAMUSCULAR | Status: AC
  Administered 2019-07-12: 0.25 mg via INTRAVENOUS
  Filled 2019-07-11: qty 0.25

## 2019-07-11 MED ORDER — HEPARIN SODIUM (PORCINE) 1000 UNIT/ML DIALYSIS: 1000.0000 [IU] | INTRAMUSCULAR | Status: DC | PRN

## 2019-07-11 MED ORDER — ALTEPLASE 2 MG IJ SOLR: 2.0000 mg | Freq: Once | INTRAMUSCULAR | Status: DC | PRN

## 2019-07-11 MED ORDER — LIDOCAINE HCL (PF) 1 % IJ SOLN: 5.0000 mL | INTRAMUSCULAR | Status: DC | PRN

## 2019-07-11 MED ORDER — PENTAFLUOROPROP-TETRAFLUOROETH EX AERO: 1.0000 "application " | INHALATION_SPRAY | CUTANEOUS | Status: DC | PRN

## 2019-07-11 NOTE — Progress Notes (Signed)
SLP Cancellation Note  Patient Details Name: RAKESH DUTKO MRN: [id number] DOB: 05-07-1950   Cancelled treatment:       Reason Eval/Treat Not Completed: SLP screened, no needs identified, will sign off. SLP reordered for swallowing after signing off on 07/06/19 when pt was on a regular consistency diet and thin liquids. Per discussion with MD there is not concern for new difficulties with swallowing, but more so a question of why the pt still had a Cortrak. Pt has been on a PO diet since 06/20/19 with gradual advancement of solids but no concerns for poor tolerance of diet. Cortrak had been in place due to concern for intake. Would consider reassessing pt's nutritional intake (calorie count?). MD in agreement with deferring swallow evaluation at this time.    Osie Bond., M.A. [facility] Pager [phone] Office [phone]  07/11/2019, 1:59 PM

## 2019-07-11 NOTE — Progress Notes (Signed)
Pt to Dialysis

## 2019-07-11 NOTE — Progress Notes (Signed)
PROGRESS NOTE  Gregory [name] NWG:[id number] DOB: Feb 21, 1951   PCP: Nicholos Johns, MD  Patient is from: Home.   DOA: 06/30/2019 LOS: 67  Brief Narrative / Interim history: 69-year-old male with history of ESRD on HD MWF, anemia, diastolic CHF, HTN, U-like aneurysm s/p stent grafting repair, known celiac and SMA aneurysm admitted 2/13 with septic shock with severe thrombocytopenia and found to have Pyocystitis with Citrobacter bacteremia.  He had bedside bladder aspiration by urology on 2/16.  He was managed in ICU and transferred to [facility] service on 2/23, only to return to ICU on 2/25 due to hypotension requiring pressors.  He was eventually weaned off IV pressors and remained relatively stable on high-dose midodrine and oral Solu-Cortef, and transferred back to [facility] on 07/07/2019.    Patient was on CRRT from 2/28-3/2 while in ICU.  Patient so seen by hematology for severe thrombocytopenia.  Also followed by palliative care.   PT/OT recommended CIR. However, CIR recommended SNF as patient needs prolonged recovery, and not yet at the level to tolerate intensity at CIR.   Subjective: Evaluated on HD this morning, and at bedside in the afternoon.  Hypotensive with systolic in 57Q and diastolic in 46N after and dialysis.  Feels tired and lethargic.  Heart rate in low 100s.  Respiratory rate in mid 20s.  Denies chest pain or dyspnea.  Denies GI symptoms.  Objective: Vitals:   07/11/19 1201 07/11/19 1224 07/11/19 1251 07/11/19 1351  BP: (!) 83/46 (!) 83/53 (!) 84/50 (!) 90/48  Pulse: (!) 114 (!) 107 (!) 105 99  Resp: (!) 24 (!) 26 (!) 26 (!) 24  Temp: 98.6 F (37 C)   98.9 F (37.2 C)  TempSrc: Oral   Axillary  SpO2: 98% 97% 98% 97%  Weight:      Height:        Intake/Output Summary (Last 24 hours) at 07/11/2019 1353 Last data filed at 07/11/2019 1038 Gross per 24 hour  Intake 0 ml  Output 947 ml  Net -947 ml   Filed Weights   07/10/19 0500 07/11/19 0700 07/11/19 1038  Weight: 100.2 kg 101.2  kg 98.9 kg    Examination:  GENERAL: Chronically ill-appearing and lethargic. HEENT: MMM.  On TF via cortrak. NECK: Supple.  No apparent JVD.  RESP: On RA.  No IWOB.  Fair aeration bilaterally. CVS: RRR.  HR ~100s. Heart sounds normal.  ABD/GI/GU: Bowel sounds present. Soft. Non tender.  MSK/EXT:  Moves extremities. No apparent deformity.  Dependent edema in extremities. SKIN: no apparent skin lesion or wound NEURO: Awake but lethargic.  Oriented appropriately.  No apparent focal neuro deficit. PSYCH: Calm but lethargic and tired looking.  Flat affect.  Procedures:  2/14-left IJ HD cath> 3/4 2/16-bedside bladder aspiration by urology 2/25-Foley catheter> 2/26  Assessment & Plan: Hypotension-worse after dialysis this morning although he only had 285 cc UF per chart.  He has not received morning Solu-Cortef.  Echo with normal LVEF and RVSP.  Completed antibiotic course for sepsis and pyocystitis.  Off IV pressors as of 3/3.  -Continue oral midodrine 20 mg 3 times daily and oral Solu-Cortef 10/20. -Give IV albumin -Check CXR  Normocytic anemia: Hgb 13.0 (admit)>>1u>> 6.9>1u> 8.8> 8.5> 8.2.  Iron sat 12%.  Also with significant thrombocytopenia.  Evaluated by hematology earlier in the course.  LDH and haptoglobin unrevealing.  -Continue monitoring -IV iron and ESA per nephrology  Severe acute on chronic thrombocytopenia: Platelets~100 before 2019> 17 (admit)>> 6>> 12>17> 36.  Unclear etiology but improving.  Hematology consulted earlier in the course.  LDH and haptoglobin unrevealing. -Recommended transfusion for platelets less than 10K or bleeding  A. fib with RVR: Now with mild RVR -On amiodarone 200 mg twice daily 2/26>>May reduce to 200 mg daily after 3 weeks. 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 Left measures 3.5 cm in diameter, R  3.3 cm.  -Outpatient follow-up  ESRD MWF/BMD: CRRT from 2/15-2/21> regular  IHD on 2/26> hypotension >CRRT on 2/28-3/2> IHD -Left IJ HD cath removed on 3/4. -IHD MWF per nephrology.  Left renal mass -Outpatient follow-up and evaluation  Septic shock due to Pyocystitis and Citrobacter bacteremia-s/p bedside bladder aspiration by urology on 2/16.  Completed antibiotic course and resolved.  However, continues to require high-dose midodrine and oral Solu-Cortef.  Leukocytosis with bandemia-demargination from steroid?  No fever.  Improved. -Continue monitoring  Acute metabolic encephalopathy-resolved  Debility/generalized weakness/physical deconditioning -PT/OT  Goal of care-very debilitated patient with significant comorbidities as above.  Still full code with full scope of care.  Requiring NGT for feeding due to poor p.o. intake. -Palliative care following.  Severe protein calorie malnutrition: Requiring TF through cortrak due to poor p.o. intake.  Cleared by SLP from dysphagia standpoint. -May need PEG if oral intake remains inadequate -Will ask RD to evaluate oral intake-we may have to cut down on tube feed Nutrition Problem: Severe Malnutrition Etiology: chronic illness(ESRD on HD)  Signs/Symptoms: severe muscle depletion, severe fat depletion  Interventions: Tube feeding, Prostat   DVT prophylaxis: SCD in the setting of severe thrombocytopenia Code Status: Full code Family Communication: Updated patient's wife at bedside.  Discharge barrier: Hypotension, lethargy and reliable route of feeding.  Patient is from: Home Final disposition: SNF when medically stable  Consultants:  Nephrology 2/14 Urology 2/16 Hematology 2/22 Palliative care 2/26   Microbiology summarized: 2/14 respiratory viral panel>> negative 2/14 blood cultures x2>> Citrobacter, sensitivities below 2/15 UCx Citrobacter pansensitive  2/25 BCx2 >>ng  Antibiotics summarized Flagyl 2/14  only Cefepime 2/14 > 2/15 Vancomycin 2/14 > 2/15 Meropenem 2/15 > 2/16 Ceftriaxone 2/16 > 2/22 Cefazolin 2/23 > 2/27  Sch Meds:  Scheduled Meds:  amiodarone  200 mg Oral BID   Chlorhexidine Gluconate Cloth  6 each Topical Q0600   darbepoetin (ARANESP) injection - NON-DIALYSIS  60 mcg Subcutaneous Q Fri-1800   famotidine  10 mg Oral Daily   feeding supplement (PRO-STAT SUGAR FREE 64)  60 mL Per Tube TID   hydrocortisone  10 mg Oral q AM   hydrocortisone  20 mg Oral QPM   mouth rinse  15 mL Mouth Rinse BID   midodrine  20 mg Oral TID WC   multivitamin  1 tablet Oral QHS   sodium chloride flush  10-40 mL Intracatheter Q12H   Continuous Infusions:  sodium chloride Stopped (07/02/19 1258)   sodium chloride Stopped (06/30/19 1303)   albumin human     feeding supplement (NEPRO CARB STEADY) 1,000 mL (07/08/19 1841)   ferric gluconate (FERRLECIT/NULECIT) IV 125 mg (07/11/19 1038)   PRN Meds:.acetaminophen, alteplase, loperamide, ondansetron (ZOFRAN) IV, phenol, sodium chloride, sodium chloride flush, traZODone  Antimicrobials: Anti-infectives (From admission, onward)   Start     Dose/Rate Route Frequency Ordered Stop   06/28/19 0930  ceFAZolin (ANCEF) IVPB 1 g/50 mL premix     1 g 100 mL/hr over 30 Minutes Intravenous Every 24 hours 06/28/19  0981 07/02/19 1028   06/21/19 1400  cefTRIAXone (ROCEPHIN) 2 g in sodium chloride 0.9 % 100 mL IVPB  Status:  Discontinued     2 g 200 mL/hr over 30 Minutes Intravenous Every 24 hours 06/21/19 1002 06/28/19 0924   06/20/19 1200  vancomycin (VANCOCIN) IVPB 1000 mg/200 mL premix  Status:  Discontinued     1,000 mg 200 mL/hr over 60 Minutes Intravenous Every M-W-F (Hemodialysis) 06/19/19 0232 06/20/19 1109   06/20/19 1200  meropenem (MERREM) 1 g in sodium chloride 0.9 % 100 mL IVPB  Status:  Discontinued     1 g 200 mL/hr over 30 Minutes Intravenous Every 8 hours 06/20/19 1112 06/21/19 1002   06/19/19 2200  ceFEPIme (MAXIPIME) 1 g  in sodium chloride 0.9 % 100 mL IVPB  Status:  Discontinued     1 g 200 mL/hr over 30 Minutes Intravenous Every 24 hours 06/19/19 0232 06/20/19 1109   06/19/19 0230  vancomycin (VANCOREADY) IVPB 750 mg/150 mL     750 mg 150 mL/hr over 60 Minutes Intravenous  Once 06/19/19 0227 06/19/19 0435   06/21/2019 2330  ceFEPIme (MAXIPIME) 2 g in sodium chloride 0.9 % 100 mL IVPB     2 g 200 mL/hr over 30 Minutes Intravenous  Once 06/22/2019 2329 06/19/19 0052   06/25/2019 2330  metroNIDAZOLE (FLAGYL) IVPB 500 mg     500 mg 100 mL/hr over 60 Minutes Intravenous  Once 06/19/2019 2329 06/19/19 0205   06/14/2019 2330  vancomycin (VANCOCIN) IVPB 1000 mg/200 mL premix     1,000 mg 200 mL/hr over 60 Minutes Intravenous  Once 06/23/2019 2329 06/19/19 0205       I have personally reviewed the following labs and images: CBC: Recent Labs  Lab 07/06/19 0532 07/07/19 0951 07/08/19 0325 07/09/19 0748 07/11/19 0715  WBC 14.2* 17.1* 19.1*   18.6* 13.5* 12.8*  NEUTROABS  --   --  17.3*  --   --   HGB 7.3* 6.9* 8.9*   8.8* 8.5* 8.2*  HCT 21.5* 20.9* 25.8*   25.7* 25.3* 25.4*  MCV 86.7 88.9 87.5   86.8 88.8 92.4  PLT 11* 12* 19*   15* 17* 36*   BMP &GFR Recent Labs  Lab 07/05/19 0504 07/05/19 1815 07/06/19 0532 07/06/19 0532 07/06/19 1551 07/07/19 0951 07/08/19 0325 07/09/19 0708 07/11/19 0715  NA 139   < > 137   < > 138 136 139 136 134*  K 3.1*   < > 3.4*   < > 3.5 3.5 3.5 3.8 3.8  CL 105   < > 103   < > 103 102 102 99 96*  CO2 26   < > 24   < > 25 23 23 23 22   GLUCOSE 115*   < > 106*   < > 99 106* 125* 105* 133*  BUN 28*   < > 64*   < > 79* 110* 138* 80* 145*  CREATININE 1.51*   < > 2.44*   < > 2.94* 3.63* 4.16* 3.06* 4.82*  CALCIUM 7.2*   < > 7.1*   < > 7.2* 7.3* 7.8* 7.6* 7.9*  MG 2.2  --  2.0  --   --  2.1 2.2 1.9  --   PHOS 1.6*   < > 1.6*   < > 1.9* 2.2* 2.8 3.0 4.6   < > = values in this interval not displayed.   Estimated Creatinine Clearance: 17.5 mL/min (A) (by C-G formula based on SCr  of 4.82 mg/dL (H)). Liver & Pancreas: Recent Labs  Lab 07/06/19 1551 07/07/19 0951 07/08/19 0325 07/09/19 0708 07/11/19 0715  ALBUMIN 2.1* 2.0* 2.2* 2.0* 2.0*   No results for input(s): LIPASE, AMYLASE in the last 168 hours. No results for input(s): AMMONIA in the last 168 hours. Diabetic: No results for input(s): HGBA1C in the last 72 hours. Recent Labs  Lab 07/10/19 1555 07/10/19 1951 07/10/19 2311 07/11/19 0342 07/11/19 1215  GLUCAP 132* 141* 145* 118* 99   Cardiac Enzymes: No results for input(s): CKTOTAL, CKMB, CKMBINDEX, TROPONINI in the last 168 hours. No results for input(s): PROBNP in the last 8760 hours. Coagulation Profile: No results for input(s): INR, PROTIME in the last 168 hours. Thyroid Function Tests: No results for input(s): TSH, T4TOTAL, FREET4, T3FREE, THYROIDAB in the last 72 hours. Lipid Profile: No results for input(s): CHOL, HDL, LDLCALC, TRIG, CHOLHDL, LDLDIRECT in the last 72 hours. Anemia Panel: Recent Labs    07/09/19 1024  FERRITIN 255  TIBC 230*  IRON 27*   Urine analysis:    Component Value Date/Time   COLORURINE YELLOW 12/11/2012 1338   APPEARANCEUR TURBID (A) 12/11/2012 1338   LABSPEC 1.014 12/11/2012 1338   PHURINE 8.0 12/11/2012 1338   GLUCOSEU NEGATIVE 12/11/2012 1338   HGBUR MODERATE (A) 12/11/2012 1338   BILIRUBINUR NEGATIVE 12/11/2012 1338   KETONESUR NEGATIVE 12/11/2012 1338   PROTEINUR >300 (A) 12/11/2012 1338   UROBILINOGEN 0.2 12/11/2012 1338   NITRITE NEGATIVE 12/11/2012 1338   LEUKOCYTESUR LARGE (A) 12/11/2012 1338   Sepsis Labs: Invalid input(s): PROCALCITONIN, [place]  Microbiology: No results found for this or any previous visit (from the past 240 hour(s)).  Radiology Studies: No results found.    Jaspreet Hollings T. Diablock  If 7PM-7AM, please contact night-coverage [email or web address] Password [email or web address] 07/11/2019, 1:53 PM

## 2019-07-11 NOTE — Progress Notes (Signed)
[name] Progress Note   Subjective:   Seen on HD, sleeping but rouses to verbal stimuli. Denies CP and SOB, ROS limited. No new events overnight.   Objective Vitals:   07/11/19 0830 07/11/19 0900 07/11/19 0930 07/11/19 1000  BP: (!) 82/38 (!) 85/36 (!) 94/44 (!) 88/38  Pulse: (!) 117 (!) 114 (!) 121 (!) 111  Resp:      Temp:      TempSrc:      SpO2:      Weight:      Height:       Physical Exam General: Chronically ill appearing male, resting on HD in NAD Heart: Slightly tachycardic, regular rhythm, no murmurs, rubs or gallops Lungs: Decreased breath sounds bilateral bases, no wheezing/rhonchi/rales auscultated Abdomen: Soft, non-tender, non-distended, +BS Extremities: 2+ edema bilateral upper extremities and hips Dialysis Access: Aneurysmal RUE AVF currently accessed, no skin thinning noted  Additional Objective Labs: Basic Metabolic Panel: Recent Labs  Lab 07/08/19 0325 07/09/19 0708 07/11/19 0715  NA 139 136 134*  K 3.5 3.8 3.8  CL 102 99 96*  CO2 23 23 22   GLUCOSE 125* 105* 133*  BUN 138* 80* 145*  CREATININE 4.16* 3.06* 4.82*  CALCIUM 7.8* 7.6* 7.9*  PHOS 2.8 3.0 4.6   Liver Function Tests: Recent Labs  Lab 07/08/19 0325 07/09/19 0708 07/11/19 0715  ALBUMIN 2.2* 2.0* 2.0*   CBC: Recent Labs  Lab 07/06/19 0532 07/06/19 0532 07/07/19 0951 07/07/19 0951 07/08/19 0325 07/09/19 0748 07/11/19 0715  WBC 14.2*   < > 17.1*   < > 19.1*  18.6* 13.5* 12.8*  NEUTROABS  --   --   --   --  17.3*  --   --   HGB 7.3*   < > 6.9*   < > 8.9*  8.8* 8.5* 8.2*  HCT 21.5*   < > 20.9*   < > 25.8*  25.7* 25.3* 25.4*  MCV 86.7  --  88.9  --  87.5  86.8 88.8 92.4  PLT 11*   < > 12*   < > 19*  15* 17* 36*   < > = values in this interval not displayed.   Blood Culture    Component Value Date/Time   SDES BLOOD LEFT HAND 06/30/2019 1400   SDES BLOOD LEFT HAND 06/30/2019 1400   SPECREQUEST  06/30/2019 1400    BOTTLES DRAWN AEROBIC ONLY Blood  Culture adequate volume   SPECREQUEST  06/30/2019 1400    BOTTLES DRAWN AEROBIC ONLY Blood Culture results may not be optimal due to an inadequate volume of blood received in culture bottles   CULT  06/30/2019 1400    NO GROWTH 5 DAYS Performed at [facility], [place] [address], [place], [place] [address]    CULT  06/30/2019 1400    NO GROWTH 5 DAYS Performed at [facility], [place] [address], [place], [place] [address]    REPTSTATUS 07/05/2019 FINAL 06/30/2019 1400   REPTSTATUS 07/05/2019 FINAL 06/30/2019 1400    CBG: Recent Labs  Lab 07/10/19 1228 07/10/19 1555 07/10/19 1951 07/10/19 2311 07/11/19 0342  GLUCAP 114* 132* 141* 145* 118*   Iron Studies:  Recent Labs    07/09/19 1024  IRON 27*  TIBC 230*  FERRITIN 255   Medications: . sodium chloride Stopped (07/02/19 1258)  . sodium chloride Stopped (06/30/19 1303)  . sodium chloride    . sodium chloride    . feeding supplement (NEPRO CARB STEADY) 1,000 mL (  07/08/19 1841)  . ferric gluconate (FERRLECIT/NULECIT) IV     . amiodarone  200 mg Oral Daily  . Chlorhexidine Gluconate Cloth  6 each Topical Q0600  . darbepoetin (ARANESP) injection - NON-DIALYSIS  60 mcg Subcutaneous Q Fri-1800  . famotidine  10 mg Oral Daily  . feeding supplement (PRO-STAT SUGAR FREE 64)  60 mL Per Tube TID  . hydrocortisone  10 mg Oral q AM  . hydrocortisone  20 mg Oral QPM  . mouth rinse  15 mL Mouth Rinse BID  . midodrine  20 mg Oral TID WC  . multivitamin  1 tablet Oral QHS  . sodium chloride flush  10-40 mL Intracatheter Q12H    Dialysis Orders: [place] MWF 3h 38min 400/800 73kg 2/3.5Ca bath P4 AVF Hep 3300 No VDRA/ESA   Assessment/Plan: 1. ESRD: On CRRT from 2/15-2/21 then again 2/8-3/2 due to septic shock. Intermittent HD resumed 3/5, tolerating well but BP very soft limiting volume removal. No heparin.  2. Citrobacter sepsis: Seen by urologist and thought to be pyelocystitis status post Foley drainage.   Completed antibiotics. 3. HTN/volume:  BP soft on midodrine 20mg  TID, hydrocortisone. Well over EDW.  Volume overloaded, UF with HD as tolerated.  4. Anemia: Hgb 8.2. On aranesp 74mcg weekly. Tsat 12%, started on IV Fe. Transfuse PRN.  5. Secondary hyperparathyroidism:  Phos low on admission requiring repletion, now 5.6. Not on a binder. Corrected calcium 9.5. Not on VDRA.  6. Nutrition:   Alb 2.0. Renal diet with fluid restriction.  7. Dispo: Remains full scope of care.   Anice Paganini, PA-C 07/11/2019, 10:18 AM  [facility] Pager: [phone]

## 2019-07-12 ENCOUNTER — Inpatient Hospital Stay (HOSPITAL_COMMUNITY): Payer: Medicare Other

## 2019-07-12 DIAGNOSIS — R601 Generalized edema: Secondary | ICD-10-CM

## 2019-07-12 DIAGNOSIS — E8809 Other disorders of plasma-protein metabolism, not elsewhere classified: Secondary | ICD-10-CM

## 2019-07-12 DIAGNOSIS — J810 Acute pulmonary edema: Secondary | ICD-10-CM

## 2019-07-12 DIAGNOSIS — I469 Cardiac arrest, cause unspecified: Secondary | ICD-10-CM

## 2019-07-12 LAB — CBC
HCT: 26.1 % — ABNORMAL LOW (ref 39.0–52.0)
HCT: 22.9 % — ABNORMAL LOW (ref 39.0–52.0)
Hemoglobin: 8.3 g/dL — ABNORMAL LOW (ref 13.0–17.0)
Hemoglobin: 7.3 g/dL — ABNORMAL LOW (ref 13.0–17.0)
MCH: 30 pg (ref 26.0–34.0)
MCH: 29.3 pg (ref 26.0–34.0)
MCHC: 31.8 g/dL (ref 30.0–36.0)
MCHC: 31.9 g/dL (ref 30.0–36.0)
MCV: 94.2 fL (ref 80.0–100.0)
MCV: 92 fL (ref 80.0–100.0)
Platelets: 48 10*3/uL — ABNORMAL LOW (ref 150–400)
Platelets: 57 10*3/uL — ABNORMAL LOW (ref 150–400)
RBC: 2.77 MIL/uL — ABNORMAL LOW (ref 4.22–5.81)
RBC: 2.49 MIL/uL — ABNORMAL LOW (ref 4.22–5.81)
RDW: 20.4 % — ABNORMAL HIGH (ref 11.5–15.5)
RDW: 20.2 % — ABNORMAL HIGH (ref 11.5–15.5)
WBC: 11.7 10*3/uL — ABNORMAL HIGH (ref 4.0–10.5)
WBC: 15.3 10*3/uL — ABNORMAL HIGH (ref 4.0–10.5)
nRBC: 0 % (ref 0.0–0.2)
nRBC: 0.1 % (ref 0.0–0.2)

## 2019-07-12 LAB — GLUCOSE, CAPILLARY
Glucose-Capillary: 105 mg/dL — ABNORMAL HIGH (ref 70–99)
Glucose-Capillary: 105 mg/dL — ABNORMAL HIGH (ref 70–99)
Glucose-Capillary: 108 mg/dL — ABNORMAL HIGH (ref 70–99)
Glucose-Capillary: 138 mg/dL — ABNORMAL HIGH (ref 70–99)
Glucose-Capillary: 70 mg/dL (ref 70–99)
Glucose-Capillary: 99 mg/dL (ref 70–99)
Glucose-Capillary: 99 mg/dL (ref 70–99)

## 2019-07-12 LAB — RENAL FUNCTION PANEL
Albumin: 2.7 g/dL — ABNORMAL LOW (ref 3.5–5.0)
Albumin: 2.5 g/dL — ABNORMAL LOW (ref 3.5–5.0)
Anion gap: 14 (ref 5–15)
Anion gap: 12 (ref 5–15)
BUN: 96 mg/dL — ABNORMAL HIGH (ref 8–23)
BUN: 108 mg/dL — ABNORMAL HIGH (ref 8–23)
CO2: 25 mmol/L (ref 22–32)
CO2: 25 mmol/L (ref 22–32)
Calcium: 8.4 mg/dL — ABNORMAL LOW (ref 8.9–10.3)
Calcium: 8 mg/dL — ABNORMAL LOW (ref 8.9–10.3)
Chloride: 98 mmol/L (ref 98–111)
Chloride: 99 mmol/L (ref 98–111)
Creatinine, Ser: 3.46 mg/dL — ABNORMAL HIGH (ref 0.61–1.24)
Creatinine, Ser: 3.74 mg/dL — ABNORMAL HIGH (ref 0.61–1.24)
GFR calc Af Amer: 20 mL/min — ABNORMAL LOW (ref >60)
GFR calc Af Amer: 18 mL/min — ABNORMAL LOW (ref >60)
GFR calc non Af Amer: 17 mL/min — ABNORMAL LOW (ref >60)
GFR calc non Af Amer: 15 mL/min — ABNORMAL LOW (ref >60)
Glucose, Bld: 104 mg/dL — ABNORMAL HIGH (ref 70–99)
Glucose, Bld: 104 mg/dL — ABNORMAL HIGH (ref 70–99)
Phosphorus: 3.7 mg/dL (ref 2.5–4.6)
Phosphorus: 3.9 mg/dL (ref 2.5–4.6)
Potassium: 3.7 mmol/L (ref 3.5–5.1)
Potassium: 3.1 mmol/L — ABNORMAL LOW (ref 3.5–5.1)
Sodium: 137 mmol/L (ref 135–145)
Sodium: 136 mmol/L (ref 135–145)

## 2019-07-12 LAB — POCT I-STAT 7, (LYTES, BLD GAS, ICA,H+H)
Acid-base deficit: 8 mmol/L — ABNORMAL HIGH (ref 0.0–2.0)
Bicarbonate: 17.6 mmol/L — ABNORMAL LOW (ref 20.0–28.0)
Calcium, Ion: 1.14 mmol/L — ABNORMAL LOW (ref 1.15–1.40)
HCT: 16 % — ABNORMAL LOW (ref 39.0–52.0)
Hemoglobin: 5.4 g/dL — CL (ref 13.0–17.0)
O2 Saturation: 100 %
Patient temperature: 97.6
Potassium: 3.5 mmol/L (ref 3.5–5.1)
Sodium: 136 mmol/L (ref 135–145)
TCO2: 19 mmol/L — ABNORMAL LOW (ref 22–32)
pCO2 arterial: 32.4 mmHg (ref 32.0–48.0)
pH, Arterial: 7.339 — ABNORMAL LOW (ref 7.350–7.450)
pO2, Arterial: 487 mmHg — ABNORMAL HIGH (ref 83.0–108.0)

## 2019-07-12 LAB — ACTH STIMULATION, 3 TIME POINTS
Cortisol, 30 Min: 29.4 ug/dL
Cortisol, 60 Min: 28 ug/dL
Cortisol, Base: 28.7 ug/dL

## 2019-07-12 LAB — PREPARE RBC (CROSSMATCH)

## 2019-07-12 LAB — MAGNESIUM: Magnesium: 1.9 mg/dL (ref 1.7–2.4)

## 2019-07-12 LAB — TSH: TSH: 3.02 u[IU]/mL (ref 0.350–4.500)

## 2019-07-12 MED ORDER — CALCIUM CHLORIDE 10 % IV SOLN
INTRAVENOUS | Status: AC
  Filled 2019-07-12: qty 10

## 2019-07-12 MED ORDER — NOREPINEPHRINE 4 MG/250ML-% IV SOLN
INTRAVENOUS | Status: AC
  Administered 2019-07-12: 40 ug/min
  Filled 2019-07-12: qty 250

## 2019-07-12 MED ORDER — SODIUM CHLORIDE 0.9 % IV SOLN: 100.0000 mL | INTRAVENOUS | Status: DC | PRN

## 2019-07-12 MED ORDER — PRISMASOL BGK 4/2.5 32-4-2.5 MEQ/L IV SOLN
INTRAVENOUS | Status: DC
  Filled 2019-07-12 (×8): qty 5000

## 2019-07-12 MED ORDER — ALTEPLASE 2 MG IJ SOLR: 2.0000 mg | Freq: Once | INTRAMUSCULAR | Status: DC | PRN

## 2019-07-12 MED ORDER — PHENYLEPHRINE HCL-NACL 10-0.9 MG/250ML-% IV SOLN
INTRAVENOUS | Status: AC
  Filled 2019-07-12: qty 250

## 2019-07-12 MED ORDER — VASOPRESSIN 20 UNIT/ML IV SOLN
0.0400 [IU]/min | INTRAVENOUS | Status: DC
  Administered 2019-07-12 – 2019-07-14 (×3): 0.03 [IU]/min via INTRAVENOUS
  Administered 2019-07-15: 0.04 [IU]/min via INTRAVENOUS
  Filled 2019-07-12 (×4): qty 2

## 2019-07-12 MED ORDER — PENTAFLUOROPROP-TETRAFLUOROETH EX AERO: 1.0000 "application " | INHALATION_SPRAY | CUTANEOUS | Status: DC | PRN

## 2019-07-12 MED ORDER — PHENYLEPHRINE HCL-NACL 10-0.9 MG/250ML-% IV SOLN
0.0000 ug/min | INTRAVENOUS | Status: DC
  Administered 2019-07-12 (×2): 250 ug/min via INTRAVENOUS
  Administered 2019-07-12: 15:00:00 10 ug/min via INTRAVENOUS
  Administered 2019-07-12: 250 ug/min via INTRAVENOUS
  Filled 2019-07-12 (×2): qty 500

## 2019-07-12 MED ORDER — ORAL CARE MOUTH RINSE
15.0000 mL | OROMUCOSAL | Status: DC
  Administered 2019-07-12 – 2019-07-15 (×30): 15 mL via OROMUCOSAL

## 2019-07-12 MED ORDER — POTASSIUM CHLORIDE 10 MEQ/50ML IV SOLN
10.0000 meq | INTRAVENOUS | Status: AC
  Administered 2019-07-12 (×2): 10 meq via INTRAVENOUS
  Filled 2019-07-12: qty 50

## 2019-07-12 MED ORDER — PHENYLEPHRINE CONCENTRATED 100MG/250ML (0.4 MG/ML) INFUSION SIMPLE
0.0000 ug/min | INTRAVENOUS | Status: DC
  Administered 2019-07-12: 300 ug/min via INTRAVENOUS
  Administered 2019-07-13 (×5): 400 ug/min via INTRAVENOUS
  Administered 2019-07-14: 260 ug/min via INTRAVENOUS
  Administered 2019-07-14 (×2): 400 ug/min via INTRAVENOUS
  Administered 2019-07-15: 100 ug/min via INTRAVENOUS
  Filled 2019-07-12 (×11): qty 250

## 2019-07-12 MED ORDER — LIDOCAINE-PRILOCAINE 2.5-2.5 % EX CREA
1.0000 "application " | TOPICAL_CREAM | CUTANEOUS | Status: DC | PRN
  Filled 2019-07-12: qty 5

## 2019-07-12 MED ORDER — PHENYLEPHRINE HCL-NACL 20-0.9 MG/250ML-% IV SOLN
0.0000 ug/min | INTRAVENOUS | Status: DC
  Administered 2019-07-12: 300 ug/min via INTRAVENOUS
  Filled 2019-07-12 (×4): qty 250

## 2019-07-12 MED ORDER — FENTANYL 2500MCG IN NS 250ML (10MCG/ML) PREMIX INFUSION
0.0000 ug/h | INTRAVENOUS | Status: DC
  Administered 2019-07-12: 25 ug/h via INTRAVENOUS

## 2019-07-12 MED ORDER — SODIUM CHLORIDE 0.9 % FOR CRRT
INTRAVENOUS_CENTRAL | Status: DC | PRN
  Filled 2019-07-12: qty 1000

## 2019-07-12 MED ORDER — EPINEPHRINE HCL 5 MG/250ML IV SOLN IN NS
0.5000 ug/min | INTRAVENOUS | Status: DC
  Administered 2019-07-12: 10 ug/min via INTRAVENOUS
  Administered 2019-07-12 – 2019-07-13 (×2): 20 ug/min via INTRAVENOUS
  Administered 2019-07-13: 16 ug/min via INTRAVENOUS
  Administered 2019-07-13: 10 ug/min via INTRAVENOUS
  Administered 2019-07-13: 13 ug/min via INTRAVENOUS
  Filled 2019-07-12 (×7): qty 250

## 2019-07-12 MED ORDER — EPINEPHRINE 1 MG/10ML IJ SOSY
PREFILLED_SYRINGE | INTRAMUSCULAR | Status: AC
  Filled 2019-07-12: qty 30

## 2019-07-12 MED ORDER — LIDOCAINE HCL (PF) 1 % IJ SOLN: 5.0000 mL | INTRAMUSCULAR | Status: DC | PRN

## 2019-07-12 MED ORDER — PRISMASOL BGK 4/2.5 32-4-2.5 MEQ/L REPLACEMENT SOLN
Status: DC
  Filled 2019-07-12 (×2): qty 5000

## 2019-07-12 MED ORDER — HEPARIN SODIUM (PORCINE) 1000 UNIT/ML DIALYSIS
1000.0000 [IU] | INTRAMUSCULAR | Status: DC | PRN
  Administered 2019-07-15: 1200 [IU] via INTRAVENOUS_CENTRAL
  Filled 2019-07-12: qty 1

## 2019-07-12 MED ORDER — SODIUM CHLORIDE 0.9% IV SOLUTION: Freq: Once | INTRAVENOUS | Status: AC

## 2019-07-12 MED ORDER — VITAL 1.5 CAL PO LIQD
1000.0000 mL | ORAL | Status: DC
  Administered 2019-07-13: 1000 mL
  Filled 2019-07-12 (×2): qty 1000

## 2019-07-12 MED ORDER — MIDAZOLAM HCL 2 MG/2ML IJ SOLN
INTRAMUSCULAR | Status: AC
  Filled 2019-07-12: qty 4

## 2019-07-12 MED ORDER — CHLORHEXIDINE GLUCONATE 0.12% ORAL RINSE (MEDLINE KIT)
15.0000 mL | Freq: Two times a day (BID) | OROMUCOSAL | Status: DC
  Administered 2019-07-12 – 2019-07-15 (×6): 15 mL via OROMUCOSAL

## 2019-07-12 MED ORDER — MAGNESIUM SULFATE 2 GM/50ML IV SOLN
2.0000 g | Freq: Once | INTRAVENOUS | Status: AC
  Administered 2019-07-12: 2 g via INTRAVENOUS

## 2019-07-12 MED ORDER — NOREPINEPHRINE 16 MG/250ML-% IV SOLN
0.0000 ug/min | INTRAVENOUS | Status: DC
  Administered 2019-07-12 – 2019-07-13 (×5): 40 ug/min via INTRAVENOUS
  Administered 2019-07-14: 17:00:00 28 ug/min via INTRAVENOUS
  Administered 2019-07-14: 30 ug/min via INTRAVENOUS
  Administered 2019-07-15: 100 ug/min via INTRAVENOUS
  Administered 2019-07-15: 30 ug/min via INTRAVENOUS
  Filled 2019-07-12 (×10): qty 250

## 2019-07-12 MED ORDER — PRISMASOL BGK 4/2.5 32-4-2.5 MEQ/L REPLACEMENT SOLN
Status: DC
  Filled 2019-07-12: qty 5000

## 2019-07-12 MED ORDER — HEPARIN SODIUM (PORCINE) 1000 UNIT/ML DIALYSIS
1000.0000 [IU] | INTRAMUSCULAR | Status: DC | PRN
  Administered 2019-07-14: 1200 [IU] via INTRAVENOUS_CENTRAL
  Filled 2019-07-12 (×2): qty 6
  Filled 2019-07-12: qty 2

## 2019-07-12 MED FILL — Medication: Qty: 1 | Status: AC

## 2019-07-12 NOTE — Progress Notes (Signed)
Report given to Roselyn Reef, RN in ICU 4N. Patient to be transferred.

## 2019-07-12 NOTE — Progress Notes (Signed)
eLink Physician-Brief Progress Note Patient Name: [name] DOB: 01-24-1951 MRN: [id number]   Date of Service  07/12/2019  HPI/Events of Note  Pt with hemoglobin of 5.9 s/p CRRT.   eICU Interventions  Transfuse 3 units PRBC.        Kerry Kass Zeinab Rodwell 07/12/2019, 11:10 PM

## 2019-07-12 NOTE — Progress Notes (Signed)
Patient transferred to 4N 25 Telemetry removed by ICU nurse to be connected to monitor in room.

## 2019-07-12 NOTE — Procedures (Addendum)
Hemodialysis Catheter Insertion Procedure Note ALOYSIOUS VANGIESON [id number] 02-16-1951  Procedure: Insertion of Hemodialysis Catheter Indications: Dialysis Access   Procedure Details Consent: Risks (including but not limited to bleeding especially in setting of thrombocytopenia, infection, pneumothorax, dysrhythmia, and death) benefits and alternatives discussed with the patient prior to transfer to ICU at which time patient was verbally in agreement with replacement of temporary HD catheter placement. Upon arrival to ICU, patient is very lethargic and is not awake enough to sign consent form. Attempt made to contact [facility] without success. Proceeding with placement of HD catheter based on verbal discussion with patient 07/12/2019 prior to ICU transfer,consent for initial placement earlier this admission, and medical necessity.  Time Out: Verified patient identification, verified procedure, site/side was marked, verified correct patient position, special equipment/implants available, medications/allergies/relevent history reviewed, required imaging and test results available.  Performed  Maximum sterile technique was used including antiseptics, cap, gloves, gown, hand hygiene, mask and sheet. Skin prep: Chlorhexidine; local anesthetic administered Triple lumen hemodialysis catheter was inserted into left internal jugular vein using the Seldinger technique. [place] used throughout in real time for vessel identification, introducer needle placement, verification of guidewire in vessel.   Evaluation Blood flow good Complications: No apparent complications Patient did tolerate procedure well. Chest X-ray ordered to verify placement.  CXR: pending.   Laurel Dimmer Cyerra Yim AGACNP-BC 07/12/2019

## 2019-07-12 NOTE — Progress Notes (Signed)
PROGRESS NOTE  Gregory [name] UUV:[id number] DOB: 18-Jul-1950   PCP: Nicholos Johns, MD  Patient is from: Home.   DOA: 06/25/2019 LOS: 23  Brief Narrative / Interim history: 69-year-old male with history of ESRD on HD MWF, anemia, diastolic CHF, HTN, U-like aneurysm s/p stent grafting repair, known celiac and SMA aneurysm admitted 2/13 with septic shock with severe thrombocytopenia and found to have Pyocystitis with Citrobacter bacteremia.  He had bedside bladder aspiration by urology on 2/16.  He was managed in ICU and transferred to [facility] service on 2/23, only to return to ICU on 2/25 due to hypotension requiring pressors.  He was eventually weaned off IV pressors and remained relatively stable on high-dose midodrine and oral Solu-Cortef, and transferred back to [facility] on 07/07/2019.    Patient was on CRRT from 2/28-3/2 while in ICU.  Patient so seen by hematology for severe thrombocytopenia.  Also followed by palliative care.   PT/OT recommended CIR. However, CIR recommended SNF as patient needs prolonged recovery, and not yet at the level to tolerate intensity at CIR.   Subjective: No major events overnight or this morning.  Feels weak.  Remains hypotensive and lethargic.  Tachycardia and tachypnea resolved.  Appropriate saturation on room air.  Denies pain.  Objective: Vitals:   07/11/19 1837 07/11/19 2038 07/12/19 0219 07/12/19 0413  BP: (!) 90/53 (!) 101/36 (!) 94/42 (!) 96/45  Pulse: 81 97  91  Resp: (!) 30 20  19   Temp: 99.3 F (37.4 C) 98 F (36.7 C)  98 F (36.7 C)  TempSrc: Oral Oral  Oral  SpO2: 93% 92%  97%  Weight:      Height:        Intake/Output Summary (Last 24 hours) at 07/12/2019 1031 Last data filed at 07/12/2019 0930 Gross per 24 hour  Intake 10 ml  Output 1747 ml  Net -1737 ml   Filed Weights   07/10/19 0500 07/11/19 0700 07/11/19 1038  Weight: 100.2 kg 101.2 kg 98.9 kg    Examination:  GENERAL: Chronically ill-appearing and lethargic. HEENT: MMM.  On TF via  cortrack. NECK: Supple.  No apparent JVD.  RESP: On room air.  Some increased work of breathing.  Rhonchi bilaterally. CVS:  RRR. Heart sounds normal.  ABD/GI/GU: Bowel sounds present. Soft. Non tender.  MSK/EXT:  Moves extremities. No apparent deformity.  Dependent edema in extremities. SKIN: no apparent skin lesion or wound NEURO: Awake, alert and oriented fairly.  No apparent focal neuro deficit. PSYCH:  Lethargic with flat affect.  Procedures:  2/14-left IJ HD cath> 3/4 2/16-bedside bladder aspiration by urology 2/25-Foley catheter> 2/26  Assessment & Plan: Fluid overload/anasarca with hypotension in ESRD patient: Weight up over 20 kg.  CXR with worsening pulmonary edema.  Nephrology not able to take fluid off with HD due to hypotension despite high-dose midodrine and oral Solu-Cortef. Echo with normal LVEF and RVSP.  Completed antibiotic course for sepsis and pyocystitis.  Has no leukocytosis or fever to suggest infection.  ACTH stimulation test might not be reliable as patient is already on hydrocortisone. -Requested PCCM to move patient to ICU for IV pressor so nephrology can start CRRT. -Discussed with nephrology.  Needs HD catheter for CRRT. -Continue oral midodrine 20 mg 3 times daily and oral Solu-Cortef 10/20.   Normocytic anemia: Hgb 13.0 (admit)>>1u>> 6.9>1u> 8.8> 8.5>8.2> 8.3.  Iron sat 12%.  Also with significant thrombocytopenia but improving.  Evaluated by hematology earlier in the course.  LDH and haptoglobin unrevealing.  -  Continue monitoring -IV iron and ESA per nephrology  Severe acute on chronic thrombocytopenia: Platelets~100 before 2019> 17 (admit)>> 6>> 12>17> 48.  Unclear etiology but improving.  Hematology consulted earlier in the course.  LDH and haptoglobin unrevealing. -Recommended transfusion for platelets less than 10K or bleeding  A. 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 Left measures 3.5 cm in diameter, R 3.3 cm.  -Outpatient follow-up  ESRD MWF/BMD: CRRT from 2/15-2/21> regular  IHD on 2/26> hypotension >CRRT on 2/28-3/2> IHD -Left IJ HD cath removed on 3/4> IHD MWF. -Plan for CRRT once moved to ICU and HD cath placed.  Left renal mass -Outpatient follow-up and evaluation  Septic shock due to Pyocystitis and Citrobacter bacteremia-s/p bedside bladder aspiration by urology on 2/16.  Completed antibiotic course and resolved.  However, remains hypotensive even with high-dose midodrine and oral Solu-Cortef.  Leukocytosis with bandemia-demargination from steroid?  No fever.  Improved. -Continue monitoring  Acute metabolic encephalopathy-resolved  Debility/generalized weakness/physical deconditioning -PT/OT  Goal of care-very debilitated patient with significant comorbidities as above.  Still full code with full scope of care.  Requiring cortrak  for feeding due to poor p.o. intake. -Palliative care following.  Severe protein calorie malnutrition: Requiring TF through cortrak due to poor p.o. intake.  Cleared by SLP from dysphagia standpoint. -May need PEG if oral intake remains inadequate -Will ask RD to evaluate oral intake-we may have to cut down on tube feed Nutrition Problem: Severe Malnutrition Etiology: chronic illness(ESRD on HD)  Signs/Symptoms: severe muscle depletion, severe fat depletion  Interventions: Tube feeding, Prostat   DVT prophylaxis: SCD in the setting of severe thrombocytopenia Code Status: Full code Family Communication: Updated patient's wife at bedside.  Discharge barrier: Hypotension, fluid overload, lethargy.  Patient is from: Home Final disposition: SNF when medically stable  Consultants:  Nephrology 2/14 Urology 2/16 Hematology  2/22 Palliative care 2/26   Microbiology summarized: 2/14 respiratory viral panel>> negative 2/14 blood cultures x2>> Citrobacter, sensitivities below 2/15 UCx Citrobacter pansensitive  2/25 BCx2 >>ng  Antibiotics summarized Flagyl 2/14 only Cefepime 2/14 > 2/15 Vancomycin 2/14 > 2/15 Meropenem 2/15 > 2/16 Ceftriaxone 2/16 > 2/22 Cefazolin 2/23 > 2/27  Sch Meds:  Scheduled Meds: . amiodarone  200 mg Oral BID  . Chlorhexidine Gluconate Cloth  6 each Topical Q0600  . darbepoetin (ARANESP) injection - NON-DIALYSIS  60 mcg Subcutaneous Q Fri-1800  . famotidine  10 mg Oral Daily  . feeding supplement (PRO-STAT SUGAR FREE 64)  60 mL Per Tube TID  . hydrocortisone  10 mg Oral q AM  . hydrocortisone  20 mg Oral QPM  . mouth rinse  15 mL Mouth Rinse BID  . midodrine  20 mg Oral TID WC  . multivitamin  1 tablet Oral QHS  . sodium chloride flush  10-40 mL Intracatheter Q12H   Continuous Infusions: .  prismasol BGK 4/2.5    .  prismasol BGK 4/2.5    . sodium chloride Stopped (07/02/19 1258)  . sodium chloride Stopped (06/30/19 1303)  . feeding supplement (NEPRO CARB STEADY) 1,000 mL (07/11/19 1805)  . ferric gluconate (FERRLECIT/NULECIT) IV 125 mg (07/11/19 1038)  . prismasol BGK 4/2.5     PRN Meds:.acetaminophen, alteplase, alteplase, heparin, loperamide, ondansetron (ZOFRAN) IV, phenol, sodium chloride,  sodium chloride, sodium chloride flush, traZODone  Antimicrobials: Anti-infectives (From admission, onward)   Start     Dose/Rate Route Frequency Ordered Stop   06/28/19 0930  ceFAZolin (ANCEF) IVPB 1 g/50 mL premix     1 g 100 mL/hr over 30 Minutes Intravenous Every 24 hours 06/28/19 0924 07/02/19 1028   06/21/19 1400  cefTRIAXone (ROCEPHIN) 2 g in sodium chloride 0.9 % 100 mL IVPB  Status:  Discontinued     2 g 200 mL/hr over 30 Minutes Intravenous Every 24 hours 06/21/19 1002 06/28/19 0924   06/20/19 1200  vancomycin (VANCOCIN) IVPB 1000 mg/200 mL premix  Status:   Discontinued     1,000 mg 200 mL/hr over 60 Minutes Intravenous Every M-W-F (Hemodialysis) 06/19/19 0232 06/20/19 1109   06/20/19 1200  meropenem (MERREM) 1 g in sodium chloride 0.9 % 100 mL IVPB  Status:  Discontinued     1 g 200 mL/hr over 30 Minutes Intravenous Every 8 hours 06/20/19 1112 06/21/19 1002   06/19/19 2200  ceFEPIme (MAXIPIME) 1 g in sodium chloride 0.9 % 100 mL IVPB  Status:  Discontinued     1 g 200 mL/hr over 30 Minutes Intravenous Every 24 hours 06/19/19 0232 06/20/19 1109   06/19/19 0230  vancomycin (VANCOREADY) IVPB 750 mg/150 mL     750 mg 150 mL/hr over 60 Minutes Intravenous  Once 06/19/19 0227 06/19/19 0435   06/30/2019 2330  ceFEPIme (MAXIPIME) 2 g in sodium chloride 0.9 % 100 mL IVPB     2 g 200 mL/hr over 30 Minutes Intravenous  Once 06/19/2019 2329 06/19/19 0052   06/13/2019 2330  metroNIDAZOLE (FLAGYL) IVPB 500 mg     500 mg 100 mL/hr over 60 Minutes Intravenous  Once 06/07/2019 2329 06/19/19 0205   06/22/2019 2330  vancomycin (VANCOCIN) IVPB 1000 mg/200 mL premix     1,000 mg 200 mL/hr over 60 Minutes Intravenous  Once 06/22/2019 2329 06/19/19 0205       I have personally reviewed the following labs and images: CBC: Recent Labs  Lab 07/07/19 0951 07/08/19 0325 07/09/19 0748 07/11/19 0715 07/12/19 0546  WBC 17.1* 19.1*  18.6* 13.5* 12.8* 11.7*  NEUTROABS  --  17.3*  --   --   --   HGB 6.9* 8.9*  8.8* 8.5* 8.2* 8.3*  HCT 20.9* 25.8*  25.7* 25.3* 25.4* 26.1*  MCV 88.9 87.5  86.8 88.8 92.4 94.2  PLT 12* 19*  15* 17* 36* 48*   BMP &GFR Recent Labs  Lab 07/06/19 0532 07/06/19 1551 07/07/19 0951 07/08/19 0325 07/09/19 0708 07/11/19 0715 07/12/19 0546  NA 137   < > 136 139 136 134* 137  K 3.4*   < > 3.5 3.5 3.8 3.8 3.7  CL 103   < > 102 102 99 96* 98  CO2 24   < > 23 23 23 22 25   GLUCOSE 106*   < > 106* 125* 105* 133* 104*  BUN 64*   < > 110* 138* 80* 145* 96*  CREATININE 2.44*   < > 3.63* 4.16* 3.06* 4.82* 3.46*  CALCIUM 7.1*   < > 7.3* 7.8*  7.6* 7.9* 8.4*  MG 2.0  --  2.1 2.2 1.9  --  1.9  PHOS 1.6*   < > 2.2* 2.8 3.0 4.6 3.7   < > = values in this interval not displayed.   Estimated Creatinine Clearance: 24.4 mL/min (A) (by C-G formula based on SCr of 3.46 mg/dL (H)). Liver & Pancreas: Recent  Labs  Lab 07/07/19 4270 07/08/19 0325 07/09/19 0708 07/11/19 0715 07/12/19 0546  ALBUMIN 2.0* 2.2* 2.0* 2.0* 2.7*   No results for input(s): LIPASE, AMYLASE in the last 168 hours. No results for input(s): AMMONIA in the last 168 hours. Diabetic: No results for input(s): HGBA1C in the last 72 hours. Recent Labs  Lab 07/11/19 1616 07/11/19 2018 07/12/19 0006 07/12/19 0412 07/12/19 0739  GLUCAP 100* 118* 105* 99 105*   Cardiac Enzymes: No results for input(s): CKTOTAL, CKMB, CKMBINDEX, TROPONINI in the last 168 hours. No results for input(s): PROBNP in the last 8760 hours. Coagulation Profile: No results for input(s): INR, PROTIME in the last 168 hours. Thyroid Function Tests: Recent Labs    07/12/19 0616  TSH 3.020   Lipid Profile: No results for input(s): CHOL, HDL, LDLCALC, TRIG, CHOLHDL, LDLDIRECT in the last 72 hours. Anemia Panel: No results for input(s): VITAMINB12, FOLATE, FERRITIN, TIBC, IRON, RETICCTPCT in the last 72 hours. Urine analysis:    Component Value Date/Time   COLORURINE YELLOW 12/11/2012 1338   APPEARANCEUR TURBID (A) 12/11/2012 1338   LABSPEC 1.014 12/11/2012 1338   PHURINE 8.0 12/11/2012 1338   GLUCOSEU NEGATIVE 12/11/2012 1338   HGBUR MODERATE (A) 12/11/2012 1338   BILIRUBINUR NEGATIVE 12/11/2012 1338   KETONESUR NEGATIVE 12/11/2012 1338   PROTEINUR >300 (A) 12/11/2012 1338   UROBILINOGEN 0.2 12/11/2012 1338   NITRITE NEGATIVE 12/11/2012 1338   LEUKOCYTESUR LARGE (A) 12/11/2012 1338   Sepsis Labs: Invalid input(s): PROCALCITONIN, [place]  Microbiology: No results found for this or any previous visit (from the past 240 hour(s)).  Radiology Studies: DG Chest Port 1  View  Result Date: 07/11/2019 CLINICAL DATA:  Tachypnea. History of congestive heart failure, end-stage renal disease and hypertension. EXAM: PORTABLE CHEST 1 VIEW COMPARISON:  Radiographs 06/25/2019, 06/19/2019 and 10/09/2004. CT 06/19/2019 and 06/30/2019. FINDINGS: 1413 hours. Feeding tube projects below the diaphragm, tip not visualized. There is stable cardiomegaly and aortic atherosclerosis. The pulmonary vasculature is ill-defined, and there are increased diffuse pulmonary opacities bilaterally, greatest in the right upper lobe. There are small bilateral pleural effusions. No pneumothorax or acute osseous findings. IMPRESSION: Worsening bilateral pulmonary opacities and pleural effusions most consistent with worsening congestive heart failure and/or volume overload. Electronically Signed   By: Richardean Sale M.D.   On: 07/11/2019 14:28      Marcquis Ridlon T. [place]  If 7PM-7AM, please contact night-coverage [email or web address] Password [facility] 07/12/2019, 10:31 AM

## 2019-07-12 NOTE — Progress Notes (Signed)
Gregory [name] Progress Note  Subjective: got 950 cc off UF yesterday, pt lethargic this am  Vitals:   07/11/19 1837 07/11/19 2038 07/12/19 0219 07/12/19 0413  BP: (!) 90/53 (!) 101/36 (!) 94/42 (!) 96/45  Pulse: 81 97  91  Resp: (!) 30 20  19   Temp: 99.3 F (37.4 C) 98 F (36.7 C)  98 F (36.7 C)  TempSrc: Oral Oral  Oral  SpO2: 93% 92%  97%  Weight:      Height:        Exam: General: Chronically ill appearing male Heart: regular rhythm, no murmurs, rubs or gallops Lungs: Decreased breath sounds bilateral bases, occ rales bilat Abdomen: Soft, non-tender, non-distended, +BS Extremities: 2-3+ diffuse edema x 4 ext  Dialysis Access: RUE AVF +bruit Neuro - lethargic, responding    Dialysis: [place] MWF 3h 68min 400/800 73kg 2/3.5Ca bath P4 AVF Hep 3300 No VDRA/ESA   Assessment/Plan: 1. ESRD: sp CRRT 2/15-2/21 then again 2/8-3/2 due to septic shock. Intermittent HD resumed 3/5, tolerating well but BP's too low to get vol down. CXR new shows diffuse pulm edema. Needs CRRT. Have d/w pmd, transferring to ICU.  2. Citrobacter sepsis: Seen by urologist and thought to be pyocystis status post Foley drainage and course of antibiotics.  3. HTN/volume:  BP soft on midodrine 20mg  TID, hydrocortisone. Up 20kg + by wts, grossly edematous.  4. Anemia: Hgb 8.2. On aranesp 74mcg weekly. Tsat 12%, started on IV Fe. Transfuse PRN.  5.  Thrombocytopenia - plts up to 43k, no hep w/ CRRT 6. Secondary hyperparathyroidism:  Phos okay , not on binder. Corrected calcium 9.5. Not on VDRA.  7. Nutrition:   Alb 2.0. Renal diet with fluid restriction.  8. Dispo: Remains full scope of care.     Rob Bard Haupert 07/12/2019, 9:59 AM   Recent Labs  Lab 07/11/19 0715 07/12/19 0546  K 3.8 3.7  BUN 145* 96*  CREATININE 4.82* 3.46*  CALCIUM 7.9* 8.4*  PHOS 4.6 3.7  HGB 8.2* 8.3*   Inpatient medications: . amiodarone  200 mg Oral BID  . Chlorhexidine Gluconate Cloth  6 each Topical  Q0600  . darbepoetin (ARANESP) injection - NON-DIALYSIS  60 mcg Subcutaneous Q Fri-1800  . famotidine  10 mg Oral Daily  . feeding supplement (PRO-STAT SUGAR FREE 64)  60 mL Per Tube TID  . hydrocortisone  10 mg Oral q AM  . hydrocortisone  20 mg Oral QPM  . mouth rinse  15 mL Mouth Rinse BID  . midodrine  20 mg Oral TID WC  . multivitamin  1 tablet Oral QHS  . sodium chloride flush  10-40 mL Intracatheter Q12H   . sodium chloride Stopped (07/02/19 1258)  . sodium chloride Stopped (06/30/19 1303)  . feeding supplement (NEPRO CARB STEADY) 1,000 mL (07/11/19 1805)  . ferric gluconate (FERRLECIT/NULECIT) IV 125 mg (07/11/19 1038)   acetaminophen, alteplase, loperamide, ondansetron (ZOFRAN) IV, phenol, sodium chloride, sodium chloride flush, traZODone

## 2019-07-12 NOTE — Progress Notes (Signed)
eLink Physician-Brief Progress Note Patient Name: Gregory [name] DOB: 26-Apr-1951 MRN: [id number]   Date of Service  07/12/2019  HPI/Events of Note  Pt needs order for Fentanyl infusion which is hanging.  eICU Interventions  Fentanyl infusion ordered.        Kerry Kass Delorice Bannister 07/12/2019, 9:19 PM

## 2019-07-12 NOTE — Progress Notes (Addendum)
PCCM Interval Progress Note  69 yo critically ill following cardiac arrest with ROSC. He is on 4 pressors-- NE at 40, Epi at 10, Vaso at 0.04, Neo at 250. He is intubated with copious amounts of clear frothy secretions. CRRT is off due to degree of shock. He has been in unstable Atrial fibrillation with RVR, with intermittent pauses. He has underwent sync cardioversion twice following ROSC. Post-arrest labs remain    I have discussed code status with family. Decision made to continue medical interventions such as pressors and mechanical ventilation, CRRT if/when tolerated. However, if despite this interventions the patient sustains a cardiac arrest, no CPR, no DF, no ACLS medications. We discussed further synchronized cardioversion for unstable atrial fibrillation and this will also not occur moving forward.   Unstable atrial fibrillation -Patient has undergone synchronized cardioversion for unstable atrial fibrillation for a total of 2 times, at 120 J. Attempted slow amiodarone bolus however this was aborted due to worsening hypotension.  P -no further cardioversion per conversation with family  -continue to assess for  ? Amio, too unstable at this time  -2 g mag, Cal glu empirically given -goal mag > 2, K 4  -Follow up labs   Shock  -s/p cardiac arrest of unclear provocation  -post arrest/intubation CXR without obvious PTX although area on L Lung apex somewhat difficult to discern  P - continue pressors. Will ask RN to turn vaso back to 0.03 from 0.04. Next try to slowly wean Epi although this may not be well tolerated.  -follow up CXR read.  -not candidate for TTM -too unstable for CT H at present   ESRD on CRRT -held due to refractory shock state -need to see if/when stable to restart. Wonder if we are approaching end of reasonable medical efforts  Acute respiratory failure -cardiac arrest -pulm edema P -unable to toelrate CRRT at this time -cont MV support  GOC -Partial code:  no CPR, DF, CV -if further decline, re-engage family about discussions for comfort care.  -appreciate spiritual support    Additional critical care time: 57 minutes  Gregory Gum MSN, AGACNP-BC [place] [phone] If no answer, [phone] 07/12/2019, 6:44 PM   Pulmonary critical care attending:  Patient seen and examined postarrest.  Ongoing discussions with family.  Met with patient's family and chaplain to discuss prognosis.  Uptitrated vasopressors for shocklike state. Patient has flail chest on exam post arrest.  has rib fractures.  Progressive shocklike state unable to tolerate CRRT at this time.  Currently maxed on 4 vasopressors.  Agree with limited CODE STATUS and no additional chest compressions.  This patient is critically ill with multiple organ system failure; which, requires frequent high complexity decision making, assessment, support, evaluation, and titration of therapies. This was completed through the application of advanced monitoring technologies and extensive interpretation of multiple databases. During this encounter critical care time was devoted to patient care services described in this note for 32 minutes.

## 2019-07-12 NOTE — Procedures (Signed)
Arterial Catheter Insertion Procedure Note Gregory [name] [id number] 1951/02/26  Procedure: Insertion of Arterial Catheter  Indications: Blood pressure monitoring and Frequent blood sampling  Procedure Details Consent: Unable to obtain consent because of emergent medical necessity. Time Out: Verified patient identification, verified procedure, site/side was marked, verified correct patient position, special equipment/implants available, medications/allergies/relevent history reviewed, required imaging and test results available.  Performed  Maximum sterile technique was used including antiseptics, cap, gloves, gown, hand hygiene, mask and sheet. Skin prep: Chlorhexidine; local anesthetic administered 20 gauge catheter was inserted into left radial artery using the Seldinger technique. ULTRASOUND GUIDANCE USED: NO Evaluation Blood flow good; BP tracing good. Complications: No apparent complications.   Jesse Sans 07/12/2019

## 2019-07-12 NOTE — Procedures (Signed)
Intubation Procedure Note Gregory [name] [id number] 1950/09/14  Procedure: Intubation Indications: Airway protection and maintenance  Procedure Details Consent: Unable to obtain consent because of emergent medical necessity. Time Out: Verified patient identification, verified procedure, site/side was marked, verified correct patient position, special equipment/implants available, medications/allergies/relevent history reviewed, required imaging and test results available.  Performed  Maximum sterile technique was used including cap, gloves, gown, hand hygiene and mask.  MAC video laryngoscope, blade 3 7.5 ET tube Patient intubated in between chest compressions during CPR. Grade 1 view of cords   Evaluation Hemodynamic Status: Persistent hypotension treated with pressors and fluid; O2 sats: stable throughout and currently acceptable Patient's Current Condition: unstable Complications: No apparent complications Patient did tolerate procedure well. Chest X-ray ordered to verify placement.  CXR: pending.   Gregory [name] 07/12/2019

## 2019-07-12 NOTE — Progress Notes (Addendum)
Code Blue note Pt had witnessed episode of unresponsiveness while this RN was in the room. Pt started to mumble, then looked to the left and HR precipitously dropped from 110s to asystole.  No carotid pulse was palpated, CPR was initiated at 1637  1637 CPR initiated and 1 mg Epi given 1640 2nd 1 mg dose Epi  After pulse check showed PEA 1641 Pt intubated by Dr. Valeta Harms  1644 3rd 1 mg dose Epi 1645 1 mg Calcium given 1646 ROSC achieved  1701 2 gr Magnesium hung IVPB 1702 pt cardioverted with 120 J for HR sustaining in 150s 1741 2nd cardioversion at 120 J performed followed by 200 mg Amio push  GOC discussion with wife Mickel Baas, pt now no code blue, intubation only

## 2019-07-12 NOTE — Progress Notes (Signed)
Responded to Code blue.  Met wife outside of the room and attended her through most of the cardiac event, offering a ministry of presence and prayers.  Attended wife until some of her family arrived and together they decided on the next level of care for Mr. Klemens.  Brother-in-law was a comfort not only as family member but also as Theme park manager, so Chaplain departed.  De Burrs Chaplain Resident

## 2019-07-12 NOTE — Progress Notes (Addendum)
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  He was transferred out of ICU and to [facility] on 2/23.  Called 2/25 for recurrent hypotension, not responsive to fluids/ albumin therefore was transferred back to ICU for vasopressor support.     Past Medical History   has a past medical history of Anemia in chronic kidney disease, Arthritis, CHF (congestive heart failure) ([place]), Chronic back pain, End stage renal disease ([place]), GERD (gastroesophageal reflux disease), Gout, Heart failure with reduced ejection fraction ([place]), and Hypertension. celiac and superior mesenteric artery aneurysms  Significant Hospital Events   2/14 Admit to [facility] 2/14 He developed SVT, was hemodynamically unstable, treated initially with adenosine but ultimately required DCCV around 715. Has remained hypotensive, received albumin and now another 2 L IV fluid resuscitation (5 L total). Interacting but confused, still not back to baseline per wife at bedside 2/15 Stool-like produced from in-out urinary cath 2/16 Urology consult, confirmed pyocystitis and drained bladder 2/22 out of ICU to [facility] on 2/23 2/25 PCCM re-consult given hypotension 2/26 Remains on vasopressors support  2/28 CRRT initiated 3/1 Neo at 150 mcg/min , plts 6K >> transfused 3/4 back on TRH, out of ICU 3/9 back to ICU for CRRT per nephrology   Consults:  Nephrology 2/14 Urology 2/16 Hematology 2/22 Palliative care 2/26  PCCM 3/9  Procedures:  2/14 left IJ HD cath  insertion >3/5 2/16 Bedside bladder aspiration 2/25 foley >> 2/26 (no output or pus noted)  Significant Diagnostic Tests:  2/14 CXR>>Cardiomegaly.  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 Chronic elevation of the left hemidiaphragm with chronic volume loss at the left lung base.  2/14 CT Abd 1. No acute pulmonary embolism. 2. Small bilateral pleural effusions with adjacent atelectasis. 3. Dilated main pulmonary artery, which may be secondary to pulmonary arterial hypertension. 4. Interval development of wedge-shaped defects involving the spleen, which may represent splenic infarcts. 5. Cholelithiasis with mild gallbladder wall thickening and pericholecystic free fluid, which may be secondary to underlying liver disease. If there is clinical concern for acute cholecystitis, follow-up with ultrasound is recommended. 6. 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 7. 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 8. Additional chronic findings as detailed above. 9. Aortic Atherosclerosis (ICD10-I70.0).  2/16 CT Pelvis with Rectal contrast 1. Negative for colovesicular fistula. Rectal contrast only opacifies the colon. 2. Bladder wall thickening likely from chronic outlet obstruction. The prostate is enlarged. 3. Prominent anasarca.  CT Head 2/14 No acute intracranial finding. Atrophy. Chronic small-vessel ischemic changes. 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  Echo 2/14 . Left ventricular ejection fraction, by estimation, is 60 to 65%. The  left ventricle has normal function. The left ventricle has no regional  wall motion abnormalities. Left ventricular diastolic parameters are  indeterminate.   2. Right ventricular systolic function is normal. The  right ventricular  size is normal.  3. Left atrial size was severely dilated.   4. Right atrial size was mildly dilated.   5. The mitral valve is degenerative. Mild mitral valve regurgitation. No  evidence of mitral stenosis.   6. The aortic valve is tricuspid. Aortic valve regurgitation is mild to  moderate. Mild aortic valve stenosis.   7. The inferior vena cava is normal in size with greater than 50%  respiratory variability, suggesting right atrial pressure of 3 mmHg.   RUQ U/S 2/15 1. Both the gallbladder and the CBD appear mildly distended with sludge but no stone identified. No intrahepatic biliary ductal dilatation to strongly suggest acute bile duct obstruction. Some gallbladder wall thickening, although no sonographic Murphy sign elicited to strongly suggest acalculous cholecystitis. 2. If abdominal pain continues then a nuclear medicine hepatobiliary scan would be valuable to exclude acute CBD or cystic duct Obstruction.  2/25 BLE DVT US >> neg 2/25 LUE DVT US >> neg  2/25 CT abd/ pelvis >> 1. Small bilateral pleural effusions. 2. Cardiomegaly with diffuse atherosclerosis of the coronary vessels. 3. Cholelithiasis without cholecystitis. 4. Bilateral renal atrophy consistent with end-stage renal disease. Stable indeterminate 1.2 cm lesion left kidney. 5. High attenuation material within the bladder lumen may be related to excretion of previously administered contrast. Please correlate with urinalysis. 6. Trace simple appearing pelvic free fluid. 7. Stable endoluminal stent graft within the aorta and bilateral common iliac arteries. Evaluation of the vascular lumen is limited without contrast. 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  Micro Data:  2/14 respiratory viral panel>> negative 2/14 blood cultures x2>> Citrobacter, sensitivities below 2/15 UCx Citrobacter pansensitive 2/25 BCx2 >>NGTD  Antimicrobials:  Flagyl 2/14 only Cefepime 2/14 >  2/15 Vancomycin 2/14 > 2/15 Meropenem 2/15 > 2/16 Ceftriaxone 2/16 > 2/22 Cefazolin 2/23 > 2/27  Interim history/subjective:  Hypotensive during iHD yesterday although not hypotensive now during my exam  On RA, SpO2 96%   Objective   Blood pressure (!) 96/45, pulse 91, temperature 98 F (36.7 C), temperature source Oral, resp. rate 19, height 6\' 4"  (1.93 m), weight 98.9 kg, SpO2 97 %.        Intake/Output Summary (Last 24 hours) at 07/12/2019 1118 Last data filed at 07/12/2019 0930 Gross per 24 hour  Intake 10 ml  Output 800 ml  Net -790 ml   Filed Weights   07/10/19 0500 07/11/19 0700 07/11/19 1038  Weight: 100.2 kg 101.2 kg 98.9 kg   General:  Chronically ill, frail elderly male sitting in bed in CRRT in NAD HEENT: MM pink/moist, no JVD, left IJ dialysis catheter Neuro: Awake, appropriate, pleasant today, MAE , appears weak and deconditioned CV: rr, no rub or murmur PULM: diminished bibasilar sounds.  GI: soft flat ndnt. + bowel sounds  Extremities: decreased muscle bulk BUE BLE, symmetrical. No cyanosis or clubbing  Skin: c/d/w without rash  Resolved Hospital Problem list   Septic Shock 2/2 to Citrobacter bacteremia  Acute Metabolic encephalopathy Pyocystitis -resolved, no evidence of colovesical fistula Hypokalemia Hypophosphatemia  Assessment & Plan:   ESRD on m/w/f iHD P: Nephrology following, rec transfer to ICU for CRRT Will plan to place iHD cath in ICU. Wonder if patient would benefit from permcath placement moving forward?   Pulmonary Edema -seen on CXR 3/8; then had iHD -Not hypoxic at present 3/9, no respiratory sx.  P: Will check CXR 3/9 after iHD cath placement, will be able to eval for edema then  Can check CXR 3/10 AM if  needed as well   Hypotension -component of chronic hypotnesion, on midodrine  -has had acuate intermittent hypotension inpatient, difficulty pulling fluid during iHD -doubt acutely related to sepsis P: Continue midodrine  TID Continue BID cortef  Hope to avoid pressors-- he is not hypotensive at time of my exam 3/9   Severe thrombocytopenia Normocytic anemia P:  Transfuse plt if < 10; currently no transfusion indication  IV iron, ESA per neprhology  Afib - new onset inpatient, CHADSVASC2 score 3 - not candidate for anticoag with severe thrombocytopenia P:  PO amio Continue tele monitoring in ICU   Protein calorie malnutrition, dysphagia Immobility  Deconditioning  P:  EN via cortrak, may need PEG ultimately Holding acutely 3/9 for iHD cath placement, will need to be flat and pt at risk for aspiration  Palliative care has been consulted, appreciate continuing to follow   Will need ongoing PT/OT, SNF vs CIR when stabilized further  Celiac and superior mesenteric artery aneurysms -On CT scanning 05/2019 aneurysmal dilatation of bilateral common iliac arteries again noted. Left measures 3.5 cm in diameter, R 3.3 cm.  P:  OP vascular follow up   Left kidney mass P:  Will need OP follow up   Best practice:  Diet: Holding EN as will need to lay flat for iHD cath placement  Pain/Anxiety/Delirium protocol (if indicated): N/A VAP protocol (if indicated): N/A DVT prophylaxis: SCDs GI prophylaxis: PPI Glucose control: CBG q 4  Mobility: PT/OR Code Status: Full Family Communication: Patient updated Disposition: Transferring back to ICU for CRRT per nephro    [name] MSN, AGACNP-BC [place] [phone] If no answer, [phone] 07/12/2019, 11:18 AM

## 2019-07-12 NOTE — Progress Notes (Signed)
PCCM attending:  I was on the floor evaluating another person and paged urgently to room 25.  Patient upon presentation to the room was in cardiac arrest chest compressions have already started.  Per nursing patient was awake following commands looked over to the side and became unresponsive.  Initial rhythm was PEA.  Patient received CPR.  With 5 rounds of epinephrine.  Patient was also given calcium and bicarb.  Patient was urgently endotracheally intubated with glide scope. 7.5 ett with CO2 color change.  Postarrest patient was in atrial fibrillation with RVR and severely hypotensive.  Heart rate 170.  Decision was made for DC cardioversion 120 J.  Garner Nash, DO [facility] 07/12/2019 5:39 PM

## 2019-07-12 NOTE — Progress Notes (Signed)
Physical Therapy Treatment Patient Details Name: Gregory [name] MRN: [id number] DOB: [birth year] Today's Date: 07/12/2019    History of Present Illness Pt is a 69 y/o male presenting with two days of lethargy and confusion/AMS. CTH negative, negative for PE, covid negative at admission. Admitted for sepsis workup, encephalopathy. Transferred to ICU 2/25 for hypotension requiring pressors. CRRT 2/28-3/2.  PMH ESRD on HD, anemia, CHF, HTN, celiac and mesenteric artery aneurysms, iliac artery aneurysm s/p graft repair.    PT Comments    Pt lethargic on arrival will open eyes for brief periods. Pt able to state name, that he has 2 kids but would not respond to grandchildren. Pt able to follow command for very weak grip and grabbing washcloth but very delayed with responses. Pt able to sit EOB with progression from max to min assist sitting balance with flexed neck and trunk with posterior lean if neck extended. Pt with continual lack of progression and activity tolerance with goals downgraded. Pt does state he wants to be able to move and felt better after getting up but at this time requires total lift for OOB.     Follow Up Recommendations  SNF     Equipment Recommendations  Hospital bed    Recommendations for Other Services       Precautions / Restrictions Precautions Precautions: Fall Precaution Comments: Cortrak, flexiseal Restrictions Weight Bearing Restrictions: No    Mobility  Bed Mobility Overal bed mobility: Needs Assistance Bed Mobility: Supine to Sit;Sit to Supine     Supine to sit: Total assist;+2 for physical assistance Sit to supine: Total assist;+2 for physical assistance   General bed mobility comments: patient requires total assist to transition to/from EOB, pt with no initation of  tasks   Transfers Overall transfer level: Needs assistance Equipment used: 2 person hand held assist Transfers: Sit to/from Stand Sit to Stand: Total assist;+2 physical  assistance;From elevated surface;+2 safety/equipment         General transfer comment: pt agreeable to attempt stand at EOB, requires total assist for partial stand with no activation or initation noted  Ambulation/Gait                 Stairs             Wheelchair Mobility    Modified Rankin (Stroke Patients Only)       Balance Overall balance assessment: Needs assistance Sitting-balance support: No upper extremity supported;Feet supported Sitting balance-Leahy Scale: Poor Sitting balance - Comments: initally requiring max assist at EOB progressing to minA/min guard at times; with head extension noted posterior lean    Standing balance support: Bilateral upper extremity supported Standing balance-Leahy Scale: Zero                              Cognition Arousal/Alertness: Lethargic Behavior During Therapy: Flat affect Overall Cognitive Status: Impaired/Different from baseline Area of Impairment: Attention;Memory;Safety/judgement;Following commands;Awareness;Problem solving                   Current Attention Level: Focused Memory: Decreased recall of precautions;Decreased short-term memory Following Commands: Follows one step commands inconsistently;Follows one step commands with increased time Safety/Judgement: Decreased awareness of safety;Decreased awareness of deficits Awareness: Intellectual Problem Solving: Slow processing;Decreased initiation;Difficulty sequencing;Requires verbal cues;Requires tactile cues General Comments: patient lethargic and opening eyes intermittently during session, follows simple commands with increased time but inconsistently and requires multimodal cueing; poor initation and sequencing, attention to task  Exercises General Exercises - Lower Extremity Long Arc Quad: AAROM;Both;10 reps;Seated Hip Flexion/Marching: AAROM;Both;10 reps;Seated Other Exercises Other Exercises: attempted functional  reaching grasp/release with washcloth from therapist/between R and L hands with max assist required    General Comments General comments (skin integrity, edema, etc.): HR 115-123, SpO2 93 or greater on RA       Pertinent Vitals/Pain Pain Assessment: Faces Faces Pain Scale: Hurts little more Pain Location: generalized  Pain Descriptors / Indicators: Discomfort;Grimacing Pain Intervention(s): Repositioned;Limited activity within patient's tolerance;Monitored during session    Home Living                      Prior Function            PT Goals (current goals can now be found in the care plan section) Acute Rehab PT Goals Time For Goal Achievement: 07/26/19 Potential to Achieve Goals: Poor Progress towards PT goals: Not progressing toward goals - comment;Goals downgraded-see care plan    Frequency    Min 2X/week      PT Plan Current plan remains appropriate    Co-evaluation PT/OT/SLP Co-Evaluation/Treatment: Yes Reason for Co-Treatment: Complexity of the patient's impairments (multi-system involvement);For patient/therapist safety PT goals addressed during session: Mobility/safety with mobility;Balance;Strengthening/ROM OT goals addressed during session: ADL's and self-care      AM-PAC PT "6 Clicks" Mobility   Outcome Measure  Help needed turning from your back to your side while in a flat bed without using bedrails?: Total Help needed moving from lying on your back to sitting on the side of a flat bed without using bedrails?: Total Help needed moving to and from a bed to a chair (including a wheelchair)?: Total Help needed standing up from a chair using your arms (e.g., wheelchair or bedside chair)?: Total Help needed to walk in hospital room?: Total Help needed climbing 3-5 steps with a railing? : Total 6 Click Score: 6    End of Session   Activity Tolerance: Patient limited by lethargy Patient left: in bed;with call bell/phone within reach;with bed  alarm set Nurse Communication: Mobility status PT Visit Diagnosis: Muscle weakness (generalized) (M62.81);Other abnormalities of gait and mobility (R26.89);Difficulty in walking, not elsewhere classified (R26.2)     Time: 1194-1740 PT Time Calculation (min) (ACUTE ONLY): 29 min  Charges:  $Therapeutic Activity: 8-22 mins                     Saulo Anthis P, PT Acute Rehabilitation Services Pager: [phone] Office: [place] 07/12/2019, 1:43 PM

## 2019-07-12 NOTE — Progress Notes (Signed)
Occupational Therapy Treatment Patient Details Name: Gregory [name] MRN: [id number] DOB: 1951-04-26 Today's Date: 07/12/2019    History of present illness Pt is a 69 y/o male presenting with two days of lethargy and confusion/AMS. CTH negative, negative for PE, covid negative at admission. Admitted for sepsis workup, encephalopathy. Transferred to ICU 2/25 for hypotension requiring pressors. CRRT 2/28-3/2.  PMH ESRD on HD, anemia, CHF, HTN, celiac and mesenteric artery aneurysms, iliac artery aneurysm s/p graft repair.   OT comments  Patient seen for OT/PT session.  Continues to be limited by lethargy, cognition, weakness and impaired balance. Patient requires total assist for bed mobility, max fading to min assist/min guard for static sitting EOB (noted posterior lean with assisted head extension), and total assist for ADLs at this time.  Patient with increased verbalizations today, intermittently opening eyes but requires cueing.  Follows 1 step commands with increased time/multimodal cueing inconsistently.  Will follow acutely. DC plan remains appropriate.     Follow Up Recommendations  SNF;Supervision/Assistance - 24 hour    Equipment Recommendations  Other (comment)(TBD at next venue of care)    Recommendations for Other Services      Precautions / Restrictions Precautions Precautions: Fall Precaution Comments: Cortrak, rectal tube Restrictions Weight Bearing Restrictions: No       Mobility Bed Mobility Overal bed mobility: Needs Assistance Bed Mobility: Supine to Sit;Sit to Supine     Supine to sit: Total assist;+2 for physical assistance Sit to supine: Total assist;+2 for physical assistance   General bed mobility comments: patient requires total assist to transition to/from EOB, pt with no initation of  tasks   Transfers Overall transfer level: Needs assistance Equipment used: 2 person hand held assist Transfers: Sit to/from Stand Sit to Stand: Total assist;+2  physical assistance;From elevated surface;+2 safety/equipment(did not acheive full stand )         General transfer comment: pt agreeable to attempt stand at EOB, requires total assist for partial stand with no activation or initation noted    Balance Overall balance assessment: Needs assistance Sitting-balance support: No upper extremity supported;Feet supported Sitting balance-Leahy Scale: Poor Sitting balance - Comments: initally requiring max assist at EOB progressing to minA/min guard at times; with head extension noted posterior lean                                    ADL either performed or assessed with clinical judgement   ADL Overall ADL's : Needs assistance/impaired     Grooming: Wash/dry face;Total assistance;Bed level Grooming Details (indicate cue type and reason): hand over hand support (R UE) to wash face, no intation to move UEs but verbalizing the washcloth was cold today             Lower Body Dressing: Total assistance;Bed level Lower Body Dressing Details (indicate cue type and reason): to don socks   Toilet Transfer Details (indicate cue type and reason): deferred         Functional mobility during ADLs: Total assistance;+2 for physical assistance;+2 for safety/equipment General ADL Comments: limited to EOB, requires total assist for all self care at this time but able to progress to sitting EOB with min assist/min guard at times      Vision       Perception     Praxis      Cognition Arousal/Alertness: Lethargic Behavior During Therapy: Flat affect Overall Cognitive Status: Impaired/Different from baseline  Area of Impairment: Attention;Memory;Safety/judgement;Following commands;Awareness;Problem solving                   Current Attention Level: Focused Memory: Decreased recall of precautions;Decreased short-term memory Following Commands: Follows one step commands inconsistently;Follows one step commands with  increased time Safety/Judgement: Decreased awareness of safety;Decreased awareness of deficits Awareness: Intellectual Problem Solving: Slow processing;Decreased initiation;Difficulty sequencing;Requires verbal cues;Requires tactile cues General Comments: patient lethargic and opening eyes intermittently during session, follows simple commands with increased time but inconsistently and requires multimodal cueing; poor initation and sequencing, attention to task         Exercises Exercises: Other exercises Other Exercises Other Exercises: attempted functional reaching grasp/release with washcloth from therapist/between R and L hands with max assist required   Shoulder Instructions       General Comments HR 115-123, SpO2 93 or greater on RA     Pertinent Vitals/ Pain       Pain Assessment: Faces Faces Pain Scale: Hurts little more Pain Location: generalized  Pain Descriptors / Indicators: Discomfort;Grimacing Pain Intervention(s): Monitored during session;Repositioned;Limited activity within patient's tolerance  Home Living                                          Prior Functioning/Environment              Frequency  Min 2X/week        Progress Toward Goals  OT Goals(current goals can now be found in the care plan section)  Progress towards OT goals: Progressing toward goals     Plan Discharge plan remains appropriate;Frequency remains appropriate    Co-evaluation    PT/OT/SLP Co-Evaluation/Treatment: Yes Reason for Co-Treatment: Complexity of the patient's impairments (multi-system involvement);Necessary to address cognition/behavior during functional activity;For patient/therapist safety;To address functional/ADL transfers   OT goals addressed during session: ADL's and self-care      AM-PAC OT "6 Clicks" Daily Activity     Outcome Measure   Help from another person eating meals?: Total Help from another person taking care of personal  grooming?: Total Help from another person toileting, which includes using toliet, bedpan, or urinal?: Total Help from another person bathing (including washing, rinsing, drying)?: Total Help from another person to put on and taking off regular upper body clothing?: Total Help from another person to put on and taking off regular lower body clothing?: Total 6 Click Score: 6    End of Session    OT Visit Diagnosis: Other abnormalities of gait and mobility (R26.89);Muscle weakness (generalized) (M62.81);Other symptoms and signs involving cognitive function   Activity Tolerance Patient limited by lethargy   Patient Left in bed;with call bell/phone within reach;with bed alarm set;with nursing/sitter in room;with SCD's reapplied   Nurse Communication Mobility status;Precautions        Time: 6759-1638 OT Time Calculation (min): 32 min  Charges: OT General Charges $OT Visit: 1 Visit OT Treatments $Self Care/Home Management : 8-22 mins  Jolaine Artist, OT [place] Pager [phone] Office [phone]    Delight Stare 07/12/2019, 10:11 AM

## 2019-07-12 NOTE — Significant Event (Signed)
Rapid Response Event Note  I received a call from bed placement with concerns of patient needing an ICU bed. I went to 6N27 and saw the patient. Per nurse and chart, patient will need to go to the ICU for CRRT per Nephrology. Upon arrival, patient was lethargic, woke to voice my voice, able to answer my questions and follow simple commands. + JVD and + 3 edema in all extremities. SBP mid 90s, MAP > 60, 95% on RA, HR 110-115. Breathing comfortably.   Plan: -- RNs to transfer to ICU when bed is ready.   Call RRRN if needed    Dacen Frayre R

## 2019-07-12 NOTE — Progress Notes (Signed)
Nutrition Follow-up  DOCUMENTATION CODES:   Severe malnutrition in context of chronic illness  INTERVENTION:   -D/c Nepro due to initiation of CRRT with hypotensive episodes  -Vital 1.5 @ 55 ml/hr via Cortrak tube (1320 ml/day) 60 ml Prostat TID -Renavite daily   Tube feeding regimen will provide 2580 kcals, 179 grams of protein, 1008 ml free water   -If pt continues to desire aggressive care, consider permanent feeding access (ex PEG). Pt has been dependent on cortrak tube since 07/01/19.   NUTRITION DIAGNOSIS:   Severe Malnutrition related to chronic illness(ESRD on HD) as evidenced by severe muscle depletion, severe fat depletion.  Ongoing  GOAL:   Patient will meet greater than or equal to 90% of their needs  Met with TF  MONITOR:   TF tolerance, PO intake, Supplement acceptance  REASON FOR ASSESSMENT:   Consult Assessment of nutrition requirement/status  ASSESSMENT:   69 yo male admitted with septic shock from fulminant cystitis. PMH includes HTN, CHF, HF, GERD, ESRD on HD, anemia.  2/15-2/21/21 CRRT  2/23 unable to get cortrak due to platelets of 9 2/24 pt upgraded to DYS3/thins diet 2/28 transition back to CRRT due to hypotension. Pt on quad strength neo due to volume. 3/2 off CRRT   Reviewed I/O's: -1.7 L x 24 hours and +9.2 L since 06/28/19  Stool output: 800 ml x 24 hours  RD re-consulted to re-evaluate nutritional status. Per chart review, MD requesting re-evaluating needs for cortrak tube. Per SLP note, pt is swallowing adequately and no need for further swallow studies at this time.   Case discussed with RD who had been following pt closely prior to transfer to 6[facility], who reports that pt has been requiring cortrak/ TF due to very limited oral intake.   Pt sitting in bed at time of visit. He is very soft-spoken and responds mainly to close ended questions. Observed breakfast tray on counter, which was unattempted. Pt states that he is not hungry and  does not feel like eating. He reports he ate a little bit yesterday, but does not remember what he ate (however, documented meal intake 0% for breakfast, lunch, and dinner on 07/11/19).  Pt denies any nausea, vomiting, abdominal pain, or generalized discomfort for cortrak feeding tube. Discussed with pt rationale for continued use of cortrak; he expressed understanding. Pt has been dependent on cortrak since 07/01/19; pt would benefit from more permanent feeding acess (ex PEG) if he continues to desire aggressive care.   Calorie count initiated, but d/c secondary to rapid response. Pt experienced hypotensive episodes after D visit and was transferred to ICU for initiation of CRRT.  Current TF regimen of Nepro @ 50 ml/hr and 60 ml Prostat TID provides 2640 kcals and 142 grams protein, meeting 100% of estimated kcal and protein needs.   Labs reviewed: CBGS: 99-108  NUTRITION - FOCUSED PHYSICAL EXAM:    Most Recent Value  Orbital Region  Severe depletion  Upper Arm Region  Severe depletion  Thoracic and Lumbar Region  Moderate depletion  Buccal Region  Severe depletion  Temple Region  Severe depletion  Clavicle Bone Region  Severe depletion  Clavicle and Acromion Bone Region  Severe depletion  Scapular Bone Region  Severe depletion  Dorsal Hand  Moderate depletion  Patellar Region  Moderate depletion  Anterior Thigh Region  Moderate depletion  Posterior Calf Region  Moderate depletion  Edema (RD Assessment)  Mild  Hair  Reviewed  Eyes  Reviewed  Mouth  Reviewed  Skin  Reviewed  Nails  Reviewed       Diet Order:   Diet Order            Diet renal with fluid restriction Fluid restriction: 1200 mL Fluid; Room service appropriate? Yes; Fluid consistency: Thin  Diet effective now              EDUCATION NEEDS:   Not appropriate for education at this time  Skin:  Skin Assessment: Reviewed RN Assessment  Last BM:  07/12/19 (800 ml via rectal tube)  Height:   Ht Readings from  Last 1 Encounters:  06/19/19 _0  (1.93 m)    Weight:   Wt Readings from Last 1 Encounters:  07/11/19 98.9 kg    Ideal Body Weight:  91.8 kg  BMI:  Body mass index is 26.54 kg/m.  Estimated Nutritional Needs:   Kcal:  2400-2600  Protein:  140-160 gm  Fluid:  1 L    Loistine Chance, RD, LDN, [place] Registered Dietitian II Certified Diabetes Care and Education Specialist Please refer to [facility] for RD and/or RD on-call/weekend/after hours pager

## 2019-07-13 ENCOUNTER — Inpatient Hospital Stay (HOSPITAL_COMMUNITY): Payer: Medicare Other

## 2019-07-13 DIAGNOSIS — J9601 Acute respiratory failure with hypoxia: Secondary | ICD-10-CM

## 2019-07-13 DIAGNOSIS — Z7189 Other specified counseling: Secondary | ICD-10-CM

## 2019-07-13 DIAGNOSIS — Z992 Dependence on renal dialysis: Secondary | ICD-10-CM

## 2019-07-13 DIAGNOSIS — N186 End stage renal disease: Secondary | ICD-10-CM

## 2019-07-13 LAB — GLUCOSE, CAPILLARY
Glucose-Capillary: 104 mg/dL — ABNORMAL HIGH (ref 70–99)
Glucose-Capillary: 117 mg/dL — ABNORMAL HIGH (ref 70–99)
Glucose-Capillary: 129 mg/dL — ABNORMAL HIGH (ref 70–99)
Glucose-Capillary: 91 mg/dL (ref 70–99)
Glucose-Capillary: 97 mg/dL (ref 70–99)
Glucose-Capillary: 97 mg/dL (ref 70–99)

## 2019-07-13 LAB — MRSA PCR SCREENING: MRSA by PCR: NEGATIVE

## 2019-07-13 LAB — COMPREHENSIVE METABOLIC PANEL
ALT: 2142 U/L — ABNORMAL HIGH (ref 0–44)
AST: 7169 U/L — ABNORMAL HIGH (ref 15–41)
Albumin: 1.9 g/dL — ABNORMAL LOW (ref 3.5–5.0)
Alkaline Phosphatase: 110 U/L (ref 38–126)
Anion gap: 20 — ABNORMAL HIGH (ref 5–15)
BUN: 109 mg/dL — ABNORMAL HIGH (ref 8–23)
CO2: 14 mmol/L — ABNORMAL LOW (ref 22–32)
Calcium: 8.2 mg/dL — ABNORMAL LOW (ref 8.9–10.3)
Chloride: 104 mmol/L (ref 98–111)
Creatinine, Ser: 3.91 mg/dL — ABNORMAL HIGH (ref 0.61–1.24)
GFR calc Af Amer: 17 mL/min — ABNORMAL LOW (ref >60)
GFR calc non Af Amer: 15 mL/min — ABNORMAL LOW (ref >60)
Glucose, Bld: 159 mg/dL — ABNORMAL HIGH (ref 70–99)
Potassium: 3.8 mmol/L (ref 3.5–5.1)
Sodium: 138 mmol/L (ref 135–145)
Total Bilirubin: 1.8 mg/dL — ABNORMAL HIGH (ref 0.3–1.2)
Total Protein: 4.1 g/dL — ABNORMAL LOW (ref 6.5–8.1)

## 2019-07-13 LAB — CBC
HCT: 30.4 % — ABNORMAL LOW (ref 39.0–52.0)
Hemoglobin: 9.5 g/dL — ABNORMAL LOW (ref 13.0–17.0)
MCH: 30.3 pg (ref 26.0–34.0)
MCHC: 31.3 g/dL (ref 30.0–36.0)
MCV: 96.8 fL (ref 80.0–100.0)
Platelets: 43 10*3/uL — ABNORMAL LOW (ref 150–400)
RBC: 3.14 MIL/uL — ABNORMAL LOW (ref 4.22–5.81)
RDW: 17.9 % — ABNORMAL HIGH (ref 11.5–15.5)
WBC: 31 10*3/uL — ABNORMAL HIGH (ref 4.0–10.5)
nRBC: 0.2 % (ref 0.0–0.2)

## 2019-07-13 LAB — HEMOGLOBIN AND HEMATOCRIT, BLOOD
HCT: 19.4 % — ABNORMAL LOW (ref 39.0–52.0)
Hemoglobin: 5.9 g/dL — CL (ref 13.0–17.0)

## 2019-07-13 LAB — MAGNESIUM: Magnesium: 2.3 mg/dL (ref 1.7–2.4)

## 2019-07-13 LAB — LACTIC ACID, PLASMA: Lactic Acid, Venous: 7 mmol/L (ref 0.5–1.9)

## 2019-07-13 MED ORDER — B COMPLEX-C PO TABS
1.0000 | ORAL_TABLET | Freq: Every day | ORAL | Status: DC
  Administered 2019-07-13 – 2019-07-15 (×3): 1 via ORAL
  Filled 2019-07-13 (×3): qty 1

## 2019-07-13 MED ORDER — VANCOMYCIN HCL 1750 MG/350ML IV SOLN
1750.0000 mg | Freq: Once | INTRAVENOUS | Status: AC
  Administered 2019-07-13: 1750 mg via INTRAVENOUS
  Filled 2019-07-13: qty 350

## 2019-07-13 MED ORDER — VITAL 1.5 CAL PO LIQD
1000.0000 mL | ORAL | Status: DC
  Administered 2019-07-14: 1000 mL
  Filled 2019-07-13 (×3): qty 1000

## 2019-07-13 MED ORDER — HYDROCORTISONE 20 MG PO TABS
20.0000 mg | ORAL_TABLET | Freq: Every evening | ORAL | Status: DC
  Administered 2019-07-13: 20 mg
  Filled 2019-07-13: qty 1

## 2019-07-13 MED ORDER — SODIUM CHLORIDE 0.9 % IV SOLN
2.0000 g | INTRAVENOUS | Status: DC
  Administered 2019-07-14 – 2019-07-15 (×2): 2 g via INTRAVENOUS
  Filled 2019-07-13 (×2): qty 2

## 2019-07-13 MED ORDER — VANCOMYCIN VARIABLE DOSE PER UNSTABLE RENAL FUNCTION (PHARMACIST DOSING): Status: DC

## 2019-07-13 MED ORDER — SODIUM CHLORIDE 0.9 % IV SOLN
2.0000 g | Freq: Two times a day (BID) | INTRAVENOUS | Status: DC
  Administered 2019-07-13: 2 g via INTRAVENOUS
  Filled 2019-07-13 (×2): qty 2

## 2019-07-13 MED ORDER — VANCOMYCIN HCL IN DEXTROSE 1-5 GM/200ML-% IV SOLN: 1000.0000 mg | INTRAVENOUS | Status: DC

## 2019-07-13 NOTE — Progress Notes (Addendum)
Pharmacy Antibiotic Note  Gregory [name] is a 69 y.o. male admitted on 06/07/2019 with sepsis.  Pharmacy has been consulted for vancomycin and cefepime dosing.  Patient is requiring multiple pressors and overall condition is worsening. On 3/9, was transferred back to the ICU for CRRT. WBC elevated at 33. Normothermic while on CRRT.   ADDENDUM: Dose adjusted antibiotics for CRRT being held. Vanc 1750 mg x1, will follow renal function for further dosing. Cefepime 2g IV q24hr.   Plan: - Start cefepime 2g IV q12hr - Start vancomycin: 1750 mg IV x1 followed by 1000 mg IV q24hr while on CRRT - F/u cultures/sensitivities, MRSA PCR, renal function improvement and CRRT/iHD plan - Monitor vancomycin levels at steady state as indicated  Height: 6\' 1"  (185.4 cm) Weight: 196 lb 6.9 oz (89.1 kg) IBW/kg (Calculated) : 79.9  Temp (24hrs), Avg:97.3 F (36.3 C), Min:96.9 F (36.1 C), Max:97.9 F (36.6 C)  Recent Labs  Lab 07/08/19 0325 07/09/19 0708 07/09/19 0748 07/11/19 0715 07/12/19 0546 07/12/19 1430 07/13/19 0827  WBC   < >  --  13.5* 12.8* 11.7* 15.3* 31.0*  CREATININE  --  3.06*  --  4.82* 3.46* 3.74* 3.91*   < > = values in this interval not displayed.    Estimated Creatinine Clearance: 19.9 mL/min (A) (by C-G formula based on SCr of 3.91 mg/dL (H)).    No Known Allergies  Antimicrobials this admission: 3/10 Vancomycin >>  3/10 Cefepime >>   Dose adjustments this admission:  Microbiology results: 3/10 BCx: sent 3/10 UCx: sent  3/10 Sputum: sent  3/10 MRSA PCR: sent  Thank you for allowing pharmacy to be a part of this patient's care.  Agnes Lawrence, PharmD PGY1 Pharmacy Resident

## 2019-07-13 NOTE — Progress Notes (Signed)
Per Rocco Serene AD, pt may have son Mario Coronado visit in addition to wife Mickel Baas.

## 2019-07-13 NOTE — Progress Notes (Addendum)
Nutrition Follow-up  DOCUMENTATION CODES:   Severe malnutrition in context of chronic illness  INTERVENTION:   -D/c renal MVI -B-complex with vitamin C daily -Vital 1.5 @ 35 ml/hr via Cortrak tube (840 ml/day) -60 ml Prostat TID  Tube feeding regimen will provide2220kcals, 147grams of protein, 644m free water   NUTRITION DIAGNOSIS:   Severe Malnutrition related to chronic illness(ESRD on HD) as evidenced by severe muscle depletion, severe fat depletion.  Ongoing  GOAL:   Patient will meet greater than or equal to 90% of their needs  Met with TF  MONITOR:   TF tolerance, PO intake, Supplement acceptance  REASON FOR ASSESSMENT:   Consult Assessment of nutrition requirement/status  ASSESSMENT:   69yo male admitted with septic shock from fulminant cystitis. PMH includes HTN, CHF, HF, GERD, ESRD on HD, anemia.  2/15-2/21/21CRRT 2/23 unable to get cortrak due to platelets of 9 2/24 pt upgraded to DYS3/thins diet 2/28 transition back to CRRT due to hypotension. Pt on quad strength neo due to volume. 3/2 off CRRT 3/9- transferred to ICU for hypotension and pressor support; s/p cardiac arrest; intubated  Patient is currently intubated on ventilator support MV: 14 L/min Temp (24hrs), Avg:97.7 F (36.5 C), Min:96.9 F (36.1 C), Max:100.2 F (37.9 C)  MAP: 64  Reviewed I/O's: +11.3 L x 24 hours and +19.3 L since 06/29/19  Pt receiving TF via cortrak tube: Vital 1.5 @ 55 ml/hr and 60 ml Prostat TID. Regimen provides 2580kcals, 179grams of protein, 10062mfree water, meeting >100% of estimated kcal and protein needs.   Medications reviewed and include epinephrine, fentanyl, norepinephrine, and vasopressin.   Palliative care continuing to follow for goals of care discussions. Prognosis is very poor. Per nephrology notes, CRRT and iHD not recommended at this time due to severe hemodynamic instability. Pt may require transition to comfort measures if unable to  successfully dialyze.   Labs reviewed: CBGS: 91-129.   Diet Order:   Diet Order    None      EDUCATION NEEDS:   Not appropriate for education at this time  Skin:  Skin Assessment: Reviewed RN Assessment  Last BM:  07/13/19 (via rectal tube)  Height:   Ht Readings from Last 1 Encounters:  07/12/19 '6\' 1"'  (1.854 m)    Weight:   Wt Readings from Last 1 Encounters:  07/13/19 89.1 kg    Ideal Body Weight:  91.8 kg  BMI:  Body mass index is 25.92 kg/m.  Estimated Nutritional Needs:   Kcal:  1701  Protein:  135-160 grams  Fluid:  per MD    JeLoistine ChanceRD, LDN, CDStoneegistered Dietitian II Certified Diabetes Care and Education Specialist Please refer to [facility] and/or RD on-call/weekend/after hours pager

## 2019-07-13 NOTE — Progress Notes (Signed)
Paged eLink about critical hgb on the abg as it was 5.4. Verbal order to check an H&H. Recheck hemoglobin was 5.9. New order to give 3 units of blood.

## 2019-07-13 NOTE — Progress Notes (Signed)
Daily Progress Note   Patient Name: Gregory [name]       Date: 07/13/2019 DOB: [birth year]  Age: 69 y.o. MRN#: [id number] Attending Physician: [name], MD Primary Care Physician: Nicholos Johns, MD Admit Date: 06/16/2019  Reason for Consultation/Follow-up: Establishing goals of care  Subjective: Events of last night noted- patient now unable to undergo CRRT in setting of shock. Was following commands last night, not today. BP low even on multiple pressors. Lactic acid 7. Albumin 1.9. Discussed his difficult situation with his spouse.  Reviewed options of continued aggressive medical care vs comfort measures only. Gave support as she considered what would be a tolerable state of living for [place]. She notes that his goals would be to be able to return home as he was before. I discussed with her that given his current state and prolonged 4 week hospitalization it is unlikely he will be able to return home.   ROS  Length of Stay: 24  Current Medications: Scheduled Meds:  . amiodarone  200 mg Oral BID  . chlorhexidine gluconate (MEDLINE KIT)  15 mL Mouth Rinse BID  . Chlorhexidine Gluconate Cloth  6 each Topical Q0600  . darbepoetin (ARANESP) injection - NON-DIALYSIS  60 mcg Subcutaneous Q Fri-1800  . famotidine  10 mg Oral Daily  . feeding supplement (PRO-STAT SUGAR FREE 64)  60 mL Per Tube TID  . hydrocortisone  10 mg Oral q AM  . hydrocortisone  20 mg Oral QPM  . mouth rinse  15 mL Mouth Rinse 10 times per day  . midodrine  20 mg Oral TID WC  . multivitamin  1 tablet Oral QHS  . sodium chloride flush  10-40 mL Intracatheter Q12H  . vancomycin variable dose per unstable renal function (pharmacist dosing)   Does not apply See admin instructions    Continuous Infusions: . sodium  chloride Stopped (07/02/19 1258)  . sodium chloride Stopped (06/30/19 1303)  . sodium chloride    . sodium chloride    . [START ON 07/14/2019] ceFEPime (MAXIPIME) IV    . epinephrine 12 mcg/min (07/13/19 1100)  . feeding supplement (VITAL 1.5 CAL) 55 mL/hr at 07/13/19 0700  . fentaNYL infusion INTRAVENOUS 50 mcg/hr (07/13/19 1100)  . ferric gluconate (FERRLECIT/NULECIT) IV Stopped (07/12/19 1900)  . norepinephrine (LEVOPHED) Adult infusion 40 mcg/min (  07/13/19 1100)  . phenylephrine (NEO-SYNEPHRINE) Adult infusion 400 mcg/min (07/13/19 1336)  . vancomycin 1,750 mg (07/13/19 1149)  . vasopressin (PITRESSIN) infusion - *FOR SHOCK* 0.03 Units/min (07/13/19 1100)    PRN Meds: sodium chloride, sodium chloride, alteplase, heparin, heparin, lidocaine (PF), lidocaine-prilocaine, loperamide, ondansetron (ZOFRAN) IV, pentafluoroprop-tetrafluoroeth, phenol, sodium chloride flush, traZODone  Physical Exam          Vital Signs: BP (!) 75/39   Pulse (!) 40   Temp 99.5 F (37.5 C) (Axillary)   Resp (!) 25   Ht '6\' 1"'$  (1.854 m)   Wt 89.1 kg   SpO2 (!) 65%   BMI 25.92 kg/m  SpO2: SpO2: (!) 65 % O2 Device: O2 Device: Ventilator O2 Flow Rate: O2 Flow Rate (L/min): 3 L/min  Intake/output summary:   Intake/Output Summary (Last 24 hours) at 07/13/2019 1338 Last data filed at 07/13/2019 1100 Gross per 24 hour  Intake 12002.72 ml  Output 97 ml  Net 11905.72 ml   LBM: Last BM Date: 07/13/19 Baseline Weight: Weight: 72.8 kg Most recent weight: Weight: 89.1 kg       Palliative Assessment/Data: PPS: 10%    Flowsheet Rows     Most Recent Value  Intake Tab  Referral Department  Hospitalist  Unit at Time of Referral  Cardiac/Telemetry Unit  Palliative Care Primary Diagnosis  Sepsis/Infectious Disease  Date Notified  06/29/19  Palliative Care Type  New Palliative care  Reason for referral  Clarify Goals of Care  Date of Admission  06/29/2019  Date first seen by Palliative Care  06/30/19  #  of days Palliative referral response time  1 Day(s)  # of days IP prior to Palliative referral  11  Clinical Assessment  Palliative Performance Scale Score  20%  Pain Max last 24 hours  Not able to report  Pain Min Last 24 hours  Not able to report  Dyspnea Max Last 24 Hours  Not able to report  Dyspnea Min Last 24 hours  Not able to report  Psychosocial & Spiritual Assessment  Palliative Care Outcomes      Patient Active Problem List   Diagnosis Date Noted  . Hypotension   . Sepsis due to undetermined organism ([place])   . Goals of care, counseling/discussion   . Palliative care by specialist   . DNR (do not resuscitate) discussion   . Thrombocytopenia ([place])   . Protein-calorie malnutrition, severe 06/23/2019  . Septic shock ([place]) 06/19/2019  . 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 Preop cardiovascular exam 03/09/2014  . 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 Gout 08/25/2013  . Other complications due to renal dialysis device, implant, and graft 02/03/2013  . 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 Anemia in chronic kidney disease(285.21) 01/27/2013  . NICM (nonischemic cardiomyopathy) ([place]) 12/20/2012  . UTI (urinary tract infection) 12/20/2012  . 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 Decubitus ulcer of buttock, stage 3 ([place]) 12/20/2012  . ESRD (end stage renal disease) ([place]) 12/17/2012  . 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 Essential hypertension, benign 12/14/2012  . Anasarca 12/14/2012  . Back pain 12/14/2012    Palliative Care Assessment & Plan   Patient Profile: 69 y.o. male  with past medical history of M/W/F dialysis anemia, heart failure, hypertension, iliac aneurysm status post stent grafting repair, known celiac and superior mesenteric artery aneurysmsadmitted 2/13 with septic shock with severe thrombocytopenia found to have pyocystitis with Citrobacter bacteremia. He was transferred out of ICU  and to [facility] on 2/23. Returned to ICU on 2/25 for recurrent hypotension. Cardiac arrest 3/9- currently ventilated and on multiple pressors- unable to undergo dialysis or CRRT.  Assessment/Recommendations/Plan  Patients looks to be dying despite all current support Patient's spouse appreciated Palliative discussion- wishes to discuss the situation with her family- she accepted my contact information- will plan to followup with her again tomorrow if patient survives the night  Goals of Care and Additional Recommendations: Limitations on Scope of Treatment: Full Scope Treatment  Code Status: Limited code  Prognosis:  Unable to determine- prognosis is very poor  Discharge Planning: To Be Determined  Care plan was discussed with patient's nursing team and spouse.   Thank you for allowing the Palliative Medicine Team to assist in the care of this patient.   Time In: 1200 Time Out: 1235 Total Time 35 mins Prolonged Time Billed no      Greater than 50%  of this time was spent counseling and coordinating care related to the above assessment and plan.  Mariana Kaufman, AGNP-C Palliative Medicine   Please contact Palliative Medicine Team phone at [phone] for questions and concerns.

## 2019-07-13 NOTE — Progress Notes (Signed)
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  He was transferred out of ICU and to [facility] on 2/23.  Called 2/25 for recurrent hypotension, not responsive to fluids/ albumin therefore was transferred back to ICU for vasopressor support.     Past Medical History   has a past medical history of Anemia in chronic kidney disease, Arthritis, CHF (congestive heart failure) ([place]), Chronic back pain, End stage renal disease ([place]), GERD (gastroesophageal reflux disease), Gout, Heart failure with reduced ejection fraction ([place]), and Hypertension. celiac and superior mesenteric artery aneurysms  Significant Hospital Events   2/14 Admit to [facility] 2/14 He developed SVT, was hemodynamically unstable, treated initially with adenosine but ultimately required DCCV around 715. Has remained hypotensive, received albumin and now another 2 L IV fluid resuscitation (5 L total). Interacting but confused, still not back to baseline per wife at bedside 2/15 Stool-like produced from in-out urinary cath 2/16 Urology consult, confirmed pyocystitis and drained bladder 2/22 out of ICU to [facility] on 2/23 2/25 PCCM re-consult given hypotension 2/26 Remains on vasopressors support  2/28 CRRT initiated 3/1 Neo at 150 mcg/min , plts 6K >> transfused 3/4 back on TRH, out of ICU 3/9 back to ICU for CRRT per nephrology   Consults:  Nephrology 2/14 Urology 2/16 Hematology 2/22 Palliative care 2/26  PCCM 3/9  Procedures:  2/14 left IJ HD cath  insertion >3/5 2/16 Bedside bladder aspiration 2/25 foley >> 2/26 (no output or pus noted)  Significant Diagnostic Tests:  2/14 CXR>>Cardiomegaly.  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 Chronic elevation of the left hemidiaphragm with chronic volume loss at the left lung base.  2/14 CT Abd 1. No acute pulmonary embolism. 2. Small bilateral pleural effusions with adjacent atelectasis. 3. Dilated main pulmonary artery, which may be secondary to pulmonary arterial hypertension. 4. Interval development of wedge-shaped defects involving the spleen, which may represent splenic infarcts. 5. Cholelithiasis with mild gallbladder wall thickening and pericholecystic free fluid, which may be secondary to underlying liver disease. If there is clinical concern for acute cholecystitis, follow-up with ultrasound is recommended. 6. 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 7. 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 8. Additional chronic findings as detailed above. 9. Aortic Atherosclerosis (ICD10-I70.0).  2/16 CT Pelvis with Rectal contrast 1. Negative for colovesicular fistula. Rectal contrast only opacifies the colon. 2. Bladder wall thickening likely from chronic outlet obstruction. The prostate is enlarged. 3. Prominent anasarca.  CT Head 2/14 No acute intracranial finding. Atrophy. Chronic small-vessel ischemic changes. 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  Echo 2/14 . Left ventricular ejection fraction, by estimation, is 60 to 65%. The  left ventricle has normal function. The left ventricle has no regional  wall motion abnormalities. Left ventricular diastolic parameters are  indeterminate.   2. Right ventricular systolic function is normal. The  right ventricular  size is normal.  3. Left atrial size was severely dilated.   4. Right atrial size was mildly dilated.   5. The mitral valve is degenerative. Mild mitral valve regurgitation. No  evidence of mitral stenosis.   6. The aortic valve is tricuspid. Aortic valve regurgitation is mild to  moderate. Mild aortic valve stenosis.   7. The inferior vena cava is normal in size with greater than 50%  respiratory variability, suggesting right atrial pressure of 3 mmHg.   RUQ U/S 2/15 1. Both the gallbladder and the CBD appear mildly distended with sludge but no stone identified. No intrahepatic biliary ductal dilatation to strongly suggest acute bile duct obstruction. Some gallbladder wall thickening, although no sonographic Murphy sign elicited to strongly suggest acalculous cholecystitis. 2. If abdominal pain continues then a nuclear medicine hepatobiliary scan would be valuable to exclude acute CBD or cystic duct Obstruction.  2/25 BLE DVT US >> neg 2/25 LUE DVT US >> neg  2/25 CT abd/ pelvis >> 1. Small bilateral pleural effusions. 2. Cardiomegaly with diffuse atherosclerosis of the coronary vessels. 3. Cholelithiasis without cholecystitis. 4. Bilateral renal atrophy consistent with end-stage renal disease. Stable indeterminate 1.2 cm lesion left kidney. 5. High attenuation material within the bladder lumen may be related to excretion of previously administered contrast. Please correlate with urinalysis. 6. Trace simple appearing pelvic free fluid. 7. Stable endoluminal stent graft within the aorta and bilateral common iliac arteries. Evaluation of the vascular lumen is limited without contrast. 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  Micro Data:  2/14 respiratory viral panel>> negative 2/14 blood cultures x2>> Citrobacter, sensitivities below 2/15 UCx Citrobacter pansensitive 2/25 BCx2 >>NGTD  Antimicrobials:  Flagyl 2/14 only Cefepime 2/14 >  2/15 Vancomycin 2/14 > 2/15 Meropenem 2/15 > 2/16 Ceftriaxone 2/16 > 2/22 Cefazolin 2/23 > 2/27 Cefepime 3/10 >>  Vancomycin 3/10 >>   Interim history/subjective:  Events of 3/9 noted: Acute PEA arrest, CPR for 9 minutes.  Then required DC cardioversion for atrial fibrillation with RVR.  Profound shock with norepinephrine, phenylephrine, epinephrine infusion, vasopressin currently running He is starting to show some improvement in mental status, interacting with RN Anemia this morning, hemoglobin 5.9, PRBC ordered  Objective   Blood pressure (!) 110/37, pulse 84, temperature 97.7 F (36.5 C), temperature source Axillary, resp. rate (!) 24, height 6\' 1"  (1.854 m), weight 89.1 kg, SpO2 99 %.    Vent Mode: PRVC FiO2 (%):  [40 %-100 %] 40 % Set Rate:  [14 bmp-20 bmp] 14 bmp Vt Set:  [560 mL-640 mL] 640 mL PEEP:  [5 cmH20-8 cmH20] 5 cmH20 Plateau Pressure:  [22 cmH20-23 cmH20] 23 cmH20   Intake/Output Summary (Last 24 hours) at 07/13/2019 0858 Last data filed at 07/13/2019 0700 Gross per 24 hour  Intake 11350.28 ml  Output 97 ml  Net 11253.28 ml   Filed Weights   07/11/19 0700 07/11/19 1038 07/13/19 0500  Weight: 101.2 kg 98.9 kg 89.1 kg   General:  Chronically ill, frail elderly male sitting in bed in CRRT in NAD HEENT: MM pink/moist, no JVD, left IJ dialysis catheter Neuro: Awake, appropriate, pleasant today, MAE , appears weak and deconditioned CV: rr, no rub or murmur PULM: diminished bibasilar sounds.  GI: soft flat ndnt. + bowel sounds  Extremities: decreased muscle bulk BUE BLE, symmetrical. No cyanosis or clubbing  Skin: c/d/w without rash  Resolved Hospital Problem list   Septic Shock 2/2 to Citrobacter bacteremia  Acute Metabolic encephalopathy Pyocystitis -resolved, no evidence of colovesical  fistula Hypokalemia Hypophosphatemia  Assessment & Plan:   ESRD, has been transitioned to IHD but associated hypotension.  P: With new progressive shock clearly not a  candidate for IHD, likely would not tolerate CVVH either.  Unclear whether we will achieve enough hemodynamic stability to attempt to restart.  Will discuss with nephrology.  If restart CVVH not possible then will discuss goals of care, possible transition to comfort with family  Acute respiratory failure, principally due to cardiac arrest, consider also contribution volume overload from cardiorenal syndrome P: Not in a position for volume removal at this time due to his hemodynamics Continue vent support, PRVC.  Okay to attempt PSV but would not for extubation unless underlying issues can be addressed, resolved  Shock, superimposed on chronic hypotension.  Etiology unclear, consider new sepsis, component of hemorrhagic shock, cardiogenic -component of chronic hypotnesion, on midodrine  -has had acuate intermittent hypotension inpatient, difficulty pulling fluid during iHD P: Now on multiple pressors, wean as able Consider empiric broad-spectrum antibiotics given degree of his hemodynamic instability Panculture 3/10 Remains on midodrine, Solu-Cortef   Severe thrombocytopenia, 57k on 3/9 Normocytic anemia, acute worsening 3/9 P:  Following CBC.  Transfusion goal plt > 10k if no evidence bleeding No evidence of active blood loss.  Consider retroperitoneal blood.  Too unstable to go for CT abdomen at this time  Afib - new onset inpatient, CHADSVASC2 score 3 - not candidate for anticoag with severe thrombocytopenia P:  Continue amiodarone per tube Required cardioversion while unstable 3/9.  No plans for repeat DCCV based on discussions with family Continue telemetry monitoring  Protein calorie malnutrition, dysphagia Immobility  Deconditioning  P:  Tube feeding as ordered via core track.  Would defer discussions regarding PEG until we determine whether he can recover from acute events Appreciate ongoing palliative care discussions   Celiac and superior mesenteric artery  aneurysms -On CT scanning 05/2019 aneurysmal dilatation of bilateral common iliac arteries again noted. Left measures 3.5 cm in diameter, R 3.3 cm.  P:  OP vascular follow up   Left kidney mass P:  Will need OP follow up   Best practice:  Diet: Holding EN as will need to lay flat for iHD cath placement  Pain/Anxiety/Delirium protocol (if indicated): N/A VAP protocol (if indicated): N/A DVT prophylaxis: SCDs GI prophylaxis: PPI Glucose control: CBG q 4  Mobility: PT/OR Code Status: Full Family Communication: Wife updated 3/10 Disposition: ICU   Independent CC time 34 minutes  Baltazar Apo, MD, PhD 07/13/2019, 9:15 AM [facility] [phone] or if no answer [phone]

## 2019-07-13 NOTE — Progress Notes (Signed)
[facility] Progress Note  Subjective: pt moved to ICU yest and started CRRT mid afternoon , around 5 pm had PEA cardiac arrest, had 57mn CPR to ROSC, then severe afib/ RVR and severe shock. CRRT was held and this am is on max pressors x 3.    Vitals:   07/13/19 0715 07/13/19 0730 07/13/19 0737 07/13/19 0800  BP: (!) 104/50 (!) 102/51 (!) 110/37   Pulse: 88 90 84   Resp: (!) 24 20 (!) 24   Temp:    97.7 F (36.5 C)  TempSrc:    Axillary  SpO2: 100% 100% 99%   Weight:      Height:        Exam: General: on vent, not responsive Heart: regular rhythm, no murmurs, rubs or gallops Lungs: coarse post rales bilat Abdomen: Soft, non-tender, non-distended, +BS Extremities: 2-3+ diffuse edema x 4 ext  Dialysis Access: RUE AVF +bruit Neuro - as above    [place] 400/800 73kg 2/3.5Ca bath P4 AVF Hep 3300 No VDRA/ESA   Assessment/Plan: 1. ESRD: sp CRRT 2/15-2/21 then again 2/8-3/2 due to septic shock. [facility] developed progressive anasarca and then pulm edema. Went back to ICU yest and resumed CRRT, 2hrs later pt had PEA arrest and severe post-arrest shock, now on max pressors.  Would recommend against resuming CRRT given severe hemodynamic instability.  May have to consider transition to comfort measures for this patient given recurrent inability to dialyze successfully w/ iHD.  2. Citrobacter sepsis: Seen by urologist and thought to be pyocystis status post Foley drainage and course of antibiotics.  3. HTN/volume:  BP soft on midodrine 2073mID, hydrocortisone. Up 20kg + by wts, grossly edematous.  4. Anemia: Hgb 8.2. On aranesp 29m46meekly. Tsat 12%, started on IV Fe. Transfuse PRN.  5.  Thrombocytopenia - plts up to 43k 6. Secondary hyperparathyroidism:  Phos okay , not on binder. Corrected calcium 9.5. Not on VDRA.  7. Nutrition:   Alb 2.0. Renal diet with fluid restriction.  8. Dispo: partial code    Rob  Bodee Lafoe 07/13/2019, 10:13 AM   Recent Labs  Lab 07/12/19 0546 07/12/19 0546 07/12/19 1430 07/12/19 1430 07/12/19 2053 07/12/19 2053 07/12/19 2124 07/13/19 0827  K 3.7   < > 3.1*   < > 3.5  --   --  3.8  BUN 96*   < > 108*  --   --   --   --  109*  CREATININE 3.46*   < > 3.74*  --   --   --   --  3.91*  CALCIUM 8.4*   < > 8.0*  --   --   --   --  8.2*  PHOS 3.7  --  3.9  --   --   --   --   --   HGB 8.3*   < > 7.3*   < > 5.4*   < > 5.9* 9.5*   < > = values in this interval not displayed.   Inpatient medications: . amiodarone  200 mg Oral BID  . chlorhexidine gluconate (MEDLINE KIT)  15 mL Mouth Rinse BID  . Chlorhexidine Gluconate Cloth  6 each Topical Q0600  . darbepoetin (ARANESP) injection - NON-DIALYSIS  60 mcg Subcutaneous Q Fri-1800  . famotidine  10 mg Oral Daily  . feeding supplement (PRO-STAT SUGAR FREE 64)  60 mL Per Tube TID  . hydrocortisone  10 mg Oral q  AM  . hydrocortisone  20 mg Oral QPM  . mouth rinse  15 mL Mouth Rinse 10 times per day  . midodrine  20 mg Oral TID WC  . multivitamin  1 tablet Oral QHS  . sodium chloride flush  10-40 mL Intracatheter Q12H   . sodium chloride Stopped (07/02/19 1258)  . sodium chloride Stopped (06/30/19 1303)  . sodium chloride    . sodium chloride    . ceFEPime (MAXIPIME) IV    . epinephrine 13 mcg/min (07/13/19 1007)  . feeding supplement (VITAL 1.5 CAL) 55 mL/hr at 07/13/19 0700  . fentaNYL infusion INTRAVENOUS 25 mcg/hr (07/13/19 0700)  . ferric gluconate (FERRLECIT/NULECIT) IV Stopped (07/12/19 1900)  . norepinephrine (LEVOPHED) Adult infusion 40 mcg/min (07/13/19 1004)  . phenylephrine (NEO-SYNEPHRINE) Adult infusion 400 mcg/min (07/13/19 0815)  . [START ON 07/14/2019] vancomycin    . vancomycin    . vasopressin (PITRESSIN) infusion - *FOR SHOCK* 0.03 Units/min (07/13/19 0700)   sodium chloride, sodium chloride, alteplase, heparin, heparin, lidocaine (PF), lidocaine-prilocaine, loperamide, ondansetron (ZOFRAN) IV,  pentafluoroprop-tetrafluoroeth, phenol, sodium chloride flush, traZODone

## 2019-07-14 ENCOUNTER — Inpatient Hospital Stay (HOSPITAL_COMMUNITY): Payer: Medicare Other

## 2019-07-14 DIAGNOSIS — D62 Acute posthemorrhagic anemia: Secondary | ICD-10-CM

## 2019-07-14 DIAGNOSIS — J811 Chronic pulmonary edema: Secondary | ICD-10-CM

## 2019-07-14 LAB — BPAM RBC
Blood Product Expiration Date: 202104102359
Blood Product Expiration Date: 202104102359
Blood Product Expiration Date: 202104102359
ISSUE DATE / TIME: 202103092330
ISSUE DATE / TIME: 202103100119
ISSUE DATE / TIME: 202103100312
Unit Type and Rh: 5100
Unit Type and Rh: 5100
Unit Type and Rh: 5100

## 2019-07-14 LAB — TYPE AND SCREEN
ABO/RH(D): O POS
Antibody Screen: NEGATIVE
Unit division: 0
Unit division: 0
Unit division: 0

## 2019-07-14 LAB — CBC
HCT: 26.7 % — ABNORMAL LOW (ref 39.0–52.0)
HCT: 25.4 % — ABNORMAL LOW (ref 39.0–52.0)
Hemoglobin: 8.5 g/dL — ABNORMAL LOW (ref 13.0–17.0)
Hemoglobin: 8.1 g/dL — ABNORMAL LOW (ref 13.0–17.0)
MCH: 30.5 pg (ref 26.0–34.0)
MCH: 30.5 pg (ref 26.0–34.0)
MCHC: 31.8 g/dL (ref 30.0–36.0)
MCHC: 31.9 g/dL (ref 30.0–36.0)
MCV: 95.7 fL (ref 80.0–100.0)
MCV: 95.5 fL (ref 80.0–100.0)
Platelets: 40 10*3/uL — ABNORMAL LOW (ref 150–400)
Platelets: 42 10*3/uL — ABNORMAL LOW (ref 150–400)
RBC: 2.79 MIL/uL — ABNORMAL LOW (ref 4.22–5.81)
RBC: 2.66 MIL/uL — ABNORMAL LOW (ref 4.22–5.81)
RDW: 18.3 % — ABNORMAL HIGH (ref 11.5–15.5)
RDW: 18.6 % — ABNORMAL HIGH (ref 11.5–15.5)
WBC: 21 10*3/uL — ABNORMAL HIGH (ref 4.0–10.5)
WBC: 20.5 10*3/uL — ABNORMAL HIGH (ref 4.0–10.5)
nRBC: 0.9 % — ABNORMAL HIGH (ref 0.0–0.2)
nRBC: 0.8 % — ABNORMAL HIGH (ref 0.0–0.2)

## 2019-07-14 LAB — PROTIME-INR
INR: 2.2 — ABNORMAL HIGH (ref 0.8–1.2)
Prothrombin Time: 24.6 seconds — ABNORMAL HIGH (ref 11.4–15.2)

## 2019-07-14 LAB — BASIC METABOLIC PANEL
Anion gap: 16 — ABNORMAL HIGH (ref 5–15)
BUN: 128 mg/dL — ABNORMAL HIGH (ref 8–23)
CO2: 18 mmol/L — ABNORMAL LOW (ref 22–32)
Calcium: 7.7 mg/dL — ABNORMAL LOW (ref 8.9–10.3)
Chloride: 105 mmol/L (ref 98–111)
Creatinine, Ser: 4.26 mg/dL — ABNORMAL HIGH (ref 0.61–1.24)
GFR calc Af Amer: 15 mL/min — ABNORMAL LOW (ref >60)
GFR calc non Af Amer: 13 mL/min — ABNORMAL LOW (ref >60)
Glucose, Bld: 140 mg/dL — ABNORMAL HIGH (ref 70–99)
Potassium: 3.5 mmol/L (ref 3.5–5.1)
Sodium: 139 mmol/L (ref 135–145)

## 2019-07-14 LAB — POCT I-STAT 7, (LYTES, BLD GAS, ICA,H+H)
Acid-base deficit: 5 mmol/L — ABNORMAL HIGH (ref 0.0–2.0)
Bicarbonate: 20.3 mmol/L (ref 20.0–28.0)
Calcium, Ion: 1.12 mmol/L — ABNORMAL LOW (ref 1.15–1.40)
HCT: 24 % — ABNORMAL LOW (ref 39.0–52.0)
Hemoglobin: 8.2 g/dL — ABNORMAL LOW (ref 13.0–17.0)
O2 Saturation: 98 %
Patient temperature: 99.5
Potassium: 4.1 mmol/L (ref 3.5–5.1)
Sodium: 138 mmol/L (ref 135–145)
TCO2: 21 mmol/L — ABNORMAL LOW (ref 22–32)
pCO2 arterial: 38.4 mmHg (ref 32.0–48.0)
pH, Arterial: 7.334 — ABNORMAL LOW (ref 7.350–7.450)
pO2, Arterial: 122 mmHg — ABNORMAL HIGH (ref 83.0–108.0)

## 2019-07-14 LAB — GLUCOSE, CAPILLARY
Glucose-Capillary: 105 mg/dL — ABNORMAL HIGH (ref 70–99)
Glucose-Capillary: 97 mg/dL (ref 70–99)
Glucose-Capillary: 105 mg/dL — ABNORMAL HIGH (ref 70–99)
Glucose-Capillary: 115 mg/dL — ABNORMAL HIGH (ref 70–99)
Glucose-Capillary: 136 mg/dL — ABNORMAL HIGH (ref 70–99)
Glucose-Capillary: 130 mg/dL — ABNORMAL HIGH (ref 70–99)

## 2019-07-14 LAB — HEPATIC FUNCTION PANEL
ALT: 1404 U/L — ABNORMAL HIGH (ref 0–44)
AST: 1444 U/L — ABNORMAL HIGH (ref 15–41)
Albumin: 1.8 g/dL — ABNORMAL LOW (ref 3.5–5.0)
Alkaline Phosphatase: 120 U/L (ref 38–126)
Bilirubin, Direct: 0.4 mg/dL — ABNORMAL HIGH (ref 0.0–0.2)
Indirect Bilirubin: 1.3 mg/dL — ABNORMAL HIGH (ref 0.3–0.9)
Total Bilirubin: 1.7 mg/dL — ABNORMAL HIGH (ref 0.3–1.2)
Total Protein: 4 g/dL — ABNORMAL LOW (ref 6.5–8.1)

## 2019-07-14 LAB — LACTIC ACID, PLASMA: Lactic Acid, Venous: 1.6 mmol/L (ref 0.5–1.9)

## 2019-07-14 LAB — MAGNESIUM: Magnesium: 2.4 mg/dL (ref 1.7–2.4)

## 2019-07-14 MED ORDER — HYDROCORTISONE 20 MG PO TABS
50.0000 mg | ORAL_TABLET | Freq: Four times a day (QID) | ORAL | Status: DC
  Administered 2019-07-14 – 2019-07-15 (×4): 50 mg via ORAL
  Filled 2019-07-14 (×5): qty 1

## 2019-07-14 NOTE — Progress Notes (Signed)
[facility] Progress Note  Subjective: epi gtt weaned off, still on max levo/ neo and vaso gtt's. Labs reviewed.   Vitals:   07/14/19 1215 07/14/19 1230 07/14/19 1245 07/14/19 1300  BP:  (!) 97/44  (!) 98/50  Pulse: 86 86 85 89  Resp:  (!) 21 (!) 22 20  Temp:      TempSrc:      SpO2: 100% 100% 100% 100%  Weight:      Height:        Exam: General: on vent, not responsive Heart: regular rhythm, no murmurs, rubs or gallops Lungs: coarse post rales bilat Abdomen: Soft, non-tender, non-distended, +BS Extremities: sig + diffuse edema x 4 ext  Dialysis Access: RUE AVF +bruit  Neuro - as above    Dialysis: [place] MWF 3h 2mn 400/800 73kg 2/3.5Ca bath P4 AVF Hep 3300 No VDRA/ESA   Assessment/Plan: 1. ESRD: sp CRRT 2/15-2/21 then again 2/8-3/2 due to recurrent septic shock. [facility] developed progressive anasarca and then resp failure.  Went back to ICU for 3rd time on 3/09 and resumed CRRT, 2hrs later pt had PEA arrest and severe post-arrest shock requiring max pressors. Labs reviewed. Remains on 3 pressors at max dose. Would cont to hold off on CRRT for now. Pall care discussions are ongoing. Will follow.  2.  Resp failure: pulm edema/ cardiorenal +/- PNA, on vent. Per CCM.  3. Citrobacter sepsis: admitting diagnosis. Seen by urologist and thought to be pyocystis status post Foley drainage and course of antibiotics.  4. HTN/volume:  BP soft on midodrine 265mTID, hydrocortisone. Sig volume overload unable to maintain euvolemia w/ regular HD due to hypotension on HD.  5. Anemia: Hgb 8.2. On aranesp 6063mweekly. Tsat 12%, started on IV Fe. Transfuse PRN.  6.  Thrombocytopenia - plts up to 43k 7. Secondary hyperparathyroidism:  Phos okay , not on binder. Corrected calcium 9.5. Not on VDRA.  8. Nutrition:   Alb 2.0. Renal diet with fluid restriction.  9. Dispo: partial code    Gregory [name] 07/14/2019, 1:08 PM   Recent Labs   Lab 07/12/19 0546 07/12/19 0546 07/12/19 1430 07/12/19 2053 07/13/19 0827 07/13/19 0827 07/14/19 0412 07/14/19 0412 07/14/19 0539 07/14/19 1234  K 3.7   < > 3.1*   < > 3.8   < > 4.1  --  3.5  --   BUN 96*   < > 108*  --  109*  --   --   --  128*  --   CREATININE 3.46*   < > 3.74*  --  3.91*  --   --   --  4.26*  --   CALCIUM 8.4*   < > 8.0*  --  8.2*  --   --   --  7.7*  --   PHOS 3.7  --  3.9  --   --   --   --   --   --   --   HGB 8.3*   < > 7.3*   < > 9.5*   < > 8.2*   < > 8.5* 8.1*   < > = values in this interval not displayed.   Inpatient medications: . B-complex with vitamin C  1 tablet Oral Daily  . chlorhexidine gluconate (MEDLINE KIT)  15 mL Mouth Rinse BID  . Chlorhexidine Gluconate Cloth  6 each Topical Q0600  . darbepoetin (ARANESP) injection - NON-DIALYSIS  60 mcg Subcutaneous Q Fri-1800  .  famotidine  10 mg Oral Daily  . feeding supplement (PRO-STAT SUGAR FREE 64)  60 mL Per Tube TID  . feeding supplement (VITAL 1.5 CAL)  1,000 mL Per Tube Q24H  . hydrocortisone  50 mg Oral Q6H  . mouth rinse  15 mL Mouth Rinse 10 times per day  . midodrine  20 mg Oral TID WC  . sodium chloride flush  10-40 mL Intracatheter Q12H   . sodium chloride Stopped (06/30/19 1303)  . ceFEPime (MAXIPIME) IV Stopped (07/14/19 1234)  . fentaNYL infusion INTRAVENOUS Stopped (07/13/19 1950)  . ferric gluconate (FERRLECIT/NULECIT) IV Stopped (07/13/19 1513)  . norepinephrine (LEVOPHED) Adult infusion 28 mcg/min (07/14/19 1300)  . phenylephrine (NEO-SYNEPHRINE) Adult infusion 360 mcg/min (07/14/19 1300)  . vasopressin (PITRESSIN) infusion - *FOR SHOCK* Stopped (07/14/19 1020)   alteplase, heparin, heparin, lidocaine (PF), lidocaine-prilocaine, loperamide, ondansetron (ZOFRAN) IV, pentafluoroprop-tetrafluoroeth, phenol, sodium chloride flush, traZODone

## 2019-07-14 NOTE — Progress Notes (Signed)
Epinephrine gtt titrated down and turned off around 0100. Patient tolerating changes. Levophed gtt currently being titrated down next. Patient still on Vasopressin and maxed out dose of neosynephrine as well as levophed. Will continue to titrate as patient tolerates.

## 2019-07-14 NOTE — Progress Notes (Signed)
                                                                                                                                                                                                         Daily Progress Note   Patient Name: Gregory [name]       Date: 07/14/2019 DOB: 01/07/1951  Age: 69 y.o. MRN#: [id number] Attending Physician: Ellison, Chi Jane, MD Primary Care Physician: Uppin, Nina, MD Admit Date: 06/26/2019  Reason for Consultation/Follow-up: Establishing goals of care  Subjective: Remains in poor condition. Responsive to pain. No imminent indications for CRRT just yet per Dr. Ellison- patient would not tolerate if it was needed. Spouse at bedside. Her son is arriving today. She continues to hope for improvement.   ROS  Length of Stay: 25  Current Medications: Scheduled Meds:  . B-complex with vitamin C  1 tablet Oral Daily  . chlorhexidine gluconate (MEDLINE KIT)  15 mL Mouth Rinse BID  . Chlorhexidine Gluconate Cloth  6 each Topical Q0600  . darbepoetin (ARANESP) injection - NON-DIALYSIS  60 mcg Subcutaneous Q Fri-1800  . famotidine  10 mg Oral Daily  . feeding supplement (PRO-STAT SUGAR FREE 64)  60 mL Per Tube TID  . feeding supplement (VITAL 1.5 CAL)  1,000 mL Per Tube Q24H  . hydrocortisone  50 mg Oral Q6H  . mouth rinse  15 mL Mouth Rinse 10 times per day  . midodrine  20 mg Oral TID WC  . sodium chloride flush  10-40 mL Intracatheter Q12H    Continuous Infusions: . sodium chloride Stopped (06/30/19 1303)  . ceFEPime (MAXIPIME) IV Stopped (07/14/19 1234)  . fentaNYL infusion INTRAVENOUS Stopped (07/13/19 1950)  . ferric gluconate (FERRLECIT/NULECIT) IV Stopped (07/13/19 1513)  . norepinephrine (LEVOPHED) Adult infusion 28 mcg/min (07/14/19 1500)  . phenylephrine (NEO-SYNEPHRINE) Adult infusion 360 mcg/min (07/14/19 1500)  . vasopressin (PITRESSIN) infusion - *FOR SHOCK* Stopped (07/14/19 1020)    PRN Meds: alteplase, heparin, heparin, lidocaine  (PF), lidocaine-prilocaine, loperamide, ondansetron (ZOFRAN) IV, pentafluoroprop-tetrafluoroeth, phenol, sodium chloride flush, traZODone  Physical Exam          Vital Signs: BP (!) 110/57   Pulse (!) 107   Temp 99.1 F (37.3 C) (Oral)   Resp (!) 28   Ht 6' 1" (1.854 m)   Wt 93.3 kg   SpO2 95%   BMI 27.14 kg/m  SpO2: SpO2: 95 % O2 Device: O2 Device: Ventilator O2 Flow Rate: O2 Flow Rate (L/min): 3 L/min  Intake/output summary:   Intake/Output Summary (Last 24 hours) at   07/14/2019 1654 Last data filed at 07/14/2019 1500 Gross per 24 hour  Intake 4862.43 ml  Output 1350 ml  Net 3512.43 ml   LBM: Last BM Date: 07/13/19 Baseline Weight: Weight: 72.8 kg Most recent weight: Weight: 93.3 kg       Palliative Assessment/Data: PPS: 10%    Flowsheet Rows     Most Recent Value  Intake Tab  Referral Department  Hospitalist  Unit at Time of Referral  Cardiac/Telemetry Unit  Palliative Care Primary Diagnosis  Sepsis/Infectious Disease  Date Notified  06/29/19  Palliative Care Type  New Palliative care  Reason for referral  Clarify Goals of Care  Date of Admission  06/25/2019  Date first seen by Palliative Care  06/30/19  # of days Palliative referral response time  1 Day(s)  # of days IP prior to Palliative referral  11  Clinical Assessment  Palliative Performance Scale Score  20%  Pain Max last 24 hours  Not able to report  Pain Min Last 24 hours  Not able to report  Dyspnea Max Last 24 Hours  Not able to report  Dyspnea Min Last 24 hours  Not able to report  Psychosocial & Spiritual Assessment  Palliative Care Outcomes      Patient Active Problem List   Diagnosis Date Noted  . Acute respiratory failure with hypoxia ([place])   . End-stage renal disease on hemodialysis ([place])   . Advanced care planning/counseling discussion   . Hypotension   . Sepsis due to undetermined organism ([place])   . Goals of care, counseling/discussion   . Palliative care by specialist   . DNR (do  not resuscitate) discussion   . Thrombocytopenia ([place])   . Protein-calorie malnutrition, severe 06/23/2019  . Septic shock ([place]) 06/19/2019  . 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 Preop cardiovascular exam 03/09/2014  . 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 Gout 08/25/2013  . Other complications due to renal dialysis device, implant, and graft 02/03/2013  . 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 Anemia in chronic kidney disease(285.21) 01/27/2013  . NICM (nonischemic cardiomyopathy) ([place]) 12/20/2012  . UTI (urinary tract infection) 12/20/2012  . 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 Decubitus ulcer of buttock, stage 3 ([place]) 12/20/2012  . ESRD (end stage renal disease) ([place]) 12/17/2012  . 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 Essential hypertension, benign 12/14/2012  . Anasarca 12/14/2012  . Back pain 12/14/2012    Palliative Care Assessment & Plan   Patient Profile: 69 y.o.malewith past medical history of M/W/F dialysis anemia, heart failure, hypertension, iliac aneurysm status post stent grafting repair, known celiac and superior mesenteric artery aneurysmsadmitted 2/13 with septic shock with severe thrombocytopenia found to have pyocystitis with Citrobacter bacteremia. He was transferred out of ICU and to [facility] on 2/23.Returned to ICU on2/25 for recurrent hypotension. Has also developed severe pneumonia- L hemithorax completely opacified. Cardiac arrest 3/9- currently ventilated and on multiple pressors- unable to undergo dialysis or CRRT.  Assessment/Recommendations/Plan  PMT will continue to follow and discuss [place] with spouse Spouse is likely to continue to accept all treatments offered- medical team will likely need to set limits if/when necessary  Goals of Care and Additional Recommendations: Limitations on Scope of Treatment: Full Scope Treatment  Code Status: DNR  Prognosis:  Unable to  determine  Discharge Planning: To Be Determined  Care plan was discussed with patient's spouse.   Thank you for allowing the Palliative Medicine Team to assist in the  care of this patient.   Time In: 1400 Time Out: 1435 Total Time 35 mins Prolonged Time Billed no      Greater than 50%  of this time was spent counseling and coordinating care related to the above assessment and plan.   , AGNP-C Palliative Medicine   Please contact Palliative Medicine Team phone at [phone] for questions and concerns.        

## 2019-07-14 NOTE — Progress Notes (Signed)
RT transported patient from 4N25 to CT and back with RN. No complications. RT will continue to monitor.

## 2019-07-14 NOTE — Progress Notes (Signed)
Wasted 75cc fentanyl gtt in Human resources officer with Lauree Chandler, Therapist, sports.

## 2019-07-14 NOTE — Progress Notes (Signed)
NAME:  Gregory [name], MRN:  [id number], DOB:  06-19-1950, LOS: 56 ADMISSION DATE:  06/20/2019, CONSULTATION DATE:  10/62/69 REFERRING MD: Roxanne Mins , CHIEF COMPLAINT:  Lethargy and AMS   Brief History   69 year old male with past medical history significant for ESRD with M/W/F dialysis anemia, heart failure, hypertension, iliac aneurysm status post stent grafting repair, known celiac and superior mesenteric artery aneurysms admitted 2/13 with septic shock with severe thrombocytopenia found to have pyocystitis with Citrobacter bacteremia.  He was transferred out of ICU and to [facility] on 2/23.  Called 2/25 for recurrent hypotension, not responsive to fluids/ albumin therefore was transferred back to ICU for vasopressor support.     Past Medical History   has a past medical history of Anemia in chronic kidney disease, Arthritis, CHF (congestive heart failure) ([place]), Chronic back pain, End stage renal disease ([place]), GERD (gastroesophageal reflux disease), Gout, Heart failure with reduced ejection fraction ([place]), and Hypertension. celiac and superior mesenteric artery aneurysms  Significant Hospital Events   2/14 Admit to [facility] 2/14 He developed SVT, was hemodynamically unstable, treated initially with adenosine but ultimately required DCCV around 715. Has remained hypotensive, received albumin and now another 2 L IV fluid resuscitation (5 L total). Interacting but confused, still not back to baseline per wife at bedside 2/15 Stool-like produced from in-out urinary cath 2/16 Urology consult, confirmed pyocystitis and drained bladder 2/22 out of ICU to [facility] on 2/23 2/25 PCCM re-consult given hypotension 2/26 Remains on vasopressors support  2/28 CRRT initiated 3/1 Neo at 150 mcg/min , plts 6K >> transfused 3/4 back on TRH, out of ICU 3/9 back to ICU for CRRT per nephrology   Consults:  Nephrology 2/14 Urology 2/16 Hematology 2/22 Palliative care 2/26  PCCM 3/9  Procedures:  2/14 left IJ HD cath  insertion >3/5 2/16 Bedside bladder aspiration 2/25 foley >> 2/26 (no output or pus noted)  Significant Diagnostic Tests:  2/14 CXR>>Cardiomegaly.  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 Chronic elevation of the left hemidiaphragm with chronic volume loss at the left lung base.  2/14 CT Abd 1. No acute pulmonary embolism. 2. Small bilateral pleural effusions with adjacent atelectasis. 3. Dilated main pulmonary artery, which may be secondary to pulmonary arterial hypertension. 4. Interval development of wedge-shaped defects involving the spleen, which may represent splenic infarcts. 5. Cholelithiasis with mild gallbladder wall thickening and pericholecystic free fluid, which may be secondary to underlying liver disease. If there is clinical concern for acute cholecystitis, follow-up with ultrasound is recommended. 6. 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 7. 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 8. Additional chronic findings as detailed above. 9. Aortic Atherosclerosis (ICD10-I70.0).  2/16 CT Pelvis with Rectal contrast 1. Negative for colovesicular fistula. Rectal contrast only opacifies the colon. 2. Bladder wall thickening likely from chronic outlet obstruction. The prostate is enlarged. 3. Prominent anasarca.  CT Head 2/14 No acute intracranial finding. Atrophy. Chronic small-vessel ischemic changes. 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  Echo 2/14 . Left ventricular ejection fraction, by estimation, is 60 to 65%. The  left ventricle has normal function. The left ventricle has no regional  wall motion abnormalities. Left ventricular diastolic parameters are  indeterminate.   2. Right ventricular systolic function is normal. The  right ventricular  size is normal.  3. Left atrial size was severely dilated.   4. Right atrial size was mildly dilated.   5. The mitral valve is degenerative. Mild mitral valve regurgitation. No  evidence of mitral stenosis.   6. The aortic valve is tricuspid. Aortic valve regurgitation is mild to  moderate. Mild aortic valve stenosis.   7. The inferior vena cava is normal in size with greater than 50%  respiratory variability, suggesting right atrial pressure of 3 mmHg.   RUQ U/S 2/15 1. Both the gallbladder and the CBD appear mildly distended with sludge but no stone identified. No intrahepatic biliary ductal dilatation to strongly suggest acute bile duct obstruction. Some gallbladder wall thickening, although no sonographic Murphy sign elicited to strongly suggest acalculous cholecystitis. 2. If abdominal pain continues then a nuclear medicine hepatobiliary scan would be valuable to exclude acute CBD or cystic duct Obstruction.  2/25 BLE DVT US >> neg 2/25 LUE DVT US >> neg  2/25 CT abd/ pelvis >> 1. Small bilateral pleural effusions. 2. Cardiomegaly with diffuse atherosclerosis of the coronary vessels. 3. Cholelithiasis without cholecystitis. 4. Bilateral renal atrophy consistent with end-stage renal disease. Stable indeterminate 1.2 cm lesion left kidney. 5. High attenuation material within the bladder lumen may be related to excretion of previously administered contrast. Please correlate with urinalysis. 6. Trace simple appearing pelvic free fluid. 7. Stable endoluminal stent graft within the aorta and bilateral common iliac arteries. Evaluation of the vascular lumen is limited without contrast. 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  Micro Data:  2/14 respiratory viral panel>> negative 2/14 blood cultures x2>> Citrobacter, sensitivities below 2/15 UCx Citrobacter pansensitive 2/25 BCx2 >>NGTD  Antimicrobials:  Flagyl 2/14 only Cefepime 2/14 >  2/15 Vancomycin 2/14 > 2/15 Meropenem 2/15 > 2/16 Ceftriaxone 2/16 > 2/22 Cefazolin 2/23 > 2/27 Cefepime 3/10 >>  Vancomycin 3/10 >>   Interim history/subjective:  S/p cardiac arrest Weaned off epi. Remains on levo, vaso and neo with good SBP. Responds to pain.  Yesterday Palliative discussed [place] with wife.  Objective   Blood pressure (!) 104/45, pulse 91, temperature 98.7 F (37.1 C), temperature source Axillary, resp. rate (!) 22, height 6\' 1"  (1.854 m), weight 93.3 kg, SpO2 100 %.    Vent Mode: PRVC FiO2 (%):  [40 %-70 %] 50 % Set Rate:  [14 bmp] 14 bmp Vt Set:  [640 mL] 640 mL PEEP:  [5 cmH20] 5 cmH20 Plateau Pressure:  [20 cmH20-22 cmH20] 22 cmH20   Intake/Output Summary (Last 24 hours) at 07/14/2019 1035 Last data filed at 07/14/2019 1000 Gross per 24 hour  Intake 4679 ml  Output 1350 ml  Net 3329 ml   Filed Weights   07/11/19 1038 07/13/19 0500 07/14/19 0500  Weight: 98.9 kg 89.1 kg 93.3 kg   Physical Exam: General: Critically ill-appearing, grimaces to pain HENT: [place], AT, ETT in place Eyes: EOMI, no scleral icterus Respiratory: Diminished breath sound bilaterally. No crackles, wheezing or rales Cardiovascular: RRR, -M/R/G, no JVD GI: BS+, soft, nontender Extremities: Trace pedal edema, -tenderness Neuro: Does not open eyes to voice or touch. Grimaces to pain. Moves extremities x 4 spontaneously. Does not follow commands. GU: Foley in place  Resolved Hospital Problem list   Septic Shock 2/2 to Citrobacter bacteremia  Acute Metabolic encephalopathy Pyocystitis -resolved, no evidence of colovesical fistula Hypokalemia Hypophosphatemia  Assessment & Plan:   Acute encephalopathy secondary to critical illness Interactive post- arrest P: Supportive management  Acute respiratory failure, principally due to cardiac arrest, consider  also contribution volume overload from cardiorenal syndrome and Pseudomonas pneumonia P: Full support. Wean FIO2 and PEEP as  tolerated Start WUA/SBT tomorrow Extubation precluded by mental status and hemodynamic instability Continue Cefepime. F/u sensitivities VAP  Septic shock, superimposed on chronic hypotension. Initially concerned for hemorrhagic shock for low hemoglobin. No evidence of bleed after transfusion -component of chronic hypotnesion, on midodrine  -has had acuate intermittent hypotension inpatient, difficulty pulling fluid during iHD P: Off epi Wean off levo, neo and vasopressin for goal SBP >90 Start stress dose steroids Antibiotics as above No further blood transfusions, trend CBC and transfuse for Hg goal >7 Remains on midodrine CT C/A/P  ESRD, has been transitioned to IHD but associated hypotension.  P: Nephrology following. Consider restart CRRT if clinically improves  Afib - new onset inpatient, CHADSVASC2 score 3 - not candidate for anticoag with severe thrombocytopenia P:  Continue amiodarone per tube Required cardioversion while unstable 3/9.  No plans for repeat DCCV based on discussions with family Continue telemetry monitoring  Severe thrombocytopenia - worsening Acute blood loss anemia - stable S/p PRBC x 3 on 3/10 and 3/4 P:  Check CBC now Transfuse for Hg goal >7 and Plt >10 CT C/A/P to rule out retroperitoneal bleed  Protein calorie malnutrition, dysphagia Immobility  Deconditioning  P:  Tube feeding as ordered via core track.  Would defer discussions regarding PEG until we determine whether he can recover from acute events Appreciate ongoing palliative care discussions  Celiac and superior mesenteric artery aneurysms -On CT scanning 05/2019 aneurysmal dilatation of bilateral common iliac arteries again noted. Left measures 3.5 cm in diameter, R 3.3 cm.  P:  OP vascular follow up   Left kidney mass P:  Will need OP follow up   Best practice:  Diet: TF Pain/Anxiety/Delirium protocol (if indicated): N/A VAP protocol (if indicated): N/A DVT prophylaxis:  SCDs GI prophylaxis: PPI Glucose control: CBG q 4  Mobility: PT/OR Code Status: Full Family Communication: Wife updated 3/10. Will need to address [place] today Disposition: ICU  The patient is critically ill with multiple organ systems failure and requires high complexity decision making for assessment and support, frequent evaluation and titration of therapies, application of advanced monitoring technologies and extensive interpretation of multiple databases.   Critical Care Time devoted to patient care services described in this note is 42 Minutes.  Rodman Pickle, M.D. [facility] Pulmonary/Critical Care Medicine 07/14/2019 10:35 AM   Please see Amion for pager number to reach on-call Pulmonary and Critical Care Team.

## 2019-07-14 NOTE — Progress Notes (Signed)
eLink Physician-Brief Progress Note Patient Name: Gregory [name] DOB: [birth year] MRN: [id number]   Date of Service  07/14/2019  HPI/Events of Note  Asked to review Chest CT Scan:  LEFT main bronchus is completely filled with hypodense material (fluid, debris and/or extensive mucous plugging). Associated complete airspace collapse on the LEFT and moderate-sized LEFT pleural effusion.  eICU Interventions  Will notify PCCM ground team of findings.     Intervention Category Major Interventions: Other:  Lysle Dingwall 07/14/2019, 7:32 PM

## 2019-07-14 NOTE — Progress Notes (Signed)
eLink notified of CT chest abdomen results that Dr. Loanne Drilling ordered on day shift.

## 2019-07-15 ENCOUNTER — Inpatient Hospital Stay (HOSPITAL_COMMUNITY): Payer: Medicare Other

## 2019-07-15 DIAGNOSIS — I340 Nonrheumatic mitral (valve) insufficiency: Secondary | ICD-10-CM

## 2019-07-15 DIAGNOSIS — I350 Nonrheumatic aortic (valve) stenosis: Secondary | ICD-10-CM

## 2019-07-15 DIAGNOSIS — I351 Nonrheumatic aortic (valve) insufficiency: Secondary | ICD-10-CM

## 2019-07-15 LAB — GLUCOSE, CAPILLARY
Glucose-Capillary: 129 mg/dL — ABNORMAL HIGH (ref 70–99)
Glucose-Capillary: 156 mg/dL — ABNORMAL HIGH (ref 70–99)
Glucose-Capillary: 169 mg/dL — ABNORMAL HIGH (ref 70–99)

## 2019-07-15 LAB — CBC
HCT: 25.8 % — ABNORMAL LOW (ref 39.0–52.0)
Hemoglobin: 8.1 g/dL — ABNORMAL LOW (ref 13.0–17.0)
MCH: 30.8 pg (ref 26.0–34.0)
MCHC: 31.4 g/dL (ref 30.0–36.0)
MCV: 98.1 fL (ref 80.0–100.0)
Platelets: 47 10*3/uL — ABNORMAL LOW (ref 150–400)
RBC: 2.63 MIL/uL — ABNORMAL LOW (ref 4.22–5.81)
RDW: 19.9 % — ABNORMAL HIGH (ref 11.5–15.5)
WBC: 19.2 10*3/uL — ABNORMAL HIGH (ref 4.0–10.5)
nRBC: 0.6 % — ABNORMAL HIGH (ref 0.0–0.2)

## 2019-07-15 LAB — COMPREHENSIVE METABOLIC PANEL
ALT: 859 U/L — ABNORMAL HIGH (ref 0–44)
AST: 328 U/L — ABNORMAL HIGH (ref 15–41)
Albumin: 1.9 g/dL — ABNORMAL LOW (ref 3.5–5.0)
Alkaline Phosphatase: 109 U/L (ref 38–126)
Anion gap: 15 (ref 5–15)
BUN: 159 mg/dL — ABNORMAL HIGH (ref 8–23)
CO2: 16 mmol/L — ABNORMAL LOW (ref 22–32)
Calcium: 8 mg/dL — ABNORMAL LOW (ref 8.9–10.3)
Chloride: 110 mmol/L (ref 98–111)
Creatinine, Ser: 5.25 mg/dL — ABNORMAL HIGH (ref 0.61–1.24)
GFR calc Af Amer: 12 mL/min — ABNORMAL LOW (ref >60)
GFR calc non Af Amer: 10 mL/min — ABNORMAL LOW (ref >60)
Glucose, Bld: 162 mg/dL — ABNORMAL HIGH (ref 70–99)
Potassium: 4.1 mmol/L (ref 3.5–5.1)
Sodium: 141 mmol/L (ref 135–145)
Total Bilirubin: 1 mg/dL (ref 0.3–1.2)
Total Protein: 3.9 g/dL — ABNORMAL LOW (ref 6.5–8.1)

## 2019-07-15 LAB — POCT I-STAT 7, (LYTES, BLD GAS, ICA,H+H)
Acid-base deficit: 11 mmol/L — ABNORMAL HIGH (ref 0.0–2.0)
Bicarbonate: 16 mmol/L — ABNORMAL LOW (ref 20.0–28.0)
Calcium, Ion: 1.16 mmol/L (ref 1.15–1.40)
HCT: 26 % — ABNORMAL LOW (ref 39.0–52.0)
Hemoglobin: 8.8 g/dL — ABNORMAL LOW (ref 13.0–17.0)
O2 Saturation: 98 %
Patient temperature: 97.4
Potassium: 4.2 mmol/L (ref 3.5–5.1)
Sodium: 139 mmol/L (ref 135–145)
TCO2: 17 mmol/L — ABNORMAL LOW (ref 22–32)
pCO2 arterial: 37 mmHg (ref 32.0–48.0)
pH, Arterial: 7.24 — ABNORMAL LOW (ref 7.350–7.450)
pO2, Arterial: 116 mmHg — ABNORMAL HIGH (ref 83.0–108.0)

## 2019-07-15 LAB — POCT I-STAT EG7
Acid-base deficit: 10 mmol/L — ABNORMAL HIGH (ref 0.0–2.0)
Bicarbonate: 16 mmol/L — ABNORMAL LOW (ref 20.0–28.0)
Calcium, Ion: 1.2 mmol/L (ref 1.15–1.40)
HCT: 23 % — ABNORMAL LOW (ref 39.0–52.0)
Hemoglobin: 7.8 g/dL — ABNORMAL LOW (ref 13.0–17.0)
O2 Saturation: 79 %
Patient temperature: 98.6
Potassium: 3.7 mmol/L (ref 3.5–5.1)
Sodium: 140 mmol/L (ref 135–145)
TCO2: 17 mmol/L — ABNORMAL LOW (ref 22–32)
pCO2, Ven: 35.2 mmHg — ABNORMAL LOW (ref 44.0–60.0)
pH, Ven: 7.266 (ref 7.250–7.430)
pO2, Ven: 49 mmHg — ABNORMAL HIGH (ref 32.0–45.0)

## 2019-07-15 LAB — ECHOCARDIOGRAM LIMITED
Height: 73 in
Weight: 3308.66 oz

## 2019-07-15 LAB — RENAL FUNCTION PANEL
Albumin: 2 g/dL — ABNORMAL LOW (ref 3.5–5.0)
Anion gap: 15 (ref 5–15)
BUN: 135 mg/dL — ABNORMAL HIGH (ref 8–23)
CO2: 16 mmol/L — ABNORMAL LOW (ref 22–32)
Calcium: 7.9 mg/dL — ABNORMAL LOW (ref 8.9–10.3)
Chloride: 110 mmol/L (ref 98–111)
Creatinine, Ser: 4.1 mg/dL — ABNORMAL HIGH (ref 0.61–1.24)
GFR calc Af Amer: 16 mL/min — ABNORMAL LOW (ref >60)
GFR calc non Af Amer: 14 mL/min — ABNORMAL LOW (ref >60)
Glucose, Bld: 187 mg/dL — ABNORMAL HIGH (ref 70–99)
Phosphorus: 5.1 mg/dL — ABNORMAL HIGH (ref 2.5–4.6)
Potassium: 3.9 mmol/L (ref 3.5–5.1)
Sodium: 141 mmol/L (ref 135–145)

## 2019-07-15 LAB — PROTIME-INR
INR: 1.7 — ABNORMAL HIGH (ref 0.8–1.2)
Prothrombin Time: 20 seconds — ABNORMAL HIGH (ref 11.4–15.2)

## 2019-07-15 LAB — MAGNESIUM: Magnesium: 2.5 mg/dL — ABNORMAL HIGH (ref 1.7–2.4)

## 2019-07-15 MED ORDER — HEPARIN SODIUM (PORCINE) 1000 UNIT/ML DIALYSIS
1000.0000 [IU] | INTRAMUSCULAR | Status: DC | PRN
  Administered 2019-07-15 (×2): 1200 [IU] via INTRAVENOUS_CENTRAL
  Filled 2019-07-15 (×3): qty 6

## 2019-07-15 MED ORDER — ALTEPLASE 2 MG IJ SOLR: 2.0000 mg | Freq: Once | INTRAMUSCULAR | Status: DC | PRN

## 2019-07-15 MED ORDER — "THROMBI-PAD 3""X3"" EX PADS"
1.0000 | MEDICATED_PAD | Freq: Once | CUTANEOUS | Status: DC
  Filled 2019-07-15: qty 1

## 2019-07-15 MED ORDER — FAMOTIDINE 20 MG PO TABS
10.0000 mg | ORAL_TABLET | Freq: Every day | ORAL | Status: DC
  Administered 2019-07-15: 10 mg

## 2019-07-15 MED ORDER — FENTANYL CITRATE (PF) 100 MCG/2ML IJ SOLN
25.0000 ug | INTRAMUSCULAR | Status: DC | PRN
  Administered 2019-07-15 (×2): 50 ug via INTRAVENOUS
  Filled 2019-07-15 (×2): qty 2

## 2019-07-15 MED ORDER — HYDROCORTISONE 20 MG PO TABS
50.0000 mg | ORAL_TABLET | Freq: Four times a day (QID) | ORAL | Status: DC
  Administered 2019-07-15 (×2): 50 mg
  Filled 2019-07-15 (×4): qty 1

## 2019-07-15 MED ORDER — AMIODARONE HCL 200 MG PO TABS
200.0000 mg | ORAL_TABLET | Freq: Two times a day (BID) | ORAL | Status: DC
  Administered 2019-07-15: 200 mg
  Filled 2019-07-15: qty 1

## 2019-07-15 MED ORDER — MIDAZOLAM HCL 2 MG/2ML IJ SOLN
2.0000 mg | INTRAMUSCULAR | Status: DC | PRN
  Filled 2019-07-15: qty 2

## 2019-07-15 MED ORDER — ACETAMINOPHEN 650 MG RE SUPP: 650.0000 mg | Freq: Four times a day (QID) | RECTAL | Status: DC | PRN

## 2019-07-15 MED ORDER — FENTANYL CITRATE (PF) 100 MCG/2ML IJ SOLN: 25.0000 ug | INTRAMUSCULAR | Status: DC | PRN

## 2019-07-15 MED ORDER — FENTANYL CITRATE (PF) 100 MCG/2ML IJ SOLN
25.0000 ug | INTRAMUSCULAR | Status: DC | PRN
  Filled 2019-07-15: qty 2

## 2019-07-15 MED ORDER — FENTANYL CITRATE (PF) 100 MCG/2ML IJ SOLN
INTRAMUSCULAR | Status: AC
  Filled 2019-07-15: qty 2

## 2019-07-15 MED ORDER — MIDODRINE HCL 5 MG PO TABS
20.0000 mg | ORAL_TABLET | Freq: Three times a day (TID) | ORAL | Status: DC
  Administered 2019-07-15 (×2): 20 mg
  Filled 2019-07-15 (×2): qty 4

## 2019-07-15 MED ORDER — FENTANYL CITRATE (PF) 100 MCG/2ML IJ SOLN
50.0000 ug | Freq: Once | INTRAMUSCULAR | Status: AC
  Administered 2019-07-15: 50 ug via INTRAVENOUS

## 2019-07-15 MED ORDER — PRISMASOL BGK 4/2.5 32-4-2.5 MEQ/L REPLACEMENT SOLN
Status: DC
  Filled 2019-07-15: qty 5000

## 2019-07-15 MED ORDER — B COMPLEX-C PO TABS: 1.0000 | ORAL_TABLET | Freq: Every day | ORAL | Status: DC

## 2019-07-15 MED ORDER — SODIUM BICARBONATE 8.4 % IV SOLN
50.0000 meq | Freq: Once | INTRAVENOUS | Status: AC
  Administered 2019-07-15: 50 meq via INTRAVENOUS
  Filled 2019-07-15: qty 50

## 2019-07-15 MED ORDER — POLYVINYL ALCOHOL 1.4 % OP SOLN
1.0000 [drp] | Freq: Four times a day (QID) | OPHTHALMIC | Status: DC | PRN
  Filled 2019-07-15: qty 15

## 2019-07-15 MED ORDER — ACETAMINOPHEN 325 MG PO TABS: 650.0000 mg | ORAL_TABLET | Freq: Four times a day (QID) | ORAL | Status: DC | PRN

## 2019-07-15 MED ORDER — GLYCOPYRROLATE 0.2 MG/ML IJ SOLN: 0.2000 mg | INTRAMUSCULAR | Status: DC | PRN

## 2019-07-15 MED ORDER — GLYCOPYRROLATE 1 MG PO TABS: 1.0000 mg | ORAL_TABLET | ORAL | Status: DC | PRN

## 2019-07-15 MED ORDER — TRAZODONE HCL 50 MG PO TABS: 50.0000 mg | ORAL_TABLET | Freq: Every evening | ORAL | Status: DC | PRN

## 2019-07-15 MED ORDER — SODIUM CHLORIDE 0.9 % IV SOLN
2.0000 g | Freq: Two times a day (BID) | INTRAVENOUS | Status: DC
  Filled 2019-07-15: qty 2

## 2019-07-15 MED ORDER — PRISMASOL BGK 4/2.5 32-4-2.5 MEQ/L REPLACEMENT SOLN
Status: DC
  Filled 2019-07-15 (×2): qty 5000

## 2019-07-15 MED ORDER — LOPERAMIDE HCL 1 MG/7.5ML PO SUSP
2.0000 mg | ORAL | Status: DC | PRN
  Filled 2019-07-15: qty 15

## 2019-07-15 MED ORDER — DIPHENHYDRAMINE HCL 50 MG/ML IJ SOLN: 25.0000 mg | INTRAMUSCULAR | Status: DC | PRN

## 2019-07-15 MED ORDER — PRISMASOL BGK 4/2.5 32-4-2.5 MEQ/L IV SOLN
INTRAVENOUS | Status: DC
  Filled 2019-07-15 (×9): qty 5000

## 2019-07-15 MED ORDER — HALOPERIDOL LACTATE 5 MG/ML IJ SOLN: 2.5000 mg | INTRAMUSCULAR | Status: DC | PRN

## 2019-07-15 MED ORDER — SODIUM CHLORIDE 0.9 % FOR CRRT
INTRAVENOUS_CENTRAL | Status: DC | PRN
  Filled 2019-07-15: qty 1000

## 2019-07-16 DIAGNOSIS — R7881 Bacteremia: Secondary | ICD-10-CM

## 2019-07-16 DIAGNOSIS — A498 Other bacterial infections of unspecified site: Secondary | ICD-10-CM

## 2019-07-16 DIAGNOSIS — B9689 Other specified bacterial agents as the cause of diseases classified elsewhere: Secondary | ICD-10-CM

## 2019-07-16 DIAGNOSIS — N179 Acute kidney failure, unspecified: Secondary | ICD-10-CM

## 2019-07-16 DIAGNOSIS — Z515 Encounter for palliative care: Secondary | ICD-10-CM

## 2019-07-16 LAB — CULTURE, RESPIRATORY W GRAM STAIN

## 2019-07-18 LAB — GLUCOSE, CAPILLARY
Glucose-Capillary: <10 mg/dL — CL (ref 70–99)
Glucose-Capillary: 14 mg/dL — CL (ref 70–99)

## 2019-07-18 LAB — CULTURE, BLOOD (ROUTINE X 2)
Culture: NO GROWTH
Culture: NO GROWTH

## 2019-08-04 NOTE — Progress Notes (Signed)
PCCM Interval Note  Notified by staff regarding worsening hypotension despite vasopressor support. Mother and son at bedside. Updated on his clinical deterioration despite aggressive management. I suspect he will likely pass tonight or within the next 24 hours given his critical condition. Wife has requested for additional family to be present at bedside. Patient currently DNR.  Rodman Pickle, M.D. [facility] Pulmonary/Critical Care Medicine 07/29/2019 5:53 PM

## 2019-08-04 NOTE — Progress Notes (Signed)
Pts blood returned from CRRT and therapy ended per MD order. Visitation liberalized and no further escalation of care per MD.

## 2019-08-04 NOTE — Progress Notes (Signed)
Pt's BPs hanging around 41N systolic despite increasing Levo and Neo gtts. Unable to pull any volume off pt. MD Loanne Drilling notified. Order received to increase levo max to 100 and not attempt to pull volume off pt unless he is hemodynamically stable.

## 2019-08-04 NOTE — Progress Notes (Signed)
[facility] Progress Note  Subjective: net 2.4 L +  Yesterday. Epi gtt remains off, neo gtt down, levo gtt still high, +vaso gtt.   Vitals:   2019-08-01 1015 August 01, 2019 1030 August 01, 2019 1134 August 01, 2019 1200  BP: (!) 93/55 (!) 99/53 (!) 113/41   Pulse: (!) 48 96 98   Resp: (!) 21 17 (!) 21   Temp:    98.4 F (36.9 C)  TempSrc:    Axillary  SpO2: 100% 93% 95%   Weight:      Height:        Exam: General: on vent, not responsive Heart: regular rhythm, no murmurs, rubs or gallops Lungs: coarse post rales bilat Abdomen: Soft, non-tender, non-distended, +BS Extremities: sig + 2+ diffuse edema x 4 ext  Dialysis Access: RUE AVF +bruit Neuro - as above    Dialysis: [place] MWF 3h 72mn 400/800 73kg 2/3.5Ca bath P4 AVF Hep 3300 No VDRA/ESA   Assessment/Plan: 1. ESRD: sp CRRT 2/15-2/21 then again 2/8-3/2 due to recurrent septic shock. [facility] developed progressive anasarca and then resp failure.  Went back to ICU for 3rd time on 3/09 and resumed CRRT, 2hrs later pt had PEA arrest and severe post-arrest shock. Remains on 3 pressors. Family wishes for cont'd medical Rx. Will resume CRRT, pull 50 - 100 /hr as tolerated.  2.  Resp failure: pulm edema/ cardiorenal +/- PNA, on vent. Per CCM.  3. Citrobacter sepsis: admitting diagnosis, seen by urologist and thought to be pyocystis, rx'd w/ Foley drainage + course of antibiotics. Foley out now 4. HTN/volume:  BP soft on midodrine 268mTID, hydrocortisone. Diffuse edema on exam.  5. Anemia: Hgb 8.2. On aranesp 6035mweekly. Tsat 12%, started on IV Fe. Transfuse PRN.  6.  Thrombocytopenia - plts up to 43k 7. Secondary hyperparathyroidism:  Phos okay , not on binder. Corrected calcium 9.5. Not on VDRA.  8. Nutrition:   Alb 2.0. Renal diet with fluid restriction.  9. Dispo: partial code    Rob Shakoya Gilmore 3/129-Mar-2021:39 PM   Recent Labs  Lab 07/12/19 0546 07/12/19 0546 07/12/19 1430  07/12/19 2053 07/14/19 0539 07/14/19 1234 07/06/27/2158 03/03-29-2133  K 3.7   < > 3.1*   < > 3.5  --  3.7 4.1  BUN 96*   < > 108*   < > 128*  --   --  159*  CREATININE 3.46*   < > 3.74*   < > 4.26*  --   --  5.25*  CALCIUM 8.4*   < > 8.0*   < > 7.7*  --   --  8.0*  PHOS 3.7  --  3.9  --   --   --   --   --   HGB 8.3*   < > 7.3*   < > 8.5*   < > 7.8* 8.1*   < > = values in this interval not displayed.   Inpatient medications: . amiodarone  200 mg Per Tube BID  . [START ON 07/16/2019] B-complex with vitamin C  1 tablet Per Tube Daily  . chlorhexidine gluconate (MEDLINE KIT)  15 mL Mouth Rinse BID  . darbepoetin (ARANESP) injection - NON-DIALYSIS  60 mcg Subcutaneous Q Fri-1800  . famotidine  10 mg Per Tube Daily  . feeding supplement (PRO-STAT SUGAR FREE 64)  60 mL Per Tube TID  . feeding supplement (VITAL 1.5 CAL)  1,000 mL Per Tube Q24H  . hydrocortisone  50 mg  Per Tube Q6H  . mouth rinse  15 mL Mouth Rinse 10 times per day  . midodrine  20 mg Per Tube TID WC  . sodium chloride flush  10-40 mL Intracatheter Q12H  . Thrombi-Pad  1 each Topical Once   .  prismasol BGK 4/2.5 400 mL/hr at 07/19/19 1230  .  prismasol BGK 4/2.5 200 mL/hr at 2019/07/19 1230  . sodium chloride Stopped (06/30/19 1303)  . ceFEPime (MAXIPIME) IV    . fentaNYL infusion INTRAVENOUS Stopped (07/13/19 1950)  . ferric gluconate (FERRLECIT/NULECIT) IV 125 mg (19-Jul-2019 1513)  . norepinephrine (LEVOPHED) Adult infusion 55 mcg/min (July 19, 2019 1500)  . phenylephrine (NEO-SYNEPHRINE) Adult infusion 80 mcg/min (07-19-19 1500)  . prismasol BGK 4/2.5 2,000 mL/hr at 19-Jul-2019 1230  . vasopressin (PITRESSIN) infusion - *FOR SHOCK* 0.04 Units/min (07-19-19 1500)   alteplase, fentaNYL (SUBLIMAZE) injection, fentaNYL (SUBLIMAZE) injection, heparin, loperamide, loperamide HCl, ondansetron (ZOFRAN) IV, phenol, sodium chloride, sodium chloride flush, traZODone

## 2019-08-04 NOTE — Progress Notes (Signed)
OT Cancellation Note  Patient Details Name: Gregory [name] MRN: [id number] DOB: [birth year]   Cancelled Treatment:    Reason Eval/Treat Not Completed: Medical issues which prohibited therapy. Pt intubated and with mutiple medical issues. Will check back next week for appropriateness  Nilsa Nutting., OTR/L Acute Rehabilitation Services Pager [phone] Office [place], [place] 2019-07-21, 5:56 AM

## 2019-08-04 NOTE — Progress Notes (Signed)
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  He was transferred out of ICU and to [facility] on 2/23.  Called 2/25 for recurrent hypotension, not responsive to fluids/ albumin therefore was transferred back to ICU for vasopressor support.     Past Medical History   has a past medical history of Anemia in chronic kidney disease, Arthritis, CHF (congestive heart failure) ([place]), Chronic back pain, End stage renal disease ([place]), GERD (gastroesophageal reflux disease), Gout, Heart failure with reduced ejection fraction ([place]), and Hypertension. celiac and superior mesenteric artery aneurysms  Significant Hospital Events   2/14 Admit to [facility] 2/14 He developed SVT, was hemodynamically unstable, treated initially with adenosine but ultimately required DCCV around 715. Has remained hypotensive, received albumin and now another 2 L IV fluid resuscitation (5 L total). Interacting but confused, still not back to baseline per wife at bedside 2/15 Stool-like produced from in-out urinary cath 2/16 Urology consult, confirmed pyocystitis and drained bladder 2/22 out of ICU to [facility] on 2/23 2/25 PCCM re-consult given hypotension 2/26 Remains on vasopressors support  2/28 CRRT initiated 3/1 Neo at 150 mcg/min , plts 6K >> transfused 3/4 back on TRH, out of ICU 3/9 back to ICU for CRRT per nephrology   Consults:  Nephrology 2/14 Urology 2/16 Hematology 2/22 Palliative care 2/26  PCCM 3/9  Procedures:  2/14 left IJ HD cath  insertion >3/5 2/16 Bedside bladder aspiration 2/25 foley >> 2/26 (no output or pus noted)  Significant Diagnostic Tests:  2/14 CXR>>Cardiomegaly.  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 Chronic elevation of the left hemidiaphragm with chronic volume loss at the left lung base.  2/14 CT Abd 1. No acute pulmonary embolism. 2. Small bilateral pleural effusions with adjacent atelectasis. 3. Dilated main pulmonary artery, which may be secondary to pulmonary arterial hypertension. 4. Interval development of wedge-shaped defects involving the spleen, which may represent splenic infarcts. 5. Cholelithiasis with mild gallbladder wall thickening and pericholecystic free fluid, which may be secondary to underlying liver disease. If there is clinical concern for acute cholecystitis, follow-up with ultrasound is recommended. 6. 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 7. 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 8. Additional chronic findings as detailed above. 9. Aortic Atherosclerosis (ICD10-I70.0).  2/16 CT Pelvis with Rectal contrast 1. Negative for colovesicular fistula. Rectal contrast only opacifies the colon. 2. Bladder wall thickening likely from chronic outlet obstruction. The prostate is enlarged. 3. Prominent anasarca.  CT Head 2/14 No acute intracranial finding. Atrophy. Chronic small-vessel ischemic changes. 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  Echo 2/14 . Left ventricular ejection fraction, by estimation, is 60 to 65%. The  left ventricle has normal function. The left ventricle has no regional  wall motion abnormalities. Left ventricular diastolic parameters are  indeterminate.   2. Right ventricular systolic function is normal. The  right ventricular  size is normal.  3. Left atrial size was severely dilated.   4. Right atrial size was mildly dilated.   5. The mitral valve is degenerative. Mild mitral valve regurgitation. No  evidence of mitral stenosis.   6. The aortic valve is tricuspid. Aortic valve regurgitation is mild to  moderate. Mild aortic valve stenosis.   7. The inferior vena cava is normal in size with greater than 50%  respiratory variability, suggesting right atrial pressure of 3 mmHg.   RUQ U/S 2/15 1. Both the gallbladder and the CBD appear mildly distended with sludge but no stone identified. No intrahepatic biliary ductal dilatation to strongly suggest acute bile duct obstruction. Some gallbladder wall thickening, although no sonographic Murphy sign elicited to strongly suggest acalculous cholecystitis. 2. If abdominal pain continues then a nuclear medicine hepatobiliary scan would be valuable to exclude acute CBD or cystic duct Obstruction.  2/25 BLE DVT US >> neg 2/25 LUE DVT US >> neg  2/25 CT abd/ pelvis >> 1. Small bilateral pleural effusions. 2. Cardiomegaly with diffuse atherosclerosis of the coronary vessels. 3. Cholelithiasis without cholecystitis. 4. Bilateral renal atrophy consistent with end-stage renal disease. Stable indeterminate 1.2 cm lesion left kidney. 5. High attenuation material within the bladder lumen may be related to excretion of previously administered contrast. Please correlate with urinalysis. 6. Trace simple appearing pelvic free fluid. 7. Stable endoluminal stent graft within the aorta and bilateral common iliac arteries. Evaluation of the vascular lumen is limited without contrast. 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  CT Chest/Abdomen/Pelvis 3/11 1. LEFT main bronchus is completely filled with hypodense material (fluid, debris and/or extensive mucous plugging). Associated complete airspace collapse on the LEFT and moderate-sized  LEFT pleural effusion. 2. Patchy nodular and ground-glass opacities throughout the RIGHT lung. Differential includes atypical pneumonias such as viral or fungal, edema related to volume overload/CHF, pulmonary hemorrhage, and aspiration. COVID-19 pneumonia could have this appearance. Additional moderate-sized RIGHT pleural effusion. 3. Endotracheal tube is well positioned with tip above the level of the carina. 4. Cardiomegaly. 5. Moderate amount of free fluid within the abdomen and pelvis. The density measurements of this fluid are not compatible with acute hemorrhage. No abscess collection seen. No free intraperitoneal air. 6. Numerous bilateral rib fractures, as described above, most of which are nondisplaced or minimally displaced. 7. Marked anasarca. 8. Atrophic kidneys. 9. Additional chronic/incidental findings detailed above.  Micro Data:  2/14 respiratory viral panel>> negative 2/14 blood cultures x2>> Citrobacter, sensitivities below 2/15 UCx Citrobacter pansensitive 2/25 BCx2 >>NGTD  Antimicrobials:  Flagyl 2/14 only Cefepime 2/14 > 2/15 Vancomycin 2/14 > 2/15 Meropenem 2/15 > 2/16 Ceftriaxone 2/16 > 2/22 Cefazolin 2/23 > 2/27 Cefepime 3/10 >>  Vancomycin 3/10 >>   Interim history/subjective:  Yesterday, spoke with wife and son. Family wishes to remain as aggressive with care as possible including pursuing CRRT if this remains an option.  Overnight, improved blood pressures. Neo initially weaned off. Arterial line lost. Required re-initiation of Neo and increase in Levophed. RN expresses need for access.  Objective   Blood pressure (!) 89/36, pulse (!) 44, temperature 99.8 F (37.7 C), temperature source Axillary, resp. rate (!) 25, height 6\' 1"  (1.854 m), weight 93.8 kg, SpO2 100 %.    Vent Mode: PRVC FiO2 (%):  [40 %-50 %] 40 % Set Rate:  [14 bmp] 14 bmp Vt Set:  [640 mL] 640 mL PEEP:  [5 cmH20] 5 cmH20 Plateau Pressure:  [21 cmH20-22 cmH20] 21 cmH20    Intake/Output Summary (  Last 24 hours) at 07-21-2019 0739 Last data filed at Jul 21, 2019 0700 Gross per 24 hour  Intake 2683.46 ml  Output 275 ml  Net 2408.46 ml   Filed Weights   07/13/19 0500 07/14/19 0500 07/21/2019 0500  Weight: 89.1 kg 93.3 kg 93.8 kg   Physical Exam: General: Chronically ill-appearing, thin male, obtunded HENT: [place], AT, ETT in place Eyes: EOMI, no scleral icterus Respiratory: Coarse breath sounds on right. Minimal/absent on the left  No crackles, wheezing or rales Cardiovascular: RRR, -M/R/G, no JVD GI: BS+, soft, nontender Extremities:1+ pitting edema,-tenderness Neuro: Does not open eyes to voice or touch, PERRL, grimaces and withdraws to pain. Moves extremities x 4. GU: Foley in place  Resolved Hospital Problem list   Septic Shock 2/2 to Citrobacter bacteremia  Acute Metabolic encephalopathy Pyocystitis -resolved, no evidence of colovesical fistula Hypokalemia Hypophosphatemia  Assessment & Plan:   Acute encephalopathy secondary to critical illness Interactive post- arrest P: Supportive management  S/p PEA arrest ? Cardiogenic shock P: Continue vasopressor support. Keep levophed on and wean last. No central access except for vas cath. Will place CVC Obtain limited echocardiogram  Acute respiratory failure, principally due to cardiac arrest, consider also contribution volume overload from cardiorenal syndrome and Pseudomonas pneumonia P: Full support. Wean FIO2 and PEEP as tolerated Start WUA/SBT tomorrow Extubation precluded by mental status and hemodynamic instability Continue Cefepime. F/u sensitivities VAP  Septic shock, superimposed on chronic hypotension. Initially concerned for hemorrhagic shock for low hemoglobin. No evidence of bleed after transfusion. CT neg for bleed -component of chronic hypotnesion, on midodrine  -has had acuate intermittent hypotension inpatient, difficulty pulling fluid during iHD P: Off epi Obtain arterial  line for accurate hemodynamic monitoring. Wean levo, neo and vasopressin for goal SBP >90 No central access except for vas cath. Will place CVC Continue stress dose steroids Antibiotics as above No further blood transfusions, trend CBC and transfuse for Hg goal >7 Remains on midodrine  ESRD, has been transitioned to IHD but associated hypotension.  P: Nephrology following. Consider restart CRRT if clinically improves  Afib - new onset inpatient, CHADSVASC2 score 3 - not candidate for anticoag with severe thrombocytopenia P:  Continue amiodarone per tube Required cardioversion while unstable 3/9.  No plans for repeat DCCV based on discussions with family Continue telemetry monitoring  Severe thrombocytopenia - worsening Acute blood loss anemia - stable S/p PRBC x 3 on 3/10 and 3/4 P:  Check CBC now Transfuse for Hg goal >7 and Plt >10 CT C/A/P to rule out retroperitoneal bleed  Protein calorie malnutrition, dysphagia Immobility  Deconditioning  P:  Tube feeding as ordered via core track.  Would defer discussions regarding PEG until we determine whether he can recover from acute events Appreciate ongoing palliative care discussions  Celiac and superior mesenteric artery aneurysms -On CT scanning 05/2019 aneurysmal dilatation of bilateral common iliac arteries again noted. Left measures 3.5 cm in diameter, R 3.3 cm.  P:  OP vascular follow up   Left kidney mass P:  Will need OP follow up   Best practice:  Diet: TF Pain/Anxiety/Delirium protocol (if indicated): N/A VAP protocol (if indicated): N/A DVT prophylaxis: SCDs GI prophylaxis: PPI Glucose control: CBG q 4  Mobility: PT/OR Code Status: DNR (no chest compressions/no ACLS meds) Family Communication: Wife and son updated extensively yesterday on 3/11. Family agrees to DNR however wife expresses wishes to continue to be as aggressive with care as able.  Disposition: ICU  The patient is critically ill with  multiple organ systems failure and requires high complexity decision making for assessment and support, frequent evaluation and titration of therapies, application of advanced monitoring technologies and extensive interpretation of multiple databases.   Critical Care Time devoted to patient care services described in this note is 33 Minutes. This time reflects time of care of this signee Dr. Rodman Pickle.   Rodman Pickle, M.D. [facility] Pulmonary/Critical Care Medicine August 06, 2019 7:40 AM   Please see Amion for pager number to reach on-call Pulmonary and Critical Care Team.

## 2019-08-04 NOTE — Plan of Care (Signed)
PCCM Interval Note  Wife and adult son and daughter at bedside. Family awaiting patient's brother to arrive. Family agrees to start pain medication PRN for discomfort. But wishes to wait for patient's brother to arrive prior to withdrawal of care and compassionate extubation. Discussed plan with PM RN who will check in with family when ready to withdraw. If brother does not arrive, family agrees they may start withdrawal anyway.  Comfort care orders in place  Rodman Pickle, M.D. [facility] Pulmonary/Critical Care Medicine Jul 24, 2019 7:05 PM

## 2019-08-04 NOTE — Progress Notes (Signed)
   Palliative Medicine Inpatient Follow Up Note  In the early evening I met at bedside with Gregory [name] (wife). I had spoken to nursing who verbalized that Gregory [name] was unable to tolerate CRRT. He appeared physically distressed at this time. I talked to Gregory [name] about transitioning to comfort measures. We talked about how hard Wilford's body had fought up until this point. I was able to listen to her share with me the wonderful man that Canon is. She felt that she has done everything she could to give him "a chance". We discussed how often despite all medical measures our bodies can only tolerate so much. She asked about what comfort measures in the hospital look like. I shared with her that they involved removing all invasive interventions such as the tube he has for breathing and initiating medications to alleviate discomfort. She seemed to understand and agree with this though she wanted to wait for her son and daughter to come to bedside.  At 6:50 PM Larrys son and daughter came to bedside. Dr. Loanne Drilling and myself spoke to the patients family. They agreed that transitioning to more a comfort emphasis was appropriate. Orders were placed by primary team.   All questions answered and support provided.   Time Spent: 35 Greater than 50% of the time was spent in counseling and coordination of care ______________________________________________________________________________________ [place] Team Team Cell Phone: [phone] Please utilize secure chat with additional questions, if there is no response within 30 minutes please call the above phone number  Palliative Medicine Team providers are available by phone from 7am to 7pm daily and can be reached through the team cell phone.  Should this patient require assistance outside of these hours, please call the patient's attending physician.

## 2019-08-04 NOTE — Death Summary Note (Signed)
DEATH SUMMARY   Patient Details  Name: Gregory [name] MRN: [id number] DOB: 06/25/1950  Admission/Discharge Information   Admit Date:  June 21, 2019  Date of Death: Date of Death: July 18, 2019  Time of Death: Time of Death: Jul 28, 2001  Length of Stay: 01-Aug-2022  Referring Physician: Nicholos Johns, MD   Reason(s) for Hospitalization  Sepsis  Diagnoses  Preliminary cause of death: Multi-organ failure secondary to PEA arrest  Secondary Diagnoses (including complications and co-morbidities): Chronic hypotension, ESRD  Active Problems:   ESRD (end stage renal disease) ([place])   Septic shock ([place])   Protein-calorie malnutrition, severe   Thrombocytopenia ([place])   Sepsis due to undetermined organism ([place])   Goals of care, counseling/discussion   Palliative care by specialist   DNR (do not resuscitate) discussion   Hypotension   Acute respiratory failure with hypoxia ([place])   End-stage renal disease on hemodialysis ([place])   Advanced care planning/counseling discussion   Pulmonary edema   Terminal care   Bacteremia due to Enterobacter species   Citrobacter infection   Acute renal failure (ARF) [facility])   Brief Hospital Course (including significant findings, care, treatment, and services provided and events leading to death)  Gregory [name] is a 69 y.o. year old male with ESRD who presents with sepsis. Hospital course complicated septic shock secondary to enterobacter bacteremia and Citrobacter cystitis, acute encephalopathy, chronic hypotension, SVT/AFRVR, acute on chronic renal failure requiring CRRT, chronic thrombocytopenia.  On 3/9 he had PEA cardiac arrest with ROSC. Given his critical condition, family decision made to transition patient to DNR. Patient continued to require vasopressor support. His metabolic acidosis worsened despite CRRT. Patient's clinical condition continued to deteriorate with aggressive management. Palliative and primary team discussed goals of care and ultimately transitioned patient  to comfort care on 18-Jul-2022. He was compassionately extubated and expired on 07-28-2001.   Pertinent Labs and Studies  Significant Diagnostic Studies CT ABDOMEN PELVIS WO CONTRAST  Result Date: 07/14/2019 CLINICAL DATA:  Hypotension, shock. EXAM: CT CHEST, ABDOMEN AND PELVIS WITHOUT CONTRAST TECHNIQUE: Multidetector CT imaging of the chest, abdomen and pelvis was performed following the standard protocol without IV contrast. COMPARISON:  Chest x-ray from earlier same day. CT abdomenw dated 06/30/2019. FINDINGS: CT CHEST FINDINGS Cardiovascular: Cardiomegaly. Coronary artery calcifications. Heart is now shifted into the LEFT chest due to airspace collapse on the LEFT. No thoracic aortic aneurysm. Aortic atherosclerosis. Mediastinum/Nodes: Endotracheal tube is well positioned with tip above the level of the carina. Enteric tube passes into the stomach. No mass or enlarged lymph nodes appreciated within the mediastinum. Lungs/Pleura: LEFT main bronchus is completely filled with hypodense material (fluid, debris or extensive mucous plugging). z there is complete airspace collapse on the LEFT and moderate-sized LEFT pleural effusion. Additional moderate sized RIGHT pleural effusion. Patchy nodular and ground-glass opacities throughout the RIGHT lung. No pneumothorax. Musculoskeletal: Nondisplaced fractures of the posterior RIGHT ninth and tenth ribs. Displaced fractures of the LEFT anterior second through sixth ribs. Additional nondisplaced or minimally displaced fractures throughout the LEFT anterior-lateral ribs. Slightly displaced fractures of the RIGHT anterior sixth and seventh ribs. Additional slightly displaced fracture within the sternum. Mass like structure within the RIGHT upper extremity, at superficial depth, presumably related to patient's documented RIGHT arm AV fistula. CT ABDOMEN PELVIS FINDINGS Hepatobiliary: No focal liver abnormality identified. Gallbladder is decompressed. Pancreas: Portions of the  pancreas are poorly seen but no obvious pancreatic mass or peripancreatic inflammation. Spleen: Splenic cyst versus hemangioma. Otherwise unremarkable. Adrenals/Urinary Tract: Kidneys are atrophic. Adrenal glands are unremarkable.  Bladder is decompressed. Stomach/Bowel: No dilated large or small bowel loops. Stomach is unremarkable, partially decompressed. Vascular/Lymphatic: Aorta bi-iliac stent in place. No enlarged lymph nodes identified. Reproductive: Prostate is unremarkable. Other: Moderate amount of free fluid within the abdomen and pelvis. No free intraperitoneal air. Musculoskeletal: No acute or suspicious osseous finding within the abdomen or pelvis. Ill-defined fluid/edema throughout the subcutaneous soft tissues of the chest, abdomen and pelvis indicating extensive anasarca. IMPRESSION: 1. LEFT main bronchus is completely filled with hypodense material (fluid, debris and/or extensive mucous plugging). Associated complete airspace collapse on the LEFT and moderate-sized LEFT pleural effusion. 2. Patchy nodular and ground-glass opacities throughout the RIGHT lung. Differential includes atypical pneumonias such as viral or fungal, edema related to volume overload/CHF, pulmonary hemorrhage, and aspiration. COVID-19 pneumonia could have this appearance. Additional moderate-sized RIGHT pleural effusion. 3. Endotracheal tube is well positioned with tip above the level of the carina. 4. Cardiomegaly. 5. Moderate amount of free fluid within the abdomen and pelvis. The density measurements of this fluid are not compatible with acute hemorrhage. No abscess collection seen. No free intraperitoneal air. 6. Numerous bilateral rib fractures, as described above, most of which are nondisplaced or minimally displaced. 7. Marked anasarca. 8. Atrophic kidneys. 9. Additional chronic/incidental findings detailed above. Aortic Atherosclerosis (ICD10-I70.0). Electronically Signed   By: Franki Cabot M.D.   On: 07/14/2019 18:06    CT ABDOMEN PELVIS WO CONTRAST  Result Date: 06/30/2019 CLINICAL DATA:  Pyo cystitis, sepsis EXAM: CT ABDOMEN AND PELVIS WITHOUT CONTRAST TECHNIQUE: Multidetector CT imaging of the abdomen and pelvis was performed following the standard protocol without IV contrast. COMPARISON:  06/21/2019 FINDINGS: Lower chest: There are small bilateral pleural effusions. Minimal compressive lower lobe atelectasis bilaterally. Heart is enlarged without pericardial effusion. Prominent calcification of the mitral annulus with diffuse atherosclerosis of the coronary vessels. Hepatobiliary: Calcified gallstones are identified without cholecystitis. No focal liver abnormalities. No ductal dilation. Pancreas: Stable chronic pancreatic duct dilation. The pancreas is otherwise unremarkable. Spleen: Stable hypodensity posteromedial spleen which may reflect infarct based on previous imaging. Adrenals/Urinary Tract: Bilateral renal atrophy consistent with end-stage renal disease. Stable right renal cyst. Stable indeterminate 1.2 cm lesion left kidney image 36. Foley catheter is in place. High attenuation material within the bladder lumen, could be related to excretion of previously administered contrast. Please correlate with urinalysis. The adrenals are stable. Stomach/Bowel: No bowel obstruction or ileus. Normal appendix right lower quadrant. No bowel wall thickening or inflammatory changes. Vascular/Lymphatic: Stable endoluminal stent graft within the aorta and bilateral common iliac arteries. Stable stents extending into the right external and right internal iliac arteries. Evaluation of the vascular lumen is limited without contrast. 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 There is diffuse atherosclerosis. No pathologic adenopathy. Reproductive: Stable mild enlargement of the prostate. Other: Trace simple appearing pelvic free fluid. There is mild diffuse mesenteric edema. No free gas within the abdomen.  Musculoskeletal: No acute or destructive bony lesions. Stable multilevel lumbar spondylosis. Reconstructed images demonstrate no additional findings. IMPRESSION: 1. Small bilateral pleural effusions. 2. Cardiomegaly with diffuse atherosclerosis of the coronary vessels. 3. Cholelithiasis without cholecystitis. 4. Bilateral renal atrophy consistent with end-stage renal disease. Stable indeterminate 1.2 cm lesion left kidney. 5. High attenuation material within the bladder lumen may be related to excretion of previously administered contrast. Please correlate with urinalysis. 6. Trace simple appearing pelvic free fluid. 7. Stable endoluminal stent graft within the aorta and bilateral common iliac arteries. Evaluation of the vascular lumen is limited  without contrast. 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 Electronically Signed   By: Randa Ngo M.D.   On: 06/30/2019 19:01   DG Abd 1 View  Result Date: 07/01/2019 CLINICAL DATA:  Enteric catheter placement EXAM: ABDOMEN - 1 VIEW COMPARISON:  11/08/2014, 06/30/2019 FINDINGS: Frontal view of the lower chest and upper abdomen was performed. Enteric catheter tip projects over gastric body. Bowel gas pattern is unremarkable and stable. Endoluminal aortic stent graft again identified. Persistent left basilar consolidation and effusion. Diffuse calcification of the mitral annulus. IMPRESSION: 1. Enteric catheter overlying gastric body. Electronically Signed   By: Randa Ngo M.D.   On: 07/01/2019 19:04   CT Head Wo Contrast  Result Date: 06/19/2019 CLINICAL DATA:  Altered mental status, unknown etiology. EXAM: CT HEAD WITHOUT CONTRAST TECHNIQUE: Contiguous axial images were obtained from the base of the skull through the vertex without intravenous contrast. COMPARISON:  None. FINDINGS: Brain: Generalized age related brain volume loss. Chronic small-vessel ischemic changes of the cerebral hemispheric white matter. No sign of acute large  vessel territory infarction. No mass lesion, hemorrhage, hydrocephalus or extra-axial collection. 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 Skull: Normal Sinuses/Orbits: Mucosal thickening and a small amount of fluid in the left maxillary sinus, probably secondary to dental incursion. Other: None IMPRESSION: No acute intracranial finding. Atrophy. Chronic small-vessel ischemic changes. 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 Electronically Signed   By: Nelson Chimes M.D.   On: 06/19/2019 20:43   CT CHEST WO CONTRAST  Result Date: 07/14/2019 CLINICAL DATA:  Hypotension, shock. EXAM: CT CHEST, ABDOMEN AND PELVIS WITHOUT CONTRAST TECHNIQUE: Multidetector CT imaging of the chest, abdomen and pelvis was performed following the standard protocol without IV contrast. COMPARISON:  Chest x-ray from earlier same day. CT abdomenw dated 06/30/2019. FINDINGS: CT CHEST FINDINGS Cardiovascular: Cardiomegaly. Coronary artery calcifications. Heart is now shifted into the LEFT chest due to airspace collapse on the LEFT. No thoracic aortic aneurysm. Aortic atherosclerosis. Mediastinum/Nodes: Endotracheal tube is well positioned with tip above the level of the carina. Enteric tube passes into the stomach. No mass or enlarged lymph nodes appreciated within the mediastinum. Lungs/Pleura: LEFT main bronchus is completely filled with hypodense material (fluid, debris or extensive mucous plugging). z there is complete airspace collapse on the LEFT and moderate-sized LEFT pleural effusion. Additional moderate sized RIGHT pleural effusion. Patchy nodular and ground-glass opacities throughout the RIGHT lung. No pneumothorax. Musculoskeletal: Nondisplaced fractures of the posterior RIGHT  ninth and tenth ribs. Displaced fractures of the LEFT anterior second through sixth ribs. Additional nondisplaced or minimally displaced fractures throughout the LEFT anterior-lateral ribs. Slightly displaced fractures of the RIGHT anterior sixth and seventh ribs. Additional slightly displaced fracture within the sternum. Mass like structure within the RIGHT upper extremity, at superficial depth, presumably related to patient's documented RIGHT arm AV fistula. CT ABDOMEN PELVIS FINDINGS Hepatobiliary: No focal liver abnormality identified. Gallbladder is decompressed. Pancreas: Portions of the pancreas are poorly seen but no obvious pancreatic mass or peripancreatic inflammation. Spleen: Splenic cyst versus hemangioma. Otherwise unremarkable. Adrenals/Urinary Tract: Kidneys are atrophic. Adrenal glands are unremarkable. Bladder is decompressed. Stomach/Bowel: No dilated large or small bowel loops. Stomach is unremarkable, partially decompressed. Vascular/Lymphatic: Aorta bi-iliac stent in place. No enlarged  lymph nodes identified. Reproductive: Prostate is unremarkable. Other: Moderate amount of free fluid within the abdomen and pelvis. No free intraperitoneal air. Musculoskeletal: No acute or suspicious osseous finding within the abdomen or pelvis. Ill-defined fluid/edema throughout the subcutaneous soft tissues of the chest, abdomen and pelvis indicating extensive anasarca. IMPRESSION: 1. LEFT main bronchus is completely filled with hypodense material (fluid, debris and/or extensive mucous plugging). Associated complete airspace collapse on the LEFT and moderate-sized LEFT pleural effusion. 2. Patchy nodular and ground-glass opacities throughout the RIGHT lung. Differential includes atypical pneumonias such as viral or fungal, edema related to volume overload/CHF, pulmonary hemorrhage, and aspiration. COVID-19 pneumonia could have this appearance. Additional moderate-sized RIGHT pleural effusion. 3. Endotracheal  tube is well positioned with tip above the level of the carina. 4. Cardiomegaly. 5. Moderate amount of free fluid within the abdomen and pelvis. The density measurements of this fluid are not compatible with acute hemorrhage. No abscess collection seen. No free intraperitoneal air. 6. Numerous bilateral rib fractures, as described above, most of which are nondisplaced or minimally displaced. 7. Marked anasarca. 8. Atrophic kidneys. 9. Additional chronic/incidental findings detailed above. Aortic Atherosclerosis (ICD10-I70.0). Electronically Signed   By: Franki Cabot M.D.   On: 07/14/2019 18:06   CT ANGIO CHEST PE W OR WO CONTRAST  Result Date: 06/19/2019 CLINICAL DATA:  Acute abdominal pain. History of iliac artery repair. Dyspnea. EXAM: CT ANGIOGRAPHY CHEST CT ABDOMEN AND PELVIS WITH CONTRAST TECHNIQUE: Multidetector CT imaging of the chest was performed using the standard protocol during bolus administration of intravenous contrast. Multiplanar CT image reconstructions and MIPs were obtained to evaluate the vascular anatomy. Multidetector CT imaging of the abdomen and pelvis was performed using the standard protocol during bolus administration of intravenous contrast. CONTRAST:  176mL OMNIPAQUE IOHEXOL 350 MG/ML SOLN COMPARISON:  05/24/2019 FINDINGS: CTA CHEST FINDINGS Cardiovascular: Contrast injection is sufficient to demonstrate satisfactory opacification of the pulmonary arteries to the segmental level. There is no pulmonary embolus. The main pulmonary artery is dilated measuring approximately 4 cm in diameter. Heart size is enlarged. There is no significant pericardial effusion. Coronary artery calcifications are noted. There are atherosclerotic changes of the thoracic aorta without evidence for an aneurysm. There is a left IJ central venous catheter with tip terminating in the SVC. Mediastinum/Nodes: --No mediastinal or hilar lymphadenopathy. --No axillary lymphadenopathy. --No supraclavicular  lymphadenopathy. --Normal thyroid gland. --The esophagus is unremarkable Lungs/Pleura: There are small bilateral pleural effusions. There is adjacent atelectasis. There is no pneumothorax. Musculoskeletal: No chest wall abnormality. No acute or significant osseous findings. Review of the MIP images confirms the above findings. CT ABDOMEN and PELVIS FINDINGS Hepatobiliary: Again noted are findings of cirrhosis. There is cholelithiasis with mild gallbladder wall thickening and pericholecystic free fluid.There is no biliary ductal dilation. Pancreas: Normal contours without ductal dilatation. No peripancreatic fluid collection. Spleen: There are new wedge-shaped defects involving the spleen measuring up to approximately 3 cm. Adrenals/Urinary Tract: --Adrenal glands: No adrenal hemorrhage. --Right kidney/ureter: The right kidney is atrophic without evidence for hydronephrosis. --Left kidney/ureter: Again identified is a hyperdense lesion arising from the posterior interpolar region of the left kidney measuring approximately 1.2 cm. There is no left-sided hydronephrosis. --Urinary bladder: The urinary bladder is significantly distended. Stomach/Bowel: --Stomach/Duodenum: No hiatal hernia or other gastric abnormality. Normal duodenal course and caliber. --Small bowel: No dilatation or inflammation. --Colon: No focal abnormality. --Appendix: Normal. 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 This is essentially  stable since 07/02/2017. There is no clear stenosis at the origin of the celiac axis. The splenic artery appears to be grossly patent. 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 The graft is grossly patent. The aneurysm surrounding the left common iliac  artery is stable measuring approximately 3.8 cm (axial series 5, image 49), previously measuring 3.8 cm on November 09, 2018. Embolization coils are noted in the left internal iliac artery. There is a partially visualized right AV fistula that appears grossly patent. --No retroperitoneal lymphadenopathy. --No mesenteric lymphadenopathy. --No pelvic or inguinal lymphadenopathy. Reproductive: Prostate gland is mildly enlarged. Other: There is mild diffuse body wall edema. There is a trace amount of free fluid in the patient's abdomen and pelvis. Musculoskeletal. Again noted are findings of renal osteodystrophy. There is no displaced fracture. Review of the MIP images confirms the above findings. IMPRESSION: 1. No acute pulmonary embolism. 2. Small bilateral pleural effusions with adjacent atelectasis. 3. Dilated main pulmonary artery, which may be secondary to pulmonary arterial hypertension. 4. Interval development of wedge-shaped defects involving the spleen, which may represent splenic infarcts. 5. Cholelithiasis with mild gallbladder wall thickening and pericholecystic free fluid, which may be secondary to underlying liver disease. If there is clinical concern for acute cholecystitis, follow-up with ultrasound is recommended. 6. 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 7. 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 8. Additional chronic findings as detailed above. 9. Aortic Atherosclerosis (ICD10-I70.0). Electronically Signed   By: Constance Holster M.D.   On: 06/19/2019 21:02   CT PELVIS W CONTRAST  Result Date: 06/21/2019 CLINICAL DATA:  Vesicointestinal fistula.  Rectal contrast. EXAM: CT PELVIS WITH CONTRAST TECHNIQUE: Multidetector CT imaging of the pelvis was performed using the standard protocol following the bolus administration of intravenous contrast. CONTRAST:  165mL OMNIPAQUE IOHEXOL 300 MG/ML  SOLN  COMPARISON:  Abdomen and pelvis CT from 2 days ago FINDINGS: Urinary Tract: No contrast is seen in the urinary bladder which is diffusely thick walled with reticulated appearance. Bowel: Rectal contrast was administered. There is mesorectal fat stranding which was also seen on prior. The underlying wall is not thickened and this may be from total-body volume overload. Vascular/Lymphatic: Aorto bi-iliac stenting with left hypogastric artery embolization. Where covered there is no visible aneurysmal sac enhancement. No acute vascular finding. Reproductive:  Enlarged prostate projecting into the bladder base. Other:  Prominent anasarca. Musculoskeletal: Spondylosis with bulky endplate spurring. Sacroiliac ankylosis with spurring. IMPRESSION: 1. Negative for colovesicular fistula. Rectal contrast only opacifies the colon. 2. Bladder wall thickening likely from chronic outlet obstruction. The prostate is enlarged. 3. Prominent anasarca. Electronically Signed   By: Monte Fantasia M.D.   On: 06/21/2019 10:46   CT ABDOMEN PELVIS W CONTRAST  Result Date: 06/19/2019 CLINICAL DATA:  Acute abdominal pain. History of iliac artery repair. Dyspnea. EXAM: CT ANGIOGRAPHY CHEST CT ABDOMEN AND PELVIS WITH CONTRAST TECHNIQUE: Multidetector CT imaging of the chest was performed using the standard protocol during bolus administration of intravenous contrast. Multiplanar CT image reconstructions and MIPs were obtained to evaluate the vascular anatomy. Multidetector CT imaging of the abdomen and pelvis was performed using the standard protocol during bolus administration of intravenous contrast. CONTRAST:  168mL OMNIPAQUE IOHEXOL 350 MG/ML SOLN COMPARISON:  05/24/2019 FINDINGS: CTA CHEST FINDINGS Cardiovascular: Contrast injection is  sufficient to demonstrate satisfactory opacification of the pulmonary arteries to the segmental level. There is no pulmonary embolus. The main pulmonary artery is dilated measuring approximately 4 cm in  diameter. Heart size is enlarged. There is no significant pericardial effusion. Coronary artery calcifications are noted. There are atherosclerotic changes of the thoracic aorta without evidence for an aneurysm. There is a left IJ central venous catheter with tip terminating in the SVC. Mediastinum/Nodes: --No mediastinal or hilar lymphadenopathy. --No axillary lymphadenopathy. --No supraclavicular lymphadenopathy. --Normal thyroid gland. --The esophagus is unremarkable Lungs/Pleura: There are small bilateral pleural effusions. There is adjacent atelectasis. There is no pneumothorax. Musculoskeletal: No chest wall abnormality. No acute or significant osseous findings. Review of the MIP images confirms the above findings. CT ABDOMEN and PELVIS FINDINGS Hepatobiliary: Again noted are findings of cirrhosis. There is cholelithiasis with mild gallbladder wall thickening and pericholecystic free fluid.There is no biliary ductal dilation. Pancreas: Normal contours without ductal dilatation. No peripancreatic fluid collection. Spleen: There are new wedge-shaped defects involving the spleen measuring up to approximately 3 cm. Adrenals/Urinary Tract: --Adrenal glands: No adrenal hemorrhage. --Right kidney/ureter: The right kidney is atrophic without evidence for hydronephrosis. --Left kidney/ureter: Again identified is a hyperdense lesion arising from the posterior interpolar region of the left kidney measuring approximately 1.2 cm. There is no left-sided hydronephrosis. --Urinary bladder: The urinary bladder is significantly distended. Stomach/Bowel: --Stomach/Duodenum: No hiatal hernia or other gastric abnormality. Normal duodenal course and caliber. --Small bowel: No dilatation or inflammation. --Colon: No focal abnormality. --Appendix: Normal. 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 This is essentially stable since  07/02/2017. There is no clear stenosis at the origin of the celiac axis. The splenic artery appears to be grossly patent. 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 The graft is grossly patent. The aneurysm surrounding the left common iliac artery is stable measuring approximately 3.8 cm (axial series 5, image 49), previously measuring 3.8 cm on November 09, 2018. Embolization coils are noted in the left internal iliac artery. There is a partially visualized right AV fistula that appears grossly patent. --No retroperitoneal lymphadenopathy. --No mesenteric lymphadenopathy. --No pelvic or inguinal lymphadenopathy. Reproductive: Prostate gland is mildly enlarged. Other: There is mild diffuse body wall edema. There is a trace amount of free fluid in the patient's abdomen and pelvis. Musculoskeletal. Again noted are findings of renal osteodystrophy. There is no displaced fracture. Review of the MIP images confirms the above findings. IMPRESSION: 1. No acute pulmonary embolism. 2. Small bilateral pleural effusions with adjacent atelectasis. 3. Dilated main pulmonary artery, which may be secondary to pulmonary arterial hypertension. 4. Interval development of wedge-shaped defects involving the spleen, which may represent splenic infarcts. 5. Cholelithiasis with mild gallbladder wall thickening and pericholecystic free fluid, which may be secondary to underlying liver disease. If there is clinical concern for acute cholecystitis, follow-up with ultrasound is recommended. 6. 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 7. 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 8. Additional chronic findings as detailed above. 9. Aortic Atherosclerosis (ICD10-I70.0). Electronically  Signed   By: Constance Holster M.D.   On: 06/19/2019 21:02   DG CHEST PORT 1 VIEW  Result Date: 2019/08/12 CLINICAL DATA:  Central line placement EXAM: PORTABLE CHEST  1 VIEW COMPARISON:  07/14/2019 FINDINGS: 0917 hours. Endotracheal tube tip is 5.9 cm above the base of the carina. Right IJ central line tip overlies the expected location of the distal SVC given the marked leftward patient rotation. No evidence for right-sided pneumothorax. Left IJ central line remains in place with tip position over the central mediastinum. A feeding tube passes into the stomach although the distal tip position is not included on the film. The cardio pericardial silhouette is enlarged. There is pulmonary vascular congestion with interval decrease in the diffuse airspace opacities suggesting improving edema. Retrocardiac collapse/consolidation with probable left pleural effusion noted with improved aeration in the left upper lung. IMPRESSION: 1. Right IJ central line tip overlies the expected location of the distal SVC given the marked leftward patient rotation. No evidence for pneumothorax. 2. Interval improvement in aeration of the left upper lung. 3. Interval improvement in pulmonary edema pattern. 4. Persistent retrocardiac collapse/consolidation with probable left pleural effusion. Electronically Signed   By: Misty Stanley M.D.   On: 07/23/2019 09:26   DG Chest Port 1 View  Result Date: 07/14/2019 CLINICAL DATA:  Acute respiratory failure with hypoxia EXAM: PORTABLE CHEST 1 VIEW COMPARISON:  None. FINDINGS: Endotracheal tube, feeding tube and central venous line unchanged. Complete opacification of the LEFT hemithorax unchanged. Abrupt truncation of the LEFT mainstem bronchus. The RIGHT lung the demonstrates airspace disease similar to comparison exam. IMPRESSION: 1. No interval change. 2. Stable support apparatus. 3. Complete opacification of the LEFT hemithorax. 4. Airspace disease in the RIGHT lung. Electronically  Signed   By: Suzy Bouchard M.D.   On: 07/14/2019 08:29   DG CHEST PORT 1 VIEW  Result Date: 07/13/2019 CLINICAL DATA:  Check endotracheal tube placement EXAM: PORTABLE CHEST 1 VIEW COMPARISON:  07/12/2018 FINDINGS: Endotracheal tube and feeding catheter as well as a left jugular central line are again seen and stable. Cardiac shadow is enlarged. Significant opacification of left hemithorax is seen likely representing a combination of mucous plugging and consolidation and pleural fluid. Patchy infiltrate is noted throughout the right lung. IMPRESSION: Increasing opacity in the left hemithorax likely representing a combination of mucous plugging with consolidation and pleural effusion. Diffuse infiltrate is noted throughout the right lung stable from the prior study. Electronically Signed   By: Inez Catalina M.D.   On: 07/13/2019 20:45   DG CHEST PORT 1 VIEW  Result Date: 07/12/2019 CLINICAL DATA:  Code blue. Endotracheal tube. EXAM: PORTABLE CHEST 1 VIEW COMPARISON:  Radiograph earlier this day, 3.5 hours ago. FINDINGS: Endotracheal tube 5.7 cm from the carina. Enteric tube in place with tip below the diaphragm. Left central line remains in place, projecting over the upper mediastinum. Cardiomegaly. Retrocardiac opacity likely combination of pleural fluid and airspace disease. Diffuse increased opacity throughout the right hemithorax may represent asymmetric edema, layering effusion, or multifocal airspace disease. There is likely at least an element of pleural fluid noted at the apex. No visualized pneumothorax. IMPRESSION: 1. Endotracheal tube 5.7 cm from the carina. Enteric tube in place with tip below the diaphragm. 2. Left central line tip projects over the upper mediastinum, exact location uncertain. Discussion with patient's nurse demonstrates venous return, tip is likely there 4 in the distal brachiocephalic vein. 3. Stable cardiomegaly. Diffuse increased opacity throughout the right hemithorax may  represent asymmetric edema, layering effusion, or multifocal airspace disease. Overall appearance minimally progressed from prior exam. Unchanged retrocardiac opacity and probable pulmonary edema in the left lung. These results were called by telephone per  request at the time of interpretation on 07/12/2019 at 5:34 pm to patient's nurse Delsa Sale, who verbally acknowledged these results. Electronically Signed   By: Keith Rake M.D.   On: 07/12/2019 17:34   DG CHEST PORT 1 VIEW  Result Date: 07/12/2019 CLINICAL DATA:  Pulmonary edema. EXAM: PORTABLE CHEST 1 VIEW COMPARISON:  July 11, 2019 FINDINGS: Diffuse opacity throughout the right chest. Probable layering effusion on the right. The opacity on the right is more prominent towards the right base. Possible tiny left effusion. Stable cardiomegaly. The feeding tube terminates below today's film. There is a new left central line. Evaluating the location of the distal tip is limited due to patient rotation. No pneumothorax. IMPRESSION: 1. There is a new left central line. Evaluating the location of the distal tip is limited due to patient rotation. No pneumothorax. 2. Increasing opacity in the right chest may represent asymmetric edema. Developing infiltrate, particularly in the bases not excluded. Recommend clinical correlation. 3. No other changes. Electronically Signed   By: Dorise Bullion III M.D   On: 07/12/2019 14:10   DG Chest Port 1 View  Result Date: 07/11/2019 CLINICAL DATA:  Tachypnea. History of congestive heart failure, end-stage renal disease and hypertension. EXAM: PORTABLE CHEST 1 VIEW COMPARISON:  Radiographs 06/25/2019, 06/19/2019 and 10/09/2004. CT 06/19/2019 and 06/30/2019. FINDINGS: 1413 hours. Feeding tube projects below the diaphragm, tip not visualized. There is stable cardiomegaly and aortic atherosclerosis. The pulmonary vasculature is ill-defined, and there are increased diffuse pulmonary opacities bilaterally, greatest in the right upper  lobe. There are small bilateral pleural effusions. No pneumothorax or acute osseous findings. IMPRESSION: Worsening bilateral pulmonary opacities and pleural effusions most consistent with worsening congestive heart failure and/or volume overload. Electronically Signed   By: Richardean Sale M.D.   On: 07/11/2019 14:28   DG Chest Port 1 View  Result Date: 06/25/2019 CLINICAL DATA:  Abnormal respiration EXAM: PORTABLE CHEST 1 VIEW COMPARISON:  06/19/2019 chest radiograph. FINDINGS: Left internal jugular central venous catheter terminates at the cavoatrial junction. Stable cardiomediastinal silhouette with mild cardiomegaly. No pneumothorax. Stable small left and trace right pleural effusions. Borderline mild pulmonary edema, improved. Bibasilar atelectasis, left greater than right, unchanged. Partially visualized abdominal aortic stent graft. IMPRESSION: 1. Borderline mild congestive heart failure, improved. 2. Stable small left and trace right pleural effusions with bibasilar atelectasis. Electronically Signed   By: Ilona Sorrel M.D.   On: 06/25/2019 05:32   DG CHEST PORT 1 VIEW  Result Date: 06/19/2019 CLINICAL DATA:  Status post central line placement EXAM: PORTABLE CHEST 1 VIEW COMPARISON:  Film from earlier in the same day. FINDINGS: Cardiac shadow is enlarged but stable. New left jugular central line is noted at the cavoatrial junction. Vascular congestion is noted with increasing pulmonary edema. No focal confluent infiltrate is seen. No bony abnormality is noted. No pneumothorax is noted. IMPRESSION: Vascular congestion with increasing edema. No pneumothorax following central line placement. Electronically Signed   By: Inez Catalina M.D.   On: 06/19/2019 17:20   DG Chest Port 1 View  Result Date: 06/19/2019 CLINICAL DATA:  Fever.  Lethargic.  Dialysis patient. EXAM: PORTABLE CHEST 1 VIEW COMPARISON:  07/27/2017 FINDINGS: Chronic cardiomegaly and aortic atherosclerosis. Pulmonary venous hypertension  without frank edema. Chronic elevation of the left hemidiaphragm with chronic left base volume loss. No acute bone finding. Previous surgical clips at the thoracic inlet. IMPRESSION: Cardiomegaly.  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 Chronic elevation of the left hemidiaphragm with chronic volume loss at the  left lung base. Electronically Signed   By: Nelson Chimes M.D.   On: 06/19/2019 00:38   ECHOCARDIOGRAM COMPLETE  Result Date: 06/19/2019    ECHOCARDIOGRAM REPORT   Patient Name:   DARREK LEASURE Date of Exam: 06/19/2019 Medical Rec #:  [id number]       Height:       76.0 in Accession #:    [id number]      Weight:       160.5 lb Date of Birth:  09/11/1950        BSA:          2.02 m Patient Age:    80 years        BP:           67/45 mmHg Patient Gender: M               HR:           95 bpm. Exam Location:  Inpatient Procedure: 2D Echo, Cardiac Doppler and Color Doppler Indications:    Hypotension  History:        Patient has prior history of Echocardiogram examinations, most                 recent 05/15/2016. Risk Factors:Hypertension and Non-Smoker.  Sonographer:    Vickie Epley RDCS Referring Phys: [id number] [place]  1. Left ventricular ejection fraction, by estimation, is 60 to 65%. The left ventricle has normal function. The left ventricle has no regional wall motion abnormalities. Left ventricular diastolic parameters are indeterminate.  2. Right ventricular systolic function is normal. The right ventricular size is normal.  3. Left atrial size was severely dilated.  4. Right atrial size was mildly dilated.  5. The mitral valve is degenerative. Mild mitral valve regurgitation. No evidence of mitral stenosis.  6. The aortic valve is tricuspid. Aortic valve regurgitation is mild to moderate. Mild aortic valve stenosis.  7. The inferior vena cava is normal in size with greater than 50% respiratory variability, suggesting right atrial pressure of 3 mmHg. FINDINGS  Left Ventricle:  Left ventricular ejection fraction, by estimation, is 60 to 65%. The left ventricle has normal function. The left ventricle has no regional wall motion abnormalities. The left ventricular internal cavity size was normal in size. There is  no left ventricular hypertrophy. Left ventricular diastolic parameters are indeterminate. Right Ventricle: The right ventricular size is normal. No increase in right ventricular wall thickness. Right ventricular systolic function is normal. Left Atrium: Left atrial size was severely dilated. Right Atrium: Right atrial size was mildly dilated. Pericardium: There is no evidence of pericardial effusion. Mitral Valve: The mitral valve is degenerative in appearance. There is moderate thickening of the mitral valve leaflet(s). There is moderate calcification of the mitral valve leaflet(s). Normal mobility of the mitral valve leaflets. Severe mitral annular  calcification. Mild mitral valve regurgitation. No evidence of mitral valve stenosis. Tricuspid Valve: The tricuspid valve is normal in structure. Tricuspid valve regurgitation is trivial. No evidence of tricuspid stenosis. Aortic Valve: The aortic valve is tricuspid. . There is severe thickening and severe calcifcation of the aortic valve. Aortic valve regurgitation is mild to moderate. Aortic regurgitation PHT measures 201 msec. Mild aortic stenosis is present. Severe aortic valve annular calcification. There is severe thickening of the aortic valve. There is severe calcifcation of the aortic valve. Aortic valve mean gradient measures 12.3 mmHg. Aortic valve peak gradient measures 19.7 mmHg. Aortic valve area, by VTI measures 2.13  cm. Pulmonic Valve: The pulmonic valve was normal in structure. Pulmonic valve regurgitation is mild. No evidence of pulmonic stenosis. Aorta: The aortic root is normal in size and structure. Venous: The inferior vena cava is normal in size with greater than 50% respiratory variability, suggesting right  atrial pressure of 3 mmHg. IAS/Shunts: No atrial level shunt detected by color flow Doppler.  LEFT VENTRICLE PLAX 2D LVIDd:         5.60 cm      Diastology LVIDs:         4.20 cm      LV e' lateral:   7.62 cm/s LV PW:         0.60 cm      LV E/e' lateral: 21.8 LV IVS:        0.60 cm      LV e' medial:    7.29 cm/s LVOT diam:     2.40 cm      LV E/e' medial:  22.8 LV SV:         99.98 ml LV SV Index:   38.29 LVOT Area:     4.52 cm  LV Volumes (MOD) LV vol d, MOD A2C: 148.0 ml LV vol d, MOD A4C: 140.0 ml LV vol s, MOD A2C: 74.7 ml LV vol s, MOD A4C: 71.9 ml LV SV MOD A2C:     73.3 ml LV SV MOD A4C:     140.0 ml LV SV MOD BP:      72.3 ml RIGHT VENTRICLE RV S prime:     15.70 cm/s TAPSE (M-mode): 1.9 cm LEFT ATRIUM              Index       RIGHT ATRIUM           Index LA diam:        5.40 cm  2.68 cm/m  RA Area:     26.60 cm LA Vol (A2C):   164.0 ml 81.29 ml/m RA Volume:   82.00 ml  40.65 ml/m LA Vol (A4C):   156.0 ml 77.33 ml/m LA Biplane Vol: 165.0 ml 81.79 ml/m  AORTIC VALVE AV Area (Vmax):    1.91 cm AV Area (Vmean):   1.81 cm AV Area (VTI):     2.13 cm AV Vmax:           222.00 cm/s AV Vmean:          166.667 cm/s AV VTI:            0.469 m AV Peak Grad:      19.7 mmHg AV Mean Grad:      12.3 mmHg LVOT Vmax:         93.80 cm/s LVOT Vmean:        66.700 cm/s LVOT VTI:          0.221 m LVOT/AV VTI ratio: 0.47 AI PHT:            201 msec  AORTA Ao Root diam: 3.40 cm MITRAL VALVE MV Area (PHT): 6.27 cm     SHUNTS MV Decel Time: 121 msec     Systemic VTI:  0.22 m MV E velocity: 166.00 cm/s  Systemic Diam: 2.40 cm MV A velocity: 49.40 cm/s MV E/A ratio:  3.36 Jenkins Rouge MD Electronically signed by Jenkins Rouge MD Signature Date/Time: 06/19/2019/11:06:09 AM    Final    VAS [place] LOWER EXTREMITY VENOUS (DVT)  Result Date: 06/30/2019  Lower Venous DVTStudy Indications: Elevated  Ddimer.  Risk Factors: None identified. Comparison Study: No prior studies. Performing Technologist: Oliver Hum RVT  Examination  Guidelines: A complete evaluation includes B-mode imaging, spectral Doppler, color Doppler, and power Doppler as needed of all accessible portions of each vessel. Bilateral testing is considered an integral part of a complete examination. Limited examinations for reoccurring indications may be performed as noted. The reflux portion of the exam is performed with the patient in reverse Trendelenburg.  +---------+---------------+---------+-----------+----------+--------------+ RIGHT    CompressibilityPhasicitySpontaneityPropertiesThrombus Aging +---------+---------------+---------+-----------+----------+--------------+ CFV      Full           Yes      Yes                                 +---------+---------------+---------+-----------+----------+--------------+ SFJ      Full                                                        +---------+---------------+---------+-----------+----------+--------------+ FV Prox  Full                                                        +---------+---------------+---------+-----------+----------+--------------+ FV Mid   Full                                                        +---------+---------------+---------+-----------+----------+--------------+ FV DistalFull                                                        +---------+---------------+---------+-----------+----------+--------------+ PFV      Full                                                        +---------+---------------+---------+-----------+----------+--------------+ POP      Full           Yes      Yes                                 +---------+---------------+---------+-----------+----------+--------------+ PTV      Full                                                        +---------+---------------+---------+-----------+----------+--------------+ PERO     Full                                                         +---------+---------------+---------+-----------+----------+--------------+   +---------+---------------+---------+-----------+----------+--------------+  LEFT     CompressibilityPhasicitySpontaneityPropertiesThrombus Aging +---------+---------------+---------+-----------+----------+--------------+ CFV      Full           Yes      Yes                                 +---------+---------------+---------+-----------+----------+--------------+ SFJ      Full                                                        +---------+---------------+---------+-----------+----------+--------------+ FV Prox  Full                                                        +---------+---------------+---------+-----------+----------+--------------+ FV Mid   Full                                                        +---------+---------------+---------+-----------+----------+--------------+ FV DistalFull                                                        +---------+---------------+---------+-----------+----------+--------------+ PFV      Full                                                        +---------+---------------+---------+-----------+----------+--------------+ POP      Full           Yes      Yes                                 +---------+---------------+---------+-----------+----------+--------------+ PTV      Full                                                        +---------+---------------+---------+-----------+----------+--------------+ PERO     Full                                                        +---------+---------------+---------+-----------+----------+--------------+     Summary: RIGHT: - There is no evidence of deep vein thrombosis in the lower extremity.  - A cystic structure is found in the popliteal fossa.  LEFT: - There is no evidence of deep vein thrombosis in the lower extremity.  - A  cystic structure is found in the popliteal  fossa.  *See table(s) above for measurements and observations. Electronically signed by Monica Martinez MD on 06/30/2019 at 7:08:24 PM.    Final    VAS [place] UPPER EXTREMITY VENOUS DUPLEX  Result Date: 06/30/2019 UPPER VENOUS STUDY  Indications: Swelling Risk Factors: Right sided dialysis access. Limitations: Poor ultrasound/tissue interface. Comparison Study: No prior studies. Performing Technologist: Oliver Hum RVT  Examination Guidelines: A complete evaluation includes B-mode imaging, spectral Doppler, color Doppler, and power Doppler as needed of all accessible portions of each vessel. Bilateral testing is considered an integral part of a complete examination. Limited examinations for reoccurring indications may be performed as noted.  Right Findings: +----------+------------+---------+-----------+----------+-------+ RIGHT     CompressiblePhasicitySpontaneousPropertiesSummary +----------+------------+---------+-----------+----------+-------+ Subclavian    Full       Yes       Yes                      +----------+------------+---------+-----------+----------+-------+  Left Findings: +----------+------------+---------+-----------+----------+--------------+ LEFT      CompressiblePhasicitySpontaneousProperties   Summary     +----------+------------+---------+-----------+----------+--------------+ IJV                                                 Not visualized +----------+------------+---------+-----------+----------+--------------+ Subclavian    Full       Yes       Yes                             +----------+------------+---------+-----------+----------+--------------+ Axillary      Full       Yes       Yes                             +----------+------------+---------+-----------+----------+--------------+ Brachial      Full       Yes       Yes                             +----------+------------+---------+-----------+----------+--------------+ Radial         Full                                                 +----------+------------+---------+-----------+----------+--------------+ Ulnar         Full                                                 +----------+------------+---------+-----------+----------+--------------+ Cephalic      Full                                                 +----------+------------+---------+-----------+----------+--------------+ Basilic       Full                                                 +----------+------------+---------+-----------+----------+--------------+  Unable to visualize the IJV due to line and bandages.  Summary:  Right: No evidence of thrombosis in the subclavian.  Left: No evidence of deep vein thrombosis in the upper extremity. No evidence of superficial vein thrombosis in the upper extremity.  *See table(s) above for measurements and observations.  Diagnosing physician: Monica Martinez MD Electronically signed by Monica Martinez MD on 06/30/2019 at 7:05:42 PM.    Final    ECHOCARDIOGRAM LIMITED  Result Date: August 04, 2019    ECHOCARDIOGRAM LIMITED REPORT   Patient Name:   NASARIO CZERNIAK Date of Exam: Aug 04, 2019 Medical Rec #:  [id number]       Height:       73.0 in Accession #:    [id number]      Weight:       206.8 lb Date of Birth:  May 23, 1950        BSA:          2.182 m Patient Age:    74 years        BP:           86/37 mmHg Patient Gender: M               HR:           96 bpm. Exam Location:  Inpatient Procedure: Limited Echo, Color Doppler and Cardiac Doppler Indications:    shock  History:        Patient has prior history of Echocardiogram examinations, most                 recent 06/19/2019. Cardiomyopathy, end stage renal disease; Risk                 Factors:Hypertension.  Sonographer:    Johny Chess Referring Phys: [id number] Patches Mcdonnell JANE Adriella Essex  Sonographer Comments: Echo performed with patient supine and on artificial respirator. IMPRESSIONS  1. Left ventricular ejection  fraction, by estimation, is 55 to 60%. The left ventricle has normal function.  2. The mitral valve is abnormal. Mild to moderate mitral valve regurgitation.  3. Tricuspid valve regurgitation is mild to moderate.  4. The aortic valve is tricuspid. Aortic valve regurgitation is moderate. Mild aortic valve stenosis. FINDINGS  Left Ventricle: Left ventricular ejection fraction, by estimation, is 55 to 60%. The left ventricle has normal function. The left ventricular internal cavity size was normal in size. Pericardium: There is no evidence of pericardial effusion. Mitral Valve: The mitral valve is abnormal. There is moderate thickening of the mitral valve leaflet(s). Moderate mitral annular calcification. Mild to moderate mitral valve regurgitation. Tricuspid Valve: The tricuspid valve is normal in structure. Tricuspid valve regurgitation is mild to moderate. No evidence of tricuspid stenosis. Aortic Valve: The aortic valve is tricuspid. . There is moderate thickening and mild calcification of the aortic valve. Aortic valve regurgitation is moderate. Aortic regurgitation PHT measures 141 msec. Mild aortic stenosis is present. Moderate aortic valve annular calcification. There is moderate thickening of the aortic valve. There is mild calcification of the aortic valve. Aortic valve mean gradient measures 17.0 mmHg. Aortic valve peak gradient measures 30.0 mmHg. Aortic valve area, by VTI measures 2.88 cm. Pulmonic Valve: The pulmonic valve was normal in structure. Pulmonic valve regurgitation is mild to moderate. No evidence of pulmonic stenosis. Aorta: The aortic root and ascending aorta are structurally normal, with no evidence of dilitation.  LEFT VENTRICLE PLAX 2D LVIDd:         6.00 cm LVIDs:  4.90 cm LV PW:         1.10 cm LV IVS:        1.00 cm LVOT diam:     2.80 cm LV SV:         135 LV SV Index:   62 LVOT Area:     6.16 cm  LEFT ATRIUM         Index LA diam:    5.30 cm 2.43 cm/m  AORTIC VALVE AV Area  (Vmax):    2.82 cm AV Area (Vmean):   2.62 cm AV Area (VTI):     2.88 cm AV Vmax:           274.00 cm/s AV Vmean:          190.000 cm/s AV VTI:            0.469 m AV Peak Grad:      30.0 mmHg AV Mean Grad:      17.0 mmHg LVOT Vmax:         125.50 cm/s LVOT Vmean:        80.700 cm/s LVOT VTI:          0.220 m LVOT/AV VTI ratio: 0.47 AI PHT:            141 msec  AORTA Ao Root diam: 3.60 cm Ao Asc diam:  3.40 cm MITRAL VALVE MV Area (PHT): 4.89 cm     SHUNTS MV Decel Time: 155 msec     Systemic VTI:  0.22 m MR Peak grad: 67.9 mmHg     Systemic Diam: 2.80 cm MR Mean grad: 49.0 mmHg MR Vmax:      412.00 cm/s MR Vmean:     339.0 cm/s MV E velocity: 186.00 cm/s MV A velocity: 73.20 cm/s MV E/A ratio:  2.54 Mertie Moores MD Electronically signed by Mertie Moores MD Signature Date/Time: 07-28-19/12:17:59 PM    Final    US Abdomen Limited RUQ  Result Date: 06/20/2019 CLINICAL DATA:  69 year old male with abdominal pain, abnormal CT appearance of the gallbladder yesterday. EXAM: ULTRASOUND ABDOMEN LIMITED RIGHT UPPER QUADRANT COMPARISON:  CTA chest, abdomen and pelvis 06/19/2019. FINDINGS: Gallbladder: The gallbladder appears similarly distended, with sludge in the lumen (image 5). No shadowing echogenic stones identified. There is non concentric appearing gallbladder wall thickening up to 6 mm (image 7). No pericholecystic fluid is evident. No sonographic Percell Miller sign was elicited. Common bile duct: Diameter: 7-8 mm, upper limits of normal to mildly dilated. The CBD is visible to the pancreatic head (image 19) with no obvious filling defect. Liver: No intrahepatic ductal dilatation. Liver echogenicity within normal limits. No discrete liver lesion. Portal vein is patent on color Doppler imaging with normal direction of blood flow towards the liver. Other: Atrophic right kidney better demonstrated by CT. IMPRESSION: 1. Both the gallbladder and the CBD appear mildly distended with sludge but no stone identified. No  intrahepatic biliary ductal dilatation to strongly suggest acute bile duct obstruction. Some gallbladder wall thickening, although no sonographic Murphy sign elicited to strongly suggest acalculous cholecystitis. 2. If abdominal pain continues then a nuclear medicine hepatobiliary scan would be valuable to exclude acute CBD or cystic duct obstruction. Electronically Signed   By: Genevie Ann M.D.   On: 06/20/2019 23:50    Microbiology Recent Results (from the past 240 hour(s))  Culture, respiratory (non-expectorated)     Status: None   Collection Time: 07/13/19  9:09 AM   Specimen: Tracheal Aspirate; Respiratory  Result  Value Ref Range Status   Specimen Description TRACHEAL ASPIRATE  Final   Special Requests NONE  Final   Gram Stain   Final    RARE WBC PRESENT, PREDOMINANTLY PMN RARE SQUAMOUS EPITHELIAL CELLS PRESENT ABUNDANT GRAM VARIABLE ROD Performed at [facility], [place] [address] [place], [place] [id number]    Culture MODERATE PSEUDOMONAS AERUGINOSA  Final   Report Status 07/16/2019 FINAL  Final   Organism ID, Bacteria PSEUDOMONAS AERUGINOSA  Final      Susceptibility   Pseudomonas aeruginosa - MIC*    CEFTAZIDIME <=1 SENSITIVE Sensitive     CIPROFLOXACIN <=0.25 SENSITIVE Sensitive     GENTAMICIN <=1 SENSITIVE Sensitive     IMIPENEM 1 SENSITIVE Sensitive     PIP/TAZO <=4 SENSITIVE Sensitive     CEFEPIME 0.5 SENSITIVE Sensitive     * MODERATE PSEUDOMONAS AERUGINOSA  MRSA PCR Screening     Status: None   Collection Time: 07/13/19  9:16 AM   Specimen: Nasal Mucosa; Nasopharyngeal  Result Value Ref Range Status   MRSA by PCR NEGATIVE NEGATIVE Final    Comment:        The GeneXpert MRSA Assay (FDA approved for NASAL specimens only), is one component of a comprehensive MRSA colonization surveillance program. It is not intended to diagnose MRSA infection nor to guide or monitor treatment for MRSA infections. Performed at [facility], [place] [address], [place],  [place] [address]   Culture, blood (Routine X 2) w Reflex to ID Panel     Status: None (Preliminary result)   Collection Time: 07/13/19 10:27 AM   Specimen: BLOOD LEFT HAND  Result Value Ref Range Status   Specimen Description BLOOD LEFT HAND  Final   Special Requests   Final    AEROBIC BOTTLE ONLY Blood Culture results may not be optimal due to an inadequate volume of blood received in culture bottles   Culture   Final    NO GROWTH 3 DAYS Performed at [facility], [place] [address], [place], [place] [address]    Report Status PENDING  Incomplete  Culture, blood (Routine X 2) w Reflex to ID Panel     Status: None (Preliminary result)   Collection Time: 07/13/19 10:56 AM   Specimen: BLOOD LEFT HAND  Result Value Ref Range Status   Specimen Description BLOOD LEFT HAND  Final   Special Requests   Final    AEROBIC BOTTLE ONLY Blood Culture results may not be optimal due to an inadequate volume of blood received in culture bottles   Culture   Final    NO GROWTH 3 DAYS Performed at [facility], [place] [address], [place], [place] [address]    Report Status PENDING  Incomplete    Lab Basic Metabolic Panel: Recent Labs  Lab 07/11/19 0715 07/11/19 0715 07/12/19 0546 07/12/19 0546 07/12/19 1430 07/12/19 2053 07/13/19 0827 07/14/19 0412 07/14/19 0539 08/08/19 0858 08/08/19 0933 August 08, 2019 1533 08/08/2019 1759  NA 134*   < > 137   < > 136   < > 138   < > 139 140 141 141 139  K 3.8   < > 3.7   < > 3.1*   < > 3.8   < > 3.5 3.7 4.1 3.9 4.2  CL 96*   < > 98   < > 99  --  104  --  105  --  110 110  --   CO2 22   < >  25   < > 25  --  14*  --  18*  --  16* 16*  --   GLUCOSE 133*   < > 104*   < > 104*  --  159*  --  140*  --  162* 187*  --   BUN 145*   < > 96*   < > 108*  --  109*  --  128*  --  159* 135*  --   CREATININE 4.82*   < > 3.46*   < > 3.74*  --  3.91*  --  4.26*  --  5.25* 4.10*  --   CALCIUM 7.9*   < > 8.4*   < > 8.0*  --  8.2*  --  7.7*  --  8.0* 7.9*  --   MG  --   --  1.9   --   --   --  2.3  --  2.4  --  2.5*  --   --   PHOS 4.6  --  3.7  --  3.9  --   --   --   --   --   --  5.1*  --    < > = values in this interval not displayed.   Liver Function Tests: Recent Labs  Lab 07/12/19 1430 07/13/19 0827 07/14/19 1234 August 06, 2019 0933 08/06/19 1533  AST  --  7,169* 1,444* 328*  --   ALT  --  2,142* 1,404* 859*  --   ALKPHOS  --  110 120 109  --   BILITOT  --  1.8* 1.7* 1.0  --   PROT  --  4.1* 4.0* 3.9*  --   ALBUMIN 2.5* 1.9* 1.8* 1.9* 2.0*   No results for input(s): LIPASE, AMYLASE in the last 168 hours. No results for input(s): AMMONIA in the last 168 hours. CBC: Recent Labs  Lab 07/12/19 1430 07/12/19 2053 07/13/19 0827 07/14/19 0412 07/14/19 0539 07/14/19 1234 08-06-2019 0858 Aug 06, 2019 0933 Aug 06, 2019 1759  WBC 15.3*  --  31.0*  --  21.0* 20.5*  --  19.2*  --   HGB 7.3*   < > 9.5*   < > 8.5* 8.1* 7.8* 8.1* 8.8*  HCT 22.9*   < > 30.4*   < > 26.7* 25.4* 23.0* 25.8* 26.0*  MCV 92.0  --  96.8  --  95.7 95.5  --  98.1  --   PLT 57*  --  43*  --  40* 42*  --  47*  --    < > = values in this interval not displayed.   Cardiac Enzymes: No results for input(s): CKTOTAL, CKMB, CKMBINDEX, TROPONINI in the last 168 hours. Sepsis Labs: Recent Labs  Lab 07/13/19 0827 07/13/19 1056 07/14/19 0539 07/14/19 1234 August 06, 2019 0933  WBC 31.0*  --  21.0* 20.5* 19.2*  LATICACIDVEN  --  7.0* 1.6  --   --     Procedures/Operations  3/9 ETT 2/14 L IJ HD catheter 3/12 R CVC 3/12 R arterial line   Dub Maclellan Rodman Pickle 07/16/2019, 2:12 PM

## 2019-08-04 NOTE — Progress Notes (Signed)
  Echocardiogram 2D Echocardiogram has been performed.  Gregory [name] 2019/07/27, 10:40 AM

## 2019-08-04 NOTE — Progress Notes (Signed)
Pt BPs still in 80s despite neo and levo maxed out. (400 and 100, respectively). MD notified.

## 2019-08-04 NOTE — Progress Notes (Signed)
Patient's heart rhythm switching in and out of a bigeminy rhythm. [place] notified. Patient is lab stick so lab notified to draw blood work asap. Will continue to monitor.

## 2019-08-04 NOTE — Procedures (Signed)
Central Venous Catheter Insertion Procedure Note Gregory [name] [id number] 05/08/1950  Procedure: Insertion of Central Venous Catheter Indications: Drug and/or fluid administration and Frequent blood sampling  Procedure Details Consent: Risks of procedure as well as the alternatives and risks of each were explained to the (patient/caregiver).  Consent for procedure obtained. Time Out: Verified patient identification, verified procedure, site/side was marked, verified correct patient position, special equipment/implants available, medications/allergies/relevent history reviewed, required imaging and test results available.  Performed  Maximum sterile technique was used including antiseptics, cap, gloves, gown, hand hygiene, mask and sheet. Skin prep: Chlorhexidine; local anesthetic administered A antimicrobial bonded/coated triple lumen catheter was placed in the right internal jugular vein using the Seldinger technique.  Evaluation Blood flow good Complications: No apparent complications Patient did tolerate procedure well. Chest X-ray ordered to verify placement.  CXR: pending.  Gregory [name] 2019-08-13, 9:06 AM

## 2019-08-04 NOTE — Progress Notes (Signed)
PT Cancellation Note  Patient Details Name: Gregory [name] MRN: [id number] DOB: 1950-08-22   Cancelled Treatment:    Reason Eval/Treat Not Completed: Medical issues which prohibited therapy; patient intubated with multiple medical issues.  Will cancel PT today and check back another day.   Reginia Naas 07/28/19, 8:40 AM  Magda Kiel, [place] [phone] 2019/07/28

## 2019-08-04 NOTE — Progress Notes (Signed)
   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 He was transferred out of ICU and to [facility] on 2/23.Returned to ICU on2/25 for recurrent hypotension. Has also developed severe pneumonia- L hemithorax completely opacified. Cardiac arrest 3/9.  Today's Discussion (08/04/2019): Chart reviewed. Met with RT and nursing staff at bedside. Per nursing staff patient has been weaning down on pressors epi has been stopped completely remains on neo/vaso/levo. He is appearing stable enough today to resume CRRT.   Discussed the importance of continued conversation with family and their  medical providers regarding overall plan of care and treatment options, ensuring decisions are within the context of the patients values and GOCs.  Questions and concerns addressed   Subjective Assessment: Patient is intubated he has his eyes open, he is not following commands.   Vital Signs Vitals:   2019/08/04 1200 04-Aug-2019 1513  BP:  (!) 100/43  Pulse:  100  Resp:  (!) 21  Temp: 98.4 F (36.9 C)   SpO2:  93%    Intake/Output Summary (Last 24 hours) at 08/04/2019 1622 Last data filed at 08/04/19 1600 Gross per 24 hour  Intake 2322.08 ml  Output 531 ml  Net 1791.08 ml   Last Weight  Most recent update: August 04, 2019  5:36 AM   Weight  93.8 kg (206 lb 12.7 oz)          General:Ill appearing AA M Heart: regular rhythm, no murmurs, rubs or gallops Lungs:Ventilated,  (+) rales bilaterally Abdomen:Soft, non-tender, non-distended, +BS Extremities:Edema noted in all extremities (+)2-4   Recommendations and Plan:  PMT will continue to follow and discuss [place] with spouse  Medical team will  likely need to set limits if/when necessary  CRRT resumption today  DNR, Yes to intubation (current)  Time Spent: 25 Greater than 50% of the time was spent in counseling and coordination of care ______________________________________________________________________________________ [place] Team Team Cell Phone: [phone] Please utilize secure chat with additional questions, if there is no response within 30 minutes please call the above phone number  Palliative Medicine Team providers are available by phone from 7am to 7pm daily and can be reached through the team cell phone.  Should this patient require assistance outside of these hours, please call the patient's attending physician.

## 2019-08-04 NOTE — Progress Notes (Signed)
RT unable to obtain arterial line with three attempts. Nurse made aware.

## 2019-08-04 NOTE — Progress Notes (Signed)
eLink Physician-Brief Progress Note Patient Name: Gregory [name] DOB: [birth year] MRN: [id number]   Date of Service  08/13/19  HPI/Events of Note  Patient having ventricular bigeminy. Currently on Norepinephrine IV infusion.   eICU Interventions  Will order: 1. Restart Phenylephrine IV infusion.  2. Wean Norepinephrine IV infusion as tolerated.  3. Send AM labs now.      Intervention Category Major Interventions: Arrhythmia - evaluation and management  [name] 08-13-2019, 4:58 AM

## 2019-08-04 NOTE — Progress Notes (Signed)
Lab unable to draw patient's blood work after multiple attempts and 3 different lab techs attempting. Attempted to get blood work from a finger stick and not enough blood would drain. eLink MD notified. New order to place an arterial line. Respiratory not available at this time. Will pass along to day shift RN.

## 2019-08-04 NOTE — Procedures (Signed)
Extubation Procedure Note  Patient Details:   Name: Gregory [name] DOB: 1950/11/13 MRN: [id number]   Airway Documentation:    Vent end date: Jul 27, 2019 Vent end time: 1956   Evaluation  O2 sats: transiently fell during during procedure Complications: No apparent complications Patient did not tolerate procedure well. Bilateral Breath Sounds: Rhonchi, Diminished   No  Terminal extubation per MD order  Boubacar Lerette R Elster Corbello 2019-07-27, 8:01 PM

## 2019-08-04 NOTE — Procedures (Signed)
Arterial Catheter Insertion Procedure Note Gregory [name] [id number] 1950/10/01  Procedure: Insertion of Arterial Catheter  Indications: Blood pressure monitoring and Frequent blood sampling  Procedure Details Consent: Risks of procedure as well as the alternatives and risks of each were explained to the (patient/caregiver).  Consent for procedure obtained. Time Out: Verified patient identification, verified procedure, site/side was marked, verified correct patient position, special equipment/implants available, medications/allergies/relevent history reviewed, required imaging and test results available.  Performed  Maximum sterile technique was used including antiseptics, cap, gloves, gown, hand hygiene, mask and sheet. Skin prep: Chlorhexidine; local anesthetic administered 20 gauge catheter was inserted into right radial artery using the Seldinger technique. ULTRASOUND GUIDANCE USED: YES Evaluation Blood flow good; BP tracing good. Complications: No apparent complications.   Uzair Godley Rodman Pickle 07/26/2019

## 2019-08-04 DEATH — deceased
